# Patient Record
Sex: Female | Born: 1935
Health system: Southern US, Community
[De-identification: ages and names within clinical notes are randomized; demographics above are authoritative.]

## PROBLEM LIST (undated history)

## (undated) DIAGNOSIS — H353190 Nonexudative age-related macular degeneration, unspecified eye, stage unspecified: Secondary | ICD-10-CM

## (undated) DIAGNOSIS — G8929 Other chronic pain: Secondary | ICD-10-CM

## (undated) DIAGNOSIS — K529 Noninfective gastroenteritis and colitis, unspecified: Secondary | ICD-10-CM

## (undated) DIAGNOSIS — C3411 Malignant neoplasm of upper lobe, right bronchus or lung: Secondary | ICD-10-CM

## (undated) DIAGNOSIS — Z8669 Personal history of other diseases of the nervous system and sense organs: Secondary | ICD-10-CM

## (undated) DIAGNOSIS — I4891 Unspecified atrial fibrillation: Secondary | ICD-10-CM

## (undated) DIAGNOSIS — H269 Unspecified cataract: Secondary | ICD-10-CM

## (undated) DIAGNOSIS — M1990 Unspecified osteoarthritis, unspecified site: Secondary | ICD-10-CM

## (undated) DIAGNOSIS — M549 Dorsalgia, unspecified: Secondary | ICD-10-CM

## (undated) DIAGNOSIS — N200 Calculus of kidney: Secondary | ICD-10-CM

## (undated) DIAGNOSIS — Z860100 Personal history of colon polyps, unspecified: Secondary | ICD-10-CM

## (undated) DIAGNOSIS — E785 Hyperlipidemia, unspecified: Secondary | ICD-10-CM

## (undated) DIAGNOSIS — R195 Other fecal abnormalities: Secondary | ICD-10-CM

## (undated) DIAGNOSIS — E039 Hypothyroidism, unspecified: Secondary | ICD-10-CM

## (undated) HISTORY — PX: ESOPHAGOGASTRODUODENOSCOPY: SHX1529

## (undated) HISTORY — PX: COLONOSCOPY: SHX174

## (undated) HISTORY — PX: TONSILLECTOMY: SUR1361

## (undated) HISTORY — DX: Unspecified atrial fibrillation: I48.91

## (undated) HISTORY — PX: CATARACT EXTRACTION: SUR2

## (undated) HISTORY — DX: Calculus of kidney: N20.0

## (undated) HISTORY — DX: Other chronic pain: M54.9

## (undated) HISTORY — DX: Noninfective gastroenteritis and colitis, unspecified: K52.9

## (undated) HISTORY — DX: Other chronic pain: G89.29

## (undated) HISTORY — DX: Other fecal abnormalities: R19.5

## (undated) HISTORY — DX: Malignant neoplasm of upper lobe, right bronchus or lung: C34.11

## (undated) HISTORY — DX: Hypothyroidism, unspecified: E03.9

## (undated) HISTORY — DX: Unspecified cataract: H26.9

---

## 1959-05-16 HISTORY — PX: THYROID SURGERY: SHX805

## 1994-05-15 HISTORY — PX: OTHER SURGICAL HISTORY: SHX169

## 1997-10-15 ENCOUNTER — Other Ambulatory Visit: Admission: RE | Admit: 1997-10-15 | Discharge: 1997-10-15 | Payer: Self-pay | Admitting: Obstetrics and Gynecology

## 1998-11-11 ENCOUNTER — Other Ambulatory Visit: Admission: RE | Admit: 1998-11-11 | Discharge: 1998-11-11 | Payer: Self-pay | Admitting: Obstetrics and Gynecology

## 1999-09-26 ENCOUNTER — Encounter: Payer: Self-pay | Admitting: Obstetrics and Gynecology

## 1999-09-26 ENCOUNTER — Encounter: Admission: RE | Admit: 1999-09-26 | Discharge: 1999-09-26 | Payer: Self-pay | Admitting: Obstetrics and Gynecology

## 1999-10-03 ENCOUNTER — Other Ambulatory Visit: Admission: RE | Admit: 1999-10-03 | Discharge: 1999-10-03 | Payer: Self-pay | Admitting: Obstetrics and Gynecology

## 2000-10-02 ENCOUNTER — Other Ambulatory Visit: Admission: RE | Admit: 2000-10-02 | Discharge: 2000-10-02 | Payer: Self-pay | Admitting: Obstetrics and Gynecology

## 2000-10-04 ENCOUNTER — Encounter: Payer: Self-pay | Admitting: Obstetrics and Gynecology

## 2000-10-04 ENCOUNTER — Encounter: Admission: RE | Admit: 2000-10-04 | Discharge: 2000-10-04 | Payer: Self-pay | Admitting: Obstetrics and Gynecology

## 2004-06-15 ENCOUNTER — Ambulatory Visit: Payer: Self-pay | Admitting: Cardiology

## 2009-05-17 ENCOUNTER — Encounter: Payer: Self-pay | Admitting: Cardiology

## 2009-06-01 ENCOUNTER — Encounter: Payer: Self-pay | Admitting: Cardiology

## 2009-06-01 ENCOUNTER — Ambulatory Visit: Payer: Self-pay | Admitting: Cardiology

## 2009-06-15 ENCOUNTER — Encounter: Payer: Self-pay | Admitting: Cardiology

## 2009-07-27 ENCOUNTER — Encounter: Payer: Self-pay | Admitting: Cardiology

## 2009-07-29 ENCOUNTER — Ambulatory Visit: Payer: Self-pay | Admitting: Cardiology

## 2009-07-29 ENCOUNTER — Encounter (INDEPENDENT_AMBULATORY_CARE_PROVIDER_SITE_OTHER): Payer: Self-pay | Admitting: *Deleted

## 2009-07-29 DIAGNOSIS — I4891 Unspecified atrial fibrillation: Secondary | ICD-10-CM

## 2009-07-29 DIAGNOSIS — E079 Disorder of thyroid, unspecified: Secondary | ICD-10-CM | POA: Insufficient documentation

## 2009-07-29 DIAGNOSIS — R0602 Shortness of breath: Secondary | ICD-10-CM

## 2009-08-03 ENCOUNTER — Ambulatory Visit: Payer: Self-pay | Admitting: Cardiology

## 2009-08-03 LAB — CONVERTED CEMR LAB: POC INR: 1.7

## 2009-08-09 ENCOUNTER — Encounter: Payer: Self-pay | Admitting: Cardiology

## 2009-08-09 ENCOUNTER — Ambulatory Visit: Payer: Self-pay | Admitting: Cardiology

## 2009-08-10 ENCOUNTER — Ambulatory Visit: Payer: Self-pay | Admitting: Cardiology

## 2009-08-10 LAB — CONVERTED CEMR LAB: POC INR: 2.1

## 2009-08-16 ENCOUNTER — Encounter (INDEPENDENT_AMBULATORY_CARE_PROVIDER_SITE_OTHER): Payer: Self-pay | Admitting: *Deleted

## 2009-08-17 ENCOUNTER — Ambulatory Visit: Payer: Self-pay | Admitting: Cardiology

## 2009-08-17 LAB — CONVERTED CEMR LAB: POC INR: 2

## 2009-08-24 ENCOUNTER — Ambulatory Visit: Payer: Self-pay | Admitting: Cardiology

## 2009-08-24 LAB — CONVERTED CEMR LAB: POC INR: 2.2

## 2009-08-31 ENCOUNTER — Ambulatory Visit: Payer: Self-pay | Admitting: Cardiology

## 2009-08-31 LAB — CONVERTED CEMR LAB: POC INR: 2.5

## 2009-09-07 ENCOUNTER — Ambulatory Visit: Payer: Self-pay | Admitting: Cardiology

## 2009-09-07 ENCOUNTER — Encounter: Payer: Self-pay | Admitting: Cardiology

## 2009-09-07 LAB — CONVERTED CEMR LAB: POC INR: 2.7

## 2009-09-08 ENCOUNTER — Ambulatory Visit: Payer: Self-pay | Admitting: Cardiology

## 2009-09-13 ENCOUNTER — Telehealth (INDEPENDENT_AMBULATORY_CARE_PROVIDER_SITE_OTHER): Payer: Self-pay | Admitting: *Deleted

## 2009-09-14 ENCOUNTER — Ambulatory Visit: Payer: Self-pay | Admitting: Cardiology

## 2009-09-14 LAB — CONVERTED CEMR LAB: POC INR: 3

## 2009-09-15 ENCOUNTER — Ambulatory Visit: Payer: Self-pay | Admitting: Cardiology

## 2009-09-24 ENCOUNTER — Ambulatory Visit: Payer: Self-pay | Admitting: Cardiology

## 2009-09-24 ENCOUNTER — Encounter: Payer: Self-pay | Admitting: Cardiology

## 2009-09-24 LAB — CONVERTED CEMR LAB: POC INR: 2

## 2009-09-25 ENCOUNTER — Encounter: Payer: Self-pay | Admitting: Cardiology

## 2009-09-26 ENCOUNTER — Encounter: Payer: Self-pay | Admitting: Cardiology

## 2009-10-01 ENCOUNTER — Encounter: Payer: Self-pay | Admitting: Cardiology

## 2009-10-01 ENCOUNTER — Ambulatory Visit: Payer: Self-pay | Admitting: Cardiology

## 2009-10-01 LAB — CONVERTED CEMR LAB: POC INR: 2

## 2009-10-07 ENCOUNTER — Ambulatory Visit: Payer: Self-pay | Admitting: Cardiology

## 2009-10-15 ENCOUNTER — Ambulatory Visit: Payer: Self-pay | Admitting: Cardiology

## 2009-10-15 ENCOUNTER — Encounter: Payer: Self-pay | Admitting: Cardiology

## 2009-10-15 LAB — CONVERTED CEMR LAB: POC INR: 2.1

## 2009-10-22 ENCOUNTER — Ambulatory Visit: Payer: Self-pay | Admitting: Cardiology

## 2009-10-29 ENCOUNTER — Ambulatory Visit: Payer: Self-pay | Admitting: Cardiology

## 2009-11-02 ENCOUNTER — Encounter: Payer: Self-pay | Admitting: Cardiology

## 2009-11-02 ENCOUNTER — Encounter (INDEPENDENT_AMBULATORY_CARE_PROVIDER_SITE_OTHER): Payer: Self-pay | Admitting: *Deleted

## 2009-11-02 HISTORY — PX: CARDIOVERSION: SHX1299

## 2009-11-04 ENCOUNTER — Encounter: Payer: Self-pay | Admitting: Cardiology

## 2009-11-05 ENCOUNTER — Ambulatory Visit: Payer: Self-pay | Admitting: Cardiology

## 2009-11-05 ENCOUNTER — Encounter: Payer: Self-pay | Admitting: Cardiology

## 2009-11-05 LAB — CONVERTED CEMR LAB: POC INR: 2.9

## 2009-11-08 ENCOUNTER — Ambulatory Visit: Payer: Self-pay | Admitting: Cardiology

## 2009-11-10 ENCOUNTER — Encounter: Payer: Self-pay | Admitting: Cardiology

## 2009-11-12 ENCOUNTER — Encounter (INDEPENDENT_AMBULATORY_CARE_PROVIDER_SITE_OTHER): Payer: Self-pay | Admitting: *Deleted

## 2009-12-03 ENCOUNTER — Ambulatory Visit: Payer: Self-pay | Admitting: Cardiology

## 2009-12-03 LAB — CONVERTED CEMR LAB: POC INR: 2.2

## 2009-12-31 ENCOUNTER — Ambulatory Visit: Payer: Self-pay | Admitting: Cardiology

## 2010-01-14 ENCOUNTER — Ambulatory Visit: Payer: Self-pay | Admitting: Cardiology

## 2010-01-14 LAB — CONVERTED CEMR LAB: POC INR: 2.3

## 2010-01-25 ENCOUNTER — Ambulatory Visit: Payer: Self-pay | Admitting: Cardiology

## 2010-02-11 ENCOUNTER — Ambulatory Visit: Payer: Self-pay | Admitting: Cardiology

## 2010-02-11 LAB — CONVERTED CEMR LAB: POC INR: 3.3

## 2010-03-11 ENCOUNTER — Ambulatory Visit: Payer: Self-pay | Admitting: Cardiology

## 2010-03-11 LAB — CONVERTED CEMR LAB: POC INR: 1.7

## 2010-04-12 ENCOUNTER — Ambulatory Visit: Payer: Self-pay | Admitting: Cardiology

## 2010-05-10 ENCOUNTER — Ambulatory Visit: Payer: Self-pay | Admitting: Cardiology

## 2010-05-10 LAB — CONVERTED CEMR LAB: POC INR: 3.2

## 2010-06-07 ENCOUNTER — Ambulatory Visit: Admission: RE | Admit: 2010-06-07 | Discharge: 2010-06-07 | Payer: Self-pay | Source: Home / Self Care

## 2010-06-07 LAB — CONVERTED CEMR LAB: POC INR: 2.7

## 2010-06-16 NOTE — Medication Information (Signed)
Summary: ccr-lr  Anticoagulant Therapy  Managed by: Edrick Oh, RN PCP: Dr. Constance Holster MD: Dannielle Burn MD, Luvenia Heller Indication 1: Atrial Fibrillation Lab Used: [facility] [place] Site: [place] INR POC 3.2  Dietary changes: no    Health status changes: yes       Details: Had sinus infection  Bleeding/hemorrhagic complications: no    Recent/future hospitalizations: no    Any changes in medication regimen? yes       Details: Been on Doxycycline bid x 7 days  Finished 05/08/10  Recent/future dental: no  Any missed doses?: no       Is patient compliant with meds? yes       Allergies: 1)  Codeine  Anticoagulation Management History:      The patient is taking warfarin and comes in today for a routine follow up visit.  Warfarin therapy is being given due to Atrial Fibrillation.  Positive risk factors for bleeding include an age of 75 years or older.  Negative risk factors for bleeding include no history of CVA/TIA, no history of GI bleeding, and absence of serious comorbidities.  The bleeding index is 'intermediate risk'.  Positive CHADS2 values include Age > 32 years old.  Negative CHADS2 values include History of CHF, History of HTN, History of Diabetes, and Prior Stroke/CVA/TIA.  The start date was 07/27/2009.  Anticoagulation responsible provider: Dannielle Burn MD, Luvenia Heller.  INR POC: 3.2.  Cuvette Lot#: [id number].  Exp: 12/2010.    Anticoagulation Management Assessment/Plan:      The patient's current anticoagulation dose is Warfarin sodium 5 mg tabs: Use as directed by Anticoagulation Clinic.  The target INR is 2.0-3.0.  The next INR is due 06/07/2010.  Anticoagulation instructions were given to patient.  Results were reviewed/authorized by Edrick Oh, RN.  She was notified by Edrick Oh RN.         Prior Anticoagulation Instructions: INR 3.5 Hold coumadin tonight then resume 7.44m once daily   Current Anticoagulation Instructions: INR 3.2 Has been on Doxycycline Take coumadin  1/2 tablet tonight then resume 1 1/2 tablets once daily

## 2010-06-16 NOTE — Progress Notes (Signed)
Summary: HOME BP VISITS  HOME BP VISITS   Imported By: Gurney Maxin, RN, BSN 09/24/2009 08:44:59  _____________________________________________________________________  External Attachment:    Type:   Image     Comment:   External Document

## 2010-06-16 NOTE — Medication Information (Signed)
Summary: rov/sp  Anticoagulant Therapy  Managed by: Natalie Carlson, Natalie Carlson PCP: Natalie Carlson: Natalie Carlson, Natalie Carlson Indication 1: Atrial Fibrillation Lab Used: [facility] [place] Site: [place] INR POC 2.2  Dietary changes: no    Health status changes: no    Bleeding/hemorrhagic complications: no    Recent/future hospitalizations: no    Any changes in medication regimen? no    Recent/future dental: no  Any missed doses?: no       Is patient compliant with meds? yes       Allergies: 1)  Codeine  Anticoagulation Management History:      The patient is taking warfarin and comes in today for a routine follow up visit.  She is being anticoagulated due to Atrial Fibrillation.  Positive risk factors for bleeding include an age of 75 years or older.  Negative risk factors for bleeding include no history of CVA/TIA, no history of GI bleeding, and absence of serious comorbidities.  The bleeding index is 'intermediate risk'.  Positive CHADS2 values include Age > 75 years old.  Negative CHADS2 values include History of CHF, History of HTN, History of Diabetes, and Prior Stroke/CVA/TIA.  The start date was 07/27/2009.  Anticoagulation responsible provider: Dannielle Burn Carlson, Natalie Carlson.  INR POC: 2.2.  Cuvette Lot#: [id number].  Exp: 12/2010.    Anticoagulation Management Assessment/Plan:      The patient's current anticoagulation dose is Warfarin sodium 5 mg tabs: Use as directed by Anticoagulation Clinic.  The target INR is 2.0-3.0.  The next INR is due 12/31/2009.  Anticoagulation instructions were given to patient.  Results were reviewed/authorized by Natalie Carlson, Natalie Carlson.  She was notified by Natalie Carlson Natalie Carlson.         Prior Anticoagulation Instructions: INR 2.9  Decrease dose to 1 1/2 tablet every day except 1 tablet on Sunday and Wednesday.   Current Anticoagulation Instructions: INR 2.2 Continue coumadin 7.70m once daily except 57mon Sundays and Wednesdays

## 2010-06-16 NOTE — Medication Information (Signed)
Summary: ccr-lr  Anticoagulant Therapy  Managed by: Edrick Oh, RN PCP: Dr. Constance Holster MD: Dannielle Burn MD, Luvenia Heller Indication 1: Atrial Fibrillation Lab Used: [facility] [place] Site: [place] INR POC 2.0  Dietary changes: no    Health status changes: no    Bleeding/hemorrhagic complications: no    Recent/future hospitalizations: yes       Details: pending DCCV  Any changes in medication regimen? no    Recent/future dental: no  Any missed doses?: no       Is patient compliant with meds? yes       Allergies: 1)  Codeine  Anticoagulation Management History:      The patient is taking warfarin and comes in today for a routine follow up visit.  The patient is taking warfarin for Atrial Fibrillation.  Positive risk factors for bleeding include an age of 5 years or older.  Negative risk factors for bleeding include no history of CVA/TIA, no history of GI bleeding, and absence of serious comorbidities.  The bleeding index is 'intermediate risk'.  Positive CHADS2 values include Age > 36 years old.  Negative CHADS2 values include History of CHF, History of HTN, History of Diabetes, and Prior Stroke/CVA/TIA.  The start date was 07/27/2009.  Anticoagulation responsible provider: Dannielle Burn MD, Luvenia Heller.  INR POC: 2.0.  Cuvette Lot#: [id number].    Anticoagulation Management Assessment/Plan:      The patient's current anticoagulation dose is Warfarin sodium 5 mg tabs: Use as directed by Anticoagulation Clinic.  The target INR is 2.0-3.0.  The next INR is due 08/24/2009.  Anticoagulation instructions were given to patient.  Results were reviewed/authorized by Edrick Oh, RN.  She was notified by Edrick Oh RN.         Prior Anticoagulation Instructions: INR 2.1 Increase coumadin 64m once daily except 7.566mon Tuesdays and Saturdays  Current Anticoagulation Instructions: INR 2.0 Increase coumadin to 17m4mnce daily except 7.17mg13m T,Th,Sat Prescriptions: WARFARIN SODIUM 5 MG TABS  (WARFARIN SODIUM) Use as directed by Anticoagulation Clinic  #60 x 3   Entered by:   LisaEdrick Oh  Authorized by:   Guy Terald Sleeper, FACCSsm Health St. Louis University Hospitaligned by:   LisaEdrick Ohon 08/17/2009   Method used:   [facility]ronically to        [facility]       [address]   [place]  [id number]   Ph: [phone]   Fax: [phone]:   [phone]

## 2010-06-16 NOTE — Assessment & Plan Note (Signed)
Summary: follow up --agh   Visit Type:  Follow-up Primary Provider:  Dr. Eula Fried   History of Present Illness: the patient is an 77 year old female with a history of paroxysmal atrial fibrillation.  She was cardioverted a few weeks ago.  She only remained in sinus rhythm for two days.  During this time also she was taking verapamil 80 g twice a day associated with hypotension.  The patient declined to come to the emergency room and she cut back her verapamil to once a day.  On once a day verapamil  her blood pressure was stable.  the patient was started on flecainide 50 mg p.o. b.i.d. in anticipation of repeat cardioversion.  The plan was to increase her flecainide to hundred milligrams p.o. b.i.d. unfortunately during this time the patient stopped taking her verapamil and on not clear as to the reasons why.  When she increase her dose of flecainide for 50 to 100 mg twice a day she developed a fibrillation with rapid ventricular conduction and was admitted to the emergency room.  She was then started on short-acting low dose Cardizem 30 minutes p.o. b.i.d.  The patient stated she very uncomfortable in rapid atrial fibrillation.  Today the office however she feels better her heart rate is controlled anywhere from 50 to 60 beats/min remaining in atrial fibrillation.  The patient has remained compliant with warfarin therapy the meanwhile.  She denies any chest pain orthopnea or PND palpitations or syncope  Preventive Screening-Counseling & Management  Alcohol-Tobacco     Smoking Status: never  Current Medications (verified): 1)  Zetia 10 Mg Tabs (Ezetimibe) .... Take One Tablet By Mouth Daily. 2)  Medroxyprogesterone Acetate 2.5 Mg Tabs (Medroxyprogesterone Acetate) .... Take 1 Tablet By Mouth Once A Day 3)  Estropipate 0.75 Mg Tabs (Estropipate) .... Take 1 Tablet By Mouth Once A Day 4)  Aspirin 81 Mg Tbec (Aspirin) .... Take One Tablet By Mouth Daily 5)  Fish Oil Maximum Strength 1200 Mg Caps  (Omega-3 Fatty Acids) .... Take 1 Capsule By Mouth Two Times A Day 6)  Oyster-Cal 500 + D 500-125 Mg-Unit Tabs (Calcium-Vitamin D) .... Take 1 Tablet By Mouth Once A Day 7)  Red Yeast Rice Extract 600 Mg Caps (Red Yeast Rice Extract) .... Take 1 Tablet By Mouth Once A Day 8)  Xanax 0.5 Mg Tabs (Alprazolam) .... Take 1 Tab By Mouth At Bedtime 9)  Warfarin Sodium 5 Mg Tabs (Warfarin Sodium) .... Use As Directed By Anticoagulation Clinic 10)  Flecainide Acetate 50 Mg Tabs (Flecainide Acetate) .... Take 1 Tablet By Mouth Two Times A Day 11)  Cardizem 30 Mg Tabs (Diltiazem Hcl) .... Take 1 Tablet By Mouth Two Times A Day 12)  Digoxin 0.25 Mg Tabs (Digoxin) .... Take 1 Tablet By Mouth Once A Day  Allergies (verified): 1)  Codeine  Comments:  Nurse/Medical Assistant: The patient's medications and allergies were reviewed with the patient and were updated in the Medication and Allergy Lists. List reviewed.  Past History:  Past Surgical History: Last updated: 10/07/2009 Cataract Extraction  Family History: Last updated: 07/29/2009 patient has significant family history of stroke in mother and father as well as in two siblings.  Another sibling is a history of heart disease.  Social History: Last updated: 07/29/2009 the patient is retired from [employer].  She is widowed.  She does not smoke.  Past Medical History: thyroid disease bladder tack cataracts both eyes  Cardiolite stress study 2011 normal LV function and no ischemia  Review of Systems       The patient complains of shortness of breath.  The patient denies fatigue, malaise, fever, weight gain/loss, vision loss, decreased hearing, hoarseness, chest pain, palpitations, prolonged cough, wheezing, sleep apnea, coughing up blood, abdominal pain, blood in stool, nausea, vomiting, diarrhea, heartburn, incontinence, blood in urine, muscle weakness, joint pain, leg swelling, rash, skin lesions, headache, fainting, dizziness, depression,  anxiety, enlarged lymph nodes, easy bruising or bleeding, and environmental allergies.    Vital Signs:  Patient profile:   77 year old female Height:      63 inches Weight:      111 pounds O2 Sat:      98 % Pulse rate:   72 / minute BP sitting:   117 / 74  (left arm) Cuff size:   regular  Vitals Entered By: Georgina Peer (Oct 07, 2009 3:33 PM)  Physical Exam  Additional Exam:  General: Well-developed, well-nourished in no distress head: Normocephalic and atraumatic eyes PERRLA/EOMI intact, conjunctiva and lids normal nose: No deformity or lesions mouth normal dentition, normal posterior pharynx neck: Supple, no JVD.  No masses, thyromegaly or abnormal cervical nodes lungs: Normal breath sounds bilaterally without wheezing.  Normal percussion heart: regular rate and rhythm with normal S1 and S2, no S3 or S4.  PMI is normal.  No pathological murmurs abdomen: Normal bowel sounds, abdomen is soft and nontender without masses, organomegaly or hernias noted.  No hepatosplenomegaly musculoskeletal: Back normal, normal gait muscle strength and tone normal pulsus: Pulse is normal in all 4 extremities Extremities: No peripheral pitting edema neurologic: Alert and oriented x 3 skin: Intact without lesions or rashes cervical nodes: No significant adenopathy psychologic: Normal affect    EKG  Procedure date:  10/07/2009  Findings:      atrial fibrillation with slow ventricular response rightward axis.heart rate 57 beats/min  Impression & Recommendations:  Problem # 1:  ATRIAL FIBRILLATION (ICD-427.31) the patient is in chronic atrial fibrillation.  She was cardioverted but was not on flecainide at that time.  I discussed with the patient the risks and benefits of opting for a rate control strategy versus a rhythm control strategy.  The patient does be slightly more symptomatic in atrial fibrillation and she is willing to proceed with a second cardioversion on flecainide.  However she  declined to have her flecainide level increased and dosing of 100 mg p.o. b.i.d. Her updated medication list for this problem includes:    Aspirin 81 Mg Tbec (Aspirin) .Marland Kitchen... Take one tablet by mouth daily    Warfarin Sodium 5 Mg Tabs (Warfarin sodium) ..... Use as directed by anticoagulation clinic    Flecainide Acetate 50 Mg Tabs (Flecainide acetate) .Marland Kitchen... Take 1 tablet by mouth two times a day    Digoxin 0.25 Mg Tabs (Digoxin) .Marland Kitchen... Take 1 tablet by mouth once a day  Orders: EKG w/ Interpretation (93000)  Problem # 2:  COUMADIN THERAPY (ICD-V58.61) Coumadin will be continued and INR followed closely.  Problem # 3:  DYSPNEA (ICD-786.05) some element of dyspnea likely related to her underlying atrial fibrillation. The following medications were removed from the medication list:    Verapamil Hcl 80 Mg Tabs (Verapamil hcl) .Marland Kitchen... Take 1 tablet by mouth once a day Her updated medication list for this problem includes:    Aspirin 81 Mg Tbec (Aspirin) .Marland Kitchen... Take one tablet by mouth daily    Cardizem 30 Mg Tabs (Diltiazem hcl) .Marland Kitchen... Take 1 tablet by mouth two times a day  Digoxin 0.25 Mg Tabs (Digoxin) .Marland Kitchen... Take 1 tablet by mouth once a day  Patient Instructions: 1)  Cardioversion in 2 weeks 2)  Check PT x the next 2 weeks 3)  Follow up in  3 months.

## 2010-06-16 NOTE — Medication Information (Signed)
Summary: ccr-lr  Anticoagulant Therapy  Managed by: Edrick Oh, RN PCP: Dr. Constance Holster MD: Domenic Polite MD, Mikeal Hawthorne Indication 1: Atrial Fibrillation Lab Used: [facility] [place] Site: [place] INR POC 2.7  Dietary changes: no    Health status changes: no    Bleeding/hemorrhagic complications: no    Recent/future hospitalizations: no    Any changes in medication regimen? no    Recent/future dental: no  Any missed doses?: no       Is patient compliant with meds? yes       Allergies: 1)  Codeine  Anticoagulation Management History:      The patient is taking warfarin and comes in today for a routine follow up visit.  Warfarin therapy is being given due to Atrial Fibrillation.  Positive risk factors for bleeding include an age of 75 years or older.  Negative risk factors for bleeding include no history of CVA/TIA, no history of GI bleeding, and absence of serious comorbidities.  The bleeding index is 'intermediate risk'.  Positive CHADS2 values include Age > 31 years old.  Negative CHADS2 values include History of CHF, History of HTN, History of Diabetes, and Prior Stroke/CVA/TIA.  The start date was 07/27/2009.  Anticoagulation responsible provider: Domenic Polite MD, Mikeal Hawthorne.  INR POC: 2.7.  Cuvette Lot#: [id number].  Exp: 12/2010.    Anticoagulation Management Assessment/Plan:      The patient's current anticoagulation dose is Warfarin sodium 5 mg tabs: Use as directed by Anticoagulation Clinic.  The target INR is 2.0-3.0.  The next INR is due 07/05/2010.  Anticoagulation instructions were given to patient.  Results were reviewed/authorized by Edrick Oh, RN.  She was notified by Edrick Oh RN.         Prior Anticoagulation Instructions: INR 3.2 Has been on Doxycycline Take coumadin 1/2 tablet tonight then resume 1 1/2 tablets once daily   Current Anticoagulation Instructions: INR 2.7 Continue coumadin 7.63m once daily

## 2010-06-16 NOTE — Medication Information (Signed)
Summary: ccr-lr  Anticoagulant Therapy  Managed by: Edrick Oh, RN PCP: Dr. Constance Holster MD: Dannielle Burn MD, Luvenia Heller Indication 1: Atrial Fibrillation Lab Used: [facility] [place] Site: [place] INR POC 2.7  Dietary changes: no    Health status changes: no    Bleeding/hemorrhagic complications: no    Recent/future hospitalizations: yes       Details: pending DCCV 09/08/09  Any changes in medication regimen? no    Recent/future dental: no  Any missed doses?: no       Is patient compliant with meds? yes       Allergies: 1)  Codeine  Anticoagulation Management History:      The patient is taking warfarin and comes in today for a routine follow up visit.  The patient is taking warfarin for Atrial Fibrillation.  Positive risk factors for bleeding include an age of 75 years or older.  Negative risk factors for bleeding include no history of CVA/TIA, no history of GI bleeding, and absence of serious comorbidities.  The bleeding index is 'intermediate risk'.  Positive CHADS2 values include Age > 56 years old.  Negative CHADS2 values include History of CHF, History of HTN, History of Diabetes, and Prior Stroke/CVA/TIA.  The start date was 07/27/2009.  Anticoagulation responsible provider: Dannielle Burn MD, Luvenia Heller.  INR POC: 2.7.  Cuvette Lot#: [id number].    Anticoagulation Management Assessment/Plan:      The patient's current anticoagulation dose is Warfarin sodium 5 mg tabs: Use as directed by Anticoagulation Clinic.  The target INR is 2.0-3.0.  The next INR is due 09/14/2009.  Anticoagulation instructions were given to patient.  Results were reviewed/authorized by Edrick Oh, RN.  She was notified by Edrick Oh RN.         Prior Anticoagulation Instructions: INR 2.5 Continue coumadin 7.4m once daily except 574mon M,W,F  Current Anticoagulation Instructions: INR 2.7 Continue coumadin 7.98m59mnce daily except 98mg93m M,W,F DCCV scheduled for 09/08/09

## 2010-06-16 NOTE — Medication Information (Signed)
Summary: ccr-lr  Anticoagulant Therapy  Managed by: Edrick Oh, RN PCP: Dr. Constance Holster MD: Dannielle Burn MD, Luvenia Heller Indication 1: Atrial Fibrillation Lab Used: [facility] [place] Site: [place] INR POC 1.8  Dietary changes: no    Health status changes: no    Bleeding/hemorrhagic complications: no    Recent/future hospitalizations: no    Any changes in medication regimen? no    Recent/future dental: no  Any missed doses?: no       Is patient compliant with meds? yes       Allergies: 1)  Codeine  Anticoagulation Management History:      The patient is taking warfarin and comes in today for a routine follow up visit.  She is being anticoagulated due to Atrial Fibrillation.  Positive risk factors for bleeding include an age of 38 years or older.  Negative risk factors for bleeding include no history of CVA/TIA, no history of GI bleeding, and absence of serious comorbidities.  The bleeding index is 'intermediate risk'.  Positive CHADS2 values include Age > 71 years old.  Negative CHADS2 values include History of CHF, History of HTN, History of Diabetes, and Prior Stroke/CVA/TIA.  The start date was 07/27/2009.  Anticoagulation responsible provider: Dannielle Burn MD, Luvenia Heller.  INR POC: 1.8.  Cuvette Lot#: [id number].  Exp: 12/2010.    Anticoagulation Management Assessment/Plan:      The patient's current anticoagulation dose is Warfarin sodium 5 mg tabs: Use as directed by Anticoagulation Clinic.  The target INR is 2.0-3.0.  The next INR is due 01/14/2010.  Anticoagulation instructions were given to patient.  Results were reviewed/authorized by Edrick Oh, RN.  She was notified by Edrick Oh RN.         Prior Anticoagulation Instructions: INR 2.2 Continue coumadin 7.52m once daily except 582mon Sundays and Wednesdays  Current Anticoagulation Instructions: INR 1.8 Take coumadin 2 tablets tonight then increase dose to 1 1/2 tablets once daily

## 2010-06-16 NOTE — Medication Information (Signed)
Summary: [facility] POST CARDIOVERSION  Anticoagulant Therapy  Managed by: Porfirio Oar, PharmD PCP: Dr. Constance Holster MD: Dannielle Burn MD, Luvenia Heller Indication 1: Atrial Fibrillation Lab Used: [facility] [place] Site: [place] INR POC 2.9  Dietary changes: no    Health status changes: no    Bleeding/hemorrhagic complications: no    Recent/future hospitalizations: no    Any changes in medication regimen? yes       Details: off digoxin  Recent/future dental: no  Any missed doses?: no       Is patient compliant with meds? yes       Allergies: 1)  Codeine  Anticoagulation Management History:      The patient is taking warfarin and comes in today for a routine follow up visit.  The patient is on warfarin for Atrial Fibrillation.  Positive risk factors for bleeding include an age of 75 years or older.  Negative risk factors for bleeding include no history of CVA/TIA, no history of GI bleeding, and absence of serious comorbidities.  The bleeding index is 'intermediate risk'.  Positive CHADS2 values include Age > 39 years old.  Negative CHADS2 values include History of CHF, History of HTN, History of Diabetes, and Prior Stroke/CVA/TIA.  The start date was 07/27/2009.  Anticoagulation responsible provider: Dannielle Burn MD, Luvenia Heller.  INR POC: 2.9.  Cuvette Lot#: [id number].  Exp: 12/2010.    Anticoagulation Management Assessment/Plan:      The patient's current anticoagulation dose is Warfarin sodium 5 mg tabs: Use as directed by Anticoagulation Clinic.  The target INR is 2.0-3.0.  The next INR is due 12/03/2009.  Anticoagulation instructions were given to patient.  Results were reviewed/authorized by Porfirio Oar, PharmD.  She was notified by Porfirio Oar PharmD.         Prior Anticoagulation Instructions: INR 3.2 Pending DCCV .  Message to [facility] Dr Dorthula Perfect nurse that pt is ready to schedule Take coumadin 46m tonight then resume 7.532monce daily except 32m62mn Sundays  Current Anticoagulation  Instructions: INR 2.9  Decrease dose to 1 1/2 tablet every day except 1 tablet on Sunday and Wednesday.

## 2010-06-16 NOTE — Medication Information (Signed)
Summary: CCR  Anticoagulant Therapy  Managed by: Edrick Oh, RN PCP: Dr. Constance Holster MD: Dannielle Burn MD, Luvenia Heller Indication 1: Atrial Fibrillation Lab Used: [facility] [place] Site: [place] INR POC 2.1  Dietary changes: no    Health status changes: yes       Details: Feels groggy at times  Bleeding/hemorrhagic complications: no    Recent/future hospitalizations: no    Any changes in medication regimen? no    Recent/future dental: no  Any missed doses?: no       Is patient compliant with meds? yes       Allergies: 1)  Codeine  Anticoagulation Management History:      The patient is taking warfarin and comes in today for a routine follow up visit.  The patient is on warfarin for Atrial Fibrillation.  Positive risk factors for bleeding include an age of 75 years or older.  Negative risk factors for bleeding include no history of CVA/TIA, no history of GI bleeding, and absence of serious comorbidities.  The bleeding index is 'intermediate risk'.  Positive CHADS2 values include Age > 38 years old.  Negative CHADS2 values include History of CHF, History of HTN, History of Diabetes, and Prior Stroke/CVA/TIA.  The start date was 07/27/2009.  Anticoagulation responsible provider: Dannielle Burn MD, Luvenia Heller.  INR POC: 2.1.  Cuvette Lot#: [id number].    Anticoagulation Management Assessment/Plan:      The patient's current anticoagulation dose is Warfarin sodium 5 mg tabs: Use as directed by Anticoagulation Clinic.  The target INR is 2.0-3.0.  The next INR is due 10/22/2009.  Anticoagulation instructions were given to patient.  Results were reviewed/authorized by Edrick Oh, RN.  She was notified by Edrick Oh RN.         Prior Anticoagulation Instructions: INR 2.0 take coumadin 26m tonight then resume 7.539monce daily except 4m28mn Wednesdays  Current Anticoagulation Instructions: INR 2.1 Pt did not take couamdin as ordereed last check.  Thought it would make her blood too thin.  She  took 4mg16mce daily except 7/4mg 27mM,W,F.   Increase dose to 7.4mg o86m daily except 4mg on67mndays and Wednesdays  Appended Document: CCR Correction:  Pt took 7.4mg onc63maily except 4mg on M73mF last week.

## 2010-06-16 NOTE — Medication Information (Signed)
Summary: ccr-lr  Anticoagulant Therapy  Managed by: Edrick Oh, RN PCP: Dr. Constance Holster MD: Domenic Polite MD, Mikeal Hawthorne Indication 1: Atrial Fibrillation Lab Used: [facility] [place] Site: [place] INR POC 3.3  Dietary changes: no    Health status changes: no    Bleeding/hemorrhagic complications: no    Recent/future hospitalizations: no    Any changes in medication regimen? no    Recent/future dental: no  Any missed doses?: no       Is patient compliant with meds? yes       Allergies: 1)  Codeine  Anticoagulation Management History:      The patient is taking warfarin and comes in today for a routine follow up visit.  She is being anticoagulated because of Atrial Fibrillation.  Positive risk factors for bleeding include an age of 33 years or older.  Negative risk factors for bleeding include no history of CVA/TIA, no history of GI bleeding, and absence of serious comorbidities.  The bleeding index is 'intermediate risk'.  Positive CHADS2 values include Age > 63 years old.  Negative CHADS2 values include History of CHF, History of HTN, History of Diabetes, and Prior Stroke/CVA/TIA.  The start date was 07/27/2009.  Anticoagulation responsible provider: Domenic Polite MD, Mikeal Hawthorne.  INR POC: 3.3.  Cuvette Lot#: [id number].  Exp: 12/2010.    Anticoagulation Management Assessment/Plan:      The patient's current anticoagulation dose is Warfarin sodium 5 mg tabs: Use as directed by Anticoagulation Clinic.  The target INR is 2.0-3.0.  The next INR is due 03/11/2010.  Anticoagulation instructions were given to patient.  Results were reviewed/authorized by Edrick Oh, RN.  She was notified by Edrick Oh RN.         Prior Anticoagulation Instructions: INR 2.3 Continue coumadin 7.76m once daily   Current Anticoagulation Instructions: INR 3.3 Take coumadin 550mtonight then resume 7.76m64mnce daily  Increase salads/greens

## 2010-06-16 NOTE — Medication Information (Signed)
Summary: [facility]  Anticoagulant Therapy  Managed by: Lovina Reach, LPN PCP: Dr. Constance Holster MD: Dannielle Burn MD, Luvenia Heller Indication 1: Atrial Fibrillation Lab Used: [facility] [place] Site: [place] INR POC 3.2  Dietary changes: no    Health status changes: no    Bleeding/hemorrhagic complications: no    Recent/future hospitalizations: no    Any changes in medication regimen? no    Recent/future dental: no  Any missed doses?: no       Is patient compliant with meds? yes       Allergies: 1)  Codeine  Anticoagulation Management History:      The patient is taking warfarin and comes in today for a routine follow up visit.  The patient is on warfarin for Atrial Fibrillation.  Positive risk factors for bleeding include an age of 45 years or older.  Negative risk factors for bleeding include no history of CVA/TIA, no history of GI bleeding, and absence of serious comorbidities.  The bleeding index is 'intermediate risk'.  Positive CHADS2 values include Age > 76 years old.  Negative CHADS2 values include History of CHF, History of HTN, History of Diabetes, and Prior Stroke/CVA/TIA.  The start date was 07/27/2009.  Anticoagulation responsible provider: Dannielle Burn MD, Luvenia Heller.  INR POC: 2.1.  Cuvette Lot#: [id number].    Anticoagulation Management Assessment/Plan:      The patient's current anticoagulation dose is Warfarin sodium 5 mg tabs: Use as directed by Anticoagulation Clinic.  The target INR is 2.0-3.0.  The next INR is due 10/22/2009.  Anticoagulation instructions were given to patient.  Results were reviewed/authorized by Edrick Oh, RN.  She was notified by Lovina Reach, LPN.         Prior Anticoagulation Instructions: INR 2.1 Pt did not take couamdin as ordereed last check.  Thought it would make her blood too thin.  She took 66m once daily except 7/52mon M,W,F.   Increase dose to 7.37m59mnce daily except 37mg80m Sundays and Wednesdays  Current Anticoagulation  Instructions: INR 3.2 Continue coumadin 7.37mg 59me daily except 37mg o637mednesdays

## 2010-06-16 NOTE — Letter (Signed)
Summary: Cardioversion/TEE Cardioversion Letter  All     ,     Phone:   Fax:     11/02/2009 MRN: [id number]  TARIAH TRANSUE [address] [place], [place]  [address]    You are scheduled for a Cardioversion / TEE Cardioversion on Thursday, June 23 at 9:00 with Dr. Dannielle Burn.   Please arrive at [facility] at 7:00 a.m. / p.m. on the day of your procedure.  1)   DIET:  A)   Nothing to eat or drink after midnight except your medications with a sip of water.  B)   May have clear liquid breakfast, then nothing to eat or drink after _________ a.m. / p.m.      Clear liquids include:  water, broth, Sprite, Ginger Ale, black coffee, tea (no sugar),      cranberry / grape / apple juice, jello (not red), popsicle from clear juices (not red).  2)   Go to [facility] Hospital on _______________________ at ____________ a.m./p.m. for your pre-procedure visit.  3)   MAKE SURE YOU TAKE YOUR COUMADIN.  4)   A)   DO NOT TAKE these medications before your procedure:      ___________________________________________________________________     ___________________________________________________________________     ___________________________________________________________________  B)   YOU MAY TAKE ALL of your remaining medications with a small amount of water.    C)   START NEW medications:       ___________________________________________________________________     ___________________________________________________________________  5)   Must have a responsible person to drive you home.  6)   Bring a current list of your medications and current insurance cards.   * Special Note:  Every effort is made to have your procedure done on time. Occasionally there are emergencies that present themselves at the hospital that may cause delays. Please be patient if a delay does occur.  * If you have any questions after you get home, please call the office at [phone].

## 2010-06-16 NOTE — Medication Information (Signed)
Summary: ccr-lr  Anticoagulant Therapy  Managed by: Edrick Oh, RN PCP: Dr. Constance Holster MD: Domenic Polite MD, Mikeal Hawthorne Indication 1: Atrial Fibrillation Lab Used: [facility] [place] Site: [place] INR POC 3.0  Dietary changes: no    Health status changes: no    Bleeding/hemorrhagic complications: no    Recent/future hospitalizations: yes       Details: S/P DCCV 09/08/09  Any changes in medication regimen? no    Recent/future dental: no  Any missed doses?: no       Is patient compliant with meds? yes       Allergies: 1)  Codeine  Anticoagulation Management History:      The patient is taking warfarin and comes in today for a routine follow up visit.  The patient is taking warfarin for Atrial Fibrillation.  Positive risk factors for bleeding include an age of 75 years or older.  Negative risk factors for bleeding include no history of CVA/TIA, no history of GI bleeding, and absence of serious comorbidities.  The bleeding index is 'intermediate risk'.  Positive CHADS2 values include Age > 27 years old.  Negative CHADS2 values include History of CHF, History of HTN, History of Diabetes, and Prior Stroke/CVA/TIA.  The start date was 07/27/2009.  Anticoagulation responsible provider: Domenic Polite MD, Mikeal Hawthorne.  INR POC: 3.0.  Cuvette Lot#: [id number].    Anticoagulation Management Assessment/Plan:      The patient's current anticoagulation dose is Warfarin sodium 5 mg tabs: Use as directed by Anticoagulation Clinic.  The target INR is 2.0-3.0.  The next INR is due 09/24/2009.  Anticoagulation instructions were given to patient.  Results were reviewed/authorized by Edrick Oh, RN.  She was notified by Edrick Oh RN.        Coagulation management information includes:  08/03/09  S/P DCCV 09/08/09.  Prior Anticoagulation Instructions: INR 2.7 Continue coumadin 7.23m once daily except 516mon M,W,F DCCV scheduled for 09/08/09  Current Anticoagulation Instructions: INR 3.0 Take  coumadin 81m44monight then resume 7.81mg27mce daily except 81mg 72mM,W,F

## 2010-06-16 NOTE — Assessment & Plan Note (Signed)
Summary: ekg - agh  Nurse Visit  Comments Nurse visit in one week for EKG - if stll in atrial fib will need to increase Flecainide to 134m two times a day. Pt. instructed to make change as above.   This change is per last OV dictation.  Will have pt. come back in one week for another EKG. Pt. stated that she stopped her Verapamil herself on 5/6.  States she has felt alot bettter since. GLovina Reach LPN  May 13, [id number]    Allergies: 1)  Codeine  Orders Added: 1)  EKG w/ Interpretation [93000] Prescriptions: FLECAINIDE ACETATE 100 MG TABS (FLECAINIDE ACETATE) Take 1 tablet by mouth two times a day  #60 x 6   Entered by:   GLovina Reach LPN   Authorized by:   GTerald Sleeper MD, [facility]  Signed by:   GLovina Reach LPN on 016/55/3748  Method used:   [facility]ronically to        [facility]       [address]      [place]  [address]      Ph: [phone]      Fax: [phone]  RxID::   [id number]   Patient Instructions: 1)  Increase Flecainide to 1029mtwo times a day 2)  EKG next week - Thursday, 5/19 at 8:45 3)  Continue to monitor blood pressure.

## 2010-06-16 NOTE — Consult Note (Signed)
Summary: [place]  [place]   Imported By: Cher Nakai 10/07/2009 10:38:49  _____________________________________________________________________  External Attachment:    Type:   Image     Comment:   External Document

## 2010-06-16 NOTE — Medication Information (Signed)
Summary: ccr-lr  Anticoagulant Therapy  Managed by: Edrick Oh, RN PCP: Dr. Constance Holster MD: Dannielle Burn MD, Luvenia Heller Indication 1: Atrial Fibrillation Lab Used: [facility] [place] Site: [place] INR POC 2.1  Dietary changes: no    Health status changes: no    Bleeding/hemorrhagic complications: no    Recent/future hospitalizations: yes       Details: pending DCCV  Any changes in medication regimen? no    Recent/future dental: no  Any missed doses?: no       Is patient compliant with meds? yes       Allergies: 1)  Codeine  Anticoagulation Management History:      The patient is taking warfarin and comes in today for a routine follow up visit.  The patient is taking warfarin for Atrial Fibrillation.  Positive risk factors for bleeding include an age of 83 years or older.  Negative risk factors for bleeding include no history of CVA/TIA, no history of GI bleeding, and absence of serious comorbidities.  The bleeding index is 'intermediate risk'.  Positive CHADS2 values include Age > 1 years old.  Negative CHADS2 values include History of CHF, History of HTN, History of Diabetes, and Prior Stroke/CVA/TIA.  The start date was 07/27/2009.  Anticoagulation responsible provider: Dannielle Burn MD, Luvenia Heller.  INR POC: 2.1.    Anticoagulation Management Assessment/Plan:      The patient's current anticoagulation dose is Warfarin sodium 5 mg tabs: Use as directed by Anticoagulation Clinic.  The target INR is 2.0-3.0.  The next INR is due 08/17/2009.  Anticoagulation instructions were given to patient.  Results were reviewed/authorized by Edrick Oh, RN.  She was notified by Edrick Oh RN.         Prior Anticoagulation Instructions: INR 1.7 Increase coumadin to 27m once daily except 7.523mon Tuesdays  Current Anticoagulation Instructions: INR 2.1 Increase coumadin 33m101mnce daily except 7.33mg60m Tuesdays and Saturdays

## 2010-06-16 NOTE — Medication Information (Signed)
Summary: ccr-lr  Anticoagulant Therapy  Managed by: Edrick Oh, RN PCP: Dr. Constance Holster MD: Aundra Dubin MD, Kirk Ruths Indication 1: Atrial Fibrillation Lab Used: [facility] [place] Site: [place] INR POC 3.5  Dietary changes: no    Health status changes: no    Bleeding/hemorrhagic complications: no    Recent/future hospitalizations: no    Any changes in medication regimen? no    Recent/future dental: no  Any missed doses?: no       Is patient compliant with meds? yes       Allergies: 1)  Codeine  Anticoagulation Management History:      The patient is taking warfarin and comes in today for a routine follow up visit.  She is being anticoagulated because of Atrial Fibrillation.  Positive risk factors for bleeding include an age of 75 years or older.  Negative risk factors for bleeding include no history of CVA/TIA, no history of GI bleeding, and absence of serious comorbidities.  The bleeding index is 'intermediate risk'.  Positive CHADS2 values include Age > 75 years old.  Negative CHADS2 values include History of CHF, History of HTN, History of Diabetes, and Prior Stroke/CVA/TIA.  The start date was 07/27/2009.  Anticoagulation responsible Loura Pitt: Aundra Dubin MD, [place].  INR POC: 3.5.  Cuvette Lot#: [id number].  Exp: 12/2010.    Anticoagulation Management Assessment/Plan:      The patient's current anticoagulation dose is Warfarin sodium 5 mg tabs: Use as directed by Anticoagulation Clinic.  The target INR is 2.0-3.0.  The next INR is due 05/10/2010.  Anticoagulation instructions were given to patient.  Results were reviewed/authorized by Edrick Oh, RN.  She was notified by Edrick Oh RN.         Prior Anticoagulation Instructions: INR 1.7 Take coumadin 58m x 2 then resume 7.529monce daily   Current Anticoagulation Instructions: INR 3.5 Hold coumadin tonight then resume 7.28m63mnce daily

## 2010-06-16 NOTE — Letter (Signed)
Summary: Risk analyst at [facility]. [address]   [place], [place] [address]   Phone: [phone]  Fax: [phone]        November 12, 2009 MRN: [id number]   Natalie Carlson [address] [place], [place]  [address]   Dear Ms. [place] Bells,  Your test ordered by Rande Lawman has been reviewed by your physician (or physician assistant) and was found to be normal or stable. Your physician (or physician assistant) felt no changes were needed at this time.  ____ Echocardiogram  ____ Cardiac Stress Test  ____ Lab Work  ____ Peripheral vascular study of arms, legs or neck  ____ CT scan or X-ray  ____ Lung or Breathing test  __X__ Other:  holter monitor stable, normal sinus rhythm   Thank you.   Lovina Reach, LPN    Bryon Lions, M.D., F.A.C.C. Maceo Pro, M.D., F.A.C.C. Cammy Copa, M.D., F.A.C.C. Vonda Antigua, M.D., F.A.C.C. Vita Barley, M.D., F.A.C.C. Mare Ferrari, M.D., F.A.C.C. Emi Belfast, PA-C

## 2010-06-16 NOTE — Miscellaneous (Signed)
Summary: Orders Update - DCCV  Clinical Lists Changes  Orders: Added new Referral order of Cardioversion (Cardioversion) - Signed

## 2010-06-16 NOTE — Procedures (Signed)
Summary: Holter Monitor  Holter and Event/ CARDION[facility]   Imported By: Bartholomew Boards 11/11/2009 10:55:34  _____________________________________________________________________  External Attachment:    Type:   Image     Comment:   External Document  Appended Document: Holter and Event/ CARDIONET Notify patient Holter- stable NSR.   Appended Document: Holter and Event/ CARDIONET Patient notified by letter.

## 2010-06-16 NOTE — Medication Information (Signed)
Summary: ccr-lr  Anticoagulant Therapy  Managed by: Edrick Oh, RN PCP: Dr. Constance Holster MD: Domenic Polite MD, Mikeal Hawthorne Indication 1: Atrial Fibrillation Lab Used: [facility] [place] Site: [place] INR POC 2.3  Dietary changes: no    Health status changes: no    Bleeding/hemorrhagic complications: no    Recent/future hospitalizations: no    Any changes in medication regimen? no    Recent/future dental: no  Any missed doses?: no       Is patient compliant with meds? yes       Allergies: 1)  Codeine  Anticoagulation Management History:      The patient is taking warfarin and comes in today for a routine follow up visit.  The patient is on warfarin for Atrial Fibrillation.  Positive risk factors for bleeding include an age of 75 years or older.  Negative risk factors for bleeding include no history of CVA/TIA, no history of GI bleeding, and absence of serious comorbidities.  The bleeding index is 'intermediate risk'.  Positive CHADS2 values include Age > 71 years old.  Negative CHADS2 values include History of CHF, History of HTN, History of Diabetes, and Prior Stroke/CVA/TIA.  The start date was 07/27/2009.  Anticoagulation responsible provider: Domenic Polite MD, Mikeal Hawthorne.  INR POC: 2.3.    Anticoagulation Management Assessment/Plan:      The patient's current anticoagulation dose is Warfarin sodium 5 mg tabs: Use as directed by Anticoagulation Clinic.  The target INR is 2.0-3.0.  The next INR is due 10/29/2009.  Anticoagulation instructions were given to patient.  Results were reviewed/authorized by Edrick Oh, RN.  She was notified by Edrick Oh RN.         Prior Anticoagulation Instructions: INR 2.1 Pt did not take couamdin as ordereed last check.  Thought it would make her blood too thin.  She took 68m once daily except 7/516mon M,W,F.   Increase dose to 7.29m18mnce daily except 29mg57m Sundays and Wednesdays  Current Anticoagulation Instructions: INR 2.3 Increase  coumadin to 7.29mg 80me daily except 29mg o86mundays

## 2010-06-16 NOTE — Progress Notes (Signed)
Summary: PHONE: LOW BLOOD PRESSURE   Phone Note Call from Patient Call back at [facility] Phone [phone]   Caller: Patient Details for Reason: LOW BLOOD PRESSURE Summary of Call: Mrs. Leanos states that on Saturday she felt her BP dropped 88/50 and she became very fatigue and felt terrible. Refused to go to ER On 08-04-2022 she decided to decease her Verapamil 80 mg and she only took one pill on 2022/08/04. Today she feels better but concerned about her Bp and taking one Verapamil instead of 2 pills  Initial call taken by: Delfino Lovett,  Sep 13, 2009 9:45 AM  Follow-up for Phone Call        States she does feel a little better today, but pulse still feels floppy.  Breathing feels okay.  Will bring in for EKG on Wednesday, 5/4 at 8:45.   Has OV with GD for 5/11.   Lovina Reach, LPN  Sep 14, 8255 4:93 PM  discussed in the office.  Follow-up by: Terald Sleeper, MD, [facility],  Sep 19, 2009 4:20 PM  Additional Follow-up for Phone Call Additional follow up Details #1::        Dr. Dannielle Burn spoke with pt. at nurse visit.  See changes made on nurse/office visit. Lovina Reach, LPN  Sep 21, 5519 74:71 PM

## 2010-06-16 NOTE — Letter (Signed)
Summary: [place] D/C DR. Eula Fried  [place] D/C DR. Eula Fried   Imported By: Delfino Lovett 10/07/2009 10:43:07  _____________________________________________________________________  External Attachment:    Type:   Image     Comment:   External Document

## 2010-06-16 NOTE — Letter (Signed)
Summary: External Correspondence/ OFFICE VISIT DR. Marsa Aris  External Correspondence/ OFFICE VISIT DR. Marsa Aris   Imported By: Bartholomew Boards 07/28/2009 14:36:54  _____________________________________________________________________  External Attachment:    Type:   Image     Comment:   External Document

## 2010-06-16 NOTE — Assessment & Plan Note (Signed)
Summary: 48 hour holter  Nurse Visit   Visit Type:  nurse visit  CC:  48 hour holter.  CC: 48 hour holter Comments 48 hour holter monitor placed without difficulty. instructions given and patient verbalized understanding of plan.   Allergies: 1)  Codeine  Orders Added: 1)  Holter Monitor [Holter Monitor] 2)  Est. Patient Level I [27142]

## 2010-06-16 NOTE — Medication Information (Signed)
Summary: ccn - pend dccv for AF  --agh  Anticoagulant Therapy  Managed by: Edrick Oh, RN PCP: Dr. Eula Fried Supervising MD: Dannielle Burn MD, Luvenia Heller Indication 1: Atrial Fibrillation Lab Used: [facility] [place] Site: [place] INR POC 1.7  Dietary changes: no    Health status changes: yes       Details: new onset atrial fib at cataract surgery 12/11  Bleeding/hemorrhagic complications: no    Recent/future hospitalizations: no    Any changes in medication regimen? yes       Details: started on coumadin 34m qd on 07/27/09  Has 597mtablet  Recent/future dental: no  Any missed doses?: no       Is patient compliant with meds? yes      Comments: Coumadin teaching performed with pt and she verbalized understanding.  Allergies: 1)  Codeine  Anticoagulation Management History:      The patient comes in today for her initial visit for anticoagulation therapy.  The patient is taking warfarin for Atrial Fibrillation.  Positive risk factors for bleeding include an age of 6567ears or older.  Negative risk factors for bleeding include no history of CVA/TIA, no history of GI bleeding, and absence of serious comorbidities.  The bleeding index is 'intermediate risk'.  Positive CHADS2 values include Age > 7555ears old.  Negative CHADS2 values include History of CHF, History of HTN, History of Diabetes, and Prior Stroke/CVA/TIA.  The start date was 07/27/2009.  Anticoagulation responsible provider: DeDannielle BurnD, GuLuvenia Heller INR POC: 1.7.  Cuvette Lot#: [id number]   Anticoagulation Management Assessment/Plan:      The patient's current anticoagulation dose is Warfarin sodium 5 mg tabs: Use as directed by Anticoagulation Clinic.  The target INR is 2.0-3.0.  The next INR is due 08/10/2009.  Anticoagulation instructions were given to patient.  Results were reviewed/authorized by LiEdrick OhRN.  She was notified by LiEdrick OhN.        Coagulation management information includes:  08/03/09  Pending DCCV after  theraputic 4 weeks.  Current Anticoagulation Instructions: INR 1.7 Increase coumadin to 61m63mnce daily except 7.61mg39m Tuesdays

## 2010-06-16 NOTE — Miscellaneous (Signed)
Summary: Orders Update - BMET,PT/INR  Clinical Lists Changes  Orders: Added new Test order of T-Basic Metabolic Panel ([id number]) - Signed Added new Test order of T-Protime, Auto ([id number]) - Signed

## 2010-06-16 NOTE — Assessment & Plan Note (Signed)
Summary: office visit - atrial fib   Nurse Visit   Vital Signs:  Patient profile:   77 year old female Height:      63 inches Weight:      115 pounds Pulse rate:   84 / minute BP sitting:   123 / 88  (left arm) Cuff size:   regular  Vitals Entered By: Georgina Peer (Sep 15, 2009 8:57 AM)  Patient Instructions: 1)  Decrease Verapamil to 48m daily 2)  Flecainide 530mtwo times a day  3)  Nurse visit in one week for EKG - if stll in atrial fib will need to increase Flecainide to 10058mwo times a day  4)  Follow up in  Thursday, May 26 at 3:15   Visit Type:  ekg Primary Provider:  Dr. QurEula FriedC:  ekg.  History of Present Illness: the patient is a 79 1ar old female with a history of atrial fibrillation. She status post cardioversion performed last Wednesday. She remained in normal sinus rhythm for 2 days. On Friday she recalls while watching through the window at the hummingbirds, that her heart started suddenly racing and beating irregular. Since that day the patient has been feeling weak and drained. She reports decreased exercise tolerance. She denies however any chest pain. She also called in on Saturday atenolol blood pressure of 88/50 she felt very fatigued and felt terrible. At that time she was taking verapamil 80 mg twice a day. The patient declined to go to the emergency room and she cut back her verapamil to once a day. Since that time her blood pressure has remained stable. The patient does not want to cut out her verapamil altogether because it controls migraine headaches.  On EKG today the patient is in atrial fibrillation albeit with a relatively slow heart rate response of 61 beats per minute.   Impression & Recommendations:  Problem # 1:  ATRIAL FIBRILLATION (ICD-427.31) the patient has recurrent atrial fibrillation. I had a long discussion with the patient regarding the use of antiarrhythmic drug therapy. Dronaderone would be a good choice but in order to use  this drug we need to discontinue verapamil and the patient does not want to do this. Therefore the other option is to use flecainide, particularly because the patient has no history of coronary artery disease and had recently a negative Cardiolite stress study. She also has normal LV function. We will start her on 50 mg p.o. b.i.d. to be increased in one week if she remains in a fibrillation the 100 mg p.o. b.i.d. I will then see the patient back in followup and reschedule her for a elective cardioversion Her updated medication list for this problem includes:    Aspirin 81 Mg Tbec (Aspirin) ....Marland Kitchen Take one tablet by mouth daily    Warfarin Sodium 5 Mg Tabs (Warfarin sodium) ..... Use as directed by anticoagulation clinic    Flecainide Acetate 50 Mg Tabs (Flecainide acetate) ....Marland Kitchen Take 1 tablet by mouth two times a day  Orders: EKG w/ Interpretation (93000)  Problem # 2:  COUMADIN THERAPY (ICD-V58.61) Coumadin will need to be continued in the interim the patient was therapeutic yesterday with an INR of 3.0.  CC: ekg   Preventive Screening-Counseling & Management  Alcohol-Tobacco     Smoking Status: never  Current Medications (verified): 1)  Zetia 10 Mg Tabs (Ezetimibe) .... Take One Tablet By Mouth Daily. 2)  Verapamil Hcl 80 Mg Tabs (Verapamil Hcl) .... Take 1 Tablet By Mouth Once  A Day 3)  Medroxyprogesterone Acetate 2.5 Mg Tabs (Medroxyprogesterone Acetate) .... Take 1 Tablet By Mouth Once A Day 4)  Estropipate 0.75 Mg Tabs (Estropipate) .... Take 1 Tablet By Mouth Once A Day 5)  Aspirin 81 Mg Tbec (Aspirin) .... Take One Tablet By Mouth Daily 6)  Fish Oil Maximum Strength 1200 Mg Caps (Omega-3 Fatty Acids) .... Take 1 Capsule By Mouth Two Times A Day 7)  Oyster-Cal 500 + D 500-125 Mg-Unit Tabs (Calcium-Vitamin D) .... Take 1 Tablet By Mouth Once A Day 8)  Red Yeast Rice Extract 600 Mg Caps (Red Yeast Rice Extract) .... Take 1 Tablet By Mouth Once A Day 9)  Xanax 0.5 Mg Tabs  (Alprazolam) .... Take 1 Tab By Mouth At Bedtime 10)  Warfarin Sodium 5 Mg Tabs (Warfarin Sodium) .... Use As Directed By Anticoagulation Clinic 11)  Flecainide Acetate 50 Mg Tabs (Flecainide Acetate) .... Take 1 Tablet By Mouth Two Times A Day  Allergies (verified): 1)  Codeine  Comments:  Nurse/Medical Assistant: The patient's medications and allergies were reviewed with the patient and were updated in the Medication and Allergy Lists.  Orders Added: 1)  EKG w/ Interpretation [93000] Prescriptions: FLECAINIDE ACETATE 50 MG TABS (FLECAINIDE ACETATE) Take 1 tablet by mouth two times a day  #60 x 2   Entered by:   Lovina Reach, LPN   Authorized by:   Terald Sleeper, MD, [facility]   Signed by:   Lovina Reach, LPN on 28/78/6767   Method used:   Electronically to        [facility] (retail)       [address]       [place], [place]  [address]       Ph: [phone]       Fax: [phone]   RxID:   [id number]   Handout requested.    EKG  Procedure date:  09/15/2009  Findings:      atrial fibrillationwith heart rate 61 beats/min

## 2010-06-16 NOTE — Medication Information (Signed)
Summary: ccr-lr  Anticoagulant Therapy  Managed by: Edrick Oh, RN PCP: Dr. Constance Holster MD: Domenic Polite MD, Mikeal Hawthorne Indication 1: Atrial Fibrillation Lab Used: [facility] [place] Site: [place] INR POC 2.2  Dietary changes: no    Health status changes: no    Bleeding/hemorrhagic complications: no    Recent/future hospitalizations: yes       Details: pending DCCV  Any changes in medication regimen? no    Recent/future dental: no  Any missed doses?: no       Is patient compliant with meds? yes       Allergies: 1)  Codeine  Anticoagulation Management History:      The patient is taking warfarin and comes in today for a routine follow up visit.  The patient is taking warfarin for Atrial Fibrillation.  Positive risk factors for bleeding include an age of 17 years or older.  Negative risk factors for bleeding include no history of CVA/TIA, no history of GI bleeding, and absence of serious comorbidities.  The bleeding index is 'intermediate risk'.  Positive CHADS2 values include Age > 66 years old.  Negative CHADS2 values include History of CHF, History of HTN, History of Diabetes, and Prior Stroke/CVA/TIA.  The start date was 07/27/2009.  Anticoagulation responsible provider: Domenic Polite MD, Mikeal Hawthorne.  INR POC: 2.2.  Cuvette Lot#: [id number].    Anticoagulation Management Assessment/Plan:      The patient's current anticoagulation dose is Warfarin sodium 5 mg tabs: Use as directed by Anticoagulation Clinic.  The target INR is 2.0-3.0.  The next INR is due 08/31/2009.  Anticoagulation instructions were given to patient.  Results were reviewed/authorized by Edrick Oh, RN.  She was notified by Edrick Oh RN.         Prior Anticoagulation Instructions: INR 2.0 Increase coumadin to 51m once daily except 7.586mon T,Th,Sat  Current Anticoagulation Instructions: INR 2.2 Increase coumadin to 7.42m98mnce daily except 42mg22m M,W,F

## 2010-06-16 NOTE — Assessment & Plan Note (Signed)
Summary: EKG  Nurse Visit   Vital Signs:  Patient profile:   77 year old female Height:      63 inches Weight:      111 pounds O2 Sat:      98 % on Room air Pulse rate:   66 / minute BP sitting:   120 / 77  (left arm)  Vitals Entered By: Georgina Peer (November 08, 2009 8:49 AM)  O2 Flow:  Room air CC: walked into office to drop off monitor and states"I think Im back in a-fib" Comments c/o feeling a fullness in neck and chest and said felt like her heart would skip some beats    Visit Type:  nurse visit/ekg  CC:  walked into office to drop off monitor and states"I think Im back in a-fib".   Preventive Screening-Counseling & Management  Alcohol-Tobacco     Smoking Status: never  Current Medications (verified): 1)  Zetia 10 Mg Tabs (Ezetimibe) .... Take One Tablet By Mouth Daily. 2)  Medroxyprogesterone Acetate 2.5 Mg Tabs (Medroxyprogesterone Acetate) .... Take 1 Tablet By Mouth Once A Day 3)  Estropipate 0.75 Mg Tabs (Estropipate) .... Take 1 Tablet By Mouth Once A Day 4)  Aspirin 81 Mg Tbec (Aspirin) .... Take One Tablet By Mouth Daily 5)  Fish Oil Maximum Strength 1200 Mg Caps (Omega-3 Fatty Acids) .... Take 1 Capsule By Mouth Two Times A Day 6)  Oyster-Cal 500 + D 500-125 Mg-Unit Tabs (Calcium-Vitamin D) .... Take 1 Tablet By Mouth Once A Day 7)  Red Yeast Rice Extract 600 Mg Caps (Red Yeast Rice Extract) .... Take 1 Tablet By Mouth Once A Day 8)  Xanax 0.5 Mg Tabs (Alprazolam) .... Take 1 Tab By Mouth At Bedtime 9)  Warfarin Sodium 5 Mg Tabs (Warfarin Sodium) .... Use As Directed By Anticoagulation Clinic 10)  Flecainide Acetate 50 Mg Tabs (Flecainide Acetate) .... Take 1 Tablet By Mouth Two Times A Day 11)  Cardizem 30 Mg Tabs (Diltiazem Hcl) .... Take 1 Tablet By Mouth Two Times A Day  Allergies (verified): 1)  Codeine  Comments:  Nurse/Medical Assistant: The patient is currently on medications but does not know the name or dosage at this time. Instructed to  contact our office with details. Will update medication list at that time.  Orders Added: 1)  Est. Patient Level I [97741] 2)  EKG w/ Interpretation [42395]

## 2010-06-16 NOTE — Miscellaneous (Signed)
Summary: Orders Update - BMET,PT  Clinical Lists Changes  Problems: Added new problem of PRE-OPERATIVE CARDIOVASCULAR EXAMINATION (ICD-V72.81) Orders: Added new Test order of T-Basic Metabolic Panel ([id number]) - Signed Added new Test order of T-Protime, Auto ([id number]) - Signed

## 2010-06-16 NOTE — Medication Information (Signed)
Summary: ccr-lr  Anticoagulant Therapy  Managed by: Edrick Oh, RN PCP: Dr. Constance Holster MD: Domenic Polite MD, Mikeal Hawthorne Indication 1: Atrial Fibrillation Lab Used: [facility] [place] Site: [place] INR POC 2.3  Dietary changes: no    Health status changes: no    Bleeding/hemorrhagic complications: no    Recent/future hospitalizations: no    Any changes in medication regimen? no    Recent/future dental: no  Any missed doses?: no       Is patient compliant with meds? yes       Allergies: 1)  Codeine  Anticoagulation Management History:      The patient is taking warfarin and comes in today for a routine follow up visit.  She is being anticoagulated due to Atrial Fibrillation.  Positive risk factors for bleeding include an age of 75 years or older.  Negative risk factors for bleeding include no history of CVA/TIA, no history of GI bleeding, and absence of serious comorbidities.  The bleeding index is 'intermediate risk'.  Positive CHADS2 values include Age > 46 years old.  Negative CHADS2 values include History of CHF, History of HTN, History of Diabetes, and Prior Stroke/CVA/TIA.  The start date was 07/27/2009.  Anticoagulation responsible provider: Domenic Polite MD, Mikeal Hawthorne.  INR POC: 2.3.  Cuvette Lot#: [id number].  Exp: 12/2010.    Anticoagulation Management Assessment/Plan:      The patient's current anticoagulation dose is Warfarin sodium 5 mg tabs: Use as directed by Anticoagulation Clinic.  The target INR is 2.0-3.0.  The next INR is due 02/11/2010.  Anticoagulation instructions were given to patient.  Results were reviewed/authorized by Edrick Oh, RN.  She was notified by Edrick Oh RN.         Prior Anticoagulation Instructions: INR 1.8 Take coumadin 2 tablets tonight then increase dose to 1 1/2 tablets once daily   Current Anticoagulation Instructions: INR 2.3 Continue coumadin 7.9m once daily

## 2010-06-16 NOTE — Medication Information (Signed)
Summary: ccr-lr  Anticoagulant Therapy  Managed by: Edrick Oh, RN PCP: Dr. Constance Holster MD: Dannielle Burn MD, Luvenia Heller Indication 1: Atrial Fibrillation Lab Used: [facility] [place] Site: [place] INR POC 2.0  Dietary changes: no    Health status changes: no    Bleeding/hemorrhagic complications: no    Recent/future hospitalizations: yes       Details: Was at [facility] 5/21 - 5/22 for a fib  Any changes in medication regimen? no    Recent/future dental: no  Any missed doses?: yes     Details: Only received 1 tablet Sat night in the hospital rather than 1 1/2 tab  Is patient compliant with meds? yes       Allergies: 1)  Codeine  Anticoagulation Management History:      The patient is taking warfarin and comes in today for a routine follow up visit.  The patient is on warfarin for Atrial Fibrillation.  Positive risk factors for bleeding include an age of 75 years or older.  Negative risk factors for bleeding include no history of CVA/TIA, no history of GI bleeding, and absence of serious comorbidities.  The bleeding index is 'intermediate risk'.  Positive CHADS2 values include Age > 75 years old.  Negative CHADS2 values include History of CHF, History of HTN, History of Diabetes, and Prior Stroke/CVA/TIA.  The start date was 07/27/2009.  Anticoagulation responsible provider: Dannielle Burn MD, Luvenia Heller.  INR POC: 2.0.  Cuvette Lot#: [id number].    Anticoagulation Management Assessment/Plan:      The patient's current anticoagulation dose is Warfarin sodium 5 mg tabs: Use as directed by Anticoagulation Clinic.  The target INR is 2.0-3.0.  The next INR is due 10/08/2009.  Anticoagulation instructions were given to patient.  Results were reviewed/authorized by Edrick Oh, RN.  She was notified by Edrick Oh RN.         Prior Anticoagulation Instructions: INR 2.0 Increase coumadin to 7.66m once daily except 570mon Wednesdays  Current Anticoagulation Instructions: INR 2.0 take coumadin 1026mtonight then resume 7.5mg61mce daily except 5mg 108mWednesdays

## 2010-06-16 NOTE — Medication Information (Signed)
Summary: ccr-lr  Anticoagulant Therapy  Managed by: Edrick Oh, RN PCP: Dr. Constance Holster MD: Ron Parker MD, Dellis Filbert Indication 1: Atrial Fibrillation Lab Used: [facility] [place] Site: [place] INR POC 1.7  Dietary changes: no    Health status changes: no    Bleeding/hemorrhagic complications: no    Recent/future hospitalizations: no    Any changes in medication regimen? no    Recent/future dental: no  Any missed doses?: no       Is patient compliant with meds? yes       Allergies: 1)  Codeine  Anticoagulation Management History:      The patient is taking warfarin and comes in today for a routine follow up visit.  She is being anticoagulated because of Atrial Fibrillation.  Positive risk factors for bleeding include an age of 67 years or older.  Negative risk factors for bleeding include no history of CVA/TIA, no history of GI bleeding, and absence of serious comorbidities.  The bleeding index is 'intermediate risk'.  Positive CHADS2 values include Age > 59 years old.  Negative CHADS2 values include History of CHF, History of HTN, History of Diabetes, and Prior Stroke/CVA/TIA.  The start date was 07/27/2009.  Anticoagulation responsible provider: Ron Parker MD, Dellis Filbert.  INR POC: 1.7.  Cuvette Lot#: [id number].  Exp: 12/2010.    Anticoagulation Management Assessment/Plan:      The patient's current anticoagulation dose is Warfarin sodium 5 mg tabs: Use as directed by Anticoagulation Clinic.  The target INR is 2.0-3.0.  The next INR is due 04/05/2010.  Anticoagulation instructions were given to patient.  Results were reviewed/authorized by Edrick Oh, RN.  She was notified by Edrick Oh RN.         Prior Anticoagulation Instructions: INR 3.3 Take coumadin 61m tonight then resume 7.537monce daily  Increase salads/greens  Current Anticoagulation Instructions: INR 1.7 Take coumadin 1053m 2 then resume 7.5mg72mce daily

## 2010-06-16 NOTE — Medication Information (Signed)
Summary: ccr-lr  Anticoagulant Therapy  Managed by: Edrick Oh, RN PCP: Dr. Constance Holster MD: Ron Parker MD, Dellis Filbert Indication 1: Atrial Fibrillation Lab Used: [facility] [place] Site: [place] INR POC 2.0  Dietary changes: no    Health status changes: no    Bleeding/hemorrhagic complications: no    Recent/future hospitalizations: yes       Details: pending DCCV  Any changes in medication regimen? yes       Details: started on flecanide 47m bid  Recent/future dental: no  Any missed doses?: no       Is patient compliant with meds? yes       Allergies: 1)  Codeine  Anticoagulation Management History:      The patient is taking warfarin and comes in today for a routine follow up visit.  The patient is on warfarin for Atrial Fibrillation.  Positive risk factors for bleeding include an age of 642years or older.  Negative risk factors for bleeding include no history of CVA/TIA, no history of GI bleeding, and absence of serious comorbidities.  The bleeding index is 'intermediate risk'.  Positive CHADS2 values include Age > 751years old.  Negative CHADS2 values include History of CHF, History of HTN, History of Diabetes, and Prior Stroke/CVA/TIA.  The start date was 07/27/2009.  Anticoagulation responsible provider: KRon ParkerMD, JDellis Filbert  INR POC: 2.0.  Cuvette Lot#: [id number]    Anticoagulation Management Assessment/Plan:      The patient's current anticoagulation dose is Warfarin sodium 5 mg tabs: Use as directed by Anticoagulation Clinic.  The target INR is 2.0-3.0.  The next INR is due 10/01/2009.  Anticoagulation instructions were given to patient.  Results were reviewed/authorized by LEdrick Oh RN.  She was notified by LEdrick OhRN.        Coagulation management information includes:  08/03/09  S/P DCCV 09/08/09   09/24/09 Started on flecanide.  Pending repeat DCCV.  Prior Anticoagulation Instructions: INR 3.0 Take coumadin 561mtonight then resume 7.40m70mnce daily except  40mg41m M,W,F   Current Anticoagulation Instructions: INR 2.0 Increase coumadin to 7.40mg 68me daily except 40mg o37mednesdays

## 2010-06-16 NOTE — Assessment & Plan Note (Signed)
Summary: 6 wk fu -srs   Visit Type:  Follow-up Primary Provider:  Dr. Eula Fried   History of Present Illness: the patient is a 77 year old female with a history of presumed recent onset atrial fibrillation. She has now been 5 weeks on Coumadin. She has remained therapeutic. She remains in atrial fibrillation her heart rate is well controlled. The patient could not tolerate increased dose ofverapamil due to dizziness and hypotension. She does tend to run a low blood pressure. She also had a stress test done which showed no ischemia. She denies any other cardiovascular symptoms. Denies any orthopnea PND. Her INR level was 2.7 today. Her EKG was reviewed and the patient remains in coarse atrial fibrillation   Preventive Screening-Counseling & Management  Alcohol-Tobacco     Smoking Status: never  Current Problems (verified): 1)  Coumadin Therapy  (ICD-V58.61) 2)  Dyspnea  (ICD-786.05) 3)  Thyroid Disorder  (ICD-246.9) 4)  Atrial Fibrillation  (ICD-427.31)  Current Medications (verified): 1)  Zetia 10 Mg Tabs (Ezetimibe) .... Take One Tablet By Mouth Daily. 2)  Verapamil Hcl 80 Mg Tabs (Verapamil Hcl) .... Take 1 Tablet By Mouth Two Times A Day 3)  Medroxyprogesterone Acetate 2.5 Mg Tabs (Medroxyprogesterone Acetate) .... Take 1 Tablet By Mouth Once A Day 4)  Estropipate 0.75 Mg Tabs (Estropipate) .... Take 1 Tablet By Mouth Once A Day 5)  Aspirin 81 Mg Tbec (Aspirin) .... Take One Tablet By Mouth Daily 6)  Fish Oil Maximum Strength 1200 Mg Caps (Omega-3 Fatty Acids) .... Take 1 Capsule By Mouth Two Times A Day 7)  Oyster-Cal 500 + D 500-125 Mg-Unit Tabs (Calcium-Vitamin D) .... Take 1 Tablet By Mouth Once A Day 8)  Red Yeast Rice Extract 600 Mg Caps (Red Yeast Rice Extract) .... Take 1 Tablet By Mouth Once A Day 9)  Xanax 0.5 Mg Tabs (Alprazolam) .... Take 1 Tab By Mouth At Bedtime 10)  Warfarin Sodium 5 Mg Tabs (Warfarin Sodium) .... Use As Directed By Anticoagulation  Clinic  Allergies: 1)  Codeine  Comments:  Nurse/Medical Assistant: The patient's medications were reviewed with the patient and were updated in the Medication List. Pt states she decreased her Verapamil to two times a day from three times a day on her own d/t feeling like she was going to pass out and low BP. Gurney Maxin, RN, BSN (September 07, 2009 12:52 PM)  Past History:  Past Medical History: Last updated: 07/29/2009 thyroid disease bladder tack cataracts both eyes  Family History: Last updated: 07/29/2009 patient has significant family history of stroke in mother and father as well as in two siblings.  Another sibling is a history of heart disease.  Social History: Last updated: 07/29/2009 the patient is retired from [employer].  She is widowed.  She does not smoke.  Risk Factors: Smoking Status: never (09/07/2009)  Review of Systems  The patient denies fatigue, malaise, fever, weight gain/loss, vision loss, decreased hearing, hoarseness, chest pain, palpitations, shortness of breath, prolonged cough, wheezing, sleep apnea, coughing up blood, abdominal pain, blood in stool, nausea, vomiting, diarrhea, heartburn, incontinence, blood in urine, muscle weakness, joint pain, leg swelling, rash, skin lesions, headache, fainting, dizziness, depression, anxiety, enlarged lymph nodes, easy bruising or bleeding, and environmental allergies.    Vital Signs:  Patient profile:   77 year old female Height:      63 inches Weight:      115 pounds Pulse rate:   89 / minute BP sitting:   94 / 59  (  left arm) Cuff size:   regular  Vitals Entered By: Gurney Maxin, RN, BSN (September 07, 2009 12:50 PM) Comments Follow up office visit. Pt states she has been feeling pretty good.   Physical Exam  Additional Exam:  General: Well-developed, well-nourished in no distress head: Normocephalic and atraumatic eyes PERRLA/EOMI intact, conjunctiva and lids normal nose: No deformity or  lesions mouth normal dentition, normal posterior pharynx neck: Supple, no JVD.  No masses, thyromegaly or abnormal cervical nodes lungs: Normal breath sounds bilaterally without wheezing.  Normal percussion heart: regular rate and rhythm with normal S1 and S2, no S3 or S4.  PMI is normal.  No pathological murmurs abdomen: Normal bowel sounds, abdomen is soft and nontender without masses, organomegaly or hernias noted.  No hepatosplenomegaly musculoskeletal: Back normal, normal gait muscle strength and tone normal pulsus: Pulse is normal in all 4 extremities Extremities: No peripheral pitting edema neurologic: Alert and oriented x 3 skin: Intact without lesions or rashes cervical nodes: No significant adenopathy psychologic: Normal affect    EKG  Procedure date:  09/07/2009  Findings:      course atrial fibrillation with right axis. Heart rate 86 beats per minute  Nuclear ETT  Procedure date:  08/09/2009  Findings:      normal perfusion study. No ischemia or infarction. Normal LV function ejection fraction 79%  Impression & Recommendations:  Problem # 1:  ATRIAL FIBRILLATION (ICD-427.31) patient remained in atrial fibrillation. She has been on Coumadin in the therapeutic range for now 5 weeks. The patient wants to schedule her cardioversion tomorrow. I discussed the risk and benefits of the procedure with the patient. I asked her to take her aspirin as well as one dose of her verapamil early in the morning with a sip of water but otherwise remain n.p.o. Her updated medication list for this problem includes:    Aspirin 81 Mg Tbec (Aspirin) .Marland Kitchen... Take one tablet by mouth daily    Warfarin Sodium 5 Mg Tabs (Warfarin sodium) ..... Use as directed by anticoagulation clinic  Orders: EKG w/ Interpretation (93000)  Problem # 2:  DYSPNEA (ICD-786.05) improved. No evidence of ischemic heart disease. Cardiolite study was within normal limits. Her updated medication list for this  problem includes:    Verapamil Hcl 80 Mg Tabs (Verapamil hcl) .Marland Kitchen... Take 1 tablet by mouth two times a day    Aspirin 81 Mg Tbec (Aspirin) .Marland Kitchen... Take one tablet by mouth daily  Problem # 3:  COUMADIN THERAPY (ICD-V58.61) INR 2.7 today

## 2010-06-16 NOTE — Assessment & Plan Note (Signed)
Summary: eph-post cardioversion   Visit Type:  Follow-up Primary Provider:  Dr. Eula Fried   History of Present Illness: the patient is a 77 year old female with a history of paroxysmal atrial fibrillation. She has remained in normal sinus rhythm on flecainide therapy. She states she is doing very well. She denies any chest pain shortness of breath orthopnea PND. She has no palpitations or syncope. She remains very active and works in her 2 acre yard. She denies any complications related to her flecainide therapy. She's also compliant on Coumadin therapy and her INR has been therapeutic.  Preventive Screening-Counseling & Management  Alcohol-Tobacco     Smoking Status: never  Current Medications (verified): 1)  Medroxyprogesterone Acetate 2.5 Mg Tabs (Medroxyprogesterone Acetate) .... Take 1 Tablet By Mouth Once A Day 2)  Estropipate 0.75 Mg Tabs (Estropipate) .... Take 1 Tablet By Mouth Once A Day 3)  Aspirin 81 Mg Tbec (Aspirin) .... Take One Tablet By Mouth Daily 4)  Fish Oil Maximum Strength 1200 Mg Caps (Omega-3 Fatty Acids) .... Take 1 Capsule By Mouth Two Times A Day 5)  Oyster-Cal 500 + D 500-125 Mg-Unit Tabs (Calcium-Vitamin D) .... Take 1 Tablet By Mouth Once A Day 6)  Red Yeast Rice Extract 600 Mg Caps (Red Yeast Rice Extract) .... Take 1 Tablet By Mouth Once A Day 7)  Xanax 0.5 Mg Tabs (Alprazolam) .... Take 1 Tab By Mouth At Bedtime 8)  Warfarin Sodium 5 Mg Tabs (Warfarin Sodium) .... Use As Directed By Anticoagulation Clinic 9)  Flecainide Acetate 50 Mg Tabs (Flecainide Acetate) .... Take 1 Tablet By Mouth Two Times A Day 10)  Cardizem 30 Mg Tabs (Diltiazem Hcl) .... Take 1 Tablet By Mouth Two Times A Day 11)  Restora  Caps (Probiotic Product) .... Take 1 Once Daily 12)  Chlordiazepoxide Hcl 10 Mg Caps (Chlordiazepoxide Hcl) .... Take 1 Tablet By Mouth Two Times A Day  Allergies (verified): 1)  Codeine  Comments:  Nurse/Medical Assistant: The patient's medication list  and allergies were reviewed with the patient and were updated in the Medication and Allergy Lists.  Past History:  Past Surgical History: Last updated: 10/07/2009 Cataract Extraction  Family History: Last updated: 07/29/2009 patient has significant family history of stroke in mother and father as well as in two siblings.  Another sibling is a history of heart disease.  Social History: Last updated: 07/29/2009 the patient is retired from [employer].  She is widowed.  She does not smoke.  Risk Factors: Smoking Status: never (01/25/2010)  Past Medical History: thyroid disease bladder tack cataracts both eyes  Cardiolite stress study 2011 normal LV function and no ischemia Holter monitor June 2011--normal sinus rhythm on flecainide therapy  Review of Systems  The patient denies fatigue, malaise, fever, weight gain/loss, vision loss, decreased hearing, hoarseness, chest pain, palpitations, shortness of breath, prolonged cough, wheezing, sleep apnea, coughing up blood, abdominal pain, blood in stool, nausea, vomiting, diarrhea, heartburn, incontinence, blood in urine, muscle weakness, joint pain, leg swelling, rash, skin lesions, headache, fainting, dizziness, depression, anxiety, enlarged lymph nodes, easy bruising or bleeding, and environmental allergies.    Vital Signs:  Patient profile:   77 year old female Height:      63 inches Weight:      116 pounds Pulse rate:   68 / minute BP sitting:   122 / 80  (left arm) Cuff size:   regular  Vitals Entered By: Georgina Peer (January 25, 2010 9:19 AM)  Physical Exam  Additional Exam:  General: Well-developed, well-nourished in no distress head: Normocephalic and atraumatic eyes PERRLA/EOMI intact, conjunctiva and lids normal nose: No deformity or lesions mouth normal dentition, normal posterior pharynx neck: Supple, no JVD.  No masses, thyromegaly or abnormal cervical nodes lungs: Normal breath sounds bilaterally without  wheezing.  Normal percussion heart: regular rate and rhythm with normal S1 and S2, no S3 or S4.  PMI is normal.  No pathological murmurs abdomen: Normal bowel sounds, abdomen is soft and nontender without masses, organomegaly or hernias noted.  No hepatosplenomegaly musculoskeletal: Back normal, normal gait muscle strength and tone normal pulsus: Pulse is normal in all 4 extremities Extremities: No peripheral pitting edema neurologic: Alert and oriented x 3 skin: Intact without lesions or rashes cervical nodes: No significant adenopathy psychologic: Normal affect    Impression & Recommendations:  Problem # 1:  COUMADIN THERAPY (ICD-V58.61) followed in the Coumadin clinic. No complications  Problem # 2:  ATRIAL FIBRILLATION (ICD-427.31) the patient remained in normal sinus rhythm. She had a Holter monitor done in June of 2011 and was found to be normal sinus rhythm. Her updated medication list for this problem includes:    Aspirin 81 Mg Tbec (Aspirin) .Marland Kitchen... Take one tablet by mouth daily    Warfarin Sodium 5 Mg Tabs (Warfarin sodium) ..... Use as directed by anticoagulation clinic    Flecainide Acetate 50 Mg Tabs (Flecainide acetate) .Marland Kitchen... Take 1 tablet by mouth two times a day  Patient Instructions: 1)  Your physician recommends that you continue on your current medications as directed. Please refer to the Current Medication list given to you today. 2)  Follow up in  6 months

## 2010-06-16 NOTE — Progress Notes (Signed)
Summary: Office Visit/ BLOOD PRESSURE READINGS  Office Visit/ BLOOD PRESSURE READINGS   Imported By: Bartholomew Boards 10/01/2009 16:09:50  _____________________________________________________________________  External Attachment:    Type:   Image     Comment:   External Document

## 2010-06-16 NOTE — Medication Information (Signed)
Summary: ccr-lr  Anticoagulant Therapy  Managed by: Edrick Oh, RN PCP: Dr. Constance Holster MD: Percival Spanish MD, Jeneen Rinks Indication 1: Atrial Fibrillation Lab Used: [facility] [place] Site: [place] INR POC 2.5  Dietary changes: no    Health status changes: no    Bleeding/hemorrhagic complications: no    Recent/future hospitalizations: yes       Details: pending DCCV  Any changes in medication regimen? no    Recent/future dental: no  Any missed doses?: no       Is patient compliant with meds? yes       Allergies: 1)  Codeine  Anticoagulation Management History:      The patient is taking warfarin and comes in today for a routine follow up visit.  The patient is taking warfarin for Atrial Fibrillation.  Positive risk factors for bleeding include an age of 74 years or older.  Negative risk factors for bleeding include no history of CVA/TIA, no history of GI bleeding, and absence of serious comorbidities.  The bleeding index is 'intermediate risk'.  Positive CHADS2 values include Age > 65 years old.  Negative CHADS2 values include History of CHF, History of HTN, History of Diabetes, and Prior Stroke/CVA/TIA.  The start date was 07/27/2009.  Anticoagulation responsible provider: Percival Spanish MD, Jeneen Rinks.  INR POC: 2.5.  Cuvette Lot#: [id number].    Anticoagulation Management Assessment/Plan:      The patient's current anticoagulation dose is Warfarin sodium 5 mg tabs: Use as directed by Anticoagulation Clinic.  The target INR is 2.0-3.0.  The next INR is due 09/07/2009.  Anticoagulation instructions were given to patient.  Results were reviewed/authorized by Edrick Oh, RN.  She was notified by Edrick Oh RN.         Prior Anticoagulation Instructions: INR 2.2 Increase coumadin to 7.79m once daily except 524mon M,W,F  Current Anticoagulation Instructions: INR 2.5 Continue coumadin 7.26m41mnce daily except 26mg34m M,W,F

## 2010-06-16 NOTE — Letter (Signed)
Summary: Adult nurse at [facility]. [address]   [place], [place] [address]   Phone: [phone]  Fax: [phone]      [facility] Cardiovascular Services  Cardiolite Stress Test     [facility]  Your doctor has ordered a Cardiolite Stress Test to help determine the condition of your heart during stress. If you take blood pressure medicine, ask your doctor if you should take it the day of your test. You should not have anything to eat or drink at least 4 hours before your test is scheduled, and no caffeine (coffee, tea, decaf. or chocolate) for 24 hours before your test.   You will need to register at the Outpatient/Main Entrance at the hospital 30 minutes before your appointment time. It is a good idea to bring a copy of your order with you. They will direct you to the Diagnostic Imaging (Radiology) Department.  You will be asked to undress from the waist up and be given a hospital gown to wear, so dress comfortably from the waist down, for example:    Sweat pants, shorts or skirt   Rubber-soled lace up shoes (i.e. tennis shoes)  Plan on about three hours from registration to release from the hospital.    Date of Test:              Time of Test

## 2010-06-16 NOTE — Medication Information (Signed)
Summary: ccr-lr  Anticoagulant Therapy  Managed by: Edrick Oh, RN PCP: Dr. Constance Holster MD: Domenic Polite MD, Mikeal Hawthorne Indication 1: Atrial Fibrillation Lab Used: [facility] [place] Site: [place] INR POC 3.2  Dietary changes: no    Health status changes: no    Bleeding/hemorrhagic complications: no    Recent/future hospitalizations: no    Any changes in medication regimen? no    Recent/future dental: no  Any missed doses?: no       Is patient compliant with meds? yes       Allergies: 1)  Codeine  Anticoagulation Management History:      The patient is taking warfarin and comes in today for a routine follow up visit.  The patient is on warfarin for Atrial Fibrillation.  Positive risk factors for bleeding include an age of 55 years or older.  Negative risk factors for bleeding include no history of CVA/TIA, no history of GI bleeding, and absence of serious comorbidities.  The bleeding index is 'intermediate risk'.  Positive CHADS2 values include Age > 75 years old.  Negative CHADS2 values include History of CHF, History of HTN, History of Diabetes, and Prior Stroke/CVA/TIA.  The start date was 07/27/2009.  Anticoagulation responsible provider: Domenic Polite MD, Mikeal Hawthorne.  INR POC: 3.2.  Cuvette Lot#: [id number].    Anticoagulation Management Assessment/Plan:      The patient's current anticoagulation dose is Warfarin sodium 5 mg tabs: Use as directed by Anticoagulation Clinic.  The target INR is 2.0-3.0.  The next INR is due 11/05/2009.  Anticoagulation instructions were given to patient.  Results were reviewed/authorized by Edrick Oh, RN.  She was notified by Edrick Oh RN.         Prior Anticoagulation Instructions: INR 2.3 Increase coumadin to 7.69m once daily except 580mon Sundays  Current Anticoagulation Instructions: INR 3.2 Pending DCCV .  Message to [facility] that pt is ready to schedule Take coumadin 17m64monight then resume 7.17mg54mce daily except  17mg 217mSundays

## 2010-06-16 NOTE — Letter (Signed)
Summary: Risk analyst at [facility]. [address]   [place], [place] [address]   Phone: [phone]  Fax: [phone]        August 16, 2009 MRN: [id number]   AYAAN RINGLE [address] [place], [place]  [address]   Dear Ms. Lake Bells,  Your test ordered by Rande Lawman has been reviewed by your physician (or physician assistant) and was found to be normal or stable. Your physician (or physician assistant) felt no changes were needed at this time.  ____ Echocardiogram  __X__ Cardiac Stress Test  ____ Lab Work  ____ Peripheral vascular study of arms, legs or neck  ____ CT scan or X-ray  ____ Lung or Breathing test  ____ Other:   Thank you.   Lovina Reach, LPN    Bryon Lions, M.D., F.A.C.C. Maceo Pro, M.D., F.A.C.C. Cammy Copa, M.D., F.A.C.C. Vonda Antigua, M.D., F.A.C.C. Vita Barley, M.D., F.A.C.C. Mare Ferrari, M.D., F.A.C.C. Emi Belfast, PA-C

## 2010-06-16 NOTE — Assessment & Plan Note (Signed)
Summary: NP-PAIN IN JAW;NUMBNESS IN RIGHT   Visit Type:  Initial Consult Primary Provider:  Dr. Eula Fried  CC:  A.fib.  History of Present Illness: the patient is 77 year old female with presumed recent onset history of atrial fibrillation.  The patient has been started on Coumadin by her primary care physician.  The patient reports an episode of severe central chest pain as well as right upper quadrant pain one night several weeks ago.  She denies any central chest pain a short of breath on exertion.  Of note was that the patient intrafibrillation was rate controlled but she was on verapamil because of a history of headaches.  She denies any orthopnea PND.  She does report occasional palpitations and a fullness in her neck area.  She reports that her fasts heartrates occur sometimes 3 hours at a time.  She denies any syncope or presyncope.   Preventive Screening-Counseling & Management  Alcohol-Tobacco     Smoking Status: never  Current Medications (verified): 1)  Zetia 10 Mg Tabs (Ezetimibe) .... Take One Tablet By Mouth Daily. 2)  Verapamil Hcl 80 Mg Tabs (Verapamil Hcl) .... Take 1 Tablet By Mouth Three Times A Day  (Place On File) 3)  Medroxyprogesterone Acetate 2.5 Mg Tabs (Medroxyprogesterone Acetate) .... Take 1 Tablet By Mouth Once A Day 4)  Estropipate 0.75 Mg Tabs (Estropipate) .... Take 1 Tablet By Mouth Once A Day 5)  Aspirin 81 Mg Tbec (Aspirin) .... Take One Tablet By Mouth Daily 6)  Fish Oil Maximum Strength 1200 Mg Caps (Omega-3 Fatty Acids) .... Take 1 Capsule By Mouth Two Times A Day 7)  Oyster-Cal 500 + D 500-125 Mg-Unit Tabs (Calcium-Vitamin D) .... Take 1 Tablet By Mouth Once A Day 8)  Red Yeast Rice Extract 600 Mg Caps (Red Yeast Rice Extract) .... Take 1 Tablet By Mouth Once A Day 9)  Xanax 0.5 Mg Tabs (Alprazolam) .... Take 1 Tab By Mouth At Bedtime 10)  Warfarin Sodium 5 Mg Tabs (Warfarin Sodium) .... Use As Directed By Anticoagulation Clinic  Allergies  (verified): 1)  Codeine  Comments:  Nurse/Medical Assistant: The patient's medications were reviewed with the patient and were updated in the Medication List. Pt brought a list of medications to office visit.  Gurney Maxin, RN, BSN (July 29, 2009 2:32 PM)  Past History:  Family History: Last updated: 07/29/2009 patient has significant family history of stroke in mother and father as well as in two siblings.  Another sibling is a history of heart disease.  Social History: Last updated: 07/29/2009 the patient is retired from [employer].  She is widowed.  She does not smoke.  Risk Factors: Smoking Status: never (07/29/2009)  Past Medical History: thyroid disease bladder tack cataracts both eyes  Family History: patient has significant family history of stroke in mother and father as well as in two siblings.  Another sibling is a history of heart disease.  Social History: the patient is retired from [employer].  She is widowed.  She does not smoke.Smoking Status:  never  Review of Systems       The patient complains of shortness of breath.  The patient denies fatigue, malaise, fever, weight gain/loss, vision loss, decreased hearing, hoarseness, chest pain, palpitations, prolonged cough, wheezing, sleep apnea, coughing up blood, abdominal pain, blood in stool, nausea, vomiting, diarrhea, heartburn, incontinence, blood in urine, muscle weakness, joint pain, leg swelling, rash, skin lesions, headache, fainting, dizziness, depression, anxiety, enlarged lymph nodes, easy bruising or bleeding, and environmental allergies.  neck pain  Vital Signs:  Patient profile:   77 year old female Height:      63 inches Weight:      116 pounds BMI:     20.62 Pulse rate:   80 / minute BP sitting:   128 / 77  (left arm) Cuff size:   regular  Vitals Entered By: Gurney Maxin, RN, BSN (July 29, 2009 2:26 PM) CC: A.fib   Physical Exam  Additional Exam:  General: Well-developed,  well-nourished in no distress head: Normocephalic and atraumatic eyes PERRLA/EOMI intact, conjunctiva and lids normal nose: No deformity or lesions mouth normal dentition, normal posterior pharynx neck: Supple, no JVD.  No masses, thyromegaly or abnormal cervical nodes lungs: Normal breath sounds bilaterally without wheezing.  Normal percussion heart: regular rate and rhythm with normal S1 and S2, no S3 or S4.  PMI is normal.  No pathological murmurs abdomen: Normal bowel sounds, abdomen is soft and nontender without masses, organomegaly or hernias noted.  No hepatosplenomegaly musculoskeletal: Back normal, normal gait muscle strength and tone normal pulsus: Pulse is normal in all 4 extremities Extremities: No peripheral pitting edema neurologic: Alert and oriented x 3 skin: Intact without lesions or rashes cervical nodes: No significant adenopathy psychologic: Normal affect    EKG  Procedure date:  07/29/2009  Findings:      atrial fibrillation.  rightward axis.  Nonspecific ST-T wave changes.  Heart rate 81 beats/min  Impression & Recommendations:  Problem # 1:  ATRIAL FIBRILLATION (ICD-427.31) the patient is apparently new onsetatrial fibrillation.  Her heart rate is well controlled but we will increase her verapamil to 50mlligrams p.o. t.i.d.because she still reports palpitations which are very prolonged at times.  The patient also will be scheduled for cardioversion after she been therapeutic for 4 weeks on her Coumadin. the patient has been evaluated for structural heart disease and had an echocardiogram performed in January of this year.  She has no evidence of structural heart disease.  Her ejection fraction is normal. Her updated medication list for this problem includes:    Aspirin 81 Mg Tbec (Aspirin) ..Marland Kitchen.. Take one tablet by mouth daily    Warfarin Sodium 5 Mg Tabs (Warfarin sodium) ..... Use as directed by anticoagulation clinic  Orders: Nuclear Med (Nuc  Med)  Problem # 2:  COUMADIN THERAPY (ICD-V58.61) the patient will be enrolled in the Coumadin clinic  Problem # 3:  THYROID DISORDER (ICD-246.9) the patient's history of thyroid surgery in 1961.  She reportedly has a nontoxic nodular goiter.  She reported euthyroid.  Problem # 4:  DYSPNEA (ICD-786.05) the patient does have risk factors for coronary disease as well as a strong family history.  Shortness of breath could be related to her atrial fibrillation, she however reports also neck pain.  She will be ruled out for coronary artery disease and a Myoview study has been ordered. Her updated medication list for this problem includes:    Verapamil Hcl 80 Mg Tabs (Verapamil hcl) ..Marland Kitchen.. Take 1 tablet by mouth three times a day  (place on file)    Aspirin 81 Mg Tbec (Aspirin) ..Marland Kitchen.. Take one tablet by mouth daily  Other Orders: EKG w/ Interpretation (93000)  Patient Instructions: 1)  Increase Verapamil to 860mthree times a day  2)  Enroll in our coumadin clinic 3)  Cardioversion in 4 weeks 4)  Exercise Stress Test next week - hold Verapamil a.m. of meds & all other meds okay. 5)  Follow up in  4-6  weeks Prescriptions: VERAPAMIL HCL 80 MG TABS (VERAPAMIL HCL) Take 1 tablet by mouth three times a day  (PLACE ON FILE)  #90 x 6   Entered by:   Lovina Reach, LPN   Authorized by:   Terald Sleeper, MD, [facility]   Signed by:   Lovina Reach, LPN on 26/94/8546   Method used:   Electronically to        [facility] (retail)       [address]       [place], [place]  [address]       Ph: [phone]       Fax: [phone]   RxID:   [id number]

## 2010-06-17 NOTE — Letter (Signed)
Summary: Internal Other/ PATIENT HISTORY FORM  Internal Other/ PATIENT HISTORY FORM   Imported By: Bartholomew Boards 08/03/2009 11:42:10  _____________________________________________________________________  External Attachment:    Type:   Image     Comment:   External Document

## 2010-07-05 ENCOUNTER — Encounter: Payer: Self-pay | Admitting: Cardiology

## 2010-07-05 ENCOUNTER — Encounter (INDEPENDENT_AMBULATORY_CARE_PROVIDER_SITE_OTHER): Payer: Medicare Other

## 2010-07-05 DIAGNOSIS — Z7901 Long term (current) use of anticoagulants: Secondary | ICD-10-CM

## 2010-07-05 DIAGNOSIS — I4891 Unspecified atrial fibrillation: Secondary | ICD-10-CM

## 2010-07-12 NOTE — Medication Information (Signed)
Summary: Coumadin Clinic  Anticoagulant Therapy  Managed by: Edrick Oh, RN PCP: Dr. Constance Holster MD: Domenic Polite MD, Mikeal Hawthorne Indication 1: Atrial Fibrillation Lab Used: [facility] [place] Site: [place] INR POC 3.1  Dietary changes: no    Health status changes: no    Bleeding/hemorrhagic complications: no    Recent/future hospitalizations: no    Any changes in medication regimen? no    Recent/future dental: no  Any missed doses?: no       Is patient compliant with meds? yes       Allergies: 1)  Codeine  Anticoagulation Management History:      The patient is taking warfarin and comes in today for a routine follow up visit.  Anticoagulation is being administered due to Atrial Fibrillation.  Positive risk factors for bleeding include an age of 75 years or older.  Negative risk factors for bleeding include no history of CVA/TIA, no history of GI bleeding, and absence of serious comorbidities.  The bleeding index is 'intermediate risk'.  Positive CHADS2 values include Age > 75 years old.  Negative CHADS2 values include History of CHF, History of HTN, History of Diabetes, and Prior Stroke/CVA/TIA.  The start date was 07/27/2009.  Anticoagulation responsible provider: Domenic Polite MD, Mikeal Hawthorne.  INR POC: 3.1.  Cuvette Lot#: [id number].  Exp: 12/2010.    Anticoagulation Management Assessment/Plan:      The patient's current anticoagulation dose is Warfarin sodium 5 mg tabs: Use as directed by Anticoagulation Clinic.  The target INR is 2.0-3.0.  The next INR is due 08/02/2010.  Anticoagulation instructions were given to patient.  Results were reviewed/authorized by Edrick Oh, RN.  She was notified by Edrick Oh RN.         Prior Anticoagulation Instructions: INR 2.7 Continue coumadin 7.40m once daily   Current Anticoagulation Instructions: INR 3.1 Take coumadin 1 tablet tonight then resume 1 1/2 tablets once daily  Increase greens

## 2010-07-15 ENCOUNTER — Encounter (INDEPENDENT_AMBULATORY_CARE_PROVIDER_SITE_OTHER): Payer: Medicare Other

## 2010-07-15 ENCOUNTER — Encounter: Payer: Self-pay | Admitting: Cardiology

## 2010-07-15 DIAGNOSIS — I4891 Unspecified atrial fibrillation: Secondary | ICD-10-CM

## 2010-07-15 DIAGNOSIS — Z7901 Long term (current) use of anticoagulants: Secondary | ICD-10-CM

## 2010-07-29 ENCOUNTER — Encounter: Payer: Self-pay | Admitting: Cardiology

## 2010-07-29 DIAGNOSIS — I4891 Unspecified atrial fibrillation: Secondary | ICD-10-CM

## 2010-07-29 DIAGNOSIS — Z7901 Long term (current) use of anticoagulants: Secondary | ICD-10-CM

## 2010-08-02 ENCOUNTER — Ambulatory Visit (INDEPENDENT_AMBULATORY_CARE_PROVIDER_SITE_OTHER): Payer: Medicare Other | Admitting: *Deleted

## 2010-08-02 DIAGNOSIS — I4891 Unspecified atrial fibrillation: Secondary | ICD-10-CM

## 2010-08-02 DIAGNOSIS — Z7901 Long term (current) use of anticoagulants: Secondary | ICD-10-CM

## 2010-08-02 MED ORDER — WARFARIN SODIUM 5 MG PO TABS: 5.0000 mg | ORAL_TABLET | ORAL | Status: DC

## 2010-08-02 NOTE — Patient Instructions (Signed)
INR 3.3 Take coumadin 2.12m tonight then resume 7.531mqd.  Increase greens/salads.

## 2010-08-11 NOTE — Medication Information (Signed)
Summary: ccr-lr  Anticoagulant Therapy  Managed by: Edrick Oh, RN PCP: Dr. Constance Holster MD: Dannielle Burn MD, Luvenia Heller Indication 1: Atrial Fibrillation Lab Used: [facility] [place] Site: [place] INR POC 1.8  Dietary changes: no    Health status changes: no    Bleeding/hemorrhagic complications: yes       Details: Had nose bleed on 07/13/10  Recent/future hospitalizations: no    Any changes in medication regimen? no    Recent/future dental: no  Any missed doses?: yes     Details: Held couamdin on 2/29 due to nose bleed  Is patient compliant with meds? yes       Allergies: 1)  Codeine  Anticoagulation Management History:      The patient is taking warfarin and comes in today for a routine follow up visit.  Anticoagulation is being administered due to Atrial Fibrillation.  Positive risk factors for bleeding include an age of 64 years or older.  Negative risk factors for bleeding include no history of CVA/TIA, no history of GI bleeding, and absence of serious comorbidities.  The bleeding index is 'intermediate risk'.  Positive CHADS2 values include Age > 40 years old.  Negative CHADS2 values include History of CHF, History of HTN, History of Diabetes, and Prior Stroke/CVA/TIA.  The start date was 07/27/2009.  Anticoagulation responsible provider: Dannielle Burn MD, Luvenia Heller.  INR POC: 1.8.  Cuvette Lot#: [id number].  Exp: 12/2010.    Anticoagulation Management Assessment/Plan:      The patient's current anticoagulation dose is Warfarin sodium 5 mg tabs: Use as directed by Anticoagulation Clinic.  The target INR is 2.0-3.0.  The next INR is due 08/02/2010.  Anticoagulation instructions were given to patient.  Results were reviewed/authorized by Edrick Oh, RN.  She was notified by Edrick Oh RN.         Prior Anticoagulation Instructions: INR 3.1 Take coumadin 1 tablet tonight then resume 1 1/2 tablets once daily  Increase greens

## 2010-08-30 ENCOUNTER — Ambulatory Visit (INDEPENDENT_AMBULATORY_CARE_PROVIDER_SITE_OTHER): Payer: Medicare Other | Admitting: *Deleted

## 2010-08-30 ENCOUNTER — Encounter: Payer: Medicare Other | Admitting: *Deleted

## 2010-08-30 DIAGNOSIS — I4891 Unspecified atrial fibrillation: Secondary | ICD-10-CM

## 2010-08-30 DIAGNOSIS — Z7901 Long term (current) use of anticoagulants: Secondary | ICD-10-CM

## 2010-08-30 LAB — POCT INR: INR: 2.2

## 2010-09-01 ENCOUNTER — Ambulatory Visit: Payer: Medicare Other | Admitting: Cardiology

## 2010-09-27 ENCOUNTER — Ambulatory Visit (INDEPENDENT_AMBULATORY_CARE_PROVIDER_SITE_OTHER): Payer: Medicare Other | Admitting: *Deleted

## 2010-09-27 DIAGNOSIS — Z7901 Long term (current) use of anticoagulants: Secondary | ICD-10-CM

## 2010-09-27 DIAGNOSIS — I4891 Unspecified atrial fibrillation: Secondary | ICD-10-CM

## 2010-09-27 LAB — POCT INR: INR: 2.6

## 2010-10-03 ENCOUNTER — Other Ambulatory Visit: Payer: Self-pay | Admitting: *Deleted

## 2010-10-03 MED ORDER — FLECAINIDE ACETATE 50 MG PO TABS: 50.0000 mg | ORAL_TABLET | Freq: Two times a day (BID) | ORAL | Status: DC

## 2010-10-12 ENCOUNTER — Encounter: Payer: Self-pay | Admitting: *Deleted

## 2010-10-13 ENCOUNTER — Ambulatory Visit (INDEPENDENT_AMBULATORY_CARE_PROVIDER_SITE_OTHER): Payer: Medicare Other | Admitting: Cardiology

## 2010-10-13 ENCOUNTER — Encounter: Payer: Self-pay | Admitting: Cardiology

## 2010-10-13 VITALS — BP 114/67 | HR 82 | Ht 63.0 in | Wt 118.0 lb

## 2010-10-13 DIAGNOSIS — Z7901 Long term (current) use of anticoagulants: Secondary | ICD-10-CM

## 2010-10-13 DIAGNOSIS — I4891 Unspecified atrial fibrillation: Secondary | ICD-10-CM

## 2010-10-13 MED ORDER — DILTIAZEM HCL ER COATED BEADS 120 MG PO CP24: 120.0000 mg | ORAL_CAPSULE | Freq: Every day | ORAL | Status: DC

## 2010-10-13 NOTE — Patient Instructions (Addendum)
   Stop Flecainide  Stop short acting Diltiazem  Begin Cardizem CD 161m daily Your physician wants you to follow up in: 6 months.  You will receive a reminder letter in the mail one-two months in advance.  If you don't receive a letter, please call our office to schedule the follow up appointment

## 2010-10-13 NOTE — Progress Notes (Signed)
HPI the patient is a 77 year old female with a history of paroxysmal atrial fibrillation. She states she is doing very well. She denies any chest pain shortness of breath orthopnea PND. She has no palpitations or syncope. She remains very active and works in her 2 acre yard. She denies any complications related to her flecainide therapy. She's also compliant on Coumadin therapy and her INR has been therapeutic. Unfortunately appears that the patient cannot maintain normal sinus rhythm. She has had 2 unsuccessful cardioversions. Although Holter monitor June 2011 showed normal sinus rhythm on flecainide. She feels that she is out of rhythm now. EKG in your office confirmed the presence of atypical atrial flutter versus coarse atrial fibrillation. Otherwise the patient is asymptomatic. She remains physically active. She denies any chest pain orthopnea or PND. She was seen in the emergency room and kept overnight 2 weeks ago because of atypical chest pain and a severe headache. Cardiac enzymes however were within normal limits. The patient had a prior Cardiolite study 2011 which showed no ischemia.  Allergies  Allergen Reactions  . Codeine     REACTION: nausea    Current Outpatient Prescriptions on File Prior to Visit  Medication Sig Dispense Refill  . ALPRAZolam (XANAX) 0.5 MG tablet Take 0.5 mg by mouth at bedtime as needed.        Marland Kitchen aspirin 81 MG tablet Take 81 mg by mouth daily.        . Omega-3 Fatty Acids (FISH OIL) 1200 MG CAPS Take 2 capsules by mouth daily.       . Probiotic Product (RESTORA) CAPS Take 1 capsule by mouth daily.        . Red Yeast Rice Extract 600 MG CAPS Take 1 capsule by mouth daily.        Marland Kitchen warfarin (COUMADIN) 5 MG tablet Take 1 tablet (5 mg total) by mouth as directed.  45 tablet  3  . DISCONTD: diltiazem (CARDIZEM) 30 MG tablet Take 30 tablets by mouth Twice daily.      Marland Kitchen DISCONTD: flecainide (TAMBOCOR) 50 MG tablet Take 1 tablet (50 mg total) by mouth 2 (two) times  daily.  60 tablet  3  . DISCONTD: chlordiazePOXIDE (LIBRIUM) 10 MG capsule Take 10 mg by mouth 2 (two) times daily.        Marland Kitchen DISCONTD: cholestyramine (QUESTRAN) 4 G packet Take 4 g by mouth Twice daily.      Marland Kitchen DISCONTD: estropipate (OGEN) 0.75 MG tablet Take 0.75 mg by mouth daily.        Marland Kitchen DISCONTD: medroxyPROGESTERone (PROVERA) 2.5 MG tablet Take 2.5 mg by mouth daily.        Marland Kitchen DISCONTD: meloxicam (MOBIC) 7.5 MG tablet Take 7.5 tablets by mouth Daily.        Past Medical History  Diagnosis Date  . Thyroid disease   . Cataract of both eyes   . Arrhythmia     Cardiolite stress study 2011 normal LV function and no ischemia. Holter monitor June 2011--normal sinus rhythm on flecainide therapy    Past Surgical History  Procedure Date  . Cataract extraction   . Bladder tack     Family History  Problem Relation Age of Onset  . Stroke Mother   . Stroke Father   . Stroke Other     two siblings  . Heart disease Other     sibling    History   Social History  . Marital Status: Widowed    Spouse Name:  N/A    Number of Children: N/A  . Years of Education: N/A   Occupational History  . Not on file.   Social History Main Topics  . Smoking status: Never Smoker   . Smokeless tobacco: Never Used  . Alcohol Use: Not on file  . Drug Use: Not on file  . Sexually Active: Not on file   Other Topics Concern  . Not on file   Social History Narrative   widowRetired from textiles   VXY:IAXKPVVZS positives as outlined above. The remainder of the 18  point review of systems is negative  PHYSICAL EXAM BP 114/67  Pulse 82  Ht 5' 3"  (1.6 m)  Wt 118 lb (53.524 kg)  BMI 20.90 kg/m2  General: Well-developed, well-nourished in no distress Head: Normocephalic and atraumatic Eyes:PERRLA/EOMI intact, conjunctiva and lids normal Ears: No deformity or lesions Mouth:normal dentition, normal posterior pharynx Neck: Supple, no JVD.  No masses, thyromegaly or abnormal cervical nodes Lungs:  Normal breath sounds bilaterally without wheezing.  Normal percussion Cardiac: Irregular  rate and rhythm with normal S1 and S2, no S3 or S4.  PMI is normal.  No pathological murmurs Abdomen: Normal bowel sounds, abdomen is soft and nontender without masses, organomegaly or hernias noted.  No hepatosplenomegaly MSK: Back normal, normal gait muscle strength and tone normal Vascular: Pulse is normal in all 4 extremities Extremities: No peripheral pitting edema Neurologic: Alert and oriented x 3 Skin: Intact without lesions or rashes Lymphatics: No significant adenopathy Psychologic: Normal affect  ECG: Coarse atrial fibrillation/atrial flutter with a heart rate of 85 beats per minute  ASSESSMENT AND PLAN

## 2010-10-13 NOTE — Assessment & Plan Note (Signed)
Continue Coumadin therapy. No complications.

## 2010-10-13 NOTE — Assessment & Plan Note (Signed)
Patient is in permanent atrial fibrillation and we will discontinue flecainide. I did start the patient on Cardizem CD 120 mg daily and stop her short acting diltiazem that she was taking twice a day at 30 mg. The patient will remain on Coumadin therapy.

## 2010-10-25 ENCOUNTER — Ambulatory Visit (INDEPENDENT_AMBULATORY_CARE_PROVIDER_SITE_OTHER): Payer: Medicare Other | Admitting: *Deleted

## 2010-10-25 DIAGNOSIS — Z7901 Long term (current) use of anticoagulants: Secondary | ICD-10-CM

## 2010-10-25 DIAGNOSIS — I4891 Unspecified atrial fibrillation: Secondary | ICD-10-CM

## 2010-11-24 ENCOUNTER — Ambulatory Visit (INDEPENDENT_AMBULATORY_CARE_PROVIDER_SITE_OTHER): Payer: Medicare Other | Admitting: *Deleted

## 2010-11-24 ENCOUNTER — Encounter: Payer: Self-pay | Admitting: *Deleted

## 2010-11-24 DIAGNOSIS — Z7901 Long term (current) use of anticoagulants: Secondary | ICD-10-CM

## 2010-11-24 DIAGNOSIS — I4891 Unspecified atrial fibrillation: Secondary | ICD-10-CM

## 2010-12-05 ENCOUNTER — Telehealth: Payer: Self-pay | Admitting: Cardiology

## 2010-12-05 DIAGNOSIS — I4891 Unspecified atrial fibrillation: Secondary | ICD-10-CM

## 2010-12-05 DIAGNOSIS — Z7901 Long term (current) use of anticoagulants: Secondary | ICD-10-CM

## 2010-12-05 MED ORDER — WARFARIN SODIUM 5 MG PO TABS: 5.0000 mg | ORAL_TABLET | ORAL | Status: DC

## 2010-12-08 NOTE — Progress Notes (Signed)
This encounter was created in error - please disregard.

## 2010-12-27 ENCOUNTER — Ambulatory Visit (INDEPENDENT_AMBULATORY_CARE_PROVIDER_SITE_OTHER): Payer: Medicare Other | Admitting: *Deleted

## 2010-12-27 DIAGNOSIS — Z7901 Long term (current) use of anticoagulants: Secondary | ICD-10-CM

## 2010-12-27 DIAGNOSIS — I4891 Unspecified atrial fibrillation: Secondary | ICD-10-CM

## 2010-12-27 LAB — POCT INR: INR: 4.3

## 2011-01-03 ENCOUNTER — Ambulatory Visit (INDEPENDENT_AMBULATORY_CARE_PROVIDER_SITE_OTHER): Payer: Medicare Other | Admitting: *Deleted

## 2011-01-03 DIAGNOSIS — Z7901 Long term (current) use of anticoagulants: Secondary | ICD-10-CM

## 2011-01-03 DIAGNOSIS — I4891 Unspecified atrial fibrillation: Secondary | ICD-10-CM

## 2011-01-17 ENCOUNTER — Ambulatory Visit (INDEPENDENT_AMBULATORY_CARE_PROVIDER_SITE_OTHER): Payer: Medicare Other | Admitting: *Deleted

## 2011-01-17 DIAGNOSIS — Z7901 Long term (current) use of anticoagulants: Secondary | ICD-10-CM

## 2011-01-17 DIAGNOSIS — I4891 Unspecified atrial fibrillation: Secondary | ICD-10-CM

## 2011-01-17 LAB — POCT INR: INR: 3.2

## 2011-01-25 ENCOUNTER — Encounter (INDEPENDENT_AMBULATORY_CARE_PROVIDER_SITE_OTHER): Payer: Self-pay

## 2011-01-25 ENCOUNTER — Encounter (INDEPENDENT_AMBULATORY_CARE_PROVIDER_SITE_OTHER): Payer: Self-pay | Admitting: *Deleted

## 2011-02-14 ENCOUNTER — Telehealth (INDEPENDENT_AMBULATORY_CARE_PROVIDER_SITE_OTHER): Payer: Self-pay | Admitting: *Deleted

## 2011-02-14 ENCOUNTER — Other Ambulatory Visit (INDEPENDENT_AMBULATORY_CARE_PROVIDER_SITE_OTHER): Payer: Self-pay | Admitting: *Deleted

## 2011-02-14 ENCOUNTER — Ambulatory Visit (INDEPENDENT_AMBULATORY_CARE_PROVIDER_SITE_OTHER): Payer: Medicare Other | Admitting: Internal Medicine

## 2011-02-14 ENCOUNTER — Ambulatory Visit (INDEPENDENT_AMBULATORY_CARE_PROVIDER_SITE_OTHER): Payer: Medicare Other | Admitting: *Deleted

## 2011-02-14 ENCOUNTER — Encounter (INDEPENDENT_AMBULATORY_CARE_PROVIDER_SITE_OTHER): Payer: Self-pay | Admitting: *Deleted

## 2011-02-14 ENCOUNTER — Encounter (INDEPENDENT_AMBULATORY_CARE_PROVIDER_SITE_OTHER): Payer: Self-pay | Admitting: Internal Medicine

## 2011-02-14 VITALS — BP 130/80 | HR 72 | Temp 97.5°F | Ht 63.0 in | Wt 120.1 lb

## 2011-02-14 DIAGNOSIS — R195 Other fecal abnormalities: Secondary | ICD-10-CM

## 2011-02-14 DIAGNOSIS — Z7901 Long term (current) use of anticoagulants: Secondary | ICD-10-CM

## 2011-02-14 DIAGNOSIS — I4891 Unspecified atrial fibrillation: Secondary | ICD-10-CM

## 2011-02-14 DIAGNOSIS — R198 Other specified symptoms and signs involving the digestive system and abdomen: Secondary | ICD-10-CM

## 2011-02-14 LAB — POCT INR: INR: 3.5

## 2011-02-14 MED ORDER — PEG-KCL-NACL-NASULF-NA ASC-C 100 G PO SOLR: 1.0000 | Freq: Once | ORAL | Status: DC

## 2011-02-14 NOTE — Progress Notes (Signed)
Subjective:     Patient ID: Natalie Carlson, female   DOB: 02/09/1930, 77 y.o.   MRN: [id number]  HPI  Natalie Carlson is a 77 yr old female referred to our office by Dr. Eula Fried for pain in her rt lower quadrant. She says she has a rectal discharge and she tell me it smells terrible.  She had the rectal discharge for at least a year.The discharge can be clear and sometimes it looks like it has blood in it.  She has a BM most every am. Her stools are brown.  No blood on stools. Stool studies are pending.  She feels like everybody can smell her.  She was on Cipro over a month ago.  Stools studies in September were normal.  Sometimes her stools are small like a pencil for a few months. 8.13/2012 CT abdomen and pelvis with CM for low abdominal pain with rectal discharge.  No acute findings in the abdomen or pelvis. Specifically, no evidence to explain the patient's history of abdominal pain and rectal discharge.  No family history of colon cancer.  She had a colonoscopy 3 yrs ago by Dr. Anthony Sar and it was normal. Hemorrhoids. Review of Systems  See hpi Current Outpatient Prescriptions  Medication Sig Dispense Refill  . acetaminophen (TYLENOL) 650 MG CR tablet Take 650 mg by mouth every 8 (eight) hours as needed.        . ALPRAZolam (XANAX) 0.5 MG tablet Take 0.5 mg by mouth at bedtime as needed.        Marland Kitchen aspirin 81 MG tablet Take 81 mg by mouth daily.        Marland Kitchen diltiazem (CARDIZEM CD) 120 MG 24 hr capsule Take 1 capsule (120 mg total) by mouth daily.  30 capsule  6  . Multiple Vitamins-Minerals (HAIR/SKIN/NAILS PO) Take by mouth.        . Omega-3 Fatty Acids (FISH OIL) 1200 MG CAPS Take 2 capsules by mouth daily.       . Probiotic Product (FLORAJEN3 PO) Take by mouth.        . Probiotic Product (RESTORA) CAPS Take 1 capsule by mouth daily.        . psyllium (METAMUCIL) 0.52 G capsule Take 0.52 g by mouth daily.        Marland Kitchen warfarin (COUMADIN) 5 MG tablet Take 1 tablet (5 mg total) by mouth as directed.  45 tablet  3     Family Status  Relation Status Death Age  . Mother Deceased     CVA  . Father Deceased     CVA  . Sister      One alive with chronic pain, Other 3 died from CVAs  . Child      Two in good health. One deceased from non-Hodgkin's lymphoma   History   Social History Narrative   widowRetired from [employer]   History   Social History  . Marital Status: Widowed    Spouse Name: N/A    Number of Children: N/A  . Years of Education: N/A   Occupational History  . Not on file.   Social History Main Topics  . Smoking status: Never Smoker   . Smokeless tobacco: Never Used  . Alcohol Use: No  . Drug Use: No  . Sexually Active: Not on file   Other Topics Concern  . Not on file   Social History Narrative   widowRetired from textiles   Allergies  Allergen Reactions  . Codeine  REACTION: nausea       Objective:   Physical Exam  Filed Vitals:   02/14/11 1542  Height: 5' 3"  (1.6 m)  Weight: 120 lb 1.6 oz (54.477 kg)    Alert and oriented. Skin warm and dry. Oral mucosa is moist. Sclera anicteric, conjunctivae is pink. Thyroid not enlarged. No cervical lymphadenopathy. Lungs clear. Heart regular rate and rhythm.  Abdomen is soft. Bowel sounds are positive.Guaiac: no stool and guaiac negative. No hepatomegaly. No abdominal masses felt. No tenderness.  No edema to lower extremities. Patient is alert and oriented.     Assessment:     Rectal discharge.  Rt lower abdominal pain.  Colonic neoplasm needs to be ruled.    Plan:     Colonoscopy.  The risks and benefits such as perforation, bleeding, and infection were reviewed with the patient and is agreeable. All questions were answered.  She is satisfied with the care she received today.

## 2011-02-20 NOTE — Telephone Encounter (Signed)
Patient need movi prep

## 2011-03-02 ENCOUNTER — Encounter (INDEPENDENT_AMBULATORY_CARE_PROVIDER_SITE_OTHER): Payer: Self-pay | Admitting: *Deleted

## 2011-03-03 MED ORDER — SODIUM CHLORIDE 0.45 % IV SOLN
Freq: Once | INTRAVENOUS | Status: AC
  Administered 2011-03-06: 07:00:00 via INTRAVENOUS

## 2011-03-06 ENCOUNTER — Encounter (HOSPITAL_COMMUNITY): Payer: Self-pay

## 2011-03-06 ENCOUNTER — Ambulatory Visit (HOSPITAL_COMMUNITY)
Admission: RE | Admit: 2011-03-06 | Discharge: 2011-03-06 | Disposition: A | Discharge status: Home / Self Care | Payer: Medicare Other | Source: Ambulatory Visit | Attending: Internal Medicine | Admitting: Internal Medicine

## 2011-03-06 ENCOUNTER — Encounter (HOSPITAL_COMMUNITY)
Admission: RE | Disposition: A | Discharge status: Home / Self Care | Payer: Self-pay | Source: Ambulatory Visit | Attending: Internal Medicine

## 2011-03-06 ENCOUNTER — Other Ambulatory Visit (INDEPENDENT_AMBULATORY_CARE_PROVIDER_SITE_OTHER): Payer: Self-pay | Admitting: Internal Medicine

## 2011-03-06 DIAGNOSIS — D129 Benign neoplasm of anus and anal canal: Secondary | ICD-10-CM | POA: Insufficient documentation

## 2011-03-06 DIAGNOSIS — R198 Other specified symptoms and signs involving the digestive system and abdomen: Secondary | ICD-10-CM | POA: Insufficient documentation

## 2011-03-06 DIAGNOSIS — K644 Residual hemorrhoidal skin tags: Secondary | ICD-10-CM | POA: Insufficient documentation

## 2011-03-06 DIAGNOSIS — K6289 Other specified diseases of anus and rectum: Secondary | ICD-10-CM

## 2011-03-06 DIAGNOSIS — K626 Ulcer of anus and rectum: Secondary | ICD-10-CM

## 2011-03-06 DIAGNOSIS — K633 Ulcer of intestine: Secondary | ICD-10-CM

## 2011-03-06 DIAGNOSIS — R1031 Right lower quadrant pain: Secondary | ICD-10-CM | POA: Insufficient documentation

## 2011-03-06 DIAGNOSIS — R195 Other fecal abnormalities: Secondary | ICD-10-CM

## 2011-03-06 DIAGNOSIS — D128 Benign neoplasm of rectum: Secondary | ICD-10-CM | POA: Insufficient documentation

## 2011-03-06 DIAGNOSIS — K5289 Other specified noninfective gastroenteritis and colitis: Secondary | ICD-10-CM | POA: Insufficient documentation

## 2011-03-06 DIAGNOSIS — Z7901 Long term (current) use of anticoagulants: Secondary | ICD-10-CM | POA: Insufficient documentation

## 2011-03-06 HISTORY — PX: COLONOSCOPY: SHX5424

## 2011-03-06 SURGERY — COLONOSCOPY
Anesthesia: Moderate Sedation

## 2011-03-06 MED ORDER — MEPERIDINE HCL 50 MG/ML IJ SOLN
INTRAMUSCULAR | Status: AC
  Filled 2011-03-06: qty 1

## 2011-03-06 MED ORDER — MEPERIDINE HCL 50 MG/ML IJ SOLN
INTRAMUSCULAR | Status: DC | PRN
  Administered 2011-03-06: 15 mg via INTRAVENOUS
  Administered 2011-03-06: 10 mg via INTRAVENOUS

## 2011-03-06 MED ORDER — MIDAZOLAM HCL 5 MG/5ML IJ SOLN
INTRAMUSCULAR | Status: AC
  Filled 2011-03-06: qty 10

## 2011-03-06 MED ORDER — MESALAMINE 1000 MG RE SUPP: 1000.0000 mg | Freq: Every day | RECTAL | Status: DC

## 2011-03-06 MED ORDER — MIDAZOLAM HCL 5 MG/5ML IJ SOLN
INTRAMUSCULAR | Status: DC | PRN
  Administered 2011-03-06: 2 mg via INTRAVENOUS
  Administered 2011-03-06 (×2): 1 mg via INTRAVENOUS

## 2011-03-06 NOTE — H&P (Signed)
This is an update to history and physical from 02/14/2011. Patient remains with a daily right lower quadrant pain describes it  to be intense at times. She remains with rectal discharge which is times bloody. She does not have diarrhea and/or constipation. She remains on her usual medications. She is undergoing diagnostic colonoscopy.

## 2011-03-06 NOTE — Op Note (Signed)
COLONOSCOPY PROCEDURE REPORT  PATIENT:  Natalie Carlson  MR#:  [id number] Birthdate:  10-Mar-1930, 77 y.o., female Endoscopist:  Dr. Rogene Houston, MD Referred By:  Dr.  [name], MD. Procedure Date: 03/06/2011  Procedure:   Colonoscopy  Indications:  Patient is 77 year old Caucasian female who presents with chronic rectal discharge which is at times blood-tinged and she also complains of right lower quadrant pain at times described to be quite intense. Patient is undergoing diagnostic colonoscopy. Her Coumadin is on hold.  Informed Consent: Procedure and risks were reviewed with the patient and informed consent was obtained. Medications:  Demerol 25 mg IV Versed  4 mg IV  Description of procedure:  After a digital rectal exam was performed, that colonoscope was advanced from the anus through the rectum and colon to the area of the cecum, ileocecal valve and appendiceal orifice. The cecum was deeply intubated. These structures were well-seen and photographed for the record. From the level of the cecum and ileocecal valve, the scope was slowly and cautiously withdrawn. The mucosal surfaces were carefully surveyed utilizing scope tip to flexion to facilitate fold flattening as needed. The scope was pulled down into the rectum where a thorough exam including retroflexion was performed. Terminal ileum was also examined.  Findings:   Normal terminal ileum. Mucosal erythema and  friability with erosions involving both lips of ileocecal valve. 2 erosions at transverse and one at sigmoid colon. Focal ulceration just proximal to dentate line. Small hemorrhoids below the dentate line.  Therapeutic/Diagnostic Maneuvers Performed:  Biopsy taken from ileocecal valve and rectal ulcer.  Complications:  None  Cecal Withdrawal Time:  15 minutes  Impression:  Normal terminal ileum. Ulceration involving ileocecal valve and rectum with 2 erosions at transverse and 1 at sigmoid colon. Op she taken  from ileocecal valve and rectal ulcer. Ileocecal ulcer may explain her right-sided pain and rectal ulcer explain her rectal discharge and bleeding.  Recommendations:  Patient will resume her Coumadin later today at usual dose and get her INR checked in 10-14 days. Canasa suppositories 1 g per rectum at bedtime daily for 4 weeks. Other recommendations to follow based on biopsy results.  Shaden Lacher U  03/06/2011 8:22 AM  CC: Dr. [name], MD & Dr. Rayne Du ref. provider found

## 2011-03-10 ENCOUNTER — Encounter (INDEPENDENT_AMBULATORY_CARE_PROVIDER_SITE_OTHER): Payer: Self-pay | Admitting: *Deleted

## 2011-03-10 ENCOUNTER — Encounter (HOSPITAL_COMMUNITY): Payer: Self-pay | Admitting: Internal Medicine

## 2011-03-14 ENCOUNTER — Ambulatory Visit (INDEPENDENT_AMBULATORY_CARE_PROVIDER_SITE_OTHER): Payer: Medicare Other | Admitting: *Deleted

## 2011-03-14 DIAGNOSIS — Z7901 Long term (current) use of anticoagulants: Secondary | ICD-10-CM

## 2011-03-14 DIAGNOSIS — I4891 Unspecified atrial fibrillation: Secondary | ICD-10-CM

## 2011-03-27 ENCOUNTER — Ambulatory Visit (INDEPENDENT_AMBULATORY_CARE_PROVIDER_SITE_OTHER): Payer: Medicare Other | Admitting: Internal Medicine

## 2011-03-28 ENCOUNTER — Ambulatory Visit (INDEPENDENT_AMBULATORY_CARE_PROVIDER_SITE_OTHER): Payer: Medicare Other | Admitting: Internal Medicine

## 2011-03-28 ENCOUNTER — Encounter (INDEPENDENT_AMBULATORY_CARE_PROVIDER_SITE_OTHER): Payer: Self-pay | Admitting: Internal Medicine

## 2011-03-28 VITALS — BP 118/70 | HR 72 | Temp 97.6°F | Ht 63.0 in | Wt 119.0 lb

## 2011-03-28 DIAGNOSIS — K529 Noninfective gastroenteritis and colitis, unspecified: Secondary | ICD-10-CM

## 2011-03-28 DIAGNOSIS — K5289 Other specified noninfective gastroenteritis and colitis: Secondary | ICD-10-CM

## 2011-03-28 DIAGNOSIS — K5010 Crohn's disease of large intestine without complications: Secondary | ICD-10-CM | POA: Insufficient documentation

## 2011-03-28 NOTE — Progress Notes (Signed)
Subjective:     Patient ID: Natalie Carlson, female   DOB: 06-Jun-1929, 77 y.o.   MRN: [id number]  HPI Natalie Carlson is a 77 yr old female here today for f/u. She underwent a colonoscopy in October of this year She had been seen in our office from passing mucous from her rectum. Foul smell mucous and rt lower quadrant pain. :Findings:  Normal terminal ileum.  Mucosal erythema and friability with erosions involving both lips of ileocecal valve.  2 erosions at transverse and one at sigmoid colon.  Focal ulceration just proximal to dentate line.  Small hemorrhoids below the dentate line.  Biopsy of colon, ileocecal valve ulceration  revealed chronic active colitis with surface erosion. No evidence of dysplasia or malignancy. Rectum: Hyperplastic polyp with surface erosion. No dysplasia, adenomatous changes or malignancy.  She was given an Careers information officer for Canasa supp x March 07, 2011.    She tells me today she still has mucous from her rectum. It is still foul smelling.  She still has rt lower quadrant pain. Appetite is good. No weight loss. BM x 1 a day.  Stools are formed.he does tell me she get constipated. No melena or bright red rectal bleeding.  Review of Systems see hpi Current Outpatient Prescriptions  Medication Sig Dispense Refill  . acetaminophen (TYLENOL) 650 MG CR tablet Take 650 mg by mouth every 8 (eight) hours as needed. For pain      . ALPRAZolam (XANAX) 0.5 MG tablet Take 0.5 mg by mouth at bedtime as needed. For sleep      . aspirin EC 81 MG tablet Take 81 mg by mouth daily.        . diclofenac sodium (VOLTAREN) 1 % GEL Apply 1 application topically 2 (two) times daily as needed. For tight muscles in shoulder       . diltiazem (CARDIZEM CD) 120 MG 24 hr capsule Take 1 capsule (120 mg total) by mouth daily.  30 capsule  6  . mesalamine (CANASA) 1000 MG suppository Place 1 suppository (1,000 mg total) rectally at bedtime.  30 suppository  2  . Multiple Vitamins-Minerals (HAIR/SKIN/NAILS PO) Take 1  tablet by mouth daily.       . peg 3350 powder (MOVIPREP) 100 G SOLR Take 1 kit by mouth once.        . psyllium (METAMUCIL) 0.52 G capsule Take 0.52 g by mouth daily.        Marland Kitchen warfarin (COUMADIN) 5 MG tablet Take 7.5 mg by mouth daily.       . Probiotic Product (FLORAJEN3 PO) Take by mouth.         Past Surgical History  Procedure Date  . Cataract extraction   . Bladder tack   . Colonoscopy 3 YRS AGO  . Thyroid surgery   . Tonsillectomy   . Colonoscopy 03/06/2011    Procedure: COLONOSCOPY;  Surgeon: Rogene Houston, MD;  Location: [facility];  Service: Endoscopy;  Laterality: N/A;  7:30 am   Past Medical History  Diagnosis Date  . Thyroid disease   . Cataract of both eyes   . Arrhythmia     Cardiolite stress study 2011 normal LV function and no ischemia. Holter monitor June 2011--normal sinus rhythm on flecainide therapy  . RLQ abdominal pain   . Blood in stool   . Mucus in stool   . Atrial fibrillation   . Chronic diarrhea    History   Social History Narrative   widowRetired from  textiles   History   Social History  . Marital Status: Widowed    Spouse Name: N/A    Number of Children: N/A  . Years of Education: N/A   Occupational History  . Not on file.   Social History Main Topics  . Smoking status: Never Smoker   . Smokeless tobacco: Never Used  . Alcohol Use: No  . Drug Use: No  . Sexually Active: Not on file   Other Topics Concern  . Not on file   Social History Narrative   widowRetired from textiles   Family Status  Relation Status Death Age  . Mother Deceased     CVA  . Father Deceased     CVA  . Sister      One alive with chronic pain, Other 3 died from CVAs  . Child      Two in good health. One deceased from non-Hodgkin's lymphoma   Allergies  Allergen Reactions  . Chocolate Other (See Comments)    Causes migraines  . Codeine     REACTION: nausea  . Penicillins Other (See Comments)    Childhood allergy       Objective:    Physical Exam  Filed Vitals:   03/28/11 1507  BP: 118/70  Pulse: 72  Temp: 97.6 F (36.4 C)  Height: 5' 3"  (1.6 m)  Weight: 119 lb (53.978 kg)    Alert and oriented. Skin warm and dry. Oral mucosa is moist. Natural teeth in good condition. Sclera anicteric, conjunctivae is pink. Thyroid not enlarged. No cervical lymphadenopathy. Lungs clear. Heart regular rate and rhythm.  Abdomen is soft. Bowel sounds are positive. No hepatomegaly. No abdominal masses felt. No tenderness.  No edema to lower extremities. Patient is alert and oriented.      Assessment:    Ileocecal valve ulceration. Active colitis with surface erosion. Possible inflammatory bowel disease. I discussed this case with Dr. Laural Golden.     Plan:     Will start on Entocort 17m x 2 weeks, Entocort 654mx 2 weeks, Entocort 54m354m 2 weeks. OV in 6-8 weeks with Dr. RehLaural Golden

## 2011-03-28 NOTE — Patient Instructions (Signed)
Entocort 39m x 2 week, 672mx 2 week, 3 mg x 3week. OV in 6 weeks. [facility]

## 2011-04-03 ENCOUNTER — Telehealth (INDEPENDENT_AMBULATORY_CARE_PROVIDER_SITE_OTHER): Payer: Self-pay | Admitting: Internal Medicine

## 2011-04-03 NOTE — Telephone Encounter (Signed)
LM stating she would like for Terri or Dr. Laural Golden to give her a return call. The medicine, Budesonide given to her didn't work. She had a bad reaction to the medication. Her pharmacist told her to stop the medicine and take benadryl. Please return the call to [phone] or [phone].

## 2011-04-04 NOTE — Telephone Encounter (Signed)
She is going to try the Canasa supp again. No ASA. She will call tomorrow and let me know how she is doing.

## 2011-04-07 ENCOUNTER — Other Ambulatory Visit: Payer: Self-pay | Admitting: *Deleted

## 2011-04-07 MED ORDER — WARFARIN SODIUM 5 MG PO TABS: 7.5000 mg | ORAL_TABLET | Freq: Every day | ORAL | Status: DC

## 2011-04-11 ENCOUNTER — Ambulatory Visit (INDEPENDENT_AMBULATORY_CARE_PROVIDER_SITE_OTHER): Payer: Medicare Other | Admitting: *Deleted

## 2011-04-11 ENCOUNTER — Other Ambulatory Visit: Payer: Self-pay | Admitting: Cardiology

## 2011-04-11 DIAGNOSIS — I4891 Unspecified atrial fibrillation: Secondary | ICD-10-CM

## 2011-04-11 DIAGNOSIS — Z7901 Long term (current) use of anticoagulants: Secondary | ICD-10-CM

## 2011-04-11 LAB — POCT INR: INR: 2.9

## 2011-04-24 ENCOUNTER — Other Ambulatory Visit (HOSPITAL_COMMUNITY): Payer: Self-pay | Admitting: Optometry

## 2011-04-24 DIAGNOSIS — R918 Other nonspecific abnormal finding of lung field: Secondary | ICD-10-CM

## 2011-04-27 LAB — PULMONARY FUNCTION TEST

## 2011-05-02 ENCOUNTER — Ambulatory Visit (INDEPENDENT_AMBULATORY_CARE_PROVIDER_SITE_OTHER): Payer: Medicare Other | Admitting: Internal Medicine

## 2011-05-03 ENCOUNTER — Other Ambulatory Visit (HOSPITAL_COMMUNITY): Payer: Medicare Other

## 2011-05-04 ENCOUNTER — Encounter (HOSPITAL_COMMUNITY)
Admission: RE | Admit: 2011-05-04 | Discharge: 2011-05-04 | Disposition: A | Discharge status: Home / Self Care | Payer: Medicare Other | Source: Ambulatory Visit | Attending: Optometry | Admitting: Optometry

## 2011-05-04 DIAGNOSIS — R918 Other nonspecific abnormal finding of lung field: Secondary | ICD-10-CM

## 2011-05-04 DIAGNOSIS — D381 Neoplasm of uncertain behavior of trachea, bronchus and lung: Secondary | ICD-10-CM | POA: Insufficient documentation

## 2011-05-04 DIAGNOSIS — R222 Localized swelling, mass and lump, trunk: Secondary | ICD-10-CM | POA: Insufficient documentation

## 2011-05-04 DIAGNOSIS — J984 Other disorders of lung: Secondary | ICD-10-CM | POA: Insufficient documentation

## 2011-05-04 LAB — GLUCOSE, CAPILLARY: Glucose-Capillary: 109 mg/dL — ABNORMAL HIGH (ref 70–99)

## 2011-05-04 MED ORDER — FLUDEOXYGLUCOSE F - 18 (FDG) INJECTION
18.3000 | Freq: Once | INTRAVENOUS | Status: AC | PRN
  Administered 2011-05-04: 18.3 via INTRAVENOUS

## 2011-05-05 ENCOUNTER — Other Ambulatory Visit: Payer: Self-pay | Admitting: *Deleted

## 2011-05-05 DIAGNOSIS — I4891 Unspecified atrial fibrillation: Secondary | ICD-10-CM

## 2011-05-05 MED ORDER — DILTIAZEM HCL ER COATED BEADS 120 MG PO CP24: 120.0000 mg | ORAL_CAPSULE | Freq: Every day | ORAL | Status: DC

## 2011-05-11 ENCOUNTER — Other Ambulatory Visit: Payer: Medicare Other | Admitting: *Deleted

## 2011-05-12 ENCOUNTER — Ambulatory Visit (INDEPENDENT_AMBULATORY_CARE_PROVIDER_SITE_OTHER): Payer: Medicare Other | Admitting: *Deleted

## 2011-05-12 DIAGNOSIS — I4891 Unspecified atrial fibrillation: Secondary | ICD-10-CM

## 2011-05-12 DIAGNOSIS — Z7901 Long term (current) use of anticoagulants: Secondary | ICD-10-CM

## 2011-05-12 LAB — POCT INR: INR: 2.5

## 2011-05-18 ENCOUNTER — Ambulatory Visit: Payer: Medicare Other | Admitting: Radiation Oncology

## 2011-05-18 ENCOUNTER — Encounter: Payer: Self-pay | Admitting: Cardiothoracic Surgery

## 2011-05-18 ENCOUNTER — Ambulatory Visit: Payer: Medicare Other | Admitting: Internal Medicine

## 2011-05-18 ENCOUNTER — Institutional Professional Consult (permissible substitution) (INDEPENDENT_AMBULATORY_CARE_PROVIDER_SITE_OTHER): Payer: Medicare Other | Admitting: Cardiothoracic Surgery

## 2011-05-18 DIAGNOSIS — R222 Localized swelling, mass and lump, trunk: Secondary | ICD-10-CM

## 2011-05-18 DIAGNOSIS — R918 Other nonspecific abnormal finding of lung field: Secondary | ICD-10-CM

## 2011-05-18 DIAGNOSIS — I4891 Unspecified atrial fibrillation: Secondary | ICD-10-CM

## 2011-05-18 NOTE — Patient Instructions (Addendum)
Stop coumadin Jan 11 Get MRI of head Thyroid ultrasound  Pulmonary Nodule A pulmonary (lung) nodule is small, round growth in the lung. The size of a pulmonary nodule can be as small as a pencil eraser (1/5 inch or 4 millmeters) to a little bigger than your biggest toenail (1 inch or 25 millimeters). A pulmonary nodule is usually an unplanned finding. It may be found on a chest X-ray or a computed tomography (CT) scan when you have imaging tests of your lungs done. When a pulmonary nodule is found, tests will be done to determine if the nodule is benign (not cancerous) or malignant (cancerous). Follow-up treatment or testing is based on the size of the pulmonary nodule and your risk of getting lung cancer.   CAUSES Causes of pulmonary nodules can vary.   Benign pulmonary nodules  can be caused from different things. Some of these things include:  Infection. This can be a common cause of a benign pulmonary nodule. The infection may be active (a current infection) or an old infection that is no longer active. Three types of infections can cause a pulmonary nodule. These are:     Bacterial Infection.     Fungal infection.     Viral Infections.     Hematoma. This is a bruise in the lung. A hematoma can happen from an injury to your chest.     Some common diseases can lead to benign pulmonary nodules. For example, rheumatoid arthritis can be a cause of a pulmonary nodule.     Other unusual things can cause a benign pulmonary nodule. These can include:     Having had tuberculosis.     Rare diseases, such as a lung cyst.  Malignant pulmonary nodules.  These are cancerous growths. The cancer may have:  Started in the lung. Some lung cancers first detected as a pulmonary nodule.     Spread to the lung from cancer somewhere else in the body. This is called metastatic cancer.     Certain risk factors make a cancerous pulmonary nodule more likely. They include:     Age. As people get older, a  pulmonary nodule is more likely to be cancerous.     Cancer history. If one of your immediate family members has had cancer, you have a higher risk of developing cancer.     Smoking. This includes people who currently smoke and those who have quit.  DIAGNOSIS To diagnose whether a pulmonary nodule is benign or malignant, a variety of tests will be done. This includes things such as:  Health history. Questions regarding your current health, past health, and family health will be asked.     Blood tests. Results of blood work can show:     Tumor markers for cancer.     Any type of infection.     A skin test called a tuberculin (TB) test may be done. This test can tell if you have been exposed to the germ that causes tuberculosis.     Imaging tests. These take pictures of your lungs. Types of imaging tests include:     Chest X-ray. This can help in several ways. An X-ray gives a close-up look at the pulmonary nodule. A new X-ray can be compared with any X-rays you have had in the past .       Computed tomography  (CT) scan. This test shows smaller pulmonary nodules more clearly than an X-ray.     Positron emission tomography  (  PET) scan. This is a test that uses a radioactive substance to identify a pulmonary nodule. A safe amount of radioactive substance is injected into the blood stream. Then, the scan takes a picture of the pulmonary nodule. A malignant pulmonary nodule will absorb the substance faster than a benign pulmonary nodule. The radioactive substance is eliminated from your body in your urine.     Biopsy.  This removes a tiny piece of the pulmonary nodule so it can be checked under a microscope. Medicine  will be given to help keep you relaxed and pain free when a biopsy is done. Types of biopsies include:     Bronchoscopy . This is a surgical procedure. It can be used for pulmonary nodules that are close to the airways in the lung. It uses a scope (a thin tube) with a tiny camera  and light on the end. The scope is put in the windpipe. Your caregiver can then see inside the lung. A tiny tool put through the scope is used to take a small sample of the pulmonary nodule tissue.     Transthoracic needle aspiration . This method is used if the pulmonary nodule is far away from the air passages in the lung. A long, thin needle is put through the chest into the lung nodule. A CT scan is done at the same time which can make it easier to locate the pulmonary nodule.     Surgical lung biopsy . This is a surgical procedure in which the pulmonary nodule is removed. This is usually recommended when the pulmonary nodule is most likely malignant or a biopsy cannot be obtained by either bronchoscopy or transthoracic needle aspiration.  PULMONARY NODULE FOLLOW-UP RECOMMENDATIONS The frequency of pulmonary nodule follow-up is based on your risk factors and size of the pulmonary nodule.  If your caregiver suspects the pulmonary nodule is cancerous or the pulmonary nodule changes during any of the follow-up CT scans, additional testing or biopsies will be done.    If you have no or low risk of getting lung cancer (non-smoker, no personal cancer history), recommended follow-up is based on the following pulmonary nodule size:     A pulmonary nodule that is < 4 mm does not require any follow-up.     A pulmonary nodule that is 4 to 6 mm should be re-imaged by CT scan in 12 months.     A pulmonary nodule that is 6 to 8 mm should be re-imaged by CT scan at 6 to 12 months and then again at 18 to 24 months if no change in size.     A pulmonary nodule > 8 mm in size should be followed closely and re-imaged by CT scan at 3, 9, and 24 months.       If you are at risk of getting lung cancer (current or former smoker, family history of cancer), recommended follow-up is based on the following pulmonary nodule size:     A pulmonary nodule that is < 4 mm in size should be re-imaged by CT scan in 12 months.      A pulmonary nodule that is 4 to 6 mm in size should be re-imaged by CT scan at 6 to 12 months and again at 18 to 24 months.     A pulmonary nodule that is 6 to 8 mm in size should be re-imaged by CT scan at 3, 9, and 24 months.     A pulmonary nodule > 8 mm  in size should be followed closely and re-imaged by CT scan at 3, 9, and 24 months.  SEEK MEDICAL CARE IF: While waiting for test results to determine what type of pulmonary nodule you have, be sure to contact your caregiver if you:  Have trouble breathing when you are active.     Feel sick or unusually tired.     Do not feel like eating.     Lose weight without trying to.     Develop chills or night sweats.     Mild or moderate fevers generally have no long-term effects and often do not require treatment. There are a few exceptions (see below).  SEEK IMMEDIATE MEDICAL CARE IF:  You cannot catch your breath or you begin wheezing.     You cannot stop coughing.     You cough up blood.     You feel like you are going to pass out or become dizzy.     You have sudden chest pain.     You have a fever or persistent symptoms for more than 72 hours.     You have a fever and your symptoms suddenly get worse.  MAKE SURE YOU    Understand these instructions.     Will watch your condition.     Will get help right away if you are not doing well or get worse.  Document Released: 02/26/2009 Document Revised: 01/11/2011 Document Reviewed: 02/26/2009 [facility] Patient Information 2012 [place].  Thoracoscopy Thoracoscopy is a procedure in which a thin, lighted tube (thoracoscope) is put through a small cut (incision) in the chest wall. This procedure makes it possible for your caregiver to look at the lungs or other structures in the chest cavity and to do some minor operations. This is a more minor procedure than thoracotomy, which opens the chest cavity with a large incision. Thoracoscopy can sometimes be used instead of  thoracotomy. Thoracoscopy usually involves less pain, a shorter hospital stay, and a shorter recovery time. Common reasons for this procedure are:  To study diseases or problems in the chest.     To take a tissue sample (biopsy) to study under a microscope.     To put medicines directly into the lungs.     To remove collections of fluid, pus (empyema), or blood in the chest.  LET YOUR CAREGIVER KNOW ABOUT:    Allergies to food or medicine.     Medicines taken, including vitamins, herbs, eyedrops, over-the-counter medicines, and creams.     Use of steroids (by mouth or creams).     Previous problems with anesthetics or numbing medicines.     History of bleeding problems or blood clots.     Previous surgery.     Any history of heart problems.     Other health problems, including diabetes and kidney problems.     Possibility of pregnancy, if this applies.  RISKS AND COMPLICATIONS    If too much bleeding occurs, or if the surgery turns out to be major, it may be necessary to open the chest (thoracotomy) to control the bleeding.     There is a risk of injury to nerves or other structures in the chest as a result of the placement of the instruments.     When the chest tube is removed, the lung may collapse (pneumothorax). If this happens, the tube may need to be reinserted and left in place until a time when the lung will remain expanded as the tube  is removed.  BEFORE THE PROCEDURE    You may have routine tests done, such as blood tests, urine tests, and chest X-rays.     Electrocardiography (EKG) to record the electrical activity of the heart may be done to make sure the heart is okay.     Do not eat or drink after midnight the night before the procedure. Medicine given before the procedure that makes you sleep (general anesthetic) may cause vomiting. A patient who vomits is in danger of inhaling food into the lungs. This can cause serious complications and can be  life-threatening.  PROCEDURE   Video-assisted thoracic surgery (VATS) is surgery using a thoracoscope with a small video camera on the end. The picture from inside the chest is displayed on a television screen for the surgeon to see. The lung being worked on is collapsed and several small incisions are made to insert instruments. The surgeon can manipulate the instruments while watching on the television screen. The thoracoscope may be removed and put into different areas as needed. When the procedure is finished, the surgeon expands the lung and puts one or more chest tubes in the chest. The chest tubes allow the lung to expand and allow fluid (drainage) to come out. The remaining incisions are closed with stitches (sutures) or staples. AFTER THE PROCEDURE    The chest tube is left in place for 1 to several days to drain fluid or air from the chest cavity.     Hospital stays range from 1 to 5 days depending on the procedure and treatment.  Document Released: 01/28/2003 Document Revised: 01/11/2011 Document Reviewed: 10/19/2010 [facility] Patient Information 2012 [place]   .Lung Resection (Pulmonary Surgery) A lung resection is a surgical procedure in which a lung is removed. When an entire lung is removed, the procedure is called a pneumonectomy. If only part of a lung is removed, it is called a lobectomy. This is typically done to get rid of a tumor or cancer; however, not all lung tumors are cancerous. It is used to relieve some or all of the symptoms that a person is feeling. The surgery can help keep the problem from getting worse. It may provide the best chance for curing your disease. Most people need to stay in the hospital for several days after this procedure. Although your symptoms may improve after the operation, it is important to remember that it is not necessarily a cure for lung cancer (if that is the problem). BEFORE THE PROCEDURE   In order to best prepare for surgery, your  caregiver may ask for several tests to be done. These may include:  Blood tests.     Urine testing.     X-rays.    Imaging, such as CT scan, MRI, and Positron emission testing to help to know the exact size and location of the tumor that will be removed.     Pulmonary function testing- breathing tests to know the exact function of your lungs before the operation and to know how to best help your breathing after the operation.     Heart testing- to make sure your heart is strong enough for the operation.     Bronchoscopy- a technique that allows your caregiver to look at the inside of the airways. This is done using a soft, flexible tube instrument called a bronchoscope. When used along with imaging, this can help your caregiver know the exact location and size of the area that will be removed at surgery.  LET YOUR CAREGIVERS KNOW ABOUT:  Allergies.     Medications taken including herbs, eye drops, prescription medications, over the counter medications, and creams.     Use of steroids (by mouth or creams).     Use of aspirin or blood-thinning medicines.     History of bleeding or blood problems.     Previous problems with anesthetics or novocaine.     Possibility of pregnancy, if this applies.     History of blood clots (thrombophlebitis).     Previous surgery.     Other health problems.  RISKS AND COMPLICATIONS   Lobectomies and pneumonectomies have been done for many years with good results and few complications. However, all surgery is associated with possible risks. Some of these risks are:  Excessive bleeding.     Infection.    Inability to breath without a ventilator.     Persistent shortness of breath.     Heart problems.     Blood clots.     Injury to a blood vessel.     Injury to a nerve.     Large, thick scars (called keloids) during healing.     Failure to heal properly.     Stroke.    Bronchopleural fistula (a small hole between one of the main  breathing tubes and the space between the lung and inside wall of the chest).  PROCEDURE  On the day of surgery, an IV (intravenous line connected to your vein for giving fluids) will be started and you will be given a medications and gas to make you sleep (general anesthetic).     After the anesthesia has taken effect, a breathing tube is placed into your windpipe. You may also get spinal anesthesia. This is used to decrease your pain after the surgery. For this type of pain relief, a thin, flexible tube called a catheter is put through your skin and next to your spinal cord, where it releases anesthetic.     You will then be turned onto your side. This makes it easier for your surgeon to reach the area of your ribcage where the cut (incision) will be made. This area is washed with a special disinfectant solution, and might also be shaved. A catheter, which is a plastic tube, will be put into your bladder. Another tube will be carefully passed through your throat and into your stomach.     The surgeon will make an incision on your side, which will start between two of your ribs and go around to your back. Your ribs will be spread and held open, and part of one rib may be removed to make it easier for the surgeon to reach your lung.     He or she will then carefully cut the veins, arteries, and bronchus leading to the lung (or the lobe of the lung) that is being taken out. After being cut, each of these will carefully be sewn closed and the lung (lobe) will be removed. After this, your surgeon will check inside your chest to make sure that there is no bleeding in or around the lungs. Lymph nodes near the lung may also be removed for later tests. This is to help know if there has been any spread of the problem.     Depending on your situation, your surgeon may put tubes into your chest to drain extra fluid and air from the chest cavity after the surgery. After the tubes are in, your ribcage will be  closed with heavy-duty stitches. The stitches help your ribcage heal and keep it from moving. After this, the layers of tissue under the skin are closed with more stitches, which will dissolve inside your body over time. Finally, your skin is stitched closed and covered with a bandage.  AFTER THE PROCEDURE    After surgery, you will be taken to the recovery area where a nurse will monitor your progress. You may still have the breathing tube, spinal and bladder catheters, stomach tube, and possibly the chest tubes inside your body. These will be removed during your recovery. You may be put on a respirator following surgery if some assistance is needed to help your breathing. You may discuss this with your surgeon. When you are awake, are stable, and without complications, you will usually continue recovery in the Intensive Care Unit (ICU).     Your doctor will talk about your surgery with your family or friends. They will be able to visit you in the ICU at scheduled times. They should expect to see you surrounded by monitors and other special equipment.     As you wake up, you might feel some aches and pains in your chest and throat. Sometimes during recovery, patients may shiver or feel sick to their stomach. Both of these symptoms are temporary and may be caused by the anesthesia. Your healthcare providers can give you medication to help these problems go away.     You may still have a breathing tube in place after the surgery. Some people find it a little strange to breathe with the tube in at first, but it usually feels more normal after doing it for a little while. The breathing tube will be taken out as soon as your healthcare team feels that you can breathe on your own. For most people, this happens on the same day as the surgery.     If your surgery and time in the ICU go well, most of the tubes and special equipment will be taken out within the first one to two days. This is about how long most  people stay in the ICU. You may need to stay longer, depending on how you are doing.     You should also start respiratory therapy in the ICU. This therapy uses breathing exercises to help your other lung stay healthy and get stronger.     As you improve, you will be moved to a regular hospital room for continued respiratory therapy, help with your bladder and bowels, and to continue medications. Most people stay in the hospital for 5 to 7 days after a pnuemonectomy. However, it's possible that your stay may be longer, depending on how your surgery went and how well you are doing. Your healthcare providers will regularly update you and your family about your recovery.     After your lung (or the lobe) is taken out, there will be a space inside your chest where your lung used to be. This space will fill up with fluid over time. The amount of time this takes is different for each person. Because your chest needs to fill with fluid, your surgeon may or may not put a drainage tube in your chest. If there is a chest tube, it will most likely be removed within 24 hours after the operation.     You will receive post-operative care until you are doing well and your caregiver feels it is safe for you to be transferred home or to an  extended care facility.  HOME CARE INSTRUCTIONS    DO NOT SMOKE or use tobacco-containing products. If you had been taking medicine to help you stop smoking before surgery, talk to your doctor about when you can re-start that medicine.     You may resume normal diet and activities as directed or allowed.     Keep ice packs (a bag of ice wrapped in a towel) on the surgical area for 15 to 20 minutes, 3 to 4 times per day, for the first two days following surgery unless directed otherwise.     Change dressings if necessary or as directed.     Only take over-the-counter or prescription medicines for pain, discomfort, or fever as directed by your caregiver.     Keep appointments as  directed.     Try to deep breathe and cough as directed. Splinting your chest with a pillow by holding a pillow firmly over your ribs or wound to limit motion when you deep breathe and cough may help with discomfort.  SEEK MEDICAL CARE IF:    There is increasing pain in the operative site that is not controlled by medications.     You develop an oral temperature of 100.5 F (38.1 C).     You feel you are having side effects from medications.     You are having difficulty with management of any tubes that have been temporarily left in place after the operation.  SEEK IMMEDIATE MEDICAL CARE IF:    You notice redness, swelling, or increasing pain in the wound.     There is pus coming from wound.     An unexplained oral temperature above 102 F (38.9 C) develops.     A bad smell is coming from the wound or dressing.     There is a breaking open of the wound (edges not staying together) after sutures or staples have been removed.     You have shortness of breath, lightheadedness or if you have a fainting episode.     You develop chest pain and/or irregular or rapid heart beat.     You cough up of blood or pus from your chest or if you develop a cough that is more productive of sputum that is foul-smelling or pus-like.     You have pain or swelling in the legs.  Document Released: 07/22/2002 Document Revised: 11/24/2010 Document Reviewed: 03/27/2007 [facility] Patient Information 2012 [place].  Lung Cancer Lung cancer is a tumor which starts as a growth in your lungs. Cancer is a group of many related diseases that begin in cells, the building blocks of the body. Normally, cells grow and divide to produce more cells only when the body needs them. Sometimes cells keep dividing when new cells are not needed. These extra cells may form a mass of tissue called a growth or tumor. Tumors can be either benign (not cancerous) or malignant (cancerous). Cancer can begin in any organ or  tissue of the body. The original tumor (where the tumor started out) is called the primary cancer and is usually named for where it begins.   Lung cancer is the most common cause of cancer death in men and women. There are several different types of lung cancers. Usually, lung cancer is described as either small-cell lung cancer or non-small-cell lung cancer. Other types of cancer occur in the lungs, including carcinoid and cancers spread from other organs. The types of cancer have different behavior and treatment. CAUSES  This cancer usually starts when the lungs are exposed to harmful chemicals. When you quit smoking, your risk of lung cancer falls each year (but is never the same as a person who has never smoked).   Other risks include:   Radon gas exposure.     Asbestos and other industrial substance exposure.     Second hand tobacco smoke.     Air pollution.     Family or personal history of lung cancer.     Age over 16.  SYMPTOMS   Lung cancer can cause many symptoms. They depend on the type of cancer, its location and other factors. Symptoms of lung cancer can include:  Cough (either new, different or more severe).     Shortness of breath.     Coughing up blood (hemoptysis).     Chest pain.     Hoarseness.    Swelling of the face.     Drooping eyelid.     Changes in blood tests: low sodium (hyponatremia), high calcium (hypercalcemia) or low blood count (anemia).     Weight loss.  In its early stages, lung cancer may not have symptoms and can be discovered by accident. Many of the symptoms above can be caused by diseases other than lung cancer. DIAGNOSIS   In early lung cancer, the patient often does not notice problems. It usually has spread by the time problems are first noticed. Your caregiver may suspect lung cancer based on your symptoms, your exam or based on tests (such as x-rays) obtained for other reasons. Common tests that help your caregiver diagnose your  condition include:  Chest x-ray.     CT scan of the lungs and chest.     Blood tests.  If a tumor is found, a biopsy will be necessary to confirm that cancer is present and to determine the type of cancer. TREATMENT    Surgery offers a hope for a cure if the cancer has not spread and the cancer is not a small cell (oat cell) cancer of the lung. Surgery cannot cure the small cell type of cancer.     Radiation Therapy is a form of high energy X-ray that helps slow or kill the cancer. It is often used along with medications (chemotherapy) to help treat the cancer and control pain.     Chemotherapy is used in combination with surgery in advanced cancer. It is also used in all small cell cancers.     Many new treatments look promising.     Your caregiver can give you more information and discuss treatment options that are best for your type of cancer.  HOME CARE INSTRUCTIONS    If you smoke, stop!     Take all medications as told.     Keep all appointments with your caregiver and other specialists.     Ask your caregiver if you should see a cancer specialist, if that has not been arranged.     If you require oxygen or breathing equipment, be sure you know how to use it and who to call with questions.     Follow any special diet directions. If you have problems with appetite, ask your caregiver for help.  SEEK MEDICAL CARE IF:    You have had a surgical procedure are you are having trouble recovering.     You have ongoing weight loss.     You have decreased strength or energy past the point when your caregiver said you would feel  better.     You develop nausea or lightheadedness.     You have pain that is not improving.  SEEK IMMEDIATE MEDICAL CARE IF:    You cough up clotted blood or bright red blood.     Your pain is uncontrolled.     You develop new difficulty breathing or chest pain.     You develop swelling in one or both ankles or legs, or swelling in your face or  neck.     You develop new headache or confusion.  Document Released: 08/07/2000 Document Revised: 08/05/2010 Document Reviewed: 08/07/2007 [facility] Patient Information 2012 [place].

## 2011-05-19 ENCOUNTER — Other Ambulatory Visit: Payer: Self-pay | Admitting: Cardiothoracic Surgery

## 2011-05-19 ENCOUNTER — Other Ambulatory Visit: Payer: Self-pay

## 2011-05-19 ENCOUNTER — Encounter (HOSPITAL_COMMUNITY): Payer: Self-pay | Admitting: Pharmacy Technician

## 2011-05-19 ENCOUNTER — Encounter: Payer: Self-pay | Admitting: *Deleted

## 2011-05-19 DIAGNOSIS — D497 Neoplasm of unspecified behavior of endocrine glands and other parts of nervous system: Secondary | ICD-10-CM

## 2011-05-19 DIAGNOSIS — R51 Headache: Secondary | ICD-10-CM

## 2011-05-19 DIAGNOSIS — D381 Neoplasm of uncertain behavior of trachea, bronchus and lung: Secondary | ICD-10-CM

## 2011-05-19 NOTE — Progress Notes (Signed)
Late note from 05/18/11 - Rn received referral from Kenton Kingfisher, Geophysicist/field seismologist.  Dr. Tammi Klippel wanted a research nurse to review the pt's chart for eligibility for the RTOG 1021 study.  The pt's chart was reviewed by 2 research nurses.  Upon the initial review, the pt appeared eligible.  However, the research nurse did not have the pt's PFT's to review.  Research nurse took Norton Blizzard, Chief Technology Officer, the initial eligibility review and the consent form with the research nurse's business card.  The pt was coming to thoracic clinic on 05/18/11.  On 05/19/11, Hinton Dyer said that Dr. Tammi Klippel and Dr. Julien Nordmann reviewed the eligibility and stated that her PFT's were too good to qualify for the study.  The pt was also not interested in the study.

## 2011-05-21 ENCOUNTER — Encounter: Payer: Self-pay | Admitting: Cardiothoracic Surgery

## 2011-05-21 NOTE — Progress Notes (Signed)
[address] City[address]            [place] [address]          [place] Record #[id number] Date of Birth: 1930/05/11  Referring: Dr Linde Gillis, Pulmonary Assoc Primary Care: [name], MD  Chief Complaint:    Chief Complaint  Patient presents with  . Lung Mass    referred by Dr. Linde Gillis...PET    History of Present Illness:    Patient is 78yo female who had rt posterior chest pain in early December. Pain was pleuritic in nature. When became worse she went to ER in [place]. A Ct of the chest was done that showed a 2.1x 2.2 x 3 cm lesion of the right upper Lobe of the lung. Since she notes the pain has improved. Further workup for poss Lung cancer has been done an patient is referred for evaluation  by thoracic surgery.   The patient is very active and functional, lives alone and cares for herself including yard work and cutting her grass with push mower. Denies weight loss,fatigue, cough, hemoptysis, fever or chills. 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    Current Activity/ Functional Status: Patient is independent with mobility/ambulation, transfers, ADL's, IADL's. Grade 0 MMRC Dyspnea Scale   Past Medical History  Diagnosis Date  . Thyroid disease   . Cataract of both eyes   . Arrhythmia     Cardiolite stress study 2011 normal LV function and no ischemia. Holter monitor June 2011--normal sinus rhythm on flecainide therapy  . RLQ abdominal pain   . Blood in stool   . Mucus in stool   . Campath-induced atrial fibrillation   . Chronic diarrhea     Past Surgical History  Procedure Date  . Cataract extraction   . Bladder tack   . Colonoscopy 3 YRS AGO  . Thyroid surgery   . Tonsillectomy   . Colonoscopy 03/06/2011    Procedure: COLONOSCOPY;  Surgeon: Rogene Houston, MD;  [facility];  Service: Endoscopy;   Laterality: N/A;  7:30 am    Family History  Problem Relation Age of Onset  . Stroke Mother   . Stroke Father   . Stroke Other     two siblings  . Heart disease Other     sibling  . Liver disease Paternal Grandmother     History   Social History  . Marital Status: Widowed    Spouse Name: N/A    Number of Children: N/A  . Years of Education: N/A   Occupational History  . 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 Smoking status: Never Smoker   . Smokeless tobacco: Never Used  . Alcohol Use: No  . Drug Use: No  . Sexually Active: Not on file    Social History Narrative   widowRetired from textiles    History  Smoking status  . Never Smoker   Smokeless tobacco  . Never Used    History  Alcohol Use No     Allergies  Allergen Reactions  . Doxycycline Swelling    SEVERE FACIAL SWELLING AND BURNING  . Entocort Ec (Budesonide)     Patient saw her PCP, Dr. Eula Fried and he made  Dr. Laural Golden aware that he had d/c patient taking Entocort due to the side effects she was experiencing. SEVERE FACIAL BURNING  . Chocolate Other (See Comments)    Causes migraines  . Codeine     REACTION: nausea  . Morphine And Related Nausea Only  . Penicillins Other (See Comments)    Childhood allergy    Current Outpatient Prescriptions  Medication Sig Dispense Refill  . acetaminophen (TYLENOL) 650 MG CR tablet Take 650 mg by mouth every 8 (eight) hours as needed. For pain      . ALPRAZolam (XANAX) 0.5 MG tablet Take 0.5 mg by mouth at bedtime as needed. For sleep      . diclofenac sodium (VOLTAREN) 1 % GEL Apply 1 application topically 2 (two) times daily as needed. For tight muscles in shoulder      . fish oil-omega-3 fatty acids 1000 MG capsule Take 1,200 g by mouth daily.       . Multiple Vitamins-Minerals (HAIR/SKIN/NAILS PO) Take 1 tablet by mouth daily.       . Probiotic Product (FLORAJEN3 PO) Take 1 tablet by mouth daily.        . psyllium (METAMUCIL) 0.52 G capsule Take 0.52 g by mouth daily.       Marland Kitchen diltiazem (CARDIZEM CD) 120 MG 24 hr capsule Take 120 mg by mouth daily.        Marland Kitchen warfarin (COUMADIN) 5 MG tablet Take 5 mg by mouth as directed. 59m Tues, Thurs, Sat.,  7.549mMon, Wed, Fri, Sun            Review of Systems:     Cardiac Review of Systems: Y or N  Chest Pain [  y  ]  Resting SOB [  n ] Exertional SOB  [ mild ]  Orthopnea [n  ]   Pedal Edema [ n  ]    Palpitations [ y ] Syncope  [n  ]   Presyncope [n   ]  General Review of Systems: [Y] = yes [  ]=no Constitional: recent weight change [  ]; anorexia [  ]; fatigue [  ]; nausea [  ]; night sweats [  ]; fever [  ]; or chills [  ];                                                                                                                                          Dental: poor dentition[n  ];   Eye : blurred vision [  ]; diplopia [   ]; vision changes [  ];  Amaurosis fugax[  ]; Resp: cough [n  ];  wheezing[n  ];  hemoptysis[  n]; shortness of breath[mild  ]; paroxysmal nocturnal dyspnea[ n ]; dyspnea on exertion[n  ]; or orthopnea[n  ];  GI:  gallstones[  ], vomiting[  ];  dysphagia[  ]; melena[  ];  hematochezia [  ]; heartburn[n  ];   Hx of  Colonoscopy[ y ]; GU: kidney stones [ n ]; hematuria[ n ];   dysuria [n  ];  nocturia[  ];  history of     obstruction [  ];             Skin: rash, swelling[n  ];, hair loss[  ];  peripheral edema[n  ];  or itching[  ]; Musculosketetal: myalgias[  ];  joint swelling[  ];  joint erythema[  ];  joint pain[  ];  back pain[  ];  Heme/Lymph: bruising[  ];  bleeding[  ];  anemia[  ];  Neuro: TIA[  ];  headaches[  ];  stroke[  ];  vertigo[  ];  seizures[  ];   paresthesias[  ];  difficulty walking[  ];  Psych:depression[  ]; anxiety[  ];  Endocrine: diabetes[  ];  thyroid dysfunction[ y ];  Immunizations: Flu Blue.Reese  ]; Pneumococcal[ y ];  Other:  Physical Exam: BP 132/81  Pulse 77  Resp 16  Ht 5' 3"  (1.6 m)  Wt  119 lb (53.978 kg)  BMI 21.08 kg/m2  SpO2 98%  General appearance: alert, cooperative, appears stated age and no distress Neurologic: intact Heart: irregularly irregular rhythm Lungs: clear to auscultation bilaterally Abdomen: soft, non-tender; bowel sounds normal; no masses,  no organomegaly Extremities: extremities normal, atraumatic, no cyanosis or edema, Homans sign is negative, no sign of DVT, no edema, redness or tenderness in the calves or thighs and no ulcers, gangrene or trophic changes No caoritid Bruits, no cervical adenotathy   Diagnostic Studies & Laboratory data:     Recent Radiology Findings:    *RADIOLOGY REPORT*   Clinical Data:  Initial treatment strategy for lung mass.   NUCLEAR MEDICINE PET CT INITIAL (PI) SKULL BASE TO THIGH   Technique:  18.3 mCi F-18 FDG was injected intravenously via the right antecubital fossa.  Full-ring PET imaging was performed from the skull base through the mid-thighs 02/16  minutes after injection.  CT data was obtained and used for attenuation correction and anatomic localization only.  (This was not acquired as a diagnostic CT examination.)   Fasting Blood Glucose:  109   Patient Weight:  119 pounds.   Comparison:  None.   Findings: There is uptake in the thyroid bilaterally, which appears heterogeneous, enlarged and nodular on CT images.  Posterior segment right upper lobe nodule measures 1.9 x 2.2 cm, with an S U V max of 2.9.  No additional areas of abnormal hypermetabolism in the neck, chest, abdomen or pelvis.   CT images were performed for attenuation correction. No acute findings in the neck.  No pericardial or pleural effusion.  There may be a punctate stone in the right kidney.  Atherosclerotic calcification of the arterial vasculature without aneurysm. Calcifications associated with the uterus are likely within fibroids.   IMPRESSION:   1.  Right upper lobe nodular airspace consolidation has  borderline malignant hypermetabolism. Findings are worrisome for low grade adenocarcinoma.  An infectious/inflammatory process cannot be definitively excluded.  Comparison with prior exams would be helpful, if available.  If prior exams are not available, follow-up CT chest without contrast in 4-6 weeks could be performed.  If persistent, thoracic surgery consultation is recommended. 2.  Enlarged, nodular and heterogeneous thyroid, with associated hypermetabolism.  Thyroid ultrasound is recommended, if not already performed elsewhere, as clinically indicated.   Original Report Authenticated By: Luretha Rued, M.D.  PFTS- Done at [facility] scanned in to Record FEv1 2.16 DLCO normal  Recent Lab Findings: Lab Results  Component Value Date   INR 2.5 05/12/2011      Assessment / Plan:     Right Upper Lobe lung mass suggestive of low grade malignancy, without chest wall involvement. No Tissue DX Chronic Afib on Coumadin Excellent Functional status and PFTs Hypertension Thyroid Disease-history of   I have discussed with Patient and her son the findings of CT and PET that suggest but are not conclusive for Pulmonary malignancy. The option of needle bx then VATS vs proceeding with VATS and resection was discussed. Since the patient is a reasonable operative candidate and if the needle bx was positive for malignancy or inconclusive the recommend for VATS/resection would be the same the patient wishes to proceed with resection.  Preop we will obtain MRI of the head to ro METS Thyroid Ultrasound as suggested by the Radiology on the PET findings  The goals risks and alternatives of the planned surgical procedure Bronscopy  Right VATS & Lung resection  have been discussed with the patient in detail. The risks of the procedure including death, infection, prolonged air leak stroke, myocardial infarction, bleeding, blood transfusion have all been discussed specifically.  I have  quoted Shaaron Adler a 4% of perioperative mortality and a complication rate as high as 15%. The patient's questions have been answered.Benjamin AVYANA PUFFENBARGER is willing  to proceed with the planned procedure.  She will stop coumadin January 11 for surgery January 16     Grace Isaac MD  Beeper [phone] Office [phone] 05/21/2011 12:31 PM

## 2011-05-24 ENCOUNTER — Other Ambulatory Visit (HOSPITAL_COMMUNITY): Payer: Medicare Other

## 2011-05-26 ENCOUNTER — Other Ambulatory Visit: Payer: Self-pay | Admitting: Cardiothoracic Surgery

## 2011-05-26 DIAGNOSIS — R51 Headache: Secondary | ICD-10-CM

## 2011-05-26 DIAGNOSIS — D381 Neoplasm of uncertain behavior of trachea, bronchus and lung: Secondary | ICD-10-CM

## 2011-05-29 ENCOUNTER — Ambulatory Visit (HOSPITAL_COMMUNITY)
Admission: RE | Admit: 2011-05-29 | Discharge: 2011-05-29 | Disposition: A | Discharge status: Home / Self Care | Payer: Medicare Other | Source: Ambulatory Visit | Attending: Cardiothoracic Surgery | Admitting: Cardiothoracic Surgery

## 2011-05-29 ENCOUNTER — Other Ambulatory Visit: Payer: Self-pay

## 2011-05-29 ENCOUNTER — Encounter (HOSPITAL_COMMUNITY): Payer: Self-pay

## 2011-05-29 ENCOUNTER — Encounter (HOSPITAL_COMMUNITY)
Admission: RE | Admit: 2011-05-29 | Discharge: 2011-05-29 | Disposition: A | Discharge status: Home / Self Care | Payer: Medicare Other | Source: Ambulatory Visit | Attending: Cardiothoracic Surgery | Admitting: Cardiothoracic Surgery

## 2011-05-29 DIAGNOSIS — E042 Nontoxic multinodular goiter: Secondary | ICD-10-CM | POA: Insufficient documentation

## 2011-05-29 DIAGNOSIS — D381 Neoplasm of uncertain behavior of trachea, bronchus and lung: Secondary | ICD-10-CM

## 2011-05-29 DIAGNOSIS — D497 Neoplasm of unspecified behavior of endocrine glands and other parts of nervous system: Secondary | ICD-10-CM

## 2011-05-29 DIAGNOSIS — R222 Localized swelling, mass and lump, trunk: Secondary | ICD-10-CM | POA: Insufficient documentation

## 2011-05-29 DIAGNOSIS — R51 Headache: Secondary | ICD-10-CM

## 2011-05-29 DIAGNOSIS — J3489 Other specified disorders of nose and nasal sinuses: Secondary | ICD-10-CM | POA: Insufficient documentation

## 2011-05-29 HISTORY — DX: Hyperlipidemia, unspecified: E78.5

## 2011-05-29 HISTORY — DX: Personal history of other diseases of the nervous system and sense organs: Z86.69

## 2011-05-29 HISTORY — DX: Nonexudative age-related macular degeneration, unspecified eye, stage unspecified: H35.3190

## 2011-05-29 HISTORY — DX: Noninfective gastroenteritis and colitis, unspecified: K52.9

## 2011-05-29 HISTORY — DX: Personal history of colonic polyps: Z86.010

## 2011-05-29 HISTORY — DX: Personal history of colon polyps, unspecified: Z86.0100

## 2011-05-29 HISTORY — DX: Unspecified osteoarthritis, unspecified site: M19.90

## 2011-05-29 LAB — COMPREHENSIVE METABOLIC PANEL
ALT: 14 U/L (ref 0–35)
ALT: 14 U/L (ref 0–35)
AST: 19 U/L (ref 0–37)
AST: 22 U/L (ref 0–37)
Albumin: 3.6 g/dL (ref 3.5–5.2)
Albumin: 3.8 g/dL (ref 3.5–5.2)
Alkaline Phosphatase: 84 U/L (ref 39–117)
Alkaline Phosphatase: 91 U/L (ref 39–117)
BUN: 11 mg/dL (ref 6–23)
BUN: 12 mg/dL (ref 6–23)
CO2: 31 mEq/L (ref 19–32)
CO2: 23 mEq/L (ref 19–32)
Calcium: 9.2 mg/dL (ref 8.4–10.5)
Calcium: 9.4 mg/dL (ref 8.4–10.5)
Chloride: 104 mEq/L (ref 96–112)
Chloride: 102 mEq/L (ref 96–112)
Creatinine, Ser: 0.76 mg/dL (ref 0.50–1.10)
Creatinine, Ser: 0.6 mg/dL (ref 0.50–1.10)
GFR calc Af Amer: >90 mL/min (ref >90)
GFR calc Af Amer: >90 mL/min (ref >90)
GFR calc non Af Amer: 78 mL/min — ABNORMAL LOW (ref >90)
GFR calc non Af Amer: 84 mL/min — ABNORMAL LOW (ref >90)
Glucose, Bld: 104 mg/dL — ABNORMAL HIGH (ref 70–99)
Glucose, Bld: 100 mg/dL — ABNORMAL HIGH (ref 70–99)
Potassium: 3.9 mEq/L (ref 3.5–5.1)
Potassium: 4.3 mEq/L (ref 3.5–5.1)
Sodium: 143 mEq/L (ref 135–145)
Sodium: 136 mEq/L (ref 135–145)
Total Bilirubin: 0.7 mg/dL (ref 0.3–1.2)
Total Bilirubin: 0.6 mg/dL (ref 0.3–1.2)
Total Protein: 7.2 g/dL (ref 6.0–8.3)
Total Protein: 7.2 g/dL (ref 6.0–8.3)

## 2011-05-29 LAB — CBC
HCT: 42.9 % (ref 36.0–46.0)
Hemoglobin: 14.7 g/dL (ref 12.0–15.0)
MCH: 30.2 pg (ref 26.0–34.0)
MCHC: 34.3 g/dL (ref 30.0–36.0)
MCV: 88.1 fL (ref 78.0–100.0)
Platelets: 228 10*3/uL (ref 150–400)
RBC: 4.87 MIL/uL (ref 3.87–5.11)
RDW: 13.6 % (ref 11.5–15.5)
WBC: 5.1 10*3/uL (ref 4.0–10.5)

## 2011-05-29 LAB — URINALYSIS, ROUTINE W REFLEX MICROSCOPIC
Bilirubin Urine: NEGATIVE
Glucose, UA: NEGATIVE mg/dL
Ketones, ur: NEGATIVE mg/dL
Leukocytes, UA: NEGATIVE
Nitrite: NEGATIVE
Protein, ur: NEGATIVE mg/dL
Specific Gravity, Urine: 1.017 (ref 1.005–1.030)
Urobilinogen, UA: 0.2 mg/dL (ref 0.0–1.0)
pH: 7 (ref 5.0–8.0)

## 2011-05-29 LAB — TYPE AND SCREEN
ABO/RH(D): A POS
Antibody Screen: NEGATIVE
Unit division: 0
Unit division: 0

## 2011-05-29 LAB — SURGICAL PCR SCREEN
MRSA, PCR: NEGATIVE
Staphylococcus aureus: NEGATIVE

## 2011-05-29 LAB — BLOOD GAS, ARTERIAL
Acid-base deficit: 0.1 mmol/L (ref 0.0–2.0)
Bicarbonate: 23.6 mEq/L (ref 20.0–24.0)
Drawn by: 206361
FIO2: 0.21 %
O2 Saturation: 97.5 %
Patient temperature: 98.6
TCO2: 24.7 mmol/L (ref 0–100)
pCO2 arterial: 35.9 mmHg (ref 35.0–45.0)
pH, Arterial: 7.433 — ABNORMAL HIGH (ref 7.350–7.400)
pO2, Arterial: 91.5 mmHg (ref 80.0–100.0)

## 2011-05-29 LAB — APTT: aPTT: 29 seconds (ref 24–37)

## 2011-05-29 LAB — URINE MICROSCOPIC-ADD ON

## 2011-05-29 LAB — PROTIME-INR
INR: 1.17 (ref 0.00–1.49)
Prothrombin Time: 15.1 seconds (ref 11.6–15.2)

## 2011-05-29 LAB — ABO/RH: ABO/RH(D): A POS

## 2011-05-29 MED ORDER — GADOBENATE DIMEGLUMINE 529 MG/ML IV SOLN
11.0000 mL | Freq: Once | INTRAVENOUS | Status: AC
  Administered 2011-05-29: 11 mL via INTRAVENOUS

## 2011-05-29 NOTE — Pre-Procedure Instructions (Signed)
[name]  05/29/2011   Your procedure is scheduled on:  Wed,Jan 16 @ 0830  Report to [place] at [place].  Call this number if you have problems the morning of surgery: [phone]   Remember:   Do not eat food:After Midnight.  May have clear liquids: up to 4 Hours before arrival.(until 2:30am)  Clear liquids include soda, tea, black coffee, apple or grape juice, broth.  Take these medicines the morning of surgery with A SIP OF WATER: Diltiazem   Do not wear jewelry, make-up or nail polish.  Do not wear lotions, powders, or perfumes. You may wear deodorant.  Do not shave 48 hours prior to surgery.  Do not bring valuables to the hospital.  Contacts, dentures or bridgework may not be worn into surgery.  Leave suitcase in the car. After surgery it may be brought to your room.  For patients admitted to the hospital, checkout time is 11:00 AM the day of discharge.   Patients discharged the day of surgery will not be allowed to drive home.  Name and phone number of your driver:   Special Instructions: CHG Shower Use Special Wash: 1/2 bottle night before surgery and 1/2 bottle morning of surgery.   Please read over the following fact sheets that you were given: Pain Booklet, Coughing and Deep Breathing, Blood Transfusion Information, MRSA Information and Surgical Site Infection Prevention

## 2011-05-29 NOTE — Progress Notes (Signed)
Dr.Degent is cardiologist;last visit was 76month ago;had an appointment for tomorrow but rescheduled until after surgery  [facility]

## 2011-05-29 NOTE — Progress Notes (Signed)
Stopped coumadin on the 10th as instructed to do

## 2011-05-30 ENCOUNTER — Ambulatory Visit: Payer: Medicare Other | Admitting: Cardiology

## 2011-05-30 MED ORDER — VANCOMYCIN HCL IN DEXTROSE 1-5 GM/200ML-% IV SOLN
1000.0000 mg | INTRAVENOUS | Status: AC
  Administered 2011-05-31: 1000 mg via INTRAVENOUS
  Filled 2011-05-30 (×2): qty 200

## 2011-05-31 ENCOUNTER — Ambulatory Visit (HOSPITAL_COMMUNITY): Payer: Medicare Other

## 2011-05-31 ENCOUNTER — Other Ambulatory Visit: Payer: Self-pay | Admitting: Cardiothoracic Surgery

## 2011-05-31 ENCOUNTER — Encounter (HOSPITAL_COMMUNITY): Payer: Self-pay | Admitting: *Deleted

## 2011-05-31 ENCOUNTER — Encounter (HOSPITAL_COMMUNITY): Payer: Self-pay | Admitting: Anesthesiology

## 2011-05-31 ENCOUNTER — Inpatient Hospital Stay (HOSPITAL_COMMUNITY)
Admission: RE | Admit: 2011-05-31 | Discharge: 2011-06-08 | DRG: 164 | Disposition: A | Discharge status: Home / Self Care | Payer: Medicare Other | Source: Ambulatory Visit | Attending: Cardiothoracic Surgery | Admitting: Cardiothoracic Surgery

## 2011-05-31 ENCOUNTER — Encounter (HOSPITAL_COMMUNITY)
Admission: RE | Disposition: A | Discharge status: Home / Self Care | Payer: Self-pay | Source: Ambulatory Visit | Attending: Cardiothoracic Surgery

## 2011-05-31 ENCOUNTER — Ambulatory Visit (HOSPITAL_COMMUNITY): Payer: Medicare Other | Admitting: Anesthesiology

## 2011-05-31 DIAGNOSIS — Z7901 Long term (current) use of anticoagulants: Secondary | ICD-10-CM

## 2011-05-31 DIAGNOSIS — D381 Neoplasm of uncertain behavior of trachea, bronchus and lung: Secondary | ICD-10-CM

## 2011-05-31 DIAGNOSIS — I4891 Unspecified atrial fibrillation: Secondary | ICD-10-CM | POA: Diagnosis present

## 2011-05-31 DIAGNOSIS — Y836 Removal of other organ (partial) (total) as the cause of abnormal reaction of the patient, or of later complication, without mention of misadventure at the time of the procedure: Secondary | ICD-10-CM | POA: Diagnosis not present

## 2011-05-31 DIAGNOSIS — N390 Urinary tract infection, site not specified: Secondary | ICD-10-CM | POA: Diagnosis not present

## 2011-05-31 DIAGNOSIS — Z823 Family history of stroke: Secondary | ICD-10-CM

## 2011-05-31 DIAGNOSIS — Z01812 Encounter for preprocedural laboratory examination: Secondary | ICD-10-CM

## 2011-05-31 DIAGNOSIS — D62 Acute posthemorrhagic anemia: Secondary | ICD-10-CM | POA: Diagnosis not present

## 2011-05-31 DIAGNOSIS — H3530 Unspecified macular degeneration: Secondary | ICD-10-CM | POA: Diagnosis present

## 2011-05-31 DIAGNOSIS — J95811 Postprocedural pneumothorax: Secondary | ICD-10-CM | POA: Diagnosis not present

## 2011-05-31 DIAGNOSIS — Z860100 Personal history of colon polyps, unspecified: Secondary | ICD-10-CM

## 2011-05-31 DIAGNOSIS — Z888 Allergy status to other drugs, medicaments and biological substances status: Secondary | ICD-10-CM

## 2011-05-31 DIAGNOSIS — Z79899 Other long term (current) drug therapy: Secondary | ICD-10-CM

## 2011-05-31 DIAGNOSIS — E785 Hyperlipidemia, unspecified: Secondary | ICD-10-CM | POA: Diagnosis present

## 2011-05-31 DIAGNOSIS — Y9210 Unspecified residential institution as the place of occurrence of the external cause: Secondary | ICD-10-CM | POA: Diagnosis not present

## 2011-05-31 DIAGNOSIS — Z880 Allergy status to penicillin: Secondary | ICD-10-CM

## 2011-05-31 DIAGNOSIS — C3410 Malignant neoplasm of upper lobe, unspecified bronchus or lung: Principal | ICD-10-CM | POA: Diagnosis present

## 2011-05-31 DIAGNOSIS — Z881 Allergy status to other antibiotic agents status: Secondary | ICD-10-CM

## 2011-05-31 HISTORY — PX: VIDEO BRONCHOSCOPY: SHX5072

## 2011-05-31 SURGERY — BRONCHOSCOPY, VIDEO-ASSISTED
Anesthesia: General | Site: Chest | Laterality: Right

## 2011-05-31 MED ORDER — 0.9 % SODIUM CHLORIDE (POUR BTL) OPTIME
TOPICAL | Status: DC | PRN
  Administered 2011-05-31: 1000 mL

## 2011-05-31 MED ORDER — NALOXONE HCL 0.4 MG/ML IJ SOLN
0.4000 mg | INTRAMUSCULAR | Status: DC | PRN
  Filled 2011-05-31: qty 1

## 2011-05-31 MED ORDER — HYDROMORPHONE HCL PF 1 MG/ML IJ SOLN
0.2500 mg | INTRAMUSCULAR | Status: DC | PRN
  Administered 2011-05-31 (×2): 0.5 mg via INTRAVENOUS

## 2011-05-31 MED ORDER — DIPHENHYDRAMINE HCL 50 MG/ML IJ SOLN
12.5000 mg | Freq: Four times a day (QID) | INTRAMUSCULAR | Status: DC | PRN
  Filled 2011-05-31: qty 0.25

## 2011-05-31 MED ORDER — BUPIVACAINE 0.5 % ON-Q PUMP SINGLE CATH 300 ML
300.0000 mL | INJECTION | Status: AC
  Filled 2011-05-31 (×2): qty 300

## 2011-05-31 MED ORDER — ONDANSETRON HCL 4 MG/2ML IJ SOLN: 4.0000 mg | Freq: Four times a day (QID) | INTRAMUSCULAR | Status: DC | PRN

## 2011-05-31 MED ORDER — LACTATED RINGERS IV SOLN
INTRAVENOUS | Status: DC | PRN
  Administered 2011-05-31: 08:00:00 via INTRAVENOUS

## 2011-05-31 MED ORDER — MIDAZOLAM HCL 5 MG/5ML IJ SOLN
INTRAMUSCULAR | Status: DC | PRN
  Administered 2011-05-31: 1.5 mg via INTRAVENOUS
  Administered 2011-05-31: .5 mg via INTRAVENOUS

## 2011-05-31 MED ORDER — PROPOFOL 10 MG/ML IV EMUL
INTRAVENOUS | Status: DC | PRN
  Administered 2011-05-31: 160 mg via INTRAVENOUS

## 2011-05-31 MED ORDER — VANCOMYCIN HCL IN DEXTROSE 1-5 GM/200ML-% IV SOLN
1000.0000 mg | Freq: Two times a day (BID) | INTRAVENOUS | Status: AC
  Administered 2011-05-31: 1000 mg via INTRAVENOUS
  Filled 2011-05-31: qty 200

## 2011-05-31 MED ORDER — SENNOSIDES-DOCUSATE SODIUM 8.6-50 MG PO TABS
1.0000 | ORAL_TABLET | Freq: Every evening | ORAL | Status: DC | PRN
  Filled 2011-05-31: qty 1

## 2011-05-31 MED ORDER — DIPHENHYDRAMINE HCL 12.5 MG/5ML PO ELIX
12.5000 mg | ORAL_SOLUTION | Freq: Four times a day (QID) | ORAL | Status: DC | PRN
  Filled 2011-05-31: qty 5

## 2011-05-31 MED ORDER — DILTIAZEM HCL ER COATED BEADS 120 MG PO CP24
120.0000 mg | ORAL_CAPSULE | Freq: Every day | ORAL | Status: DC
  Administered 2011-05-31 – 2011-06-07 (×8): 120 mg via ORAL
  Filled 2011-05-31 (×9): qty 1

## 2011-05-31 MED ORDER — ROCURONIUM BROMIDE 100 MG/10ML IV SOLN
INTRAVENOUS | Status: DC | PRN
  Administered 2011-05-31: 50 mg via INTRAVENOUS

## 2011-05-31 MED ORDER — HEMOSTATIC AGENTS (NO CHARGE) OPTIME
TOPICAL | Status: DC | PRN
  Administered 2011-05-31: 1 via TOPICAL

## 2011-05-31 MED ORDER — FENTANYL 10 MCG/ML IV SOLN
INTRAVENOUS | Status: DC
  Administered 2011-05-31: 250 ug via INTRAVENOUS
  Administered 2011-06-01: 70 ug via INTRAVENOUS
  Administered 2011-06-01: 20 ug via INTRAVENOUS
  Administered 2011-06-01: 30 ug via INTRAVENOUS
  Administered 2011-06-01: 20 ug via INTRAVENOUS
  Administered 2011-06-01: 60 ug via INTRAVENOUS
  Administered 2011-06-02: 30 ug via INTRAVENOUS
  Administered 2011-06-02: 80 ug via INTRAVENOUS
  Administered 2011-06-02: 50 ug via INTRAVENOUS
  Administered 2011-06-02: 39 ug via INTRAVENOUS
  Administered 2011-06-03: 20 ug via INTRAVENOUS
  Administered 2011-06-03: 10 ug via INTRAVENOUS
  Administered 2011-06-03: 20 ug via INTRAVENOUS
  Filled 2011-05-31 (×2): qty 50

## 2011-05-31 MED ORDER — FENTANYL CITRATE 0.05 MG/ML IJ SOLN
INTRAMUSCULAR | Status: DC | PRN
  Administered 2011-05-31 (×3): 50 ug via INTRAVENOUS
  Administered 2011-05-31: 100 ug via INTRAVENOUS

## 2011-05-31 MED ORDER — ACETAMINOPHEN 10 MG/ML IV SOLN
1000.0000 mg | Freq: Four times a day (QID) | INTRAVENOUS | Status: AC
  Administered 2011-05-31 – 2011-06-01 (×4): 1000 mg via INTRAVENOUS
  Filled 2011-05-31 (×4): qty 100

## 2011-05-31 MED ORDER — SODIUM CHLORIDE 0.9 % IJ SOLN
9.0000 mL | INTRAMUSCULAR | Status: DC | PRN
  Administered 2011-06-01: 10 mL via INTRAVENOUS
  Administered 2011-06-01: 3 mL via INTRAVENOUS
  Administered 2011-06-04: 10 mL via INTRAVENOUS

## 2011-05-31 MED ORDER — PSYLLIUM 0.52 G PO CAPS: 0.5200 g | ORAL_CAPSULE | Freq: Every day | ORAL | Status: DC

## 2011-05-31 MED ORDER — SODIUM CHLORIDE 0.9 % IV SOLN
INTRAVENOUS | Status: DC
  Administered 2011-05-31 – 2011-06-03 (×3): via INTRAVENOUS

## 2011-05-31 MED ORDER — ONDANSETRON HCL 4 MG/2ML IJ SOLN
INTRAMUSCULAR | Status: DC | PRN
  Administered 2011-05-31: 4 mg via INTRAVENOUS

## 2011-05-31 MED ORDER — VECURONIUM BROMIDE 10 MG IV SOLR
INTRAVENOUS | Status: DC | PRN
  Administered 2011-05-31 (×6): 1 mg via INTRAVENOUS

## 2011-05-31 MED ORDER — PSYLLIUM 95 % PO PACK
1.0000 | PACK | Freq: Every day | ORAL | Status: DC
  Administered 2011-06-01 – 2011-06-07 (×6): 1 via ORAL
  Filled 2011-05-31 (×9): qty 1

## 2011-05-31 MED ORDER — POTASSIUM CHLORIDE 10 MEQ/50ML IV SOLN
10.0000 meq | Freq: Every day | INTRAVENOUS | Status: DC | PRN
  Administered 2011-06-03 (×3): 10 meq via INTRAVENOUS
  Filled 2011-05-31 (×4): qty 50

## 2011-05-31 MED ORDER — LACTATED RINGERS IV SOLN
INTRAVENOUS | Status: DC | PRN
  Administered 2011-05-31 (×3): via INTRAVENOUS

## 2011-05-31 MED ORDER — NEOSTIGMINE METHYLSULFATE 1 MG/ML IJ SOLN
INTRAMUSCULAR | Status: DC | PRN
  Administered 2011-05-31: 4 mg via INTRAVENOUS

## 2011-05-31 MED ORDER — GLYCOPYRROLATE 0.2 MG/ML IJ SOLN
INTRAMUSCULAR | Status: DC | PRN
  Administered 2011-05-31: .8 mg via INTRAVENOUS

## 2011-05-31 MED ORDER — ONDANSETRON HCL 4 MG/2ML IJ SOLN
4.0000 mg | Freq: Four times a day (QID) | INTRAMUSCULAR | Status: DC | PRN
  Administered 2011-06-01 – 2011-06-03 (×3): 4 mg via INTRAVENOUS
  Filled 2011-05-31 (×4): qty 2

## 2011-05-31 MED ORDER — PHENYLEPHRINE HCL 10 MG/ML IJ SOLN
10.0000 mg | INTRAVENOUS | Status: DC | PRN
  Administered 2011-05-31: 5 ug/min via INTRAVENOUS

## 2011-05-31 MED ORDER — BUPIVACAINE ON-Q PAIN PUMP (FOR ORDER SET NO CHG)
INJECTION | Status: DC
  Filled 2011-05-31: qty 1

## 2011-05-31 MED ORDER — HETASTARCH-ELECTROLYTES 6 % IV SOLN
INTRAVENOUS | Status: DC | PRN
  Administered 2011-05-31: 10:00:00 via INTRAVENOUS

## 2011-05-31 MED ORDER — PHENYLEPHRINE HCL 10 MG/ML IJ SOLN
INTRAMUSCULAR | Status: DC | PRN
  Administered 2011-05-31 (×2): 40 ug via INTRAVENOUS

## 2011-05-31 MED ORDER — TRAMADOL HCL 50 MG PO TABS
50.0000 mg | ORAL_TABLET | Freq: Four times a day (QID) | ORAL | Status: DC | PRN
  Administered 2011-06-02: 50 mg via ORAL
  Administered 2011-06-02: 25 mg via ORAL
  Administered 2011-06-04: 100 mg via ORAL
  Administered 2011-06-04 (×2): 50 mg via ORAL
  Administered 2011-06-05: 100 mg via ORAL
  Administered 2011-06-05: 50 mg via ORAL
  Administered 2011-06-05 – 2011-06-07 (×8): 100 mg via ORAL
  Filled 2011-05-31 (×5): qty 2
  Filled 2011-05-31 (×2): qty 1
  Filled 2011-05-31 (×4): qty 2
  Filled 2011-05-31: qty 1
  Filled 2011-05-31 (×2): qty 2
  Filled 2011-05-31: qty 1

## 2011-05-31 MED ORDER — FENTANYL 10 MCG/ML IV SOLN
INTRAVENOUS | Status: AC
  Administered 2011-05-31: 50 ug via INTRAVENOUS
  Filled 2011-05-31 (×2): qty 50

## 2011-05-31 SURGICAL SUPPLY — 88 items
APPLICATOR TIP COSEAL (VASCULAR PRODUCTS) IMPLANT
APPLICATOR TIP EXT COSEAL (VASCULAR PRODUCTS) IMPLANT
BLADE SURG 10 STRL SS (BLADE) ×3 IMPLANT
BRUSH CYTOL CELLEBRITY 1.5X140 (MISCELLANEOUS) IMPLANT
CANISTER SUCTION 2500CC (MISCELLANEOUS) ×3 IMPLANT
CATH KIT ON Q 10IN SLV (PAIN MANAGEMENT) ×3 IMPLANT
CATH KIT ON Q 5IN SLV (PAIN MANAGEMENT) IMPLANT
CATH THORACIC 28FR (CATHETERS) ×6 IMPLANT
CATH THORACIC 36FR (CATHETERS) IMPLANT
CATH THORACIC 36FR RT ANG (CATHETERS) IMPLANT
CLIP TI MEDIUM 6 (CLIP) IMPLANT
CLOTH BEACON ORANGE TIMEOUT ST (SAFETY) ×3 IMPLANT
CONN ST 1/4X3/8  BEN (MISCELLANEOUS) ×2
CONN ST 1/4X3/8 BEN (MISCELLANEOUS) ×4 IMPLANT
CONN Y 3/8X3/8X3/8  BEN (MISCELLANEOUS) ×1
CONN Y 3/8X3/8X3/8 BEN (MISCELLANEOUS) ×2 IMPLANT
CONT SPEC 4OZ CLIKSEAL STRL BL (MISCELLANEOUS) ×9 IMPLANT
COVER SURGICAL LIGHT HANDLE (MISCELLANEOUS) ×6 IMPLANT
COVER TABLE BACK 60X90 (DRAPES) ×3 IMPLANT
DRAPE CHEST BREAST 15X10 FENES (DRAPES) ×3 IMPLANT
DRAPE LAPAROSCOPIC ABDOMINAL (DRAPES) ×3 IMPLANT
DRAPE SLUSH MACHINE 52X66 (DRAPES) IMPLANT
DRAPE SLUSH/WARMER DISC (DRAPES) IMPLANT
DRILL BIT 7/64X5 (BIT) ×3 IMPLANT
ELECT CAUTERY BLADE 6.4 (BLADE) ×3 IMPLANT
ELECT REM PT RETURN 9FT ADLT (ELECTROSURGICAL) ×3
ELECTRODE REM PT RTRN 9FT ADLT (ELECTROSURGICAL) ×2 IMPLANT
FORCEPS BIOP RJ4 1.8 (CUTTING FORCEPS) IMPLANT
GAUZE SPONGE 4X4 16PLY XRAY LF (GAUZE/BANDAGES/DRESSINGS) ×3 IMPLANT
GLOVE BIO SURGEON STRL SZ 6.5 (GLOVE) ×6 IMPLANT
GOWN PREVENTION PLUS XLARGE (GOWN DISPOSABLE) ×3 IMPLANT
GOWN STRL NON-REIN LRG LVL3 (GOWN DISPOSABLE) ×12 IMPLANT
HANDLE STAPLE ENDO GIA SHORT (STAPLE) ×1
HEMOSTAT SURGICEL 2X14 (HEMOSTASIS) IMPLANT
KIT BASIN OR (CUSTOM PROCEDURE TRAY) ×3 IMPLANT
KIT ROOM TURNOVER OR (KITS) ×3 IMPLANT
KIT SUCTION CATH 14FR (SUCTIONS) ×6 IMPLANT
MARKER SKIN DUAL TIP RULER LAB (MISCELLANEOUS) ×3 IMPLANT
NEEDLE BIOPSY TRANSBRONCH 21G (NEEDLE) IMPLANT
NS IRRIG 1000ML POUR BTL (IV SOLUTION) ×6 IMPLANT
OIL SILICONE PENTAX (PARTS (SERVICE/REPAIRS)) ×3 IMPLANT
PACK CHEST (CUSTOM PROCEDURE TRAY) ×3 IMPLANT
PACK SURGICAL SETUP 50X90 (CUSTOM PROCEDURE TRAY) ×3 IMPLANT
PAD ARMBOARD 7.5X6 YLW CONV (MISCELLANEOUS) ×6 IMPLANT
PENCIL BUTTON HOLSTER BLD 10FT (ELECTRODE) ×3 IMPLANT
POUCH SPECIMEN RETRIEVAL 10MM (ENDOMECHANICALS) ×3 IMPLANT
RELOAD EGIA 45 MED/THCK PURPLE (STAPLE) ×54 IMPLANT
RELOAD EGIA TRIS TAN 45 CVD (STAPLE) ×6 IMPLANT
RELOAD STAPLE TA30 4.8 GRN (STAPLE) ×3 IMPLANT
SEALANT PROGEL (MISCELLANEOUS) ×3 IMPLANT
SEALANT SURG COSEAL 4ML (VASCULAR PRODUCTS) IMPLANT
SEALANT SURG COSEAL 8ML (VASCULAR PRODUCTS) IMPLANT
SOLUTION ANTI FOG 6CC (MISCELLANEOUS) IMPLANT
SPONGE GAUZE 4X4 12PLY (GAUZE/BANDAGES/DRESSINGS) ×3 IMPLANT
SPONGE GAUZE 4X4 STERILE 39 (GAUZE/BANDAGES/DRESSINGS) ×3 IMPLANT
SPONGE INTESTINAL PEANUT (DISPOSABLE) ×6 IMPLANT
SPONGE TONSIL 1 RF SGL (DISPOSABLE) ×3 IMPLANT
STAPLER ENDO GIA 12MM SHORT (STAPLE) ×2 IMPLANT
STAPLER TA30 4.8 NON-ABS (STAPLE) ×3 IMPLANT
SUT PROLENE 3 0 SH DA (SUTURE) IMPLANT
SUT PROLENE 4 0 RB 1 (SUTURE) ×2
SUT PROLENE 4-0 RB1 .5 CRCL 36 (SUTURE) ×4 IMPLANT
SUT PROLENE 5 0 C 1 36 (SUTURE) ×3 IMPLANT
SUT SILK  1 MH (SUTURE) ×2
SUT SILK 1 MH (SUTURE) ×4 IMPLANT
SUT SILK 2 0SH CR/8 30 (SUTURE) IMPLANT
SUT SILK 3 0SH CR/8 30 (SUTURE) IMPLANT
SUT VIC AB 1 CTX 18 (SUTURE) IMPLANT
SUT VIC AB 1 CTX 36 (SUTURE)
SUT VIC AB 1 CTX36XBRD ANBCTR (SUTURE) IMPLANT
SUT VIC AB 2-0 CTX 36 (SUTURE) IMPLANT
SUT VIC AB 3-0 SH 18 (SUTURE) ×3 IMPLANT
SUT VIC AB 3-0 X1 27 (SUTURE) ×3 IMPLANT
SUT VICRYL 2 TP 1 (SUTURE) IMPLANT
SWAB COLLECTION DEVICE MRSA (MISCELLANEOUS) IMPLANT
SYR 20ML ECCENTRIC (SYRINGE) ×3 IMPLANT
SYRINGE 10CC LL (SYRINGE) ×3 IMPLANT
SYSTEM SAHARA CHEST DRAIN ATS (WOUND CARE) ×3 IMPLANT
TAPE UMBILICAL 1/8 X36 TWILL (MISCELLANEOUS) ×3 IMPLANT
TIP APPLICATOR SPRAY EXTEND 16 (VASCULAR PRODUCTS) ×3 IMPLANT
TOWEL OR 17X24 6PK STRL BLUE (TOWEL DISPOSABLE) ×3 IMPLANT
TOWEL OR 17X26 10 PK STRL BLUE (TOWEL DISPOSABLE) ×6 IMPLANT
TRAP SPECIMEN MUCOUS 40CC (MISCELLANEOUS) ×3 IMPLANT
TRAY FOLEY CATH 14FRSI W/METER (CATHETERS) ×6 IMPLANT
TUBE ANAEROBIC SPECIMEN COL (MISCELLANEOUS) IMPLANT
TUBE CONNECTING 12X1/4 (SUCTIONS) ×6 IMPLANT
TUNNELER SHEATH ON-Q 11GX8 (MISCELLANEOUS) ×3 IMPLANT
WATER STERILE IRR 1000ML POUR (IV SOLUTION) ×6 IMPLANT

## 2011-05-31 NOTE — H&P (Signed)
Natalie Carlson            [place],Natalie Carlson          [place] Record #[id number] Date of Birth: 17-Jul-1929  Referring: Dr Linde Gillis, Pulmonary Assoc Primary Care: [name], MD  Chief Complaint:    No chief complaint on file.   History of Present Illness:    Patient is 78yo female who had rt posterior chest pain in early December. Pain was pleuritic in nature. When became worse she went to ER in [place]. A Ct of the chest was done that showed a 2.1x 2.2 x 3 cm lesion of the right upper Lobe of the lung. Since she notes the pain has improved. Further workup for poss Lung cancer has been done an patient is referred for evaluation  by thoracic surgery.   The patient is very active and functional, lives alone and cares for herself including yard work and cutting her grass with push mower. Denies weight loss,fatigue, cough, hemoptysis, fever or chills. 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    Current Activity/ Functional Status: Patient is independent with mobility/ambulation, transfers, ADL's, IADL's. Grade 0 MMRC Dyspnea Scale   Past Medical History  Diagnosis Date  . Thyroid disease   . Cataract of both eyes     surgically removed  . RLQ abdominal pain   . Blood in stool   . Mucus in stool   . Chronic diarrhea   . Hyperlipidemia     takes Fish Oil daily;was on Zetia but taken off by medical MD over a yr ago  . Colitis   . History of colon polyps   . Bronchitis     in the early 90's  . History of migraines   . Arthritis     hands  . Joint pain   . Back pain     arthritis  . Bruises easily     pt is on Coumadin  . Hemorrhoids   . Kidney stone     hx of around 1994-passed on its on  . Nocturia   . Anemia     in her younger years;was on iron  . Blood transfusion 1959  . Macular degeneration, dry   . Arrhythmia    Cardiolite stress study 2011 normal LV function and no ischemia. Holter monitor June 2011--normal sinus rhythm on flecainide therapy  . Atrial fibrillation     takes Diltiazem and Coumadin  . Lung mass     Past Surgical History  Procedure Date  . Cataract extraction 2010/2011  . Bladder tack 1996  . Colonoscopy 3 YRS AGO  . Thyroid surgery 1961  . Colonoscopy 03/06/2011    Procedure: COLONOSCOPY;  Surgeon: Rogene Houston, MD;  Location[facility];  Service: Endoscopy;  Laterality: N/A;  7:30 am  . Tonsillectomy     as a child  . Esophagogastroduodenoscopy   . Bone scan 04/24/11  . Pft 12/12  . Cardioversion 11/02/09    x 2    Family History  Problem Relation Age of Onset  . Stroke Mother   . Stroke Father   . Stroke Other     two siblings  . Heart disease Other     sibling  . Liver disease Paternal Grandmother   .  Anesthesia problems Sister   . Hypotension Neg Hx   . Malignant hyperthermia Neg Hx   . Pseudochol deficiency Neg Hx     History   Social History  . Marital Status: Widowed    Spouse Name: N/A    Number of Children: N/A  . Years of Education: N/A   Occupational History  . 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 Smoking status: Never Smoker   . Smokeless tobacco: Never Used  . Alcohol Use: No  . Drug Use: No  . Sexually Active: Not on file    Social History Narrative   widowRetired from textiles    History  Smoking status  . Never Smoker   Smokeless tobacco  . Never Used    History  Alcohol Use No     Allergies  Allergen Reactions  . Doxycycline Swelling    SEVERE FACIAL SWELLING AND BURNING  . Entocort Ec (Budesonide)     Patient saw her PCP, Dr. Eula Fried and he made Dr. Laural Golden aware that he had d/c patient taking Entocort due to the side effects she was experiencing. SEVERE FACIAL BURNING  . Adhesive (Tape) Other (See Comments)    Took skin off  . Chocolate Other (See  Comments)    Causes migraines  . Codeine     REACTION: nausea  . Morphine And Related Nausea Only  . Penicillins Other (See Comments)    Childhood allergy  . Uncoded Nonscreenable Allergen Other (See Comments)    rasberry   headaches    Current Facility-Administered Medications  Medication Dose Route Frequency Provider Last Rate Last Dose  . vancomycin (VANCOCIN) IVPB 1000 mg/200 mL premix  1,000 mg Intravenous 60 min Pre-Op Natalie Isaac, MD           Review of Systems:     Cardiac Review of Systems: Y or N  Chest Pain [  y  ]  Resting SOB [  n ] Exertional SOB  [ mild ]  Natalie Carlson  ]   Pedal Edema [ n  ]    Palpitations [ y ] Syncope  [n  ]   Presyncope [n   ]  General Review of Systems: [Y] = yes [  ]=no Constitional: recent weight change [  ]; anorexia [  ]; fatigue [  ]; nausea [  ]; night sweats [  ]; fever [  ]; or chills [  ];                                                                                                                                          Dental: poor dentition[n  ];   Eye : blurred vision [  ]; diplopia [   ]; vision changes [  ];  Amaurosis fugax[  ]; Resp: cough [n  ];  wheezing[n  ];  hemoptysis[  n]; shortness of breath[mild  ]; paroxysmal nocturnal dyspnea[ n ]; dyspnea on exertion[n  ]; or orthopnea[n  ];  GI:  gallstones[  ], vomiting[  ];  dysphagia[  ]; melena[  ];  hematochezia [  ]; heartburn[n  ];   Hx of  Colonoscopy[ y ]; GU: kidney stones [ n ]; hematuria[ n ];   dysuria [n  ];  nocturia[  ];  history of     obstruction [  ];             Skin: rash, swelling[n  ];, hair loss[  ];  peripheral edema[n  ];  or itching[  ]; Musculosketetal: myalgias[  ];  joint swelling[  ];  joint erythema[  ];  joint pain[  ];  back pain[  ];  Heme/Lymph: bruising[  ];  bleeding[  ];  anemia[  ];  Neuro: TIA[  ];  headaches[  ];  stroke[  ];  vertigo[  ];  seizures[  ];   paresthesias[  ];  difficulty walking[  ];  Psych:depression[  ]; anxiety[   ];  Endocrine: diabetes[  ];  thyroid dysfunction[ y ];  Immunizations: Flu Blue.Natalie Carlson  ]; Pneumococcal[ y ];  Other:  Physical Exam: BP 137/74  Pulse 84  Temp(Src) 97.5 F (36.4 C) (Oral)  Resp 18  SpO2 98%  General appearance: alert, cooperative, appears stated age and no distress Neurologic: intact Heart: irregularly irregular rhythm Lungs: clear to auscultation bilaterally Abdomen: soft, non-tender; bowel sounds normal; no masses,  no organomegaly Extremities: extremities normal, atraumatic, no cyanosis or edema, Homans sign is negative, no sign of DVT, no edema, redness or tenderness in the calves or thighs and no ulcers, gangrene or trophic changes No caoritid Bruits, no cervical adenotathy   Diagnostic Studies & Laboratory data:     Recent Radiology Findings:    *RADIOLOGY REPORT*   Clinical Data:  Initial treatment strategy for lung mass.   NUCLEAR MEDICINE PET CT INITIAL (PI) SKULL BASE TO THIGH   Technique:  18.3 mCi F-18 FDG was injected intravenously via the right antecubital fossa.  Full-ring PET imaging was performed from the skull base through the mid-thighs 02/16  minutes after injection.  CT data was obtained and used for attenuation correction and anatomic localization only.  (This was not acquired as a diagnostic CT examination.)   Fasting Blood Glucose:  109   Patient Weight:  119 pounds.   Comparison:  None.   Findings: There is uptake in the thyroid bilaterally, which appears heterogeneous, enlarged and nodular on CT images.  Posterior segment right upper lobe nodule measures 1.9 x 2.2 cm, with an S U V max of 2.9.  No additional areas of abnormal hypermetabolism in the neck, chest, abdomen or pelvis.   CT images were performed for attenuation correction. No acute findings in the neck.  No pericardial or pleural effusion.  There may be a punctate stone in the right kidney.  Atherosclerotic calcification of the arterial vasculature without  aneurysm. Calcifications associated with the uterus are likely within fibroids.   IMPRESSION:   1.  Right upper lobe nodular airspace consolidation has borderline malignant hypermetabolism. Findings are worrisome for low grade adenocarcinoma.  An infectious/inflammatory process cannot be definitively excluded.  Comparison with prior exams would be helpful, if available.  If prior exams are not available, follow-up CT chest without contrast in 4-6 weeks could be performed.  If persistent, thoracic surgery consultation is recommended.  2.  Enlarged, nodular and heterogeneous thyroid, with associated hypermetabolism.  Thyroid ultrasound is recommended, if not already performed elsewhere, as clinically indicated.   Original Report Authenticated By: Luretha Rued, M.D.      PFTS- Done at [facility] scanned in to Record FEv1 2.16 DLCO normal  Recent Lab Findings: Lab Results  Component Value Date   WBC 5.1 05/29/2011   HGB 14.7 05/29/2011   HCT 42.9 05/29/2011   PLT 228 05/29/2011   GLUCOSE 100* 05/29/2011   ALT 14 05/29/2011   AST 22 05/29/2011   NA 136 05/29/2011   K 4.3 05/29/2011   CL 102 05/29/2011   CREATININE 0.60 05/29/2011   BUN 12 05/29/2011   CO2 23 05/29/2011   INR 1.17 05/29/2011   MRI of head- no mets   Assessment / Plan:   Right Upper Lobe lung mass suggestive of low grade malignancy, without chest wall involvement. No Tissue DX Chronic Afib on Coumadin Excellent Functional status and PFTs Hypertension Thyroid Disease-history of   I have discussed with Patient and her son the findings of CT and PET that suggest but are not conclusive for Pulmonary malignancy. The option of needle bx then VATS vs proceeding with VATS and resection was discussed. Since the patient is a reasonable operative candidate and if the needle bx was positive for malignancy or inconclusive the recommend for VATS/resection would be the same the patient wishes to proceed with  resection.  The goals risks and alternatives of the planned surgical procedure Bronscopy  Right VATS & Lung resection  have been discussed with the patient in detail. The risks of the procedure including death, infection, prolonged air leak stroke, myocardial infarction, bleeding, blood transfusion have all been discussed specifically.  I have quoted Natalie Carlson a 4% of perioperative mortality and a complication rate as high as 15%. The patient's questions have been answered.Natalie Carlson is willing  to proceed with the planned procedure.    Natalie Isaac MD  Beeper [phone] Office [phone] 05/31/2011 7:13 AM

## 2011-05-31 NOTE — Progress Notes (Signed)
Patient examined and record reviewed.Hemodynamics stable,labs satisfactory.Patient is stable. Moderate air leak with cough.Continue current care. VAN TRIGT III,Persais Ethridge 05/31/2011

## 2011-05-31 NOTE — Progress Notes (Signed)
Dr. Servando Snare at bedside and state he did not want chest tube to suction at this time.

## 2011-05-31 NOTE — Anesthesia Procedure Notes (Signed)
Procedure Name: Intubation Date/Time: 05/31/2011 9:07 AM Performed by: Virgil Benedict Pre-anesthesia Checklist: Patient identified, Emergency Drugs available, Suction available, Patient being monitored and Timeout performed Patient Re-evaluated:Patient Re-evaluated prior to inductionOxygen Delivery Method: Circle System Utilized Preoxygenation: Pre-oxygenation with 100% oxygen Intubation Type: IV induction Ventilation: Mask ventilation without difficulty Laryngoscope Size: Mac and 3 Grade View: Grade I Tube type: Oral Endobronchial tube: Left and 37 Fr Number of attempts: 1 Airway Equipment and Method: stylet and fiberoptic brochoscope Placement Confirmation: ETT inserted through vocal cords under direct vision,  breath sounds checked- equal and bilateral and positive ETCO2 Secured at: 27 cm Tube secured with: Tape Dental Injury: Teeth and Oropharynx as per pre-operative assessment

## 2011-05-31 NOTE — Preoperative (Signed)
Beta Blockers   Reason not to administer Beta Blockers:Not Applicable. No home beta blockers

## 2011-05-31 NOTE — Transfer of Care (Signed)
Immediate Anesthesia Transfer of Care Note  Patient: Natalie Carlson  Procedure(s) Performed:  VIDEO BRONCHOSCOPY; VIDEO ASSISTED THORACOSCOPY (VATS)/WEDGE RESECTION  Patient Location: PACU  Anesthesia Type: General  Level of Consciousness: sedated  Airway & Oxygen Therapy: Patient Spontanous Breathing and Patient connected to nasal cannula oxygen  Post-op Assessment: Report given to PACU RN and Post -op Vital signs reviewed and stable  Post vital signs: Reviewed Filed Vitals:   05/31/11 0641  BP: 137/74  Pulse: 84  Temp: 36.4 C  Resp: 18    Complications: No apparent anesthesia complications

## 2011-05-31 NOTE — Anesthesia Preprocedure Evaluation (Addendum)
Anesthesia Evaluation  Patient identified by MRN, date of birth, ID band Patient awake    Reviewed: Allergy & Precautions, H&P , NPO status , Patient's Chart, lab work & pertinent test results, reviewed documented beta blocker date and time   Airway Mallampati: II  Neck ROM: full    Dental   Pulmonary shortness of breath,          Cardiovascular + dysrhythmias Atrial Fibrillation     Neuro/Psych  Headaches,    GI/Hepatic   Endo/Other    Renal/GU      Musculoskeletal  (+) Arthritis -,   Abdominal   Peds  Hematology   Anesthesia Other Findings   Reproductive/Obstetrics                          Anesthesia Physical Anesthesia Plan  ASA: III  Anesthesia Plan: General   Post-op Pain Management:    Induction: Intravenous  Airway Management Planned: Oral ETT and Double Lumen EBT  Additional Equipment:   Intra-op Plan:   Post-operative Plan: Extubation in OR  Informed Consent: I have reviewed the patients History and Physical, chart, labs and discussed the procedure including the risks, benefits and alternatives for the proposed anesthesia with the patient or authorized representative who has indicated his/her understanding and acceptance.     Plan Discussed with: CRNA and Surgeon  Anesthesia Plan Comments:         Anesthesia Quick Evaluation

## 2011-05-31 NOTE — Brief Op Note (Signed)
05/31/2011  1:54 PM  PATIENT:  Natalie Carlson  78 y.o. female  PRE-OPERATIVE DIAGNOSIS:  (R) LUNG MASS  POST-OPERATIVE DIAGNOSIS:  Adeno Carcinoma of the Right Upper Lobe Lung  PROCEDURE:   VIDEO BRONCHOSCOPY,VIDEO ASSISTED THORACOSCOPY (VATS)/WEDGE RESECTION, right mini thoracotomy, RUL with lymph node dissection  SURGEON:Jacalyn Biggs Maryruth Bun, MD  PHYSICIAN ASSISTANT: Lars Pinks PA-C   ANESTHESIA:   general  EBL:  Total I/O In: 3250 [I.V.:2750; IV Piggyback:500] Out: 1175 [Urine:875; Blood:300]  BLOOD ADMINISTERED:none  DRAINS: 2  Chest Tube(s) in the right chest   LOCAL MEDICATIONS USED:  XYLOCAINE 3 CC  SPECIMEN:  Source of Specimen:  Right Upper Lobe  DISPOSITION OF SPECIMEN:  PATHOLOGY. Frozen Section consistent with Adenocarcinoma.  COUNTS:  YES  DICTATION: .Dragon Dictation  PLAN OF CARE: Admit to inpatient   PATIENT DISPOSITION:  PACU - hemodynamically stable.   Delay start of Pharmacological VTE agent (>24hrs) due to surgical blood loss or risk of bleeding: YES

## 2011-05-31 NOTE — Progress Notes (Signed)
Patient state she has slight numbness in left hand.  Hand elevated and pt instructed to report any further numbness.

## 2011-06-01 ENCOUNTER — Encounter (HOSPITAL_COMMUNITY): Payer: Self-pay | Admitting: Cardiothoracic Surgery

## 2011-06-01 ENCOUNTER — Inpatient Hospital Stay (HOSPITAL_COMMUNITY): Payer: Medicare Other

## 2011-06-01 LAB — HEPATIC FUNCTION PANEL
ALT: 14 U/L (ref 0–35)
AST: 29 U/L (ref 0–37)
Albumin: 2.4 g/dL — ABNORMAL LOW (ref 3.5–5.2)
Total Bilirubin: 0.8 mg/dL (ref 0.3–1.2)

## 2011-06-01 LAB — POCT I-STAT 3, ART BLOOD GAS (G3+)
Acid-base deficit: 2 mmol/L (ref 0.0–2.0)
Bicarbonate: 23.2 mEq/L (ref 20.0–24.0)
O2 Saturation: 98 %
Patient temperature: 98.2
TCO2: 24 mmol/L (ref 0–100)
pCO2 arterial: 38.4 mmHg (ref 35.0–45.0)
pH, Arterial: 7.388 (ref 7.350–7.400)
pO2, Arterial: 99 mmHg (ref 80.0–100.0)

## 2011-06-01 LAB — BASIC METABOLIC PANEL
BUN: 9 mg/dL (ref 6–23)
CO2: 23 mEq/L (ref 19–32)
Chloride: 101 mEq/L (ref 96–112)
GFR calc non Af Amer: 89 mL/min — ABNORMAL LOW (ref >90)
Glucose, Bld: 129 mg/dL — ABNORMAL HIGH (ref 70–99)
Potassium: 4 mEq/L (ref 3.5–5.1)
Sodium: 132 mEq/L — ABNORMAL LOW (ref 135–145)

## 2011-06-01 LAB — CBC
HCT: 31.2 % — ABNORMAL LOW (ref 36.0–46.0)
Hemoglobin: 10.6 g/dL — ABNORMAL LOW (ref 12.0–15.0)
RBC: 3.53 MIL/uL — ABNORMAL LOW (ref 3.87–5.11)
WBC: 11.6 10*3/uL — ABNORMAL HIGH (ref 4.0–10.5)

## 2011-06-01 MED ORDER — INSULIN ASPART 100 UNIT/ML ~~LOC~~ SOLN
0.0000 [IU] | SUBCUTANEOUS | Status: DC
Start: 2011-06-01 — End: 2011-06-01

## 2011-06-01 NOTE — Op Note (Signed)
NAMEMarland Kitchen  Natalie Carlson, Natalie NO.:  Carlson  MEDICAL RECORD NO.:  [id number]  LOCATION:  2316                         FACILITY:  [facility]  PHYSICIAN:  Lanelle Bal, MD    DATE OF BIRTH:  1930/03/11  DATE OF PROCEDURE:  05/31/2011 DATE OF DISCHARGE:                              OPERATIVE REPORT   PREOPERATIVE DIAGNOSIS:  Right upper lobe lung mass.  POSTOPERATIVE DIAGNOSIS:  Adenocarcinoma of the right upper lobe.  PROCEDURE PERFORMED:  Bronchoscopy, right video-assisted thoracoscopy, right upper lobe lung biopsy with right upper lobectomy and lymph node dissection.  SURGEON:  Lanelle Bal, MD  FIRST ASSISTANT:  Lars Pinks, PA  BRIEF HISTORY:  The patient is a 78 year old female who had incidental finding of a right lung mass when she presented to the emergency room with right upper quadrant and right chest and right back pain. Incidental radiograph revealed a right upper lobe lung mass approximately 2 cm in size.  PET scan showed low activity with SUV of approximately 3.  There was no evidence of mediastinal activity or enlarged mediastinal nodes.  MRI of the head showed no evidence of metastatic disease.  PFTs were adequate for lobectomy.  Risks and options of diagnosis and treatment were discussed with the patient and she agreed to proceed with right video-assisted thoracoscopy, biopsy and if malignant, proceed with right upper lobectomy.  The patient signed informed consent.  DESCRIPTION OF PROCEDURE:  The patient underwent general endotracheal anesthesia without incident through the endotracheal tube.  Fiberoptic bronchoscope was passed both into the right and left tracheobronchial tree without evidence of endobronchial lesions.  The scope was removed. The patient was turned into lateral decubitus position with the right side up.  Three port incisions were made, one anterior, one posterior, and one in the midaxillary line.  The right lung was  collapsed and the 30-degree scope was introduced into the chest.  A survey of the chest was taken.  There were no pleural implants or pleural fluid evident.  In the right upper lobe posteriorly, but not involving the chest wall or the middle lobe or lower lobe was area of puckering that was consistent with the site of finding on x-ray.  A wedge resection of this area was performed using staplers.  The specimen was placed in a bag and withdrawn from the chest.  After opening this the specimen, it was obvious that we did not get the entire lesion, but the tissue was submitted for frozen section and after consultation with the pathologist it was consistent with adenocarcinoma of the lung.  At this point, we proceeded with right upper lobectomy.  The middle port site incision was expanded; and through the remaining 2 ports and the small incision, the right upper lobectomy was performed.  First encircling and dissecting free the pulmonary veins of the upper lobe and using a tipped vascular stapler, the veins were divided.  Two pulmonary artery branches to the right upper lobe were identified.  Branches to the superior segment of the right lower lobe and branches to the middle lobe were identified and preserved.  With the stapler, the two branches to the upper lobe were  divided.  Working posteriorly, the fissure was completed and exposure to the right upper lobe bronchus was obtained.  The right upper lobe bronchus was stapled and divided.  The fissure between the right upper lobe and middle lobe was not very complete, but with a series of staples the lobe was divided.  Prior to stapling the bronchus, the right lower and right middle lobe were easily inflated, but was again deflated. ProGEL was placed on the cut edge of the lung and also on the bronchial stump.  Lymph nodes in the 10R and 12R areas were also excised and submitted separately to Pathology.  An On-Q catheter was  placed subpleurally posteriorly and secured.  A single pericostal stitch was placed through a small drill hole in the lower rib.  The utility incision was then closed with interrupted 0 Vicryl on the deep tissue, running 2-0 Vicryl on the subcutaneous tissue, and 4-0 subcuticular stitch on skin edges.  The posterior port was closed in a similar fashion on the anterior port.  A #28 chest tube was placed anteriorly and through a separate stab wound in the chest a posterior tube was placed.  The lung was reinflated without difficulty.  Sponge and needle count was reported as correct at completion of procedure.  Estimated blood loss approximately 250 mL.  The patient did not require any blood bank blood products during the operative procedure.  She was extubated in the operating room and transferred to the recovery room for further postoperative care.     Lanelle Bal, MD     EG/MEDQ  D:  06/01/2011  T:  06/01/2011  Job:  [id number]  cc:   Ramond Dial, M.D. Dr. Eula Fried

## 2011-06-01 NOTE — Clinical Documentation Improvement (Signed)
Anemia Blood Loss Clarification  THIS DOCUMENT IS NOT A PERMANENT PART OF THE MEDICAL RECORD  RESPOND TO THE THIS QUERY, FOLLOW THE INSTRUCTIONS BELOW:  1. If needed, update documentation for the patient's encounter via the notes activity.  2. Access this query again and click edit on the In [employer].  3. After updating, or not, click F2 to complete all highlighted (required) fields concerning your review. Select "additional documentation in the medical record" OR "no additional documentation provided".  4. Click Sign note button.  5. The deficiency will fall out of your In Basket *Please let us know if you are not able to complete this workflow by phone or e-mail (listed below).        06/01/11  Dear Dr.Nacole Fluhr Rolley Sims  In an effort to better capture your patient's severity of illness, reflect appropriate length of stay and utilization of resources, a review of the patient medical record has revealed the following indicators.    Based on your clinical judgment, please clarify and document in a progress note and/or discharge summary the clinical condition associated with the following supporting information:   Possible Clinical Conditions?   " Expected Acute Blood Loss Anemia  " Acute Blood Loss Anemia  " Precipitous drop in Hematocrit  " Other Condition________________  " Cannot Clinically Determine    Supporting Information:  Diagnostics: H&H on 1/14:   14.7/42.9 H&H on 1/17:   10.6/31.2  IV fluids / plasma expanders:  1/16: Hetastarch in lactated electrolyte(Hextend) 6% infusion.   Reviewed: additional documentation in the medical record  Thank You,  Theron Arista,  Clinical Documentation Specialist:  Pager: [place]    done

## 2011-06-01 NOTE — Progress Notes (Signed)
UR Completed.  Vergie Living [phone] 06/01/2011

## 2011-06-01 NOTE — Progress Notes (Signed)
Pt complaining of left arm pain  Describes pain as "aching".  L radial pulse dopplerable, capillary refill in left hand < 3 seconds.  Pt reports good sensation in L extremity.  Pt reports pain improved with elevation of limb.  Will continue to monitor.

## 2011-06-01 NOTE — Anesthesia Postprocedure Evaluation (Signed)
Anesthesia Post Note  Patient: Natalie Carlson  Procedure(s) Performed:  VIDEO BRONCHOSCOPY; VIDEO ASSISTED THORACOSCOPY (VATS)/WEDGE RESECTION  Anesthesia type: General  Patient location: PACU  Post pain: Pain level controlled and Adequate analgesia  Post assessment: Post-op Vital signs reviewed, Patient's Cardiovascular Status Stable, Respiratory Function Stable, Patent Airway and Pain level controlled  Last Vitals:  Filed Vitals:   06/01/11 0600  BP: 100/52  Pulse: 79  Temp:   Resp: 18    Post vital signs: Reviewed and stable  Level of consciousness: awake, alert  and oriented  Complications: No apparent anesthesia complications

## 2011-06-01 NOTE — Progress Notes (Addendum)
Patient ID: Natalie Carlson, female   DOB: Feb 01, 1930, 78 y.o.   MRN: [id number] TCTS DAILY PROGRESS NOTE                   [address]            [place] [address]          [phone]      1 Day Post-Op Procedure(s) (LRB): VIDEO BRONCHOSCOPY (N/A) VIDEO ASSISTED THORACOSCOPY (VATS)/Rt Upper lobectomy Total Length of Stay:  LOS: 1 day   Subjective: Feels well alert ambulated in hall  Objective: Vital signs in last 24 hours: Patient Vitals for the past 24 hrs:  BP Temp Temp src Pulse Resp SpO2 Height Weight  06/01/11 0900 106/58 mmHg - - 86  15  100 % - -  06/01/11 0800 95/52 mmHg - - 78  14  98 % - -  06/01/11 0730 - 98.3 F (36.8 C) Axillary - - - - -  06/01/11 0700 101/52 mmHg - - 76  14  98 % - -  06/01/11 0600 100/52 mmHg - - 79  18  98 % - -  06/01/11 0500 93/54 mmHg - - 68  10  98 % - 123 lb 3.8 oz (55.9 kg)  06/01/11 0424 - 98.1 F (36.7 C) Oral - - - - -  06/01/11 0400 95/51 mmHg - - 72  17  100 % - -  06/01/11 0300 94/52 mmHg - - 83  24  98 % - -  06/01/11 0200 86/54 mmHg - - 81  20  98 % - -  06/01/11 0100 78/45 mmHg - - 83  18  97 % - -  06/01/11 0000 71/50 mmHg - - 89  15  97 % - -  05/31/11 2318 - 98.2 F (36.8 C) Axillary - - - - -  05/31/11 2300 76/46 mmHg - - 85  17  97 % - -  05/31/11 2200 83/34 mmHg - - 93  11  96 % - -  05/31/11 2100 114/60 mmHg - - 93  11  96 % - -  05/31/11 2000 93/48 mmHg - - 84  18  97 % - -  05/31/11 1938 - 98.3 F (36.8 C) Axillary - - - - -  05/31/11 1900 103/45 mmHg - - 86  15  97 % - -  05/31/11 1845 - - - 96  18  98 % - -  05/31/11 1830 - - - 97  16  98 % - -  05/31/11 1815 - - - 97  18  99 % - -  05/31/11 1800 118/66 mmHg - - 93  12  98 % - -  05/31/11 1745 - - - 90  14  100 % - -  05/31/11 1730 - - - 98  15  100 % 5' 3"  (1.6 m) 118 lb (53.524 kg)  05/31/11 1715 117/57 mmHg 97.5 F (36.4 C) Oral 84  10  96 % - -  05/31/11 1645 121/71 mmHg 97.5 F (36.4 C) - 96  22  100 % - -  05/31/11 1439 - - - 84   18  94 % - -  05/31/11 1438 - - - 83  17  95 % - -  05/31/11 1437 - - - 85  19  94 % - -  05/31/11 1436 - - - 82  21  93 % - -  05/31/11 1435 - - -  82  18  95 % - -  05/31/11 1434 - - - 85  17  95 % - -  05/31/11 1433 - - - 86  17  94 % - -  05/31/11 1432 - - - 85  19  94 % - -  05/31/11 1431 - - - 85  20  96 % - -  05/31/11 1430 - - - 83  21  94 % - -  05/31/11 1429 - - - 82  18  94 % - -  05/31/11 1415 114/62 mmHg 97.5 F (36.4 C) - 85  16  96 % - -   Wt Readings from Last 3 Encounters:  06/01/11 123 lb 3.8 oz (55.9 kg)  06/01/11 123 lb 3.8 oz (55.9 kg)  05/29/11 118 lb (53.524 kg)   Weight change:    Intake/Output from previous day: 01/16 0701 - 01/17 0700 In: 5918 [I.V.:4918; IV Piggyback:1000] Out: 2285 [Urine:1720; Blood:300; Chest Tube:265]  Intake/Output this shift: Total I/O In: 125 [I.V.:125] Out: 15 [Urine:15]  Current Meds: Scheduled Meds:   . acetaminophen  1,000 mg Intravenous Q6H  . bupivacaine 0.5 % ON-Q pump SINGLE CATH 300 mL  300 mL Other To OR  . diltiazem  120 mg Oral Daily  . fentaNYL   Intravenous Q4H  . fentaNYL   Intravenous To PACU  . psyllium  1 packet Oral Daily  . vancomycin  1,000 mg Intravenous Q12H  . DISCONTD: psyllium  0.52 g Oral Daily   Continuous Infusions:   . sodium chloride 125 mL/hr at 06/01/11 0800  . bupivacaine ON-Q pain pump     PRN Meds:.diphenhydrAMINE, diphenhydrAMINE, naloxone, ondansetron (ZOFRAN) IV, potassium chloride, senna-docusate, sodium chloride, traMADol, DISCONTD: 0.9 % irrigation (POUR BTL), DISCONTD: hemostatic agents, DISCONTD: HYDROmorphone, DISCONTD: ondansetron (ZOFRAN) IV, DISCONTD: ondansetron (ZOFRAN) IV  General appearance: alert, cooperative and no distress Heart: irregularly irregular rhythm Lungs: clear to auscultation bilaterally small airleak  Lab Results: CBC: Basename 06/01/11 0430 05/29/11 1423  WBC 11.6* 5.1  HGB 10.6* 14.7  HCT 31.2* 42.9  PLT 197 228   BMET:  Basename  06/01/11 0430 05/29/11 1423  NA 132* 136  K 4.0 4.3  CL 101 102  CO2 23 23  GLUCOSE 129* 100*  BUN 9 12  CREATININE 0.51 0.60  CALCIUM 7.7* 9.4    PT/INR:  Basename 05/29/11 1423  LABPROT 15.1  INR 1.17   Radiology: Dg Chest Port 1 View  06/01/2011  *RADIOLOGY REPORT*  Clinical Data: Postop day #1 (VATS)  PORTABLE CHEST - 1 VIEW  Comparison: 05/31/2011; 05/29/2011; PET CT - 05/04/2011  Findings: Grossly unchanged cardiac silhouette and mediastinal contours with mild tortuosity of the thoracic aorta.  Postsurgical changes of the right hilum. Possible retraction of one of the right- sided chest tube with side port projected adjacent to the right lateral chest wall. Minimal increase in small right apical pneumothorax and amount of right lateral chest wall subcutaneous emphysema.  Grossly unchanged bibasilar heterogeneous opacities favored to represent atelectasis.  No definite pleural effusion. Grossly unchanged bones.  IMPRESSION: 1.  Possible retraction of one of the right-sided chest tubes with side port now adjacent to o the right lateral chest wall, though differences may be secondary to obliquity.  2.  Minimal increase in size of small right-sided pneumothorax and amount of right lateral chest wall subcutaneous emphysema.  These results will be called to the ordering clinician or representative by the Radiologist Assistant, and communication documented in the PACS  Dashboard.  Original Report Authenticated By: Rachel Moulds, M.D.   Dg Chest Portable 1 View  05/31/2011  *RADIOLOGY REPORT*  Clinical Data: Post VATS  PORTABLE CHEST - 1 VIEW  Comparison: 05/29/2011  Findings: Right upper lobectomy.  Two chest tubes on the right. 15% right apical pneumothorax.  No pleural effusion.  Mild left lower lobe atelectasis.  Right jugular catheter tip in the superior vena cava.  IMPRESSION: Postop right upper lobectomy.  15% right apical pneumothorax.  Original Report Authenticated By: Truett Perna, M.D.      Assessment/Plan: S/P Procedure(s) (LRB): VIDEO BRONCHOSCOPY (N/A) VIDEO ASSISTED THORACOSCOPY (VATS)/Rt upper lobectomy Mobilize Continue foley due to strict I&O See progression orders Small air leak Expected Blood lose anemia-acute no blood given will try to avoid transfusion   Grace Isaac MD  Beeper [phone] Office [phone] 06/01/2011 9:23 AM

## 2011-06-01 NOTE — Progress Notes (Signed)
Patient ID: Natalie Carlson, female   DOB: 1929-08-08, 78 y.o.   MRN: [id number] BP 119/63  Pulse 96  Temp(Src) 98 F (36.7 C) (Oral)  Resp 20  Ht 5' 3"  (1.6 m)  Wt 123 lb 3.8 oz (55.9 kg)  BMI 21.83 kg/m2  SpO2 97% Stable day  Walked in unit No air leak now Lab Results  Component Value Date   WBC 11.6* 06/01/2011   HGB 10.6* 06/01/2011   HCT 31.2* 06/01/2011   PLT 197 06/01/2011   GLUCOSE 129* 06/01/2011   ALT 14 06/01/2011   AST 29 06/01/2011   NA 132* 06/01/2011   K 4.0 06/01/2011   CL 101 06/01/2011   CREATININE 0.51 06/01/2011   BUN 9 06/01/2011   CO2 23 06/01/2011   INR 1.17 05/29/2011   Grace Isaac MD  Beeper [phone] Office [phone]

## 2011-06-02 ENCOUNTER — Inpatient Hospital Stay (HOSPITAL_COMMUNITY): Payer: Medicare Other

## 2011-06-02 LAB — CBC
MCH: 30.5 pg (ref 26.0–34.0)
MCV: 88.9 fL (ref 78.0–100.0)
Platelets: 206 10*3/uL (ref 150–400)
RBC: 3.77 MIL/uL — ABNORMAL LOW (ref 3.87–5.11)
RDW: 13.7 % (ref 11.5–15.5)
WBC: 12 10*3/uL — ABNORMAL HIGH (ref 4.0–10.5)

## 2011-06-02 LAB — COMPREHENSIVE METABOLIC PANEL
ALT: 17 U/L (ref 0–35)
AST: 28 U/L (ref 0–37)
Albumin: 2.5 g/dL — ABNORMAL LOW (ref 3.5–5.2)
Calcium: 8.1 mg/dL — ABNORMAL LOW (ref 8.4–10.5)
Chloride: 105 mEq/L (ref 96–112)
Creatinine, Ser: 0.54 mg/dL (ref 0.50–1.10)
Sodium: 137 mEq/L (ref 135–145)

## 2011-06-02 NOTE — Progress Notes (Signed)
Patient ID: Natalie Carlson, female   DOB: 1929-09-19, 78 y.o.   MRN: [id number] TCTS DAILY PROGRESS NOTE                   [address]            [place] [address]          [phone]      2 Days Post-Op Procedure(s) (LRB): VIDEO BRONCHOSCOPY (N/A) VIDEO ASSISTED THORACOSCOPY (VATS)/WEDGE RESECTION (Right)  Total Length of Stay:  LOS: 2 days   Subjective: Rt chest more sore then yesterday  Objective: Vital signs in last 24 hours: Patient Vitals for the past 24 hrs:  BP Temp Temp src Pulse Resp SpO2 Weight  06/02/11 0700 115/61 mmHg - - 84  13  96 % -  06/02/11 0600 131/67 mmHg - - 100  15  95 % 121 lb 11.1 oz (55.2 kg)  06/02/11 0500 131/74 mmHg - - 101  19  97 % -  06/02/11 0400 123/107 mmHg - - 91  14  100 % -  06/02/11 0352 - 97.8 F (36.6 C) Oral - - - -  06/02/11 0300 124/64 mmHg - - 80  16  95 % -  06/02/11 0200 118/69 mmHg - - 87  15  99 % -  06/02/11 0100 104/62 mmHg - - 85  11  99 % -  06/02/11 0005 - 98.2 F (36.8 C) Oral - - - -  06/02/11 0000 88/45 mmHg - - 90  14  93 % -  06/01/11 2300 91/49 mmHg - - 89  13  92 % -  06/01/11 2200 - - - 94  18  99 % -  06/01/11 2100 141/71 mmHg - - 91  17  98 % -  06/01/11 2000 113/62 mmHg - - 80  17  96 % -  06/01/11 1902 - 98.7 F (37.1 C) Oral - - - -  06/01/11 1900 112/56 mmHg - - 87  15  98 % -  06/01/11 1800 120/61 mmHg - - 82  29  96 % -  06/01/11 1700 119/63 mmHg - - 96  20  97 % -  06/01/11 1600 96/44 mmHg - - 76  20  96 % -  06/01/11 1528 - 98 F (36.7 C) Oral - - - -  06/01/11 1500 103/46 mmHg - - 69  9  97 % -  06/01/11 1400 83/39 mmHg - - 73  16  99 % -  06/01/11 1300 91/52 mmHg - - 76  15  100 % -  06/01/11 1233 - - - - 15  99 % -  06/01/11 1206 - 99.1 F (37.3 C) Axillary - - - -  06/01/11 1200 105/57 mmHg - - 81  17  99 % -  06/01/11 1110 97/56 mmHg - - 76  19  100 % -  06/01/11 1100 77/48 mmHg - - 82  19  100 % -  06/01/11 1000 95/41 mmHg - - 80  19  100 % -  06/01/11 0900 106/58  mmHg - - 86  15  100 % -  06/01/11 0800 95/52 mmHg - - 78  14  98 % -   Wt Readings from Last 3 Encounters:  06/02/11 121 lb 11.1 oz (55.2 kg)  06/02/11 121 lb 11.1 oz (55.2 kg)  05/29/11 118 lb (53.524 kg)   Weight change: 3 lb 11.1 oz (1.675 kg)  Hemodynamic parameters for last 24 hours:    Intake/Output from previous day: 01/17 0701 - 01/18 0700 In: 2167 [P.O.:750; I.V.:1417] Out: 2750 [Urine:2550; Chest Tube:200]  Intake/Output this shift:    Current Meds: Scheduled Meds:   . acetaminophen  1,000 mg Intravenous Q6H  . bupivacaine 0.5 % ON-Q pump SINGLE CATH 300 mL  300 mL Other To OR  . diltiazem  120 mg Oral Daily  . fentaNYL   Intravenous Q4H  . psyllium  1 packet Oral Daily  . DISCONTD: insulin aspart  0-24 Units Subcutaneous Q4H   Continuous Infusions:   . sodium chloride 50 mL/hr at 06/02/11 0700  . bupivacaine ON-Q pain pump     PRN Meds:.diphenhydrAMINE, diphenhydrAMINE, naloxone, ondansetron (ZOFRAN) IV, potassium chloride, senna-docusate, sodium chloride, traMADol  General appearance: alert and no distress Heart: regularly irregular rhythm Lungs: clear to auscultation bilaterally very small air leak with cough  Lab Results: CBC: Basename 06/02/11 0400 06/01/11 0430  WBC 12.0* 11.6*  HGB 11.5* 10.6*  HCT 33.5* 31.2*  PLT 206 197   BMET:  Basename 06/02/11 0400 06/01/11 0430  NA 137 132*  K 3.8 4.0  CL 105 101  CO2 26 23  GLUCOSE 107* 129*  BUN 8 9  CREATININE 0.54 0.51  CALCIUM 8.1* 7.7*    PT/INR: No results found for this basename: LABPROT,INR in the last 72 hours Radiology: Dg Chest Port 1 View  06/01/2011  *RADIOLOGY REPORT*  Clinical Data: Postop day #1 (VATS)  PORTABLE CHEST - 1 VIEW  Comparison: 05/31/2011; 05/29/2011; PET CT - 05/04/2011  Findings: Grossly unchanged cardiac silhouette and mediastinal contours with mild tortuosity of the thoracic aorta.  Postsurgical changes of the right hilum. Possible retraction of one of the  right- sided chest tube with side port projected adjacent to the right lateral chest wall. Minimal increase in small right apical pneumothorax and amount of right lateral chest wall subcutaneous emphysema.  Grossly unchanged bibasilar heterogeneous opacities favored to represent atelectasis.  No definite pleural effusion. Grossly unchanged bones.  IMPRESSION: 1.  Possible retraction of one of the right-sided chest tubes with side port now adjacent to o the right lateral chest wall, though differences may be secondary to obliquity.  2.  Minimal increase in size of small right-sided pneumothorax and amount of right lateral chest wall subcutaneous emphysema.  These results will be called to the ordering clinician or representative by the Radiologist Assistant, and communication documented in the PACS Dashboard.  Original Report Authenticated By: Rachel Moulds, M.D.   Dg Chest Portable 1 View  05/31/2011  *RADIOLOGY REPORT*  Clinical Data: Post VATS  PORTABLE CHEST - 1 VIEW  Comparison: 05/29/2011  Findings: Right upper lobectomy.  Two chest tubes on the right. 15% right apical pneumothorax.  No pleural effusion.  Mild left lower lobe atelectasis.  Right jugular catheter tip in the superior vena cava.  IMPRESSION: Postop right upper lobectomy.  15% right apical pneumothorax.  Original Report Authenticated By: Truett Perna, M.D.   Film 1-18 decrease in rt ptx, chest tube position ok  Assessment/Plan: S/P Procedure(s) (LRB): VIDEO BRONCHOSCOPY (N/A) VIDEO ASSISTED THORACOSCOPY (VATS)/WEDGE RESECTION (Right) Mobilize See progression orders Chest to water seal HGB stable Still in afib chronic Resume coumadin tomorrow Path reviewed and discussed with patient  [id number]   Grace Isaac MD  Beeper [phone] Office [phone] 06/02/2011 7:47 AM

## 2011-06-02 NOTE — Progress Notes (Signed)
Patient examined and record reviewed.Hemodynamics stable,labs satisfactory.Patient had stable day.Continue current care. VAN TRIGT III,PETER 06/02/2011

## 2011-06-02 NOTE — Progress Notes (Signed)
CSW spoke with RN and with pt. Pt progressing quickly and in wonderful spirits, states she has much support from family, friends, and church community. Pt and RN w/o concerns about plan for d/c home, possibly with home health RN, if needed. CSW not signing on at this time and will refer to [facility] to follow up with home health needs.   Jerral Ralph, MSW [phone]

## 2011-06-03 ENCOUNTER — Inpatient Hospital Stay (HOSPITAL_COMMUNITY): Payer: Medicare Other

## 2011-06-03 LAB — BASIC METABOLIC PANEL
BUN: 4 mg/dL — ABNORMAL LOW (ref 6–23)
CO2: 28 mEq/L (ref 19–32)
Calcium: 8.2 mg/dL — ABNORMAL LOW (ref 8.4–10.5)
Chloride: 99 mEq/L (ref 96–112)
Creatinine, Ser: 0.45 mg/dL — ABNORMAL LOW (ref 0.50–1.10)
GFR calc Af Amer: >90 mL/min (ref >90)
GFR calc non Af Amer: >90 mL/min (ref >90)
Glucose, Bld: 123 mg/dL — ABNORMAL HIGH (ref 70–99)
Potassium: 3 mEq/L — ABNORMAL LOW (ref 3.5–5.1)
Sodium: 135 mEq/L (ref 135–145)

## 2011-06-03 LAB — CBC
HCT: 34.5 % — ABNORMAL LOW (ref 36.0–46.0)
Hemoglobin: 11.8 g/dL — ABNORMAL LOW (ref 12.0–15.0)
MCH: 30.2 pg (ref 26.0–34.0)
MCHC: 34.2 g/dL (ref 30.0–36.0)
MCV: 88.2 fL (ref 78.0–100.0)
Platelets: 212 10*3/uL (ref 150–400)
RBC: 3.91 MIL/uL (ref 3.87–5.11)
RDW: 13.4 % (ref 11.5–15.5)
WBC: 12.2 10*3/uL — ABNORMAL HIGH (ref 4.0–10.5)

## 2011-06-03 LAB — PROTIME-INR
INR: 1.18 (ref 0.00–1.49)
Prothrombin Time: 15.2 seconds (ref 11.6–15.2)

## 2011-06-03 MED ORDER — WARFARIN SODIUM 2.5 MG PO TABS
2.5000 mg | ORAL_TABLET | Freq: Once | ORAL | Status: AC
  Administered 2011-06-03: 2.5 mg via ORAL
  Filled 2011-06-03: qty 1

## 2011-06-03 NOTE — Progress Notes (Signed)
Patient ID: Natalie Carlson, female   DOB: 02-29-1930, 78 y.o.   MRN: [id number] TCTS DAILY PROGRESS NOTE                   [address]            [place], [address]          [phone]      3 Days Post-Op Procedure(s) (LRB): VIDEO BRONCHOSCOPY (N/A) VIDEO ASSISTED THORACOSCOPY (VATS)/WEDGE RESECTION (Right)  Total Length of Stay:  LOS: 3 days   Subjective: Better pain control; then yesterday  Objective: Vital signs in last 24 hours: Temp:  [97.3 F (36.3 C)-98.4 F (36.9 C)] 98.1 F (36.7 C) (01/19 1114) Pulse Rate:  [75-117] 84  (01/19 1100) Cardiac Rhythm:  [-] Atrial fibrillation (01/19 0800) Resp:  [8-26] 13  (01/19 1100) BP: (79-148)/(26-88) 114/69 mmHg (01/19 1100) SpO2:  [89 %-99 %] 96 % (01/19 1100) Weight:  [116 lb 9.6 oz (52.889 kg)] 116 lb 9.6 oz (52.889 kg) (01/19 0600)  Wt Readings from Last 3 Encounters:  06/03/11 116 lb 9.6 oz (52.889 kg)  06/03/11 116 lb 9.6 oz (52.889 kg)  05/29/11 118 lb (53.524 kg)    Weight change: -5 lb 1.5 oz (-2.311 kg)   Hemodynamic parameters for last 24 hours:    Intake/Output from previous day: 01/18 0701 - 01/19 0700 In: 8786 [P.O.:1040; I.V.:503; IV Piggyback:106] Out: 3495 [Urine:3265; Chest Tube:230]  Intake/Output this shift: Total I/O In: 360 [P.O.:250; I.V.:60; IV Piggyback:50] Out: 300 [Urine:250; Chest Tube:50]  Current Meds: Scheduled Meds:   . diltiazem  120 mg Oral Daily  . fentaNYL   Intravenous Q4H  . psyllium  1 packet Oral Daily   Continuous Infusions:   . sodium chloride 20 mL/hr at 06/03/11 0600  . bupivacaine ON-Q pain pump     PRN Meds:.diphenhydrAMINE, diphenhydrAMINE, naloxone, ondansetron (ZOFRAN) IV, potassium chloride, senna-docusate, sodium chloride, traMADol  General appearance: alert, cooperative and no distress Neurologic: intact Heart: irregularly irregular rhythm Lungs: diminished breath sounds RUL  Lab Results: CBC: Basename 06/03/11 0400 06/02/11 0400    WBC 12.2* 12.0*  HGB 11.8* 11.5*  HCT 34.5* 33.5*  PLT 212 206   BMET:  Basename 06/03/11 0400 06/02/11 0400  NA 135 137  K 3.0* 3.8  CL 99 105  CO2 28 26  GLUCOSE 123* 107*  BUN 4* 8  CREATININE 0.45* 0.54  CALCIUM 8.2* 8.1*    PT/INR:  Basename 06/03/11 0400  LABPROT 15.2  INR 1.18   Radiology: Dg Chest Portable 1 View In Am  06/02/2011  *RADIOLOGY REPORT*  Clinical Data: Postop open heart surgery, follow-up  PORTABLE CHEST - 1 VIEW  Comparison: Portable chest x-ray of 06/01/2011  Findings: Two right chest tubes are present and no residual right apical pneumothorax is seen.  A small amount of right chest wall subcutaneous air remains.  A right IJ central venous line is present.  Mild atelectasis is noted at the left lung base. Cardiomegaly is stable.  IMPRESSION: Two right chest tubes remain.  No definite right pneumothorax.  Original Report Authenticated By: Joretta Bachelor, M.D.   Dg Chest Port 1v Same Day  06/03/2011  *RADIOLOGY REPORT*  Clinical Data: Postop.  PORTABLE CHEST - 1 VIEW SAME DAY  Comparison: 06/02/2011  Findings: Right chest tubes and right central line remain in place, unchanged.  Enlarging right pneumothorax, now approximately 15%. Subcutaneous air again noted.  Underlying COPD.  Heart is normal size.  Left basilar atelectasis, stable.  IMPRESSION: Enlarging right pneumothorax with right chest tubes in place. Right pneumothorax now approximately 15%.  Original Report Authenticated By: Raelyn Number, M.D.     Assessment/Plan: S/P Procedure(s) (LRB): VIDEO BRONCHOSCOPY (N/A) VIDEO ASSISTED THORACOSCOPY (VATS)/WEDGE RESECTION (Right) Mobilize d/c tubes/lines K replaced Increased apical ptx  No air leak from anterior tube will remove and leave one ct    Grace Isaac MD  Beeper [phone] Office [phone] 06/03/2011 11:25 AM

## 2011-06-04 ENCOUNTER — Inpatient Hospital Stay (HOSPITAL_COMMUNITY): Payer: Medicare Other

## 2011-06-04 LAB — CBC
HCT: 33.2 % — ABNORMAL LOW (ref 36.0–46.0)
Hemoglobin: 11.2 g/dL — ABNORMAL LOW (ref 12.0–15.0)
MCH: 30.2 pg (ref 26.0–34.0)
MCHC: 33.7 g/dL (ref 30.0–36.0)
MCV: 89.5 fL (ref 78.0–100.0)
Platelets: 221 10*3/uL (ref 150–400)
RBC: 3.71 MIL/uL — ABNORMAL LOW (ref 3.87–5.11)
RDW: 13.7 % (ref 11.5–15.5)
WBC: 9.4 10*3/uL (ref 4.0–10.5)

## 2011-06-04 LAB — BASIC METABOLIC PANEL
BUN: 9 mg/dL (ref 6–23)
CO2: 29 mEq/L (ref 19–32)
Calcium: 8.2 mg/dL — ABNORMAL LOW (ref 8.4–10.5)
Chloride: 102 mEq/L (ref 96–112)
Creatinine, Ser: 0.57 mg/dL (ref 0.50–1.10)
GFR calc Af Amer: >90 mL/min (ref >90)
GFR calc non Af Amer: 86 mL/min — ABNORMAL LOW (ref >90)
Glucose, Bld: 107 mg/dL — ABNORMAL HIGH (ref 70–99)
Potassium: 3.4 mEq/L — ABNORMAL LOW (ref 3.5–5.1)
Sodium: 137 mEq/L (ref 135–145)

## 2011-06-04 LAB — PROTIME-INR
INR: 1.16 (ref 0.00–1.49)
Prothrombin Time: 15 seconds (ref 11.6–15.2)

## 2011-06-04 MED ORDER — POTASSIUM CHLORIDE 10 MEQ/50ML IV SOLN
10.0000 meq | INTRAVENOUS | Status: AC
  Administered 2011-06-04 (×3): 10 meq via INTRAVENOUS

## 2011-06-04 MED ORDER — WARFARIN SODIUM 7.5 MG PO TABS
7.5000 mg | ORAL_TABLET | Freq: Once | ORAL | Status: AC
  Administered 2011-06-04: 7.5 mg via ORAL
  Filled 2011-06-04: qty 1

## 2011-06-04 MED ORDER — ACETAMINOPHEN 325 MG PO TABS
650.0000 mg | ORAL_TABLET | ORAL | Status: DC | PRN
  Administered 2011-06-04 – 2011-06-08 (×6): 650 mg via ORAL
  Filled 2011-06-04 (×6): qty 2

## 2011-06-04 NOTE — Progress Notes (Signed)
27m fentanyl wasted in sink- witnessed by KJuanito DoomRN

## 2011-06-04 NOTE — Progress Notes (Signed)
Patient ID: Natalie Carlson, female   DOB: 1930-02-08, 78 y.o.   MRN: [id number] TCTS DAILY PROGRESS NOTE                   [address]            [place] [address]          [phone]      4 Days Post-Op Procedure(s) (LRB): VIDEO BRONCHOSCOPY (N/A) VIDEO ASSISTED THORACOSCOPY (VATS)/WEDGE RESECTION (Right)  Total Length of Stay:  LOS: 4 days   Subjective: Feels better, pain better controlled ambulating  Objective: Vital signs in last 24 hours: Temp:  [97.9 F (36.6 C)-98.4 F (36.9 C)] 97.9 F (36.6 C) (01/20 0713) Pulse Rate:  [79-116] 97  (01/20 1000) Cardiac Rhythm:  [-] Atrial fibrillation (01/20 0800) Resp:  [13-24] 14  (01/20 1000) BP: (87-135)/(37-82) 98/69 mmHg (01/20 1000) SpO2:  [90 %-99 %] 97 % (01/20 1000) Weight:  [116 lb 6.5 oz (52.8 kg)] 116 lb 6.5 oz (52.8 kg) (01/20 0600)  Remains in a fib  Wt Readings from Last 3 Encounters:  06/04/11 116 lb 6.5 oz (52.8 kg)  06/04/11 116 lb 6.5 oz (52.8 kg)  05/29/11 118 lb (53.524 kg)    Weight change: -3.2 oz (-0.089 kg)   Hemodynamic parameters for last 24 hours:    Intake/Output from previous day: 01/19 0701 - 01/20 0700 In: 1028 [P.O.:490; I.V.:488; IV Piggyback:50] Out: 1600 [Urine:1480; Chest Tube:120]  Intake/Output this shift: Total I/O In: 170 [I.V.:20; IV Piggyback:150] Out: 50 [Chest Tube:50]  Current Meds: Scheduled Meds:   . diltiazem  120 mg Oral Daily  . fentaNYL   Intravenous Q4H  . potassium chloride  10 mEq Intravenous Q1 Hr x 3  . psyllium  1 packet Oral Daily  . warfarin  2.5 mg Oral ONCE-1800   Continuous Infusions:   . sodium chloride Stopped (06/04/11 0707)  . DISCONTD: bupivacaine ON-Q pain pump     PRN Meds:.diphenhydrAMINE, diphenhydrAMINE, naloxone, ondansetron (ZOFRAN) IV, potassium chloride, senna-docusate, sodium chloride, traMADol  General appearance: alert, cooperative and no distress Neurologic: intact Heart: irregularly irregular  rhythm Lungs: clear to auscultation bilaterally Abdomen: soft, non-tender; bowel sounds normal; no masses,  no organomegaly Extremities: extremities normal, atraumatic, no cyanosis or edema Very small air leak with cough, leave tube one more day  Lab Results: CBC: Basename 06/04/11 0419 06/03/11 0400  WBC 9.4 12.2*  HGB 11.2* 11.8*  HCT 33.2* 34.5*  PLT 221 212   BMET:  Basename 06/04/11 0419 06/03/11 0400  NA 137 135  K 3.4* 3.0*  CL 102 99  CO2 29 28  GLUCOSE 107* 123*  BUN 9 4*  CREATININE 0.57 0.45*  CALCIUM 8.2* 8.2*    PT/INR:  Basename 06/04/11 0419  LABPROT 15.0  INR 1.16   Radiology: Dg Chest Port 1 View  06/04/2011  *RADIOLOGY REPORT*  Clinical Data: Postop.  PORTABLE CHEST - 1 VIEW  Comparison: 06/03/2011  Findings: Interval removal of one of the two right chest tubes. Right pneumothorax is decreased in size since prior study, now approximately 5-10%.  Subcutaneous air is stable.  Hyperinflation of the lungs.  Left base atelectasis, slightly improved.  Heart is upper limits normal in size.  IMPRESSION: Interval removal of one of the two right chest tubes.  Decreasing size of the right pneumothorax.  Decreasing left base atelectasis.  COPD.  Original Report Authenticated By: Raelyn Number, M.D.   Dg Chest Port 1v Same  Day  06/03/2011  *RADIOLOGY REPORT*  Clinical Data: Postop.  PORTABLE CHEST - 1 VIEW SAME DAY  Comparison: 06/02/2011  Findings: Right chest tubes and right central line remain in place, unchanged.  Enlarging right pneumothorax, now approximately 15%. Subcutaneous air again noted.  Underlying COPD.  Heart is normal size.  Left basilar atelectasis, stable.  IMPRESSION: Enlarging right pneumothorax with right chest tubes in place. Right pneumothorax now approximately 15%.  Original Report Authenticated By: Raelyn Number, M.D.     Assessment/Plan: S/P Procedure(s) (LRB): VIDEO BRONCHOSCOPY (N/A) VIDEO ASSISTED THORACOSCOPY (VATS)/WEDGE RESECTION  (Right) Mobilize Plan for transfer to step-down: see transfer orders Continue coumadin    Grace Isaac MD  Beeper [phone] Office [phone] 06/04/2011 11:02 AM

## 2011-06-05 ENCOUNTER — Inpatient Hospital Stay (HOSPITAL_COMMUNITY): Payer: Medicare Other

## 2011-06-05 LAB — BASIC METABOLIC PANEL
BUN: 12 mg/dL (ref 6–23)
CO2: 27 mEq/L (ref 19–32)
Calcium: 8.7 mg/dL (ref 8.4–10.5)
Chloride: 102 mEq/L (ref 96–112)
Creatinine, Ser: 0.54 mg/dL (ref 0.50–1.10)
GFR calc Af Amer: >90 mL/min (ref >90)
GFR calc non Af Amer: 87 mL/min — ABNORMAL LOW (ref >90)
Glucose, Bld: 127 mg/dL — ABNORMAL HIGH (ref 70–99)
Potassium: 4.2 mEq/L (ref 3.5–5.1)
Sodium: 138 mEq/L (ref 135–145)

## 2011-06-05 LAB — CBC
HCT: 38.5 % (ref 36.0–46.0)
Hemoglobin: 12.8 g/dL (ref 12.0–15.0)
MCH: 29.9 pg (ref 26.0–34.0)
MCHC: 33.2 g/dL (ref 30.0–36.0)
MCV: 90 fL (ref 78.0–100.0)
Platelets: 278 10*3/uL (ref 150–400)
RBC: 4.28 MIL/uL (ref 3.87–5.11)
RDW: 13.8 % (ref 11.5–15.5)
WBC: 10.9 10*3/uL — ABNORMAL HIGH (ref 4.0–10.5)

## 2011-06-05 LAB — PROTIME-INR
INR: 1.18 (ref 0.00–1.49)
Prothrombin Time: 15.2 seconds (ref 11.6–15.2)

## 2011-06-05 MED ORDER — WARFARIN SODIUM 7.5 MG PO TABS
7.5000 mg | ORAL_TABLET | Freq: Once | ORAL | Status: AC
  Administered 2011-06-05: 7.5 mg via ORAL
  Filled 2011-06-05: qty 1

## 2011-06-05 MED ORDER — ONDANSETRON HCL 4 MG/2ML IJ SOLN
INTRAMUSCULAR | Status: AC
  Administered 2011-06-05: 4 mg
  Filled 2011-06-05: qty 2

## 2011-06-05 NOTE — Progress Notes (Signed)
Patient ID: Natalie Carlson, female   DOB: January 12, 1930, 78 y.o.   MRN: [id number] TCTS DAILY PROGRESS NOTE                   [address]            [place] [address]          [phone]      5 Days Post-Op Procedure(s) (LRB): VIDEO BRONCHOSCOPY (N/A) VIDEO ASSISTED THORACOSCOPY (VATS)/WEDGE RESECTION (Right)  Total Length of Stay:  LOS: 5 days   Subjective: Feels well, ambulating well  Objective: Vital signs in last 24 hours: Temp:  [97.6 F (36.4 C)-98.2 F (36.8 C)] 97.6 F (36.4 C) (01/21 0815) Pulse Rate:  [84-112] 95  (01/21 0800) Cardiac Rhythm:  [-] Atrial fibrillation (01/21 1000) Resp:  [10-19] 16  (01/21 1000) BP: (95-127)/(42-76) 104/70 mmHg (01/21 1000) SpO2:  [94 %-98 %] 96 % (01/21 0800) Weight:  [117 lb 4.6 oz (53.2 kg)] 117 lb 4.6 oz (53.2 kg) (01/21 0400)  Wt Readings from Last 3 Encounters:  06/05/11 117 lb 4.6 oz (53.2 kg)  06/05/11 117 lb 4.6 oz (53.2 kg)  05/29/11 118 lb (53.524 kg)    Weight change: 14.1 oz (0.4 kg)   Hemodynamic parameters for last 24 hours:    Intake/Output from previous day: 01/20 0701 - 01/21 0700 In: 390 [P.O.:200; I.V.:40; IV Piggyback:150] Out: 1060 [Urine:880; Chest Tube:180]  Intake/Output this shift: Total I/O In: 100 [P.O.:100] Out: 100 [Urine:100]  Current Meds: Scheduled Meds:   . diltiazem  120 mg Oral Daily  . psyllium  1 packet Oral Daily  . warfarin  7.5 mg Oral ONCE-1800  . DISCONTD: fentaNYL   Intravenous Q4H   Continuous Infusions:   . sodium chloride Stopped (06/04/11 0707)   PRN Meds:.acetaminophen, potassium chloride, senna-docusate, traMADol, DISCONTD: diphenhydrAMINE, DISCONTD: diphenhydrAMINE, DISCONTD: naloxone, DISCONTD: ondansetron (ZOFRAN) IV, DISCONTD: sodium chloride  General appearance: alert, cooperative and no distress Neurologic: intact Heart: irregularly irregular rhythm Lungs: clear to auscultation bilaterally small air leak with cough  Lab  Results: CBC: Basename 06/05/11 0445 06/04/11 0419  WBC 10.9* 9.4  HGB 12.8 11.2*  HCT 38.5 33.2*  PLT 278 221   BMET:  Basename 06/05/11 0445 06/04/11 0419  NA 138 137  K 4.2 3.4*  CL 102 102  CO2 27 29  GLUCOSE 127* 107*  BUN 12 9  CREATININE 0.54 0.57  CALCIUM 8.7 8.2*    PT/INR:  Basename 06/05/11 0445  LABPROT 15.2  INR 1.18   Radiology: Dg Chest Port 1 View  06/04/2011  *RADIOLOGY REPORT*  Clinical Data: Postop.  PORTABLE CHEST - 1 VIEW  Comparison: 06/03/2011  Findings: Interval removal of one of the two right chest tubes. Right pneumothorax is decreased in size since prior study, now approximately 5-10%.  Subcutaneous air is stable.  Hyperinflation of the lungs.  Left base atelectasis, slightly improved.  Heart is upper limits normal in size.  IMPRESSION: Interval removal of one of the two right chest tubes.  Decreasing size of the right pneumothorax.  Decreasing left base atelectasis.  COPD.  Original Report Authenticated By: Raelyn Number, M.D.   Dg Chest Port 1v Same Day  06/05/2011  *RADIOLOGY REPORT*  Clinical Data: Postoperative radiograph.  VATS.  Air leak.  PORTABLE CHEST - 1 VIEW SAME DAY  Comparison: 06/04/2011.  Findings: Right thoracostomy tube is present with the tip near the apex.  The pleural line is difficult to appreciate but  probably lies just inferior to the posterior right third rib.  Emphysema is present.  Soft tissue emphysema tracks along the right chest wall. Cardiopericardial silhouette and mediastinal contours are unchanged.  Mediastinal clips present on the right. Monitoring leads are projected over the chest.  IMPRESSION:  1.  Unchanged right thoracostomy tube. 2.  Probable small right apical pneumothorax with pleural line overlying the right posterior third rib.  Original Report Authenticated By: Dereck Ligas, M.D.     Assessment/Plan: S/P Procedure(s) (LRB): VIDEO BRONCHOSCOPY (N/A) VIDEO ASSISTED THORACOSCOPY (VATS)/WEDGE RESECTION  (Right) Plan for transfer to step-down: see transfer orders Place ct back to suction    Grace Isaac MD  Beeper [phone] Office [phone] 06/05/2011 10:41 AM

## 2011-06-06 ENCOUNTER — Inpatient Hospital Stay (HOSPITAL_COMMUNITY): Payer: Medicare Other

## 2011-06-06 LAB — PROTIME-INR
INR: 1.72 — ABNORMAL HIGH (ref 0.00–1.49)
Prothrombin Time: 20.5 seconds — ABNORMAL HIGH (ref 11.6–15.2)

## 2011-06-06 MED ORDER — WARFARIN SODIUM 2.5 MG PO TABS
2.5000 mg | ORAL_TABLET | Freq: Once | ORAL | Status: AC
  Administered 2011-06-06: 2.5 mg via ORAL
  Filled 2011-06-06: qty 1

## 2011-06-06 MED ORDER — LACTULOSE 10 GM/15ML PO SOLN
10.0000 g | Freq: Once | ORAL | Status: AC
  Administered 2011-06-06: 10 g via ORAL
  Filled 2011-06-06: qty 15

## 2011-06-06 NOTE — Progress Notes (Addendum)
6 Days Post-Op Procedure(s) (LRB): VIDEO BRONCHOSCOPY (N/A) VIDEO ASSISTED THORACOSCOPY (VATS)/Rt upper lobectomy  Subjective: Patient states hard to sleep b/c chest tube is uncomfortable.  Objective: Vital signs in last 24 hours: Patient Vitals for the past 24 hrs:  BP Temp Temp src Pulse Resp SpO2  06/06/11 0530 127/71 mmHg 97.3 F (36.3 C) Oral 103  19  97 %  06/05/11 2136 110/76 mmHg 98 F (36.7 C) Oral 100  18  96 %  06/05/11 1700 144/62 mmHg 98 F (36.7 C) Oral 101  18  100 %  06/05/11 1600 - - - - 13  97 %  06/05/11 1543 - 97.7 F (36.5 C) Oral - - -  06/05/11 1500 119/45 mmHg - - - 14  -  06/05/11 1400 - - - - 13  -  06/05/11 1300 - - - - 18  -  06/05/11 1245 - 97.8 F (36.6 C) Oral - - -  06/05/11 1200 101/61 mmHg - - 84  12  99 %  06/05/11 1100 99/71 mmHg - - - 17  -  06/05/11 1000 104/70 mmHg - - - 16  -  06/05/11 0900 - - - - 19  -    Current Weight  06/05/11 117 lb 4.6 oz (53.2 kg)      Intake/Output from previous day: 01/21 0701 - 01/22 0700 In: 440 [P.O.:440] Out: 640 [Urine:600; Chest Tube:40]   Physical Exam:  Cardiovascular: Natalie Carlson; Pulmonary: Clear to auscultation bilaterally; no rales, wheezes, or rhonchi. Abdomen: Soft, non tender, bowel sounds present. Extremities: Mild bilateral lower extremity edema. Wounds: Clean and dry.  No erythema or signs of infection.  Lab Results: CBC: Basename 06/05/11 0445 06/04/11 0419  WBC 10.9* 9.4  HGB 12.8 11.2*  HCT 38.5 33.2*  PLT 278 221   BMET:  Basename 06/05/11 0445 06/04/11 0419  NA 138 137  K 4.2 3.4*  CL 102 102  CO2 27 29  GLUCOSE 127* 107*  BUN 12 9  CREATININE 0.54 0.57  CALCIUM 8.7 8.2*    PT/INR:  Basename 06/06/11 0640  LABPROT 20.5*  INR 1.72*   ABG:  INR: Will add last result for INR, ABG once components are confirmed Will add last 4 CBG results once components are confirmed  Assessment/Plan:  1.  Pulmonary - Encourage incentive spirometer. CXR this am shows  possible trace right apical pneumothorax, lateral right chest wall emphysema.Chest tube with approximately 40cc of output. No air leak. Probable place to water seal. Check CXR in am. 2.CV-Chronic afib with CVR. Continue Cardizem and Coumadin.  ZIMMERMAN,DONIELLE MPA-C 06/06/2011   no airleak today Ct back to water seal and poss remove tomorrow INR 1.7   I have seen and examined Natalie Carlson and agree with the above assessment  and plan.  Grace Isaac MD Beeper [phone] Office [phone] 06/06/2011 3:58 PM

## 2011-06-06 NOTE — Progress Notes (Signed)
Patient ambulated 545f without difficulty. HR increased to 130-140s but quickly returned to baseline once back in room. CVergie Living

## 2011-06-06 NOTE — Progress Notes (Addendum)
Pt ambulated approximately 300 ft with RN on room air.  Tolerated well, steady on her feet.  Patient exhibited no shortness of breath and took no breaks to rest.  Left resting comfortably in room with call bell within reach.  Will continue to monitor.  Natalie Carlson Patient

## 2011-06-06 NOTE — Progress Notes (Signed)
UR Completed.  Natalie Carlson 06/06/2011 [phone]

## 2011-06-07 ENCOUNTER — Inpatient Hospital Stay (HOSPITAL_COMMUNITY): Payer: Medicare Other

## 2011-06-07 LAB — URINALYSIS, ROUTINE W REFLEX MICROSCOPIC
Nitrite: POSITIVE — AB
Specific Gravity, Urine: 1.016 (ref 1.005–1.030)
Urobilinogen, UA: 1 mg/dL (ref 0.0–1.0)
pH: 6.5 (ref 5.0–8.0)

## 2011-06-07 LAB — URINE MICROSCOPIC-ADD ON

## 2011-06-07 LAB — PROTIME-INR
INR: 1.62 — ABNORMAL HIGH (ref 0.00–1.49)
Prothrombin Time: 19.5 seconds — ABNORMAL HIGH (ref 11.6–15.2)

## 2011-06-07 MED ORDER — WARFARIN SODIUM 5 MG PO TABS
5.0000 mg | ORAL_TABLET | Freq: Once | ORAL | Status: AC
  Administered 2011-06-07: 5 mg via ORAL
  Filled 2011-06-07: qty 1

## 2011-06-07 MED ORDER — CIPROFLOXACIN HCL 500 MG PO TABS
500.0000 mg | ORAL_TABLET | Freq: Two times a day (BID) | ORAL | Status: DC
  Administered 2011-06-07 – 2011-06-08 (×3): 500 mg via ORAL
  Filled 2011-06-07 (×5): qty 1

## 2011-06-07 MED ORDER — TRAMADOL HCL 50 MG PO TABS: 50.0000 mg | ORAL_TABLET | Freq: Four times a day (QID) | ORAL | Status: AC | PRN

## 2011-06-07 MED ORDER — CIPROFLOXACIN HCL 500 MG PO TABS: 500.0000 mg | ORAL_TABLET | Freq: Two times a day (BID) | ORAL | Status: AC

## 2011-06-07 NOTE — Discharge Summary (Signed)
[address]            [place] [address]          [phone]         Discharge Summary  Name: Natalie Carlson DOB: [birth year] 78 y.o. MRN: [id number]  Admission Date: 05/31/2011 Discharge Date:    Admitting Diagnosis: Right upper lobe lung lesion  Discharge Diagnosis:  Well differentiated adenocarcinoma right upper lobe (T1b, N0) Urinary tract infection Hyperlipidemia History of colon polyps Arthritis Macular degeneration Chronic atrial fibrillation, on Coumadin  Procedures: Procedure(s): Bronchoscopy, right video-assisted thoracoscopy, right upper lobectomy and lymph node dissection on 05/31/2011   HPI:  The patient is a 78 y.o. female who in December of 2012 developed right upper quadrant and posterior chest pain. She was seen in the emergency department in each and and had a chest x-ray and a CT of the chest which showed an incidental right upper lobe lung lesion measuring 2.1 x 2.2 x 3 cm. She was referred as an outpatient to Dr. Servando Snare. A PET scan showed low activity with an SUV of around 3 and no evidence of mediastinal node activity. Dr. Servando Snare recommended proceeding with a VATS for biopsy and possible lobectomy at this time. All risks, benefits and alternatives of surgery were explained in detail, and the patient agreed to proceed.    Hospital Course:  The patient was admitted to [facility] on 05/31/2011. The patient was taken to the operating room and underwent the above procedure.    The postoperative course has generally been uneventful. She did develop dysuria and urinary frequency and a preliminary urinalysis revealed many bacteria and leukocytosis. She was started empirically on Cipro and a urine culture remains pending at the time of this dictation. Her pulmonary status has remained stable. Her chest tubes have been removed in the standard fashion and a followup chest x-ray is stable. Her final pathology was positive for  well-differentiated adenocarcinoma (T1b, N0).  She has remained in rate-controlled atrial fibrillation and was restarted on her home medications. Her INR is trending upward. She will undergo a PA and lateral chest x-ray on 06/08/2011. If she has otherwise remained stable, we anticipate discharge home.   Recent vital signs:  Filed Vitals:   06/07/11 1345  BP: 108/63  Pulse: 88  Temp: 98.2 F (36.8 C)  Resp: 18    Recent laboratory studies:  CBC: Basename 06/05/11 0445  WBC 10.9*  HGB 12.8  HCT 38.5  PLT 278   BMET:  Basename 06/05/11 0445  NA 138  K 4.2  CL 102  CO2 27  GLUCOSE 127*  BUN 12  CREATININE 0.54  CALCIUM 8.7    PT/INR:  Basename 06/07/11 0550  LABPROT 19.5*  INR 1.62*    Discharge Medications:   Medication List  As of 06/07/2011  2:45 PM   TAKE these medications         acetaminophen 650 MG CR tablet   Commonly known as: TYLENOL   Take 650 mg by mouth every 8 (eight) hours as needed. For pain      ALPRAZolam 0.5 MG tablet   Commonly known as: XANAX   Take 0.5 mg by mouth at bedtime as needed. For sleep      ciprofloxacin 500 MG tablet   Commonly known as: CIPRO   Take 1 tablet (500 mg total) by mouth 2 (two) times  daily. X 7 days      diclofenac sodium 1 % Gel   Commonly known as: VOLTAREN   Apply 1 application topically 2 (two) times daily as needed. For neck pain      diltiazem 120 MG 24 hr capsule   Commonly known as: CARDIZEM CD   Take 120 mg by mouth daily.      fish oil-omega-3 fatty acids 1000 MG capsule   Take 1,200 g by mouth daily.      FLORAJEN3 PO   Take 1 tablet by mouth daily.      HAIR/SKIN/NAILS PO   Take 1 tablet by mouth daily.      METAMUCIL 0.52 G capsule   Generic drug: psyllium   Take 0.52 g by mouth daily.      traMADol 50 MG tablet   Commonly known as: ULTRAM   Take 1-2 tablets (50-100 mg total) by mouth every 6 (six) hours as needed for pain.      warfarin 5 MG tablet   Commonly known as: COUMADIN    Take 5 mg by mouth as directed. 28m Tues, Thurs, Sat.,  7.563mMon, Wed, Fri, Sun            Discharge Instructions:  The patient is to refrain from driving, heavy lifting or strenuous activity.  May shower daily and clean incisions with soap and water.  May resume regular diet.  Discharge Orders    Future Appointments: Provider: Department: Dept Phone: Center:   06/23/2011 8:30 AM LiMalen GauzeRN [facility] [phone]      Follow-up Information    Follow up with GERHARDT,EDWARD B, MD on 06/22/2011. (Have a chest x-ray at 2:30, see MD at 3:30)    Contact information:   [phone]      Have a PT and INR drawn to followup Coumadin within 48 hours of discharge.   Zanetta Dehaan H 06/07/2011, 2:45 PM

## 2011-06-07 NOTE — Progress Notes (Addendum)
                    [address]            [place] [address]          [phone]     7 Days Post-Op Procedure(s) (LRB): VIDEO BRONCHOSCOPY (N/A) VIDEO ASSISTED THORACOSCOPY (VATS)/rt upper Lobectomy  Subjective: C/O dysuria, urinary frequency.  Actually feels "a whole lot better" overall.  Breathing stable.  Objective: Vital signs in last 24 hours: Patient Vitals for the past 24 hrs:  BP Temp Temp src Pulse Resp SpO2 Weight  06/07/11 0443 136/61 mmHg 98.3 F (36.8 C) Oral 104  19  93 % 52.39 kg (115 lb 8 oz)  06/06/11 2019 132/82 mmHg 97.9 F (36.6 C) Oral 100  18  95 % -  06/06/11 1355 109/53 mmHg 97.6 F (36.4 C) Oral 102  18  96 % -   Current Weight  06/07/11 52.39 kg (115 lb 8 oz)     Intake/Output from previous day: 01/22 0701 - 01/23 0700 In: 720 [P.O.:720] Out: 1710 [Urine:1600; Chest Tube:110]    PHYSICAL EXAM:  Heart: irr irr Lungs: clear Wound: clean and dry Chest tube: no obvious air leak with cough  Lab Results: CBC: Basename 06/05/11 0445  WBC 10.9*  HGB 12.8  HCT 38.5  PLT 278   BMET:  Basename 06/05/11 0445  NA 138  K 4.2  CL 102  CO2 27  GLUCOSE 127*  BUN 12  CREATININE 0.54  CALCIUM 8.7    PT/INR:  Basename 06/07/11 0550  LABPROT 19.5*  INR 1.62*   CXR- slightly increased small right apical ptx vs apical space  Assessment/Plan: S/P Procedure(s) (LRB): VIDEO BRONCHOSCOPY (N/A) VIDEO ASSISTED THORACOSCOPY (VATS)/WEDGE RESECTION (Right) Chest tube- no air leak, slightly increased ptx vs space on XR.  Will d/w MD - ? D/c chest tube soon AF- continue Cardizem, Coumadin.  HR goes up to 130s with activity and at rest, pt aymptomatic.  Monitor.  Consider adding low dose beta blocker. Dysuria- urine appears thick and yellow.  Will send u/a, urine cx Continue ambulation, pulm toilet.   LOS: 7 days    COLLINS,GINA H 06/07/2011   Addendum:  D/W MD - plan d/c chest tube  UTI being treated culture pending I  have seen and examined Shaaron Adler and agree with the above assessment  and plan.  Grace Isaac MD Beeper [phone] Office [phone] 06/07/2011 1:12 PM

## 2011-06-07 NOTE — Progress Notes (Signed)
Pt. Ambulated 350 ft, with no complaints of SOB or pain. Pt. sats 95% on room air after ambulation. Will continue to monitor.

## 2011-06-07 NOTE — Progress Notes (Signed)
Patient ambulated approximately 650 ft with RN and tolerated well.  No stops for rest, no shortness of breath, patient energetic.  HR in 130's during the walk but returned to 110's when back to the room, asymptomatic, patient feeling well.  Left resting in bed with call bell within reach.  Will continue to monitor.  Randell Patient

## 2011-06-07 NOTE — Progress Notes (Signed)
Chest tube removed per protocol without difficulty. Patient tolerated procedure well.  CXR called to be done. Will continue to monitor. Natalie Carlson

## 2011-06-07 NOTE — Progress Notes (Signed)
Pt ambulated 568f, with no complaints of SOB or pain. Pt HR in the 120's during ambulation, then went back down to 90- low 100's once back in room. Pt sat 94% on RA after ambulation. Will continue to monitor.

## 2011-06-08 ENCOUNTER — Inpatient Hospital Stay (HOSPITAL_COMMUNITY): Payer: Medicare Other

## 2011-06-08 LAB — PROTIME-INR: Prothrombin Time: 20.3 seconds — ABNORMAL HIGH (ref 11.6–15.2)

## 2011-06-08 MED ORDER — WARFARIN SODIUM 5 MG PO TABS
5.0000 mg | ORAL_TABLET | Freq: Once | ORAL | Status: DC
  Filled 2011-06-08: qty 1

## 2011-06-08 NOTE — Progress Notes (Signed)
   CARE MANAGEMENT NOTE 06/08/2011  Patient:  Natalie Carlson, Natalie Carlson   Account Number:  [id number]  Date Initiated:  06/01/2011  Documentation initiated by:  [facility]  Subjective/Objective Assessment:   post op VATS - Lives at home alone - has children.     Action/Plan:   PTA, PT INDEPENDENT, LIVES ALONE.   Anticipated DC Date:  06/08/2011   Anticipated DC Plan:  [place] referral  Clinical Social Worker      DC Planning Services  CM consult      Choice offered to / List presented to:             Status of service:  Completed, signed off Medicare Important Message given?   (If response is "NO", the following Medicare IM given date fields will be blank) Date Medicare IM given:   Date Additional Medicare IM given:    Discharge Disposition:  HOME/SELF CARE  Per UR Regulation:  Reviewed for med. necessity/level of care/duration of stay  Comments:  06/07/11 Kemal Amores,RN,BSN 1100 MET WITH PT TO DISCUSS DISCHARGE PLANS.  PT WILL DC HOME WITH SON AND DAUGHTER IN LAW, WHO CAN PROVIDE CARE.  SHE DENIES ANY NEEDS FOR HOME.  06-06-11 7:55am Luz Lex, RNBSN [phone] UR Completed.  06-01-11 11:44am Luz Lex, [place] UR completed.

## 2011-06-08 NOTE — Progress Notes (Addendum)
                    [address]            [place] [address]          [phone]     8 Days Post-Op Procedure(s) (LRB): VIDEO BRONCHOSCOPY (N/A) VIDEO ASSISTED THORACOSCOPY (VATS)/WEDGE RESECTION (Right)  Subjective: Feels well, no complaints.  Dysuria, frequency improving.  Objective: Vital signs in last 24 hours: Patient Vitals for the past 24 hrs:  BP Temp Temp src Pulse Resp SpO2  06/08/11 0602 128/50 mmHg 97.9 F (36.6 C) Oral 97  18  100 %  06/07/11 2036 109/74 mmHg 98.1 F (36.7 C) Oral 108  18  96 %  06/07/11 1345 108/63 mmHg 98.2 F (36.8 C) Oral 88  18  92 %   Current Weight  06/07/11 52.39 kg (115 lb 8 oz)     Intake/Output from previous day: 01/23 0701 - 01/24 0700 In: 480 [P.O.:480] Out: 400 [Urine:400]    PHYSICAL EXAM:  Heart: irr irr Lungs: clear Wound: clean and dry   Lab Results: CBC:No results found for this basename: WBC:2,HGB:2,HCT:2,PLT:2 in the last 72 hours BMET: No results found for this basename: NA:2,K:2,CL:2,CO2:2,GLUCOSE:2,BUN:2,CREATININE:2,CALCIUM:2 in the last 72 hours  PT/INR:  Basename 06/08/11 0532  LABPROT 20.3*  INR 1.70*   CXR: stable to slightly increased R apical ptx/space  Assessment/Plan: S/P Procedure(s) (LRB): VIDEO BRONCHOSCOPY (N/A) VIDEO ASSISTED THORACOSCOPY (VATS)/WEDGE RESECTION (Right) Stable, doing well.   UTI- cx pending.  Started on Cipro. Possibly home later today if remains stable.   LOS: 8 days    COLLINS,GINA H 06/08/2011   CXR reviewed , small apical space oK to go home I have seen and examined Natalie Carlson and agree with the above assessment  and plan.  Grace Isaac MD Beeper [phone] Office [phone] 06/08/2011 10:41 AM

## 2011-06-08 NOTE — Progress Notes (Signed)
Pt. Discharged 06/08/2011  11:31 AM Discharge instructions reviewed with patient/family. Patient/family verbalized understanding. All Rx's given. Questions answered as needed. Pt. Discharged to home with family/self. Taken off unit via W/C. Lelon Ikard, Bettina Gavia RN

## 2011-06-09 ENCOUNTER — Encounter: Payer: Medicare Other | Admitting: *Deleted

## 2011-06-09 ENCOUNTER — Ambulatory Visit (INDEPENDENT_AMBULATORY_CARE_PROVIDER_SITE_OTHER): Payer: Medicare Other | Admitting: *Deleted

## 2011-06-09 DIAGNOSIS — Z7901 Long term (current) use of anticoagulants: Secondary | ICD-10-CM

## 2011-06-09 DIAGNOSIS — I4891 Unspecified atrial fibrillation: Secondary | ICD-10-CM

## 2011-06-09 LAB — URINE CULTURE: Culture  Setup Time: 201301230955

## 2011-06-13 ENCOUNTER — Ambulatory Visit (INDEPENDENT_AMBULATORY_CARE_PROVIDER_SITE_OTHER): Payer: Medicare Other | Admitting: *Deleted

## 2011-06-13 DIAGNOSIS — I4891 Unspecified atrial fibrillation: Secondary | ICD-10-CM

## 2011-06-13 DIAGNOSIS — Z7901 Long term (current) use of anticoagulants: Secondary | ICD-10-CM

## 2011-06-20 ENCOUNTER — Other Ambulatory Visit: Payer: Self-pay | Admitting: Cardiothoracic Surgery

## 2011-06-20 DIAGNOSIS — D381 Neoplasm of uncertain behavior of trachea, bronchus and lung: Secondary | ICD-10-CM

## 2011-06-22 ENCOUNTER — Ambulatory Visit
Admission: RE | Admit: 2011-06-22 | Discharge: 2011-06-22 | Disposition: A | Discharge status: Home / Self Care | Payer: Medicare Other | Source: Ambulatory Visit | Attending: Cardiothoracic Surgery | Admitting: Cardiothoracic Surgery

## 2011-06-22 ENCOUNTER — Encounter: Payer: Self-pay | Admitting: Cardiothoracic Surgery

## 2011-06-22 ENCOUNTER — Ambulatory Visit (INDEPENDENT_AMBULATORY_CARE_PROVIDER_SITE_OTHER): Payer: Self-pay | Admitting: Cardiothoracic Surgery

## 2011-06-22 VITALS — BP 140/89 | HR 94 | Resp 18 | Ht 63.0 in | Wt 114.0 lb

## 2011-06-22 DIAGNOSIS — Z902 Acquired absence of lung [part of]: Secondary | ICD-10-CM

## 2011-06-22 DIAGNOSIS — D381 Neoplasm of uncertain behavior of trachea, bronchus and lung: Secondary | ICD-10-CM

## 2011-06-22 DIAGNOSIS — Z9889 Other specified postprocedural states: Secondary | ICD-10-CM

## 2011-06-22 DIAGNOSIS — C3490 Malignant neoplasm of unspecified part of unspecified bronchus or lung: Secondary | ICD-10-CM

## 2011-06-22 MED ORDER — TRAMADOL HCL 50 MG PO TABS: 50.0000 mg | ORAL_TABLET | Freq: Four times a day (QID) | ORAL | Status: DC | PRN

## 2011-06-22 NOTE — Progress Notes (Signed)
[address]            [place] [address]          [place] Record #[id number] Date of Birth: January 08, 1930  No ref. provider found [name], MD, MD  Chief Complaint:   PostOp Follow Up Visit PROCEDURE PERFORMED: Bronchoscopy, right video-assisted thoracoscopy,  right upper lobe lung biopsy with right upper lobectomy and lymph node  Dissection. 05/31/2011  History of Present Illness:      Patient returns to the office stay in followup after her recent right upper lobectomy for EGFR +2.2 cm well-differentiated adenocarcinoma with negative nodes, pT1b,pN0. She is doing well postoperatively returned to near-normal activities at home she denies fever chills. She's had no night sweats she denies any shortness of breath. She had a culture positive urinary tract infection while in the hospital and has completed her course of Cipro for that.   History  Smoking status  . Never Smoker   Smokeless tobacco  . Never Used     Allergies  Allergen Reactions  . Doxycycline Swelling    SEVERE FACIAL SWELLING AND BURNING  . Entocort Ec (Budesonide)     Patient saw her PCP, Dr. Eula Fried and he made Dr. Laural Golden aware that he had d/c patient taking Entocort due to the side effects she was experiencing. SEVERE FACIAL BURNING  . Adhesive (Tape) Other (See Comments)    Took skin off  . Chocolate Other (See Comments)    Causes migraines  . Codeine     REACTION: nausea  . Morphine And Related Nausea Only  . Penicillins Other (See Comments)    Childhood allergy  . Uncoded Nonscreenable Allergen Other (See Comments)    rasberry   headaches    Current Outpatient Prescriptions  Medication Sig Dispense Refill  . acetaminophen (TYLENOL) 650 MG CR tablet Take 650 mg by mouth every 8 (eight) hours as needed. For pain      . ALPRAZolam (XANAX) 0.5 MG tablet Take 0.5 mg by mouth at bedtime as needed. For sleep      .  diclofenac sodium (VOLTAREN) 1 % GEL Apply 1 application topically 2 (two) times daily as needed. For neck pain      . diltiazem (CARDIZEM CD) 120 MG 24 hr capsule Take 120 mg by mouth daily.        . fish oil-omega-3 fatty acids 1000 MG capsule Take 1,200 g by mouth daily.       . Multiple Vitamins-Minerals (HAIR/SKIN/NAILS PO) Take 1 tablet by mouth daily.       . Probiotic Product (FLORAJEN3 PO) Take 1 tablet by mouth daily.       . psyllium (METAMUCIL) 0.52 G capsule Take 0.52 g by mouth daily.       . traMADol (ULTRAM) 50 MG tablet Take 1 tablet (50 mg total) by mouth every 6 (six) hours as needed.  30 tablet  1  . warfarin (COUMADIN) 5 MG tablet Take 5 mg by mouth as directed. 80m Tues, Thurs, Sat.,  7.573mMon, Wed, Fri, Sun         Physical Exam: BP 140/89  Pulse 94  Resp 18  Ht 5' 3"  (1.6 m)  Wt 114 lb (51.71 kg)  BMI 20.19 kg/m2  SpO2 98%  General appearance: alert, cooperative, appears stated age  and no distress Neurologic: intact Heart: irregularly irregular rhythm Lungs: clear to auscultation bilaterally Abdomen: soft, non-tender; bowel sounds normal; no masses,  no organomegaly Extremities: extremities normal, atraumatic, no cyanosis or edema and Homans sign is negative, no sign of DVT Wound: Right chest incisions are well-healed   Diagnostic Studies & Laboratory data:         Recent Radiology Findings: Dg Chest 2 View  06/22/2011  *RADIOLOGY REPORT*  Clinical Data: Status post lung surgery on the right.  CHEST - 2 VIEW  Comparison: Chest x-ray 06/08/2011.  Findings: Slight interval increase in size of what is now a moderate right-sided pleural hydropneumothorax (moderate pneumothorax, small volume of fluid).  Left lung is well-aerated. Mild diffuse interstitial prominence (chronic, unchanged compared to priors). No consolidative airspace disease.  Heart size is normal.  Surgical clips in the right hilar region related to recent mediastinal nodal dissection and wedge  resection of the right upper lobe.  IMPRESSION: 1.  Enlarging right-sided hydropneumothorax. 2.  Otherwise, the radiographic appearance of chest is similar to prior, as above.  Original Report Authenticated By: Etheleen Mayhew, M.D.      Recent Labs: Lab Results  Component Value Date   WBC 10.9* 06/05/2011   HGB 12.8 06/05/2011   HCT 38.5 06/05/2011   PLT 278 06/05/2011   GLUCOSE 127* 06/05/2011   ALT 17 06/02/2011   AST 28 06/02/2011   NA 138 06/05/2011   K 4.2 06/05/2011   CL 102 06/05/2011   CREATININE 0.54 06/05/2011   BUN 12 06/05/2011   CO2 27 06/05/2011   INR 2.3 06/13/2011      Assessment / Plan:     Patient is doing well following her recent right upper lobectomy for DGR F. positive well-differentiated adenocarcinoma 2.2 cm in size. She has a stable right upper lobe airspace without evidence of fever chills or other signs of infection. The film is little change from her early postoperative films. We'll continue to follow this with serial chest x-rays. I plan to see her back in 2 weeks with a followup PA and lateral chest She lives in [place] and multiple family members have seen Dr. Sonny Dandy and she would like to see him in followup.  Grace Isaac MD 06/22/2011 3:23 PM

## 2011-06-23 ENCOUNTER — Encounter: Payer: Medicare Other | Admitting: *Deleted

## 2011-07-04 ENCOUNTER — Ambulatory Visit (INDEPENDENT_AMBULATORY_CARE_PROVIDER_SITE_OTHER): Payer: Medicare Other | Admitting: *Deleted

## 2011-07-04 DIAGNOSIS — Z7901 Long term (current) use of anticoagulants: Secondary | ICD-10-CM

## 2011-07-04 DIAGNOSIS — I4891 Unspecified atrial fibrillation: Secondary | ICD-10-CM

## 2011-07-04 LAB — POCT INR: INR: 2.8

## 2011-07-06 ENCOUNTER — Ambulatory Visit
Admission: RE | Admit: 2011-07-06 | Discharge: 2011-07-06 | Disposition: A | Discharge status: Home / Self Care | Payer: Medicare Other | Source: Ambulatory Visit | Attending: Cardiothoracic Surgery | Admitting: Cardiothoracic Surgery

## 2011-07-06 ENCOUNTER — Ambulatory Visit (INDEPENDENT_AMBULATORY_CARE_PROVIDER_SITE_OTHER): Payer: Self-pay | Admitting: Cardiothoracic Surgery

## 2011-07-06 ENCOUNTER — Encounter: Payer: Self-pay | Admitting: Cardiothoracic Surgery

## 2011-07-06 ENCOUNTER — Other Ambulatory Visit: Payer: Self-pay | Admitting: Cardiothoracic Surgery

## 2011-07-06 VITALS — BP 131/88 | HR 100 | Resp 16 | Ht 63.0 in | Wt 114.0 lb

## 2011-07-06 DIAGNOSIS — C3490 Malignant neoplasm of unspecified part of unspecified bronchus or lung: Secondary | ICD-10-CM

## 2011-07-06 DIAGNOSIS — Z902 Acquired absence of lung [part of]: Secondary | ICD-10-CM

## 2011-07-06 DIAGNOSIS — Z9889 Other specified postprocedural states: Secondary | ICD-10-CM

## 2011-07-06 NOTE — Progress Notes (Signed)
[address]            [place] [address]          [place] Record #[id number] Date of Birth: 1930-04-05  No ref. provider found [name], MD, MD  Chief Complaint:   PostOp Follow Up Visit PROCEDURE PERFORMED: Bronchoscopy, right video-assisted thoracoscopy,  right upper lobe lung biopsy with right upper lobectomy and lymph node  Dissection. 05/31/2011  History of Present Illness:      Patient returns today for followup chest x-ray and visit after right upper lobectomy done 05/31/2011 for adenocarcinoma the right upper lobe pT1b, pN0   EGFR positive and ALK negative  History  Smoking status  . Never Smoker   Smokeless tobacco  . Never Used     Allergies  Allergen Reactions  . Doxycycline Swelling    SEVERE FACIAL SWELLING AND BURNING  . Entocort Ec (Budesonide)     Patient saw her PCP, Dr. Eula Fried and he made Dr. Laural Golden aware that he had d/c patient taking Entocort due to the side effects she was experiencing. SEVERE FACIAL BURNING  . Adhesive (Tape) Other (See Comments)    Took skin off  . Chocolate Other (See Comments)    Causes migraines  . Codeine     REACTION: nausea  . Morphine And Related Nausea Only  . Penicillins Other (See Comments)    Childhood allergy  . Uncoded Nonscreenable Allergen Other (See Comments)    rasberry   headaches    Current Outpatient Prescriptions  Medication Sig Dispense Refill  . acetaminophen (TYLENOL) 650 MG CR tablet Take 650 mg by mouth every 8 (eight) hours as needed. For pain      . ALPRAZolam (XANAX) 0.5 MG tablet Take 0.5 mg by mouth at bedtime as needed. For sleep      . diclofenac sodium (VOLTAREN) 1 % GEL Apply 1 application topically 2 (two) times daily as needed. For neck pain      . diltiazem (CARDIZEM CD) 120 MG 24 hr capsule Take 120 mg by mouth daily.        . fish oil-omega-3 fatty acids 1000 MG capsule Take 1,200 g by mouth daily.        . Multiple Vitamins-Minerals (HAIR/SKIN/NAILS PO) Take 1 tablet by mouth daily.       . Probiotic Product (FLORAJEN3 PO) Take 1 tablet by mouth daily.       . psyllium (METAMUCIL) 0.52 G capsule Take 0.52 g by mouth daily.       . traMADol (ULTRAM) 50 MG tablet Take 1 tablet (50 mg total) by mouth every 6 (six) hours as needed.  30 tablet  1  . warfarin (COUMADIN) 5 MG tablet Take 5 mg by mouth as directed. 56m Tues, Thurs, Sat.,  7.538mMon, Wed, Fri, Sun         Physical Exam: BP 131/88  Pulse 100  Resp 16  Ht 5' 3"  (1.6 m)  Wt 114 lb (51.71 kg)  BMI 20.19 kg/m2  SpO2 98%  General appearance: alert, cooperative, appears stated age and no distress Neurologic: intact Heart: irregularly irregular rhythm Lungs: clear to auscultation bilaterally, there is no wheezing on physical exam Abdomen: soft, non-tender; bowel sounds normal; no masses,  no organomegaly Extremities: extremities normal, atraumatic, no cyanosis or edema and Homans  sign is negative, no sign of DVT Wound: Right chest incisions are well-healed chest tube sutures were removed on the last visit the wound is now well-healed   Diagnostic Studies & Laboratory data:         Recent Radiology Findings: Dg Chest 2 View  07/06/2011  *RADIOLOGY REPORT*  Clinical Data: Cough, shortness of breath, right lung surgery in January 2013, follow-up  CHEST - 2 VIEW  Comparison: Chest x-ray of 06/22/2011  Findings: There has been a decrease in size of the right apical pneumothorax now approximately 5-10%.  The right pleural effusion has resolved.  The lungs remain hyperaerated consistent with COPD. Mediastinal contours are stable and there is peribronchial thickening consistent with chronic bronchitis.  The heart is within normal limits in size.  No acute bony abnormality is seen.  IMPRESSION:  1.  Decrease in right apical pneumothorax to 5-10%.  No right pleural effusion is seen. 2.  COPD and probable chronic bronchitis.  Original Report  Authenticated By: Joretta Bachelor, M.D.      Recent Labs: Lab Results  Component Value Date   WBC 10.9* 06/05/2011   HGB 12.8 06/05/2011   HCT 38.5 06/05/2011   PLT 278 06/05/2011   GLUCOSE 127* 06/05/2011   ALT 17 06/02/2011   AST 28 06/02/2011   NA 138 06/05/2011   K 4.2 06/05/2011   CL 102 06/05/2011   CREATININE 0.54 06/05/2011   BUN 12 06/05/2011   CO2 27 06/05/2011   INR 2.8 07/04/2011      Assessment / Plan:     Patient is doing well following her recent right upper lobectomy for DGR F. positive well-differentiated adenocarcinoma 2.2 cm in size. She has a  right upper lobe airspace that is resolving on chest x-ray now less than 5%,  without evidence of fever chills or other signs of infection. The film today is improved from her last film. We'll continue to follow this with serial chest x-rays. I plan to see her back in 2 months with a followup PA and lateral chest She lives in [place] and multiple family members have seen Dr. Sonny Dandy and she would like to see him in followup.  Grace Isaac MD 07/06/2011 1:52 PM

## 2011-08-01 ENCOUNTER — Ambulatory Visit (INDEPENDENT_AMBULATORY_CARE_PROVIDER_SITE_OTHER): Payer: Medicare Other | Admitting: *Deleted

## 2011-08-01 DIAGNOSIS — Z7901 Long term (current) use of anticoagulants: Secondary | ICD-10-CM

## 2011-08-01 DIAGNOSIS — I4891 Unspecified atrial fibrillation: Secondary | ICD-10-CM

## 2011-08-01 LAB — POCT INR: INR: 2.6

## 2011-08-18 ENCOUNTER — Other Ambulatory Visit: Payer: Self-pay | Admitting: *Deleted

## 2011-08-18 MED ORDER — WARFARIN SODIUM 5 MG PO TABS: 5.0000 mg | ORAL_TABLET | ORAL | Status: DC

## 2011-09-01 ENCOUNTER — Other Ambulatory Visit: Payer: Self-pay | Admitting: Cardiothoracic Surgery

## 2011-09-01 DIAGNOSIS — C3490 Malignant neoplasm of unspecified part of unspecified bronchus or lung: Secondary | ICD-10-CM

## 2011-09-07 ENCOUNTER — Encounter: Payer: Self-pay | Admitting: Cardiothoracic Surgery

## 2011-09-07 ENCOUNTER — Ambulatory Visit (INDEPENDENT_AMBULATORY_CARE_PROVIDER_SITE_OTHER): Payer: Medicare Other | Admitting: Cardiothoracic Surgery

## 2011-09-07 ENCOUNTER — Ambulatory Visit
Admission: RE | Admit: 2011-09-07 | Discharge: 2011-09-07 | Disposition: A | Discharge status: Home / Self Care | Payer: Medicare Other | Source: Ambulatory Visit | Attending: Cardiothoracic Surgery | Admitting: Cardiothoracic Surgery

## 2011-09-07 VITALS — BP 147/86 | HR 76 | Resp 16 | Ht 63.0 in | Wt 116.0 lb

## 2011-09-07 DIAGNOSIS — C3490 Malignant neoplasm of unspecified part of unspecified bronchus or lung: Secondary | ICD-10-CM

## 2011-09-07 DIAGNOSIS — Z9889 Other specified postprocedural states: Secondary | ICD-10-CM

## 2011-09-07 NOTE — Progress Notes (Signed)
[address]            [place] [address]          [facility] Record #[id number] Date of Birth: 1930/04/16  [name], MD [name], MD, MD  Chief Complaint:   PostOp Follow Up Visit PROCEDURE PERFORMED: Bronchoscopy, right video-assisted thoracoscopy,  right upper lobe lung biopsy with right upper lobectomy and lymph node  Dissection. 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 She has some discomfort in the right chest but this does not seem to be limiting her functional ability at all. She has seen Dr. Verdie Mosher in followup.  History  Smoking status  . Never Smoker   Smokeless tobacco  . Never Used     Allergies  Allergen Reactions  . Doxycycline Swelling    SEVERE FACIAL SWELLING AND BURNING  . Entocort Ec (Budesonide)     Patient saw her PCP, Dr. Eula Fried and he made Dr. Laural Golden aware that he had d/c patient taking Entocort due to the side effects she was experiencing. SEVERE FACIAL BURNING  . Adhesive (Tape) Other (See Comments)    Took skin off  . Chocolate Other (See Comments)    Causes migraines  . Codeine     REACTION: nausea  . Morphine And Related Nausea Only  . Penicillins Other (See Comments)    Childhood allergy  . Uncoded Nonscreenable Allergen Other (See Comments)    rasberry   headaches    Current Outpatient Prescriptions  Medication Sig Dispense Refill  . acetaminophen (TYLENOL) 650 MG CR tablet Take 650 mg by mouth every 8 (eight) hours as needed. For pain      . ALPRAZolam (XANAX) 0.5 MG tablet Take 0.5 mg by mouth at bedtime as needed. For sleep      . diclofenac sodium  (VOLTAREN) 1 % GEL Apply 1 application topically 2 (two) times daily as needed. For neck pain      . diltiazem (CARDIZEM CD) 120 MG 24 hr capsule Take 120 mg by mouth daily.        . fish oil-omega-3 fatty acids 1000 MG capsule Take 1,200 g by mouth daily.       . Multiple Vitamins-Minerals (HAIR/SKIN/NAILS PO) Take 1 tablet by mouth daily.       . Probiotic Product (FLORAJEN3 PO) Take 1 tablet by mouth daily.       . psyllium (METAMUCIL) 0.52 G capsule Take 0.52 g by mouth daily.       Marland Kitchen warfarin (COUMADIN) 5 MG tablet Take 1 tablet (5 mg total) by mouth as directed. 50m Tues, Thurs, Sat.,  7.547mMon, Wed, Fri, Sun  45 tablet  2  . traMADol (ULTRAM) 50 MG tablet Take 1 tablet (50 mg total) by mouth every 6 (six) hours as needed.  30 tablet  1  . DISCONTD: diltiazem (CARDIZEM CD) 120 MG 24 hr capsule Take 1 capsule (120 mg  total) by mouth daily.  30 capsule  6    Physical Exam: BP 147/86  Pulse 76  Resp 16  Ht 5' 3"  (1.6 m)  Wt 116 lb (52.617 kg)  BMI 20.55 kg/m2  SpO2 96%  General appearance: alert, cooperative, appears stated age and no distress Neurologic: intact Heart: irregularly irregular rhythm Lungs: clear to auscultation bilaterally, there is no wheezing on physical exam Abdomen: soft, non-tender; bowel sounds normal; no masses,  no organomegaly Extremities: extremities normal, atraumatic, no cyanosis or edema and Homans sign is negative, no sign of DVT Wound: Right chest incisions are well-healed chest tube sutures were removed on the last visit the wound is now well-healed   Diagnostic Studies & Laboratory data:         Recent Radiology Findings: Dg Chest 2 View  09/07/2011  *RADIOLOGY REPORT*  Clinical Data: 59-monthfollow-up appointment  CHEST - 2 VIEW  Comparison: 07/06/2011  Findings: Heart size is normal.  No pleural effusion or edema noted.  There is no airspace consolidation identified.  No pneumothorax identified.  Lungs are hyperinflated but clear.  There are  coarsened interstitial markings suggesting COPD.  IMPRESSION:  1.  COPD/emphysema. 2.  No acute findings.  Original Report Authenticated By: TAngelita Ingles M.D.      Recent Labs: Lab Results  Component Value Date   WBC 10.9* 06/05/2011   HGB 12.8 06/05/2011   HCT 38.5 06/05/2011   PLT 278 06/05/2011   GLUCOSE 127* 06/05/2011   ALT 17 06/02/2011   AST 28 06/02/2011   NA 138 06/05/2011   K 4.2 06/05/2011   CL 102 06/05/2011   CREATININE 0.54 06/05/2011   BUN 12 06/05/2011   CO2 27 06/05/2011   INR 2.6 08/01/2011      Assessment / Plan:     Patient is doing well following her recent right upper lobectomy for DGR F. positive well-differentiated adenocarcinoma 2.2 cm in size. She has a  right upper lobe airspace that has resolved he. The film today is improved from her last film. We'll continue to follow this with serial chest x-rays. I plan to see her back in 3 months with a followup  CT  chest EGrace IsaacMD 09/07/2011 12:42 PM

## 2011-09-07 NOTE — Patient Instructions (Signed)
Chest xray looks good Return 3 months with ct of chest follow up lung CA

## 2011-09-12 ENCOUNTER — Ambulatory Visit (INDEPENDENT_AMBULATORY_CARE_PROVIDER_SITE_OTHER): Payer: Medicare Other | Admitting: *Deleted

## 2011-09-12 DIAGNOSIS — Z7901 Long term (current) use of anticoagulants: Secondary | ICD-10-CM

## 2011-09-12 DIAGNOSIS — I4891 Unspecified atrial fibrillation: Secondary | ICD-10-CM

## 2011-10-03 ENCOUNTER — Ambulatory Visit (INDEPENDENT_AMBULATORY_CARE_PROVIDER_SITE_OTHER): Payer: Medicare Other | Admitting: Cardiology

## 2011-10-03 ENCOUNTER — Encounter: Payer: Self-pay | Admitting: Cardiology

## 2011-10-03 VITALS — BP 110/71 | HR 72 | Ht 63.0 in | Wt 116.0 lb

## 2011-10-03 DIAGNOSIS — C3411 Malignant neoplasm of upper lobe, right bronchus or lung: Secondary | ICD-10-CM | POA: Insufficient documentation

## 2011-10-03 DIAGNOSIS — E079 Disorder of thyroid, unspecified: Secondary | ICD-10-CM

## 2011-10-03 DIAGNOSIS — R918 Other nonspecific abnormal finding of lung field: Secondary | ICD-10-CM

## 2011-10-03 DIAGNOSIS — R222 Localized swelling, mass and lump, trunk: Secondary | ICD-10-CM

## 2011-10-03 DIAGNOSIS — I4891 Unspecified atrial fibrillation: Secondary | ICD-10-CM

## 2011-10-03 DIAGNOSIS — Z7901 Long term (current) use of anticoagulants: Secondary | ICD-10-CM

## 2011-10-03 DIAGNOSIS — C3490 Malignant neoplasm of unspecified part of unspecified bronchus or lung: Secondary | ICD-10-CM

## 2011-10-03 DIAGNOSIS — R0602 Shortness of breath: Secondary | ICD-10-CM

## 2011-10-03 NOTE — Progress Notes (Signed)
Natalie Real, MD, [facility] Board Certified in Adult Cardiovascular Medicine,Internal Medicine and Critical Care Medicine    CC: Routine followup of patient with permanent atrial fibrillation.  HPI:  The patient was recently diagnosed with adenocarcinoma of the lung stage IA. This was an incidental finding after she presented with right-sided flank pain and a chest x-ray was obtained. From a cardiac perspective she is stable. She denies any chest pain shortness of breath orthopnea PND. She has rare palpitations but not very short lived. The patient is in permanent atrial fibrillation after 2 failed cardioversions as well as use of antiarrhythmic drugs with flecainide. The patient after she underwent lobectomy, has regained much of her strength. 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 Of note is that the patient has no history of coronary artery disease and has been tried on several statins before in the setting of primary prevention which cause severe myalgias. There is no indication for this patient to be placed on statin therapy at this point.  PMH: reviewed and listed in Problem List in Electronic Records (and see below) Past Medical History  Diagnosis Date  . Thyroid disease   . Cataract of both eyes     surgically removed  . RLQ abdominal pain   . Blood in stool   . Mucus in stool   . Chronic diarrhea   . Hyperlipidemia     takes Fish Oil daily;was on Zetia but taken off by medical MD over a yr ago  . Colitis   . History of colon polyps   . Bronchitis     in the early 90's  . History of migraines   . Arthritis     hands  . Joint pain   . Back pain     arthritis  . Bruises easily     pt is on Coumadin  . Hemorrhoids   . Kidney stone     hx of around 1994-passed on its on  . Nocturia   . Anemia     in her younger years;was on iron  . Blood transfusion 1959  . Macular  degeneration, dry   . Arrhythmia     Cardiolite stress study 2011 normal LV function and no ischemia. Holter monitor June 2011--normal sinus rhythm on flecainide therapy  . Atrial fibrillation     takes Diltiazem and Coumadin  . Adenocarcinoma of lung      stage  1A  EGFR positive  ALK-negative    Past Surgical History  Procedure Date  . Cataract extraction 2010/2011  . Bladder tack 1996  . Colonoscopy 3 YRS AGO  . Thyroid surgery 1961  . Colonoscopy 03/06/2011    Procedure: COLONOSCOPY;  Surgeon: Rogene Houston, MD;  Location: [facility];  Service: Endoscopy;  Laterality: N/A;  7:30 am  . Tonsillectomy     as a child  . Esophagogastroduodenoscopy   . Bone scan 04/24/11  . Pft 12/12  . Cardioversion 11/02/09    x 2  . Video bronchoscopy 05/31/2011    Procedure: VIDEO BRONCHOSCOPY;  Surgeon: Grace Isaac, MD;  Location: [facility] OR;  Service: Thoracic;  Laterality: N/A;    Allergies/SH/FHX : available in Electronic Records for review  Allergies  Allergen Reactions  . Doxycycline Swelling    SEVERE FACIAL SWELLING AND BURNING  . Entocort Ec (Budesonide)     Patient saw  her PCP, Dr. Eula Fried and he made Dr. Laural Golden aware that he had d/c patient taking Entocort due to the side effects she was experiencing. SEVERE FACIAL BURNING  . Adhesive (Tape) Other (See Comments)    Took skin off  . Chocolate Other (See Comments)    Causes migraines  . Codeine     REACTION: nausea  . Morphine And Related Nausea Only  . Penicillins Other (See Comments)    Childhood allergy  . Uncoded Nonscreenable Allergen Other (See Comments)    rasberry   headaches  . Naprosyn (Naproxen) Other (See Comments)    flush  . Tramadol Itching   History   Social History  . Marital Status: Widowed    Spouse Name: N/A    Number of Children: N/A  . Years of Education: N/A   Occupational History  . Not on file.   Social History Main Topics  . Smoking status: Never Smoker   . Smokeless tobacco: Never  Used  . Alcohol Use: No  . Drug Use: No  . Sexually Active: No   Other Topics Concern  . Not on file   Social History Narrative   widowRetired from textiles   Family History  Problem Relation Age of Onset  . Stroke Mother   . Stroke Father   . Stroke Other     two siblings  . Heart disease Other     sibling  . Liver disease Paternal Grandmother   . Anesthesia problems Sister   . Hypotension Neg Hx   . Malignant hyperthermia Neg Hx   . Pseudochol deficiency Neg Hx     Medications: Current Outpatient Prescriptions  Medication Sig Dispense Refill  . acetaminophen (TYLENOL) 650 MG CR tablet Take 650 mg by mouth every 8 (eight) hours as needed. For pain      . ALPRAZolam (XANAX) 0.5 MG tablet Take 0.5 mg by mouth at bedtime as needed. For sleep      . diclofenac sodium (VOLTAREN) 1 % GEL Apply 1 application topically 2 (two) times daily as needed. For neck pain      . diltiazem (CARDIZEM CD) 120 MG 24 hr capsule Take 120 mg by mouth daily.        . fish oil-omega-3 fatty acids 1000 MG capsule Take 1,200 g by mouth daily.       . Multiple Vitamins-Minerals (HAIR/SKIN/NAILS PO) Take 1 tablet by mouth daily.       . Probiotic Product (FLORAJEN3 PO) Take 1 tablet by mouth daily.       . psyllium (METAMUCIL) 0.52 G capsule Take 0.52 g by mouth daily.       Marland Kitchen warfarin (COUMADIN) 5 MG tablet Take 1 tablet (5 mg total) by mouth as directed. 68m Tues, Thurs, Sat.,  7.577mMon, Wed, Fri, Sun  45 tablet  2  . DISCONTD: diltiazem (CARDIZEM CD) 120 MG 24 hr capsule Take 1 capsule (120 mg total) by mouth daily.  30 capsule  6    ROS: No nausea or vomiting. No fever or chills.No melena or hematochezia.No bleeding.No claudication  Physical Exam: BP 110/71  Pulse 72  Ht 5' 3"  (1.6 m)  Wt 116 lb (52.617 kg)  BMI 20.55 kg/m2 General: Well-nourished white female in no distress. Neck: Normal carotid upstroke no carotid bruits. No thyromegaly nonnodular thyroid. JVP is 6 cm Lungs: Clear breath  sounds bilaterally without wheezing Cardiac: Irregular rate and rhythm with normal S1-S2 no murmur rubs or gallops. Vascular: No edema. Normal  distal pulses Skin: Warm and dry Physcologic: Normal affect  12lead ECG: Not obtained Limited bedside ECHO:N/A No images are attached to the encounter.   Assessment and Plan  Adenocarcinoma of lung Patient now followed by oncology tumor was found incidentally. Reportedly the patient has a good prognosis and has not required any chemotherapy.  Atrial fibrillation The patient remains in permanent atrial fibrillation. Her heart rate is well controlled. She reports no palpitations. She had a negative ischemia workup in 2011 she does take Coumadin compliantly.  Long term current use of anticoagulant Stable on Coumadin. Patient is now on an every 6 week schedule.  THYROID DISORDER Followup her primary care physician  DYSPNEA Stable. No further workup required at the present time. Patient has no normal LV function by prior Cardiolite study    Patient Active Problem List  Diagnoses  . THYROID DISORDER  . Atrial fibrillation  . DYSPNEA  . Long term current use of anticoagulant  . Colitis  . Adenocarcinoma of lung

## 2011-10-03 NOTE — Assessment & Plan Note (Signed)
Stable on Coumadin. Patient is now on an every 6 week schedule.

## 2011-10-03 NOTE — Assessment & Plan Note (Signed)
The patient remains in permanent atrial fibrillation. Her heart rate is well controlled. She reports no palpitations. She had a negative ischemia workup in 2011 she does take Coumadin compliantly.

## 2011-10-03 NOTE — Assessment & Plan Note (Signed)
Stable. No further workup required at the present time. Patient has no normal LV function by prior Cardiolite study

## 2011-10-03 NOTE — Assessment & Plan Note (Signed)
Patient now followed by oncology tumor was found incidentally. Reportedly the patient has a good prognosis and has not required any chemotherapy.

## 2011-10-03 NOTE — Assessment & Plan Note (Signed)
Followup her primary care physician

## 2011-10-03 NOTE — Patient Instructions (Signed)

## 2011-10-20 ENCOUNTER — Encounter (INDEPENDENT_AMBULATORY_CARE_PROVIDER_SITE_OTHER): Payer: Self-pay

## 2011-10-24 ENCOUNTER — Ambulatory Visit (INDEPENDENT_AMBULATORY_CARE_PROVIDER_SITE_OTHER): Payer: Medicare Other | Admitting: *Deleted

## 2011-10-24 DIAGNOSIS — I4891 Unspecified atrial fibrillation: Secondary | ICD-10-CM

## 2011-10-24 DIAGNOSIS — Z7901 Long term (current) use of anticoagulants: Secondary | ICD-10-CM

## 2011-10-24 LAB — POCT INR: INR: 2.9

## 2011-11-08 ENCOUNTER — Other Ambulatory Visit: Payer: Self-pay | Admitting: Cardiothoracic Surgery

## 2011-11-08 DIAGNOSIS — C3490 Malignant neoplasm of unspecified part of unspecified bronchus or lung: Secondary | ICD-10-CM

## 2011-11-15 ENCOUNTER — Other Ambulatory Visit: Payer: Self-pay | Admitting: Cardiothoracic Surgery

## 2011-11-23 ENCOUNTER — Other Ambulatory Visit: Payer: Self-pay | Admitting: Cardiothoracic Surgery

## 2011-11-23 DIAGNOSIS — C3490 Malignant neoplasm of unspecified part of unspecified bronchus or lung: Secondary | ICD-10-CM

## 2011-12-08 ENCOUNTER — Ambulatory Visit (INDEPENDENT_AMBULATORY_CARE_PROVIDER_SITE_OTHER): Payer: Medicare Other | Admitting: *Deleted

## 2011-12-08 DIAGNOSIS — Z7901 Long term (current) use of anticoagulants: Secondary | ICD-10-CM

## 2011-12-08 DIAGNOSIS — I4891 Unspecified atrial fibrillation: Secondary | ICD-10-CM

## 2011-12-14 ENCOUNTER — Ambulatory Visit: Payer: Medicare Other | Admitting: Cardiothoracic Surgery

## 2011-12-14 ENCOUNTER — Other Ambulatory Visit: Payer: Medicare Other

## 2011-12-28 ENCOUNTER — Other Ambulatory Visit: Payer: Medicare Other

## 2011-12-28 ENCOUNTER — Ambulatory Visit: Payer: Medicare Other | Admitting: Cardiothoracic Surgery

## 2012-01-18 ENCOUNTER — Encounter: Payer: Self-pay | Admitting: Cardiothoracic Surgery

## 2012-01-18 ENCOUNTER — Ambulatory Visit (INDEPENDENT_AMBULATORY_CARE_PROVIDER_SITE_OTHER): Payer: Medicare Other | Admitting: Cardiothoracic Surgery

## 2012-01-18 ENCOUNTER — Ambulatory Visit
Admission: RE | Admit: 2012-01-18 | Discharge: 2012-01-18 | Disposition: A | Discharge status: Home / Self Care | Payer: Medicare Other | Source: Ambulatory Visit | Attending: Cardiothoracic Surgery | Admitting: Cardiothoracic Surgery

## 2012-01-18 VITALS — BP 130/77 | HR 69 | Resp 16 | Ht 63.0 in | Wt 113.0 lb

## 2012-01-18 DIAGNOSIS — C3410 Malignant neoplasm of upper lobe, unspecified bronchus or lung: Secondary | ICD-10-CM

## 2012-01-18 DIAGNOSIS — Z09 Encounter for follow-up examination after completed treatment for conditions other than malignant neoplasm: Secondary | ICD-10-CM

## 2012-01-18 DIAGNOSIS — C3490 Malignant neoplasm of unspecified part of unspecified bronchus or lung: Secondary | ICD-10-CM

## 2012-01-18 LAB — CREATININE, SERUM: Creat: 0.8 mg/dL (ref 0.50–1.10)

## 2012-01-18 LAB — BUN: BUN: 16 mg/dL (ref 6–23)

## 2012-01-18 MED ORDER — IOHEXOL 300 MG/ML  SOLN
75.0000 mL | Freq: Once | INTRAMUSCULAR | Status: AC | PRN
  Administered 2012-01-18: 75 mL via INTRAVENOUS

## 2012-01-18 NOTE — Patient Instructions (Addendum)
No changes, return in 6 months with ct scan of chest   Your lung cancer was   adenocarcinoma the right upper lobe pT1b, pN0   EGFR positive

## 2012-01-18 NOTE — Progress Notes (Signed)
[address]            [place] [address]          [place] Record #[id number] Date of Birth: 08-13-1929  [name], MD [name], MD  Chief Complaint:   PostOp Follow Up Visit PROCEDURE PERFORMED: Bronchoscopy, right video-assisted thoracoscopy,  right upper lobe lung biopsy with right upper lobectomy and lymph node  Dissection. 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 She has some discomfort in the right chest but this does not seem to be limiting her functional ability at all and is improved. Some SOB with going up steep hills otherwise no limitation  She has seen Dr. Verdie Mosher in followup.  History  Smoking status  . Never Smoker   Smokeless tobacco  . Never Used     Allergies  Allergen Reactions  . Doxycycline Swelling    SEVERE FACIAL SWELLING AND BURNING  . Entocort Ec (Budesonide)     Patient saw her PCP, Dr. Eula Fried and he made Dr. Laural Golden aware that he had d/c patient taking Entocort due to the side effects she was experiencing. SEVERE FACIAL BURNING  . Adhesive (Tape) Other (See Comments)    Took skin off  . Chocolate Other (See Comments)    Causes migraines  . Codeine     REACTION: nausea  . Morphine And Related Nausea Only  . Penicillins Other (See Comments)    Childhood allergy  . Uncoded Nonscreenable Allergen Other (See Comments)    rasberry   headaches  . Naprosyn (Naproxen) Other (See Comments)    flush  . Tramadol Itching    Current Outpatient Prescriptions  Medication Sig Dispense Refill    . acetaminophen (TYLENOL) 650 MG CR tablet Take 650 mg by mouth every 8 (eight) hours as needed. For pain      . ALPRAZolam (XANAX) 0.5 MG tablet Take 0.5 mg by mouth at bedtime as needed. For sleep      . diclofenac sodium (VOLTAREN) 1 % GEL Apply 1 application topically 2 (two) times daily as needed. For neck pain      . diltiazem (CARDIZEM CD) 120 MG 24 hr capsule Take 120 mg by mouth daily.        . fish oil-omega-3 fatty acids 1000 MG capsule Take 1,200 g by mouth daily.       . Multiple Vitamins-Minerals (HAIR/SKIN/NAILS PO) Take 1 tablet by mouth daily.       . Probiotic Product (FLORAJEN3 PO) Take 1 tablet by mouth daily.       . psyllium (METAMUCIL) 0.52 G capsule Take 0.52 g by mouth daily.       Marland Kitchen warfarin (COUMADIN) 5 MG tablet Take 1 tablet (5 mg total) by mouth as directed. 80m  Tues, Thurs, Sat.,  7.64m Mon, Wed, Fri, Sun  45 tablet  2  . DISCONTD: diltiazem (CARDIZEM CD) 120 MG 24 hr capsule Take 1 capsule (120 mg total) by mouth daily.  30 capsule  6   No current facility-administered medications for this visit.   Facility-Administered Medications Ordered in Other Visits  Medication Dose Route Frequency Provider Last Rate Last Dose  . iohexol (OMNIPAQUE) 300 MG/ML solution 75 mL  75 mL Intravenous Once PRN Medication Radiologist, MD   75 mL at 01/18/12 0956    Physical Exam: BP 130/77  Pulse 69  Resp 16  Ht 5' 3"  (1.6 m)  Wt 113 lb (51.256 kg)  BMI 20.02 kg/m2  SpO2 98%  General appearance: alert, cooperative, appears stated age and no distress Neurologic: intact Heart: irregularly irregular rhythm Lungs: clear to auscultation bilaterally, there is no wheezing on physical exam Abdomen: soft, non-tender; bowel sounds normal; no masses,  no organomegaly Extremities: extremities normal, atraumatic, no cyanosis or edema and Homans sign is negative, no sign of DVT Wound: Right chest incisions are well-healed    Diagnostic Studies & Laboratory data:         Recent  Radiology Findings: Ct Chest W Contrast  01/18/2012  *RADIOLOGY REPORT*  Clinical Data: Status post right upper lobectomy for lung cancer in 05/2011  CT CHEST WITH CONTRAST  Technique:  Multidetector CT imaging of the chest was performed following the standard protocol during bolus administration of intravenous contrast.  Contrast: 737mOMNIPAQUE IOHEXOL 300 MG/ML  SOLN  Comparison: PET CT dated 05/04/2011  Findings: Status post right upper lobectomy.  Mild pleural thickening along the minor fissure (series 100/image 15). Probable subpleural node being in the anterior right lower lobe (series 1/image 41).  6 mm patchy opacity in the left upper lobe (series 100/image 9), non-FDG-avid on PET, without a true nodular appearance when correlating with coronal images. 4 mm left upper lobe nodule (series 1/image 20), unchanged.  No pleural effusion or pneumothorax.  Visualized thyroid is mildly nodular.  The heart is normal in size.  No pericardial effusion.  No suspicious mediastinal, hilar, or axillary lymphadenopathy.  The visualized upper abdomen is unremarkable.  Mild degenerative changes of the visualized thoracolumbar spine.  IMPRESSION: Status post right upper lobectomy.  No evidence of recurrent or metastatic disease in the chest.  4 mm left upper lobe nodule, likely benign.  Attention on follow-up is suggested.   Original Report Authenticated By: SRJulian HyM.D.     Recent Labs: Lab Results  Component Value Date   WBC 10.9* 06/05/2011   HGB 12.8 06/05/2011   HCT 38.5 06/05/2011   PLT 278 06/05/2011   GLUCOSE 127* 06/05/2011   ALT 17 06/02/2011   AST 28 06/02/2011   NA 138 06/05/2011   K 4.2 06/05/2011   CL 102 06/05/2011   CREATININE 0.80 11/23/2011   BUN 16 11/23/2011   CO2 27 06/05/2011   INR 2.3 12/08/2011      Assessment / Plan:   No evidence of recurrent Lung CA on exam or CT of Chest today Patient is doing well following her recent right upper lobectomy for EGRF positive  well-differentiated adenocarcinoma 2.2 cm in size.  We'll continue to follow this with serial ct scans. I plan to see her back in6m37monthith a followup  CT  Chest   EdwGrace Isaac 01/18/2012 10:12 AM

## 2012-01-19 ENCOUNTER — Other Ambulatory Visit: Payer: Self-pay | Admitting: Cardiology

## 2012-01-19 ENCOUNTER — Ambulatory Visit (INDEPENDENT_AMBULATORY_CARE_PROVIDER_SITE_OTHER): Payer: Medicare Other | Admitting: *Deleted

## 2012-01-19 DIAGNOSIS — I4891 Unspecified atrial fibrillation: Secondary | ICD-10-CM

## 2012-01-19 DIAGNOSIS — Z7901 Long term (current) use of anticoagulants: Secondary | ICD-10-CM

## 2012-01-19 LAB — POCT INR: INR: 3.7

## 2012-01-24 ENCOUNTER — Encounter: Payer: Medicare Other | Admitting: Hematology and Oncology

## 2012-01-24 DIAGNOSIS — C3410 Malignant neoplasm of upper lobe, unspecified bronchus or lung: Secondary | ICD-10-CM

## 2012-02-06 ENCOUNTER — Ambulatory Visit (INDEPENDENT_AMBULATORY_CARE_PROVIDER_SITE_OTHER): Payer: Medicare Other | Admitting: *Deleted

## 2012-02-06 DIAGNOSIS — Z7901 Long term (current) use of anticoagulants: Secondary | ICD-10-CM

## 2012-02-06 DIAGNOSIS — I4891 Unspecified atrial fibrillation: Secondary | ICD-10-CM

## 2012-02-06 LAB — POCT INR: INR: 3.2

## 2012-02-08 ENCOUNTER — Other Ambulatory Visit: Payer: Medicare Other

## 2012-02-08 ENCOUNTER — Ambulatory Visit: Payer: Medicare Other | Admitting: Cardiothoracic Surgery

## 2012-02-27 ENCOUNTER — Ambulatory Visit (INDEPENDENT_AMBULATORY_CARE_PROVIDER_SITE_OTHER): Payer: Medicare Other | Admitting: *Deleted

## 2012-02-27 DIAGNOSIS — Z7901 Long term (current) use of anticoagulants: Secondary | ICD-10-CM

## 2012-02-27 DIAGNOSIS — I4891 Unspecified atrial fibrillation: Secondary | ICD-10-CM

## 2012-02-27 LAB — POCT INR: INR: 1.8

## 2012-03-15 ENCOUNTER — Ambulatory Visit (INDEPENDENT_AMBULATORY_CARE_PROVIDER_SITE_OTHER): Payer: Medicare Other | Admitting: *Deleted

## 2012-03-15 DIAGNOSIS — Z7901 Long term (current) use of anticoagulants: Secondary | ICD-10-CM

## 2012-03-15 DIAGNOSIS — I4891 Unspecified atrial fibrillation: Secondary | ICD-10-CM

## 2012-03-15 LAB — POCT INR: INR: 2.3

## 2012-03-20 ENCOUNTER — Encounter: Payer: Self-pay | Admitting: Cardiology

## 2012-03-20 ENCOUNTER — Other Ambulatory Visit: Payer: Self-pay | Admitting: Physician Assistant

## 2012-03-20 ENCOUNTER — Ambulatory Visit (INDEPENDENT_AMBULATORY_CARE_PROVIDER_SITE_OTHER): Payer: Medicare Other | Admitting: Cardiology

## 2012-03-20 VITALS — BP 126/79 | HR 80 | Ht 63.0 in | Wt 110.0 lb

## 2012-03-20 DIAGNOSIS — I4891 Unspecified atrial fibrillation: Secondary | ICD-10-CM

## 2012-03-20 DIAGNOSIS — C3490 Malignant neoplasm of unspecified part of unspecified bronchus or lung: Secondary | ICD-10-CM

## 2012-03-20 NOTE — Assessment & Plan Note (Signed)
Patient doing quite well, had followup with Dr. Servando Snare earlier in the year.

## 2012-03-20 NOTE — Assessment & Plan Note (Signed)
Permanent, now managed with strategy of heart rate control and anticoagulation. She has been doing well. No changes made today.

## 2012-03-20 NOTE — Progress Notes (Signed)
Clinical Summary Ms. Bachicha is a 78 y.o.female presenting for followup. She is a former patient of Dr. Dannielle Burn, prefers to stay with [facility]. She was last seen in May. She states that she has been doing very well. No significant palpitations or progressive breathlessness. Still does all of her house chores including outdoor work.  She reports compliance with her cardiac medications, no bleeding problems on Coumadin.  Allergies  Allergen Reactions  . Doxycycline Swelling    SEVERE FACIAL SWELLING AND BURNING  . Entocort Ec (Budesonide)     Patient saw her PCP, Dr. Eula Fried and he made Dr. Laural Golden aware that he had d/c patient taking Entocort due to the side effects she was experiencing. SEVERE FACIAL BURNING  . Adhesive (Tape) Other (See Comments)    Took skin off  . Chocolate Other (See Comments)    Causes migraines  . Codeine     REACTION: nausea  . Morphine And Related Nausea Only  . Penicillins Other (See Comments)    Childhood allergy  . Prednisone     Itching   . Uncoded Nonscreenable Allergen Other (See Comments)    rasberry   headaches  . Naprosyn (Naproxen) Other (See Comments)    flush  . Tramadol Itching    Current Outpatient Prescriptions  Medication Sig Dispense Refill  . acetaminophen (TYLENOL) 650 MG CR tablet Take 650 mg by mouth every 8 (eight) hours as needed. For pain      . ALPRAZolam (XANAX) 0.5 MG tablet Take 0.5 mg by mouth at bedtime as needed. For sleep      . diclofenac sodium (VOLTAREN) 1 % GEL Apply 1 application topically 2 (two) times daily as needed. For neck pain      . diltiazem (CARDIZEM CD) 120 MG 24 hr capsule TAKE 1 CAPSULE BY MOUTH DAILY.  30 capsule  1  . fish oil-omega-3 fatty acids 1000 MG capsule Take 1,200 g by mouth daily.       Marland Kitchen HYDROcodone-acetaminophen (VICODIN) 5-500 MG per tablet 1 tablet 3 (three) times daily as needed.       . Multiple Vitamins-Minerals (HAIR/SKIN/NAILS PO) Take 1 tablet by mouth daily.       Marland Kitchen warfarin  (COUMADIN) 5 MG tablet TAKE 1 TABLET BY MOUTH ON TUES, THURS, AND SAT. THEN TAKE 1 & 1/2 TABLETS ON MON, WED, FRI AND SUN.  45 tablet  1  . [DISCONTINUED] diltiazem (CARDIZEM CD) 120 MG 24 hr capsule Take 1 capsule (120 mg total) by mouth daily.  30 capsule  6    Past Medical History  Diagnosis Date  . Hypothyroidism   . Cataract of both eyes   . Blood in stool   . Mucus in stool   . Chronic diarrhea   . Hyperlipidemia     Takes Fish Oil daily;was on Zetia but taken off by medical MD over a yr ago  . Colitis   . History of colon polyps   . History of migraines   . Arthritis   . Chronic back pain   . Hemorrhoids   . Nephrolithiasis     1994 - passed on its on  . Macular degeneration, dry   . Atrial fibrillation   . Adenocarcinoma of lung     Stage  1A  EGFR positive  ALK-negative     Social History Ms. Helgeson reports that she has never smoked. She has never used smokeless tobacco. Ms. Buser reports that she does not drink alcohol.  Review  of Systems No orthopnea or PND. Stable appetite. Otherwise negative.  Physical Examination Filed Vitals:   03/20/12 1010  BP: 126/79  Pulse: 80   Filed Weights   03/20/12 1010  Weight: 110 lb (49.896 kg)   No acute distress. HEENT: Conjunctiva and lids normal, oropharynx clear. Neck: Supple, no elevated JVP or carotid bruits, no thyromegaly. Lungs: Diminished but clear to auscultation, nonlabored breathing at rest. Cardiac: Irregularly irregular, no S3 or significant systolic murmur, no pericardial rub. Abdomen: Soft, nontender, bowel sounds present. Extremities: No pitting edema, distal pulses 2+. Skin: Warm and dry. Musculoskeletal: No kyphosis. Neuropsychiatric: Alert and oriented x3, affect grossly appropriate.   Problem List and Plan   Atrial fibrillation Permanent, now managed with strategy of heart rate control and anticoagulation. She has been doing well. No changes made today.  Adenocarcinoma of lung Patient  doing quite well, had followup with Dr. Servando Snare earlier in the year.    Satira Sark, M.D., F.A.C.C.

## 2012-03-20 NOTE — Patient Instructions (Addendum)

## 2012-04-16 ENCOUNTER — Ambulatory Visit (INDEPENDENT_AMBULATORY_CARE_PROVIDER_SITE_OTHER): Payer: Medicare Other | Admitting: *Deleted

## 2012-04-16 DIAGNOSIS — I4891 Unspecified atrial fibrillation: Secondary | ICD-10-CM

## 2012-04-16 DIAGNOSIS — Z7901 Long term (current) use of anticoagulants: Secondary | ICD-10-CM

## 2012-04-16 LAB — POCT INR: INR: 2.9

## 2012-05-21 ENCOUNTER — Ambulatory Visit (INDEPENDENT_AMBULATORY_CARE_PROVIDER_SITE_OTHER): Payer: Medicare Other | Admitting: *Deleted

## 2012-05-21 DIAGNOSIS — Z7901 Long term (current) use of anticoagulants: Secondary | ICD-10-CM

## 2012-05-21 DIAGNOSIS — I4891 Unspecified atrial fibrillation: Secondary | ICD-10-CM

## 2012-06-07 ENCOUNTER — Ambulatory Visit (INDEPENDENT_AMBULATORY_CARE_PROVIDER_SITE_OTHER): Payer: Medicare Other | Admitting: *Deleted

## 2012-06-07 DIAGNOSIS — I4891 Unspecified atrial fibrillation: Secondary | ICD-10-CM

## 2012-06-07 DIAGNOSIS — Z7901 Long term (current) use of anticoagulants: Secondary | ICD-10-CM

## 2012-06-11 ENCOUNTER — Ambulatory Visit (INDEPENDENT_AMBULATORY_CARE_PROVIDER_SITE_OTHER): Payer: Medicare Other | Admitting: *Deleted

## 2012-06-11 DIAGNOSIS — I4891 Unspecified atrial fibrillation: Secondary | ICD-10-CM

## 2012-06-11 DIAGNOSIS — Z7901 Long term (current) use of anticoagulants: Secondary | ICD-10-CM

## 2012-06-24 ENCOUNTER — Other Ambulatory Visit: Payer: Self-pay | Admitting: *Deleted

## 2012-06-24 DIAGNOSIS — C3490 Malignant neoplasm of unspecified part of unspecified bronchus or lung: Secondary | ICD-10-CM

## 2012-07-02 ENCOUNTER — Ambulatory Visit (INDEPENDENT_AMBULATORY_CARE_PROVIDER_SITE_OTHER): Payer: Medicare Other | Admitting: *Deleted

## 2012-07-02 DIAGNOSIS — Z7901 Long term (current) use of anticoagulants: Secondary | ICD-10-CM

## 2012-07-02 DIAGNOSIS — I4891 Unspecified atrial fibrillation: Secondary | ICD-10-CM

## 2012-07-17 ENCOUNTER — Other Ambulatory Visit: Payer: Self-pay | Admitting: Cardiothoracic Surgery

## 2012-07-17 LAB — BUN: BUN: 12 mg/dL (ref 6–23)

## 2012-07-17 LAB — CREATININE, SERUM: Creat: 0.64 mg/dL (ref 0.50–1.10)

## 2012-07-18 ENCOUNTER — Ambulatory Visit (INDEPENDENT_AMBULATORY_CARE_PROVIDER_SITE_OTHER): Payer: Medicare Other | Admitting: Cardiothoracic Surgery

## 2012-07-18 ENCOUNTER — Encounter: Payer: Self-pay | Admitting: Cardiothoracic Surgery

## 2012-07-18 ENCOUNTER — Ambulatory Visit
Admission: RE | Admit: 2012-07-18 | Discharge: 2012-07-18 | Disposition: A | Discharge status: Home / Self Care | Payer: Medicare Other | Source: Ambulatory Visit | Attending: Cardiothoracic Surgery | Admitting: Cardiothoracic Surgery

## 2012-07-18 VITALS — BP 139/85 | HR 82 | Resp 20 | Ht 63.0 in | Wt 110.0 lb

## 2012-07-18 DIAGNOSIS — Z902 Acquired absence of lung [part of]: Secondary | ICD-10-CM

## 2012-07-18 DIAGNOSIS — Z9889 Other specified postprocedural states: Secondary | ICD-10-CM

## 2012-07-18 DIAGNOSIS — C3490 Malignant neoplasm of unspecified part of unspecified bronchus or lung: Secondary | ICD-10-CM

## 2012-07-18 DIAGNOSIS — Z85118 Personal history of other malignant neoplasm of bronchus and lung: Secondary | ICD-10-CM

## 2012-07-18 MED ORDER — IOHEXOL 300 MG/ML  SOLN
75.0000 mL | Freq: Once | INTRAMUSCULAR | Status: AC | PRN
  Administered 2012-07-18: 75 mL via INTRAVENOUS

## 2012-07-18 NOTE — Progress Notes (Signed)
[address]       [place] [address]             [place] Record #[id number] Date of Birth: 01-29-1930  Natalie Mccreedy, MD Natalie Mccreedy, MD  Chief Complaint:   PostOp Follow Up Visit PROCEDURE PERFORMED: Bronchoscopy, right video-assisted thoracoscopy,  right upper lobe lung biopsy with right upper lobectomy and lymph node  Dissection. 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 She has some discomfort in the right chest but this does not seem to be limiting her functional ability at all and is improved. going up steep hills   History  Smoking status  . Never Smoker   Smokeless tobacco  . Never Used     Allergies  Allergen Reactions  . Doxycycline Swelling    SEVERE FACIAL SWELLING AND BURNING  . Entocort Ec (Budesonide)     Patient saw her PCP, Dr. Eula Fried and he made Dr. Laural Golden aware that he had d/c patient taking Entocort due to the side effects she was experiencing. SEVERE FACIAL BURNING  . Adhesive (Tape) Other (See Comments)    Took skin off  . Chocolate Other (See Comments)    Causes migraines  . Codeine     REACTION: nausea  . Morphine And Related Nausea Only  . Penicillins Other (See Comments)    Childhood allergy  . Prednisone     Itching   . Uncoded Nonscreenable Allergen Other (See Comments)    rasberry   headaches  . Naprosyn (Naproxen) Other (See Comments)    flush  . Tramadol Itching    Current Outpatient Prescriptions  Medication Sig Dispense Refill  . acetaminophen (TYLENOL) 650 MG CR tablet  Take 650 mg by mouth every 8 (eight) hours as needed. For pain      . ALPRAZolam (XANAX) 0.5 MG tablet Take 0.5 mg by mouth at bedtime as needed. For sleep      . diclofenac sodium (VOLTAREN) 1 % GEL Apply 1 application topically 2 (two) times daily as needed. For neck pain      . diltiazem (CARDIZEM CD) 120 MG 24 hr capsule TAKE 1 CAPSULE BY MOUTH DAILY.  30 capsule  6  . fish oil-omega-3 fatty acids 1000 MG capsule Take 1,200 g by mouth daily.       . Multiple Vitamins-Minerals (HAIR/SKIN/NAILS PO) Take 1 tablet by mouth daily.       Marland Kitchen warfarin (COUMADIN) 5 MG tablet TAKE 1 TABLET BY MOUTH ON TUES, THURS, AND SAT. THEN TAKE 1 & 1/2 TABLETS ON MON, WED, FRI AND SUN.  45 tablet  6   No current facility-administered medications for this visit.    Physical Exam: BP 139/85  Pulse 82  Resp 20  Ht 5' 3"  (1.6 m)  Wt  110 lb (49.896 kg)  BMI 19.49 kg/m2  SpO2 92%  General appearance: alert, cooperative, appears stated age and no distress Neurologic: intact Heart: irregularly irregular rhythm Lungs: clear to auscultation bilaterally, there is no wheezing on physical exam Abdomen: soft, non-tender; bowel sounds normal; no masses,  no organomegaly Extremities: extremities normal, atraumatic, no cyanosis or edema and Homans sign is negative, no sign of DVT Wound: Right chest incisions are well-healed  He has no cervical or supraclavicular adenopathy no axillary adenopathy  Diagnostic Studies & Laboratory data:         Recent Radiology Findings: Ct Chest W Contrast  07/18/2012  *RADIOLOGY REPORT*  Clinical Data: History of adenocarcinoma of the right lung status post right upper lobectomy.  CT CHEST WITH CONTRAST  Technique:  Multidetector CT imaging of the chest was performed following the standard protocol during bolus administration of intravenous contrast.  Contrast: 40m OMNIPAQUE IOHEXOL 300 MG/ML  SOLN  Comparison: Chest CT 01/18/2012.  Findings:  Mediastinum: Heart size is normal. There is  no significant pericardial fluid, thickening or pericardial calcification. No pathologically enlarged mediastinal or hilar lymph nodes. Esophagus is unremarkable in appearance.   Diffusely heterogeneously enlarged thyroid gland, similar to prior examinations, most compatible with a multinodular goiter.  Lungs/Pleura: Postoperative changes of right upper lobectomy are redemonstrated.  No focal soft tissue mass is noted in the perihilar region of the right lung to suggest local recurrence of disease. Nodular thickening of the superior aspect of the major fissure is again noted and unchanged.  Scattered small calcified granulomas are again noted.  A 3 mm nodule in the anterior aspect of the left upper lobe (image 27 of series 4) is unchanged.  No other larger more suspicious appearing pulmonary nodules or masses are otherwise noted.  Upper Abdomen: Unremarkable.  Musculoskeletal: There are no aggressive appearing lytic or blastic lesions noted in the visualized portions of the skeleton.  IMPRESSION: 1.  Status post right upper lobectomy without evidence to suggest local recurrence of disease or new metastatic disease in the thorax.   Original Report Authenticated By: DVinnie Langton M.D.     Recent Labs: Lab Results  Component Value Date   WBC 10.9* 06/05/2011   HGB 12.8 06/05/2011   HCT 38.5 06/05/2011   PLT 278 06/05/2011   GLUCOSE 127* 06/05/2011   ALT 17 06/02/2011   AST 28 06/02/2011   NA 138 06/05/2011   K 4.2 06/05/2011   CL 102 06/05/2011   CREATININE 0.64 07/17/2012   BUN 12 07/17/2012   CO2 27 06/05/2011   INR 1.8 07/02/2012      Assessment / Plan:   No evidence of recurrent Lung CA on exam or CT of Chest today now about one year following surgical resection of the right upper lobe Patient is doing well following her recent right upper lobectomy for EGRF positive well-differentiated adenocarcinoma 2.2 cm in size.  We'll continue to follow this with serial ct scans. I plan to see her back in  640monthwith a followup  CT  Chest   EdGrace IsaacD 07/18/2012 3:42 PM

## 2012-07-18 NOTE — Patient Instructions (Signed)
Ct scan shows no recurrent lung Cancer Return in 6 months

## 2012-07-26 ENCOUNTER — Ambulatory Visit (INDEPENDENT_AMBULATORY_CARE_PROVIDER_SITE_OTHER): Payer: Medicare Other | Admitting: *Deleted

## 2012-07-26 DIAGNOSIS — I4891 Unspecified atrial fibrillation: Secondary | ICD-10-CM

## 2012-07-26 DIAGNOSIS — Z7901 Long term (current) use of anticoagulants: Secondary | ICD-10-CM

## 2012-07-26 LAB — POCT INR: INR: 2.6

## 2012-08-23 ENCOUNTER — Ambulatory Visit (INDEPENDENT_AMBULATORY_CARE_PROVIDER_SITE_OTHER): Payer: Medicare Other | Admitting: *Deleted

## 2012-08-23 DIAGNOSIS — Z7901 Long term (current) use of anticoagulants: Secondary | ICD-10-CM

## 2012-08-23 DIAGNOSIS — I4891 Unspecified atrial fibrillation: Secondary | ICD-10-CM

## 2012-08-23 LAB — POCT INR: INR: 5.1

## 2012-08-30 ENCOUNTER — Ambulatory Visit (INDEPENDENT_AMBULATORY_CARE_PROVIDER_SITE_OTHER): Payer: Medicare Other | Admitting: *Deleted

## 2012-08-30 DIAGNOSIS — Z7901 Long term (current) use of anticoagulants: Secondary | ICD-10-CM

## 2012-08-30 DIAGNOSIS — I4891 Unspecified atrial fibrillation: Secondary | ICD-10-CM

## 2012-08-30 LAB — POCT INR: INR: 1.9

## 2012-09-05 ENCOUNTER — Encounter (INDEPENDENT_AMBULATORY_CARE_PROVIDER_SITE_OTHER): Payer: Self-pay | Admitting: Internal Medicine

## 2012-09-05 ENCOUNTER — Ambulatory Visit (INDEPENDENT_AMBULATORY_CARE_PROVIDER_SITE_OTHER): Payer: Medicare Other | Admitting: Internal Medicine

## 2012-09-05 VITALS — BP 116/70 | HR 72 | Temp 97.7°F | Resp 18 | Ht 63.0 in | Wt 109.5 lb

## 2012-09-05 DIAGNOSIS — R198 Other specified symptoms and signs involving the digestive system and abdomen: Secondary | ICD-10-CM

## 2012-09-05 DIAGNOSIS — K649 Unspecified hemorrhoids: Secondary | ICD-10-CM

## 2012-09-05 DIAGNOSIS — R1031 Right lower quadrant pain: Secondary | ICD-10-CM

## 2012-09-05 MED ORDER — HYDROCORTISONE ACETATE 25 MG RE SUPP: 25.0000 mg | Freq: Every day | RECTAL | Status: DC

## 2012-09-05 MED ORDER — PSYLLIUM 28 % PO PACK: 1.0000 | PACK | Freq: Two times a day (BID) | ORAL | Status: DC

## 2012-09-05 NOTE — Patient Instructions (Signed)
Keep stool diary as to frequency and consistency of stools. Keep record as to the frequency of rectal discharge until office visit.

## 2012-09-05 NOTE — Progress Notes (Signed)
Presenting complaint;  Right low quadrant abdominal pain, diarrhea and rectal discharge.  History of present illness;  Patient is 79 year old Caucasian female who is here for scheduled visit requested by Dr. Eula Fried. She presents with worsening right low quadrant pain over the last 3  weeks. She's had this pain off and on for several months. Last year she was seen by Dr. Joya Salm and he recommended steroid injections. Patient states that there was delay of several weeks before she heard from their office and by that time she was feeling better and did not pursue with that mode of treatment. She did have MRI of lumbar spine and hips in October 2013 revealing scoliosis with progression of this disease and spurring on the right at L1-L2 and L2-L3; mild spinal stenosis at L3-4 with slight progression from the prior study of October 2005 she also had grade 1 slip at L4-L5. MRI of hip revealed asymmetric left femoral greater trochanteric bursitis with mild gluteus medius tendinitis. She she believes she has had back pain since 04/18/2013. Pain is located in right low quadrant and seemed to radiate posterior laterally. She has no pain when she is lying flat or sitting or standing. However if she stands or sitting position or from supine position she is appreciating pain which lasts a few minutes. She recently experienced bouts with diarrhea. She took 3 doses of Imodium and did not have a bowel movement for 4 days. She denies melena or rectal bleeding. She has occasional hematochezia and continues to complain of rectal discharge which is clear and almost daily. Her appetite is good. She has lost 5 pounds this year.   Current Medications: Current Outpatient Prescriptions  Medication Sig Dispense Refill  . acetaminophen (TYLENOL) 650 MG CR tablet Take 650 mg by mouth every 8 (eight) hours as needed. For pain      . ALPRAZolam (XANAX) 0.5 MG tablet Take 0.5 mg by mouth at bedtime as needed. For sleep      .  diclofenac sodium (VOLTAREN) 1 % GEL Apply 1 application topically 2 (two) times daily as needed. For neck pain      . diltiazem (CARDIZEM CD) 120 MG 24 hr capsule TAKE 1 CAPSULE BY MOUTH DAILY.  30 capsule  6  . fish oil-omega-3 fatty acids 1000 MG capsule Take 1,200 g by mouth daily.       . Multiple Vitamins-Minerals (HAIR/SKIN/NAILS PO) Take 1 tablet by mouth daily.       Marland Kitchen warfarin (COUMADIN) 5 MG tablet TAKE 1 TABLET BY MOUTH ON TUES, THURS, AND SAT. THEN TAKE 1 & 1/2 TABLETS ON MON, WED, FRI AND SUN.  45 tablet  6   No current facility-administered medications for this visit.   Past Medical History  Diagnosis Date  . Hypothyroidism   . Cataract of both eyes   . Blood in stool   . Mucus in stool   . Chronic diarrhea   . Hyperlipidemia     Takes Fish Oil daily;was on Zetia but taken off by medical MD over a yr ago  . Colitis   . History of colon polyps   . History of migraines   . Arthritis   . Chronic back pain   . Hemorrhoids   . Nephrolithiasis     1994 - passed on its on  . Macular degeneration, dry   . Atrial fibrillation   . Adenocarcinoma of lung     Stage  1A  EGFR positive  ALK-negative  Last colonoscopy was in October 2012 revealing normal terminal ileum mucosal erythema and friability with erosions involving ileocecal valve, 2 erosions at transverse colon one at sigmoid and focal ulceration proximal to dentate line as well as external hemorrhoids. At that time she presented with chronic rectal discharge which is at times blood-tinged and right low quadrant abdominal pain. Biopsy from ileocecal valve and rectum revealed active colitis and she was treated with budesonide but she could not tolerate it.  Objective: Blood pressure 116/70, pulse 72, temperature 97.7 F (36.5 C), temperature source Oral, resp. rate 18, height 5' 3"  (1.6 m), weight 109 lb 8 oz (49.669 kg). Patient is alert and in no acute distress Conjunctiva is pink. Sclera is nonicteric Oropharyngeal  mucosa is normal. No neck masses or thyromegaly noted. Cardiac exam with regular rhythm normal S1 and S2. No murmur or gallop noted. Lungs are clear to auscultation. Abdomen. Bowel sounds are normal. Palpation reveals soft abdomen without tenderness organomegaly or masses. Rectal examination reveals soft sentinel skin tags. No abnormality noted on digital exam. There was no stool in the vault but mucus is guaiac-negative.  No LE edema or clubbing noted.  Labs/studies Results: I reviewed reports of MRI of hip and hips done on 02/23/2011 provided by the patient. Results as above  Assessment:  #1. Right low quadrant abdominal pain appears to be referred for her back. Her history is very compelling S. pain is experienced primarily when she stands from supine or sitting position. #2. Recent onset of diarrhea lastly foodborne illness. She became constipated after 3 doses of Imodium. Stool C. difficile by Dr. Eula Fried was negative. No further workup needed. #3. Chronic rectal discharge. Rectal examination is unremarkable. She could have low-grade distal proctitis.    Plan:  Metamucil 3-4 g by mouth qd. Anusol-HC suppository 1 per rectum daily at bedtime for 2 weeks. Patient will stool diary of along with frequency of rectal discharge until office visit in two months.

## 2012-09-09 DIAGNOSIS — R198 Other specified symptoms and signs involving the digestive system and abdomen: Secondary | ICD-10-CM | POA: Insufficient documentation

## 2012-09-09 DIAGNOSIS — R1031 Right lower quadrant pain: Secondary | ICD-10-CM | POA: Insufficient documentation

## 2012-09-17 ENCOUNTER — Ambulatory Visit (INDEPENDENT_AMBULATORY_CARE_PROVIDER_SITE_OTHER): Payer: Medicare Other | Admitting: *Deleted

## 2012-09-17 DIAGNOSIS — Z7901 Long term (current) use of anticoagulants: Secondary | ICD-10-CM

## 2012-09-17 DIAGNOSIS — I4891 Unspecified atrial fibrillation: Secondary | ICD-10-CM

## 2012-09-17 LAB — POCT INR: INR: 3.5

## 2012-09-18 ENCOUNTER — Encounter (INDEPENDENT_AMBULATORY_CARE_PROVIDER_SITE_OTHER): Payer: Self-pay

## 2012-09-24 ENCOUNTER — Other Ambulatory Visit (INDEPENDENT_AMBULATORY_CARE_PROVIDER_SITE_OTHER): Payer: Self-pay | Admitting: *Deleted

## 2012-09-24 ENCOUNTER — Telehealth: Payer: Self-pay | Admitting: Cardiology

## 2012-09-24 ENCOUNTER — Encounter (INDEPENDENT_AMBULATORY_CARE_PROVIDER_SITE_OTHER): Payer: Self-pay | Admitting: *Deleted

## 2012-09-24 ENCOUNTER — Ambulatory Visit (INDEPENDENT_AMBULATORY_CARE_PROVIDER_SITE_OTHER): Payer: Medicare Other | Admitting: Internal Medicine

## 2012-09-24 ENCOUNTER — Encounter (INDEPENDENT_AMBULATORY_CARE_PROVIDER_SITE_OTHER): Payer: Self-pay | Admitting: Internal Medicine

## 2012-09-24 VITALS — BP 100/62 | HR 76 | Ht 63.0 in | Wt 107.6 lb

## 2012-09-24 DIAGNOSIS — R197 Diarrhea, unspecified: Secondary | ICD-10-CM

## 2012-09-24 NOTE — Patient Instructions (Addendum)
Sigmoidoscopy with Dr. Laural Golden

## 2012-09-24 NOTE — Progress Notes (Signed)
Subjective:     Patient ID: Natalie Carlson, female   DOB: 27-Dec-1929, 79 y.o.   MRN: [id number]  HPI Here today for f/u. She continues to have diarrhea. She tells me Sunday she had 3 loose stools. She took Imodium x 2 Sunday. She says Sunday her diarrhea was explosive.  She has been having diarrhea since the 5th of April.  She says her stools fall apart in the commode and float. She says the diarrhea occurs in the morning.  She did not have a BM yesterday.  She is having anywhere from 1-3 stools a day and all are loose. She also continues to have a rectal discharge, but she says it is better. She has had the rectal discharge x 2 yrs. Rectal discharge is clear. Her C-diff was negative by Dr. Qureshi.  She sometimes have rt lower quadrant pain.  She has lost 2 pounds since her last visit. Her appetite is good.  No rectal bleeding. 03/06/2011 Colonoscopy: rt lower quadrant pain, rectal discharge:  Findings:  Normal terminal ileum.  Mucosal erythema and friability with erosions involving both lips of ileocecal valve.  2 erosions at transverse and one at sigmoid colon.  Focal ulceration just proximal to dentate line.  Small hemorrhoids below the dentate line. Biopsy: Colon biopsy, illeocecal valve ulceration: Chronic active colitis with surface erosion. No evidence of dysplasia or malignancy.  Rectum: Hyperplastic polyp with surface erosion. No dysplasia, adenomatous changes or malignancy.        Review of Systems  see hpi Current Outpatient Prescriptions  Medication Sig Dispense Refill  . acetaminophen (TYLENOL) 650 MG CR tablet Take 650 mg by mouth every 8 (eight) hours as needed. For pain      . ALPRAZolam (XANAX) 0.5 MG tablet Take 0.5 mg by mouth at bedtime as needed. For sleep      . diclofenac sodium (VOLTAREN) 1 % GEL Apply 1 application topically 2 (two) times daily as needed. For neck pain      . diltiazem (CARDIZEM CD) 120 MG 24 hr capsule TAKE 1 CAPSULE BY MOUTH DAILY.  30  capsule  6  . fish oil-omega-3 fatty acids 1000 MG capsule Take 1,200 g by mouth daily.       . hydrocortisone (ANUSOL-HC) 25 MG suppository Place 1 suppository (25 mg total) rectally at bedtime.  14 suppository  0  . Multiple Vitamins-Minerals (HAIR/SKIN/NAILS PO) Take 1 tablet by mouth daily.       . psyllium (METAMUCIL SMOOTH TEXTURE) 28 % packet Take 1 packet by mouth 2 (two) times daily.      . warfarin (COUMADIN) 5 MG tablet TAKE 1 TABLET BY MOUTH ON TUES, THURS, AND SAT. THEN TAKE 1 & 1/2 TABLETS ON MON, WED, FRI AND SUN.  45 tablet  6   No current facility-administered medications for this visit.   Past Medical History  Diagnosis Date  . Hypothyroidism   . Cataract of both eyes   . Blood in stool   . Mucus in stool   . Chronic diarrhea   . Hyperlipidemia     Takes Fish Oil daily;was on Zetia but taken off by medical MD over a yr ago  . Colitis   . History of colon polyps   . History of migraines   . Arthritis   . Chronic back pain   . Hemorrhoids   . Nephrolithiasis     19 94 - passed on its on  . Macular degeneration, dry   . Atrial  fibrillation   . Adenocarcinoma of lung     Stage  1A  EGFR positive  ALK-negative    Past Surgical History  Procedure Laterality Date  . Cataract extraction  2010/2011  . Bladder tack  1996  . Colonoscopy  3 YRS AGO  . Thyroid surgery  1961  . Colonoscopy  03/06/2011    Procedure: COLONOSCOPY;  Surgeon: Rogene Houston, MD;  Location: [facility];  Service: Endoscopy;  Laterality: N/A;  7:30 am  . Tonsillectomy    . Esophagogastroduodenoscopy    . Cardioversion  11/02/09    x 2  . Video bronchoscopy  05/31/2011    Procedure: VIDEO BRONCHOSCOPY;  Surgeon: Grace Isaac, MD;  Location: [facility] OR;  Service: Thoracic;  Laterality: N/A;   Allergies  Allergen Reactions  . Doxycycline Swelling    SEVERE FACIAL SWELLING AND BURNING  . Entocort Ec (Budesonide)     Patient saw her PCP, Dr. Eula Fried and he made Dr. Laural Golden aware that he had  d/c patient taking Entocort due to the side effects she was experiencing. SEVERE FACIAL BURNING  . Neurontin (Gabapentin) Swelling    Per the patient she also has itching  . Vicodin (Hydrocodone-Acetaminophen) Rash    Rash,Redness, and itching  . Adhesive (Tape) Other (See Comments)    Took skin off  . Chocolate Other (See Comments)    Causes migraines  . Codeine     REACTION: nausea  . Morphine And Related Nausea Only  . Penicillins Other (See Comments)    Childhood allergy  . Prednisone     Itching   . Uncoded Nonscreenable Allergen Other (See Comments)    rasberry   headaches  . Naprosyn (Naproxen) Other (See Comments)    flush  . Tramadol Itching       Objective:   Physical Exam  Filed Vitals:   09/24/12 1412  BP: 100/62  Pulse: 76  Height: 5' 3"  (1.6 m)  Weight: 107 lb 9.6 oz (48.807 kg)   Alert and oriented. Skin warm and dry. Oral mucosa is moist.   . Sclera anicteric, conjunctivae is pink. Thyroid not enlarged. No cervical lymphadenopathy. Lungs clear. Heart regular rate and rhythm.  Abdomen is soft. Bowel sounds are positive. No hepatomegaly. No abdominal masses felt. No tenderness.  No edema to lower extremities.        Assessment:    Diarrhea. Hx of active colitis. She continues to have diarrhea.  Discussed with Dr. Laural Golden.      Plan:    Sigmoidoscopy with Dr. Laural Golden.

## 2012-09-24 NOTE — Telephone Encounter (Signed)
Please advise 

## 2012-09-24 NOTE — Telephone Encounter (Signed)
Scheduled for Sigmoidoscopy on 10-04-12 needs to know if she can come off coumdin for 5 days. Please call Lelon Frohlich [phone] Dr. Olevia Perches office

## 2012-09-25 ENCOUNTER — Encounter (HOSPITAL_COMMUNITY): Payer: Self-pay | Admitting: Pharmacy Technician

## 2012-09-25 DIAGNOSIS — R197 Diarrhea, unspecified: Secondary | ICD-10-CM | POA: Insufficient documentation

## 2012-09-25 NOTE — Telephone Encounter (Signed)
Yes, Coumadin could be held temporarily for endoscopic procedure if needed. Likely do not need to overlap with Lovenox with atrial fibrillation.

## 2012-09-26 ENCOUNTER — Telehealth: Payer: Self-pay | Admitting: *Deleted

## 2012-09-26 NOTE — Telephone Encounter (Signed)
Pt scheduled for sigmoidoscopy by Dr Laural Golden on 10/04/12.  OK to hold coumadin 5 days before procedure per Dr Domenic Polite.  Pt will restart coumadin night of procedure (if OK with Dr Laural Golden) @ 7.29m daily until INR check on 10/11/12.  Pt verbalized understanding.

## 2012-09-26 NOTE — Telephone Encounter (Signed)
Patient aware.

## 2012-09-26 NOTE — Telephone Encounter (Signed)
Fowarded to [employer] and Dr Laural Golden

## 2012-09-30 NOTE — Telephone Encounter (Signed)
Recommendations regarding stopping warfarin for few days noted.

## 2012-10-04 ENCOUNTER — Encounter (HOSPITAL_COMMUNITY)
Admission: RE | Disposition: A | Discharge status: Home / Self Care | Payer: Self-pay | Source: Ambulatory Visit | Attending: Internal Medicine

## 2012-10-04 ENCOUNTER — Ambulatory Visit (HOSPITAL_COMMUNITY)
Admission: RE | Admit: 2012-10-04 | Discharge: 2012-10-04 | Disposition: A | Discharge status: Home / Self Care | Payer: Medicare Other | Source: Ambulatory Visit | Attending: Internal Medicine | Admitting: Internal Medicine

## 2012-10-04 ENCOUNTER — Encounter (HOSPITAL_COMMUNITY): Payer: Self-pay | Admitting: *Deleted

## 2012-10-04 DIAGNOSIS — K6389 Other specified diseases of intestine: Secondary | ICD-10-CM | POA: Insufficient documentation

## 2012-10-04 DIAGNOSIS — Z880 Allergy status to penicillin: Secondary | ICD-10-CM | POA: Insufficient documentation

## 2012-10-04 DIAGNOSIS — R197 Diarrhea, unspecified: Secondary | ICD-10-CM

## 2012-10-04 DIAGNOSIS — Z91018 Allergy to other foods: Secondary | ICD-10-CM | POA: Insufficient documentation

## 2012-10-04 DIAGNOSIS — R198 Other specified symptoms and signs involving the digestive system and abdomen: Secondary | ICD-10-CM | POA: Insufficient documentation

## 2012-10-04 DIAGNOSIS — C3490 Malignant neoplasm of unspecified part of unspecified bronchus or lung: Secondary | ICD-10-CM | POA: Insufficient documentation

## 2012-10-04 DIAGNOSIS — G43909 Migraine, unspecified, not intractable, without status migrainosus: Secondary | ICD-10-CM | POA: Insufficient documentation

## 2012-10-04 DIAGNOSIS — E785 Hyperlipidemia, unspecified: Secondary | ICD-10-CM | POA: Insufficient documentation

## 2012-10-04 DIAGNOSIS — Z7901 Long term (current) use of anticoagulants: Secondary | ICD-10-CM | POA: Insufficient documentation

## 2012-10-04 DIAGNOSIS — Z888 Allergy status to other drugs, medicaments and biological substances status: Secondary | ICD-10-CM | POA: Insufficient documentation

## 2012-10-04 DIAGNOSIS — Z79899 Other long term (current) drug therapy: Secondary | ICD-10-CM | POA: Insufficient documentation

## 2012-10-04 DIAGNOSIS — K649 Unspecified hemorrhoids: Secondary | ICD-10-CM | POA: Insufficient documentation

## 2012-10-04 DIAGNOSIS — Z882 Allergy status to sulfonamides status: Secondary | ICD-10-CM | POA: Insufficient documentation

## 2012-10-04 DIAGNOSIS — I4891 Unspecified atrial fibrillation: Secondary | ICD-10-CM | POA: Insufficient documentation

## 2012-10-04 DIAGNOSIS — Z885 Allergy status to narcotic agent status: Secondary | ICD-10-CM | POA: Insufficient documentation

## 2012-10-04 DIAGNOSIS — E039 Hypothyroidism, unspecified: Secondary | ICD-10-CM | POA: Insufficient documentation

## 2012-10-04 DIAGNOSIS — K5289 Other specified noninfective gastroenteritis and colitis: Secondary | ICD-10-CM | POA: Insufficient documentation

## 2012-10-04 HISTORY — PX: FLEXIBLE SIGMOIDOSCOPY: SHX5431

## 2012-10-04 SURGERY — SIGMOIDOSCOPY, FLEXIBLE
Anesthesia: Moderate Sedation

## 2012-10-04 MED ORDER — STERILE WATER FOR IRRIGATION IR SOLN
Status: DC | PRN
  Administered 2012-10-04: 10:00:00

## 2012-10-04 MED ORDER — MIDAZOLAM HCL 5 MG/5ML IJ SOLN
INTRAMUSCULAR | Status: DC | PRN
  Administered 2012-10-04: 2 mg via INTRAVENOUS

## 2012-10-04 MED ORDER — MEPERIDINE HCL 25 MG/ML IJ SOLN
INTRAMUSCULAR | Status: DC | PRN
  Administered 2012-10-04: 25 mg via INTRAVENOUS

## 2012-10-04 MED ORDER — SODIUM CHLORIDE 0.9 % IV SOLN
INTRAVENOUS | Status: DC
  Administered 2012-10-04: 10:00:00 via INTRAVENOUS

## 2012-10-04 MED ORDER — MIDAZOLAM HCL 5 MG/5ML IJ SOLN
INTRAMUSCULAR | Status: AC
  Filled 2012-10-04: qty 10

## 2012-10-04 MED ORDER — MEPERIDINE HCL 100 MG/ML IJ SOLN
INTRAMUSCULAR | Status: AC
  Filled 2012-10-04: qty 1

## 2012-10-04 NOTE — Op Note (Addendum)
FLEXIBLE SIGMOIDOSCOPY  PROCEDURE REPORT  PATIENT:  Natalie Carlson  MR#:  [id number] Birthdate:  Feb 12, 1930, 79 y.o., female Endoscopist:  Dr. Rogene Houston, MD Referred By:  Dr. [place] Qureshi, MD Procedure Date: 10/04/2012  Procedure:   Flexible sigmoidoscopy.  Indications:  Patient is an 79 year old Caucasian female with chronic diarrhea and rectal discharge. Her last colonoscopy of October, 2012 she was noted to have ulceration in the ileocecal region as well as rectum and few scattered colonic diverticula. She was intolerant" and prednisone. She is undergoing diagnostic examination. Warfarin is on hold for this procedure.  Informed Consent:  The procedure and risks were reviewed with the patient and informed consent was obtained.  Medications:  Demerol 25 mg IV Versed 2 mg IV  Description of procedure:  After a digital rectal exam was performed, that colonoscope was advanced from the anus through the rectum into sigmoid colon. Scope was passed to 40 cm. As the scope was withdrawn the colonic mucosa was carefully examined. While in the rectum scope was retroflexed. Findings were noted and recorded.  Findings:  Multiple punctate erosions involving mucosa of sigmoid colon and rectum. Biopsies taken from sigmoid colon and rectum and submitted separately. Moderate hemorrhoids below the dentate line.   Therapeutic/Diagnostic Maneuvers Performed:  See above  Complications:  None   Impression:  Multiple erosions involving mucosa of sigmoid colon and rectum. Biopsy taken from these areas. Suspect we may be dealing with low-grade IBD.  Recommendations:  Standard instructions given. Patient will continue to hold warfarin until she is at back injection next week. I will contact patient with biopsy results and further recommendations.  Daci Stubbe U  10/04/2012 10:53 AM  CC: Dr. [name], MD & Dr. Rayne Du ref. provider found

## 2012-10-04 NOTE — H&P (Signed)
Natalie Carlson is an 79 y.o. female.   Chief Complaint: Patient is here for flexible sigmoidoscopy. HPI: Patient is a-year-old Caucasian female who presents with recurrent diarrhea rectal discharge. She had colonoscopy in October 20, and she had small ulcers and ileocecal region scattered erosion of colon and rectal ulcer. Biopsy was inconclusive for IBD. She she says she has good appetite but she keeps losing weight slowly. She also complains of right lower quadrant pain which is felt to be referred pain from the back problems.  Past Medical History  Diagnosis Date  . Hypothyroidism   . Cataract of both eyes   . Blood in stool   . Mucus in stool   . Chronic diarrhea   . Hyperlipidemia     Takes Fish Oil daily;was on Zetia but taken off by medical MD over a yr ago  . Colitis   . History of colon polyps   . History of migraines   . Arthritis   . Chronic back pain   . Hemorrhoids   . Macular degeneration, dry   . Adenocarcinoma of lung     Stage  1A  EGFR positive  ALK-negative   . Atrial fibrillation   . Nephrolithiasis     1994 - passed on its on    Past Surgical History  Procedure Laterality Date  . Cataract extraction  2010/2011  . Bladder tack  1996  . Colonoscopy  3 YRS AGO  . Thyroid surgery  1961  . Colonoscopy  03/06/2011    Procedure: COLONOSCOPY;  Surgeon: Rogene Houston, MD;  Location: [facility];  Service: Endoscopy;  Laterality: N/A;  7:30 am  . Tonsillectomy    . Esophagogastroduodenoscopy    . Cardioversion  11/02/09    x 2  . Video bronchoscopy  05/31/2011    Procedure: VIDEO BRONCHOSCOPY;  Surgeon: Grace Isaac, MD;  Location: [facility] OR;  Service: Thoracic;  Laterality: N/A;    Family History  Problem Relation Age of Onset  . Stroke Mother   . Stroke Father   . Stroke Other     two siblings  . Heart disease Other     sibling  . Liver disease Paternal Grandmother   . Anesthesia problems Sister   . Hypotension Neg Hx   . Malignant hyperthermia Neg  Hx   . Pseudochol deficiency Neg Hx    Social History:  reports that she has never smoked. She has never used smokeless tobacco. She reports that she does not drink alcohol or use illicit drugs.  Allergies:  Allergies  Allergen Reactions  . Doxycycline Swelling    SEVERE FACIAL SWELLING AND BURNING  . Entocort Ec (Budesonide)     Patient saw her PCP, Dr. Eula Fried and he made Dr. Laural Golden aware that he had d/c patient taking Entocort due to the side effects she was experiencing. SEVERE FACIAL BURNING  . Neurontin (Gabapentin) Swelling    Per the patient she also has itching  . Vicodin (Hydrocodone-Acetaminophen) Rash    Rash,Redness, and itching  . Adhesive (Tape) Other (See Comments)    Took skin off  . Chocolate Other (See Comments)    Causes migraines  . Codeine     REACTION: nausea  . Morphine And Related Nausea Only  . Penicillins Other (See Comments)    Childhood allergy  . Prednisone     Itching   . Uncoded Nonscreenable Allergen Other (See Comments)    rasberry   headaches  . Naprosyn (Naproxen)  Other (See Comments)    flush  . Tramadol Itching    Medications Prior to Admission  Medication Sig Dispense Refill  . diclofenac sodium (VOLTAREN) 1 % GEL Apply 1 application topically 2 (two) times daily as needed. For neck pain      . diltiazem (CARDIZEM CD) 120 MG 24 hr capsule TAKE 1 CAPSULE BY MOUTH DAILY.  30 capsule  6  . fish oil-omega-3 fatty acids 1000 MG capsule Take 1,200 g by mouth daily.       . Multiple Vitamins-Minerals (HAIR/SKIN/NAILS PO) Take 1 tablet by mouth daily.       . psyllium (METAMUCIL SMOOTH TEXTURE) 28 % packet Take 1 packet by mouth 2 (two) times daily.      Marland Kitchen warfarin (COUMADIN) 5 MG tablet TAKE 1 TABLET BY MOUTH ON TUES, THURS, AND SAT. THEN TAKE 1 & 1/2 TABLETS ON MON, WED, FRI AND SUN.  45 tablet  6  . acetaminophen (TYLENOL) 650 MG CR tablet Take 650 mg by mouth every 8 (eight) hours as needed. For pain      . ALPRAZolam (XANAX) 0.5 MG  tablet Take 0.5 mg by mouth at bedtime as needed. For sleep      . hydrocortisone (ANUSOL-HC) 25 MG suppository Place 1 suppository (25 mg total) rectally at bedtime.  14 suppository  0    No results found for this or any previous visit (from the past 48 hour(s)). No results found.  ROS  Blood pressure 123/79, pulse 87, temperature 97.4 F (36.3 C), temperature source Oral, resp. rate 18, SpO2 100.00%. Physical Exam  Constitutional:  A well-developed thin female in NAD  HENT:  Mouth/Throat: Oropharynx is clear and moist.  Eyes: Conjunctivae are normal. No scleral icterus.  Neck: No thyromegaly present.  Cardiovascular:  Irregular rhythm, normal S1 and S2. No murmur or gallop noted  Respiratory: Effort normal and breath sounds normal.  GI: Soft. She exhibits no distension and no mass. There is no tenderness.  Musculoskeletal: She exhibits no edema.  Lymphadenopathy:    She has no cervical adenopathy.  Neurological: She is alert.  Skin: Skin is warm and dry.     Assessment/Plan Chronic diarrhea and rectal discharge. History of proctitis and focal ileocolitis. Flexible sigmoidoscopy.  Kasie Leccese U 10/04/2012, 10:30 AM

## 2012-10-08 ENCOUNTER — Encounter (HOSPITAL_COMMUNITY): Payer: Self-pay | Admitting: Internal Medicine

## 2012-10-10 ENCOUNTER — Encounter (INDEPENDENT_AMBULATORY_CARE_PROVIDER_SITE_OTHER): Payer: Self-pay | Admitting: *Deleted

## 2012-10-10 ENCOUNTER — Telehealth (INDEPENDENT_AMBULATORY_CARE_PROVIDER_SITE_OTHER): Payer: Self-pay | Admitting: *Deleted

## 2012-10-10 NOTE — Telephone Encounter (Signed)
Dr. Laural Golden requested a 4 week f/u from DOS:10/10/12. He will be out of the office. Patient has an apt scheduled for 11/21/12, the week back in office, which is 6 weeks out.  Will this be okay?

## 2012-10-10 NOTE — Progress Notes (Signed)
Apt has been scheduled for 11/25/12 with Dr. Laural Golden.

## 2012-10-10 NOTE — Telephone Encounter (Signed)
Dr.Rehman the current appointment that the patient has is going to be fine.

## 2012-10-11 ENCOUNTER — Ambulatory Visit (INDEPENDENT_AMBULATORY_CARE_PROVIDER_SITE_OTHER): Payer: Medicare Other | Admitting: *Deleted

## 2012-10-11 DIAGNOSIS — I4891 Unspecified atrial fibrillation: Secondary | ICD-10-CM

## 2012-10-11 DIAGNOSIS — Z7901 Long term (current) use of anticoagulants: Secondary | ICD-10-CM

## 2012-10-15 ENCOUNTER — Ambulatory Visit (INDEPENDENT_AMBULATORY_CARE_PROVIDER_SITE_OTHER): Payer: Medicare Other | Admitting: *Deleted

## 2012-10-15 DIAGNOSIS — I4891 Unspecified atrial fibrillation: Secondary | ICD-10-CM

## 2012-10-15 DIAGNOSIS — Z7901 Long term (current) use of anticoagulants: Secondary | ICD-10-CM

## 2012-10-29 ENCOUNTER — Ambulatory Visit (INDEPENDENT_AMBULATORY_CARE_PROVIDER_SITE_OTHER): Payer: Medicare Other | Admitting: *Deleted

## 2012-10-29 DIAGNOSIS — I4891 Unspecified atrial fibrillation: Secondary | ICD-10-CM

## 2012-10-29 DIAGNOSIS — Z7901 Long term (current) use of anticoagulants: Secondary | ICD-10-CM

## 2012-11-04 ENCOUNTER — Ambulatory Visit (INDEPENDENT_AMBULATORY_CARE_PROVIDER_SITE_OTHER): Payer: Medicare Other | Admitting: Internal Medicine

## 2012-11-08 ENCOUNTER — Ambulatory Visit (INDEPENDENT_AMBULATORY_CARE_PROVIDER_SITE_OTHER): Payer: Medicare Other | Admitting: *Deleted

## 2012-11-08 DIAGNOSIS — I4891 Unspecified atrial fibrillation: Secondary | ICD-10-CM

## 2012-11-08 DIAGNOSIS — Z7901 Long term (current) use of anticoagulants: Secondary | ICD-10-CM

## 2012-11-08 LAB — POCT INR: INR: 2.2

## 2012-11-18 ENCOUNTER — Other Ambulatory Visit: Payer: Self-pay | Admitting: Cardiology

## 2012-11-18 MED ORDER — DILTIAZEM HCL ER COATED BEADS 120 MG PO CP24: 120.0000 mg | ORAL_CAPSULE | Freq: Every day | ORAL | Status: DC

## 2012-11-25 ENCOUNTER — Encounter (INDEPENDENT_AMBULATORY_CARE_PROVIDER_SITE_OTHER): Payer: Self-pay | Admitting: Internal Medicine

## 2012-11-25 ENCOUNTER — Ambulatory Visit (INDEPENDENT_AMBULATORY_CARE_PROVIDER_SITE_OTHER): Payer: Medicare Other | Admitting: Internal Medicine

## 2012-11-25 VITALS — BP 108/78 | HR 88 | Temp 98.4°F | Resp 20 | Ht 63.0 in | Wt 106.9 lb

## 2012-11-25 DIAGNOSIS — R198 Other specified symptoms and signs involving the digestive system and abdomen: Secondary | ICD-10-CM

## 2012-11-25 DIAGNOSIS — R634 Abnormal weight loss: Secondary | ICD-10-CM

## 2012-11-25 DIAGNOSIS — K6389 Other specified diseases of intestine: Secondary | ICD-10-CM | POA: Insufficient documentation

## 2012-11-25 MED ORDER — MESALAMINE 400 MG PO CPDR: 800.0000 mg | DELAYED_RELEASE_CAPSULE | Freq: Two times a day (BID) | ORAL | Status: DC

## 2012-11-25 NOTE — Progress Notes (Signed)
Presenting complaint;  Follow for diarrhea and rectal discharge. Patient also complains about weight loss.  Database;  Patient is 79 year old Caucasian female who is here for scheduled visit. She initially presented in October 2012 with rectal discharge right low quadrant abdominal pain and underwent colonoscopy which revealed ulceration involving ileocecal valve as well as rectum along with a few scattered erosions are transverse and sigmoid colon. She was treated with Canasa suppositories and apparently did not respond. She was seen in April this year of and treated with [facility] suppositories without improvement. She therefore underwent flexible sigmoidoscopy on 10/04/2012 and found to have multiple erosions involving sigmoid colon and rectum and hemorrhoids. Biopsy from sigmoid colon showed chronic active colitis but no crypt abscesses or granulomas were seen. Rectal biopsy showed surface erosions. Patient was begun on Apriso.  Subjective;  Patient states she finished samples of apriso about 2 weeks ago. Prescription was sent to her pharmacy she did not fill. She thought she only needed to take medication for one month. She is continuing to experience rectal discharge virtually every day. It's clear yellow and at times foul-smelling. She does not have nocturnal discharge. Diarrhea has improved. Now she can go 3-5 days and have normal bowel movements. He continues to have urge to have a bowel movement. She states she has a good appetite but does not understand why she keeps losing weight. She has lost close to 14 pounds in the last 10 months. She did experience some abdominal gurgling while on Apriso. She says TSH level II months ago was within normal limits.   Current Medications: Current Outpatient Prescriptions  Medication Sig Dispense Refill  . acetaminophen (TYLENOL) 650 MG CR tablet Take 650 mg by mouth every 8 (eight) hours as needed. For pain      . ALPRAZolam (XANAX) 0.5 MG tablet  Take 0.5 mg by mouth at bedtime as needed. For sleep      . diclofenac sodium (VOLTAREN) 1 % GEL Apply 1 application topically 2 (two) times daily as needed. For neck pain      . diltiazem (CARDIZEM CD) 120 MG 24 hr capsule Take 1 capsule (120 mg total) by mouth daily.  30 capsule  6  . fish oil-omega-3 fatty acids 1000 MG capsule Take 1,200 g by mouth daily.       . Multiple Vitamins-Minerals (HAIR/SKIN/NAILS PO) Take 1 tablet by mouth daily.       . psyllium (METAMUCIL SMOOTH TEXTURE) 28 % packet Take 1 packet by mouth 2 (two) times daily.      Marland Kitchen warfarin (COUMADIN) 5 MG tablet TAKE 1 TABLET BY MOUTH ON TUES, THURS, AND SAT. THEN TAKE 1 & 1/2 TABLETS ON MON, WED, FRI AND SUN.  45 tablet  6  . hydrocortisone (ANUSOL-HC) 25 MG suppository Place 1 suppository (25 mg total) rectally at bedtime.  14 suppository  0  . mesalamine (APRISO) 0.375 G 24 hr capsule Take 375 mg by mouth daily.       No current facility-administered medications for this visit.     Objective: Blood pressure 108/78, pulse 88, temperature 98.4 F (36.9 C), temperature source Oral, resp. rate 20, height 5' 3"  (1.6 m), weight 106 lb 14.4 oz (48.49 kg). Patient is alert and in no acute distress. Conjunctiva is pink. Sclera is nonicteric Oropharyngeal mucosa is normal. No neck masses or thyromegaly noted. Cardiac exam with regular rhythm normal S1 and S2. Grade 2/6 systolic ejection murmur best heard at LLSB. Lungs are clear to auscultation.  Abdomen. Bowel sounds are normal. Abdomen is soft and nontender without organomegaly or masses. No LE edema or clubbing noted.    Assessment:  #1. Rectosigmoiditis or distal colitis. Suspect she has low-grade Crohn's colitis but biopsy did not show granulomas. This condition would explain both the diarrhea and rectal discharge. She needs to continue mesalamine for several months if not indefinitely. #2. Weight loss. Weight loss does not appear to be due to anorexia are diminished  calorie intake. Recent TSH reportedly was normal.    Plan:  Will start patient on Delzicol 800 mg by mouth twice a day. 36 days supply given to patient(Apriso samples not available).  Prescription also sent to her pharmacy. If she cannot afford this medication she will call when she is close to running out of samples. Patient advised to eat snacks between meals. Office visit in 3 months.

## 2012-11-25 NOTE — Patient Instructions (Signed)
Eat snacks between meals. Notify if you have any side effects with Delzicol.

## 2012-12-03 ENCOUNTER — Ambulatory Visit (INDEPENDENT_AMBULATORY_CARE_PROVIDER_SITE_OTHER): Payer: Medicare Other | Admitting: *Deleted

## 2012-12-03 DIAGNOSIS — I4891 Unspecified atrial fibrillation: Secondary | ICD-10-CM

## 2012-12-03 DIAGNOSIS — Z7901 Long term (current) use of anticoagulants: Secondary | ICD-10-CM

## 2012-12-03 LAB — POCT INR: INR: 2.5

## 2012-12-17 ENCOUNTER — Other Ambulatory Visit: Payer: Self-pay | Admitting: Cardiology

## 2012-12-17 MED ORDER — WARFARIN SODIUM 5 MG PO TABS: ORAL_TABLET | ORAL | Status: DC

## 2012-12-17 NOTE — Telephone Encounter (Signed)
Refill

## 2013-01-14 ENCOUNTER — Ambulatory Visit (INDEPENDENT_AMBULATORY_CARE_PROVIDER_SITE_OTHER): Payer: Medicare Other | Admitting: *Deleted

## 2013-01-14 ENCOUNTER — Other Ambulatory Visit: Payer: Self-pay

## 2013-01-14 DIAGNOSIS — Z7901 Long term (current) use of anticoagulants: Secondary | ICD-10-CM

## 2013-01-14 DIAGNOSIS — C3490 Malignant neoplasm of unspecified part of unspecified bronchus or lung: Secondary | ICD-10-CM

## 2013-01-14 DIAGNOSIS — I4891 Unspecified atrial fibrillation: Secondary | ICD-10-CM

## 2013-02-04 ENCOUNTER — Ambulatory Visit (INDEPENDENT_AMBULATORY_CARE_PROVIDER_SITE_OTHER): Payer: Medicare Other | Admitting: *Deleted

## 2013-02-04 DIAGNOSIS — I4891 Unspecified atrial fibrillation: Secondary | ICD-10-CM

## 2013-02-04 DIAGNOSIS — Z7901 Long term (current) use of anticoagulants: Secondary | ICD-10-CM

## 2013-02-14 ENCOUNTER — Ambulatory Visit (INDEPENDENT_AMBULATORY_CARE_PROVIDER_SITE_OTHER): Payer: Medicare Other | Admitting: Internal Medicine

## 2013-02-14 ENCOUNTER — Encounter (INDEPENDENT_AMBULATORY_CARE_PROVIDER_SITE_OTHER): Payer: Self-pay | Admitting: Internal Medicine

## 2013-02-14 VITALS — BP 108/72 | HR 88 | Temp 96.9°F | Resp 20 | Ht 63.0 in | Wt 109.0 lb

## 2013-02-14 DIAGNOSIS — R198 Other specified symptoms and signs involving the digestive system and abdomen: Secondary | ICD-10-CM

## 2013-02-14 DIAGNOSIS — R634 Abnormal weight loss: Secondary | ICD-10-CM

## 2013-02-14 DIAGNOSIS — K6389 Other specified diseases of intestine: Secondary | ICD-10-CM

## 2013-02-14 NOTE — Patient Instructions (Signed)
Notify if you are unable to afford Delzicol.

## 2013-02-14 NOTE — Progress Notes (Signed)
Presenting complaint;  Followup for proctosigmoiditis.  Subjective:  Patient is 79 year old Caucasian female who has proctosigmoiditis felt to be secondary to Crohn's disease who presents for scheduled visit. She was last seen on 11/25/2012. She states she is definitely doing better. On most days she has 1-2 formed stools. Diarrhea now is infrequent. She still has rectal discharge to 4 times a week and it's clear most of the time. Only on one occasion it was blood-tinged. Each time she has rectal discharge it is small in amount unlike previous times. She states her co-pay for mesalamine is $45 a month and she cannot afford to pay for this. She is not having any side effects with this medication. She states since she had 2 shots to her back she is having much less back pain as well as right lower quadrant abdominal pain. Her appetite is good and she has gained 3 pounds since her last visit.    Current Medications: Current Outpatient Prescriptions  Medication Sig Dispense Refill  . acetaminophen (TYLENOL) 650 MG CR tablet Take 650 mg by mouth every 8 (eight) hours as needed. For pain      . ALPRAZolam (XANAX) 0.5 MG tablet Take 0.5 mg by mouth at bedtime as needed. For sleep      . diclofenac sodium (VOLTAREN) 1 % GEL Apply 1 application topically 2 (two) times daily as needed. For neck pain      . diltiazem (CARDIZEM CD) 120 MG 24 hr capsule Take 1 capsule (120 mg total) by mouth daily.  30 capsule  6  . fish oil-omega-3 fatty acids 1000 MG capsule Take 1,200 g by mouth daily.       . Mesalamine (DELZICOL) 400 MG CPDR DR capsule Take 400 mg by mouth. Patient takes 2 tablets by mouth twice a day.      . Multiple Vitamins-Minerals (HAIR/SKIN/NAILS PO) Take 1 tablet by mouth daily.       . psyllium (METAMUCIL SMOOTH TEXTURE) 28 % packet Take 1 packet by mouth 2 (two) times daily.      Marland Kitchen warfarin (COUMADIN) 5 MG tablet Take 1 tablet by mouth on Tuesday, Thursday, and Sat. Then take 1 & 1/2 tablets  on Mon, Wed, Fri, and Sun  45 tablet  3   No current facility-administered medications for this visit.     Objective: Blood pressure 108/72, pulse 88, temperature 96.9 F (36.1 C), temperature source Oral, resp. rate 20, height 5' 3"  (1.6 m), weight 109 lb (49.442 kg). Patient is alert and in no acute distress. Conjunctiva is pink. Sclera is nonicteric Oropharyngeal mucosa is normal. No neck masses or thyromegaly noted. Abdomen is symmetrical and soft with minimal tenderness at RLQ. No organomegaly or masses noted.  No LE edema or clubbing noted.  Assessment:  #1. Proctosigmoiditis felt to be secondary to Crohn's colitis. Diarrhea has improved a great deal and rectal discharge has decreased as well. I believe rectal discharge will stop once she has had full benefit of therapy with mesalamine. Affordability is an issue. She will find out If her co-pay has decreased. #2. Weight loss. After losing 14 pounds in 10 months she is finally changing course and her weight is up by 3 pounds.   Plan:  Patient was given one bottle of delta called and few days supply of Apriso. She was also given written prescription for delzicol and call us if okay has not decreased. Office visit in one month.

## 2013-02-18 ENCOUNTER — Ambulatory Visit (INDEPENDENT_AMBULATORY_CARE_PROVIDER_SITE_OTHER): Payer: Medicare Other | Admitting: *Deleted

## 2013-02-18 DIAGNOSIS — I4891 Unspecified atrial fibrillation: Secondary | ICD-10-CM

## 2013-02-18 DIAGNOSIS — Z7901 Long term (current) use of anticoagulants: Secondary | ICD-10-CM

## 2013-02-18 LAB — POCT INR: INR: 2.1

## 2013-02-20 ENCOUNTER — Other Ambulatory Visit: Payer: Medicare Other

## 2013-02-20 ENCOUNTER — Ambulatory Visit: Payer: Medicare Other | Admitting: Cardiothoracic Surgery

## 2013-02-25 ENCOUNTER — Telehealth (INDEPENDENT_AMBULATORY_CARE_PROVIDER_SITE_OTHER): Payer: Self-pay | Admitting: *Deleted

## 2013-02-25 ENCOUNTER — Ambulatory Visit (INDEPENDENT_AMBULATORY_CARE_PROVIDER_SITE_OTHER): Payer: Medicare Other | Admitting: Internal Medicine

## 2013-02-25 NOTE — Telephone Encounter (Signed)
I rec'd a PA request for Delzicol. I reviewed the patient's last office visit note and then called Mrs. Langseth. She states that the Delzicol is not on her formulary. She states that the Dorthy Cooler is and she had taken this previous and would like to go back on it. Per Deberah Castle, Np may call in Apriso 0.375 , patient will take 4 by mouth daily #120 with 6 refills. This was called to [facility]. The patient was called and made aware.

## 2013-02-27 ENCOUNTER — Ambulatory Visit
Admission: RE | Admit: 2013-02-27 | Discharge: 2013-02-27 | Disposition: A | Discharge status: Home / Self Care | Payer: Medicare Other | Source: Ambulatory Visit | Attending: Cardiothoracic Surgery | Admitting: Cardiothoracic Surgery

## 2013-02-27 ENCOUNTER — Ambulatory Visit (INDEPENDENT_AMBULATORY_CARE_PROVIDER_SITE_OTHER): Payer: Medicare Other | Admitting: Cardiothoracic Surgery

## 2013-02-27 ENCOUNTER — Encounter: Payer: Self-pay | Admitting: Cardiothoracic Surgery

## 2013-02-27 DIAGNOSIS — C3410 Malignant neoplasm of upper lobe, unspecified bronchus or lung: Secondary | ICD-10-CM

## 2013-02-27 DIAGNOSIS — C3490 Malignant neoplasm of unspecified part of unspecified bronchus or lung: Secondary | ICD-10-CM

## 2013-02-27 DIAGNOSIS — C3491 Malignant neoplasm of unspecified part of right bronchus or lung: Secondary | ICD-10-CM

## 2013-02-27 DIAGNOSIS — Z902 Acquired absence of lung [part of]: Secondary | ICD-10-CM

## 2013-02-27 DIAGNOSIS — Z9889 Other specified postprocedural states: Secondary | ICD-10-CM

## 2013-02-27 NOTE — Progress Notes (Signed)
[address]       [place] [address]             [id number]                                     Natalie Carlson Bearden Medical Record #[id number] Date of Birth: Sep 14, 1929  Natalie Dolly, MD  Chief Complaint:   PostOp Follow Up Visit PROCEDURE PERFORMED 05/31/2011: Bronchoscopy, right video-assisted thoracoscopy,  right upper lobe lung biopsy with right upper lobectomy and lymph node  Dissection.   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 She has some discomfort in the right chest but this does not seem to be limiting her functional ability at all and is improved. going up steep hills   History  Smoking status  . Never Smoker   Smokeless tobacco  . Never Used     Allergies  Allergen Reactions  . Doxycycline Swelling    SEVERE FACIAL SWELLING AND BURNING  . Entocort Ec [Budesonide]     Patient saw her PCP, Dr. Eula Fried and he made Dr. Laural Golden aware that he had d/c patient taking Entocort due to the side effects she was experiencing. SEVERE FACIAL BURNING  . Neurontin [Gabapentin] Swelling    Per the patient she also has itching  . Vicodin [Hydrocodone-Acetaminophen] Rash    Rash,Redness, and itching  . Adhesive [Tape] Other (See Comments)    Took skin off  . Chocolate Other (See Comments)    Causes migraines  . Codeine     REACTION: nausea  . Morphine And Related Nausea Only  . Penicillins Other (See Comments)    Childhood allergy  . Prednisone     Itching   . Uncoded Nonscreenable Allergen Other (See Comments)    rasberry   headaches  . Naprosyn [Naproxen] Other (See  Comments)    flush  . Tramadol Itching    Current Outpatient Prescriptions  Medication Sig Dispense Refill  . acetaminophen (TYLENOL) 650 MG CR tablet Take 650 mg by mouth every 8 (eight) hours as needed. For pain      . ALPRAZolam (XANAX) 0.5 MG tablet Take 0.5 mg by mouth at bedtime as needed. For sleep      . diclofenac sodium (VOLTAREN) 1 % GEL Apply 1 application topically 2 (two) times daily as needed. For neck pain      . diltiazem (CARDIZEM CD) 120 MG 24 hr capsule Take 1 capsule (120 mg total) by mouth daily.  30 capsule  6  . fish oil-omega-3 fatty acids 1000 MG capsule Take 1,200 g by mouth daily.       . Mesalamine (DELZICOL) 400 MG CPDR DR capsule Take 400 mg by mouth. Patient takes 2 tablets by mouth twice a day.      . Multiple Vitamins-Minerals (HAIR/SKIN/NAILS PO) Take 1 tablet by mouth daily.       . psyllium (METAMUCIL SMOOTH TEXTURE) 28 % packet Take 1 packet by mouth  2 (two) times daily.      Marland Kitchen warfarin (COUMADIN) 5 MG tablet Take 1 tablet by mouth on Tuesday, Thursday, and Sat. Then take 1 & 1/2 tablets on Mon, Wed, Fri, and Sun  45 tablet  3   No current facility-administered medications for this visit.    Physical Exam: BP 129/85  Pulse 74  Resp 16  Ht 5' 3"  (1.6 m)  Wt 109 lb (49.442 kg)  BMI 19.31 kg/m2  SpO2 95%  General appearance: alert, cooperative, appears stated age and no distress Neurologic: intact Heart: irregularly irregular rhythm Lungs: clear to auscultation bilaterally, there is no wheezing on physical exam Abdomen: soft, non-tender; bowel sounds normal; no masses,  no organomegaly Extremities: extremities normal, atraumatic, no cyanosis or edema and Homans sign is negative, no sign of DVT Wound: Right chest incisions are well-healed  He has no cervical or supraclavicular adenopathy no axillary adenopathy  Diagnostic Studies & Laboratory data:         Recent Radiology Findings: Ct Chest Wo Contrast  02/27/2013   CLINICAL DATA:  Lung  cancer post surgery January 2013. Six month followup.  EXAM: CT CHEST WITHOUT CONTRAST  TECHNIQUE: Multidetector CT imaging of the chest was performed following the standard protocol without IV contrast.  COMPARISON:  01/23/2013 and 01/18/2012.  FINDINGS: Post right upper lobectomy. Postoperative changes for the most part appear similar to prior exam with the exception of increased thickening of the inferior aspect of the right fissure (series 602, image 47). Attention to this on follow up.  Stable appearance of anterior right lung pleural-based nodularity (series 4, image 41). Stable appearance of nodularity adjacent to left heart border (series 4, image 35). Stable appearance of minimal nodularity anterior left upper lobe (series 4, image 27).  Goiter with substernal extension greater on the right once again noted.  Stable normal-sized lymph nodes mediastinal and hilar region.  Dilated ascending thoracic aorta measuring up to 3.9 cm unchanged.  Mild coronary artery calcifications. Heart is slightly enlarged. No pericardial effusion.  No bony destructive lesion. Degenerative changes throughout the lumbar junction.  No obvious abnormality visualized upper abdominal structures. Lack the contrast somewhat limits evaluation.  IMPRESSION: Post right upper lobectomy. Postoperative changes for the most part appear similar to prior exam with the exception of increased thickening of the inferior aspect of the right fissure (series 602, image 47). Attention to this on follow up.  Remainder of findings appear similar to prior exam as detailed above.  This is a call report.   Electronically Signed   By: Chauncey Cruel M.D.   On: 02/27/2013 15:59    Recent Labs: Lab Results  Component Value Date   WBC 10.9* 06/05/2011   HGB 12.8 06/05/2011   HCT 38.5 06/05/2011   PLT 278 06/05/2011   GLUCOSE 127* 06/05/2011   ALT 17 06/02/2011   AST 28 06/02/2011   NA 138 06/05/2011   K 4.2 06/05/2011   CL 102 06/05/2011   CREATININE 0.64  07/17/2012   BUN 12 07/17/2012   CO2 27 06/05/2011   INR 2.1 02/18/2013      Assessment / Plan:   No evidence of recurrent Lung CA on exam or CT of Chest today now about 18  year following surgical resection of the right upper lobe for EGRF positive well-differentiated adenocarcinoma 2.2 cm in size   I plan to see her back in 91month with a followup  CT  Chest   EGrace IsaacMD 02/27/2013 5:43  PM

## 2013-03-04 ENCOUNTER — Ambulatory Visit (INDEPENDENT_AMBULATORY_CARE_PROVIDER_SITE_OTHER): Payer: Medicare Other | Admitting: *Deleted

## 2013-03-04 DIAGNOSIS — I4891 Unspecified atrial fibrillation: Secondary | ICD-10-CM

## 2013-03-04 DIAGNOSIS — Z7901 Long term (current) use of anticoagulants: Secondary | ICD-10-CM

## 2013-03-31 ENCOUNTER — Ambulatory Visit (INDEPENDENT_AMBULATORY_CARE_PROVIDER_SITE_OTHER): Payer: Medicare Other | Admitting: *Deleted

## 2013-03-31 ENCOUNTER — Encounter: Payer: Self-pay | Admitting: Adult Health

## 2013-03-31 ENCOUNTER — Ambulatory Visit (INDEPENDENT_AMBULATORY_CARE_PROVIDER_SITE_OTHER): Payer: Medicare Other | Admitting: Adult Health

## 2013-03-31 VITALS — BP 124/82 | HR 75 | Ht 63.0 in | Wt 114.0 lb

## 2013-03-31 DIAGNOSIS — I4891 Unspecified atrial fibrillation: Secondary | ICD-10-CM

## 2013-03-31 DIAGNOSIS — Z7901 Long term (current) use of anticoagulants: Secondary | ICD-10-CM

## 2013-03-31 LAB — POCT INR: INR: 2.5

## 2013-03-31 NOTE — Patient Instructions (Signed)
Your physician recommends that you schedule a follow-up appointment in: 1 month with Dr Domenic Polite in [facility].  Your physician has requested that you have an echocardiogram. Echocardiography is a painless test that uses sound waves to create images of your heart. It provides your doctor with information about the size and shape of your heart and how well your heart's chambers and valves are working. This procedure takes approximately one hour. There are no restrictions for this procedure.

## 2013-03-31 NOTE — Assessment & Plan Note (Addendum)
Heart rate is well controlled on current medications.  No changes are made to her medication regimen. BP is well controlled. Echocardiogram will be ordered for evaluation of LV fx, LA and aorta size.

## 2013-03-31 NOTE — Progress Notes (Signed)
HPI: Natalie Carlson is a very pleasant 79 y/o patient of Dr. Domenic Polite that is normally seen in the [place] office, that we follow for ongoing assessment and management of atrial fib on chronic anticoagulation on coumadin. She comes today post [facility] ER visit where she was seen for bulging left neck vein on the left neck.  She states it was swollen up just above the clavicle. Mild pain with headache. She denied dizziness, or bleeding.    In ER of [place], she was evaluated with a CT scan of the neck. She states everything was normal. EKG showed atrial fibrillation, no ACS, CXR demonstrated cardiomegaly without pulmonary edema. Labs values Creat 0.83, LFTs WNL, Potassium 3.7. Vital signs were WNL.    She comes today without further complaints of neck swelling. She states the swelling went away on its own without treatment.  Allergies  Allergen Reactions  . Doxycycline Swelling    SEVERE FACIAL SWELLING AND BURNING  . Entocort Ec [Budesonide]     Patient saw her PCP, Dr. Eula Fried and he made Dr. Laural Golden aware that he had d/c patient taking Entocort due to the side effects she was experiencing. SEVERE FACIAL BURNING  . Neurontin [Gabapentin] Swelling    Per the patient she also has itching  . Vicodin [Hydrocodone-Acetaminophen] Rash    Rash,Redness, and itching  . Adhesive [Tape] Other (See Comments)    Took skin off  . Chocolate Other (See Comments)    Causes migraines  . Codeine     REACTION: nausea  . Morphine And Related Nausea Only  . Penicillins Other (See Comments)    Childhood allergy  . Prednisone     Itching   . Uncoded Nonscreenable Allergen Other (See Comments)    rasberry   headaches  . Naprosyn [Naproxen] Other (See Comments)    flush  . Tramadol Itching    Current Outpatient Prescriptions  Medication Sig Dispense Refill  . acetaminophen (TYLENOL) 650 MG CR tablet Take 650 mg by mouth every 8 (eight) hours as needed. For pain      . ALPRAZolam (XANAX) 0.5 MG tablet Take  0.5 mg by mouth at bedtime as needed. For sleep      . diclofenac sodium (VOLTAREN) 1 % GEL Apply 1 application topically 2 (two) times daily as needed. For neck pain      . diltiazem (CARDIZEM CD) 120 MG 24 hr capsule Take 1 capsule (120 mg total) by mouth daily.  30 capsule  6  . fish oil-omega-3 fatty acids 1000 MG capsule Take 1,200 g by mouth daily.       . mesalamine (APRISO) 0.375 G 24 hr capsule Take 375 mg by mouth daily.      . Multiple Vitamins-Minerals (HAIR/SKIN/NAILS PO) Take 1 tablet by mouth daily.       . psyllium (METAMUCIL SMOOTH TEXTURE) 28 % packet Take 1 packet by mouth 2 (two) times daily.      . ranitidine (ZANTAC) 150 MG capsule Take 150 mg by mouth 2 (two) times daily.      Marland Kitchen warfarin (COUMADIN) 5 MG tablet Take 1 tablet by mouth on Tuesday, Thursday, and Sat. Then take 1 & 1/2 tablets on Mon, Wed, Fri, and Sun  45 tablet  3   No current facility-administered medications for this visit.    Past Medical History  Diagnosis Date  . Hypothyroidism   . Cataract of both eyes   . Blood in stool   . Mucus in stool   .  Chronic diarrhea   . Hyperlipidemia     Takes Fish Oil daily;was on Zetia but taken off by medical MD over a yr ago  . Colitis   . History of colon polyps   . History of migraines   . Arthritis   . Chronic back pain   . Hemorrhoids   . Macular degeneration, dry   . Adenocarcinoma of lung     Stage  1A  EGFR positive  ALK-negative   . Atrial fibrillation   . Nephrolithiasis     1994 - passed on its on    Past Surgical History  Procedure Laterality Date  . Cataract extraction  2010/2011  . Bladder tack  1996  . Colonoscopy  3 YRS AGO  . Thyroid surgery  1961  . Colonoscopy  03/06/2011    Procedure: COLONOSCOPY;  Surgeon: Rogene Houston, MD;  [facility];  Service: Endoscopy;  Laterality: N/A;  7:30 am  . Tonsillectomy    . Esophagogastroduodenoscopy    . Cardioversion  11/02/09    x 2  . Video bronchoscopy  05/31/2011     Procedure: VIDEO BRONCHOSCOPY;  Surgeon: Grace Isaac, MD;  Location: [place];  Service: Thoracic;  Laterality: N/A;  . Flexible sigmoidoscopy N/A 10/04/2012    Procedure: FLEXIBLE SIGMOIDOSCOPY;  Surgeon: Rogene Houston, MD;  [facility];  Service: Endoscopy;  Laterality: N/A;  11:00-moved to 1015 Ann to notify pt    ROS: Review of systems complete and found to be negative unless listed above  PHYSICAL EXAM BP 124/82  Pulse 75  Ht 5' 3"  (1.6 m)  Wt 114 lb (51.71 kg)  BMI 20.20 kg/m2  General: Well developed, well nourished, in no acute distress Head: Eyes PERRLA, No xanthomas.   Normal cephalic and atramatic. Neck veins were non-distended bilaterally. No swelling.  Lungs: Clear bilaterally to auscultation and percussion. Heart: HIRR S1 S2, without MRG.  Pulses are 2+ & equal.            No carotid bruit. No JVD.  No abdominal bruits. No femoral bruits. Abdomen: Bowel sounds are positive, abdomen soft and non-tender without masses or                  Hernia's noted. Msk:  Back normal, normal gait. Normal strength and tone for age. Extremities: No clubbing, cyanosis or edema.  DP +1 Neuro: Alert and oriented X 3. Psych:  Good affect, responds appropriately  EKG: From [facility].  Atrial fib. Rate of 79 bpm.   ASSESSMENT AND PLAN

## 2013-03-31 NOTE — Assessment & Plan Note (Signed)
INR today is 2.5. Coumadin is dosed in our office today.

## 2013-03-31 NOTE — Progress Notes (Deleted)
Name: Natalie Carlson    DOB: 1929-06-27  Age: 79 y.o.  MR#: [id number]       PCP:  [name], MD      Insurance: Payor: MEDICARE / Plan: MEDICARE PART A AND B / Product Type: *No Product type* /   CC:   No chief complaint on file.   VS Filed Vitals:   03/31/13 1408  BP: 124/82  Pulse: 75  Height: 5' 3"  (1.6 m)  Weight: 114 lb (51.71 kg)    Weights Current Weight  03/31/13 114 lb (51.71 kg)  02/27/13 109 lb (49.442 kg)  02/14/13 109 lb (49.442 kg)    Blood Pressure  BP Readings from Last 3 Encounters:  03/31/13 124/82  02/27/13 129/85  02/14/13 108/72     Admit date:  (Not on file) Last encounter with RMR:  Visit date not found   Allergy Doxycycline; Entocort ec; Neurontin; Vicodin; Adhesive; Chocolate; Codeine; Morphine and related; Penicillins; Prednisone; Uncoded nonscreenable allergen; Naprosyn; and Tramadol  Current Outpatient Prescriptions  Medication Sig Dispense Refill  . acetaminophen (TYLENOL) 650 MG CR tablet Take 650 mg by mouth every 8 (eight) hours as needed. For pain      . ALPRAZolam (XANAX) 0.5 MG tablet Take 0.5 mg by mouth at bedtime as needed. For sleep      . diclofenac sodium (VOLTAREN) 1 % GEL Apply 1 application topically 2 (two) times daily as needed. For neck pain      . diltiazem (CARDIZEM CD) 120 MG 24 hr capsule Take 1 capsule (120 mg total) by mouth daily.  30 capsule  6  . fish oil-omega-3 fatty acids 1000 MG capsule Take 1,200 g by mouth daily.       . mesalamine (APRISO) 0.375 G 24 hr capsule Take 375 mg by mouth daily.      . Multiple Vitamins-Minerals (HAIR/SKIN/NAILS PO) Take 1 tablet by mouth daily.       . psyllium (METAMUCIL SMOOTH TEXTURE) 28 % packet Take 1 packet by mouth 2 (two) times daily.      . ranitidine (ZANTAC) 150 MG capsule Take 150 mg by mouth 2 (two) times daily.      Marland Kitchen warfarin (COUMADIN) 5 MG tablet Take 1 tablet by mouth on Tuesday, Thursday, and Sat. Then take 1 & 1/2 tablets on Mon, Wed, Fri, and Sun  45 tablet  3    No current facility-administered medications for this visit.    Discontinued Meds:    Medications Discontinued During This Encounter  Medication Reason  . Mesalamine (DELZICOL) 400 MG CPDR DR capsule Error    Patient Active Problem List   Diagnosis Date Noted  . Rectosigmoiditis 11/25/2012  . Diarrhea 09/25/2012  . Rectal discharge 09/09/2012  . RLQ abdominal pain 09/09/2012  . Adenocarcinoma of lung   . Long term current use of anticoagulant 07/29/2010  . Atrial fibrillation 07/29/2009    LABS    Component Value Date/Time   NA 138 06/05/2011 0445   NA 137 06/04/2011 0419   NA 135 06/03/2011 0400   K 4.2 06/05/2011 0445   K 3.4* 06/04/2011 0419   K 3.0* 06/03/2011 0400   CL 102 06/05/2011 0445   CL 102 06/04/2011 0419   CL 99 06/03/2011 0400   CO2 27 06/05/2011 0445   CO2 29 06/04/2011 0419   CO2 28 06/03/2011 0400   GLUCOSE 127* 06/05/2011 0445   GLUCOSE 107* 06/04/2011 0419   GLUCOSE 123* 06/03/2011 0400   BUN 12  07/17/2012 0950   BUN 16 11/23/2011 0523   BUN 12 06/05/2011 0445   CREATININE 0.64 07/17/2012 0950   CREATININE 0.80 11/23/2011 0523   CREATININE 0.54 06/05/2011 0445   CREATININE 0.57 06/04/2011 0419   CREATININE 0.45* 06/03/2011 0400   CALCIUM 8.7 06/05/2011 0445   CALCIUM 8.2* 06/04/2011 0419   CALCIUM 8.2* 06/03/2011 0400   GFRNONAA 87* 06/05/2011 0445   GFRNONAA 86* 06/04/2011 0419   GFRNONAA >90 06/03/2011 0400   GFRAA >90 06/05/2011 0445   GFRAA >90 06/04/2011 0419   GFRAA >90 06/03/2011 0400   CMP     Component Value Date/Time   NA 138 06/05/2011 0445   K 4.2 06/05/2011 0445   CL 102 06/05/2011 0445   CO2 27 06/05/2011 0445   GLUCOSE 127* 06/05/2011 0445   BUN 12 07/17/2012 0950   CREATININE 0.64 07/17/2012 0950   CREATININE 0.54 06/05/2011 0445   CALCIUM 8.7 06/05/2011 0445   PROT 5.4* 06/02/2011 0400   ALBUMIN 2.5* 06/02/2011 0400   AST 28 06/02/2011 0400   ALT 17 06/02/2011 0400   ALKPHOS 58 06/02/2011 0400   BILITOT 0.5 06/02/2011 0400   GFRNONAA 87* 06/05/2011 0445    GFRAA >90 06/05/2011 0445       Component Value Date/Time   WBC 10.9* 06/05/2011 0445   WBC 9.4 06/04/2011 0419   WBC 12.2* 06/03/2011 0400   HGB 12.8 06/05/2011 0445   HGB 11.2* 06/04/2011 0419   HGB 11.8* 06/03/2011 0400   HCT 38.5 06/05/2011 0445   HCT 33.2* 06/04/2011 0419   HCT 34.5* 06/03/2011 0400   MCV 90.0 06/05/2011 0445   MCV 89.5 06/04/2011 0419   MCV 88.2 06/03/2011 0400    Lipid Panel  No results found for this basename: chol, trig, hdl, cholhdl, vldl, ldlcalc    ABG    Component Value Date/Time   PHART 7.388 06/01/2011 0420   PCO2ART 38.4 06/01/2011 0420   PO2ART 99.0 06/01/2011 0420   HCO3 23.2 06/01/2011 0420   TCO2 24 06/01/2011 0420   ACIDBASEDEF 2.0 06/01/2011 0420   O2SAT 98.0 06/01/2011 0420     No results found for this basename: TSH   BNP (last 3 results) No results found for this basename: PROBNP,  in the last 8760 hours Cardiac Panel (last 3 results) No results found for this basename: CKTOTAL, CKMB, TROPONINI, RELINDX,  in the last 72 hours  Iron/TIBC/Ferritin No results found for this basename: iron, tibc, ferritin     EKG Orders placed during the hospital encounter of 05/31/11  . EKG     Prior Assessment and Plan Problem List as of 03/31/2013     Cardiovascular and Mediastinum   Atrial fibrillation   Last Assessment & Plan   03/20/2012 Office Visit Written 03/20/2012 10:40 AM by Satira Sark, MD     Permanent, now managed with strategy of heart rate control and anticoagulation. She has been doing well. No changes made today.      Respiratory   Adenocarcinoma of lung   Last Assessment & Plan   03/20/2012 Office Visit Written 03/20/2012 10:41 AM by Satira Sark, MD     Patient doing quite well, had followup with Dr. Servando Snare earlier in the year.      Digestive   Rectosigmoiditis     Other   Long term current use of anticoagulant   Last Assessment & Plan   10/03/2011 Office Visit Written 10/03/2011 12:28 PM by Ezra Sites, MD  Stable on Coumadin. Patient is now on an every 6 week schedule.    Rectal discharge   RLQ abdominal pain   Diarrhea       Imaging: No results found.

## 2013-04-01 ENCOUNTER — Encounter: Payer: Self-pay | Admitting: Adult Health

## 2013-04-03 ENCOUNTER — Ambulatory Visit: Payer: Medicare Other | Admitting: Cardiology

## 2013-04-07 ENCOUNTER — Other Ambulatory Visit: Payer: Self-pay

## 2013-04-07 ENCOUNTER — Ambulatory Visit: Payer: Medicare Other | Admitting: Cardiology

## 2013-04-07 ENCOUNTER — Other Ambulatory Visit (INDEPENDENT_AMBULATORY_CARE_PROVIDER_SITE_OTHER): Payer: Medicare Other

## 2013-04-07 DIAGNOSIS — I059 Rheumatic mitral valve disease, unspecified: Secondary | ICD-10-CM

## 2013-04-07 DIAGNOSIS — I4891 Unspecified atrial fibrillation: Secondary | ICD-10-CM

## 2013-04-07 DIAGNOSIS — I359 Nonrheumatic aortic valve disorder, unspecified: Secondary | ICD-10-CM

## 2013-04-07 NOTE — Progress Notes (Signed)
Noted! Thank you

## 2013-04-29 ENCOUNTER — Ambulatory Visit (INDEPENDENT_AMBULATORY_CARE_PROVIDER_SITE_OTHER): Payer: Medicare Other | Admitting: *Deleted

## 2013-04-29 DIAGNOSIS — I4891 Unspecified atrial fibrillation: Secondary | ICD-10-CM

## 2013-04-29 DIAGNOSIS — Z7901 Long term (current) use of anticoagulants: Secondary | ICD-10-CM

## 2013-04-29 LAB — POCT INR: INR: 2.6

## 2013-05-13 ENCOUNTER — Telehealth: Payer: Self-pay | Admitting: Cardiology

## 2013-05-13 NOTE — Telephone Encounter (Signed)
Natalie Carlson called in regards to being referred to a Vascular doctor.

## 2013-05-14 ENCOUNTER — Ambulatory Visit (INDEPENDENT_AMBULATORY_CARE_PROVIDER_SITE_OTHER): Payer: Medicare Other | Admitting: Cardiology

## 2013-05-14 ENCOUNTER — Encounter: Payer: Self-pay | Admitting: Cardiology

## 2013-05-14 VITALS — BP 123/75 | HR 84 | Ht 63.0 in | Wt 114.0 lb

## 2013-05-14 DIAGNOSIS — I4891 Unspecified atrial fibrillation: Secondary | ICD-10-CM

## 2013-05-14 DIAGNOSIS — I059 Rheumatic mitral valve disease, unspecified: Secondary | ICD-10-CM

## 2013-05-14 DIAGNOSIS — M542 Cervicalgia: Secondary | ICD-10-CM

## 2013-05-14 DIAGNOSIS — I340 Nonrheumatic mitral (valve) insufficiency: Secondary | ICD-10-CM

## 2013-05-14 MED ORDER — DILTIAZEM HCL ER COATED BEADS 180 MG PO CP24: 180.0000 mg | ORAL_CAPSULE | Freq: Every day | ORAL | Status: DC

## 2013-05-14 NOTE — Progress Notes (Signed)
Clinical Summary Natalie Carlson is a 79 y.o.female former patient of Dr Dannielle Burn, last seen by NP Purcell Nails. This is our first visit together. She was seen for the following medical problems.   1. Afib - 2 prior failed DCCV, also previously on flecanide without control - on coumadin and diltiazem - denies any palpitations  2. Hyperlipidemia - several statins have caused myalgias, currently not on any therapy  3. Moderate MR - noted on most recent echo. She denies any significant DOE, orthopnea, PND or LE edema  4. Neck pulsation - reports intermittent pulsating discomfort in her left neck. She was seen previously in the ER for this with a neck CT which showed no masses and normal vasculature - reports 2 more repeat episodes.    Past Medical History  Diagnosis Date  . Hypothyroidism   . Cataract of both eyes   . Blood in stool   . Mucus in stool   . Chronic diarrhea   . Hyperlipidemia     Takes Fish Oil daily;was on Zetia but taken off by medical MD over a yr ago  . Colitis   . History of colon polyps   . History of migraines   . Arthritis   . Chronic back pain   . Hemorrhoids   . Macular degeneration, dry   . Adenocarcinoma of lung     Stage  1A  EGFR positive  ALK-negative   . Atrial fibrillation   . Nephrolithiasis     1994 - passed on its on     Allergies  Allergen Reactions  . Doxycycline Swelling    SEVERE FACIAL SWELLING AND BURNING  . Entocort Ec [Budesonide]     Patient saw her PCP, Dr. Eula Fried and he made Dr. Laural Golden aware that he had d/c patient taking Entocort due to the side effects she was experiencing. SEVERE FACIAL BURNING  . Neurontin [Gabapentin] Swelling    Per the patient she also has itching  . Vicodin [Hydrocodone-Acetaminophen] Rash    Rash,Redness, and itching  . Adhesive [Tape] Other (See Comments)    Took skin off  . Chocolate Other (See Comments)    Causes migraines  . Codeine     REACTION: nausea  . Morphine And Related Nausea Only   . Penicillins Other (See Comments)    Childhood allergy  . Prednisone     Itching   . Uncoded Nonscreenable Allergen Other (See Comments)    rasberry   headaches  . Naprosyn [Naproxen] Other (See Comments)    flush  . Tramadol Itching     Current Outpatient Prescriptions  Medication Sig Dispense Refill  . acetaminophen (TYLENOL) 650 MG CR tablet Take 650 mg by mouth every 8 (eight) hours as needed. For pain      . ALPRAZolam (XANAX) 0.5 MG tablet Take 0.5 mg by mouth at bedtime as needed. For sleep      . diclofenac sodium (VOLTAREN) 1 % GEL Apply 1 application topically 2 (two) times daily as needed. For neck pain      . diltiazem (CARDIZEM CD) 120 MG 24 hr capsule Take 1 capsule (120 mg total) by mouth daily.  30 capsule  6  . fish oil-omega-3 fatty acids 1000 MG capsule Take 1,200 g by mouth daily.       . mesalamine (APRISO) 0.375 G 24 hr capsule Take 375 mg by mouth daily.      . Multiple Vitamins-Minerals (HAIR/SKIN/NAILS PO) Take 1 tablet by mouth daily.       Marland Kitchen  psyllium (METAMUCIL SMOOTH TEXTURE) 28 % packet Take 1 packet by mouth 2 (two) times daily.      . ranitidine (ZANTAC) 150 MG capsule Take 150 mg by mouth 2 (two) times daily.      Marland Kitchen warfarin (COUMADIN) 5 MG tablet Take 1 tablet by mouth on Tuesday, Thursday, and Sat. Then take 1 & 1/2 tablets on Mon, Wed, Fri, and Sun  45 tablet  3   No current facility-administered medications for this visit.     Past Surgical History  Procedure Laterality Date  . Cataract extraction  2010/2011  . Bladder tack  1996  . Colonoscopy  3 YRS AGO  . Thyroid surgery  1961  . Colonoscopy  03/06/2011    Procedure: COLONOSCOPY;  Surgeon: Rogene Houston, MD;  [facility];  Service: Endoscopy;  Laterality: N/A;  7:30 am  . Tonsillectomy    . Esophagogastroduodenoscopy    . Cardioversion  11/02/09    x 2  . Video bronchoscopy  05/31/2011    Procedure: VIDEO BRONCHOSCOPY;  Surgeon: Grace Isaac, MD;  Location: [place];   Service: Thoracic;  Laterality: N/A;  . Flexible sigmoidoscopy N/A 10/04/2012    Procedure: FLEXIBLE SIGMOIDOSCOPY;  Surgeon: Rogene Houston, MD;  [facility];  Service: Endoscopy;  Laterality: N/A;  11:00-moved to 1015 Ann to notify pt     Allergies  Allergen Reactions  . Doxycycline Swelling    SEVERE FACIAL SWELLING AND BURNING  . Entocort Ec [Budesonide]     Patient saw her PCP, Dr. Eula Fried and he made Dr. Laural Golden aware that he had d/c patient taking Entocort due to the side effects she was experiencing. SEVERE FACIAL BURNING  . Neurontin [Gabapentin] Swelling    Per the patient she also has itching  . Vicodin [Hydrocodone-Acetaminophen] Rash    Rash,Redness, and itching  . Adhesive [Tape] Other (See Comments)    Took skin off  . Chocolate Other (See Comments)    Causes migraines  . Codeine     REACTION: nausea  . Morphine And Related Nausea Only  . Penicillins Other (See Comments)    Childhood allergy  . Prednisone     Itching   . Uncoded Nonscreenable Allergen Other (See Comments)    rasberry   headaches  . Naprosyn [Naproxen] Other (See Comments)    flush  . Tramadol Itching      Family History  Problem Relation Age of Onset  . Stroke Mother   . Stroke Father   . Stroke Other     two siblings  . Heart disease Other     sibling  . Liver disease Paternal Grandmother   . Anesthesia problems Sister   . Hypotension Neg Hx   . Malignant hyperthermia Neg Hx   . Pseudochol deficiency Neg Hx      Social History Natalie Carlson reports that she has never smoked. She has never used smokeless tobacco. Natalie Carlson reports that she does not drink alcohol.   Review of Systems CONSTITUTIONAL: No weight loss, fever, chills, weakness or fatigue.  HEENT: Eyes: No visual loss, blurred vision, double vision or yellow sclerae.No hearing loss, sneezing, congestion, runny nose or sore throat.  SKIN: No rash or itching.  CARDIOVASCULAR: per HPI RESPIRATORY: No  shortness of breath, cough or sputum.  GASTROINTESTINAL: No anorexia, nausea, vomiting or diarrhea. No abdominal pain or blood.  GENITOURINARY: No burning on urination, no polyuria NEUROLOGICAL: No headache, dizziness, syncope, paralysis, ataxia, numbness or  tingling in the extremities. No change in bowel or bladder control.  MUSCULOSKELETAL: neck pain LYMPHATICS: No enlarged nodes. No history of splenectomy.  PSYCHIATRIC: No history of depression or anxiety.  ENDOCRINOLOGIC: No reports of sweating, cold or heat intolerance. No polyuria or polydipsia.  Marland Kitchen   Physical Examination p84 bp 123/75 Wt 114 lbs BMI 20 Gen: resting comfortably, no acute distress HEENT: no scleral icterus, pupils equal round and reactive, no palptable cervical adenopathy,  CV: irreg, no m/r/g, no JVD, no carotid bruits Resp: Clear to auscultation bilaterally GI: abdomen is soft, non-tender, non-distended, normal bowel sounds, no hepatosplenomegaly MSK: extremities are warm, no edema.  Skin: warm, no rash Neuro:  no focal deficits Psych: appropriate affect   Diagnostic Studies 03/2013 Echo LVEF 60-65%, restrictive diastolic dysfunction, mild AI, moderate MR, moderately enlarged RV, mod TR, PASP 35   Assessment and Plan  1. Afib - continue current medications  2. Hyperlipidemia - not currently on therapy, multiple statins per notes have caused myalgias.   3. Moderate MR - no current symptoms - Normal LVEF with normal dimensions (LVIDs reported at 2.3 cm). - continue routine echo surveillance  4. Neck pain - unclear etiology, describes pain and pulsating sensation in left neck at times. Recent CT shows no structural abnormalities. Wonder if she could be having rapid afib causing a prominent irregular pulse in her neck. She does not want to wear a holter monitor because the stickers hurt her skin, will try empirically increasing her cardizem and following symptoms.    Patient will follow up with her  regular cardiologist Johnny Bridge at the end of the month.   Arnoldo Lenis, M.D., F.A.C.C.

## 2013-05-14 NOTE — Patient Instructions (Signed)
Your physician recommended that you keep your follow up appointment on June 13, 2013 with Dr. Domenic Polite.  Your physician has recommended you make the following change in your medication:  Increase Cardizem CD to 180 MG once daily  Continue all other medications the same.

## 2013-05-14 NOTE — Telephone Encounter (Signed)
Patient informed nurse that she wanted a referral to a vein specialist about her left bulging neck vein problem that has continued to bother her. Nurse advised patient that no referral had been requested and she would need further evaluation before referral could be made. Patient requesting to be seen for this problem. Appointment scheduled today with Dr. Harl Bowie in [place] absence and patient aware.

## 2013-05-19 ENCOUNTER — Ambulatory Visit: Payer: Medicare Other | Admitting: Cardiology

## 2013-05-20 ENCOUNTER — Ambulatory Visit (INDEPENDENT_AMBULATORY_CARE_PROVIDER_SITE_OTHER): Payer: Medicare Other | Admitting: *Deleted

## 2013-05-20 DIAGNOSIS — I4891 Unspecified atrial fibrillation: Secondary | ICD-10-CM

## 2013-05-20 DIAGNOSIS — Z7901 Long term (current) use of anticoagulants: Secondary | ICD-10-CM

## 2013-05-20 LAB — POCT INR: INR: 2.4

## 2013-05-30 ENCOUNTER — Other Ambulatory Visit (HOSPITAL_COMMUNITY): Payer: Self-pay | Admitting: Cardiology

## 2013-06-13 ENCOUNTER — Ambulatory Visit (INDEPENDENT_AMBULATORY_CARE_PROVIDER_SITE_OTHER): Payer: Medicare Other | Admitting: *Deleted

## 2013-06-13 ENCOUNTER — Ambulatory Visit (INDEPENDENT_AMBULATORY_CARE_PROVIDER_SITE_OTHER): Payer: Medicare Other | Admitting: Cardiology

## 2013-06-13 ENCOUNTER — Encounter: Payer: Self-pay | Admitting: Cardiology

## 2013-06-13 VITALS — BP 118/76 | HR 84 | Ht 63.0 in | Wt 114.2 lb

## 2013-06-13 DIAGNOSIS — R0989 Other specified symptoms and signs involving the circulatory and respiratory systems: Secondary | ICD-10-CM

## 2013-06-13 DIAGNOSIS — I4891 Unspecified atrial fibrillation: Secondary | ICD-10-CM

## 2013-06-13 DIAGNOSIS — Z7901 Long term (current) use of anticoagulants: Secondary | ICD-10-CM

## 2013-06-13 DIAGNOSIS — Z5181 Encounter for therapeutic drug level monitoring: Secondary | ICD-10-CM

## 2013-06-13 LAB — POCT INR: INR: 3.9

## 2013-06-13 NOTE — Patient Instructions (Signed)
Your physician has requested that you have a carotid duplex. This test is an ultrasound of the carotid arteries in your neck. It looks at blood flow through these arteries that supply the brain with blood. Allow one hour for this exam. There are no restrictions or special instructions. Office will contact with results via phone or letter.   Continue all current medications. Your physician wants you to follow up in: 8 months.  You will receive a reminder letter in the mail one-two months in advance.  If you don't receive a letter, please call our office to schedule the follow up appointment

## 2013-06-13 NOTE — Progress Notes (Signed)
Clinical Summary Ms. Corkern is an 80 y.o.female that I last saw in November 2013. She has had interval followup with Ms. Lawrence NP, and most recently Dr. Harl Bowie. She has had a number of studies as outlined below, her main concern being a pain in the left side of her neck that has happened sporadically a few times, also a feeling of prominence of her vessels on that side of her neck.  Echocardiogram in November 2014 revealed LVEF 47-42% with diastolic dysfunction, mild aortic regurgitation, moderate mitral regurgitation, moderate left atrial enlargement, moderately dilated right ventricle, moderately dilated right atrium, moderate tricuspid regurgitation, PASP 35 mmHg.  CT examination of the neck in November 2014 indicated no acute findings. Chest CT from October 2014 showed post right upper lobectomy changes, stable anterior right lung pleural-based nodularity, goiter with substernal extension greater on the right, normal sized lymph nodes in the mediastinum and hilar region, ascending thoracic aorta measuring 3.9 cm, mild coronary artery calcifications.  She reports fairly good control of her atrial fibrillation, tolerating the present dose of diltiazem CD. INR was supratherapeutic today, had a recent course of antibiotics. She denies any major bleeding problems.   Allergies  Allergen Reactions  . Doxycycline Swelling    SEVERE FACIAL SWELLING AND BURNING  . Entocort Ec [Budesonide]     Patient saw her PCP, Dr. Eula Fried and he made Dr. Laural Golden aware that he had d/c patient taking Entocort due to the side effects she was experiencing. SEVERE FACIAL BURNING  . Neurontin [Gabapentin] Swelling    Per the patient she also has itching  . Vicodin [Hydrocodone-Acetaminophen] Rash    Rash,Redness, and itching  . Adhesive [Tape] Other (See Comments)    Took skin off  . Chocolate Other (See Comments)    Causes migraines  . Codeine     REACTION: nausea  . Morphine And Related Nausea Only  .  Penicillins Other (See Comments)    Childhood allergy  . Prednisone     Itching   . Uncoded Nonscreenable Allergen Other (See Comments)    rasberry   headaches  . Naprosyn [Naproxen] Other (See Comments)    flush  . Tramadol Itching    Current Outpatient Prescriptions  Medication Sig Dispense Refill  . acetaminophen (TYLENOL) 650 MG CR tablet Take 650 mg by mouth every 8 (eight) hours as needed. For pain      . ALPRAZolam (XANAX) 0.5 MG tablet Take 0.5 mg by mouth at bedtime as needed. For sleep      . diclofenac sodium (VOLTAREN) 1 % GEL Apply 1 application topically 2 (two) times daily as needed. For neck pain      . diltiazem (CARDIZEM CD) 180 MG 24 hr capsule Take 1 capsule (180 mg total) by mouth daily.  30 capsule  6  . fish oil-omega-3 fatty acids 1000 MG capsule Take 1,200 g by mouth daily.       . mesalamine (APRISO) 0.375 G 24 hr capsule Take 375 mg by mouth daily.      . Multiple Vitamins-Minerals (HAIR/SKIN/NAILS PO) Take 1 tablet by mouth daily.       Marland Kitchen warfarin (COUMADIN) 5 MG tablet Take 1 tablet by mouth on Tuesday, Thursday, and Sat. Then take 1 & 1/2 tablets on Mon, Wed, Fri, and Sun  45 tablet  3   No current facility-administered medications for this visit.    Past Medical History  Diagnosis Date  . Hypothyroidism   . Cataract of both eyes   .  Blood in stool   . Mucus in stool   . Chronic diarrhea   . Hyperlipidemia     Takes Fish Oil daily;was on Zetia but taken off by medical MD over a yr ago  . Colitis   . History of colon polyps   . History of migraines   . Arthritis   . Chronic back pain   . Hemorrhoids   . Macular degeneration, dry   . Adenocarcinoma of lung     Stage  1A  EGFR positive  ALK-negative   . Atrial fibrillation   . Nephrolithiasis     1994 - passed on its on    Social History Ms. Base reports that she has never smoked. She has never used smokeless tobacco. Ms. Roselli reports that she does not drink alcohol.  Review of  Systems No speech deficits or focal motor weakness, no headaches, no pressures breath. Otherwise negative.   Physical Examination Filed Vitals:   06/13/13 0925  BP: 118/76  Pulse: 84   Filed Weights   06/13/13 0925  Weight: 114 lb 4 oz (51.823 kg)    No acute distress.  HEENT: Conjunctiva and lids normal, oropharynx clear.  Neck: Supple, prominent superficial veins on the left side of the base of the neck, also strap muscles of the neck easily palpable. No pain in this area. Questionable carotid bruit  Lungs: Diminished but cleaar to auscultation, nonlabored breathing at rest.  Cardiac: Irregularly irregular, no S3 or significant systolic murmur, no pericardial rub.  Abdomen: Soft, nontender, bowel sounds present.  Extremities: No pitting edema, distal pulses 2+.  Skin: Warm and dry.  Musculoskeletal: No kyphosis.  Neuropsychiatric: Alert and oriented x3, affect grossly appropriate.   Problem List and Plan   Carotid bruit Questionable on left side. Imaging studies of the neck noted and fairly unremarkable, although carotid vessels have not been formally assessed. Will arrange carotid Doppler. Findings by chest CT noted, followed by Dr. Servando Snare.  Atrial fibrillation Continue current medications including Cardizem CD and Coumadin. She follows in the Coumadin clinic.    Satira Sark, M.D., F.A.C.C.

## 2013-06-13 NOTE — Assessment & Plan Note (Signed)
Continue current medications including Cardizem CD and Coumadin. She follows in the Coumadin clinic.

## 2013-06-13 NOTE — Assessment & Plan Note (Signed)
Questionable on left side. Imaging studies of the neck noted and fairly unremarkable, although carotid vessels have not been formally assessed. Will arrange carotid Doppler. Findings by chest CT noted, followed by Dr. Servando Snare.

## 2013-06-17 ENCOUNTER — Other Ambulatory Visit: Payer: Self-pay | Admitting: *Deleted

## 2013-06-17 MED ORDER — WARFARIN SODIUM 5 MG PO TABS: ORAL_TABLET | ORAL | Status: DC

## 2013-06-18 ENCOUNTER — Encounter (INDEPENDENT_AMBULATORY_CARE_PROVIDER_SITE_OTHER): Payer: Medicare Other

## 2013-06-18 DIAGNOSIS — I6529 Occlusion and stenosis of unspecified carotid artery: Secondary | ICD-10-CM

## 2013-06-18 DIAGNOSIS — R0989 Other specified symptoms and signs involving the circulatory and respiratory systems: Secondary | ICD-10-CM

## 2013-06-19 ENCOUNTER — Telehealth: Payer: Self-pay | Admitting: *Deleted

## 2013-06-19 NOTE — Telephone Encounter (Signed)
Patient informed. 

## 2013-06-19 NOTE — Telephone Encounter (Signed)
Message copied by Merlene Laughter on Thu Jun 19, 2013  3:44 PM ------      Message from: MCDOWELL, Aloha Gell      Created: Thu Jun 19, 2013  1:23 PM       Reviewed. Please let her know that there is evidence of only mild atherosclerotic plaque in her carotids, not something that would be giving her symptoms. This is overall reassuring. ------

## 2013-07-22 ENCOUNTER — Ambulatory Visit (INDEPENDENT_AMBULATORY_CARE_PROVIDER_SITE_OTHER): Payer: Medicare Other | Admitting: *Deleted

## 2013-07-22 DIAGNOSIS — Z7901 Long term (current) use of anticoagulants: Secondary | ICD-10-CM

## 2013-07-22 DIAGNOSIS — I4891 Unspecified atrial fibrillation: Secondary | ICD-10-CM

## 2013-07-22 DIAGNOSIS — Z5181 Encounter for therapeutic drug level monitoring: Secondary | ICD-10-CM

## 2013-07-22 LAB — POCT INR: INR: 2.6

## 2013-08-18 ENCOUNTER — Ambulatory Visit (INDEPENDENT_AMBULATORY_CARE_PROVIDER_SITE_OTHER): Payer: Medicare Other | Admitting: Internal Medicine

## 2013-08-19 ENCOUNTER — Ambulatory Visit (INDEPENDENT_AMBULATORY_CARE_PROVIDER_SITE_OTHER): Payer: Medicare Other | Admitting: *Deleted

## 2013-08-19 DIAGNOSIS — Z7901 Long term (current) use of anticoagulants: Secondary | ICD-10-CM

## 2013-08-19 DIAGNOSIS — Z5181 Encounter for therapeutic drug level monitoring: Secondary | ICD-10-CM

## 2013-08-19 DIAGNOSIS — I4891 Unspecified atrial fibrillation: Secondary | ICD-10-CM

## 2013-08-19 LAB — POCT INR: INR: 2.5

## 2013-08-20 ENCOUNTER — Other Ambulatory Visit: Payer: Self-pay | Admitting: *Deleted

## 2013-08-20 DIAGNOSIS — C3490 Malignant neoplasm of unspecified part of unspecified bronchus or lung: Secondary | ICD-10-CM

## 2013-08-26 ENCOUNTER — Other Ambulatory Visit: Payer: Self-pay | Admitting: Cardiothoracic Surgery

## 2013-08-26 ENCOUNTER — Ambulatory Visit (INDEPENDENT_AMBULATORY_CARE_PROVIDER_SITE_OTHER): Payer: Medicare Other | Admitting: Internal Medicine

## 2013-08-26 ENCOUNTER — Encounter (INDEPENDENT_AMBULATORY_CARE_PROVIDER_SITE_OTHER): Payer: Self-pay | Admitting: Internal Medicine

## 2013-08-26 VITALS — BP 106/62 | HR 72 | Temp 97.6°F | Ht 63.0 in | Wt 116.9 lb

## 2013-08-26 DIAGNOSIS — C3490 Malignant neoplasm of unspecified part of unspecified bronchus or lung: Secondary | ICD-10-CM

## 2013-08-26 DIAGNOSIS — K5090 Crohn's disease, unspecified, without complications: Secondary | ICD-10-CM

## 2013-08-26 NOTE — Patient Instructions (Signed)
Continue Apriso. OV in 6 months.

## 2013-08-26 NOTE — Progress Notes (Signed)
Subjective:     Patient ID: Natalie Carlson, female   DOB: [birth year], 80 y.o.   MRN: [id number]  HPI Here today for f/u of her proctosigmoiditis felt to be secondary to Crohn's disease. She feels good. She has one BM daily. There has been no diarrhea. No melena or rectal bleeding.  She does have some mucous at times from her rectum. She changes her panty liner about 2-3 times a day.  Appetite is good. She has gained 14.5 pounds since May of 2015.  She is not having any side effects with the medication.  She continues to have back pain, but it is better.   10/04/12 : Colonoscopy: Dr. Laural Golden: diarrhea and rectal discharge.  Impression:  Multiple erosions involving mucosa of sigmoid colon and rectum. Biopsy taken from these areas. Suspect we may be dealing with low-grade IBD.   Review of Systems Past Medical History  Diagnosis Date  . Hypothyroidism   . Cataract of both eyes   . Blood in stool   . Mucus in stool   . Chronic diarrhea   . Hyperlipidemia     Takes Fish Oil daily;was on Zetia but taken off by medical MD over a yr ago  . Colitis   . History of colon polyps   . History of migraines   . Arthritis   . Chronic back pain   . Hemorrhoids   . Macular degeneration, dry   . Adenocarcinoma of lung     Stage  1A  EGFR positive  ALK-negative   . Atrial fibrillation   . Nephrolithiasis     1994 - passed on its on    Past Surgical History  Procedure Laterality Date  . Cataract extraction  2010/2011  . Bladder tack  1996  . Colonoscopy  3 YRS AGO  . Thyroid surgery  1961  . Colonoscopy  03/06/2011    Procedure: COLONOSCOPY;  Surgeon: Rogene Houston, MD;  Location: [facility];  Service: Endoscopy;  Laterality: N/A;  7:30 am  . Tonsillectomy    . Esophagogastroduodenoscopy    . Cardioversion  11/02/09    x 2  . Video bronchoscopy  05/31/2011    Procedure: VIDEO BRONCHOSCOPY;  Surgeon: Grace Isaac, MD;  Location: [facility];  Service: Thoracic;  Laterality: N/A;  . Flexible  sigmoidoscopy N/A 10/04/2012    Procedure: FLEXIBLE SIGMOIDOSCOPY;  Surgeon: Rogene Houston, MD;  Location: [facility];  Service: Endoscopy;  Laterality: N/A;  11:00-moved to 1015 Ann to notify pt    Allergies  Allergen Reactions  . Doxycycline Swelling    SEVERE FACIAL SWELLING AND BURNING  . Entocort Ec [Budesonide]     Patient saw her PCP, Dr. Eula Fried and he made Dr. Laural Golden aware that he had d/c patient taking Entocort due to the side effects she was experiencing. SEVERE FACIAL BURNING  . Neurontin [Gabapentin] Swelling    Per the patient she also has itching  . Vicodin [Hydrocodone-Acetaminophen] Rash    Rash,Redness, and itching  . Adhesive [Tape] Other (See Comments)    Took skin off  . Chocolate Other (See Comments)    Causes migraines  . Codeine     REACTION: nausea  . Morphine And Related Nausea Only  . Penicillins Other (See Comments)    Childhood allergy  . Prednisone     Itching   . Uncoded Nonscreenable Allergen Other (See Comments)    rasberry   headaches  . Naprosyn [Naproxen] Other (See Comments)  flush  . Tramadol Itching    Current Outpatient Prescriptions on File Prior to Visit  Medication Sig Dispense Refill  . acetaminophen (TYLENOL) 650 MG CR tablet Take 650 mg by mouth every 8 (eight) hours as needed. For pain      . ALPRAZolam (XANAX) 0.5 MG tablet Take 0.5 mg by mouth at bedtime as needed. For sleep      . diclofenac sodium (VOLTAREN) 1 % GEL Apply 1 application topically 2 (two) times daily as needed. For neck pain      . diltiazem (CARDIZEM CD) 180 MG 24 hr capsule Take 1 capsule (180 mg total) by mouth daily.  30 capsule  6  . fish oil-omega-3 fatty acids 1000 MG capsule Take 1,200 g by mouth daily.       . mesalamine (APRISO) 0.375 G 24 hr capsule Take 375 mg by mouth daily.      . Multiple Vitamins-Minerals (HAIR/SKIN/NAILS PO) Take 1 tablet by mouth daily.       Marland Kitchen warfarin (COUMADIN) 5 MG tablet Take 1 tablet by mouth on Tuesday, Thursday,  and Sat. Then take 1 & 1/2 tablets on Mon, Wed, Fri, and Sun  45 tablet  3   No current facility-administered medications on file prior to visit.      Objective:   Physical Exam  Filed Vitals:   08/26/13 0959  BP: 106/62  Pulse: 72  Temp: 97.6 F (36.4 C)  Height: 5' 3"  (1.6 m)  Weight: 116 lb 14.4 oz (53.025 kg)   Alert and oriented. Skin warm and dry. Oral mucosa is moist.   . Sclera anicteric, conjunctivae is pink. Thyroid not enlarged. No cervical lymphadenopathy. Lungs clear. Heart regular rate and rhythm.  Abdomen is soft. Bowel sounds are positive. No hepatomegaly. No abdominal masses felt. Slight tenderness rt lower quadrant.  No edema to lower extremities.      Assessment:       Proctosigmoiditis felt to be secondary to Crohn's colitis. There is no diarrhea at this time. She does have rectal discharge daily.  Weight loss she has gained 14.5 pounds since her last visit in May of 2014.    Plan:     Continue the Aprisa.  OV in 6 months. Apriso Savings card given to patient.  Weight loss: She has gained 14 pounds since her visit in May of 2014.

## 2013-08-28 ENCOUNTER — Ambulatory Visit
Admission: RE | Admit: 2013-08-28 | Discharge: 2013-08-28 | Disposition: A | Discharge status: Home / Self Care | Payer: Medicare Other | Source: Ambulatory Visit | Attending: Cardiothoracic Surgery | Admitting: Cardiothoracic Surgery

## 2013-08-28 ENCOUNTER — Other Ambulatory Visit: Payer: Self-pay | Admitting: *Deleted

## 2013-08-28 ENCOUNTER — Encounter: Payer: Self-pay | Admitting: Cardiothoracic Surgery

## 2013-08-28 ENCOUNTER — Ambulatory Visit (INDEPENDENT_AMBULATORY_CARE_PROVIDER_SITE_OTHER): Payer: Medicare Other | Admitting: Cardiothoracic Surgery

## 2013-08-28 VITALS — BP 140/87 | HR 76 | Resp 20 | Ht 63.0 in | Wt 116.0 lb

## 2013-08-28 DIAGNOSIS — Z9889 Other specified postprocedural states: Secondary | ICD-10-CM

## 2013-08-28 DIAGNOSIS — C3490 Malignant neoplasm of unspecified part of unspecified bronchus or lung: Secondary | ICD-10-CM

## 2013-08-28 DIAGNOSIS — Z902 Acquired absence of lung [part of]: Secondary | ICD-10-CM

## 2013-08-28 DIAGNOSIS — Z85118 Personal history of other malignant neoplasm of bronchus and lung: Secondary | ICD-10-CM

## 2013-08-28 NOTE — Progress Notes (Signed)
[address]       [place] [address]             [phone]                                      Ronnika Y Chrobak Lavina Medical Record #[id number] Date of Birth: [birth year]  [name], MD [name], MD  Chief Complaint:   PostOp Follow Up Visit PROCEDURE PERFORMED 05/31/2011: Bronchoscopy, right video-assisted thoracoscopy,  right upper lobe lung biopsy with right upper lobectomy and lymph node  Dissection.   adenocarcinoma the right upper lobe pT1b, pN0, cM0   EGFR positive    History of Present Illness:      Patient returns today for followup Ct of chest and visit after right upper lobectomy done 05/31/2011 for adenocarcinoma the right upper lobe pT1b, pN0   EGFR positive and ALK negative.  Patient notes over the past several weeks having a "red face" with some burning sensation. In November of 2014 she was concerned about fullness in her left supraclavicular area was seen in the emergency room with in [place], a CT scan of the neck was performed but raise the suggestion of possible enlargement of the known left upper lobe lesion. She also complains of some right hip and right leg bone pain. She notes her primary Dr is arranging for a bone scan and has injected her right hip.  Patient originally saw Dr. Sonny Dandy, but currently her only followup oncology visits are here.    History  Smoking status  . Never Smoker   Smokeless tobacco  . Never Used     Allergies  Allergen Reactions  . Doxycycline Swelling    SEVERE FACIAL SWELLING AND BURNING  . Entocort Ec [Budesonide]     Patient saw her PCP, Dr. Eula Fried and he made Dr. Laural Golden aware that he had d/c patient taking Entocort due to the side effects she was experiencing. SEVERE FACIAL BURNING  . Neurontin [Gabapentin] Swelling    Per the patient she also has itching  . Vicodin [Hydrocodone-Acetaminophen] Rash    Rash,Redness, and itching  . Adhesive [Tape] Other (See Comments)    Took skin  off  . Chocolate Other (See Comments)    Causes migraines  . Codeine     REACTION: nausea  . Morphine And Related Nausea Only  . Penicillins Other (See Comments)    Childhood allergy  . Prednisone     Itching   . Uncoded Nonscreenable Allergen Other (See Comments)    rasberry   headaches  . Naprosyn [Naproxen] Other (See Comments)    flush  . Tramadol Itching    Current Outpatient Prescriptions  Medication Sig Dispense Refill  . acetaminophen (TYLENOL) 650 MG CR tablet Take 650 mg by mouth every 8 (eight) hours as needed. For pain      . ALPRAZolam (XANAX) 0.5 MG tablet Take 0.5 mg by mouth at bedtime as needed. For sleep      . diclofenac sodium (VOLTAREN) 1 % GEL Apply 1 application topically 2 (two) times daily as needed. For neck pain      . diltiazem (CARDIZEM CD) 180 MG 24 hr capsule Take 1 capsule (180 mg total) by mouth daily.  30 capsule  6  . fish oil-omega-3 fatty acids 1000 MG capsule Take 1,200 g by mouth daily.       Marland Kitchen  mesalamine (APRISO) 0.375 G 24 hr capsule Take 375 mg by mouth 4 (four) times daily.       . Multiple Vitamins-Minerals (HAIR/SKIN/NAILS PO) Take 1 tablet by mouth daily.       Marland Kitchen warfarin (COUMADIN) 5 MG tablet Take 1 tablet by mouth on Tuesday, Thursday, and Sat. Then take 1 & 1/2 tablets on Mon, Wed, Fri, and Sun  45 tablet  3   No current facility-administered medications for this visit.    Physical Exam: BP 140/87  Pulse 76  Resp 20  Ht 5' 3"  (1.6 m)  Wt 116 lb (52.617 kg)  BMI 20.55 kg/m2  SpO2 98%  General appearance: alert, cooperative, appears stated age and no distress Neurologic: intact Heart: irregularly irregular rhythm Lungs: clear to auscultation bilaterally, there is no wheezing on physical exam Abdomen: soft, non-tender; bowel sounds normal; no masses,  no organomegaly Extremities: extremities normal, atraumatic, no cyanosis or edema and Homans sign is negative, no sign of DVT Wound: Right chest incisions are well-healed  He  has no cervical or supraclavicular adenopathy no axillary adenopathy, she does have a prominent left external jugular vein.  Diagnostic Studies & Laboratory data:         Recent Radiology Findings: Dg Chest 2 View  08/28/2013   CLINICAL DATA:  Lung cancer.  Follow-up partial right lobectomy.  EXAM: CHEST  2 VIEW  COMPARISON:  DG CHEST 2V dated 03/29/2013  FINDINGS: Normal cardiac silhouette. Lungs are hyperinflated. Effusion, infiltrate, or pneumothorax. No acute osseous abnormality.  IMPRESSION: Hyperinflated lungs.  No acute findings.   Electronically Signed   By: Suzy Bouchard M.D.   On: 08/28/2013 08:10   Dg Chest 2 View  08/28/2013   CLINICAL DATA:  Lung cancer.  Follow-up partial right lobectomy.  EXAM: CHEST  2 VIEW  COMPARISON:  DG CHEST 2V dated 03/29/2013  FINDINGS: Normal cardiac silhouette. Lungs are hyperinflated. Effusion, infiltrate, or pneumothorax. No acute osseous abnormality.  IMPRESSION: Hyperinflated lungs.  No acute findings.   Electronically Signed   By: Suzy Bouchard M.D.   On: 08/28/2013 08:10   Ct Super D Chest Wo Contrast  08/28/2013   CLINICAL DATA:  Right lung mass. Right lung resection in 2013. Nonsmoker.  EXAM: CT CHEST WITHOUT CONTRAST  TECHNIQUE: Multidetector CT imaging of the chest was performed using thin slice collimation for electromagnetic bronchoscopy planning purposes, without intravenous contrast.  COMPARISON:  Plain film 08/28/2013.  CT 02/27/2013.  FINDINGS: Lungs/Pleura:  Surgical changes of right upper lobectomy.  Mild thickening along the right sided fissure is not significantly changed, including on image 33.  Subpleural right lower lobe density on image 41 is similar.  Right lower lobe calcified granuloma, image 34.  A medial right lower lobe 3 mm nodule on image 48 is not readily apparent on prior exams. This may be due to differences in slice selection.  Left base scarring. Mild left upper lobe subpleural nodularity on image 33 is similar.  No  pleural fluid.  Heart/Mediastinum: Diffuse thyroid enlargement which is unchanged. No well-defined dominant mass.  Tortuous descending thoracic aorta. Heart mildly enlarged and accentuated by pectus excavatum deformity. No mediastinal or definite hilar adenopathy, given limitations of unenhanced CT.  Upper Abdomen:  Normal adrenal glands.  Bones/Musculoskeletal: Moderate osteopenia. Degenerative disc disease at the L1-2 level.  IMPRESSION: 1. Surgical changes of right upper lobectomy. No evidence of locally recurrent or metastatic disease. 2. Scattered tiny pulmonary nodules, primarily similar. A possibly new right lower lobe nodule  may have been obscured on prior exam secondary to slice selection. Recommend attention on follow-up.   Electronically Signed   By: Abigail Miyamoto M.D.   On: 08/28/2013 12:34    Recent Labs: Lab Results  Component Value Date   WBC 10.9* 06/05/2011   HGB 12.8 06/05/2011   HCT 38.5 06/05/2011   PLT 278 06/05/2011   GLUCOSE 127* 06/05/2011   ALT 17 06/02/2011   AST 28 06/02/2011   NA 138 06/05/2011   K 4.2 06/05/2011   CL 102 06/05/2011   CREATININE 0.64 07/17/2012   BUN 12 07/17/2012   CO2 27 06/05/2011   INR 2.5 08/19/2013      Assessment / Plan:   No evidence of recurrent Lung CA on exam or CT of Chest today now about  21 month  following surgical resection of the right upper lobe for EGRF positive well-differentiated adenocarcinoma 2.2 cm in size.   Retuen 6 months with repet ct of the chest   Grace Isaac MD 08/28/2013 9:40 AM

## 2013-09-17 ENCOUNTER — Telehealth: Payer: Self-pay | Admitting: *Deleted

## 2013-09-17 NOTE — Telephone Encounter (Signed)
Appt rescheduled for 10/07/13.  Pt in agreement.

## 2013-09-17 NOTE — Telephone Encounter (Signed)
Patient is having an injection in her back on 09-25-13. Coming off coumdin 09-21-13. Has appointment on 09-26-13 for coumdin check. Does she need to re-schedule this appointment. Can leave message On answering machine.

## 2013-09-29 ENCOUNTER — Telehealth (INDEPENDENT_AMBULATORY_CARE_PROVIDER_SITE_OTHER): Payer: Self-pay | Admitting: *Deleted

## 2013-09-29 NOTE — Telephone Encounter (Signed)
Needs Delzicol samples - her return phone number is [phone] or [phone].

## 2013-09-30 NOTE — Telephone Encounter (Signed)
Samples of Delzicol ready for the patient. Patient called and made aware.

## 2013-10-07 ENCOUNTER — Ambulatory Visit (INDEPENDENT_AMBULATORY_CARE_PROVIDER_SITE_OTHER): Payer: Medicare Other | Admitting: *Deleted

## 2013-10-07 DIAGNOSIS — Z5181 Encounter for therapeutic drug level monitoring: Secondary | ICD-10-CM

## 2013-10-07 DIAGNOSIS — Z7901 Long term (current) use of anticoagulants: Secondary | ICD-10-CM

## 2013-10-07 DIAGNOSIS — I4891 Unspecified atrial fibrillation: Secondary | ICD-10-CM

## 2013-10-07 LAB — POCT INR: INR: 4.1

## 2013-10-21 ENCOUNTER — Ambulatory Visit (INDEPENDENT_AMBULATORY_CARE_PROVIDER_SITE_OTHER): Payer: Medicare Other | Admitting: *Deleted

## 2013-10-21 DIAGNOSIS — Z7901 Long term (current) use of anticoagulants: Secondary | ICD-10-CM

## 2013-10-21 DIAGNOSIS — Z5181 Encounter for therapeutic drug level monitoring: Secondary | ICD-10-CM

## 2013-10-21 DIAGNOSIS — I4891 Unspecified atrial fibrillation: Secondary | ICD-10-CM

## 2013-10-21 LAB — POCT INR: INR: 4.1

## 2013-11-04 ENCOUNTER — Ambulatory Visit (INDEPENDENT_AMBULATORY_CARE_PROVIDER_SITE_OTHER): Payer: Medicare Other | Admitting: *Deleted

## 2013-11-04 DIAGNOSIS — I4891 Unspecified atrial fibrillation: Secondary | ICD-10-CM

## 2013-11-04 DIAGNOSIS — Z5181 Encounter for therapeutic drug level monitoring: Secondary | ICD-10-CM

## 2013-11-04 DIAGNOSIS — Z7901 Long term (current) use of anticoagulants: Secondary | ICD-10-CM

## 2013-11-04 DIAGNOSIS — I482 Chronic atrial fibrillation, unspecified: Secondary | ICD-10-CM

## 2013-11-04 LAB — POCT INR: INR: 2.1

## 2013-11-05 ENCOUNTER — Other Ambulatory Visit: Payer: Self-pay | Admitting: Cardiology

## 2013-11-05 ENCOUNTER — Other Ambulatory Visit (INDEPENDENT_AMBULATORY_CARE_PROVIDER_SITE_OTHER): Payer: Self-pay | Admitting: Internal Medicine

## 2013-11-05 NOTE — Telephone Encounter (Signed)
Per Dr.Rehman may fill with 6 refills.

## 2013-11-21 ENCOUNTER — Ambulatory Visit (INDEPENDENT_AMBULATORY_CARE_PROVIDER_SITE_OTHER): Payer: Medicare Other | Admitting: *Deleted

## 2013-11-21 DIAGNOSIS — Z7901 Long term (current) use of anticoagulants: Secondary | ICD-10-CM

## 2013-11-21 DIAGNOSIS — Z5181 Encounter for therapeutic drug level monitoring: Secondary | ICD-10-CM

## 2013-11-21 DIAGNOSIS — I4891 Unspecified atrial fibrillation: Secondary | ICD-10-CM

## 2013-11-21 LAB — POCT INR: INR: 2.1

## 2013-12-05 ENCOUNTER — Other Ambulatory Visit: Payer: Self-pay | Admitting: Cardiology

## 2013-12-12 ENCOUNTER — Ambulatory Visit (INDEPENDENT_AMBULATORY_CARE_PROVIDER_SITE_OTHER): Payer: Medicare Other | Admitting: *Deleted

## 2013-12-12 DIAGNOSIS — Z7901 Long term (current) use of anticoagulants: Secondary | ICD-10-CM

## 2013-12-12 DIAGNOSIS — Z5181 Encounter for therapeutic drug level monitoring: Secondary | ICD-10-CM

## 2013-12-12 DIAGNOSIS — I4891 Unspecified atrial fibrillation: Secondary | ICD-10-CM

## 2013-12-12 LAB — POCT INR: INR: 2.1

## 2014-01-07 ENCOUNTER — Emergency Department (HOSPITAL_COMMUNITY)
Admission: EM | Admit: 2014-01-07 | Discharge: 2014-01-07 | Disposition: A | Discharge status: Home / Self Care | Payer: Medicare Other | Attending: Emergency Medicine | Admitting: Emergency Medicine

## 2014-01-07 ENCOUNTER — Emergency Department (HOSPITAL_COMMUNITY): Payer: Medicare Other

## 2014-01-07 ENCOUNTER — Encounter (HOSPITAL_COMMUNITY): Payer: Self-pay | Admitting: Emergency Medicine

## 2014-01-07 DIAGNOSIS — Z7901 Long term (current) use of anticoagulants: Secondary | ICD-10-CM | POA: Diagnosis not present

## 2014-01-07 DIAGNOSIS — Z8669 Personal history of other diseases of the nervous system and sense organs: Secondary | ICD-10-CM | POA: Insufficient documentation

## 2014-01-07 DIAGNOSIS — R51 Headache: Secondary | ICD-10-CM | POA: Insufficient documentation

## 2014-01-07 DIAGNOSIS — Z87442 Personal history of urinary calculi: Secondary | ICD-10-CM | POA: Diagnosis not present

## 2014-01-07 DIAGNOSIS — I509 Heart failure, unspecified: Secondary | ICD-10-CM | POA: Diagnosis not present

## 2014-01-07 DIAGNOSIS — M129 Arthropathy, unspecified: Secondary | ICD-10-CM | POA: Insufficient documentation

## 2014-01-07 DIAGNOSIS — Z880 Allergy status to penicillin: Secondary | ICD-10-CM | POA: Insufficient documentation

## 2014-01-07 DIAGNOSIS — Z85118 Personal history of other malignant neoplasm of bronchus and lung: Secondary | ICD-10-CM | POA: Diagnosis not present

## 2014-01-07 DIAGNOSIS — E785 Hyperlipidemia, unspecified: Secondary | ICD-10-CM | POA: Insufficient documentation

## 2014-01-07 DIAGNOSIS — R079 Chest pain, unspecified: Secondary | ICD-10-CM | POA: Diagnosis present

## 2014-01-07 DIAGNOSIS — Z8719 Personal history of other diseases of the digestive system: Secondary | ICD-10-CM | POA: Insufficient documentation

## 2014-01-07 DIAGNOSIS — Z860100 Personal history of colon polyps, unspecified: Secondary | ICD-10-CM | POA: Insufficient documentation

## 2014-01-07 DIAGNOSIS — Z79899 Other long term (current) drug therapy: Secondary | ICD-10-CM | POA: Diagnosis not present

## 2014-01-07 DIAGNOSIS — R110 Nausea: Secondary | ICD-10-CM | POA: Diagnosis not present

## 2014-01-07 DIAGNOSIS — Z862 Personal history of diseases of the blood and blood-forming organs and certain disorders involving the immune mechanism: Secondary | ICD-10-CM | POA: Insufficient documentation

## 2014-01-07 DIAGNOSIS — G8929 Other chronic pain: Secondary | ICD-10-CM | POA: Insufficient documentation

## 2014-01-07 DIAGNOSIS — Z9849 Cataract extraction status, unspecified eye: Secondary | ICD-10-CM | POA: Diagnosis not present

## 2014-01-07 DIAGNOSIS — I4891 Unspecified atrial fibrillation: Secondary | ICD-10-CM | POA: Insufficient documentation

## 2014-01-07 DIAGNOSIS — R609 Edema, unspecified: Secondary | ICD-10-CM

## 2014-01-07 DIAGNOSIS — Z8639 Personal history of other endocrine, nutritional and metabolic disease: Secondary | ICD-10-CM | POA: Insufficient documentation

## 2014-01-07 LAB — CBC WITH DIFFERENTIAL/PLATELET
BASOS PCT: 0 % (ref 0–1)
Basophils Absolute: 0 10*3/uL (ref 0.0–0.1)
EOS ABS: 0.1 10*3/uL (ref 0.0–0.7)
Eosinophils Relative: 2 % (ref 0–5)
HCT: 43.3 % (ref 36.0–46.0)
HEMOGLOBIN: 14.7 g/dL (ref 12.0–15.0)
LYMPHS ABS: 1.4 10*3/uL (ref 0.7–4.0)
Lymphocytes Relative: 21 % (ref 12–46)
MCH: 32.3 pg (ref 26.0–34.0)
MCHC: 33.9 g/dL (ref 30.0–36.0)
MCV: 95.2 fL (ref 78.0–100.0)
Monocytes Absolute: 0.9 10*3/uL (ref 0.1–1.0)
Monocytes Relative: 13 % — ABNORMAL HIGH (ref 3–12)
NEUTROS PCT: 64 % (ref 43–77)
Neutro Abs: 4.2 10*3/uL (ref 1.7–7.7)
Platelets: 290 10*3/uL (ref 150–400)
RBC: 4.55 MIL/uL (ref 3.87–5.11)
RDW: 13.1 % (ref 11.5–15.5)
WBC: 6.7 10*3/uL (ref 4.0–10.5)

## 2014-01-07 LAB — BASIC METABOLIC PANEL
Anion gap: 10 (ref 5–15)
BUN: 14 mg/dL (ref 6–23)
CO2: 29 mEq/L (ref 19–32)
Calcium: 9.1 mg/dL (ref 8.4–10.5)
Chloride: 102 mEq/L (ref 96–112)
Creatinine, Ser: 0.68 mg/dL (ref 0.50–1.10)
GFR, EST NON AFRICAN AMERICAN: 79 mL/min — AB (ref >90)
GLUCOSE: 100 mg/dL — AB (ref 70–99)
Potassium: 4.2 mEq/L (ref 3.7–5.3)
SODIUM: 141 meq/L (ref 137–147)

## 2014-01-07 LAB — PRO B NATRIURETIC PEPTIDE: PRO B NATRI PEPTIDE: 739.8 pg/mL — AB (ref 0–450)

## 2014-01-07 LAB — TROPONIN I: Troponin I: <0.3 ng/mL (ref <0.30)

## 2014-01-07 MED ORDER — POTASSIUM CHLORIDE ER 10 MEQ PO TBCR: 10.0000 meq | EXTENDED_RELEASE_TABLET | Freq: Two times a day (BID) | ORAL | Status: DC

## 2014-01-07 MED ORDER — FUROSEMIDE 40 MG PO TABS
40.0000 mg | ORAL_TABLET | Freq: Every day | ORAL | Status: DC
  Administered 2014-01-07: 40 mg via ORAL
  Filled 2014-01-07: qty 1

## 2014-01-07 MED ORDER — FUROSEMIDE 20 MG PO TABS: 20.0000 mg | ORAL_TABLET | Freq: Every day | ORAL | Status: DC

## 2014-01-07 NOTE — ED Provider Notes (Signed)
CSN: [id number]     Arrival date & time 01/07/14  1420 History   This chart was scribed for Tanna Furry, MD by Randa Evens, ED Scribe. This patient was seen in room APA03/AP[facility]03 and the patient's care was started at 4:36 PM.  Chief Complaint  Patient presents with  . Chest Pain   The history is provided by the patient. No language interpreter was used.   HPI Comments: Natalie Carlson is a 80 y.o. female who presents to the Emergency Department complaining of chest tightness onset 1 day prior. She states she has associated SOB, headache and nausea. She states she is feeling like she is having a difficulty breathing or catching her breath. She states that her symptoms worsen when laying down. She states that she was having a difficult time sleeping last night due to note being able to breath well. She states she has also noticed that her feet and legs have started to swell 2 weeks ago. She states that in 2013 she had her upper right lobe removed due to cancer. She states that her heart is in a-fib since 2010. She states that she also takes coumadin.    Past Medical History  Diagnosis Date  . Hypothyroidism   . Cataract of both eyes   . Blood in stool   . Mucus in stool   . Chronic diarrhea   . Hyperlipidemia     Takes Fish Oil daily;was on Zetia but taken off by medical MD over a yr ago  . Colitis   . History of colon polyps   . History of migraines   . Arthritis   . Chronic back pain   . Hemorrhoids   . Macular degeneration, dry   . Adenocarcinoma of lung     Stage  1A  EGFR positive  ALK-negative   . Atrial fibrillation   . Nephrolithiasis     1994 - passed on its on   Past Surgical History  Procedure Laterality Date  . Cataract extraction  2010/2011  . Bladder tack  1996  . Colonoscopy  3 YRS AGO  . Thyroid surgery  1961  . Colonoscopy  03/06/2011    Procedure: COLONOSCOPY;  Surgeon: Rogene Houston, MD;  Location: [facility];  Service: Endoscopy;  Laterality: N/A;  7:30  am  . Tonsillectomy    . Esophagogastroduodenoscopy    . Cardioversion  11/02/09    x 2  . Video bronchoscopy  05/31/2011    Procedure: VIDEO BRONCHOSCOPY;  Surgeon: Grace Isaac, MD;  Location: [facility];  Service: Thoracic;  Laterality: N/A;  . Flexible sigmoidoscopy N/A 10/04/2012    Procedure: FLEXIBLE SIGMOIDOSCOPY;  Surgeon: Rogene Houston, MD;  Location: [facility];  Service: Endoscopy;  Laterality: N/A;  11:00-moved to 1015 Ann to notify pt   Family History  Problem Relation Age of Onset  . Stroke Mother   . Stroke Father   . Stroke Other     two siblings  . Heart disease Other     sibling  . Liver disease Paternal Grandmother   . Anesthesia problems Sister   . Hypotension Neg Hx   . Malignant hyperthermia Neg Hx   . Pseudochol deficiency Neg Hx    History  Substance Use Topics  . Smoking status: Never Smoker   . Smokeless tobacco: Never Used  . Alcohol Use: No   OB History   Grav Para Term Preterm Abortions TAB SAB Ect Mult Living  Review of Systems  Constitutional: Negative for fever, chills, diaphoresis, appetite change and fatigue.  HENT: Negative for mouth sores, sore throat and trouble swallowing.   Eyes: Negative for visual disturbance.  Respiratory: Positive for chest tightness and shortness of breath. Negative for cough and wheezing.   Cardiovascular: Positive for leg swelling.  Gastrointestinal: Positive for nausea. Negative for vomiting, abdominal pain, diarrhea and abdominal distention.  Endocrine: Negative for polydipsia, polyphagia and polyuria.  Genitourinary: Negative for dysuria, frequency and hematuria.  Musculoskeletal: Negative for gait problem.  Skin: Negative for color change, pallor and rash.  Neurological: Positive for headaches. Negative for dizziness, syncope and light-headedness.  Hematological: Does not bruise/bleed easily.  Psychiatric/Behavioral: Positive for sleep disturbance. Negative for behavioral problems and  confusion.    Allergies  Doxycycline; Entocort ec; Neurontin; Vicodin; Adhesive; Chocolate; Codeine; Morphine and related; Penicillins; Prednisone; Uncoded nonscreenable allergen; Naprosyn; and Tramadol  Home Medications   Prior to Admission medications   Medication Sig Start Date End Date Taking? Authorizing Provider  acetaminophen (TYLENOL) 325 MG tablet Take 325 mg by mouth every 6 (six) hours as needed. pain   Yes Historical Provider, MD  ALPRAZolam (XANAX) 0.5 MG tablet Take 0.5 mg by mouth every 8 (eight) hours as needed for anxiety.   Yes Historical Provider, MD  diclofenac sodium (VOLTAREN) 1 % GEL Apply 1 application topically 2 (two) times daily as needed. For neck pain   Yes Historical Provider, MD  diltiazem (CARDIZEM) 120 MG tablet Take 120 mg by mouth daily.   Yes Historical Provider, MD  mesalamine (APRISO) 0.375 G 24 hr capsule Take 0.75 mg by mouth 2 (two) times daily.    Yes Historical Provider, MD  Multiple Vitamins-Minerals (HAIR/SKIN/NAILS) TABS Take 1 tablet by mouth daily.   Yes Historical Provider, MD  Omega-3 Fatty Acids (FISH OIL) 1200 MG CAPS Take 1,200 mg by mouth daily.   Yes Historical Provider, MD  warfarin (COUMADIN) 5 MG tablet Take 5-7.5 mg by mouth daily. Patient takes 46m on Monday & Friday and 7.552mall other days   Yes Historical Provider, MD  furosemide (LASIX) 20 MG tablet Take 1 tablet (20 mg total) by mouth daily. 01/07/14   MaTanna FurryMD  potassium chloride (K-DUR) 10 MEQ tablet Take 1 tablet (10 mEq total) by mouth 2 (two) times daily. 01/07/14   MaTanna FurryMD   Triage Vitals: BP 129/68  Pulse 72  Temp(Src) 97.7 F (36.5 C) (Oral)  Resp 20  Ht 5' 3"  (1.6 m)  Wt 118 lb (53.524 kg)  BMI 20.91 kg/m2  SpO2 97%  Physical Exam  Nursing note and vitals reviewed. Constitutional: She is oriented to person, place, and time. She appears well-developed and well-nourished. No distress.  HENT:  Head: Normocephalic.  Eyes: Conjunctivae are normal.  Pupils are equal, round, and reactive to light. No scleral icterus.  Neck: Normal range of motion. Neck supple. No thyromegaly present.  thyroidectomy scar  Cardiovascular: Normal rate.  An irregularly irregular rhythm present. Exam reveals no gallop and no friction rub.   No murmur heard. A-fib on monitor  Pulmonary/Chest: Effort normal and breath sounds normal. No respiratory distress. She has no wheezes. She has no rales.  Abdominal: Soft. Bowel sounds are normal. She exhibits no distension. There is no tenderness. There is no rebound.  Musculoskeletal: Normal range of motion.  1+ lower extremity edema  Neurological: She is alert and oriented to person, place, and time.  Skin: Skin is warm and dry. No rash noted.  Psychiatric: She has a normal mood and affect. Her behavior is normal.    ED Course  Procedures (including critical care time) DIAGNOSTIC STUDIES: Oxygen Saturation is 97% on RA, normal by my interpretation.    COORDINATION OF CARE: 4:49 PM-Discussed treatment plan which includes  (CXR, CBC panel, CMP, UA) with pt at bedside and pt agreed to plan.     Labs Review Labs Reviewed  CBC WITH DIFFERENTIAL - Abnormal; Notable for the following:    Monocytes Relative 13 (*)    All other components within normal limits  BASIC METABOLIC PANEL - Abnormal; Notable for the following:    Glucose, Bld 100 (*)    GFR calc non Af Amer 79 (*)    All other components within normal limits  PRO B NATRIURETIC PEPTIDE - Abnormal; Notable for the following:    Pro B Natriuretic peptide (BNP) 739.8 (*)    All other components within normal limits  TROPONIN I    Imaging Review Dg Chest Portable 1 View  01/07/2014   CLINICAL DATA:  Chest pain  EXAM: PORTABLE CHEST - 1 VIEW  COMPARISON:  10/12/2013.  FINDINGS: 1634 hrs. Lungs are hyperexpanded, compatible with emphysema. Interstitial markings are diffusely coarsened with chronic features. No edema or focal airspace consolidation. No  pleural effusion. Subsegmental atelectasis or linear scarring noted at the left base. Cardiopericardial silhouette is at upper limits of normal for size. Imaged bony structures of the thorax are intact.  IMPRESSION: Stable.  Emphysema without acute cardiopulmonary findings.   Electronically Signed   By: Misty Stanley M.D.   On: 01/07/2014 16:40     EKG Interpretation   Date/Time:  Wednesday January 07 2014 14:23:42 EDT Ventricular Rate:  79 PR Interval:    QRS Duration: 88 QT Interval:  368 QTC Calculation: 421 R Axis:   89 Text Interpretation:  Atrial fibrillation Cannot rule out Anterior infarct  , age undetermined Abnormal ECG Confirmed by Jeneen Rinks  MD, [place] ([id number]) on  01/07/2014 4:38:03 PM      MDM   Final diagnoses:  Dependent edema  Congestive heart failure, unspecified congestive heart failure chronicity, unspecified congestive heart failure type    Clinically and radiographically no signs of left-sided congestive heart failure. Has some dependent edema and a minimally elevated beta natriuretic peptide. She is ambulatory. She is rate controlled with her A. fib. She is symptom-free. I think this is appropriate for discharge her with daily morning Lasix, potassium replacement, close cardiac followup, and return to emergency with any symptoms. She expresses her understanding of this and strongly desires to be discharged.    I personally performed the services described in this documentation, which was scribed in my presence. The recorded information has been reviewed and is accurate.      Tanna Furry, MD 01/07/14 Bosie Helper

## 2014-01-07 NOTE — Discharge Instructions (Signed)
Take Lasix each morning to help eliminate fluid. Call your cardiologist for recheck appointment. Return to emergency room if any worsening symptoms.  Edema Edema is an abnormal buildup of fluids. It is more common in your legs and thighs. Painless swelling of the feet and ankles is more likely as a person ages. It also is common in looser skin, like around your eyes. HOME CARE   Keep the affected body part above the level of the heart while lying down.  Do not sit still or stand for a long time.  Do not put anything right under your knees when you lie down.  Do not wear tight clothes on your upper legs.  Exercise your legs to help the puffiness (swelling) go down.  Wear elastic bandages or support stockings as told by your doctor.  A low-salt diet may help lessen the puffiness.  Only take medicine as told by your doctor. GET HELP IF:  Treatment is not working.  You have heart, liver, or kidney disease and notice that your skin looks puffy or shiny.  You have puffiness in your legs that does not get better when you raise your legs.  You have sudden weight gain for no reason. GET HELP RIGHT AWAY IF:   You have shortness of breath or chest pain.  You cannot breathe when you lie down.  You have pain, redness, or warmth in the areas that are puffy.  You have heart, liver, or kidney disease and get edema all of a sudden.  You have a fever and your symptoms get worse all of a sudden. MAKE SURE YOU:   Understand these instructions.  Will watch your condition.  Will get help right away if you are not doing well or get worse. Document Released: 10/18/2007 Document Revised: 05/06/2013 Document Reviewed: 02/21/2013 [facility] Patient Information 2015 [place], [place]. This information is not intended to replace advice given to you by your health care provider. Make sure you discuss any questions you have with your health care provider.  Heart Failure Heart failure is a  condition in which the heart has trouble pumping blood. This means your heart does not pump blood efficiently for your body to work well. In some cases of heart failure, fluid may back up into your lungs or you may have swelling (edema) in your lower legs. Heart failure is usually a long-term (chronic) condition. It is important for you to take good care of yourself and follow your health care provider's treatment plan. CAUSES  Some health conditions can cause heart failure. Those health conditions include:  High blood pressure (hypertension). Hypertension causes the heart muscle to work harder than normal. When pressure in the blood vessels is high, the heart needs to pump (contract) with more force in order to circulate blood throughout the body. High blood pressure eventually causes the heart to become stiff and weak.  Coronary artery disease (CAD). CAD is the buildup of cholesterol and fat (plaque) in the arteries of the heart. The blockage in the arteries deprives the heart muscle of oxygen and blood. This can cause chest pain and may lead to a heart attack. High blood pressure can also contribute to CAD.  Heart attack (myocardial infarction). A heart attack occurs when one or more arteries in the heart become blocked. The loss of oxygen damages the muscle tissue of the heart. When this happens, part of the heart muscle dies. The injured tissue does not contract as well and weakens the heart's ability to pump  blood.  Abnormal heart valves. When the heart valves do not open and close properly, it can cause heart failure. This makes the heart muscle pump harder to keep the blood flowing.  Heart muscle disease (cardiomyopathy or myocarditis). Heart muscle disease is damage to the heart muscle from a variety of causes. These can include drug or alcohol abuse, infections, or unknown reasons. These can increase the risk of heart failure.  Lung disease. Lung disease makes the heart work harder because  the lungs do not work properly. This can cause a strain on the heart, leading it to fail.  Diabetes. Diabetes increases the risk of heart failure. High blood sugar contributes to high fat (lipid) levels in the blood. Diabetes can also cause slow damage to tiny blood vessels that carry important nutrients to the heart muscle. When the heart does not get enough oxygen and food, it can cause the heart to become weak and stiff. This leads to a heart that does not contract efficiently.  Other conditions can contribute to heart failure. These include abnormal heart rhythms, thyroid problems, and low blood counts (anemia). Certain unhealthy behaviors can increase the risk of heart failure, including:  Being overweight.  Smoking or chewing tobacco.  Eating foods high in fat and cholesterol.  Abusing illicit drugs or alcohol.  Lacking physical activity. SYMPTOMS  Heart failure symptoms may vary and can be hard to detect. Symptoms may include:  Shortness of breath with activity, such as climbing stairs.  Persistent cough.  Swelling of the feet, ankles, legs, or abdomen.  Unexplained weight gain.  Difficulty breathing when lying flat (orthopnea).  Waking from sleep because of the need to sit up and get more air.  Rapid heartbeat.  Fatigue and loss of energy.  Feeling light-headed, dizzy, or close to fainting.  Loss of appetite.  Nausea.  Increased urination during the night (nocturia). DIAGNOSIS  A diagnosis of heart failure is based on your history, symptoms, physical examination, and diagnostic tests. Diagnostic tests for heart failure may include:  Echocardiography.  Electrocardiography.  Chest X-ray.  Blood tests.  Exercise stress test.  Cardiac angiography.  Radionuclide scans. TREATMENT  Treatment is aimed at managing the symptoms of heart failure. Medicines, behavioral changes, or surgical intervention may be necessary to treat heart failure.  Medicines to  help treat heart failure may include:  Angiotensin-converting enzyme (ACE) inhibitors. This type of medicine blocks the effects of a blood protein called angiotensin-converting enzyme. ACE inhibitors relax (dilate) the blood vessels and help lower blood pressure.  Angiotensin receptor blockers (ARBs). This type of medicine blocks the actions of a blood protein called angiotensin. Angiotensin receptor blockers dilate the blood vessels and help lower blood pressure.  Water pills (diuretics). Diuretics cause the kidneys to remove salt and water from the blood. The extra fluid is removed through urination. This loss of extra fluid lowers the volume of blood the heart pumps.  Beta blockers. These prevent the heart from beating too fast and improve heart muscle strength.  Digitalis. This increases the force of the heartbeat.  Healthy behavior changes include:  Obtaining and maintaining a healthy weight.  Stopping smoking or chewing tobacco.  Eating heart-healthy foods.  Limiting or avoiding alcohol.  Stopping illicit drug use.  Physical activity as directed by your health care provider.  Surgical treatment for heart failure may include:  A procedure to open blocked arteries, repair damaged heart valves, or remove damaged heart muscle tissue.  A pacemaker to improve heart muscle function and  control certain abnormal heart rhythms.  An internal cardioverter defibrillator to treat certain serious abnormal heart rhythms.  A left ventricular assist device (LVAD) to assist the pumping ability of the heart. HOME CARE INSTRUCTIONS   Take medicines only as directed by your health care provider. Medicines are important in reducing the workload of your heart, slowing the progression of heart failure, and improving your symptoms.  Do not stop taking your medicine unless directed by your health care provider.  Do not skip any dose of medicine.  Refill your prescriptions before you run out of  medicine. Your medicines are needed every day.  Engage in moderate physical activity if directed by your health care provider. Moderate physical activity can benefit some people. The elderly and people with severe heart failure should consult with a health care provider for physical activity recommendations.  Eat heart-healthy foods. Food choices should be free of trans fat and low in saturated fat, cholesterol, and salt (sodium). Healthy choices include fresh or frozen fruits and vegetables, fish, lean meats, legumes, fat-free or low-fat dairy products, and whole grain or high fiber foods. Talk to a dietitian to learn more about heart-healthy foods.  Limit sodium if directed by your health care provider. Sodium restriction may reduce symptoms of heart failure in some people. Talk to a dietitian to learn more about heart-healthy seasonings.  Use healthy cooking methods. Healthy cooking methods include roasting, grilling, broiling, baking, poaching, steaming, or stir-frying. Talk to a dietitian to learn more about healthy cooking methods.  Limit fluids if directed by your health care provider. Fluid restriction may reduce symptoms of heart failure in some people.  Weigh yourself every day. Daily weights are important in the early recognition of excess fluid. You should weigh yourself every morning after you urinate and before you eat breakfast. Wear the same amount of clothing each time you weigh yourself. Record your daily weight. Provide your health care provider with your weight record.  Monitor and record your blood pressure if directed by your health care provider.  Check your pulse if directed by your health care provider.  Lose weight if directed by your health care provider. Weight loss may reduce symptoms of heart failure in some people.  Stop smoking or chewing tobacco. Nicotine makes your heart work harder by causing your blood vessels to constrict. Do not use nicotine gum or patches  before talking to your health care provider.  Keep all follow-up visits as directed by your health care provider. This is important.  Limit alcohol intake to no more than 1 drink per day for nonpregnant women and 2 drinks per day for men. One drink equals 12 ounces of beer, 5 ounces of wine, or 1 ounces of hard liquor. Drinking more than that is harmful to your heart. Tell your health care provider if you drink alcohol several times a week. Talk with your health care provider about whether alcohol is safe for you. If your heart has already been damaged by alcohol or you have severe heart failure, drinking alcohol should be stopped completely.  Stop illicit drug use.  Stay up-to-date with immunizations. It is especially important to prevent respiratory infections through current pneumococcal and influenza immunizations.  Manage other health conditions such as hypertension, diabetes, thyroid disease, or abnormal heart rhythms as directed by your health care provider.  Learn to manage stress.  Plan rest periods when fatigued.  Learn strategies to manage high temperatures. If the weather is extremely hot:  Avoid vigorous physical activity.  Use air conditioning or fans or seek a cooler location.  Avoid caffeine and alcohol.  Wear loose-fitting, lightweight, and light-colored clothing.  Learn strategies to manage cold temperatures. If the weather is extremely cold:  Avoid vigorous physical activity.  Layer clothes.  Wear mittens or gloves, a hat, and a scarf when going outside.  Avoid alcohol.  Obtain ongoing education and support as needed.  Participate in or seek rehabilitation as needed to maintain or improve independence and quality of life. SEEK MEDICAL CARE IF:   Your weight increases by 03 lb/1.4 kg in 1 day or 05 lb/2.3 kg in a week.  You have increasing shortness of breath that is unusual for you.  You are unable to participate in your usual physical  activities.  You tire easily.  You cough more than normal, especially with physical activity.  You have any or more swelling in areas such as your hands, feet, ankles, or abdomen.  You are unable to sleep because it is hard to breathe.  You feel like your heart is beating fast (palpitations).  You become dizzy or light-headed upon standing up. SEEK IMMEDIATE MEDICAL CARE IF:   You have difficulty breathing.  There is a change in mental status such as decreased alertness or difficulty with concentration.  You have a pain or discomfort in your chest.  You have an episode of fainting (syncope). MAKE SURE YOU:   Understand these instructions.  Will watch your condition.  Will get help right away if you are not doing well or get worse. Document Released: 05/01/2005 Document Revised: 09/15/2013 Document Reviewed: 05/31/2012 [facility] Patient Information 2015 [place], [place]. This information is not intended to replace advice given to you by your health care provider. Make sure you discuss any questions you have with your health care provider.

## 2014-01-07 NOTE — ED Notes (Signed)
Generalized, central chest pressure that started last night with nausea. Has not gone away and has not worsened. Pt reports constant pressure.

## 2014-01-14 ENCOUNTER — Encounter: Payer: Self-pay | Admitting: Cardiology

## 2014-01-14 ENCOUNTER — Ambulatory Visit (INDEPENDENT_AMBULATORY_CARE_PROVIDER_SITE_OTHER): Payer: Medicare Other | Admitting: Cardiology

## 2014-01-14 VITALS — BP 109/75 | HR 81 | Ht 63.0 in | Wt 119.0 lb

## 2014-01-14 DIAGNOSIS — R0602 Shortness of breath: Secondary | ICD-10-CM

## 2014-01-14 DIAGNOSIS — I5033 Acute on chronic diastolic (congestive) heart failure: Secondary | ICD-10-CM

## 2014-01-14 DIAGNOSIS — I5032 Chronic diastolic (congestive) heart failure: Secondary | ICD-10-CM | POA: Insufficient documentation

## 2014-01-14 DIAGNOSIS — I4891 Unspecified atrial fibrillation: Secondary | ICD-10-CM

## 2014-01-14 MED ORDER — FUROSEMIDE 20 MG PO TABS: 20.0000 mg | ORAL_TABLET | Freq: Every day | ORAL | Status: DC

## 2014-01-14 MED ORDER — POTASSIUM CHLORIDE ER 10 MEQ PO TBCR: 10.0000 meq | EXTENDED_RELEASE_TABLET | Freq: Every day | ORAL | Status: DC

## 2014-01-14 NOTE — Assessment & Plan Note (Signed)
Continue strategy of heart rate control and anticoagulation.

## 2014-01-14 NOTE — Assessment & Plan Note (Signed)
Suspected cause of her symptoms outlined above. She is feeling better on low-dose diuretic which will be continued along with potassium supplements. We will obtain an echocardiogram to ensure no other major change in cardiac function since last year, at that time she had restrictive diastolic physiology with preserved LVEF. Office followup arranged with [facility].

## 2014-01-14 NOTE — Progress Notes (Signed)
Clinical Summary Natalie Carlson is an 80 y.o.female last seen in January. Chart review finds recent ER evaluation on August 26 with chest pain. Chest x-ray reported emphysema with coarse interstitial markings and no acute process, stable findings. Lab work revealed hemoglobin 14.7, platelets 290, potassium 4.2, BUN 14, creatinine 0.6, troponin I normal, pro-BNP 739.  She tells me that she was not actually experiencing chest pain, more a feeling of shortness of breath and orthopnea, also leg edema. The symptoms had been present over about a week. Not entirely clear that her dietary sodium or fluid intake had changed. She has been on Lasix since evaluation in the ER, feels better.  Echocardiogram from November 2014 reported LVEF 60-65% with restrictive diastolic physiology, moderate mitral regurgitation, moderate left atrial enlargement, mildly dilated RV, moderate tricuspid regurgitation with PASP 35 mm mercury.  Exercise Myoview from March 2011 done at [facility] showed no diagnostic ST segment changes, normal perfusion with LVEF 79%.   Allergies  Allergen Reactions  . Doxycycline Swelling    SEVERE FACIAL SWELLING AND BURNING  . Entocort Ec [Budesonide]     Patient saw her PCP, Dr. Eula Fried and he made Dr. Laural Golden aware that he had d/c patient taking Entocort due to the side effects she was experiencing. SEVERE FACIAL BURNING  . Neurontin [Gabapentin] Swelling    Per the patient she also has itching  . Vicodin [Hydrocodone-Acetaminophen] Rash    Rash,Redness, and itching  . Adhesive [Tape] Other (See Comments)    Took skin off  . Chocolate Other (See Comments)    Causes migraines  . Codeine     REACTION: nausea  . Morphine And Related Nausea Only  . Penicillins Other (See Comments)    Childhood allergy  . Prednisone     Itching   . Uncoded Nonscreenable Allergen Other (See Comments)    rasberry   headaches  . Naprosyn [Naproxen] Other (See Comments)    flush  . Tramadol Itching     Current Outpatient Prescriptions  Medication Sig Dispense Refill  . acetaminophen (TYLENOL) 325 MG tablet Take 325 mg by mouth every 6 (six) hours as needed. pain      . ALPRAZolam (XANAX) 0.5 MG tablet Take 0.5 mg by mouth every 8 (eight) hours as needed for anxiety.      . diclofenac sodium (VOLTAREN) 1 % GEL Apply 1 application topically 2 (two) times daily as needed. For neck pain      . DILTIAZEM CD 180 MG 24 hr capsule Take 1 capsule by mouth daily.      . furosemide (LASIX) 20 MG tablet Take 1 tablet (20 mg total) by mouth daily.  30 tablet  6  . mesalamine (APRISO) 0.375 G 24 hr capsule Take 0.75 mg by mouth 2 (two) times daily.       . Multiple Vitamins-Minerals (HAIR/SKIN/NAILS) TABS Take 1 tablet by mouth daily.      . Omega-3 Fatty Acids (FISH OIL) 1200 MG CAPS Take 1,200 mg by mouth daily.      . potassium chloride (K-DUR) 10 MEQ tablet Take 1 tablet (10 mEq total) by mouth daily.  30 tablet  6  . warfarin (COUMADIN) 5 MG tablet Take 5-7.5 mg by mouth daily. Patient takes 21m on Monday & Friday and 7.566mall other days       No current facility-administered medications for this visit.    Past Medical History  Diagnosis Date  . Hypothyroidism   . Cataract of both eyes   .  Mucus in stool   . Chronic diarrhea   . Hyperlipidemia   . Colitis   . History of colon polyps   . History of migraines   . Arthritis   . Chronic back pain   . Hemorrhoids   . Macular degeneration, dry   . Adenocarcinoma of lung     Stage  1A  EGFR positive  ALK-negative   . Atrial fibrillation   . Nephrolithiasis     1994 - passed on its on    Social History Ms. Creasman reports that she has never smoked. She has never used smokeless tobacco. Ms. Skoczylas reports that she does not drink alcohol.  Review of Systems No palpitations or exertional chest pain. No syncope. No bleeding problems. Other systems reviewed and negative except as outlined.  Physical Examination Filed Vitals:    01/14/14 1337  BP: 109/75  Pulse: 81   Filed Weights   01/14/14 1337  Weight: 119 lb (53.978 kg)    No acute distress.  HEENT: Conjunctiva and lids normal, oropharynx clear.  Neck: Supple, prominent superficial veins on the left side of the base of the neck, also strap muscles of the neck easily palpable. No pain in this area. Questionable carotid bruit  Lungs: Diminished but cleaar to auscultation, nonlabored breathing at rest.  Cardiac: Irregularly irregular, no S3 or significant systolic murmur, no pericardial rub.  Abdomen: Soft, nontender, bowel sounds present.  Extremities: Trace edema, distal pulses 2+.  Skin: Warm and dry.  Musculoskeletal: No kyphosis.  Neuropsychiatric: Alert and oriented x3, affect grossly appropriate.   Problem List and Plan   Atrial fibrillation Continue strategy of heart rate control and anticoagulation.  Acute on chronic diastolic heart failure Suspected cause of her symptoms outlined above. She is feeling better on low-dose diuretic which will be continued along with potassium supplements. We will obtain an echocardiogram to ensure no other major change in cardiac function since last year, at that time she had restrictive diastolic physiology with preserved LVEF. Office followup arranged with [facility].    Satira Sark, M.D., F.A.C.C.

## 2014-01-14 NOTE — Patient Instructions (Signed)
Your physician recommends that you schedule a follow-up appointment in: 3 months. Your physician has recommended you make the following change in your medication:  Change potassium chloride to 10 meq daily. Continue all other medications the same. Your physician recommends that you have lab work in 3 months just before your next visit. Your physician has requested that you have an echocardiogram. Echocardiography is a painless test that uses sound waves to create images of your heart. It provides your doctor with information about the size and shape of your heart and how well your heart's chambers and valves are working. This procedure takes approximately one hour. There are no restrictions for this procedure.

## 2014-01-20 ENCOUNTER — Ambulatory Visit (INDEPENDENT_AMBULATORY_CARE_PROVIDER_SITE_OTHER): Payer: Medicare Other | Admitting: *Deleted

## 2014-01-20 DIAGNOSIS — Z5181 Encounter for therapeutic drug level monitoring: Secondary | ICD-10-CM

## 2014-01-20 DIAGNOSIS — Z7901 Long term (current) use of anticoagulants: Secondary | ICD-10-CM

## 2014-01-20 DIAGNOSIS — I4891 Unspecified atrial fibrillation: Secondary | ICD-10-CM

## 2014-01-20 LAB — POCT INR: INR: 2.6

## 2014-01-27 ENCOUNTER — Other Ambulatory Visit: Payer: Self-pay | Admitting: *Deleted

## 2014-01-27 DIAGNOSIS — C3411 Malignant neoplasm of upper lobe, right bronchus or lung: Secondary | ICD-10-CM

## 2014-02-04 ENCOUNTER — Other Ambulatory Visit: Payer: Self-pay

## 2014-02-04 ENCOUNTER — Other Ambulatory Visit (INDEPENDENT_AMBULATORY_CARE_PROVIDER_SITE_OTHER): Payer: Medicare Other

## 2014-02-04 DIAGNOSIS — I4891 Unspecified atrial fibrillation: Secondary | ICD-10-CM

## 2014-02-04 DIAGNOSIS — I5033 Acute on chronic diastolic (congestive) heart failure: Secondary | ICD-10-CM

## 2014-02-04 DIAGNOSIS — R0602 Shortness of breath: Secondary | ICD-10-CM

## 2014-02-04 DIAGNOSIS — I059 Rheumatic mitral valve disease, unspecified: Secondary | ICD-10-CM

## 2014-02-06 ENCOUNTER — Telehealth: Payer: Self-pay | Admitting: *Deleted

## 2014-02-06 NOTE — Telephone Encounter (Signed)
Message copied by Merlene Laughter on Fri Feb 06, 2014  4:15 PM ------      Message from: MCDOWELL, Aloha Gell      Created: Wed Feb 04, 2014  3:59 PM       Reviewed. Please let her know that overall heart findings are stable by echocardiogram. No deterioration in LVEF or valvular status. Continue present regimen including diuretic. ------

## 2014-02-06 NOTE — Telephone Encounter (Signed)
Patient informed. 

## 2014-02-25 ENCOUNTER — Ambulatory Visit (INDEPENDENT_AMBULATORY_CARE_PROVIDER_SITE_OTHER): Payer: Medicare Other | Admitting: Internal Medicine

## 2014-02-26 ENCOUNTER — Ambulatory Visit
Admission: RE | Admit: 2014-02-26 | Discharge: 2014-02-26 | Disposition: A | Discharge status: Home / Self Care | Payer: Medicare Other | Source: Ambulatory Visit | Attending: Cardiothoracic Surgery | Admitting: Cardiothoracic Surgery

## 2014-02-26 ENCOUNTER — Encounter (INDEPENDENT_AMBULATORY_CARE_PROVIDER_SITE_OTHER): Payer: Self-pay

## 2014-02-26 ENCOUNTER — Encounter: Payer: Self-pay | Admitting: Cardiothoracic Surgery

## 2014-02-26 ENCOUNTER — Ambulatory Visit (INDEPENDENT_AMBULATORY_CARE_PROVIDER_SITE_OTHER): Payer: Medicare Other | Admitting: Cardiothoracic Surgery

## 2014-02-26 VITALS — BP 130/80 | HR 72 | Resp 20 | Ht 63.0 in | Wt 119.0 lb

## 2014-02-26 DIAGNOSIS — Z902 Acquired absence of lung [part of]: Secondary | ICD-10-CM

## 2014-02-26 DIAGNOSIS — Z85118 Personal history of other malignant neoplasm of bronchus and lung: Secondary | ICD-10-CM

## 2014-02-26 DIAGNOSIS — C3411 Malignant neoplasm of upper lobe, right bronchus or lung: Secondary | ICD-10-CM

## 2014-02-26 DIAGNOSIS — Z9889 Other specified postprocedural states: Secondary | ICD-10-CM

## 2014-02-26 NOTE — Progress Notes (Signed)
[address]       [place] [address]             [phone]                            Kasiya Y Munger [place] Medical Record #[id number] Date of Birth: 1930/03/14  [name], MD [name], MD  Chief Complaint:   PostOp Follow Up Visit PROCEDURE PERFORMED 05/31/2011: Bronchoscopy, right video-assisted thoracoscopy,  right upper lobe lung biopsy with right upper lobectomy and lymph node  Dissection.   adenocarcinoma the right upper lobe pT1b, pN0, cM0   EGFR positive   Adenocarcinoma of lung   Primary site: Lung (Right)   Pathologic: (T1b, N0, cM0)   Summary: (T1b, N0, cM0)  History of Present Illness:      Patient returns today for followup Ct of chest and visit after right upper lobectomy done 05/31/2011 for adenocarcinoma the right upper lobe pT1b, pN0   EGFR positive and ALK negative.  Patient originally saw Dr. Sonny Dandy, but currently her only followup oncology visits are here. She notes some pedal edam since last vist , no on lasix. She notes echo cardiogram done by Dr Conni Elliot To get flu shot next week   History  Smoking status  . Never Smoker   Smokeless tobacco  . Never Used     Allergies  Allergen Reactions  . Doxycycline Swelling    SEVERE FACIAL SWELLING AND BURNING  . Entocort Ec [Budesonide]     Patient saw her PCP, Dr. Eula Fried and he made Dr. Laural Golden aware that he had d/c patient taking Entocort due to the side effects she was experiencing. SEVERE FACIAL BURNING  . Neurontin [Gabapentin] Swelling    Per the patient she also has itching  . Vicodin [Hydrocodone-Acetaminophen] Rash    Rash,Redness, and itching  . Adhesive [Tape] Other (See Comments)    Took skin off  . Chocolate Other (See Comments)    Causes migraines  . Codeine     REACTION: nausea  . Morphine And Related Nausea Only  . Penicillins Other (See Comments)    Childhood allergy  . Prednisone     Itching   . Uncoded Nonscreenable Allergen Other (See  Comments)    rasberry   headaches  . Naprosyn [Naproxen] Other (See Comments)    flush  . Tramadol Itching    Current Outpatient Prescriptions  Medication Sig Dispense Refill  . acetaminophen (TYLENOL) 325 MG tablet Take 325 mg by mouth every 6 (six) hours as needed. pain      . ALPRAZolam (XANAX) 0.5 MG tablet Take 0.5 mg by mouth every 8 (eight) hours as needed for anxiety.      . diclofenac sodium (VOLTAREN) 1 % GEL Apply 1 application topically 2 (two) times daily as needed. For neck pain      . DILTIAZEM CD 180 MG 24 hr capsule Take 1 capsule by mouth daily.      . furosemide (LASIX) 20 MG tablet Take 1 tablet (20 mg total) by mouth daily.  30 tablet  6  . mesalamine (APRISO) 0.375 G 24 hr capsule Take 0.75 mg by mouth 2 (two) times daily.       . Multiple Vitamins-Minerals (HAIR/SKIN/NAILS) TABS Take 1 tablet by mouth daily.      . Omega-3 Fatty Acids (FISH OIL) 1200 MG CAPS Take 1,200 mg by mouth daily.      Marland Kitchen  potassium chloride (K-DUR) 10 MEQ tablet Take 1 tablet (10 mEq total) by mouth daily.  30 tablet  6  . warfarin (COUMADIN) 5 MG tablet Take 5-7.5 mg by mouth daily. Patient takes 80m on Monday & Friday and 7.591mall other days       No current facility-administered medications for this visit.    Physical Exam: There were no vitals taken for this visit.  General appearance: alert, cooperative, appears stated age and no distress Neurologic: intact Heart: irregularly irregular rhythm Lungs: clear to auscultation bilaterally, there is no wheezing on physical exam Abdomen: soft, non-tender; bowel sounds normal; no masses,  no organomegaly Extremities: extremities normal, atraumatic, no cyanosis or edema and Homans sign is negative, no sign of DVT Wound: Right chest incisions are well-healed  He has no cervical or supraclavicular adenopathy no axillary adenopathy, she does have a prominent left external jugular vein.  Diagnostic Studies & Laboratory data:         Recent  Radiology Findings: Ct Chest Wo Contrast  02/26/2014   CLINICAL DATA:  Followup right upper lobe lung adenocarcinoma. Status post right upper lobectomy. Shortness of breath.  EXAM: CT CHEST WITHOUT CONTRAST  TECHNIQUE: Multidetector CT imaging of the chest was performed following the standard protocol without IV contrast.  COMPARISON:  08/28/2013  FINDINGS: Mediastinum/Hilar Regions: No masses or pathologically enlarged lymph nodes identified.  Other Thoracic Lymphadenopathy:  None.  Lungs: Postsurgical changes in the right upper lung field remains stable. Multiple tiny diffusely scattered bilateral subcentimeter pulmonary nodules remain stable. No new or enlarging pulmonary nodules or masses identified. Scarring in the left lower lobe is also unchanged.  Pleura:  No evidence of effusion or mass.  Vascular/Cardiac:  No acute findings identified.  Other: Mild-to-moderate goiter remains stable. Tiny nonobstructive left renal calculus is also stable. Both adrenal glands are normal in appearance.  Musculoskeletal:  No suspicious bone lesions identified.  IMPRESSION: Stable exam. No evidence of recurrent or metastatic carcinoma within the thorax.   Electronically Signed   By: JoEarle Gell.D.   On: 02/26/2014 08:33   Ct Super D Chest Wo Contrast  08/28/2013   CLINICAL DATA:  Right lung mass. Right lung resection in 2013. Nonsmoker.  EXAM: CT CHEST WITHOUT CONTRAST  TECHNIQUE: Multidetector CT imaging of the chest was performed using thin slice collimation for electromagnetic bronchoscopy planning purposes, without intravenous contrast.  COMPARISON:  Plain film 08/28/2013.  CT 02/27/2013.  FINDINGS: Lungs/Pleura:  Surgical changes of right upper lobectomy.  Mild thickening along the right sided fissure is not significantly changed, including on image 33.  Subpleural right lower lobe density on image 41 is similar.  Right lower lobe calcified granuloma, image 34.  A medial right lower lobe 3 mm nodule on image 48 is  not readily apparent on prior exams. This may be due to differences in slice selection.  Left base scarring. Mild left upper lobe subpleural nodularity on image 33 is similar.  No pleural fluid.  Heart/Mediastinum: Diffuse thyroid enlargement which is unchanged. No well-defined dominant mass.  Tortuous descending thoracic aorta. Heart mildly enlarged and accentuated by pectus excavatum deformity. No mediastinal or definite hilar adenopathy, given limitations of unenhanced CT.  Upper Abdomen:  Normal adrenal glands.  Bones/Musculoskeletal: Moderate osteopenia. Degenerative disc disease at the L1-2 level.  IMPRESSION: 1. Surgical changes of right upper lobectomy. No evidence of locally recurrent or metastatic disease. 2. Scattered tiny pulmonary nodules, primarily similar. A possibly new right lower lobe nodule may have been  obscured on prior exam secondary to slice selection. Recommend attention on follow-up.   Electronically Signed   By: Abigail Miyamoto M.D.   On: 08/28/2013 12:34    Recent Labs: Lab Results  Component Value Date   WBC 6.7 01/07/2014   HGB 14.7 01/07/2014   HCT 43.3 01/07/2014   PLT 290 01/07/2014   GLUCOSE 100* 01/07/2014   ALT 17 06/02/2011   AST 28 06/02/2011   NA 141 01/07/2014   K 4.2 01/07/2014   CL 102 01/07/2014   CREATININE 0.68 01/07/2014   BUN 14 01/07/2014   CO2 29 01/07/2014   INR 2.6 01/20/2014      Assessment / Plan:   No evidence of recurrent Lung CA on exam or CT of Chest today now about  21 month  following surgical resection of the right upper lobe for EGRF positive well-differentiated adenocarcinoma 2.2 cm in size.   Retuen 12 months with repet ct of the chest   Grace Isaac MD 02/26/2014 9:15 AM

## 2014-03-03 ENCOUNTER — Ambulatory Visit (INDEPENDENT_AMBULATORY_CARE_PROVIDER_SITE_OTHER): Payer: Medicare Other | Admitting: *Deleted

## 2014-03-03 DIAGNOSIS — Z5181 Encounter for therapeutic drug level monitoring: Secondary | ICD-10-CM

## 2014-03-03 DIAGNOSIS — I4891 Unspecified atrial fibrillation: Secondary | ICD-10-CM

## 2014-03-03 DIAGNOSIS — Z7901 Long term (current) use of anticoagulants: Secondary | ICD-10-CM

## 2014-03-03 LAB — POCT INR: INR: 2.8

## 2014-04-14 ENCOUNTER — Ambulatory Visit (INDEPENDENT_AMBULATORY_CARE_PROVIDER_SITE_OTHER): Payer: Medicare Other | Admitting: *Deleted

## 2014-04-14 DIAGNOSIS — Z5181 Encounter for therapeutic drug level monitoring: Secondary | ICD-10-CM

## 2014-04-14 DIAGNOSIS — I4891 Unspecified atrial fibrillation: Secondary | ICD-10-CM

## 2014-04-14 DIAGNOSIS — Z7901 Long term (current) use of anticoagulants: Secondary | ICD-10-CM

## 2014-04-14 LAB — POCT INR: INR: 3.1

## 2014-04-21 ENCOUNTER — Ambulatory Visit: Payer: Medicare Other | Admitting: Cardiology

## 2014-04-28 ENCOUNTER — Ambulatory Visit (INDEPENDENT_AMBULATORY_CARE_PROVIDER_SITE_OTHER): Payer: Medicare Other | Admitting: Internal Medicine

## 2014-05-04 ENCOUNTER — Ambulatory Visit (INDEPENDENT_AMBULATORY_CARE_PROVIDER_SITE_OTHER): Payer: Medicare Other | Admitting: Internal Medicine

## 2014-05-04 ENCOUNTER — Encounter (INDEPENDENT_AMBULATORY_CARE_PROVIDER_SITE_OTHER): Payer: Self-pay | Admitting: Internal Medicine

## 2014-05-04 VITALS — BP 112/80 | HR 72 | Temp 97.0°F | Resp 16 | Ht 63.0 in | Wt 120.8 lb

## 2014-05-04 DIAGNOSIS — K50919 Crohn's disease, unspecified, with unspecified complications: Secondary | ICD-10-CM

## 2014-05-04 MED ORDER — MESALAMINE 400 MG PO CPDR: 800.0000 mg | DELAYED_RELEASE_CAPSULE | Freq: Two times a day (BID) | ORAL | Status: DC

## 2014-05-04 NOTE — Progress Notes (Signed)
Presenting complaint;  Follow-up for inflammatory bowel disease.  Subjective:  Patient is a 80 year old Caucasian female who has history of Crohn's colitis and is here for scheduled visit. She was last seen in April this year. She is on oral mesalamine which she is not able to afford and receiving samples as much as possible. On most days she has 1 formed stool daily. She denies melena or rectal bleeding. However she continues to have rectal discharge. She passes clear to yellowish liquid. She is using 1-3 pads per day. She denies abdominal pain nausea or vomiting. She has very good appetite. She states she eats fiber rich foods daily. He was recently treated for bronchitis with antibiotics and did not experience any GI side effects. She is not having pain and right lower quadrant anymore. She is not having pain in left flank left hip and proximal left thigh. Pain is worse when she moves. Earlier this year she was seen by Dr. Joya Salm. She received 4 steroid injections to her back. She received significant relief of pain with first 3 injections and not the fourth one. Now the pain has migrated to the left side. She has an appointment to see chiropractor and her neurosurgeon in near future.   Current Medications: Outpatient Encounter Prescriptions as of 05/04/2014  Medication Sig  . acetaminophen (TYLENOL) 325 MG tablet Take 325 mg by mouth every 6 (six) hours as needed. pain  . ALPRAZolam (XANAX) 0.5 MG tablet Take 0.5 mg by mouth every 8 (eight) hours as needed for anxiety.  . diclofenac sodium (VOLTAREN) 1 % GEL Apply 1 application topically 2 (two) times daily as needed. For neck pain  . DILTIAZEM CD 180 MG 24 hr capsule Take 1 capsule by mouth daily.  . furosemide (LASIX) 20 MG tablet Take 1 tablet (20 mg total) by mouth daily.  . mesalamine (APRISO) 0.375 G 24 hr capsule Take 0.75 mg by mouth 2 (two) times daily.   . Multiple Vitamins-Minerals (HAIR/SKIN/NAILS) TABS Take 1 tablet by mouth  daily.  . Omega-3 Fatty Acids (FISH OIL) 1200 MG CAPS Take 1,200 mg by mouth daily.  . potassium chloride (K-DUR) 10 MEQ tablet Take 1 tablet (10 mEq total) by mouth daily.  Marland Kitchen warfarin (COUMADIN) 5 MG tablet Take 5-7.5 mg by mouth daily. Patient takes 6m on Monday & Friday and 7.520mall other days     Objective: Blood pressure 112/80, pulse 72, temperature 97 F (36.1 C), temperature source Oral, resp. rate 16, height 5' 3"  (1.6 m), weight 120 lb 12.8 oz (54.795 kg). Patient is alert. She seems to be in pain when she moves from chair to examination table. Conjunctiva is pink. Sclera is nonicteric Oropharyngeal mucosa is normal. No neck masses or thyromegaly noted. Cardiac exam with irregular rhythm normal S1 and S2. No murmur or gallop noted. Lungs are clear to auscultation. Abdomen S4. Bowel sounds are normal. On palpation abdomen is soft and nontender. No organomegaly or masses.  No LE edema or clubbing noted.   Assessment:  #1. Patchy colitis with distribution suggestive of Crohn's disease. Diarrhea and rectal bleeding has resolved but she is still passing mucus per rectum. Hopefully with continued oral mesalamine this symptom will resolve. #2. Acute on chronic pain secondary to DJD of lumbar spine with evidence of nerve impingement. Patient is aware that she cannot take oral NSAIDs.   Plan:  Will switch her to Delzicol 800 mg by mouth twice a day since we do not have samples of Apriso. Office  visit in 6 months.

## 2014-05-04 NOTE — Patient Instructions (Signed)
Delzicol 800 mg by mouth twice daily until Apriso available.

## 2014-05-18 ENCOUNTER — Encounter: Payer: Self-pay | Admitting: Cardiology

## 2014-05-18 ENCOUNTER — Ambulatory Visit (INDEPENDENT_AMBULATORY_CARE_PROVIDER_SITE_OTHER): Payer: Medicare Other | Admitting: Cardiology

## 2014-05-18 VITALS — BP 104/69 | HR 83 | Ht 63.0 in | Wt 121.0 lb

## 2014-05-18 DIAGNOSIS — M47816 Spondylosis without myelopathy or radiculopathy, lumbar region: Secondary | ICD-10-CM | POA: Diagnosis not present

## 2014-05-18 DIAGNOSIS — I5032 Chronic diastolic (congestive) heart failure: Secondary | ICD-10-CM

## 2014-05-18 DIAGNOSIS — I4891 Unspecified atrial fibrillation: Secondary | ICD-10-CM

## 2014-05-18 DIAGNOSIS — M9903 Segmental and somatic dysfunction of lumbar region: Secondary | ICD-10-CM | POA: Diagnosis not present

## 2014-05-18 DIAGNOSIS — M5116 Intervertebral disc disorders with radiculopathy, lumbar region: Secondary | ICD-10-CM | POA: Diagnosis not present

## 2014-05-18 NOTE — Assessment & Plan Note (Signed)
Symptomatically stable, no leg edema on Lasix. Recent lab work reviewed above. Continue observation.

## 2014-05-18 NOTE — Patient Instructions (Signed)
Continue all current medications. Your physician wants you to follow up in: 6 months.  You will receive a reminder letter in the mail one-two months in advance.  If you don't receive a letter, please call our office to schedule the follow up appointment   

## 2014-05-18 NOTE — Progress Notes (Signed)
Reason for visit: Atrial fibrillation  Clinical Summary Natalie Carlson is an 81 y.o.female last seen in September 2015. She continues to do well without progressing sense of palpitations, no chest pain or unusual shortness of breath. She continues on Cardizem CD and Coumadin, no bleeding problems. INR 3.1 back in December 2015.  Recent follow-up lab work showed BUN 22, creatinine 0.6, and potassium 4.9. She reports no leg edema. Weight is stable.  Echocardiogram from September 2015 showed LVEF 60-65% with indeterminate diastolic function in the setting of atrial fibrillation/flutter, MAC with mild mitral regurgitation, mild to moderate RV enlargement with normal contraction, mild to moderate tricuspid regurgitation with PASP 34 mmHg.   Allergies  Allergen Reactions  . Doxycycline Swelling    SEVERE FACIAL SWELLING AND BURNING  . Entocort Ec [Budesonide]     Patient saw her PCP, Dr. Eula Fried and he made Dr. Laural Golden aware that he had d/c patient taking Entocort due to the side effects she was experiencing. SEVERE FACIAL BURNING  . Neurontin [Gabapentin] Swelling    Per the patient she also has itching  . Vicodin [Hydrocodone-Acetaminophen] Rash    Rash,Redness, and itching  . Adhesive [Tape] Other (See Comments)    Took skin off  . Chocolate Other (See Comments)    Causes migraines  . Codeine     REACTION: nausea  . Morphine And Related Nausea Only  . Penicillins Other (See Comments)    Childhood allergy  . Prednisone     Itching   . Uncoded Nonscreenable Allergen Other (See Comments)    rasberry   headaches  . Amoxicillin Itching and Rash  . Levaquin [Levofloxacin In D5w] Itching and Rash  . Naprosyn [Naproxen] Other (See Comments)    flush  . Tramadol Itching    Current Outpatient Prescriptions  Medication Sig Dispense Refill  . acetaminophen (TYLENOL) 325 MG tablet Take 325 mg by mouth every 6 (six) hours as needed. pain    . ALPRAZolam (XANAX) 0.5 MG tablet Take 0.5 mg by  mouth every 8 (eight) hours as needed for anxiety.    . diclofenac sodium (VOLTAREN) 1 % GEL Apply 1 application topically 2 (two) times daily as needed. For neck pain    . DILTIAZEM CD 180 MG 24 hr capsule Take 1 capsule by mouth daily.    . furosemide (LASIX) 20 MG tablet Take 1 tablet (20 mg total) by mouth daily. 30 tablet 6  . Mesalamine (ASACOL) 400 MG CPDR DR capsule Take 2 capsules (800 mg total) by mouth 2 (two) times daily. 72 capsule 0  . Multiple Vitamins-Minerals (HAIR/SKIN/NAILS) TABS Take 1 tablet by mouth daily.    . Omega-3 Fatty Acids (FISH OIL) 1200 MG CAPS Take 1,200 mg by mouth daily.    . potassium chloride (K-DUR) 10 MEQ tablet Take 1 tablet (10 mEq total) by mouth daily. 30 tablet 6  . warfarin (COUMADIN) 5 MG tablet Take 5-7.5 mg by mouth daily. Patient takes 71m on Monday & Friday and 7.565mall other days     No current facility-administered medications for this visit.    Past Medical History  Diagnosis Date  . Hypothyroidism   . Cataract of both eyes   . Mucus in stool   . Chronic diarrhea   . Hyperlipidemia   . Colitis   . History of colon polyps   . History of migraines   . Arthritis   . Chronic back pain   . Hemorrhoids   . Macular degeneration, dry   .  Adenocarcinoma of lung     Stage  1A  EGFR positive  ALK-negative   . Atrial fibrillation   . Nephrolithiasis     1994 - passed on its on    Social History Natalie Carlson reports that she has never smoked. She has never used smokeless tobacco. Natalie Carlson reports that she does not drink alcohol.  Review of Systems Complete review of systems negative except as otherwise outlined in the clinical summary and also the following. Stable appetite. No orthopnea or PND. Lower back and leg pain due to arthritis.  Physical Examination Filed Vitals:   05/18/14 1422  BP: 104/69  Pulse: 83   Filed Weights   05/18/14 1422  Weight: 121 lb (54.885 kg)    No acute distress.  HEENT: Conjunctiva and lids  normal, oropharynx clear.  Neck: Supple, prominent superficial veins on the left side of the base of the neck, also strap muscles of the neck easily palpable. No pain in this area. Questionable carotid bruit  Lungs: Diminished but cleaar to auscultation, nonlabored breathing at rest.  Cardiac: Irregularly irregular, no S3 or significant systolic murmur, no pericardial rub.  Abdomen: Soft, nontender, bowel sounds present.  Extremities: Trace edema, distal pulses 2+.  Skin: Warm and dry.  Musculoskeletal: No kyphosis.  Neuropsychiatric: Alert and oriented x3, affect grossly appropriate.   Problem List and Plan   Atrial fibrillation Symptomatically stable. Continue Cardizem CD and Coumadin. Follow-up 6 months.  Chronic diastolic heart failure Symptomatically stable, no leg edema on Lasix. Recent lab work reviewed above. Continue observation.    Satira Sark, M.D., F.A.C.C.

## 2014-05-18 NOTE — Assessment & Plan Note (Signed)
Symptomatically stable. Continue Cardizem CD and Coumadin. Follow-up 6 months.

## 2014-05-20 DIAGNOSIS — M47816 Spondylosis without myelopathy or radiculopathy, lumbar region: Secondary | ICD-10-CM | POA: Diagnosis not present

## 2014-05-20 DIAGNOSIS — M5116 Intervertebral disc disorders with radiculopathy, lumbar region: Secondary | ICD-10-CM | POA: Diagnosis not present

## 2014-05-20 DIAGNOSIS — M9903 Segmental and somatic dysfunction of lumbar region: Secondary | ICD-10-CM | POA: Diagnosis not present

## 2014-05-22 DIAGNOSIS — M9903 Segmental and somatic dysfunction of lumbar region: Secondary | ICD-10-CM | POA: Diagnosis not present

## 2014-05-22 DIAGNOSIS — M5116 Intervertebral disc disorders with radiculopathy, lumbar region: Secondary | ICD-10-CM | POA: Diagnosis not present

## 2014-05-22 DIAGNOSIS — M47816 Spondylosis without myelopathy or radiculopathy, lumbar region: Secondary | ICD-10-CM | POA: Diagnosis not present

## 2014-05-25 DIAGNOSIS — M47816 Spondylosis without myelopathy or radiculopathy, lumbar region: Secondary | ICD-10-CM | POA: Diagnosis not present

## 2014-05-25 DIAGNOSIS — M5116 Intervertebral disc disorders with radiculopathy, lumbar region: Secondary | ICD-10-CM | POA: Diagnosis not present

## 2014-05-25 DIAGNOSIS — M9903 Segmental and somatic dysfunction of lumbar region: Secondary | ICD-10-CM | POA: Diagnosis not present

## 2014-05-26 ENCOUNTER — Ambulatory Visit (INDEPENDENT_AMBULATORY_CARE_PROVIDER_SITE_OTHER): Payer: Medicare Other | Admitting: *Deleted

## 2014-05-26 DIAGNOSIS — Z5181 Encounter for therapeutic drug level monitoring: Secondary | ICD-10-CM | POA: Diagnosis not present

## 2014-05-26 DIAGNOSIS — Z7901 Long term (current) use of anticoagulants: Secondary | ICD-10-CM

## 2014-05-26 DIAGNOSIS — I4891 Unspecified atrial fibrillation: Secondary | ICD-10-CM

## 2014-05-26 DIAGNOSIS — I480 Paroxysmal atrial fibrillation: Secondary | ICD-10-CM

## 2014-05-26 LAB — POCT INR: INR: 3.9

## 2014-05-28 DIAGNOSIS — M5116 Intervertebral disc disorders with radiculopathy, lumbar region: Secondary | ICD-10-CM | POA: Diagnosis not present

## 2014-05-28 DIAGNOSIS — M47816 Spondylosis without myelopathy or radiculopathy, lumbar region: Secondary | ICD-10-CM | POA: Diagnosis not present

## 2014-05-28 DIAGNOSIS — M9903 Segmental and somatic dysfunction of lumbar region: Secondary | ICD-10-CM | POA: Diagnosis not present

## 2014-06-01 DIAGNOSIS — M47816 Spondylosis without myelopathy or radiculopathy, lumbar region: Secondary | ICD-10-CM | POA: Diagnosis not present

## 2014-06-01 DIAGNOSIS — M9903 Segmental and somatic dysfunction of lumbar region: Secondary | ICD-10-CM | POA: Diagnosis not present

## 2014-06-01 DIAGNOSIS — M5116 Intervertebral disc disorders with radiculopathy, lumbar region: Secondary | ICD-10-CM | POA: Diagnosis not present

## 2014-06-02 DIAGNOSIS — I480 Paroxysmal atrial fibrillation: Secondary | ICD-10-CM | POA: Diagnosis not present

## 2014-06-02 DIAGNOSIS — I10 Essential (primary) hypertension: Secondary | ICD-10-CM | POA: Diagnosis not present

## 2014-06-04 DIAGNOSIS — M5116 Intervertebral disc disorders with radiculopathy, lumbar region: Secondary | ICD-10-CM | POA: Diagnosis not present

## 2014-06-04 DIAGNOSIS — M9903 Segmental and somatic dysfunction of lumbar region: Secondary | ICD-10-CM | POA: Diagnosis not present

## 2014-06-04 DIAGNOSIS — M47816 Spondylosis without myelopathy or radiculopathy, lumbar region: Secondary | ICD-10-CM | POA: Diagnosis not present

## 2014-06-10 DIAGNOSIS — M9903 Segmental and somatic dysfunction of lumbar region: Secondary | ICD-10-CM | POA: Diagnosis not present

## 2014-06-10 DIAGNOSIS — M5116 Intervertebral disc disorders with radiculopathy, lumbar region: Secondary | ICD-10-CM | POA: Diagnosis not present

## 2014-06-10 DIAGNOSIS — M47816 Spondylosis without myelopathy or radiculopathy, lumbar region: Secondary | ICD-10-CM | POA: Diagnosis not present

## 2014-06-11 ENCOUNTER — Ambulatory Visit (INDEPENDENT_AMBULATORY_CARE_PROVIDER_SITE_OTHER): Payer: Medicare Other | Admitting: *Deleted

## 2014-06-11 DIAGNOSIS — Z5181 Encounter for therapeutic drug level monitoring: Secondary | ICD-10-CM | POA: Diagnosis not present

## 2014-06-11 DIAGNOSIS — Z7901 Long term (current) use of anticoagulants: Secondary | ICD-10-CM | POA: Diagnosis not present

## 2014-06-11 DIAGNOSIS — I4891 Unspecified atrial fibrillation: Secondary | ICD-10-CM

## 2014-06-11 LAB — POCT INR: INR: 1.3

## 2014-06-12 DIAGNOSIS — H3531 Nonexudative age-related macular degeneration: Secondary | ICD-10-CM | POA: Diagnosis not present

## 2014-06-12 DIAGNOSIS — H524 Presbyopia: Secondary | ICD-10-CM | POA: Diagnosis not present

## 2014-06-12 DIAGNOSIS — Z961 Presence of intraocular lens: Secondary | ICD-10-CM | POA: Diagnosis not present

## 2014-06-16 DIAGNOSIS — L570 Actinic keratosis: Secondary | ICD-10-CM | POA: Diagnosis not present

## 2014-06-16 DIAGNOSIS — I781 Nevus, non-neoplastic: Secondary | ICD-10-CM | POA: Diagnosis not present

## 2014-06-16 DIAGNOSIS — L309 Dermatitis, unspecified: Secondary | ICD-10-CM | POA: Diagnosis not present

## 2014-06-17 DIAGNOSIS — M47816 Spondylosis without myelopathy or radiculopathy, lumbar region: Secondary | ICD-10-CM | POA: Diagnosis not present

## 2014-06-17 DIAGNOSIS — M9903 Segmental and somatic dysfunction of lumbar region: Secondary | ICD-10-CM | POA: Diagnosis not present

## 2014-06-17 DIAGNOSIS — M5116 Intervertebral disc disorders with radiculopathy, lumbar region: Secondary | ICD-10-CM | POA: Diagnosis not present

## 2014-06-18 ENCOUNTER — Telehealth: Payer: Self-pay | Admitting: Cardiology

## 2014-06-18 NOTE — Telephone Encounter (Signed)
CARTOID DOPPLERS 1 YR FU DUE - unable to reach patient by telephone, mailed a reminder recall letter to patient.

## 2014-06-23 ENCOUNTER — Ambulatory Visit (INDEPENDENT_AMBULATORY_CARE_PROVIDER_SITE_OTHER): Payer: Medicare Other | Admitting: *Deleted

## 2014-06-23 DIAGNOSIS — I480 Paroxysmal atrial fibrillation: Secondary | ICD-10-CM | POA: Diagnosis not present

## 2014-06-23 DIAGNOSIS — Z5181 Encounter for therapeutic drug level monitoring: Secondary | ICD-10-CM | POA: Diagnosis not present

## 2014-06-23 LAB — POCT INR: INR: 2.8

## 2014-06-24 DIAGNOSIS — M47816 Spondylosis without myelopathy or radiculopathy, lumbar region: Secondary | ICD-10-CM | POA: Diagnosis not present

## 2014-06-24 DIAGNOSIS — M9903 Segmental and somatic dysfunction of lumbar region: Secondary | ICD-10-CM | POA: Diagnosis not present

## 2014-06-24 DIAGNOSIS — M5116 Intervertebral disc disorders with radiculopathy, lumbar region: Secondary | ICD-10-CM | POA: Diagnosis not present

## 2014-07-02 ENCOUNTER — Other Ambulatory Visit: Payer: Self-pay | Admitting: Radiology

## 2014-07-02 DIAGNOSIS — I6523 Occlusion and stenosis of bilateral carotid arteries: Secondary | ICD-10-CM

## 2014-07-06 ENCOUNTER — Other Ambulatory Visit: Payer: Self-pay | Admitting: Cardiology

## 2014-07-09 ENCOUNTER — Encounter (INDEPENDENT_AMBULATORY_CARE_PROVIDER_SITE_OTHER): Payer: Medicare Other

## 2014-07-09 DIAGNOSIS — I6523 Occlusion and stenosis of bilateral carotid arteries: Secondary | ICD-10-CM

## 2014-07-10 DIAGNOSIS — M531 Cervicobrachial syndrome: Secondary | ICD-10-CM | POA: Diagnosis not present

## 2014-07-10 DIAGNOSIS — M47816 Spondylosis without myelopathy or radiculopathy, lumbar region: Secondary | ICD-10-CM | POA: Diagnosis not present

## 2014-07-10 DIAGNOSIS — M5136 Other intervertebral disc degeneration, lumbar region: Secondary | ICD-10-CM | POA: Diagnosis not present

## 2014-07-10 DIAGNOSIS — M9902 Segmental and somatic dysfunction of thoracic region: Secondary | ICD-10-CM | POA: Diagnosis not present

## 2014-07-10 DIAGNOSIS — S338XXA Sprain of other parts of lumbar spine and pelvis, initial encounter: Secondary | ICD-10-CM | POA: Diagnosis not present

## 2014-07-10 DIAGNOSIS — M4004 Postural kyphosis, thoracic region: Secondary | ICD-10-CM | POA: Diagnosis not present

## 2014-07-10 DIAGNOSIS — M5441 Lumbago with sciatica, right side: Secondary | ICD-10-CM | POA: Diagnosis not present

## 2014-07-10 DIAGNOSIS — M9903 Segmental and somatic dysfunction of lumbar region: Secondary | ICD-10-CM | POA: Diagnosis not present

## 2014-07-10 DIAGNOSIS — M5116 Intervertebral disc disorders with radiculopathy, lumbar region: Secondary | ICD-10-CM | POA: Diagnosis not present

## 2014-07-10 DIAGNOSIS — M47812 Spondylosis without myelopathy or radiculopathy, cervical region: Secondary | ICD-10-CM | POA: Diagnosis not present

## 2014-07-10 DIAGNOSIS — M9901 Segmental and somatic dysfunction of cervical region: Secondary | ICD-10-CM | POA: Diagnosis not present

## 2014-07-10 DIAGNOSIS — M47814 Spondylosis without myelopathy or radiculopathy, thoracic region: Secondary | ICD-10-CM | POA: Diagnosis not present

## 2014-07-13 ENCOUNTER — Telehealth: Payer: Self-pay | Admitting: *Deleted

## 2014-07-13 NOTE — Telephone Encounter (Signed)
Patient informed. 

## 2014-07-13 NOTE — Telephone Encounter (Signed)
-----   Message from Satira Sark, MD sent at 07/10/2014 10:14 AM EST ----- Reviewed. Stable 1-39% ICA stenosis, bilaterally. Continue medical therapy and observation.

## 2014-07-14 ENCOUNTER — Ambulatory Visit (INDEPENDENT_AMBULATORY_CARE_PROVIDER_SITE_OTHER): Payer: Medicare Other | Admitting: *Deleted

## 2014-07-14 DIAGNOSIS — L719 Rosacea, unspecified: Secondary | ICD-10-CM | POA: Diagnosis not present

## 2014-07-14 DIAGNOSIS — I480 Paroxysmal atrial fibrillation: Secondary | ICD-10-CM | POA: Diagnosis not present

## 2014-07-14 DIAGNOSIS — Z7901 Long term (current) use of anticoagulants: Secondary | ICD-10-CM | POA: Diagnosis not present

## 2014-07-14 DIAGNOSIS — I4891 Unspecified atrial fibrillation: Secondary | ICD-10-CM | POA: Diagnosis not present

## 2014-07-14 DIAGNOSIS — Z5181 Encounter for therapeutic drug level monitoring: Secondary | ICD-10-CM

## 2014-07-14 LAB — POCT INR: INR: 2.4

## 2014-07-23 DIAGNOSIS — M47816 Spondylosis without myelopathy or radiculopathy, lumbar region: Secondary | ICD-10-CM | POA: Diagnosis not present

## 2014-07-23 DIAGNOSIS — M9902 Segmental and somatic dysfunction of thoracic region: Secondary | ICD-10-CM | POA: Diagnosis not present

## 2014-07-23 DIAGNOSIS — M9903 Segmental and somatic dysfunction of lumbar region: Secondary | ICD-10-CM | POA: Diagnosis not present

## 2014-07-23 DIAGNOSIS — S338XXA Sprain of other parts of lumbar spine and pelvis, initial encounter: Secondary | ICD-10-CM | POA: Diagnosis not present

## 2014-07-23 DIAGNOSIS — M531 Cervicobrachial syndrome: Secondary | ICD-10-CM | POA: Diagnosis not present

## 2014-07-23 DIAGNOSIS — M5116 Intervertebral disc disorders with radiculopathy, lumbar region: Secondary | ICD-10-CM | POA: Diagnosis not present

## 2014-07-23 DIAGNOSIS — M47812 Spondylosis without myelopathy or radiculopathy, cervical region: Secondary | ICD-10-CM | POA: Diagnosis not present

## 2014-07-23 DIAGNOSIS — M4004 Postural kyphosis, thoracic region: Secondary | ICD-10-CM | POA: Diagnosis not present

## 2014-07-23 DIAGNOSIS — M5136 Other intervertebral disc degeneration, lumbar region: Secondary | ICD-10-CM | POA: Diagnosis not present

## 2014-07-23 DIAGNOSIS — M47814 Spondylosis without myelopathy or radiculopathy, thoracic region: Secondary | ICD-10-CM | POA: Diagnosis not present

## 2014-07-23 DIAGNOSIS — M5441 Lumbago with sciatica, right side: Secondary | ICD-10-CM | POA: Diagnosis not present

## 2014-07-23 DIAGNOSIS — M9901 Segmental and somatic dysfunction of cervical region: Secondary | ICD-10-CM | POA: Diagnosis not present

## 2014-08-05 DIAGNOSIS — M47812 Spondylosis without myelopathy or radiculopathy, cervical region: Secondary | ICD-10-CM | POA: Diagnosis not present

## 2014-08-05 DIAGNOSIS — M9902 Segmental and somatic dysfunction of thoracic region: Secondary | ICD-10-CM | POA: Diagnosis not present

## 2014-08-05 DIAGNOSIS — M5136 Other intervertebral disc degeneration, lumbar region: Secondary | ICD-10-CM | POA: Diagnosis not present

## 2014-08-05 DIAGNOSIS — M5116 Intervertebral disc disorders with radiculopathy, lumbar region: Secondary | ICD-10-CM | POA: Diagnosis not present

## 2014-08-05 DIAGNOSIS — M9903 Segmental and somatic dysfunction of lumbar region: Secondary | ICD-10-CM | POA: Diagnosis not present

## 2014-08-05 DIAGNOSIS — M4004 Postural kyphosis, thoracic region: Secondary | ICD-10-CM | POA: Diagnosis not present

## 2014-08-05 DIAGNOSIS — M47816 Spondylosis without myelopathy or radiculopathy, lumbar region: Secondary | ICD-10-CM | POA: Diagnosis not present

## 2014-08-05 DIAGNOSIS — M9901 Segmental and somatic dysfunction of cervical region: Secondary | ICD-10-CM | POA: Diagnosis not present

## 2014-08-11 ENCOUNTER — Ambulatory Visit (INDEPENDENT_AMBULATORY_CARE_PROVIDER_SITE_OTHER): Payer: Medicare Other | Admitting: *Deleted

## 2014-08-11 DIAGNOSIS — I4891 Unspecified atrial fibrillation: Secondary | ICD-10-CM | POA: Diagnosis not present

## 2014-08-11 DIAGNOSIS — Z7901 Long term (current) use of anticoagulants: Secondary | ICD-10-CM

## 2014-08-11 DIAGNOSIS — I480 Paroxysmal atrial fibrillation: Secondary | ICD-10-CM | POA: Diagnosis not present

## 2014-08-11 DIAGNOSIS — Z5181 Encounter for therapeutic drug level monitoring: Secondary | ICD-10-CM

## 2014-08-11 LAB — POCT INR: INR: 2.1

## 2014-08-12 DIAGNOSIS — L570 Actinic keratosis: Secondary | ICD-10-CM | POA: Diagnosis not present

## 2014-08-12 DIAGNOSIS — L719 Rosacea, unspecified: Secondary | ICD-10-CM | POA: Diagnosis not present

## 2014-08-19 DIAGNOSIS — M9902 Segmental and somatic dysfunction of thoracic region: Secondary | ICD-10-CM | POA: Diagnosis not present

## 2014-08-19 DIAGNOSIS — M531 Cervicobrachial syndrome: Secondary | ICD-10-CM | POA: Diagnosis not present

## 2014-08-19 DIAGNOSIS — M5116 Intervertebral disc disorders with radiculopathy, lumbar region: Secondary | ICD-10-CM | POA: Diagnosis not present

## 2014-08-19 DIAGNOSIS — M9901 Segmental and somatic dysfunction of cervical region: Secondary | ICD-10-CM | POA: Diagnosis not present

## 2014-08-19 DIAGNOSIS — M5441 Lumbago with sciatica, right side: Secondary | ICD-10-CM | POA: Diagnosis not present

## 2014-08-19 DIAGNOSIS — M5136 Other intervertebral disc degeneration, lumbar region: Secondary | ICD-10-CM | POA: Diagnosis not present

## 2014-08-19 DIAGNOSIS — M47814 Spondylosis without myelopathy or radiculopathy, thoracic region: Secondary | ICD-10-CM | POA: Diagnosis not present

## 2014-08-19 DIAGNOSIS — M47816 Spondylosis without myelopathy or radiculopathy, lumbar region: Secondary | ICD-10-CM | POA: Diagnosis not present

## 2014-08-19 DIAGNOSIS — M4004 Postural kyphosis, thoracic region: Secondary | ICD-10-CM | POA: Diagnosis not present

## 2014-08-19 DIAGNOSIS — M9903 Segmental and somatic dysfunction of lumbar region: Secondary | ICD-10-CM | POA: Diagnosis not present

## 2014-08-19 DIAGNOSIS — M47812 Spondylosis without myelopathy or radiculopathy, cervical region: Secondary | ICD-10-CM | POA: Diagnosis not present

## 2014-09-02 DIAGNOSIS — M47814 Spondylosis without myelopathy or radiculopathy, thoracic region: Secondary | ICD-10-CM | POA: Diagnosis not present

## 2014-09-02 DIAGNOSIS — M5116 Intervertebral disc disorders with radiculopathy, lumbar region: Secondary | ICD-10-CM | POA: Diagnosis not present

## 2014-09-02 DIAGNOSIS — M9903 Segmental and somatic dysfunction of lumbar region: Secondary | ICD-10-CM | POA: Diagnosis not present

## 2014-09-02 DIAGNOSIS — M5136 Other intervertebral disc degeneration, lumbar region: Secondary | ICD-10-CM | POA: Diagnosis not present

## 2014-09-02 DIAGNOSIS — M47816 Spondylosis without myelopathy or radiculopathy, lumbar region: Secondary | ICD-10-CM | POA: Diagnosis not present

## 2014-09-02 DIAGNOSIS — M531 Cervicobrachial syndrome: Secondary | ICD-10-CM | POA: Diagnosis not present

## 2014-09-02 DIAGNOSIS — M9901 Segmental and somatic dysfunction of cervical region: Secondary | ICD-10-CM | POA: Diagnosis not present

## 2014-09-02 DIAGNOSIS — M9902 Segmental and somatic dysfunction of thoracic region: Secondary | ICD-10-CM | POA: Diagnosis not present

## 2014-09-02 DIAGNOSIS — M4004 Postural kyphosis, thoracic region: Secondary | ICD-10-CM | POA: Diagnosis not present

## 2014-09-02 DIAGNOSIS — M47812 Spondylosis without myelopathy or radiculopathy, cervical region: Secondary | ICD-10-CM | POA: Diagnosis not present

## 2014-09-02 DIAGNOSIS — M5441 Lumbago with sciatica, right side: Secondary | ICD-10-CM | POA: Diagnosis not present

## 2014-09-04 DIAGNOSIS — M5136 Other intervertebral disc degeneration, lumbar region: Secondary | ICD-10-CM | POA: Diagnosis not present

## 2014-09-04 DIAGNOSIS — M5116 Intervertebral disc disorders with radiculopathy, lumbar region: Secondary | ICD-10-CM | POA: Diagnosis not present

## 2014-09-04 DIAGNOSIS — M4004 Postural kyphosis, thoracic region: Secondary | ICD-10-CM | POA: Diagnosis not present

## 2014-09-04 DIAGNOSIS — S338XXA Sprain of other parts of lumbar spine and pelvis, initial encounter: Secondary | ICD-10-CM | POA: Diagnosis not present

## 2014-09-04 DIAGNOSIS — M9901 Segmental and somatic dysfunction of cervical region: Secondary | ICD-10-CM | POA: Diagnosis not present

## 2014-09-04 DIAGNOSIS — M47812 Spondylosis without myelopathy or radiculopathy, cervical region: Secondary | ICD-10-CM | POA: Diagnosis not present

## 2014-09-04 DIAGNOSIS — M9903 Segmental and somatic dysfunction of lumbar region: Secondary | ICD-10-CM | POA: Diagnosis not present

## 2014-09-04 DIAGNOSIS — M47816 Spondylosis without myelopathy or radiculopathy, lumbar region: Secondary | ICD-10-CM | POA: Diagnosis not present

## 2014-09-04 DIAGNOSIS — M531 Cervicobrachial syndrome: Secondary | ICD-10-CM | POA: Diagnosis not present

## 2014-09-04 DIAGNOSIS — M47814 Spondylosis without myelopathy or radiculopathy, thoracic region: Secondary | ICD-10-CM | POA: Diagnosis not present

## 2014-09-04 DIAGNOSIS — M5441 Lumbago with sciatica, right side: Secondary | ICD-10-CM | POA: Diagnosis not present

## 2014-09-04 DIAGNOSIS — M9902 Segmental and somatic dysfunction of thoracic region: Secondary | ICD-10-CM | POA: Diagnosis not present

## 2014-09-08 DIAGNOSIS — M5116 Intervertebral disc disorders with radiculopathy, lumbar region: Secondary | ICD-10-CM | POA: Diagnosis not present

## 2014-09-08 DIAGNOSIS — M47812 Spondylosis without myelopathy or radiculopathy, cervical region: Secondary | ICD-10-CM | POA: Diagnosis not present

## 2014-09-08 DIAGNOSIS — M4004 Postural kyphosis, thoracic region: Secondary | ICD-10-CM | POA: Diagnosis not present

## 2014-09-08 DIAGNOSIS — S338XXA Sprain of other parts of lumbar spine and pelvis, initial encounter: Secondary | ICD-10-CM | POA: Diagnosis not present

## 2014-09-08 DIAGNOSIS — M531 Cervicobrachial syndrome: Secondary | ICD-10-CM | POA: Diagnosis not present

## 2014-09-08 DIAGNOSIS — M9903 Segmental and somatic dysfunction of lumbar region: Secondary | ICD-10-CM | POA: Diagnosis not present

## 2014-09-08 DIAGNOSIS — M5136 Other intervertebral disc degeneration, lumbar region: Secondary | ICD-10-CM | POA: Diagnosis not present

## 2014-09-08 DIAGNOSIS — M47816 Spondylosis without myelopathy or radiculopathy, lumbar region: Secondary | ICD-10-CM | POA: Diagnosis not present

## 2014-09-08 DIAGNOSIS — M5441 Lumbago with sciatica, right side: Secondary | ICD-10-CM | POA: Diagnosis not present

## 2014-09-08 DIAGNOSIS — M9901 Segmental and somatic dysfunction of cervical region: Secondary | ICD-10-CM | POA: Diagnosis not present

## 2014-09-08 DIAGNOSIS — M9902 Segmental and somatic dysfunction of thoracic region: Secondary | ICD-10-CM | POA: Diagnosis not present

## 2014-09-08 DIAGNOSIS — M47814 Spondylosis without myelopathy or radiculopathy, thoracic region: Secondary | ICD-10-CM | POA: Diagnosis not present

## 2014-09-15 DIAGNOSIS — M9903 Segmental and somatic dysfunction of lumbar region: Secondary | ICD-10-CM | POA: Diagnosis not present

## 2014-09-15 DIAGNOSIS — M9902 Segmental and somatic dysfunction of thoracic region: Secondary | ICD-10-CM | POA: Diagnosis not present

## 2014-09-15 DIAGNOSIS — S338XXA Sprain of other parts of lumbar spine and pelvis, initial encounter: Secondary | ICD-10-CM | POA: Diagnosis not present

## 2014-09-15 DIAGNOSIS — M47812 Spondylosis without myelopathy or radiculopathy, cervical region: Secondary | ICD-10-CM | POA: Diagnosis not present

## 2014-09-15 DIAGNOSIS — M531 Cervicobrachial syndrome: Secondary | ICD-10-CM | POA: Diagnosis not present

## 2014-09-15 DIAGNOSIS — M47816 Spondylosis without myelopathy or radiculopathy, lumbar region: Secondary | ICD-10-CM | POA: Diagnosis not present

## 2014-09-15 DIAGNOSIS — M9901 Segmental and somatic dysfunction of cervical region: Secondary | ICD-10-CM | POA: Diagnosis not present

## 2014-09-15 DIAGNOSIS — M5116 Intervertebral disc disorders with radiculopathy, lumbar region: Secondary | ICD-10-CM | POA: Diagnosis not present

## 2014-09-15 DIAGNOSIS — M4004 Postural kyphosis, thoracic region: Secondary | ICD-10-CM | POA: Diagnosis not present

## 2014-09-15 DIAGNOSIS — M5136 Other intervertebral disc degeneration, lumbar region: Secondary | ICD-10-CM | POA: Diagnosis not present

## 2014-09-15 DIAGNOSIS — M47814 Spondylosis without myelopathy or radiculopathy, thoracic region: Secondary | ICD-10-CM | POA: Diagnosis not present

## 2014-09-15 DIAGNOSIS — M5441 Lumbago with sciatica, right side: Secondary | ICD-10-CM | POA: Diagnosis not present

## 2014-09-17 DIAGNOSIS — E780 Pure hypercholesterolemia: Secondary | ICD-10-CM | POA: Diagnosis not present

## 2014-09-17 DIAGNOSIS — I10 Essential (primary) hypertension: Secondary | ICD-10-CM | POA: Diagnosis not present

## 2014-09-17 DIAGNOSIS — R5381 Other malaise: Secondary | ICD-10-CM | POA: Diagnosis not present

## 2014-09-17 DIAGNOSIS — Z79899 Other long term (current) drug therapy: Secondary | ICD-10-CM | POA: Diagnosis not present

## 2014-09-17 DIAGNOSIS — R531 Weakness: Secondary | ICD-10-CM | POA: Diagnosis not present

## 2014-09-17 DIAGNOSIS — I482 Chronic atrial fibrillation: Secondary | ICD-10-CM | POA: Diagnosis not present

## 2014-09-17 DIAGNOSIS — E782 Mixed hyperlipidemia: Secondary | ICD-10-CM | POA: Diagnosis not present

## 2014-09-22 ENCOUNTER — Ambulatory Visit (INDEPENDENT_AMBULATORY_CARE_PROVIDER_SITE_OTHER): Payer: Medicare Other | Admitting: *Deleted

## 2014-09-22 DIAGNOSIS — I480 Paroxysmal atrial fibrillation: Secondary | ICD-10-CM | POA: Diagnosis not present

## 2014-09-22 DIAGNOSIS — I4891 Unspecified atrial fibrillation: Secondary | ICD-10-CM

## 2014-09-22 DIAGNOSIS — Z7901 Long term (current) use of anticoagulants: Secondary | ICD-10-CM

## 2014-09-22 DIAGNOSIS — Z5181 Encounter for therapeutic drug level monitoring: Secondary | ICD-10-CM

## 2014-09-22 LAB — POCT INR: INR: 2.1

## 2014-09-23 DIAGNOSIS — M531 Cervicobrachial syndrome: Secondary | ICD-10-CM | POA: Diagnosis not present

## 2014-09-23 DIAGNOSIS — M5116 Intervertebral disc disorders with radiculopathy, lumbar region: Secondary | ICD-10-CM | POA: Diagnosis not present

## 2014-09-23 DIAGNOSIS — M9903 Segmental and somatic dysfunction of lumbar region: Secondary | ICD-10-CM | POA: Diagnosis not present

## 2014-09-23 DIAGNOSIS — M5136 Other intervertebral disc degeneration, lumbar region: Secondary | ICD-10-CM | POA: Diagnosis not present

## 2014-09-23 DIAGNOSIS — M9902 Segmental and somatic dysfunction of thoracic region: Secondary | ICD-10-CM | POA: Diagnosis not present

## 2014-09-23 DIAGNOSIS — M5441 Lumbago with sciatica, right side: Secondary | ICD-10-CM | POA: Diagnosis not present

## 2014-09-23 DIAGNOSIS — M9901 Segmental and somatic dysfunction of cervical region: Secondary | ICD-10-CM | POA: Diagnosis not present

## 2014-09-23 DIAGNOSIS — M47816 Spondylosis without myelopathy or radiculopathy, lumbar region: Secondary | ICD-10-CM | POA: Diagnosis not present

## 2014-09-23 DIAGNOSIS — M47812 Spondylosis without myelopathy or radiculopathy, cervical region: Secondary | ICD-10-CM | POA: Diagnosis not present

## 2014-09-23 DIAGNOSIS — M4004 Postural kyphosis, thoracic region: Secondary | ICD-10-CM | POA: Diagnosis not present

## 2014-09-23 DIAGNOSIS — M47814 Spondylosis without myelopathy or radiculopathy, thoracic region: Secondary | ICD-10-CM | POA: Diagnosis not present

## 2014-09-23 DIAGNOSIS — S338XXA Sprain of other parts of lumbar spine and pelvis, initial encounter: Secondary | ICD-10-CM | POA: Diagnosis not present

## 2014-09-30 DIAGNOSIS — M5136 Other intervertebral disc degeneration, lumbar region: Secondary | ICD-10-CM | POA: Diagnosis not present

## 2014-09-30 DIAGNOSIS — M9903 Segmental and somatic dysfunction of lumbar region: Secondary | ICD-10-CM | POA: Diagnosis not present

## 2014-09-30 DIAGNOSIS — M47812 Spondylosis without myelopathy or radiculopathy, cervical region: Secondary | ICD-10-CM | POA: Diagnosis not present

## 2014-09-30 DIAGNOSIS — M47816 Spondylosis without myelopathy or radiculopathy, lumbar region: Secondary | ICD-10-CM | POA: Diagnosis not present

## 2014-09-30 DIAGNOSIS — M531 Cervicobrachial syndrome: Secondary | ICD-10-CM | POA: Diagnosis not present

## 2014-09-30 DIAGNOSIS — S338XXA Sprain of other parts of lumbar spine and pelvis, initial encounter: Secondary | ICD-10-CM | POA: Diagnosis not present

## 2014-09-30 DIAGNOSIS — M9901 Segmental and somatic dysfunction of cervical region: Secondary | ICD-10-CM | POA: Diagnosis not present

## 2014-09-30 DIAGNOSIS — M4004 Postural kyphosis, thoracic region: Secondary | ICD-10-CM | POA: Diagnosis not present

## 2014-09-30 DIAGNOSIS — M9902 Segmental and somatic dysfunction of thoracic region: Secondary | ICD-10-CM | POA: Diagnosis not present

## 2014-09-30 DIAGNOSIS — M47814 Spondylosis without myelopathy or radiculopathy, thoracic region: Secondary | ICD-10-CM | POA: Diagnosis not present

## 2014-09-30 DIAGNOSIS — M5441 Lumbago with sciatica, right side: Secondary | ICD-10-CM | POA: Diagnosis not present

## 2014-09-30 DIAGNOSIS — M5116 Intervertebral disc disorders with radiculopathy, lumbar region: Secondary | ICD-10-CM | POA: Diagnosis not present

## 2014-10-07 DIAGNOSIS — M9902 Segmental and somatic dysfunction of thoracic region: Secondary | ICD-10-CM | POA: Diagnosis not present

## 2014-10-07 DIAGNOSIS — M47812 Spondylosis without myelopathy or radiculopathy, cervical region: Secondary | ICD-10-CM | POA: Diagnosis not present

## 2014-10-07 DIAGNOSIS — M47816 Spondylosis without myelopathy or radiculopathy, lumbar region: Secondary | ICD-10-CM | POA: Diagnosis not present

## 2014-10-07 DIAGNOSIS — S338XXA Sprain of other parts of lumbar spine and pelvis, initial encounter: Secondary | ICD-10-CM | POA: Diagnosis not present

## 2014-10-07 DIAGNOSIS — M4004 Postural kyphosis, thoracic region: Secondary | ICD-10-CM | POA: Diagnosis not present

## 2014-10-07 DIAGNOSIS — M9903 Segmental and somatic dysfunction of lumbar region: Secondary | ICD-10-CM | POA: Diagnosis not present

## 2014-10-07 DIAGNOSIS — M5441 Lumbago with sciatica, right side: Secondary | ICD-10-CM | POA: Diagnosis not present

## 2014-10-07 DIAGNOSIS — M5116 Intervertebral disc disorders with radiculopathy, lumbar region: Secondary | ICD-10-CM | POA: Diagnosis not present

## 2014-10-07 DIAGNOSIS — M47814 Spondylosis without myelopathy or radiculopathy, thoracic region: Secondary | ICD-10-CM | POA: Diagnosis not present

## 2014-10-07 DIAGNOSIS — M9901 Segmental and somatic dysfunction of cervical region: Secondary | ICD-10-CM | POA: Diagnosis not present

## 2014-10-07 DIAGNOSIS — M531 Cervicobrachial syndrome: Secondary | ICD-10-CM | POA: Diagnosis not present

## 2014-10-07 DIAGNOSIS — M5136 Other intervertebral disc degeneration, lumbar region: Secondary | ICD-10-CM | POA: Diagnosis not present

## 2014-10-15 DIAGNOSIS — M9902 Segmental and somatic dysfunction of thoracic region: Secondary | ICD-10-CM | POA: Diagnosis not present

## 2014-10-15 DIAGNOSIS — M47812 Spondylosis without myelopathy or radiculopathy, cervical region: Secondary | ICD-10-CM | POA: Diagnosis not present

## 2014-10-15 DIAGNOSIS — M531 Cervicobrachial syndrome: Secondary | ICD-10-CM | POA: Diagnosis not present

## 2014-10-15 DIAGNOSIS — S338XXA Sprain of other parts of lumbar spine and pelvis, initial encounter: Secondary | ICD-10-CM | POA: Diagnosis not present

## 2014-10-15 DIAGNOSIS — M4004 Postural kyphosis, thoracic region: Secondary | ICD-10-CM | POA: Diagnosis not present

## 2014-10-15 DIAGNOSIS — M47814 Spondylosis without myelopathy or radiculopathy, thoracic region: Secondary | ICD-10-CM | POA: Diagnosis not present

## 2014-10-15 DIAGNOSIS — M5441 Lumbago with sciatica, right side: Secondary | ICD-10-CM | POA: Diagnosis not present

## 2014-10-15 DIAGNOSIS — M47816 Spondylosis without myelopathy or radiculopathy, lumbar region: Secondary | ICD-10-CM | POA: Diagnosis not present

## 2014-10-15 DIAGNOSIS — M9903 Segmental and somatic dysfunction of lumbar region: Secondary | ICD-10-CM | POA: Diagnosis not present

## 2014-10-15 DIAGNOSIS — M5116 Intervertebral disc disorders with radiculopathy, lumbar region: Secondary | ICD-10-CM | POA: Diagnosis not present

## 2014-10-15 DIAGNOSIS — M5136 Other intervertebral disc degeneration, lumbar region: Secondary | ICD-10-CM | POA: Diagnosis not present

## 2014-10-15 DIAGNOSIS — M9901 Segmental and somatic dysfunction of cervical region: Secondary | ICD-10-CM | POA: Diagnosis not present

## 2014-10-21 DIAGNOSIS — M531 Cervicobrachial syndrome: Secondary | ICD-10-CM | POA: Diagnosis not present

## 2014-10-21 DIAGNOSIS — M9903 Segmental and somatic dysfunction of lumbar region: Secondary | ICD-10-CM | POA: Diagnosis not present

## 2014-10-21 DIAGNOSIS — M47812 Spondylosis without myelopathy or radiculopathy, cervical region: Secondary | ICD-10-CM | POA: Diagnosis not present

## 2014-10-21 DIAGNOSIS — M5441 Lumbago with sciatica, right side: Secondary | ICD-10-CM | POA: Diagnosis not present

## 2014-10-21 DIAGNOSIS — M5116 Intervertebral disc disorders with radiculopathy, lumbar region: Secondary | ICD-10-CM | POA: Diagnosis not present

## 2014-10-21 DIAGNOSIS — M4004 Postural kyphosis, thoracic region: Secondary | ICD-10-CM | POA: Diagnosis not present

## 2014-10-21 DIAGNOSIS — M9901 Segmental and somatic dysfunction of cervical region: Secondary | ICD-10-CM | POA: Diagnosis not present

## 2014-10-21 DIAGNOSIS — M47814 Spondylosis without myelopathy or radiculopathy, thoracic region: Secondary | ICD-10-CM | POA: Diagnosis not present

## 2014-10-21 DIAGNOSIS — M9902 Segmental and somatic dysfunction of thoracic region: Secondary | ICD-10-CM | POA: Diagnosis not present

## 2014-10-21 DIAGNOSIS — M5136 Other intervertebral disc degeneration, lumbar region: Secondary | ICD-10-CM | POA: Diagnosis not present

## 2014-10-21 DIAGNOSIS — S338XXA Sprain of other parts of lumbar spine and pelvis, initial encounter: Secondary | ICD-10-CM | POA: Diagnosis not present

## 2014-10-21 DIAGNOSIS — M47816 Spondylosis without myelopathy or radiculopathy, lumbar region: Secondary | ICD-10-CM | POA: Diagnosis not present

## 2014-10-22 DIAGNOSIS — E782 Mixed hyperlipidemia: Secondary | ICD-10-CM | POA: Diagnosis not present

## 2014-10-22 DIAGNOSIS — E559 Vitamin D deficiency, unspecified: Secondary | ICD-10-CM | POA: Diagnosis not present

## 2014-10-22 DIAGNOSIS — E780 Pure hypercholesterolemia: Secondary | ICD-10-CM | POA: Diagnosis not present

## 2014-10-22 DIAGNOSIS — Z79899 Other long term (current) drug therapy: Secondary | ICD-10-CM | POA: Diagnosis not present

## 2014-10-22 DIAGNOSIS — E039 Hypothyroidism, unspecified: Secondary | ICD-10-CM | POA: Diagnosis not present

## 2014-10-22 DIAGNOSIS — M7062 Trochanteric bursitis, left hip: Secondary | ICD-10-CM | POA: Diagnosis not present

## 2014-10-22 DIAGNOSIS — E569 Vitamin deficiency, unspecified: Secondary | ICD-10-CM | POA: Diagnosis not present

## 2014-10-22 DIAGNOSIS — I10 Essential (primary) hypertension: Secondary | ICD-10-CM | POA: Diagnosis not present

## 2014-10-22 DIAGNOSIS — R531 Weakness: Secondary | ICD-10-CM | POA: Diagnosis not present

## 2014-10-22 DIAGNOSIS — R5381 Other malaise: Secondary | ICD-10-CM | POA: Diagnosis not present

## 2014-10-28 DIAGNOSIS — M9901 Segmental and somatic dysfunction of cervical region: Secondary | ICD-10-CM | POA: Diagnosis not present

## 2014-10-28 DIAGNOSIS — M47814 Spondylosis without myelopathy or radiculopathy, thoracic region: Secondary | ICD-10-CM | POA: Diagnosis not present

## 2014-10-28 DIAGNOSIS — M47812 Spondylosis without myelopathy or radiculopathy, cervical region: Secondary | ICD-10-CM | POA: Diagnosis not present

## 2014-10-28 DIAGNOSIS — M5136 Other intervertebral disc degeneration, lumbar region: Secondary | ICD-10-CM | POA: Diagnosis not present

## 2014-10-28 DIAGNOSIS — M4004 Postural kyphosis, thoracic region: Secondary | ICD-10-CM | POA: Diagnosis not present

## 2014-10-28 DIAGNOSIS — M5116 Intervertebral disc disorders with radiculopathy, lumbar region: Secondary | ICD-10-CM | POA: Diagnosis not present

## 2014-10-28 DIAGNOSIS — M9903 Segmental and somatic dysfunction of lumbar region: Secondary | ICD-10-CM | POA: Diagnosis not present

## 2014-10-28 DIAGNOSIS — M5442 Lumbago with sciatica, left side: Secondary | ICD-10-CM | POA: Diagnosis not present

## 2014-10-28 DIAGNOSIS — M9902 Segmental and somatic dysfunction of thoracic region: Secondary | ICD-10-CM | POA: Diagnosis not present

## 2014-10-28 DIAGNOSIS — S338XXA Sprain of other parts of lumbar spine and pelvis, initial encounter: Secondary | ICD-10-CM | POA: Diagnosis not present

## 2014-10-28 DIAGNOSIS — M47816 Spondylosis without myelopathy or radiculopathy, lumbar region: Secondary | ICD-10-CM | POA: Diagnosis not present

## 2014-10-28 DIAGNOSIS — M531 Cervicobrachial syndrome: Secondary | ICD-10-CM | POA: Diagnosis not present

## 2014-11-03 ENCOUNTER — Ambulatory Visit (INDEPENDENT_AMBULATORY_CARE_PROVIDER_SITE_OTHER): Payer: Medicare Other | Admitting: Internal Medicine

## 2014-11-03 ENCOUNTER — Ambulatory Visit (INDEPENDENT_AMBULATORY_CARE_PROVIDER_SITE_OTHER): Payer: Medicare Other | Admitting: *Deleted

## 2014-11-03 ENCOUNTER — Encounter (INDEPENDENT_AMBULATORY_CARE_PROVIDER_SITE_OTHER): Payer: Self-pay | Admitting: Internal Medicine

## 2014-11-03 VITALS — BP 112/78 | HR 70 | Temp 97.7°F | Resp 18 | Ht 63.0 in | Wt 117.8 lb

## 2014-11-03 DIAGNOSIS — Z5181 Encounter for therapeutic drug level monitoring: Secondary | ICD-10-CM | POA: Diagnosis not present

## 2014-11-03 DIAGNOSIS — Z7901 Long term (current) use of anticoagulants: Secondary | ICD-10-CM | POA: Diagnosis not present

## 2014-11-03 DIAGNOSIS — R198 Other specified symptoms and signs involving the digestive system and abdomen: Secondary | ICD-10-CM | POA: Diagnosis not present

## 2014-11-03 DIAGNOSIS — I4891 Unspecified atrial fibrillation: Secondary | ICD-10-CM

## 2014-11-03 DIAGNOSIS — I6523 Occlusion and stenosis of bilateral carotid arteries: Secondary | ICD-10-CM

## 2014-11-03 DIAGNOSIS — K5010 Crohn's disease of large intestine without complications: Secondary | ICD-10-CM

## 2014-11-03 LAB — POCT INR: INR: 3

## 2014-11-03 MED ORDER — INULIN 1.5 G PO CHEW: 1.0000 | CHEWABLE_TABLET | Freq: Two times a day (BID) | ORAL | Status: DC

## 2014-11-03 NOTE — Patient Instructions (Signed)
Fiber choice 1 tablet twice daily for 1 month and call with progress report to let us know if it helps stop rectal discharge.

## 2014-11-03 NOTE — Progress Notes (Signed)
Presenting complaint;  Follow-up for Crohn's colitis and rectal discharge.  Subjective:  Patient is an 81 year old Caucasian female who presents for scheduled visit. She was last seen in December 2015. She is having 1-2 formed stool daily. She denies abdominal pain melena or rectal bleeding. She continues to experience clear rectal discharge. She uses pads daily. She says amount of mucus that she is passing has decreased. She is not having any more pain in right low quadrant. She has had injections to her back which has helped greatly. She is not having any side effects with oral mesalamine and requests samples.   Current Medications: Outpatient Encounter Prescriptions as of 11/03/2014  Medication Sig  . acetaminophen (TYLENOL) 325 MG tablet Take 325 mg by mouth every 6 (six) hours as needed. pain  . ALPRAZolam (XANAX) 0.5 MG tablet Take 0.5 mg by mouth every 8 (eight) hours as needed for anxiety.  . diclofenac sodium (VOLTAREN) 1 % GEL Apply 1 application topically 2 (two) times daily as needed. For neck pain  . DILTIAZEM CD 180 MG 24 hr capsule Take 1 capsule by mouth daily.  . furosemide (LASIX) 20 MG tablet Take 1 tablet (20 mg total) by mouth daily.  . LUTEIN PO Take by mouth daily.  . mesalamine (APRISO) 0.375 G 24 hr capsule Take 375 mg by mouth daily. Patient takes 2 by mouth in the morning and 2 by mouth in the evening.  . Multiple Vitamins-Minerals (HAIR/SKIN/NAILS) TABS Take 1 tablet by mouth daily.  . Omega-3 Fatty Acids (FISH OIL) 1200 MG CAPS Take 1,200 mg by mouth daily.  . potassium chloride (K-DUR) 10 MEQ tablet Take 1 tablet (10 mEq total) by mouth daily.  Marland Kitchen warfarin (COUMADIN) 5 MG tablet TAKE 1 AND 1/2 TABLETS BY MOUTH DAILY EXCEPT 1 TABLET ON MONDAYS, [place].  . [DISCONTINUED] DILTIAZEM CD 180 MG 24 hr capsule TAKE 1 CAPSULE BY MOUTH EVERY DAY (DOSE INCREASE) (Patient not taking: Reported on 11/03/2014)  . [DISCONTINUED] Mesalamine (ASACOL) 400 MG CPDR DR  capsule Take 2 capsules (800 mg total) by mouth 2 (two) times daily. (Patient not taking: Reported on 11/03/2014)   No facility-administered encounter medications on file as of 11/03/2014.     Objective: Blood pressure 112/78, pulse 70, temperature 97.7 F (36.5 C), temperature source Oral, resp. rate 18, height 5' 3"  (1.6 m), weight 117 lb 12.8 oz (53.434 kg). Patient is alert and in no acute distress. Conjunctiva is pink. Sclera is nonicteric Oropharyngeal mucosa is normal. No neck masses or thyromegaly noted. Cardiac exam with irregular rhythm normal S1 and S2. No murmur or gallop noted. Lungs are clear to auscultation. Abdomen is symmetrical soft and nontender without organomegaly or masses. No LE edema or clubbing noted.    Assessment:  #1. Crohn's colitis. She is doing well with oral mesalamine. She is having normal stools but still passing mucus per rectum but quantity has dropped. She may benefit from addition of fiber supplement.   Plan:  Fiber choice 1.5 g by mouth twice a day. Patient given Apriso samples. Patient will call with progress report in one month. Office visit in one year.

## 2014-11-11 DIAGNOSIS — M9903 Segmental and somatic dysfunction of lumbar region: Secondary | ICD-10-CM | POA: Diagnosis not present

## 2014-11-11 DIAGNOSIS — M9902 Segmental and somatic dysfunction of thoracic region: Secondary | ICD-10-CM | POA: Diagnosis not present

## 2014-11-11 DIAGNOSIS — M47816 Spondylosis without myelopathy or radiculopathy, lumbar region: Secondary | ICD-10-CM | POA: Diagnosis not present

## 2014-11-11 DIAGNOSIS — M4004 Postural kyphosis, thoracic region: Secondary | ICD-10-CM | POA: Diagnosis not present

## 2014-11-11 DIAGNOSIS — M531 Cervicobrachial syndrome: Secondary | ICD-10-CM | POA: Diagnosis not present

## 2014-11-11 DIAGNOSIS — S338XXA Sprain of other parts of lumbar spine and pelvis, initial encounter: Secondary | ICD-10-CM | POA: Diagnosis not present

## 2014-11-11 DIAGNOSIS — M9901 Segmental and somatic dysfunction of cervical region: Secondary | ICD-10-CM | POA: Diagnosis not present

## 2014-11-11 DIAGNOSIS — M5116 Intervertebral disc disorders with radiculopathy, lumbar region: Secondary | ICD-10-CM | POA: Diagnosis not present

## 2014-11-11 DIAGNOSIS — M47812 Spondylosis without myelopathy or radiculopathy, cervical region: Secondary | ICD-10-CM | POA: Diagnosis not present

## 2014-11-11 DIAGNOSIS — M5442 Lumbago with sciatica, left side: Secondary | ICD-10-CM | POA: Diagnosis not present

## 2014-11-11 DIAGNOSIS — M47814 Spondylosis without myelopathy or radiculopathy, thoracic region: Secondary | ICD-10-CM | POA: Diagnosis not present

## 2014-11-11 DIAGNOSIS — M5136 Other intervertebral disc degeneration, lumbar region: Secondary | ICD-10-CM | POA: Diagnosis not present

## 2014-11-24 ENCOUNTER — Encounter: Payer: Self-pay | Admitting: Cardiology

## 2014-11-24 ENCOUNTER — Ambulatory Visit (INDEPENDENT_AMBULATORY_CARE_PROVIDER_SITE_OTHER): Payer: Medicare Other | Admitting: Cardiology

## 2014-11-24 VITALS — BP 106/73 | HR 83 | Ht 63.0 in | Wt 118.8 lb

## 2014-11-24 DIAGNOSIS — I482 Chronic atrial fibrillation, unspecified: Secondary | ICD-10-CM

## 2014-11-24 DIAGNOSIS — I6523 Occlusion and stenosis of bilateral carotid arteries: Secondary | ICD-10-CM | POA: Diagnosis not present

## 2014-11-24 DIAGNOSIS — I739 Peripheral vascular disease, unspecified: Secondary | ICD-10-CM

## 2014-11-24 DIAGNOSIS — I779 Disorder of arteries and arterioles, unspecified: Secondary | ICD-10-CM

## 2014-11-24 NOTE — Progress Notes (Signed)
  Cardiology Office Note  Date: 11/24/2014   ID: Natalie Carlson, DOB 12/21/1929, MRN [id number]  PCP: QURESHI, AYYAZ, MD  Primary Cardiologist:  , MD   Chief Complaint  Patient presents with  . Atrial Fibrillation    History of Present Illness: Natalie Carlson is an 81 y.o. female last seen in January. She presents for a routine follow-up visit. Reports no palpitations or chest pain, chronic NYHA class 2-3 dyspnea. She remains functional in her ADLs including yard work.  She continues on Coumadin, followed in the anticoagulation clinic. No reported bleeding problems. Remains on Cardizem CD for heart rate control.  Most recent cardiac testing is outlined below.   Past Medical History  Diagnosis Date  . Hypothyroidism   . Cataract of both eyes   . Mucus in stool   . Chronic diarrhea   . Hyperlipidemia   . Colitis   . History of colon polyps   . History of migraines   . Arthritis   . Chronic back pain   . Hemorrhoids   . Macular degeneration, dry   . Adenocarcinoma of lung     Stage  1A  EGFR positive  ALK-negative   . Atrial fibrillation   . Nephrolithiasis     1994 - passed on its on    Current Outpatient Prescriptions  Medication Sig Dispense Refill  . acetaminophen (TYLENOL) 325 MG tablet Take 325 mg by mouth every 6 (six) hours as needed. pain    . ALPRAZolam (XANAX) 0.5 MG tablet Take 0.5 mg by mouth every 8 (eight) hours as needed for anxiety.    . diclofenac sodium (VOLTAREN) 1 % GEL Apply 1 application topically 2 (two) times daily as needed. For neck pain    . DILTIAZEM CD 180 MG 24 hr capsule Take 1 capsule by mouth daily.    . furosemide (LASIX) 20 MG tablet Take 1 tablet (20 mg total) by mouth daily. 30 tablet 6  . LUTEIN PO Take by mouth daily.    . mesalamine (APRISO) 0.375 G 24 hr capsule Take 375 mg by mouth daily. Patient takes 2 by mouth in the morning and 2 by mouth in the evening.    . Multiple Vitamins-Minerals (HAIR/SKIN/NAILS) TABS  Take 1 tablet by mouth daily.    . Omega-3 Fatty Acids (FISH OIL) 1200 MG CAPS Take 1,200 mg by mouth daily.    . potassium chloride (K-DUR) 10 MEQ tablet Take 1 tablet (10 mEq total) by mouth daily. 30 tablet 6  . warfarin (COUMADIN) 5 MG tablet TAKE 1 AND 1/2 TABLETS BY MOUTH DAILY EXCEPT 1 TABLET ON MONDAYS, WEDNESDAYS AND FRIDAYS. 45 tablet 6   No current facility-administered medications for this visit.    Allergies:  Doxycycline; Entocort ec; Neurontin; Vicodin; Adhesive; Chocolate; Codeine; Morphine and related; Penicillins; Prednisone; Uncoded nonscreenable allergen; Amoxicillin; Levaquin; Naprosyn; and Tramadol   Social History: The patient  reports that she has never smoked. She has never used smokeless tobacco. She reports that she does not drink alcohol or use illicit drugs.   ROS:  Please see the history of present illness. Otherwise, complete review of systems is positive for chronic arthritic pains.  All other systems are reviewed and negative.   Physical Exam: VS:  BP 106/73 mmHg  Pulse 83  Ht 5' 3" (1.6 m)  Wt 118 lb 12.8 oz (53.887 kg)  BMI 21.05 kg/m2  SpO2 96%, BMI Body mass index is 21.05 kg/(m^2).  Wt Readings from Last 3   Encounters:  11/24/14 118 lb 12.8 oz (53.887 kg)  11/03/14 117 lb 12.8 oz (53.434 kg)  05/18/14 121 lb (54.885 kg)     No acute distress.  HEENT: Conjunctiva and lids normal, oropharynx clear.  Neck: Supple, prominent superficial veins on the left side of the base of the neck, also strap muscles of the neck easily palpable. No pain in this area. Questionable carotid bruit  Lungs: Diminished but cleaar to auscultation, nonlabored breathing at rest.  Cardiac: Irregularly irregular, no S3 or significant systolic murmur, no pericardial rub.  Abdomen: Soft, nontender, bowel sounds present.  Extremities: Trace edema, distal pulses 2+.    ECG: ECG is not ordered today.   Recent Labwork: 01/07/2014: BUN 14; Creatinine, Ser 0.68;  Hemoglobin 14.7; Platelets 290; Potassium 4.2; Pro B Natriuretic peptide (BNP) 739.8*; Sodium 141   Other Studies Reviewed Today:  Echocardiogram from September 2015 showed LVEF 60-65% with indeterminate diastolic function in the setting of atrial fibrillation/flutter, MAC with mild mitral regurgitation, mild to moderate RV enlargement with normal contraction, mild to moderate tricuspid regurgitation with PASP 34 mmHg.  Carotid Dopplers from 07/10/2014 showed 1-39% bilateral ICA stenoses.  Assessment and Plan:  1. Chronic atrial fibrillation, continue strategy of heart rate control and anticoagulation. No changes were made today in her regimen. Keep follow-up in the anticoagulation clinic.  2. Mild carotid artery disease, 1-39% bilateral ICA stenoses as of February of this year.  Current medicines were reviewed with the patient today.   Disposition: FU with me in 6 months.   Signed,  G. , MD, FACC 11/24/2014 9:03 AM    [facility] [place] [address], [place], [place] [address] Phone: [phone]; Fax: [phone]  

## 2014-11-24 NOTE — Patient Instructions (Signed)
Your physician recommends that you continue on your current medications as directed. Please refer to the Current Medication list given to you today. Your physician recommends that you schedule a follow-up appointment in: 6 months. You will receive a reminder letter in the mail in about 4 months reminding you to call and schedule your appointment. If you don't receive this letter, please contact our office. 

## 2014-11-25 DIAGNOSIS — M47812 Spondylosis without myelopathy or radiculopathy, cervical region: Secondary | ICD-10-CM | POA: Diagnosis not present

## 2014-11-25 DIAGNOSIS — M4004 Postural kyphosis, thoracic region: Secondary | ICD-10-CM | POA: Diagnosis not present

## 2014-11-25 DIAGNOSIS — M5136 Other intervertebral disc degeneration, lumbar region: Secondary | ICD-10-CM | POA: Diagnosis not present

## 2014-11-25 DIAGNOSIS — S338XXA Sprain of other parts of lumbar spine and pelvis, initial encounter: Secondary | ICD-10-CM | POA: Diagnosis not present

## 2014-11-25 DIAGNOSIS — M9901 Segmental and somatic dysfunction of cervical region: Secondary | ICD-10-CM | POA: Diagnosis not present

## 2014-11-25 DIAGNOSIS — M9902 Segmental and somatic dysfunction of thoracic region: Secondary | ICD-10-CM | POA: Diagnosis not present

## 2014-11-25 DIAGNOSIS — M531 Cervicobrachial syndrome: Secondary | ICD-10-CM | POA: Diagnosis not present

## 2014-11-25 DIAGNOSIS — M9903 Segmental and somatic dysfunction of lumbar region: Secondary | ICD-10-CM | POA: Diagnosis not present

## 2014-11-25 DIAGNOSIS — M5442 Lumbago with sciatica, left side: Secondary | ICD-10-CM | POA: Diagnosis not present

## 2014-11-25 DIAGNOSIS — M47814 Spondylosis without myelopathy or radiculopathy, thoracic region: Secondary | ICD-10-CM | POA: Diagnosis not present

## 2014-11-25 DIAGNOSIS — M47816 Spondylosis without myelopathy or radiculopathy, lumbar region: Secondary | ICD-10-CM | POA: Diagnosis not present

## 2014-11-25 DIAGNOSIS — M5116 Intervertebral disc disorders with radiculopathy, lumbar region: Secondary | ICD-10-CM | POA: Diagnosis not present

## 2014-12-09 DIAGNOSIS — M531 Cervicobrachial syndrome: Secondary | ICD-10-CM | POA: Diagnosis not present

## 2014-12-09 DIAGNOSIS — M47816 Spondylosis without myelopathy or radiculopathy, lumbar region: Secondary | ICD-10-CM | POA: Diagnosis not present

## 2014-12-09 DIAGNOSIS — M9901 Segmental and somatic dysfunction of cervical region: Secondary | ICD-10-CM | POA: Diagnosis not present

## 2014-12-09 DIAGNOSIS — M5442 Lumbago with sciatica, left side: Secondary | ICD-10-CM | POA: Diagnosis not present

## 2014-12-09 DIAGNOSIS — M9903 Segmental and somatic dysfunction of lumbar region: Secondary | ICD-10-CM | POA: Diagnosis not present

## 2014-12-09 DIAGNOSIS — M47814 Spondylosis without myelopathy or radiculopathy, thoracic region: Secondary | ICD-10-CM | POA: Diagnosis not present

## 2014-12-09 DIAGNOSIS — M5136 Other intervertebral disc degeneration, lumbar region: Secondary | ICD-10-CM | POA: Diagnosis not present

## 2014-12-09 DIAGNOSIS — S338XXA Sprain of other parts of lumbar spine and pelvis, initial encounter: Secondary | ICD-10-CM | POA: Diagnosis not present

## 2014-12-09 DIAGNOSIS — M9902 Segmental and somatic dysfunction of thoracic region: Secondary | ICD-10-CM | POA: Diagnosis not present

## 2014-12-09 DIAGNOSIS — M4004 Postural kyphosis, thoracic region: Secondary | ICD-10-CM | POA: Diagnosis not present

## 2014-12-09 DIAGNOSIS — M47812 Spondylosis without myelopathy or radiculopathy, cervical region: Secondary | ICD-10-CM | POA: Diagnosis not present

## 2014-12-09 DIAGNOSIS — M5116 Intervertebral disc disorders with radiculopathy, lumbar region: Secondary | ICD-10-CM | POA: Diagnosis not present

## 2014-12-15 ENCOUNTER — Ambulatory Visit (INDEPENDENT_AMBULATORY_CARE_PROVIDER_SITE_OTHER): Payer: Medicare Other | Admitting: *Deleted

## 2014-12-15 DIAGNOSIS — Z7901 Long term (current) use of anticoagulants: Secondary | ICD-10-CM | POA: Diagnosis not present

## 2014-12-15 DIAGNOSIS — I4891 Unspecified atrial fibrillation: Secondary | ICD-10-CM | POA: Diagnosis not present

## 2014-12-15 DIAGNOSIS — Z5181 Encounter for therapeutic drug level monitoring: Secondary | ICD-10-CM

## 2014-12-15 LAB — POCT INR: INR: 2.3

## 2014-12-17 DIAGNOSIS — Z79899 Other long term (current) drug therapy: Secondary | ICD-10-CM | POA: Diagnosis not present

## 2014-12-17 DIAGNOSIS — Z85118 Personal history of other malignant neoplasm of bronchus and lung: Secondary | ICD-10-CM | POA: Diagnosis not present

## 2014-12-17 DIAGNOSIS — M4856XA Collapsed vertebra, not elsewhere classified, lumbar region, initial encounter for fracture: Secondary | ICD-10-CM | POA: Diagnosis not present

## 2014-12-17 DIAGNOSIS — Z7901 Long term (current) use of anticoagulants: Secondary | ICD-10-CM | POA: Diagnosis not present

## 2014-12-17 DIAGNOSIS — M419 Scoliosis, unspecified: Secondary | ICD-10-CM | POA: Diagnosis not present

## 2014-12-17 DIAGNOSIS — M4316 Spondylolisthesis, lumbar region: Secondary | ICD-10-CM | POA: Diagnosis not present

## 2014-12-28 ENCOUNTER — Telehealth (INDEPENDENT_AMBULATORY_CARE_PROVIDER_SITE_OTHER): Payer: Self-pay | Admitting: *Deleted

## 2014-12-28 NOTE — Telephone Encounter (Signed)
Per Dr.Rehman the patient may stop the Fiber. May give her samples of Delzicol 400 mg - 2 tablets twice a day. Patient was called and made aware.

## 2014-12-28 NOTE — Telephone Encounter (Signed)
Progress report of the Fiber Sure  Can't take it--tears her bowels up   Also needs samples if possible of Aprizo or Dezicol  [phone] cell phone

## 2014-12-30 DIAGNOSIS — M5116 Intervertebral disc disorders with radiculopathy, lumbar region: Secondary | ICD-10-CM | POA: Diagnosis not present

## 2014-12-30 DIAGNOSIS — M9901 Segmental and somatic dysfunction of cervical region: Secondary | ICD-10-CM | POA: Diagnosis not present

## 2014-12-30 DIAGNOSIS — S338XXA Sprain of other parts of lumbar spine and pelvis, initial encounter: Secondary | ICD-10-CM | POA: Diagnosis not present

## 2014-12-30 DIAGNOSIS — M9903 Segmental and somatic dysfunction of lumbar region: Secondary | ICD-10-CM | POA: Diagnosis not present

## 2014-12-30 DIAGNOSIS — M5442 Lumbago with sciatica, left side: Secondary | ICD-10-CM | POA: Diagnosis not present

## 2014-12-30 DIAGNOSIS — M47814 Spondylosis without myelopathy or radiculopathy, thoracic region: Secondary | ICD-10-CM | POA: Diagnosis not present

## 2014-12-30 DIAGNOSIS — M9902 Segmental and somatic dysfunction of thoracic region: Secondary | ICD-10-CM | POA: Diagnosis not present

## 2014-12-30 DIAGNOSIS — M531 Cervicobrachial syndrome: Secondary | ICD-10-CM | POA: Diagnosis not present

## 2014-12-30 DIAGNOSIS — M4004 Postural kyphosis, thoracic region: Secondary | ICD-10-CM | POA: Diagnosis not present

## 2014-12-30 DIAGNOSIS — M47812 Spondylosis without myelopathy or radiculopathy, cervical region: Secondary | ICD-10-CM | POA: Diagnosis not present

## 2014-12-30 DIAGNOSIS — M5136 Other intervertebral disc degeneration, lumbar region: Secondary | ICD-10-CM | POA: Diagnosis not present

## 2014-12-30 DIAGNOSIS — M47816 Spondylosis without myelopathy or radiculopathy, lumbar region: Secondary | ICD-10-CM | POA: Diagnosis not present

## 2015-01-04 ENCOUNTER — Other Ambulatory Visit (INDEPENDENT_AMBULATORY_CARE_PROVIDER_SITE_OTHER): Payer: Self-pay | Admitting: Internal Medicine

## 2015-01-05 DIAGNOSIS — M9902 Segmental and somatic dysfunction of thoracic region: Secondary | ICD-10-CM | POA: Diagnosis not present

## 2015-01-05 DIAGNOSIS — M9903 Segmental and somatic dysfunction of lumbar region: Secondary | ICD-10-CM | POA: Diagnosis not present

## 2015-01-05 DIAGNOSIS — M5116 Intervertebral disc disorders with radiculopathy, lumbar region: Secondary | ICD-10-CM | POA: Diagnosis not present

## 2015-01-05 DIAGNOSIS — M5136 Other intervertebral disc degeneration, lumbar region: Secondary | ICD-10-CM | POA: Diagnosis not present

## 2015-01-05 DIAGNOSIS — M5442 Lumbago with sciatica, left side: Secondary | ICD-10-CM | POA: Diagnosis not present

## 2015-01-05 DIAGNOSIS — M47814 Spondylosis without myelopathy or radiculopathy, thoracic region: Secondary | ICD-10-CM | POA: Diagnosis not present

## 2015-01-05 DIAGNOSIS — M9901 Segmental and somatic dysfunction of cervical region: Secondary | ICD-10-CM | POA: Diagnosis not present

## 2015-01-05 DIAGNOSIS — M531 Cervicobrachial syndrome: Secondary | ICD-10-CM | POA: Diagnosis not present

## 2015-01-05 DIAGNOSIS — M4004 Postural kyphosis, thoracic region: Secondary | ICD-10-CM | POA: Diagnosis not present

## 2015-01-05 DIAGNOSIS — S338XXA Sprain of other parts of lumbar spine and pelvis, initial encounter: Secondary | ICD-10-CM | POA: Diagnosis not present

## 2015-01-05 DIAGNOSIS — M47816 Spondylosis without myelopathy or radiculopathy, lumbar region: Secondary | ICD-10-CM | POA: Diagnosis not present

## 2015-01-05 DIAGNOSIS — M47812 Spondylosis without myelopathy or radiculopathy, cervical region: Secondary | ICD-10-CM | POA: Diagnosis not present

## 2015-01-22 DIAGNOSIS — M47814 Spondylosis without myelopathy or radiculopathy, thoracic region: Secondary | ICD-10-CM | POA: Diagnosis not present

## 2015-01-22 DIAGNOSIS — M5136 Other intervertebral disc degeneration, lumbar region: Secondary | ICD-10-CM | POA: Diagnosis not present

## 2015-01-22 DIAGNOSIS — M5116 Intervertebral disc disorders with radiculopathy, lumbar region: Secondary | ICD-10-CM | POA: Diagnosis not present

## 2015-01-22 DIAGNOSIS — S338XXA Sprain of other parts of lumbar spine and pelvis, initial encounter: Secondary | ICD-10-CM | POA: Diagnosis not present

## 2015-01-22 DIAGNOSIS — M47812 Spondylosis without myelopathy or radiculopathy, cervical region: Secondary | ICD-10-CM | POA: Diagnosis not present

## 2015-01-22 DIAGNOSIS — M4004 Postural kyphosis, thoracic region: Secondary | ICD-10-CM | POA: Diagnosis not present

## 2015-01-22 DIAGNOSIS — M531 Cervicobrachial syndrome: Secondary | ICD-10-CM | POA: Diagnosis not present

## 2015-01-22 DIAGNOSIS — M5442 Lumbago with sciatica, left side: Secondary | ICD-10-CM | POA: Diagnosis not present

## 2015-01-22 DIAGNOSIS — M9901 Segmental and somatic dysfunction of cervical region: Secondary | ICD-10-CM | POA: Diagnosis not present

## 2015-01-22 DIAGNOSIS — M9903 Segmental and somatic dysfunction of lumbar region: Secondary | ICD-10-CM | POA: Diagnosis not present

## 2015-01-22 DIAGNOSIS — M9902 Segmental and somatic dysfunction of thoracic region: Secondary | ICD-10-CM | POA: Diagnosis not present

## 2015-01-22 DIAGNOSIS — M47816 Spondylosis without myelopathy or radiculopathy, lumbar region: Secondary | ICD-10-CM | POA: Diagnosis not present

## 2015-01-26 ENCOUNTER — Ambulatory Visit (INDEPENDENT_AMBULATORY_CARE_PROVIDER_SITE_OTHER): Payer: Medicare Other | Admitting: *Deleted

## 2015-01-26 DIAGNOSIS — Z7901 Long term (current) use of anticoagulants: Secondary | ICD-10-CM | POA: Diagnosis not present

## 2015-01-26 DIAGNOSIS — Z5181 Encounter for therapeutic drug level monitoring: Secondary | ICD-10-CM | POA: Diagnosis not present

## 2015-01-26 DIAGNOSIS — I4891 Unspecified atrial fibrillation: Secondary | ICD-10-CM | POA: Diagnosis not present

## 2015-01-26 LAB — POCT INR: INR: 2.2

## 2015-01-29 ENCOUNTER — Other Ambulatory Visit: Payer: Self-pay | Admitting: Cardiology

## 2015-02-09 DIAGNOSIS — Z1231 Encounter for screening mammogram for malignant neoplasm of breast: Secondary | ICD-10-CM | POA: Diagnosis not present

## 2015-02-22 ENCOUNTER — Other Ambulatory Visit: Payer: Self-pay | Admitting: *Deleted

## 2015-02-22 DIAGNOSIS — I10 Essential (primary) hypertension: Secondary | ICD-10-CM | POA: Diagnosis not present

## 2015-02-22 DIAGNOSIS — C3490 Malignant neoplasm of unspecified part of unspecified bronchus or lung: Secondary | ICD-10-CM

## 2015-02-23 DIAGNOSIS — Z23 Encounter for immunization: Secondary | ICD-10-CM | POA: Diagnosis not present

## 2015-02-25 DIAGNOSIS — Z1289 Encounter for screening for malignant neoplasm of other sites: Secondary | ICD-10-CM | POA: Diagnosis not present

## 2015-03-01 ENCOUNTER — Other Ambulatory Visit: Payer: Self-pay | Admitting: Cardiology

## 2015-03-03 ENCOUNTER — Ambulatory Visit
Admission: RE | Admit: 2015-03-03 | Discharge: 2015-03-03 | Disposition: A | Discharge status: Home / Self Care | Payer: Medicare Other | Source: Ambulatory Visit | Attending: Cardiothoracic Surgery | Admitting: Cardiothoracic Surgery

## 2015-03-03 ENCOUNTER — Encounter: Payer: Self-pay | Admitting: Cardiothoracic Surgery

## 2015-03-03 ENCOUNTER — Ambulatory Visit (INDEPENDENT_AMBULATORY_CARE_PROVIDER_SITE_OTHER): Payer: Medicare Other | Admitting: Cardiothoracic Surgery

## 2015-03-03 VITALS — BP 130/76 | HR 75 | Resp 20 | Ht 63.0 in | Wt 118.0 lb

## 2015-03-03 DIAGNOSIS — C3490 Malignant neoplasm of unspecified part of unspecified bronchus or lung: Secondary | ICD-10-CM

## 2015-03-03 DIAGNOSIS — I6523 Occlusion and stenosis of bilateral carotid arteries: Secondary | ICD-10-CM

## 2015-03-03 DIAGNOSIS — Z85118 Personal history of other malignant neoplasm of bronchus and lung: Secondary | ICD-10-CM | POA: Diagnosis not present

## 2015-03-03 DIAGNOSIS — Z902 Acquired absence of lung [part of]: Secondary | ICD-10-CM | POA: Diagnosis not present

## 2015-03-03 DIAGNOSIS — J90 Pleural effusion, not elsewhere classified: Secondary | ICD-10-CM | POA: Diagnosis not present

## 2015-03-03 DIAGNOSIS — R918 Other nonspecific abnormal finding of lung field: Secondary | ICD-10-CM | POA: Diagnosis not present

## 2015-03-03 NOTE — Progress Notes (Signed)
[address]       [place] [address]             [phone]                            Natalie Carlson [place] Medical Record #[id number] Date of Birth: [birth year]  [name], MD [name], MD  Chief Complaint:   PostOp Follow Up Visit PROCEDURE PERFORMED 05/31/2011: Bronchoscopy, right video-assisted thoracoscopy,  right upper lobe lung biopsy with right upper lobectomy and lymph node  Dissection.   adenocarcinoma the right upper lobe pT1b, pN0, cM0   EGFR positive   Adenocarcinoma of lung   Primary site: Lung (Right)   Pathologic: (T1b, N0, cM0)   Summary: (T1b, N0, cM0)  History of Present Illness:      Patient returns today for followup Ct of chest and visit after right upper lobectomy done 05/31/2011 for adenocarcinoma the right upper lobe pT1b, pN0   EGFR positive and ALK negative.  Patient originally saw Dr. Sonny Dandy, but currently her only followup oncology visits are here. Patient has had no new complaints with the exception of some back pain, she does note shortness of breath when climbing hills or steps but remains able to care for self. She's had no hemoptysis. He's had no weight loss.  History  Smoking status  . Never Smoker   Smokeless tobacco  . Never Used     Allergies  Allergen Reactions  . Doxycycline Swelling    SEVERE FACIAL SWELLING AND BURNING  . Entocort Ec [Budesonide]     Patient saw her PCP, Dr. Eula Fried and he made Dr. Laural Golden aware that he had d/c patient taking Entocort due to the side effects she was experiencing. SEVERE FACIAL BURNING  . Neurontin [Gabapentin] Swelling    Per the patient she also has itching  . Vicodin [Hydrocodone-Acetaminophen] Rash    Rash,Redness, and itching  . Adhesive [Tape] Other (See Comments)    Took skin off  . Chocolate Other (See Comments)    Causes migraines  . Codeine     REACTION: nausea  . Morphine And Related Nausea Only  . Penicillins Other (See Comments)   Childhood allergy  . Prednisone     Itching   . Uncoded Nonscreenable Allergen Other (See Comments)    rasberry   headaches  . Amoxicillin Itching and Rash  . Levaquin [Levofloxacin In D5w] Itching and Rash  . Naprosyn [Naproxen] Other (See Comments)    flush  . Tramadol Itching    Current Outpatient Prescriptions  Medication Sig Dispense Refill  . acetaminophen (TYLENOL) 325 MG tablet Take 325 mg by mouth every 6 (six) hours as needed. pain    . ALPRAZolam (XANAX) 0.5 MG tablet Take 0.5 mg by mouth every 8 (eight) hours as needed for anxiety.    . APRISO 0.375 G 24 hr capsule TAKE 2 CAPSULE BY MOUTH TWICE DAILY 120 capsule 6  . Cholecalciferol 2000 UNITS CAPS Take by mouth daily.    . diclofenac sodium (VOLTAREN) 1 % GEL Apply 1 application topically 2 (two) times daily as needed. For neck pain    . DILTIAZEM CD 180 MG 24 hr capsule TAKE 1 CAPSULE BY MOUTH EVERY DAY (DOSE INCREASE) 30 capsule 6  . furosemide (LASIX) 20 MG tablet Take 1 tablet (20 mg total) by mouth daily. 30 tablet 6  . LUTEIN PO Take by mouth  daily.    . Multiple Vitamins-Minerals (HAIR/SKIN/NAILS) TABS Take 1 tablet by mouth daily.    . Omega-3 Fatty Acids (FISH OIL) 1200 MG CAPS Take 1,200 mg by mouth daily.    . potassium chloride (K-DUR) 10 MEQ tablet Take 1 tablet (10 mEq total) by mouth daily. 30 tablet 6  . warfarin (COUMADIN) 5 MG tablet TAKE 1 AND 1/2 TABLETS BY MOUTH DAILY EXCEPT 1 TABLET ON MONDAYS, WEDNESDAYS AND FRIDAYS. 45 tablet 6   No current facility-administered medications for this visit.    Physical Exam: BP 130/76 mmHg  Pulse 75  Resp 20  Ht _0  (1.6 m)  Wt 118 lb (53.524 kg)  BMI 20.91 kg/m2  SpO2 97%  General appearance: alert, cooperative, appears stated age and no distress Neurologic: intact Heart: irregularly irregular rhythm Lungs: clear to auscultation bilaterally, there is no wheezing on physical exam Abdomen: soft, non-tender; bowel sounds normal; no masses,  no  organomegaly Extremities: extremities normal, atraumatic, no cyanosis or edema and Homans sign is negative, no sign of DVT Wound: Right chest incisions are well-healed  He has no cervical or supraclavicular adenopathy no axillary adenopathy, she does have a prominent left external jugular vein.  Wt Readings from Last 3 Encounters:  03/03/15 118 lb (53.524 kg)  11/24/14 118 lb 12.8 oz (53.887 kg)  11/03/14 117 lb 12.8 oz (53.434 kg)     Diagnostic Studies & Laboratory data:         Recent Radiology Findings: Ct Chest Wo Contrast  03/03/2015  CLINICAL DATA:  History lung cancer. Surgery in 2013. Short of breath on exertion. EXAM: CT CHEST WITHOUT CONTRAST TECHNIQUE: Multidetector CT imaging of the chest was performed following the standard protocol without IV contrast. COMPARISON:  02/26/2014 and 08/28/2013. FINDINGS: Mediastinum/Nodes: Enlargement and heterogeneity of the thyroid. Similar. Aortic and branch vessel atherosclerosis. Tortuous thoracic aorta. Moderate cardiomegaly, without pericardial effusion. LAD and right coronary artery atherosclerosis. An 8 mm right paratracheal node on image/series 18/3 measured 6 mm on the prior. Hilar regions poorly evaluated without intravenous contrast. Lungs/Pleura: Trace right-sided pleural fluid is new. Progressive pleural based nodularity within the right hemi thorax. An example area along the right lower lobe pleural space posteriorly measures 1.5 x 0.9 cm on image/series 24/4. There is also progressive nodularity along the right minor fissure on image/series 19/4. Status post right upper lobectomy. Pulmonary parenchymal nodules within both lungs are new and progressive. Index right lower lobe 5 mm nodule on image/series 18/4 measured 3 mm on the prior. Right lower lobe nodules measure up to 8 mm on image/series 37/4. These were on the order of 3-4 mm on the prior exam. Index left upper lobe pulmonary nodule measures 6 mm on image/series 23/4. This nodule  measured only 2-3 mm on the prior exam. No lobar consolidation. Upper abdomen: Normal imaged portions of the liver, spleen, stomach, pancreas, adrenal glands. Musculoskeletal: Upper lumbar degenerative disc disease. IMPRESSION: 1. Progressive right pleural and bilateral pulmonary parenchymal nodularity, consistent with recurrent or progressive metastatic disease. 2. New trace right pleural fluid. 3. Enlargement of a non pathologically enlarged pretracheal node. Indeterminate. 4.  Atherosclerosis, including within the coronary arteries. Electronically Signed   By: Abigail Miyamoto M.D.   On: 03/03/2015 15:42   Ct Chest Wo Contrast  02/26/2014   CLINICAL DATA:  Followup right upper lobe lung adenocarcinoma. Status post right upper lobectomy. Shortness of breath.  EXAM: CT CHEST WITHOUT CONTRAST  TECHNIQUE: Multidetector CT imaging of the chest was performed following  the standard protocol without IV contrast.  COMPARISON:  08/28/2013  FINDINGS: Mediastinum/Hilar Regions: No masses or pathologically enlarged lymph nodes identified.  Other Thoracic Lymphadenopathy:  None.  Lungs: Postsurgical changes in the right upper lung field remains stable. Multiple tiny diffusely scattered bilateral subcentimeter pulmonary nodules remain stable. No new or enlarging pulmonary nodules or masses identified. Scarring in the left lower lobe is also unchanged.  Pleura:  No evidence of effusion or mass.  Vascular/Cardiac:  No acute findings identified.  Other: Mild-to-moderate goiter remains stable. Tiny nonobstructive left renal calculus is also stable. Both adrenal glands are normal in appearance.  Musculoskeletal:  No suspicious bone lesions identified.  IMPRESSION: Stable exam. No evidence of recurrent or metastatic carcinoma within the thorax.   Electronically Signed   By: Earle Gell M.D.   On: 02/26/2014 08:33   Ct Super D Chest Wo Contrast  08/28/2013   CLINICAL DATA:  Right lung mass. Right lung resection in 2013. Nonsmoker.   EXAM: CT CHEST WITHOUT CONTRAST  TECHNIQUE: Multidetector CT imaging of the chest was performed using thin slice collimation for electromagnetic bronchoscopy planning purposes, without intravenous contrast.  COMPARISON:  Plain film 08/28/2013.  CT 02/27/2013.  FINDINGS: Lungs/Pleura:  Surgical changes of right upper lobectomy.  Mild thickening along the right sided fissure is not significantly changed, including on image 33.  Subpleural right lower lobe density on image 41 is similar.  Right lower lobe calcified granuloma, image 34.  A medial right lower lobe 3 mm nodule on image 48 is not readily apparent on prior exams. This may be due to differences in slice selection.  Left base scarring. Mild left upper lobe subpleural nodularity on image 33 is similar.  No pleural fluid.  Heart/Mediastinum: Diffuse thyroid enlargement which is unchanged. No well-defined dominant mass.  Tortuous descending thoracic aorta. Heart mildly enlarged and accentuated by pectus excavatum deformity. No mediastinal or definite hilar adenopathy, given limitations of unenhanced CT.  Upper Abdomen:  Normal adrenal glands.  Bones/Musculoskeletal: Moderate osteopenia. Degenerative disc disease at the L1-2 level.  IMPRESSION: 1. Surgical changes of right upper lobectomy. No evidence of locally recurrent or metastatic disease. 2. Scattered tiny pulmonary nodules, primarily similar. A possibly new right lower lobe nodule may have been obscured on prior exam secondary to slice selection. Recommend attention on follow-up.   Electronically Signed   By: Abigail Miyamoto M.D.   On: 08/28/2013 12:34    Recent Labs: Lab Results  Component Value Date   WBC 6.7 01/07/2014   HGB 14.7 01/07/2014   HCT 43.3 01/07/2014   PLT 290 01/07/2014   GLUCOSE 100* 01/07/2014   ALT 17 06/02/2011   AST 28 06/02/2011   NA 141 01/07/2014   K 4.2 01/07/2014   CL 102 01/07/2014   CREATININE 0.68 01/07/2014   BUN 14 01/07/2014   CO2 29 01/07/2014   INR 2.2  01/26/2015      Assessment / Plan:    Progressive right pleural and bilateral pulmonary parenchymal nodularity, consistent with recurrent or progressive metastatic disease To further workup what appears to be widespread pulmonary metastasis we will obtain a MRI of the brain and PET scan.  I will plan to see her back after the PET scan to determine the type and location of biopsy that will give [place] the greatest yield for confirming a diagnosis. I discussed with the patient the probable diagnosis based on the CT scan results, we discussed that originally the path and that EGFR mutation was detected.  2. A sample was sent to [facility] for EGFR mutation analysis. The results are as follows: EGFR alteration detected. Deletion in exon 19. Deletions in exon19 are associated with response of non-small cell lung carcinoma to Gefitinib or Erlotinib monotherapy.  Please see [facility] report [id number]. (JBK:mw 06/08/2011. 2. A sample was sent to [facility] for ALK testing. The results are as follows: No ALK gene rearrangement detected by FISH. (JBK:kh 06-07-11)  The patient currently lives in [place] and would like to see oncology there, we will refer the patient to Dr. Epifania Gore MD 03/03/2015 4:20 PM

## 2015-03-04 ENCOUNTER — Other Ambulatory Visit: Payer: Self-pay | Admitting: Cardiology

## 2015-03-04 ENCOUNTER — Other Ambulatory Visit: Payer: Self-pay | Admitting: *Deleted

## 2015-03-04 DIAGNOSIS — R918 Other nonspecific abnormal finding of lung field: Secondary | ICD-10-CM

## 2015-03-04 DIAGNOSIS — Z85118 Personal history of other malignant neoplasm of bronchus and lung: Secondary | ICD-10-CM

## 2015-03-09 ENCOUNTER — Encounter: Payer: Medicare Other | Admitting: Cardiothoracic Surgery

## 2015-03-09 ENCOUNTER — Ambulatory Visit (INDEPENDENT_AMBULATORY_CARE_PROVIDER_SITE_OTHER): Payer: Medicare Other | Admitting: *Deleted

## 2015-03-09 DIAGNOSIS — Z7901 Long term (current) use of anticoagulants: Secondary | ICD-10-CM | POA: Diagnosis not present

## 2015-03-09 DIAGNOSIS — Z5181 Encounter for therapeutic drug level monitoring: Secondary | ICD-10-CM

## 2015-03-09 DIAGNOSIS — I4891 Unspecified atrial fibrillation: Secondary | ICD-10-CM | POA: Diagnosis not present

## 2015-03-09 DIAGNOSIS — I480 Paroxysmal atrial fibrillation: Secondary | ICD-10-CM | POA: Diagnosis not present

## 2015-03-09 LAB — POCT INR: INR: 2.5

## 2015-03-10 ENCOUNTER — Ambulatory Visit (HOSPITAL_COMMUNITY)
Admission: RE | Admit: 2015-03-10 | Discharge: 2015-03-10 | Disposition: A | Discharge status: Home / Self Care | Payer: Medicare Other | Source: Ambulatory Visit | Attending: Cardiothoracic Surgery | Admitting: Cardiothoracic Surgery

## 2015-03-10 DIAGNOSIS — G319 Degenerative disease of nervous system, unspecified: Secondary | ICD-10-CM | POA: Diagnosis not present

## 2015-03-10 DIAGNOSIS — R51 Headache: Secondary | ICD-10-CM | POA: Diagnosis not present

## 2015-03-10 DIAGNOSIS — C3490 Malignant neoplasm of unspecified part of unspecified bronchus or lung: Secondary | ICD-10-CM | POA: Insufficient documentation

## 2015-03-10 DIAGNOSIS — Z85118 Personal history of other malignant neoplasm of bronchus and lung: Secondary | ICD-10-CM

## 2015-03-10 DIAGNOSIS — R918 Other nonspecific abnormal finding of lung field: Secondary | ICD-10-CM

## 2015-03-10 LAB — POCT I-STAT CREATININE: Creatinine, Ser: 0.7 mg/dL (ref 0.44–1.00)

## 2015-03-10 MED ORDER — GADOBENATE DIMEGLUMINE 529 MG/ML IV SOLN
10.0000 mL | Freq: Once | INTRAVENOUS | Status: AC | PRN
  Administered 2015-03-10: 10 mL via INTRAVENOUS

## 2015-03-11 ENCOUNTER — Ambulatory Visit (INDEPENDENT_AMBULATORY_CARE_PROVIDER_SITE_OTHER): Payer: Medicare Other | Admitting: Cardiothoracic Surgery

## 2015-03-11 ENCOUNTER — Encounter: Payer: Self-pay | Admitting: Cardiothoracic Surgery

## 2015-03-11 ENCOUNTER — Encounter (HOSPITAL_COMMUNITY)
Admission: RE | Admit: 2015-03-11 | Discharge: 2015-03-11 | Disposition: A | Discharge status: Home / Self Care | Payer: Medicare Other | Source: Ambulatory Visit | Attending: Cardiothoracic Surgery | Admitting: Cardiothoracic Surgery

## 2015-03-11 VITALS — BP 113/72 | HR 96 | Resp 20 | Ht 63.0 in | Wt 118.0 lb

## 2015-03-11 DIAGNOSIS — R918 Other nonspecific abnormal finding of lung field: Secondary | ICD-10-CM

## 2015-03-11 DIAGNOSIS — Z902 Acquired absence of lung [part of]: Secondary | ICD-10-CM

## 2015-03-11 DIAGNOSIS — Z85118 Personal history of other malignant neoplasm of bronchus and lung: Secondary | ICD-10-CM

## 2015-03-11 DIAGNOSIS — I6523 Occlusion and stenosis of bilateral carotid arteries: Secondary | ICD-10-CM

## 2015-03-11 MED ORDER — FLUDEOXYGLUCOSE F - 18 (FDG) INJECTION
5.8000 | Freq: Once | INTRAVENOUS | Status: DC | PRN
  Administered 2015-03-11: 5.8 via INTRAVENOUS
  Filled 2015-03-11: qty 5.8

## 2015-03-11 NOTE — Progress Notes (Signed)
[address]       [place] [address]             [phone]                            [facility] Medical Record #[id number] Date of Birth: 07/28/1929  [name], MD [name], MD  Chief Complaint:   PostOp Follow Up Visit PROCEDURE PERFORMED 05/31/2011: Bronchoscopy, right video-assisted thoracoscopy,  right upper lobe lung biopsy with right upper lobectomy and lymph node  Dissection.   adenocarcinoma the right upper lobe pT1b, pN0, cM0   EGFR positive   Adenocarcinoma of lung   Primary site: Lung (Right)   Pathologic: (T1b, N0, cM0)   Summary: (T1b, N0, cM0)  History of Present Illness:      Patient returns today for followup PET scan and MRI of the brain.  Patient had right upper lobectomy done 05/31/2011 for adenocarcinoma the right upper lobe pT1b, pN0   EGFR positive and ALK negative.  Patient originally saw Dr. Sonny Dandy, but currently her only followup oncology visits are here. Patient has had no new complaints with the exception of some back pain, she does note shortness of breath when climbing hills or steps but remains able to care for self. She's had no hemoptysis. He's had no weight loss. Follow-up CT scan last week showed evidence of bilateral pulmonary metastasis.  History  Smoking status  . Never Smoker   Smokeless tobacco  . Never Used     Allergies  Allergen Reactions  . Doxycycline Swelling    SEVERE FACIAL SWELLING AND BURNING  . Entocort Ec [Budesonide]     Patient saw her PCP, Dr. Eula Fried and he made Dr. Laural Golden aware that he had d/c patient taking Entocort due to the side effects she was experiencing. SEVERE FACIAL BURNING  . Neurontin [Gabapentin] Swelling    Per the patient she also has itching  . Vicodin [Hydrocodone-Acetaminophen] Rash    Rash,Redness, and itching  . Adhesive [Tape] Other (See Comments)    Took skin off  . Chocolate Other (See Comments)    Causes migraines  . Codeine    REACTION: nausea  . Morphine And Related Nausea Only  . Penicillins Other (See Comments)    Childhood allergy  . Prednisone     Itching   . Uncoded Nonscreenable Allergen Other (See Comments)    rasberry   headaches  . Amoxicillin Itching and Rash  . Levaquin [Levofloxacin In D5w] Itching and Rash  . Naprosyn [Naproxen] Other (See Comments)    flush  . Tramadol Itching    Current Outpatient Prescriptions  Medication Sig Dispense Refill  . acetaminophen (TYLENOL) 325 MG tablet Take 325 mg by mouth every 6 (six) hours as needed. pain    . ALPRAZolam (XANAX) 0.5 MG tablet Take 0.5 mg by mouth every 8 (eight) hours as needed for anxiety.    . APRISO 0.375 G 24 hr capsule TAKE 2 CAPSULE BY MOUTH TWICE DAILY 120 capsule 6  . Cholecalciferol 2000 UNITS CAPS Take by mouth daily.    . diclofenac sodium (VOLTAREN) 1 % GEL Apply 1 application topically 2 (two) times daily as needed. For neck pain    . DILTIAZEM CD 180 MG 24 hr capsule TAKE 1 CAPSULE BY MOUTH EVERY DAY (DOSE INCREASE) 30 capsule 6  . furosemide (LASIX) 20 MG tablet TAKE 1 TABLET BY  MOUTH EVERY DAY 30 tablet 5  . LUTEIN PO Take by mouth daily.    . Multiple Vitamins-Minerals (HAIR/SKIN/NAILS) TABS Take 1 tablet by mouth daily.    . Omega-3 Fatty Acids (FISH OIL) 1200 MG CAPS Take 1,200 mg by mouth daily.    . potassium chloride (K-DUR) 10 MEQ tablet Take 1 tablet (10 mEq total) by mouth daily. 30 tablet 6  . warfarin (COUMADIN) 5 MG tablet TAKE 1 AND 1/2 TABLETS BY MOUTH DAILY EXCEPT 1 TABLET ON MONDAYS, WEDNESDAYS AND FRIDAYS. 45 tablet 6   No current facility-administered medications for this visit.   Facility-Administered Medications Ordered in Other Visits  Medication Dose Route Frequency Provider Last Rate Last Dose  . fludeoxyglucose F - 18 (FDG) injection 5.8 milli Curie  5.8 milli Curie Intravenous Once PRN Grace Isaac, MD   5.8 milli Curie at 03/11/15 [phone]    Physical Exam: BP 113/72 mmHg  Pulse 96  Resp 20   Ht 5' 3"  (1.6 m)  Wt 118 lb (53.524 kg)  BMI 20.91 kg/m2  SpO2 95%  General appearance: alert, cooperative, appears stated age and no distress Neurologic: intact Heart: irregularly irregular rhythm Lungs: clear to auscultation bilaterally, there is no wheezing on physical exam Abdomen: soft, non-tender; bowel sounds normal; no masses,  no organomegaly Extremities: extremities normal, atraumatic, no cyanosis or edema and Homans sign is negative, no sign of DVT Wound: Right chest incisions are well-healed  He has no cervical or supraclavicular adenopathy no axillary adenopathy, she does have a prominent left external jugular vein.  Wt Readings from Last 3 Encounters:  03/11/15 118 lb (53.524 kg)  03/10/15 118 lb (53.524 kg)  03/03/15 118 lb (53.524 kg)     Diagnostic Studies & Laboratory data:         Recent Radiology Findings: Mr Kizzie Fantasia Contrast  03/10/2015  CLINICAL DATA:  Lung cancer.  Pulmonary nodules. EXAM: MRI HEAD WITHOUT AND WITH CONTRAST TECHNIQUE: Multiplanar, multiecho pulse sequences of the brain and surrounding structures were obtained without and with intravenous contrast. CONTRAST:  28m MULTIHANCE GADOBENATE DIMEGLUMINE 529 MG/ML IV SOLN COMPARISON:  MRI brain 05/29/2011 FINDINGS: A remote right temporal lobe infarct is again seen. Extensive periventricular and subcortical white matter disease is present bilaterally. Diffuse dilated perivascular spaces throughout the basal ganglia are stable. Remote infarcts in the thalami are again noted. No acute infarct, hemorrhage, or mass lesion is present. The ventricles are proportionate to the degree of atrophy. No significant extra-axial fluid collection is present. The skullbase is within normal limits. Midline structures are unremarkable. The postcontrast images demonstrate no pathologic enhancement. IMPRESSION: 1. No acute intracranial abnormality or significant interval change. 2. No evidence for metastatic disease to the  brain. 3. Stable atrophy and diffuse white matter disease. This likely reflects the sequela of chronic microvascular ischemia. 4. Remote lacunar infarcts within the thalami bilaterally. Electronically Signed   By: CSan MorelleM.D.   On: 03/10/2015 18:42   Nm Pet Image Initial (pi) Skull Base To Thigh  03/11/2015  CLINICAL DATA:  Initial treatment strategy for pulmonary nodule. History of lung cancer surgery in 2013. RIGHT upper lobectomy done 05/31/2011 for adenocarcinoma the right upper lobe pT1b, pN0 EGFR positive and ALK negative. EXAM: NUCLEAR MEDICINE PET SKULL BASE TO THIGH TECHNIQUE: 5.8 mCi F-18 FDG was injected intravenously. Full-ring PET imaging was performed from the skull base to thigh after the radiotracer. CT data was obtained and used for attenuation correction and anatomic localization. FASTING BLOOD  GLUCOSE:  Value: 96 mg/dl COMPARISON:  CT 03/03/2015, CT 02/26/2014, PET-CT 05/04/2011 FINDINGS: NECK No hypermetabolic lymph nodes in the neck. Diffuse hypermetabolic activity within thyroid gland again noted. CHEST No hypermetabolic mediastinal or hilar nodes. No suspicious pulmonary nodules on the CT scan. Multipulmonary nodules within RIGHT lower lobe within the parenchyma and along the pleural surfaces. Example nodule in the posterior RIGHT lower lobe measuring 13 mm along the pleural surface on image 62, series 4 has associated metabolic activity with SUV max equal 4.3. Cluster of nodules along the horizontal fissure on image 72 with SUV max 2.8. A parenchymal nodule in the RIGHT lower lobe measuring 8 mm in image 73 with SUV max equal 2.0. There is a subtle rind of metabolic activity within the RIGHT hemi thorax extending along the margin of the liver which is consistent with extensive pleural metastasis. Within the LEFT upper lobe there is a 6 mm nodule with mild associated metabolic activity. There additional pulmonary nodules within the LEFT upper lobe and LEFT lower lobe. There  is mild metabolic activity associated the pleural surface adjacent to the costovertebral junction within the LEFT hemi thorax which could indicate a subtle pleural metastasis not detected by CT. 8 mm precarinal lymph node on image 53 has intense metabolic activity for size SUV max equal 7.0. Hypermetabolic subcarinal lymph node with SUV max equal 6.3 ABDOMEN/PELVIS No abnormal hypermetabolic activity within the liver, pancreas, adrenal glands, or spleen. No hypermetabolic lymph nodes in the abdomen or pelvis. SKELETON No focal hypermetabolic activity to suggest skeletal metastasis. IMPRESSION: 1. Bilateral hypermetabolic pulmonary nodules consistent with pulmonary metastasis, much greater on the RIGHT. 2. Extensive pleural metastasis within the RIGHT hemi thorax and suspicion for pleural involvement in the LEFT hemi thorax. 3. Hypermetabolic mediastinal nodal metastasis to the pre carinal and subcarinal nodal stations. 4. No evidence of metastasis below the hemidiaphragms. 5. Diffuse thyroid hypermetabolic activity consistent with thyroiditis. Electronically Signed   By: Suzy Bouchard M.D.   On: 03/11/2015 09:38   Ct Chest Wo Contrast  03/03/2015  CLINICAL DATA:  History lung cancer. Surgery in 2013. Short of breath on exertion. EXAM: CT CHEST WITHOUT CONTRAST TECHNIQUE: Multidetector CT imaging of the chest was performed following the standard protocol without IV contrast. COMPARISON:  02/26/2014 and 08/28/2013. FINDINGS: Mediastinum/Nodes: Enlargement and heterogeneity of the thyroid. Similar. Aortic and branch vessel atherosclerosis. Tortuous thoracic aorta. Moderate cardiomegaly, without pericardial effusion. LAD and right coronary artery atherosclerosis. An 8 mm right paratracheal node on image/series 18/3 measured 6 mm on the prior. Hilar regions poorly evaluated without intravenous contrast. Lungs/Pleura: Trace right-sided pleural fluid is new. Progressive pleural based nodularity within the right hemi  thorax. An example area along the right lower lobe pleural space posteriorly measures 1.5 x 0.9 cm on image/series 24/4. There is also progressive nodularity along the right minor fissure on image/series 19/4. Status post right upper lobectomy. Pulmonary parenchymal nodules within both lungs are new and progressive. Index right lower lobe 5 mm nodule on image/series 18/4 measured 3 mm on the prior. Right lower lobe nodules measure up to 8 mm on image/series 37/4. These were on the order of 3-4 mm on the prior exam. Index left upper lobe pulmonary nodule measures 6 mm on image/series 23/4. This nodule measured only 2-3 mm on the prior exam. No lobar consolidation. Upper abdomen: Normal imaged portions of the liver, spleen, stomach, pancreas, adrenal glands. Musculoskeletal: Upper lumbar degenerative disc disease. IMPRESSION: 1. Progressive right pleural and bilateral pulmonary parenchymal nodularity,  consistent with recurrent or progressive metastatic disease. 2. New trace right pleural fluid. 3. Enlargement of a non pathologically enlarged pretracheal node. Indeterminate. 4.  Atherosclerosis, including within the coronary arteries. Electronically Signed   By: Abigail Miyamoto M.D.   On: 03/03/2015 15:42   Ct Chest Wo Contrast  02/26/2014   CLINICAL DATA:  Followup right upper lobe lung adenocarcinoma. Status post right upper lobectomy. Shortness of breath.  EXAM: CT CHEST WITHOUT CONTRAST  TECHNIQUE: Multidetector CT imaging of the chest was performed following the standard protocol without IV contrast.  COMPARISON:  08/28/2013  FINDINGS: Mediastinum/Hilar Regions: No masses or pathologically enlarged lymph nodes identified.  Other Thoracic Lymphadenopathy:  None.  Lungs: Postsurgical changes in the right upper lung field remains stable. Multiple tiny diffusely scattered bilateral subcentimeter pulmonary nodules remain stable. No new or enlarging pulmonary nodules or masses identified. Scarring in the left lower  lobe is also unchanged.  Pleura:  No evidence of effusion or mass.  Vascular/Cardiac:  No acute findings identified.  Other: Mild-to-moderate goiter remains stable. Tiny nonobstructive left renal calculus is also stable. Both adrenal glands are normal in appearance.  Musculoskeletal:  No suspicious bone lesions identified.  IMPRESSION: Stable exam. No evidence of recurrent or metastatic carcinoma within the thorax.   Electronically Signed   By: Earle Gell M.D.   On: 02/26/2014 08:33   Ct Super D Chest Wo Contrast  08/28/2013   CLINICAL DATA:  Right lung mass. Right lung resection in 2013. Nonsmoker.  EXAM: CT CHEST WITHOUT CONTRAST  TECHNIQUE: Multidetector CT imaging of the chest was performed using thin slice collimation for electromagnetic bronchoscopy planning purposes, without intravenous contrast.  COMPARISON:  Plain film 08/28/2013.  CT 02/27/2013.  FINDINGS: Lungs/Pleura:  Surgical changes of right upper lobectomy.  Mild thickening along the right sided fissure is not significantly changed, including on image 33.  Subpleural right lower lobe density on image 41 is similar.  Right lower lobe calcified granuloma, image 34.  A medial right lower lobe 3 mm nodule on image 48 is not readily apparent on prior exams. This may be due to differences in slice selection.  Left base scarring. Mild left upper lobe subpleural nodularity on image 33 is similar.  No pleural fluid.  Heart/Mediastinum: Diffuse thyroid enlargement which is unchanged. No well-defined dominant mass.  Tortuous descending thoracic aorta. Heart mildly enlarged and accentuated by pectus excavatum deformity. No mediastinal or definite hilar adenopathy, given limitations of unenhanced CT.  Upper Abdomen:  Normal adrenal glands.  Bones/Musculoskeletal: Moderate osteopenia. Degenerative disc disease at the L1-2 level.  IMPRESSION: 1. Surgical changes of right upper lobectomy. No evidence of locally recurrent or metastatic disease. 2. Scattered tiny  pulmonary nodules, primarily similar. A possibly new right lower lobe nodule may have been obscured on prior exam secondary to slice selection. Recommend attention on follow-up.   Electronically Signed   By: Abigail Miyamoto M.D.   On: 08/28/2013 12:34    Recent Labs: Lab Results  Component Value Date   WBC 6.7 01/07/2014   HGB 14.7 01/07/2014   HCT 43.3 01/07/2014   PLT 290 01/07/2014   GLUCOSE 100* 01/07/2014   ALT 17 06/02/2011   AST 28 06/02/2011   NA 141 01/07/2014   K 4.2 01/07/2014   CL 102 01/07/2014   CREATININE 0.70 03/10/2015   BUN 14 01/07/2014   CO2 29 01/07/2014   INR 2.5 03/09/2015      Assessment / Plan:    Progressive right pleural and  bilateral pulmonary parenchymal nodularity, consistent with recurrent or progressive metastatic disease To further workup what appears to be widespread pulmonary metastasis a MRI of the brain and PET scan has been done.  Patient discussed at the multidisciplinary thoracic oncology conference. The consensus was that considered the radiographic findings and patient's history additional biopsies would not be necessary and we could proceed directly with treatment with appropriate oral agent for her known EGFR mutation. Initially the patient was concerned about traveling back and forth from [facility] for treatment but with the option of oral agent to be taken at home she was willing to come [facility]. We'll make arrangements for the patient be seen by Dr. Inda Merlin. I've reviewed with the patient the MRI and PET scan results   2. A sample was sent to [facility] for EGFR mutation analysis. The results are as follows: EGFR alteration detected. Deletion in exon 19. Deletions in exon19 are associated with response of non-small cell lung carcinoma to Gefitinib or Erlotinib monotherapy.  Please see [facility] report [id number]. (JBK:mw 06/08/2011. 2. A sample was sent to [facility] for ALK testing. The results are as follows: No ALK gene  rearrangement detected by FISH. (JBK:kh 06-07-11)    Grace Isaac MD 03/11/2015 10:50 AM

## 2015-03-12 LAB — GLUCOSE, CAPILLARY: Glucose-Capillary: 96 mg/dL (ref 65–99)

## 2015-03-15 ENCOUNTER — Telehealth: Payer: Self-pay | Admitting: *Deleted

## 2015-03-15 DIAGNOSIS — C3491 Malignant neoplasm of unspecified part of right bronchus or lung: Secondary | ICD-10-CM

## 2015-03-15 NOTE — Telephone Encounter (Signed)
Oncology Nurse Navigator Documentation  Oncology Nurse Navigator Flowsheets 03/15/2015  Referral date to RadOnc/MedOnc 03/12/2015  Navigator Encounter Type Introductory phone call/recieved referral from Dr. Servando Snare.  I follow up with Dr. Julien Nordmann today about scheduling.  I called patient and scheduled her to be seen on 03/22/15 arrive at 1:45.  She verbalized understanding of appt time and place.   Patient Visit Type Initial  Treatment Phase Other  Interventions Coordination of Care  Coordination of Care MD Appointments  Time Spent with Patient 15

## 2015-03-22 ENCOUNTER — Ambulatory Visit (HOSPITAL_BASED_OUTPATIENT_CLINIC_OR_DEPARTMENT_OTHER): Payer: Medicare Other | Admitting: Internal Medicine

## 2015-03-22 ENCOUNTER — Telehealth: Payer: Self-pay | Admitting: Internal Medicine

## 2015-03-22 ENCOUNTER — Encounter: Payer: Self-pay | Admitting: Internal Medicine

## 2015-03-22 ENCOUNTER — Other Ambulatory Visit (HOSPITAL_BASED_OUTPATIENT_CLINIC_OR_DEPARTMENT_OTHER): Payer: Medicare Other

## 2015-03-22 VITALS — BP 137/70 | HR 80 | Temp 97.8°F | Resp 18 | Ht 63.0 in | Wt 120.0 lb

## 2015-03-22 DIAGNOSIS — C3491 Malignant neoplasm of unspecified part of right bronchus or lung: Secondary | ICD-10-CM | POA: Diagnosis not present

## 2015-03-22 LAB — CBC WITH DIFFERENTIAL/PLATELET
BASO%: 0.6 % (ref 0.0–2.0)
Basophils Absolute: 0 10*3/uL (ref 0.0–0.1)
EOS%: 2 % (ref 0.0–7.0)
Eosinophils Absolute: 0.1 10*3/uL (ref 0.0–0.5)
HEMATOCRIT: 43.2 % (ref 34.8–46.6)
HGB: 14.4 g/dL (ref 11.6–15.9)
LYMPH#: 1.5 10*3/uL (ref 0.9–3.3)
LYMPH%: 29.9 % (ref 14.0–49.7)
MCH: 30.6 pg (ref 25.1–34.0)
MCHC: 33.3 g/dL (ref 31.5–36.0)
MCV: 91.7 fL (ref 79.5–101.0)
MONO#: 0.9 10*3/uL (ref 0.1–0.9)
MONO%: 17 % — ABNORMAL HIGH (ref 0.0–14.0)
NEUT#: 2.6 10*3/uL (ref 1.5–6.5)
NEUT%: 50.5 % (ref 38.4–76.8)
Platelets: 251 10*3/uL (ref 145–400)
RBC: 4.71 10*6/uL (ref 3.70–5.45)
RDW: 13.3 % (ref 11.2–14.5)
WBC: 5.1 10*3/uL (ref 3.9–10.3)

## 2015-03-22 LAB — COMPREHENSIVE METABOLIC PANEL (CC13)
ALT: 14 U/L (ref 0–55)
AST: 22 U/L (ref 5–34)
Albumin: 3.6 g/dL (ref 3.5–5.0)
Alkaline Phosphatase: 110 U/L (ref 40–150)
Anion Gap: 8 mEq/L (ref 3–11)
BUN: 12.4 mg/dL (ref 7.0–26.0)
CALCIUM: 9.6 mg/dL (ref 8.4–10.4)
CHLORIDE: 106 meq/L (ref 98–109)
CO2: 25 mEq/L (ref 22–29)
Creatinine: 0.7 mg/dL (ref 0.6–1.1)
EGFR: 79 mL/min/{1.73_m2} — ABNORMAL LOW (ref >90)
Glucose: 95 mg/dl (ref 70–140)
POTASSIUM: 4.5 meq/L (ref 3.5–5.1)
SODIUM: 139 meq/L (ref 136–145)
Total Bilirubin: 0.44 mg/dL (ref 0.20–1.20)
Total Protein: 6.9 g/dL (ref 6.4–8.3)

## 2015-03-22 NOTE — Progress Notes (Signed)
[place] Telephone:([phone]   Fax:([phone]) [phone]  CONSULT NOTE  REFERRING PHYSICIAN: Dr. Lanelle Bal  REASON FOR CONSULTATION:  81 years old white female with recurrent lung cancer  HPI Natalie Carlson is a 81 y.o. female a never smoker with past medical history significant for atrial fibrillation, congestive heart failure, hypothyroidism, history of kidney stone. The patient was seen in the emergency department in December 2012 complaining of abdominal pain and during her evaluation she was found to have an nodule and the right upper lobe of the lung. CT angiogram of the chest on 04/22/2011 at [facility] in [place] showed right upper lobe soft tissue nodule measuring 2.1 x 2.2 x 3.0 cm. A PET scan on 05/04/2011 showed right upper lobe nodular airspace consolidation with borderline malignant hypermetabolism worrisome for low-grade adenocarcinoma. On 05/31/2011 the patient underwent bronchoscopy with right vats and right upper lobectomy with lymph node dissection under the care of Dr. Servando Snare. The final pathology showed well-differentiated adenocarcinoma spanning 2.2 cm with negative resection margin and the dissected lymph nodes were negative for malignancy. There was some evidence for lymphovascular invasion but no visceral pleural invasion. Studies at that time showed positive EGFR mutation with deletion in exon 58. The patient was followed by observation under the care of Dr. Servando Snare with CT scan of the chest every 6 months for a total of 2 years, then annually after that.  Repeat CT scan of the chest without contrast on 03/03/2015 showed progressive pleural-based nodularity within the right hemithorax including an area along the right lower lobe pleural space posteriorly measured 1.5 x 0.9 cm. There was also progressive nodularity along the right minor fissure. There was also pulmonary parenchymal nodules within both lungs that are new and progressive including  index right lower lobe 0.5 cm nodule and a right lower lobe nodules measure up to 0.8 cm increased in size from the previous exam. There was also left upper lobe pulmonary nodule measuring 0.6 cm. A PET scan on 03/11/2015 showed bilateral hypermetabolic pulmonary nodules consistent with pulmonary metastasis much greater on the right. There was also extensive pleural based metastases within the right hemithorax and suspicion for dural involvement in the left hemothorax. There was also hypermetabolic mediastinal nodal metastasis to the precarinal and subcarinal nodal stations. There was no evidence of metastatic disease below the diaphragm. MRI of the brain on 03/10/2015 showed no evidence for metastatic disease to the brain. Dr. Servando Snare kindly referred the patient to me today for further evaluation and recommendation regarding treatment of her condition. When she is seen today she has no specific complaints except for mild cough but no significant chest pain or shortness breath. She has no hemoptysis. She denied having any significant weight loss or night sweats. No headache or visual changes. Family history significant for mother and father with a stroke she also has 3 sisters with a stroke and a sister with uterine cancer as well as son with lung cancer. The patient is a widow and has 2 living children. She lives in [place]. She used to work in [employer]. She denied having any history of smoking, alcohol or drug abuse. HPI  Past Medical History  Diagnosis Date  . Hypothyroidism   . Cataract of both eyes   . Mucus in stool   . Chronic diarrhea   . Hyperlipidemia   . Colitis   . History of colon polyps   . History of migraines   . Arthritis   .  Chronic back pain   . Hemorrhoids   . Macular degeneration, dry   . Adenocarcinoma of lung ([place])     Stage  1A  EGFR positive  ALK-negative   . Atrial fibrillation ([place])   . Nephrolithiasis     1994 - passed on its on     Past Surgical History  Procedure Laterality Date  . Cataract extraction  2010/2011  . Bladder tack  1996  . Colonoscopy  3 YRS AGO  . Thyroid surgery  1961  . Colonoscopy  03/06/2011    Procedure: COLONOSCOPY;  Surgeon: Rogene Houston, MD;  [facility]: [facility];  Service: Endoscopy;  Laterality: N/A;  7:30 am  . Tonsillectomy    . Esophagogastroduodenoscopy    . Cardioversion  11/02/09    x 2  . Video bronchoscopy  05/31/2011    Procedure: VIDEO BRONCHOSCOPY;  Surgeon: Grace Isaac, MD;  Location: [facility];  Service: Thoracic;  Laterality: N/A;  . Flexible sigmoidoscopy N/A 10/04/2012    Procedure: FLEXIBLE SIGMOIDOSCOPY;  Surgeon: Rogene Houston, MD;  Location: [facility];  Service: Endoscopy;  Laterality: N/A;  11:00-moved to 1015 Ann to notify pt    Family History  Problem Relation Age of Onset  . Stroke Mother   . Stroke Father   . Stroke Other     two siblings  . Heart disease Other     sibling  . Liver disease Paternal Grandmother   . Anesthesia problems Sister   . Hypotension Neg Hx   . Malignant hyperthermia Neg Hx   . Pseudochol deficiency Neg Hx     Social History Social History  Substance Use Topics  . Smoking status: Never Smoker   . Smokeless tobacco: Never Used  . Alcohol Use: No    Allergies  Allergen Reactions  . Doxycycline Swelling    SEVERE FACIAL SWELLING AND BURNING  . Entocort Ec [Budesonide]     Patient saw her PCP, Dr. Eula Fried and he made Dr. Laural Golden aware that he had d/c patient taking Entocort due to the side effects she was experiencing. SEVERE FACIAL BURNING  . Neurontin [Gabapentin] Swelling    Per the patient she also has itching  . Vicodin [Hydrocodone-Acetaminophen] Rash    Rash,Redness, and itching  . Adhesive [Tape] Other (See Comments)    Took skin off  . Chocolate Other (See Comments)    Causes migraines  . Codeine     REACTION: nausea  . Morphine And Related Nausea Only  . Penicillins Other (See Comments)     Childhood allergy  . Prednisone     Itching   . Uncoded Nonscreenable Allergen Other (See Comments)    rasberry   headaches  . Amoxicillin Itching and Rash  . Levaquin [Levofloxacin In D5w] Itching and Rash  . Naprosyn [Naproxen] Other (See Comments)    flush  . Tramadol Itching    Current Outpatient Prescriptions  Medication Sig Dispense Refill  . acetaminophen (TYLENOL) 325 MG tablet Take 325 mg by mouth every 6 (six) hours as needed. pain    . ALPRAZolam (XANAX) 0.5 MG tablet Take 0.5 mg by mouth every 8 (eight) hours as needed for anxiety.    . APRISO 0.375 G 24 hr capsule TAKE 2 CAPSULE BY MOUTH TWICE DAILY 120 capsule 6  . Cholecalciferol 2000 UNITS CAPS Take by mouth daily.    . diclofenac sodium (VOLTAREN) 1 % GEL Apply 1 application topically 2 (two) times daily as needed. For neck  pain    . DILTIAZEM CD 180 MG 24 hr capsule TAKE 1 CAPSULE BY MOUTH EVERY DAY (DOSE INCREASE) 30 capsule 6  . furosemide (LASIX) 20 MG tablet TAKE 1 TABLET BY MOUTH EVERY DAY 30 tablet 5  . LUTEIN PO Take by mouth daily.    . Multiple Vitamins-Minerals (HAIR/SKIN/NAILS) TABS Take 1 tablet by mouth daily.    . Omega-3 Fatty Acids (FISH OIL) 1200 MG CAPS Take 1,200 mg by mouth daily.    . potassium chloride (K-DUR) 10 MEQ tablet Take 1 tablet (10 mEq total) by mouth daily. 30 tablet 6  . warfarin (COUMADIN) 5 MG tablet TAKE 1 AND 1/2 TABLETS BY MOUTH DAILY EXCEPT 1 TABLET ON MONDAYS, WEDNESDAYS AND FRIDAYS. 45 tablet 6   No current facility-administered medications for this visit.    Review of Systems  Constitutional: negative Eyes: negative Ears, nose, mouth, throat, and face: negative Respiratory: positive for cough Cardiovascular: negative Gastrointestinal: negative Genitourinary:negative Integument/breast: negative Hematologic/lymphatic: negative Musculoskeletal:negative Neurological: negative Behavioral/Psych: negative Endocrine: negative Allergic/Immunologic: negative  Physical  Exam  MGQ:QPYPP, healthy, no distress, well nourished and well developed SKIN: skin color, texture, turgor are normal, no rashes or significant lesions HEAD: Normocephalic, No masses, lesions, tenderness or abnormalities EYES: normal, PERRLA, Conjunctiva are pink and non-injected EARS: External ears normal, Canals clear OROPHARYNX:no exudate, no erythema and lips, buccal mucosa, and tongue normal  NECK: supple, no adenopathy, no JVD LYMPH:  no palpable lymphadenopathy, no hepatosplenomegaly BREAST:not examined LUNGS: clear to auscultation , and palpation HEART: regular rate & rhythm, no murmurs and no gallops ABDOMEN:abdomen soft, non-tender, normal bowel sounds and no masses or organomegaly BACK: Back symmetric, no curvature., No CVA tenderness EXTREMITIES:no joint deformities, effusion, or inflammation, no edema, no skin discoloration  NEURO: alert & oriented x 3 with fluent speech, no focal motor/sensory deficits  PERFORMANCE STATUS: ECOG 1  LABORATORY DATA: Lab Results  Component Value Date   WBC 5.1 03/22/2015   HGB 14.4 03/22/2015   HCT 43.2 03/22/2015   MCV 91.7 03/22/2015   PLT 251 03/22/2015      Chemistry      Component Value Date/Time   NA 139 03/22/2015 1339   NA 141 01/07/2014 1636   K 4.5 03/22/2015 1339   K 4.2 01/07/2014 1636   CL 102 01/07/2014 1636   CO2 25 03/22/2015 1339   CO2 29 01/07/2014 1636   BUN 12.4 03/22/2015 1339   BUN 14 01/07/2014 1636   CREATININE 0.7 03/22/2015 1339   CREATININE 0.70 03/10/2015 1706   CREATININE 0.64 07/17/2012 0950      Component Value Date/Time   CALCIUM 9.6 03/22/2015 1339   CALCIUM 9.1 01/07/2014 1636   ALKPHOS 110 03/22/2015 1339   ALKPHOS 58 06/02/2011 0400   AST 22 03/22/2015 1339   AST 28 06/02/2011 0400   ALT 14 03/22/2015 1339   ALT 17 06/02/2011 0400   BILITOT 0.44 03/22/2015 1339   BILITOT 0.5 06/02/2011 0400       RADIOGRAPHIC STUDIES: Ct Chest Wo Contrast  03/03/2015  CLINICAL DATA:   History lung cancer. Surgery in 2013. Short of breath on exertion. EXAM: CT CHEST WITHOUT CONTRAST TECHNIQUE: Multidetector CT imaging of the chest was performed following the standard protocol without IV contrast. COMPARISON:  02/26/2014 and 08/28/2013. FINDINGS: Mediastinum/Nodes: Enlargement and heterogeneity of the thyroid. Similar. Aortic and branch vessel atherosclerosis. Tortuous thoracic aorta. Moderate cardiomegaly, without pericardial effusion. LAD and right coronary artery atherosclerosis. An 8 mm right paratracheal node on image/series 18/3  measured 6 mm on the prior. Hilar regions poorly evaluated without intravenous contrast. Lungs/Pleura: Trace right-sided pleural fluid is new. Progressive pleural based nodularity within the right hemi thorax. An example area along the right lower lobe pleural space posteriorly measures 1.5 x 0.9 cm on image/series 24/4. There is also progressive nodularity along the right minor fissure on image/series 19/4. Status post right upper lobectomy. Pulmonary parenchymal nodules within both lungs are new and progressive. Index right lower lobe 5 mm nodule on image/series 18/4 measured 3 mm on the prior. Right lower lobe nodules measure up to 8 mm on image/series 37/4. These were on the order of 3-4 mm on the prior exam. Index left upper lobe pulmonary nodule measures 6 mm on image/series 23/4. This nodule measured only 2-3 mm on the prior exam. No lobar consolidation. Upper abdomen: Normal imaged portions of the liver, spleen, stomach, pancreas, adrenal glands. Musculoskeletal: Upper lumbar degenerative disc disease. IMPRESSION: 1. Progressive right pleural and bilateral pulmonary parenchymal nodularity, consistent with recurrent or progressive metastatic disease. 2. New trace right pleural fluid. 3. Enlargement of a non pathologically enlarged pretracheal node. Indeterminate. 4.  Atherosclerosis, including within the coronary arteries. Electronically Signed   By: Abigail Miyamoto M.D.   On: 03/03/2015 15:42   Mr Jeri Cos VQ Contrast  03/10/2015  CLINICAL DATA:  Lung cancer.  Pulmonary nodules. EXAM: MRI HEAD WITHOUT AND WITH CONTRAST TECHNIQUE: Multiplanar, multiecho pulse sequences of the brain and surrounding structures were obtained without and with intravenous contrast. CONTRAST:  32m MULTIHANCE GADOBENATE DIMEGLUMINE 529 MG/ML IV SOLN COMPARISON:  MRI brain 05/29/2011 FINDINGS: A remote right temporal lobe infarct is again seen. Extensive periventricular and subcortical white matter disease is present bilaterally. Diffuse dilated perivascular spaces throughout the basal ganglia are stable. Remote infarcts in the thalami are again noted. No acute infarct, hemorrhage, or mass lesion is present. The ventricles are proportionate to the degree of atrophy. No significant extra-axial fluid collection is present. The skullbase is within normal limits. Midline structures are unremarkable. The postcontrast images demonstrate no pathologic enhancement. IMPRESSION: 1. No acute intracranial abnormality or significant interval change. 2. No evidence for metastatic disease to the brain. 3. Stable atrophy and diffuse white matter disease. This likely reflects the sequela of chronic microvascular ischemia. 4. Remote lacunar infarcts within the thalami bilaterally. Electronically Signed   By: CSan MorelleM.D.   On: 03/10/2015 18:42   Nm Pet Image Initial (pi) Skull Base To Thigh  03/11/2015  CLINICAL DATA:  Initial treatment strategy for pulmonary nodule. History of lung cancer surgery in 2013. RIGHT upper lobectomy done 05/31/2011 for adenocarcinoma the right upper lobe pT1b, pN0 EGFR positive and ALK negative. EXAM: NUCLEAR MEDICINE PET SKULL BASE TO THIGH TECHNIQUE: 5.8 mCi F-18 FDG was injected intravenously. Full-ring PET imaging was performed from the skull base to thigh after the radiotracer. CT data was obtained and used for attenuation correction and anatomic  localization. FASTING BLOOD GLUCOSE:  Value: 96 mg/dl COMPARISON:  CT 03/03/2015, CT 02/26/2014, PET-CT 05/04/2011 FINDINGS: NECK No hypermetabolic lymph nodes in the neck. Diffuse hypermetabolic activity within thyroid gland again noted. CHEST No hypermetabolic mediastinal or hilar nodes. No suspicious pulmonary nodules on the CT scan. Multipulmonary nodules within RIGHT lower lobe within the parenchyma and along the pleural surfaces. Example nodule in the posterior RIGHT lower lobe measuring 13 mm along the pleural surface on image 62, series 4 has associated metabolic activity with SUV max equal 4.3. Cluster of nodules along the horizontal fissure  on image 72 with SUV max 2.8. A parenchymal nodule in the RIGHT lower lobe measuring 8 mm in image 73 with SUV max equal 2.0. There is a subtle rind of metabolic activity within the RIGHT hemi thorax extending along the margin of the liver which is consistent with extensive pleural metastasis. Within the LEFT upper lobe there is a 6 mm nodule with mild associated metabolic activity. There additional pulmonary nodules within the LEFT upper lobe and LEFT lower lobe. There is mild metabolic activity associated the pleural surface adjacent to the costovertebral junction within the LEFT hemi thorax which could indicate a subtle pleural metastasis not detected by CT. 8 mm precarinal lymph node on image 53 has intense metabolic activity for size SUV max equal 7.0. Hypermetabolic subcarinal lymph node with SUV max equal 6.3 ABDOMEN/PELVIS No abnormal hypermetabolic activity within the liver, pancreas, adrenal glands, or spleen. No hypermetabolic lymph nodes in the abdomen or pelvis. SKELETON No focal hypermetabolic activity to suggest skeletal metastasis. IMPRESSION: 1. Bilateral hypermetabolic pulmonary nodules consistent with pulmonary metastasis, much greater on the RIGHT. 2. Extensive pleural metastasis within the RIGHT hemi thorax and suspicion for pleural involvement in  the LEFT hemi thorax. 3. Hypermetabolic mediastinal nodal metastasis to the pre carinal and subcarinal nodal stations. 4. No evidence of metastasis below the hemidiaphragms. 5. Diffuse thyroid hypermetabolic activity consistent with thyroiditis. Electronically Signed   By: Suzy Bouchard M.D.   On: 03/11/2015 09:38    ASSESSMENT: This is a very pleasant 81 years old never smoker white female with history of stage IA non-small cell lung cancer, adenocarcinoma with positive EGFR mutation with deletion in exon 19 diagnosed initially in December 2012 who presented with recurrent disease in the lung bilaterally more on the right side diagnosed in October 2016.   PLAN: I had a lengthy discussion with the patient and her friend today about her current disease condition and treatment options. I strongly recommended for the patient to have CT-guided core biopsy of one of the right pleural based nodules for confirmation of tissue diagnosis before proceeding with treatment. I will refer the patient to interventional radiology for consideration of the CT-guided core biopsy. The patient has a history of non-small cell lung cancer adenocarcinoma with positive EGFR mutation. Once the tissue diagnosis is confirmed, I may consider the patient for treatment with EGFR tyrosine kinase inhibitor. Because of her age and comorbidities, I may consider the patient for treatment with Iressa 250 mg by mouth daily as it has less toxicity compared to the other EGFR tyrosine kinase inhibitors especially with diarrhea and skin rash and the patient has also history of ulcerative colitis which may complicate her treatment if she had severe diarrhea. I will see the patient back for follow-up visit in 2 weeks for reevaluation and more detailed discussion of her treatments after the tissue diagnosis. The patient was advised to call immediately if she has any concerning symptoms in the interval.  The patient voices understanding of  current disease status and treatment options and is in agreement with the current care plan.  All questions were answered. The patient knows to call the clinic with any problems, questions or concerns. We can certainly see the patient much sooner if necessary.  Thank you so much for allowing me to participate in the care of Natalie Carlson. I will continue to follow up the patient with you and assist in her care.  I spent 40 minutes counseling the patient face to face. The total time  spent in the appointment was 60 minutes.  Disclaimer: This note was dictated with voice recognition software. Similar sounding words can inadvertently be transcribed and may not be corrected upon review.   Mignon Bechler K. March 22, 2015, 3:02 PM

## 2015-03-22 NOTE — Telephone Encounter (Signed)
Gave patient avs report and appointment for November. [facility] will call patient re bx - patient aware.

## 2015-03-25 ENCOUNTER — Other Ambulatory Visit: Payer: Self-pay | Admitting: Radiology

## 2015-03-29 ENCOUNTER — Encounter (HOSPITAL_COMMUNITY): Payer: Self-pay

## 2015-03-29 ENCOUNTER — Ambulatory Visit (HOSPITAL_COMMUNITY)
Admission: RE | Admit: 2015-03-29 | Discharge: 2015-03-29 | Disposition: A | Discharge status: Home / Self Care | Payer: Medicare Other | Source: Ambulatory Visit | Attending: Diagnostic Radiology | Admitting: Diagnostic Radiology

## 2015-03-29 ENCOUNTER — Ambulatory Visit (HOSPITAL_COMMUNITY)
Admission: RE | Admit: 2015-03-29 | Discharge: 2015-03-29 | Disposition: A | Discharge status: Home / Self Care | Payer: Medicare Other | Source: Ambulatory Visit | Attending: Internal Medicine | Admitting: Internal Medicine

## 2015-03-29 DIAGNOSIS — C782 Secondary malignant neoplasm of pleura: Secondary | ICD-10-CM | POA: Insufficient documentation

## 2015-03-29 DIAGNOSIS — C3431 Malignant neoplasm of lower lobe, right bronchus or lung: Secondary | ICD-10-CM | POA: Diagnosis not present

## 2015-03-29 DIAGNOSIS — C3490 Malignant neoplasm of unspecified part of unspecified bronchus or lung: Secondary | ICD-10-CM | POA: Insufficient documentation

## 2015-03-29 DIAGNOSIS — C3491 Malignant neoplasm of unspecified part of right bronchus or lung: Secondary | ICD-10-CM

## 2015-03-29 DIAGNOSIS — R918 Other nonspecific abnormal finding of lung field: Secondary | ICD-10-CM | POA: Diagnosis not present

## 2015-03-29 DIAGNOSIS — G43909 Migraine, unspecified, not intractable, without status migrainosus: Secondary | ICD-10-CM | POA: Insufficient documentation

## 2015-03-29 DIAGNOSIS — E039 Hypothyroidism, unspecified: Secondary | ICD-10-CM | POA: Diagnosis not present

## 2015-03-29 DIAGNOSIS — R911 Solitary pulmonary nodule: Secondary | ICD-10-CM | POA: Diagnosis not present

## 2015-03-29 DIAGNOSIS — Z9889 Other specified postprocedural states: Secondary | ICD-10-CM

## 2015-03-29 DIAGNOSIS — G8929 Other chronic pain: Secondary | ICD-10-CM | POA: Insufficient documentation

## 2015-03-29 DIAGNOSIS — M549 Dorsalgia, unspecified: Secondary | ICD-10-CM | POA: Diagnosis not present

## 2015-03-29 DIAGNOSIS — I4891 Unspecified atrial fibrillation: Secondary | ICD-10-CM | POA: Diagnosis not present

## 2015-03-29 DIAGNOSIS — E785 Hyperlipidemia, unspecified: Secondary | ICD-10-CM | POA: Diagnosis not present

## 2015-03-29 LAB — CBC
HEMATOCRIT: 41.9 % (ref 36.0–46.0)
HEMOGLOBIN: 13.8 g/dL (ref 12.0–15.0)
MCH: 30 pg (ref 26.0–34.0)
MCHC: 32.9 g/dL (ref 30.0–36.0)
MCV: 91.1 fL (ref 78.0–100.0)
Platelets: 239 10*3/uL (ref 150–400)
RBC: 4.6 MIL/uL (ref 3.87–5.11)
RDW: 13.5 % (ref 11.5–15.5)
WBC: 3.8 10*3/uL — ABNORMAL LOW (ref 4.0–10.5)

## 2015-03-29 LAB — APTT: APTT: 29 s (ref 24–37)

## 2015-03-29 LAB — PROTIME-INR
INR: 1.2 (ref 0.00–1.49)
Prothrombin Time: 15.4 seconds — ABNORMAL HIGH (ref 11.6–15.2)

## 2015-03-29 MED ORDER — ACETAMINOPHEN 500 MG PO TABS
500.0000 mg | ORAL_TABLET | ORAL | Status: DC | PRN
  Filled 2015-03-29: qty 1

## 2015-03-29 MED ORDER — FENTANYL CITRATE (PF) 100 MCG/2ML IJ SOLN
INTRAMUSCULAR | Status: AC | PRN
  Administered 2015-03-29: 50 ug via INTRAVENOUS
  Administered 2015-03-29: 25 ug via INTRAVENOUS

## 2015-03-29 MED ORDER — SODIUM CHLORIDE 0.9 % IV SOLN: Freq: Once | INTRAVENOUS | Status: DC

## 2015-03-29 MED ORDER — MIDAZOLAM HCL 2 MG/2ML IJ SOLN
INTRAMUSCULAR | Status: AC | PRN
  Administered 2015-03-29: 0.5 mg via INTRAVENOUS
  Administered 2015-03-29: 1 mg via INTRAVENOUS

## 2015-03-29 MED ORDER — LIDOCAINE HCL (PF) 1 % IJ SOLN
INTRAMUSCULAR | Status: AC
  Filled 2015-03-29: qty 30

## 2015-03-29 MED ORDER — FENTANYL CITRATE (PF) 100 MCG/2ML IJ SOLN
INTRAMUSCULAR | Status: AC
  Filled 2015-03-29: qty 2

## 2015-03-29 MED ORDER — GELATIN ABSORBABLE 12-7 MM EX MISC
CUTANEOUS | Status: AC
  Filled 2015-03-29: qty 1

## 2015-03-29 MED ORDER — MIDAZOLAM HCL 2 MG/2ML IJ SOLN
INTRAMUSCULAR | Status: AC
  Filled 2015-03-29: qty 2

## 2015-03-29 NOTE — Procedures (Signed)
Post-Procedure Note  Pre-operative Diagnosis: Lung cancer with new lung nodules       Post-operative Diagnosis: same   Indications: New lung nodules and needs tissue sampling  Procedure Details:   Informed consent obtained.  CT guided biopsy of right posterior pleural based nodule.  One adequate sample obtained and patient began coughing immediately following biopsy with small amount of bloody sputum.  No other samples were obtained.  Patient was hemodynamically stable with O2 sats over 96% without oxygen.  Occasional cough with bloody sputum.  Findings: Needle placed in right posterior pleural based nodule.  No significant pneumothorax.  Complications: Self-limiting mild hemoptysis     Condition: Stable  Plan: Watch patient in IR nursing station until coughing stops.  Will send back to Short Stay and get CXR in 1-2 hours.  Plan to discharge to home later today.

## 2015-03-29 NOTE — Sedation Documentation (Signed)
Patient will get chest x-ray before heading to Short Stay. Patient with stable vital signs. Coughing/hemoptysis has stopped. Will continue to monitor.  Roselyn Reef Alaiah Lundy,RN

## 2015-03-29 NOTE — Sedation Documentation (Signed)
Patient transported to X-ray for chest x-ray per orders. Roselyn Reef Tonee Silverstein,RN

## 2015-03-29 NOTE — Sedation Documentation (Signed)
Patient with mild hemoptysis. Patient taken to the radiology nurse's station for monitoring. Patient reports mild pain at a 5/10 in her back. Will continue to monitor. Roselyn Reef Jenene Kauffmann,RN

## 2015-03-29 NOTE — Sedation Documentation (Signed)
Patient transported to room 13 in short stay via stretcher accompanied by RN. Patient alert, oriented, no discomfort or distress observed or reported by patient.  Roselyn Reef Eriyanna Kofoed,RN

## 2015-03-29 NOTE — Discharge Instructions (Signed)

## 2015-03-29 NOTE — H&P (Signed)
Chief Complaint: Patient was seen in consultation today for Right lung mass biopsy at the request of [facility]  Referring Physician(s): Mohamed,Mohamed  History of Present Illness: Natalie Carlson is a 81 y.o. female   Pt with Hx Right lung cancer adencocarcinoma RUL lobectomy 2013 Now with new nodules noted on CT 02/2015 Symptoms of shortness of breath +PET 03/11/15: IMPRESSION: 1. Bilateral hypermetabolic pulmonary nodules consistent with pulmonary metastasis, much greater on the RIGHT. 2. Extensive pleural metastasis within the RIGHT hemi thorax and suspicion for pleural involvement in the LEFT hemi thorax. 3. Hypermetabolic mediastinal nodal metastasis to the pre carinal and subcarinal nodal stations.  Now scheduled for biopsy of Right lower lobe nodule per Dr Julien Nordmann request Last dose coumadin 5 days ago  Past Medical History  Diagnosis Date  . Hypothyroidism   . Cataract of both eyes   . Mucus in stool   . Chronic diarrhea   . Hyperlipidemia   . Colitis   . History of colon polyps   . History of migraines   . Arthritis   . Chronic back pain   . Hemorrhoids   . Macular degeneration, dry   . Adenocarcinoma of lung ([place])     Stage  1A  EGFR positive  ALK-negative   . Atrial fibrillation ([place])   . Nephrolithiasis     1994 - passed on its on    Past Surgical History  Procedure Laterality Date  . Cataract extraction  2010/2011  . Bladder tack  1996  . Colonoscopy  3 YRS AGO  . Thyroid surgery  1961  . Colonoscopy  03/06/2011    Procedure: COLONOSCOPY;  Surgeon: Rogene Houston, MD;  Location: [facility];  Service: Endoscopy;  Laterality: N/A;  7:30 am  . Tonsillectomy    . Esophagogastroduodenoscopy    . Cardioversion  11/02/09    x 2  . Video bronchoscopy  05/31/2011    Procedure: VIDEO BRONCHOSCOPY;  Surgeon: Grace Isaac, MD;  Location: [facility];  Service: Thoracic;  Laterality: N/A;  . Flexible sigmoidoscopy N/A 10/04/2012    Procedure:  FLEXIBLE SIGMOIDOSCOPY;  Surgeon: Rogene Houston, MD;  Location: [facility];  Service: Endoscopy;  Laterality: N/A;  11:00-moved to 1015 Ann to notify pt    Allergies: Doxycycline; Entocort ec; Neurontin; Vicodin; Adhesive; Amoxicillin; Chocolate; Codeine; Levaquin; Morphine and related; Naprosyn; Penicillins; Prednisone; Tramadol; and Uncoded nonscreenable allergen  Medications: Prior to Admission medications   Medication Sig Start Date End Date Taking? Authorizing Provider  acetaminophen (TYLENOL) 325 MG tablet Take 650 mg by mouth every 6 (six) hours as needed for moderate pain.    Yes Historical Provider, MD  APRISO 0.375 G 24 hr capsule TAKE 2 CAPSULE BY MOUTH TWICE DAILY 01/04/15  Yes Butch Penny, NP  Cholecalciferol 2000 UNITS CAPS Take 2,000 Units by mouth daily.    Yes Historical Provider, MD  DILTIAZEM CD 180 MG 24 hr capsule TAKE 1 CAPSULE BY MOUTH EVERY DAY (DOSE INCREASE) Patient taking differently: TAKE 180 MG BY MOUTH EVERY DAY 01/29/15  Yes Herminio Commons, MD  furosemide (LASIX) 20 MG tablet TAKE 1 TABLET BY MOUTH EVERY DAY Patient taking differently: TAKE 20 MG BY MOUTH EVERY DAY AS NEEDED FOR FLUID 03/04/15  Yes Satira Sark, MD  LUTEIN PO Take 1 capsule by mouth daily.    Yes Historical Provider, MD  Multiple Vitamins-Minerals (AIRBORNE) CHEW Chew 1 tablet by mouth daily.   Yes Historical Provider, MD  Multiple Vitamins-Minerals (HAIR/SKIN/NAILS) TABS Take 1 tablet by mouth daily.   Yes Historical Provider, MD  Omega-3 Fatty Acids (FISH OIL) 1200 MG CAPS Take 1,200 mg by mouth daily.   Yes Historical Provider, MD  Polyvinyl Alcohol-Povidone (REFRESH OP) Place 1 drop into both eyes as needed (for dry eyes).   Yes Historical Provider, MD  potassium chloride (K-DUR) 10 MEQ tablet Take 1 tablet (10 mEq total) by mouth daily. Patient taking differently: Take 10 mEq by mouth daily as needed (for when taking with Lasix).  01/14/14  Yes Satira Sark, MD  warfarin  (COUMADIN) 5 MG tablet TAKE 1 AND 1/2 TABLETS BY MOUTH DAILY EXCEPT 1 TABLET ON MONDAYS, [place]. Patient taking differently: TAKE 7.5 MG BY MOUTH DAILY EXCEPT 5 MG ON MONDAYS, WEDNESDAYS AND FRIDAYS. 03/01/15  Yes Satira Sark, MD  ALPRAZolam Duanne Moron) 0.5 MG tablet Take 0.5 mg by mouth every 8 (eight) hours as needed for anxiety.    Historical Provider, MD  diclofenac sodium (VOLTAREN) 1 % GEL Apply 1 application topically 2 (two) times daily as needed (for neck pain).     Historical Provider, MD     Family History  Problem Relation Age of Onset  . Stroke Mother   . Stroke Father   . Stroke Other     two siblings  . Heart disease Other     sibling  . Liver disease Paternal Grandmother   . Anesthesia problems Sister   . Hypotension Neg Hx   . Malignant hyperthermia Neg Hx   . Pseudochol deficiency Neg Hx     Social History   Social History  . Marital Status: Widowed    Spouse Name: N/A  . Number of Children: N/A  . Years of Education: N/A   Social History Main Topics  . Smoking status: Never Smoker   . Smokeless tobacco: Never Used  . Alcohol Use: No  . Drug Use: No  . Sexual Activity: No   Other Topics Concern  . None   Social History Narrative   Widow   Retired from [employer]: A 12 point ROS discussed and pertinent positives are indicated in the HPI above.  All other systems are negative.  Review of Systems  Constitutional: Negative for fever, activity change, appetite change and fatigue.  Respiratory: Positive for shortness of breath. Negative for cough and chest tightness.   Neurological: Negative for weakness.  Psychiatric/Behavioral: Negative for behavioral problems and confusion.    Vital Signs: BP 126/69 mmHg  Pulse 68  Temp(Src) 98.1 F (36.7 C)  Resp 20  Ht 5' 3"  (1.6 m)  Wt 120 lb (54.432 kg)  BMI 21.26 kg/m2  SpO2 100%  Physical Exam  Constitutional: She is oriented to person, place, and  time.  Cardiovascular:  No murmur heard. Irreg rate Afib  Pulmonary/Chest: Effort normal and breath sounds normal. She has no wheezes.  Abdominal: Soft. Bowel sounds are normal. There is no tenderness.  Musculoskeletal: Normal range of motion.  Neurological: She is alert and oriented to person, place, and time.  Skin: Skin is warm and dry.  Psychiatric: She has a normal mood and affect. Her behavior is normal. Judgment and thought content normal.  Nursing note and vitals reviewed.   Mallampati Score:  MD Evaluation Airway: WNL Heart: WNL Abdomen: WNL Chest/ Lungs: WNL ASA  Classification: 3 Mallampati/Airway Score: One  Imaging: Ct Chest Wo Contrast  03/03/2015  CLINICAL DATA:  History lung cancer. Surgery in 2013. Short of breath on exertion. EXAM: CT CHEST WITHOUT CONTRAST TECHNIQUE: Multidetector CT imaging of the chest was performed following the standard protocol without IV contrast. COMPARISON:  02/26/2014 and 08/28/2013. FINDINGS: Mediastinum/Nodes: Enlargement and heterogeneity of the thyroid. Similar. Aortic and branch vessel atherosclerosis. Tortuous thoracic aorta. Moderate cardiomegaly, without pericardial effusion. LAD and right coronary artery atherosclerosis. An 8 mm right paratracheal node on image/series 18/3 measured 6 mm on the prior. Hilar regions poorly evaluated without intravenous contrast. Lungs/Pleura: Trace right-sided pleural fluid is new. Progressive pleural based nodularity within the right hemi thorax. An example area along the right lower lobe pleural space posteriorly measures 1.5 x 0.9 cm on image/series 24/4. There is also progressive nodularity along the right minor fissure on image/series 19/4. Status post right upper lobectomy. Pulmonary parenchymal nodules within both lungs are new and progressive. Index right lower lobe 5 mm nodule on image/series 18/4 measured 3 mm on the prior. Right lower lobe nodules measure up to 8 mm on image/series 37/4. These  were on the order of 3-4 mm on the prior exam. Index left upper lobe pulmonary nodule measures 6 mm on image/series 23/4. This nodule measured only 2-3 mm on the prior exam. No lobar consolidation. Upper abdomen: Normal imaged portions of the liver, spleen, stomach, pancreas, adrenal glands. Musculoskeletal: Upper lumbar degenerative disc disease. IMPRESSION: 1. Progressive right pleural and bilateral pulmonary parenchymal nodularity, consistent with recurrent or progressive metastatic disease. 2. New trace right pleural fluid. 3. Enlargement of a non pathologically enlarged pretracheal node. Indeterminate. 4.  Atherosclerosis, including within the coronary arteries. Electronically Signed   By: Abigail Miyamoto M.D.   On: 03/03/2015 15:42   Mr Jeri Cos AS Contrast  03/10/2015  CLINICAL DATA:  Lung cancer.  Pulmonary nodules. EXAM: MRI HEAD WITHOUT AND WITH CONTRAST TECHNIQUE: Multiplanar, multiecho pulse sequences of the brain and surrounding structures were obtained without and with intravenous contrast. CONTRAST:  62m MULTIHANCE GADOBENATE DIMEGLUMINE 529 MG/ML IV SOLN COMPARISON:  MRI brain 05/29/2011 FINDINGS: A remote right temporal lobe infarct is again seen. Extensive periventricular and subcortical white matter disease is present bilaterally. Diffuse dilated perivascular spaces throughout the basal ganglia are stable. Remote infarcts in the thalami are again noted. No acute infarct, hemorrhage, or mass lesion is present. The ventricles are proportionate to the degree of atrophy. No significant extra-axial fluid collection is present. The skullbase is within normal limits. Midline structures are unremarkable. The postcontrast images demonstrate no pathologic enhancement. IMPRESSION: 1. No acute intracranial abnormality or significant interval change. 2. No evidence for metastatic disease to the brain. 3. Stable atrophy and diffuse white matter disease. This likely reflects the sequela of chronic microvascular  ischemia. 4. Remote lacunar infarcts within the thalami bilaterally. Electronically Signed   By: CSan MorelleM.D.   On: 03/10/2015 18:42   Nm Pet Image Initial (pi) Skull Base To Thigh  03/11/2015  CLINICAL DATA:  Initial treatment strategy for pulmonary nodule. History of lung cancer surgery in 2013. RIGHT upper lobectomy done 05/31/2011 for adenocarcinoma the right upper lobe pT1b, pN0 EGFR positive and ALK negative. EXAM: NUCLEAR MEDICINE PET SKULL BASE TO THIGH TECHNIQUE: 5.8 mCi F-18 FDG was injected intravenously. Full-ring PET imaging was performed from the skull base to thigh after the radiotracer. CT data was obtained and used for attenuation correction and anatomic localization. FASTING BLOOD GLUCOSE:  Value: 96 mg/dl COMPARISON:  CT 03/03/2015, CT 02/26/2014, PET-CT 05/04/2011 FINDINGS: NECK No  hypermetabolic lymph nodes in the neck. Diffuse hypermetabolic activity within thyroid gland again noted. CHEST No hypermetabolic mediastinal or hilar nodes. No suspicious pulmonary nodules on the CT scan. Multipulmonary nodules within RIGHT lower lobe within the parenchyma and along the pleural surfaces. Example nodule in the posterior RIGHT lower lobe measuring 13 mm along the pleural surface on image 62, series 4 has associated metabolic activity with SUV max equal 4.3. Cluster of nodules along the horizontal fissure on image 72 with SUV max 2.8. A parenchymal nodule in the RIGHT lower lobe measuring 8 mm in image 73 with SUV max equal 2.0. There is a subtle rind of metabolic activity within the RIGHT hemi thorax extending along the margin of the liver which is consistent with extensive pleural metastasis. Within the LEFT upper lobe there is a 6 mm nodule with mild associated metabolic activity. There additional pulmonary nodules within the LEFT upper lobe and LEFT lower lobe. There is mild metabolic activity associated the pleural surface adjacent to the costovertebral junction within the LEFT hemi  thorax which could indicate a subtle pleural metastasis not detected by CT. 8 mm precarinal lymph node on image 53 has intense metabolic activity for size SUV max equal 7.0. Hypermetabolic subcarinal lymph node with SUV max equal 6.3 ABDOMEN/PELVIS No abnormal hypermetabolic activity within the liver, pancreas, adrenal glands, or spleen. No hypermetabolic lymph nodes in the abdomen or pelvis. SKELETON No focal hypermetabolic activity to suggest skeletal metastasis. IMPRESSION: 1. Bilateral hypermetabolic pulmonary nodules consistent with pulmonary metastasis, much greater on the RIGHT. 2. Extensive pleural metastasis within the RIGHT hemi thorax and suspicion for pleural involvement in the LEFT hemi thorax. 3. Hypermetabolic mediastinal nodal metastasis to the pre carinal and subcarinal nodal stations. 4. No evidence of metastasis below the hemidiaphragms. 5. Diffuse thyroid hypermetabolic activity consistent with thyroiditis. Electronically Signed   By: Suzy Bouchard M.D.   On: 03/11/2015 09:38    Labs:  CBC:  Recent Labs  03/22/15 1340 03/29/15 1015  WBC 5.1 3.8*  HGB 14.4 13.8  HCT 43.2 41.9  PLT 251 239    COAGS:  Recent Labs  11/03/14 0813 12/15/14 0807 01/26/15 0804 03/09/15 0758  INR 3.0 2.3 2.2 2.5    BMP:  Recent Labs  03/10/15 1706 03/22/15 1339  NA  --  139  K  --  4.5  CO2  --  25  GLUCOSE  --  95  BUN  --  12.4  CALCIUM  --  9.6  CREATININE 0.70 0.7    LIVER FUNCTION TESTS:  Recent Labs  03/22/15 1339  BILITOT 0.44  AST 22  ALT 14  ALKPHOS 110  PROT 6.9  ALBUMIN 3.6    TUMOR MARKERS: No results for input(s): AFPTM, CEA, CA199, CHROMGRNA in the last 8760 hours.  Assessment and Plan:  Hx Lung Ca RUL lobectomy 2013 New RLL nodules on CT and +PET Now scheduled for Right lung mass bx Risks and Benefits discussed with the patient including, but not limited to bleeding, hemoptysis, respiratory failure requiring intubation, infection,  pneumothorax requiring chest tube placement, stroke from air embolism or even death. All of the patient's questions were answered, patient is agreeable to proceed. Consent signed and in chart.   Thank you for this interesting consult.  I greatly enjoyed meeting Natalie Carlson and look forward to participating in their care.  A copy of this report was sent to the requesting provider on this date.  Signed: Johndaniel Catlin A 03/29/2015, 10:29 AM  I spent a total of  30 Minutes   in face to face in clinical consultation, greater than 50% of which was counseling/coordinating care for Right lung mass biopsy

## 2015-04-07 ENCOUNTER — Encounter: Payer: Self-pay | Admitting: Internal Medicine

## 2015-04-07 ENCOUNTER — Ambulatory Visit (HOSPITAL_BASED_OUTPATIENT_CLINIC_OR_DEPARTMENT_OTHER): Payer: Medicare Other | Admitting: Internal Medicine

## 2015-04-07 ENCOUNTER — Telehealth: Payer: Self-pay | Admitting: Internal Medicine

## 2015-04-07 ENCOUNTER — Telehealth: Payer: Self-pay | Admitting: Pharmacist

## 2015-04-07 VITALS — BP 105/78 | HR 74 | Temp 97.7°F | Resp 18 | Ht 63.0 in | Wt 119.3 lb

## 2015-04-07 DIAGNOSIS — C3491 Malignant neoplasm of unspecified part of right bronchus or lung: Secondary | ICD-10-CM | POA: Diagnosis not present

## 2015-04-07 MED ORDER — GEFITINIB 250 MG PO TABS: 250.0000 mg | ORAL_TABLET | Freq: Every day | ORAL | Status: DC

## 2015-04-07 NOTE — Progress Notes (Signed)
Natalie [phone]   [phone]  OFFICE PROGRESS NOTE  Natalie Mccreedy, MD [address] [place] [address]  DIAGNOSIS: Natalie current non-small cell lung cancer presented with bilateral lung nodules in October 2016, initially diagnosed stage IA non-small cell lung cancer, adenocarcinoma with positive EGFR mutation with deletion in exon 94 diagnosed in December 2012  PRIOR THERAPY: Status post bronchoscopy with right VATS and right upper lobectomy with lymph node dissection under Natalie care of Dr. Servando Snare on 05/31/2011.  CURRENT THERAPY: Iressa 250 mg by mouth daily. First dose expected in Natalie next few days.  INTERVAL HISTORY: Natalie Carlson 81 y.o. female returns to Natalie clinic today for follow-up visit accompanied by her son. Natalie Carlson is feeling fine today with no specific complaints except for mild fatigue. She has no significant chest pain but has shortness breath with exertion with mild cough and no hemoptysis. She denied having any significant weight loss or night sweats. She has no nausea or vomiting. Natalie Carlson has a history of ulcerative colitis with occasional diarrhea. She denied having any significant fever or chills, no headache or visual changes. She recently underwent CT-guided core biopsy of one of Natalie right lung lesion and Natalie final pathology was consistent with adenocarcinoma. Natalie Carlson is here today for evaluation and discussion of her biopsy and treatment options.  MEDICAL HISTORY: Past Medical History  Diagnosis Date  . Hypothyroidism   . Cataract of both eyes   . Mucus in stool   . Chronic diarrhea   . Hyperlipidemia   . Colitis   . History of colon polyps   . History of migraines   . Arthritis   . Chronic back pain   . Hemorrhoids   . Macular degeneration, dry   . Adenocarcinoma of lung ([facility])     Stage  1A  EGFR positive  ALK-negative   . Atrial fibrillation ([facility])   . Nephrolithiasis     1994 - passed on  its on    ALLERGIES:  is allergic to doxycycline; entocort ec; neurontin; vicodin; adhesive; amoxicillin; chocolate; codeine; levaquin; morphine and related; naprosyn; penicillins; prednisone; tramadol; and uncoded nonscreenable allergen.  MEDICATIONS:  Current Outpatient Prescriptions  Medication Sig Dispense Refill  . acetaminophen (TYLENOL) 325 MG tablet Take 650 mg by mouth every 6 (six) hours as needed for moderate pain.     Marland Kitchen ALPRAZolam (XANAX) 0.5 MG tablet Take 0.5 mg by mouth every 8 (eight) hours as needed for anxiety.    . APRISO 0.375 G 24 hr capsule TAKE 2 CAPSULE BY MOUTH TWICE DAILY 120 capsule 6  . Cholecalciferol 2000 UNITS CAPS Take 2,000 Units by mouth daily.     . diclofenac sodium (VOLTAREN) 1 % GEL Apply 1 application topically 2 (two) times daily as needed (for neck pain).     . DILTIAZEM CD 180 MG 24 hr capsule TAKE 1 CAPSULE BY MOUTH EVERY DAY (DOSE INCREASE) (Carlson taking differently: TAKE 180 MG BY MOUTH EVERY DAY) 30 capsule 6  . furosemide (LASIX) 20 MG tablet TAKE 1 TABLET BY MOUTH EVERY DAY (Carlson taking differently: TAKE 20 MG BY MOUTH EVERY DAY AS NEEDED FOR FLUID) 30 tablet 5  . LUTEIN PO Take 1 capsule by mouth daily.     . Multiple Vitamins-Minerals (AIRBORNE) CHEW Chew 1 tablet by mouth daily.    . Multiple Vitamins-Minerals (HAIR/SKIN/NAILS) TABS Take 1 tablet by mouth daily.    . Omega-3 Fatty Acids (  FISH OIL) 1200 MG CAPS Take 1,200 mg by mouth daily.    . Polyvinyl Alcohol-Povidone (REFRESH OP) Place 1 drop into both eyes as needed (for dry eyes).    . potassium chloride (K-DUR) 10 MEQ tablet Take 1 tablet (10 mEq total) by mouth daily. (Carlson taking differently: Take 10 mEq by mouth daily as needed (for when taking with Lasix). ) 30 tablet 6  . warfarin (COUMADIN) 5 MG tablet TAKE 1 AND 1/2 TABLETS BY MOUTH DAILY EXCEPT 1 TABLET ON MONDAYS, WEDNESDAYS AND FRIDAYS. (Carlson taking differently: TAKE 7.5 MG BY MOUTH DAILY EXCEPT 5 MG ON MONDAYS,  WEDNESDAYS AND FRIDAYS.) 45 tablet 6   No current facility-administered medications for this visit.    SURGICAL HISTORY:  Past Surgical History  Procedure Laterality Date  . Cataract extraction  2010/2011  . Bladder tack  1996  . Colonoscopy  3 YRS AGO  . Thyroid surgery  1961  . Colonoscopy  03/06/2011    Procedure: COLONOSCOPY;  Surgeon: Rogene Houston, MD;  [facility];  Service: Endoscopy;  Laterality: N/A;  7:30 am  . Tonsillectomy    . Esophagogastroduodenoscopy    . Cardioversion  11/02/09    x 2  . Video bronchoscopy  05/31/2011    Procedure: VIDEO BRONCHOSCOPY;  Surgeon: Grace Isaac, MD;  Location: [facility];  Service: Thoracic;  Laterality: N/A;  . Flexible sigmoidoscopy N/A 10/04/2012    Procedure: FLEXIBLE SIGMOIDOSCOPY;  Surgeon: Rogene Houston, MD;  [facility]: [facility];  Service: Endoscopy;  Laterality: N/A;  11:00-moved to 1015 Ann to notify pt    REVIEW OF SYSTEMS:  Constitutional: positive for fatigue Eyes: negative Ears, nose, mouth, throat, and face: negative Respiratory: positive for cough and dyspnea on exertion Cardiovascular: negative Gastrointestinal: negative Genitourinary:negative Integument/breast: negative Hematologic/lymphatic: negative Musculoskeletal:negative Neurological: negative Behavioral/Psych: negative Endocrine: negative Allergic/Immunologic: negative   PHYSICAL EXAMINATION: General appearance: alert, cooperative, fatigued and no distress Head: Normocephalic, without obvious abnormality, atraumatic Neck: no adenopathy, no JVD, supple, symmetrical, trachea midline and thyroid not enlarged, symmetric, no tenderness/mass/nodules Lymph nodes: Cervical, supraclavicular, and axillary nodes normal. Resp: clear to auscultation bilaterally Back: symmetric, no curvature. ROM normal. No CVA tenderness. Cardio: regular rate and rhythm, S1, S2 normal, no murmur, click, rub or gallop GI: soft, non-tender; bowel sounds normal; no  masses,  no organomegaly Extremities: extremities normal, atraumatic, no cyanosis or edema Neurologic: Alert and oriented X 3, normal strength and tone. Normal symmetric reflexes. Normal coordination and gait  ECOG PERFORMANCE STATUS: 1 - Symptomatic but completely ambulatory  Blood pressure 105/78, pulse 74, temperature 97.7 F (36.5 C), temperature source Oral, resp. rate 18, height _0  (1.6 m), weight 119 lb 4.8 oz (54.114 kg), SpO2 100 %.  LABORATORY DATA: Lab Results  Component Value Date   WBC 3.8* 03/29/2015   HGB 13.8 03/29/2015   HCT 41.9 03/29/2015   MCV 91.1 03/29/2015   PLT 239 03/29/2015      Chemistry      Component Value Date/Time   NA 139 03/22/2015 1339   NA 141 01/07/2014 1636   K 4.5 03/22/2015 1339   K 4.2 01/07/2014 1636   CL 102 01/07/2014 1636   CO2 25 03/22/2015 1339   CO2 29 01/07/2014 1636   BUN 12.4 03/22/2015 1339   BUN 14 01/07/2014 1636   CREATININE 0.7 03/22/2015 1339   CREATININE 0.70 03/10/2015 1706   CREATININE 0.64 07/17/2012 0950      Component Value Date/Time  CALCIUM 9.6 03/22/2015 1339   CALCIUM 9.1 01/07/2014 1636   ALKPHOS 110 03/22/2015 1339   ALKPHOS 58 06/02/2011 0400   AST 22 03/22/2015 1339   AST 28 06/02/2011 0400   ALT 14 03/22/2015 1339   ALT 17 06/02/2011 0400   BILITOT 0.44 03/22/2015 1339   BILITOT 0.5 06/02/2011 0400       RADIOGRAPHIC STUDIES: Dg Chest 1 View  03/29/2015  CLINICAL DATA:  Status post right sided lung biopsy EXAM: CHEST 1 VIEW COMPARISON:  Earlier today FINDINGS: Heart size is normal. No pleural effusion identified. Numerous nodules are seen within both lungs, most numerous within Natalie right hemi thorax. No pneumothorax after right-sided biopsy identified. IMPRESSION: 1. No pneumothorax identified after biopsy. 2. Pulmonary nodules identified. Electronically Signed   By: Kerby Moors M.D.   On: 03/29/2015 14:28   Natalie Carlson XB Contrast  03/10/2015  CLINICAL DATA:  Lung cancer.  Pulmonary  nodules. EXAM: MRI HEAD WITHOUT AND WITH CONTRAST TECHNIQUE: Multiplanar, multiecho pulse sequences of Natalie brain and surrounding structures were obtained without and with intravenous contrast. CONTRAST:  55m MULTIHANCE GADOBENATE DIMEGLUMINE 529 MG/ML IV SOLN COMPARISON:  MRI brain 05/29/2011 FINDINGS: A remote right temporal lobe infarct is again seen. Extensive periventricular and subcortical white matter disease is present bilaterally. Diffuse dilated perivascular spaces throughout Natalie basal ganglia are stable. Remote infarcts in Natalie thalami are again noted. No acute infarct, hemorrhage, or mass lesion is present. Natalie ventricles are proportionate to Natalie degree of atrophy. No significant extra-axial fluid collection is present. Natalie skullbase is within normal limits. Midline structures are unremarkable. Natalie postcontrast images demonstrate no pathologic enhancement. IMPRESSION: 1. No acute intracranial abnormality or significant interval change. 2. No evidence for metastatic disease to Natalie brain. 3. Stable atrophy and diffuse white matter disease. This likely reflects Natalie sequela of chronic microvascular ischemia. 4. Remote lacunar infarcts within Natalie thalami bilaterally. Electronically Signed   By: CSan MorelleM.D.   On: 03/10/2015 18:42   Nm Pet Image Initial (pi) Skull Base To Thigh  03/11/2015  CLINICAL DATA:  Initial treatment strategy for pulmonary nodule. History of lung cancer surgery in 2013. RIGHT upper lobectomy done 05/31/2011 for adenocarcinoma Natalie right upper lobe pT1b, pN0 EGFR positive and ALK negative. EXAM: NUCLEAR MEDICINE PET SKULL BASE TO THIGH TECHNIQUE: 5.8 mCi F-18 FDG was injected intravenously. Full-ring PET imaging was performed from Natalie skull base to thigh after Natalie radiotracer. CT data was obtained and used for attenuation correction and anatomic localization. FASTING BLOOD GLUCOSE:  Value: 96 mg/dl COMPARISON:  CT 03/03/2015, CT 02/26/2014, PET-CT 05/04/2011 FINDINGS: NECK  No hypermetabolic lymph nodes in Natalie neck. Diffuse hypermetabolic activity within thyroid gland again noted. CHEST No hypermetabolic mediastinal or hilar nodes. No suspicious pulmonary nodules on Natalie CT scan. Multipulmonary nodules within RIGHT lower lobe within Natalie parenchyma and along Natalie pleural surfaces. Example nodule in Natalie posterior RIGHT lower lobe measuring 13 mm along Natalie pleural surface on image 62, series 4 has associated metabolic activity with SUV max equal 4.3. Cluster of nodules along Natalie horizontal fissure on image 72 with SUV max 2.8. A parenchymal nodule in Natalie RIGHT lower lobe measuring 8 mm in image 73 with SUV max equal 2.0. There is a subtle rind of metabolic activity within Natalie RIGHT hemi thorax extending along Natalie margin of Natalie liver which is consistent with extensive pleural metastasis. Within Natalie LEFT upper lobe there is a 6 mm nodule with mild associated metabolic activity. There additional  pulmonary nodules within Natalie LEFT upper lobe and LEFT lower lobe. There is mild metabolic activity associated Natalie pleural surface adjacent to Natalie costovertebral junction within Natalie LEFT hemi thorax which could indicate a subtle pleural metastasis not detected by CT. 8 mm precarinal lymph node on image 53 has intense metabolic activity for size SUV max equal 7.0. Hypermetabolic subcarinal lymph node with SUV max equal 6.3 ABDOMEN/PELVIS No abnormal hypermetabolic activity within Natalie liver, pancreas, adrenal glands, or spleen. No hypermetabolic lymph nodes in Natalie abdomen or pelvis. SKELETON No focal hypermetabolic activity to suggest skeletal metastasis. IMPRESSION: 1. Bilateral hypermetabolic pulmonary nodules consistent with pulmonary metastasis, much greater on Natalie RIGHT. 2. Extensive pleural metastasis within Natalie RIGHT hemi thorax and suspicion for pleural involvement in Natalie LEFT hemi thorax. 3. Hypermetabolic mediastinal nodal metastasis to Natalie pre carinal and subcarinal nodal stations. 4. No  evidence of metastasis below Natalie hemidiaphragms. 5. Diffuse thyroid hypermetabolic activity consistent with thyroiditis. Electronically Signed   By: Suzy Bouchard M.D.   On: 03/11/2015 09:38   Ct Biopsy  03/29/2015  CLINICAL DATA:  81 year old with history of right lung adenocarcinoma status post right upper lobectomy. Natalie Carlson now has multiple new pulmonary nodules and needs a tissue diagnosis. EXAM: CT GUIDED BIOPSY OF RIGHT LUNG NODULE Physician: Stephan Minister. Anselm Pancoast, MD MEDICATIONS: 1.5 mg versed, 75 mcg fentanyl. A radiology nurse monitored Natalie Carlson for moderate sedation. ANESTHESIA/SEDATION: Sedation time: 21 minutes PROCEDURE: Natalie procedure was explained to Natalie Carlson. Natalie risks and benefits of Natalie procedure were discussed and Natalie Carlson's questions were addressed. Risks such as pneumothorax, hemoptysis and even death were discussed with Natalie Carlson. Informed consent was obtained from Natalie Carlson. Natalie Carlson was placed prone. CT images of Natalie chest were obtained. Natalie pleural-based nodule in Natalie posterior right lower lobe was targeted. Natalie right side of Natalie back was prepped with Betadine and a sterile field was created. Skin and soft tissues were anesthetized with 1% lidocaine. 17 gauge needle directed into Natalie pleural-based lesion with CT guidance. A single 18 gauge core biopsy was obtained. Immediately following Natalie core biopsy, Natalie Carlson started to cough. Carlson had a small amount of blood tinged sputum. Due to Natalie coughing, Natalie procedure was stopped. 17 gauge needle was removed. Bandage was placed at Natalie puncture site. Natalie coughing with self-limiting and resolved within approximately 15 minutes. FINDINGS: Multiple pulmonary nodules. Pleural-based lesion along Natalie posterior right lower lobe measuring roughly 1.6 cm was targeted. Needle position was confirmed within Natalie lesion. No significant pneumothorax following Natalie core biopsy. Minimal parenchymal bleeding identified following Natalie core biopsy.  Carlson experienced minimal hemoptysis immediately following Natalie procedure which was self-limiting. COMPLICATIONS: Self-limiting minimal hemoptysis. SIR level A: No therapy, no consequence. IMPRESSION: CT-guided core biopsy of a right pleural-based nodule. Electronically Signed   By: Markus Daft M.D.   On: 03/29/2015 16:21    ASSESSMENT AND PLAN: This is a very pleasant 81 years old white female with recurrent non-small cell lung cancer, adenocarcinoma with positive EGFR mutation with deletion in exon 19 diagnosed in October 2016 and presented with bilateral pulmonary nodules. I had a lengthy discussion with Natalie Carlson and her son today about her current disease stage, prognosis and treatment options. Natalie Carlson has positive EGFR mutation and I recommended for her treatment with EGFR tyrosine kinase inhibitor. She has a history of ulcertive colitis and concern about diarrhea with her treatment. I recommended for her treatment with Iressa 250 mg by mouth daily. I discussed with  Natalie Carlson Natalie adverse effect of this treatment including but not limited to skin rash, diarrhea, interstitial lung disease, liver, renal dysfunction. She was advised to take Imodium for diarrhea and to call if she has any significant skin rash. She is expected to start Natalie first dose of this treatment in Natalie next few days. I will arrange for Natalie Carlson to come back for follow-up visit in 2 weeks for reevaluation and management of any adverse effect of her treatment. Natalie Carlson and her son agreed to Natalie current plan. She was advised to call immediately if she has any concerning symptoms in Natalie interval. Natalie Carlson voices understanding of current disease status and treatment options and is in agreement with Natalie current care plan.  All questions were answered. Natalie Carlson knows to call Natalie clinic with any problems, questions or concerns. We can certainly see Natalie Carlson much sooner if necessary.  I spent 20 minutes counseling  Natalie Carlson face to face. Natalie total time spent in Natalie appointment was 30 minutes.  Disclaimer: This note was dictated with voice recognition software. Similar sounding words can inadvertently be transcribed and may not be corrected upon review.

## 2015-04-07 NOTE — Telephone Encounter (Signed)
per pof to sch pt appt-gave pt copy of avs °

## 2015-04-07 NOTE — Telephone Encounter (Signed)
11/23 New Rx for Iressa sent to [facility] by Dr. Julien Nordmann. Prior Auth Required. Faxed to [employer] # [phone] for Prior Auth approval.

## 2015-04-13 ENCOUNTER — Telehealth: Payer: Self-pay | Admitting: Medical Oncology

## 2015-04-13 NOTE — Telephone Encounter (Signed)
"  [place] [employer] [facility] appeals called reporting Iressa has been approved for 12 months effective today."

## 2015-04-13 NOTE — Telephone Encounter (Signed)
Request a call to [employer] ( vivian[facility] to complete authorization. Call [phone], ref [place] [id number]

## 2015-04-16 ENCOUNTER — Encounter: Payer: Self-pay | Admitting: Pharmacist

## 2015-04-16 NOTE — Progress Notes (Signed)
12/2 - Pt picked up sample 30 day supply of Iressa she will start tomorrow 12/3. Counseled on administration, dosing, safe handling, and side effects (diarrhea, fatigue and rash). Pt voiced understanding

## 2015-04-20 ENCOUNTER — Ambulatory Visit (INDEPENDENT_AMBULATORY_CARE_PROVIDER_SITE_OTHER): Payer: Medicare Other | Admitting: *Deleted

## 2015-04-20 DIAGNOSIS — Z7901 Long term (current) use of anticoagulants: Secondary | ICD-10-CM

## 2015-04-20 DIAGNOSIS — Z5181 Encounter for therapeutic drug level monitoring: Secondary | ICD-10-CM

## 2015-04-20 DIAGNOSIS — I4891 Unspecified atrial fibrillation: Secondary | ICD-10-CM

## 2015-04-20 LAB — POCT INR: INR: 2.9

## 2015-04-21 ENCOUNTER — Telehealth: Payer: Self-pay | Admitting: Internal Medicine

## 2015-04-21 ENCOUNTER — Encounter: Payer: Self-pay | Admitting: Internal Medicine

## 2015-04-21 ENCOUNTER — Ambulatory Visit (HOSPITAL_BASED_OUTPATIENT_CLINIC_OR_DEPARTMENT_OTHER): Payer: Medicare Other | Admitting: Internal Medicine

## 2015-04-21 ENCOUNTER — Other Ambulatory Visit (HOSPITAL_BASED_OUTPATIENT_CLINIC_OR_DEPARTMENT_OTHER): Payer: Medicare Other

## 2015-04-21 VITALS — BP 138/66 | HR 79 | Temp 97.7°F | Resp 18 | Ht 63.0 in | Wt 121.1 lb

## 2015-04-21 DIAGNOSIS — C3411 Malignant neoplasm of upper lobe, right bronchus or lung: Secondary | ICD-10-CM | POA: Diagnosis not present

## 2015-04-21 DIAGNOSIS — C3491 Malignant neoplasm of unspecified part of right bronchus or lung: Secondary | ICD-10-CM | POA: Diagnosis present

## 2015-04-21 LAB — CBC WITH DIFFERENTIAL/PLATELET
BASO%: 0.8 % (ref 0.0–2.0)
BASOS ABS: 0 10*3/uL (ref 0.0–0.1)
EOS%: 1.9 % (ref 0.0–7.0)
Eosinophils Absolute: 0.1 10*3/uL (ref 0.0–0.5)
HCT: 43.4 % (ref 34.8–46.6)
HGB: 14.3 g/dL (ref 11.6–15.9)
LYMPH%: 25.7 % (ref 14.0–49.7)
MCH: 30.4 pg (ref 25.1–34.0)
MCHC: 32.9 g/dL (ref 31.5–36.0)
MCV: 92.1 fL (ref 79.5–101.0)
MONO#: 0.7 10*3/uL (ref 0.1–0.9)
MONO%: 15.3 % — ABNORMAL HIGH (ref 0.0–14.0)
NEUT#: 2.7 10*3/uL (ref 1.5–6.5)
NEUT%: 56.3 % (ref 38.4–76.8)
PLATELETS: 277 10*3/uL (ref 145–400)
RBC: 4.71 10*6/uL (ref 3.70–5.45)
RDW: 13.5 % (ref 11.2–14.5)
WBC: 4.8 10*3/uL (ref 3.9–10.3)
lymph#: 1.2 10*3/uL (ref 0.9–3.3)

## 2015-04-21 LAB — COMPREHENSIVE METABOLIC PANEL
ALK PHOS: 109 U/L (ref 40–150)
ALT: 12 U/L (ref 0–55)
ANION GAP: 11 meq/L (ref 3–11)
AST: 21 U/L (ref 5–34)
Albumin: 3.7 g/dL (ref 3.5–5.0)
BUN: 12 mg/dL (ref 7.0–26.0)
CALCIUM: 9.6 mg/dL (ref 8.4–10.4)
CHLORIDE: 105 meq/L (ref 98–109)
CO2: 25 meq/L (ref 22–29)
CREATININE: 0.7 mg/dL (ref 0.6–1.1)
EGFR: 75 mL/min/{1.73_m2} — ABNORMAL LOW (ref >90)
Glucose: 94 mg/dl (ref 70–140)
POTASSIUM: 4.6 meq/L (ref 3.5–5.1)
Sodium: 141 mEq/L (ref 136–145)
Total Bilirubin: 0.61 mg/dL (ref 0.20–1.20)
Total Protein: 7.4 g/dL (ref 6.4–8.3)

## 2015-04-21 NOTE — Telephone Encounter (Signed)
per pof to sch pt appt-gave pt copy of avs °

## 2015-04-21 NOTE — Progress Notes (Signed)
[facility] Telephone:([phone]) [phone]   [phone]) [phone]  OFFICE PROGRESS NOTE  [name], MD [address] [place] [address]  DIAGNOSIS: The current non-small cell lung cancer presented with bilateral lung nodules in October 2016, initially diagnosed stage IA non-small cell lung cancer, adenocarcinoma with positive EGFR mutation with deletion in exon 55 diagnosed in December 2012  PRIOR THERAPY: Status post bronchoscopy with right VATS and right upper lobectomy with lymph node dissection under the care of Dr. Servando Snare on 05/31/2011.  CURRENT THERAPY: Iressa 250 mg by mouth daily. First dose started 04/17/2015.  INTERVAL HISTORY: Natalie Carlson 81 y.o. female returns to the clinic today for follow-up visit accompanied by her friend. The patient is feeling fine today with no specific complaints except for mild fatigue. She started her treatment with Iressa on 04/17/2015 and has been tolerating it fairly well except for mild burning sensation in her eyes. The patient denied having any significant skin rash or diarrhea. She has no chest pain, shortness breath, cough or hemoptysis. The patient denied having any nausea or vomiting. She has no fever or chills. She is here today for evaluation with repeat blood work.  MEDICAL HISTORY: Past Medical History  Diagnosis Date  . Hypothyroidism   . Cataract of both eyes   . Mucus in stool   . Chronic diarrhea   . Hyperlipidemia   . Colitis   . History of colon polyps   . History of migraines   . Arthritis   . Chronic back pain   . Hemorrhoids   . Macular degeneration, dry   . Adenocarcinoma of lung ([facility])     Stage  1A  EGFR positive  ALK-negative   . Atrial fibrillation ([facility])   . Nephrolithiasis     1994 - passed on its on    ALLERGIES:  is allergic to doxycycline; entocort ec; neurontin; vicodin; adhesive; amoxicillin; chocolate; codeine; levaquin; morphine and related; naprosyn; penicillins; prednisone;  tramadol; and uncoded nonscreenable allergen.  MEDICATIONS:  Current Outpatient Prescriptions  Medication Sig Dispense Refill  . acetaminophen (TYLENOL) 325 MG tablet Take 650 mg by mouth every 6 (six) hours as needed for moderate pain.     Marland Kitchen ALPRAZolam (XANAX) 0.5 MG tablet Take 0.5 mg by mouth every 8 (eight) hours as needed for anxiety.    . APRISO 0.375 G 24 hr capsule TAKE 2 CAPSULE BY MOUTH TWICE DAILY 120 capsule 6  . Cholecalciferol 2000 UNITS CAPS Take 2,000 Units by mouth daily.     . diclofenac sodium (VOLTAREN) 1 % GEL Apply 1 application topically 2 (two) times daily as needed (for neck pain).     . DILTIAZEM CD 180 MG 24 hr capsule TAKE 1 CAPSULE BY MOUTH EVERY DAY (DOSE INCREASE) (Patient taking differently: TAKE 180 MG BY MOUTH EVERY DAY) 30 capsule 6  . furosemide (LASIX) 20 MG tablet TAKE 1 TABLET BY MOUTH EVERY DAY (Patient taking differently: TAKE 20 MG BY MOUTH EVERY DAY AS NEEDED FOR FLUID) 30 tablet 5  . gefitinib (IRESSA) 250 MG tablet Take 1 tablet (250 mg total) by mouth daily. 30 tablet 2  . LUTEIN PO Take 1 capsule by mouth daily.     . Multiple Vitamins-Minerals (AIRBORNE) CHEW Chew 1 tablet by mouth daily.    . Multiple Vitamins-Minerals (HAIR/SKIN/NAILS) TABS Take 1 tablet by mouth daily.    . Omega-3 Fatty Acids (FISH OIL) 1200 MG CAPS Take 1,200 mg by mouth daily.    Marland Kitchen  Polyvinyl Alcohol-Povidone (REFRESH OP) Place 1 drop into both eyes as needed (for dry eyes).    . potassium chloride (K-DUR) 10 MEQ tablet Take 1 tablet (10 mEq total) by mouth daily. (Patient taking differently: Take 10 mEq by mouth daily as needed (for when taking with Lasix). ) 30 tablet 6  . warfarin (COUMADIN) 5 MG tablet TAKE 1 AND 1/2 TABLETS BY MOUTH DAILY EXCEPT 1 TABLET ON MONDAYS, WEDNESDAYS AND FRIDAYS. (Patient taking differently: TAKE 7.5 MG BY MOUTH DAILY EXCEPT 5 MG ON MONDAYS, WEDNESDAYS AND FRIDAYS.) 45 tablet 6   No current facility-administered medications for this visit.     SURGICAL HISTORY:  Past Surgical History  Procedure Laterality Date  . Cataract extraction  2010/2011  . Bladder tack  1996  . Colonoscopy  3 YRS AGO  . Thyroid surgery  1961  . Colonoscopy  03/06/2011    Procedure: COLONOSCOPY;  Surgeon: Rogene Houston, MD;  [facility];  Service: Endoscopy;  Laterality: N/A;  7:30 am  . Tonsillectomy    . Esophagogastroduodenoscopy    . Cardioversion  11/02/09    x 2  . Video bronchoscopy  05/31/2011    Procedure: VIDEO BRONCHOSCOPY;  Surgeon: Grace Isaac, MD;  Location: [facility];  Service: Thoracic;  Laterality: N/A;  . Flexible sigmoidoscopy N/A 10/04/2012    Procedure: FLEXIBLE SIGMOIDOSCOPY;  Surgeon: Rogene Houston, MD;  [facility];  Service: Endoscopy;  Laterality: N/A;  11:00-moved to 1015 Ann to notify pt    REVIEW OF SYSTEMS:  A comprehensive review of systems was negative except for: Eyes: positive for irritation   PHYSICAL EXAMINATION: General appearance: alert, cooperative, fatigued and no distress Head: Normocephalic, without obvious abnormality, atraumatic Neck: no adenopathy, no JVD, supple, symmetrical, trachea midline and thyroid not enlarged, symmetric, no tenderness/mass/nodules Lymph nodes: Cervical, supraclavicular, and axillary nodes normal. Resp: clear to auscultation bilaterally Back: symmetric, no curvature. ROM normal. No CVA tenderness. Cardio: regular rate and rhythm, S1, S2 normal, no murmur, click, rub or gallop GI: soft, non-tender; bowel sounds normal; no masses,  no organomegaly Extremities: extremities normal, atraumatic, no cyanosis or edema Neurologic: Alert and oriented X 3, normal strength and tone. Normal symmetric reflexes. Normal coordination and gait  ECOG PERFORMANCE STATUS: 1 - Symptomatic but completely ambulatory  Blood pressure 138/66, pulse 79, temperature 97.7 F (36.5 C), temperature source Oral, resp. rate 18, height 5' 3" (1.6 m), weight 121 lb 1.6 oz (54.931  kg), SpO2 100 %.  LABORATORY DATA: Lab Results  Component Value Date   WBC 4.8 04/21/2015   HGB 14.3 04/21/2015   HCT 43.4 04/21/2015   MCV 92.1 04/21/2015   PLT 277 04/21/2015      Chemistry      Component Value Date/Time   NA 139 03/22/2015 1339   NA 141 01/07/2014 1636   K 4.5 03/22/2015 1339   K 4.2 01/07/2014 1636   CL 102 01/07/2014 1636   CO2 25 03/22/2015 1339   CO2 29 01/07/2014 1636   BUN 12.4 03/22/2015 1339   BUN 14 01/07/2014 1636   CREATININE 0.7 03/22/2015 1339   CREATININE 0.70 03/10/2015 1706   CREATININE 0.64 07/17/2012 0950      Component Value Date/Time   CALCIUM 9.6 03/22/2015 1339   CALCIUM 9.1 01/07/2014 1636   ALKPHOS 110 03/22/2015 1339   ALKPHOS 58 06/02/2011 0400   AST 22 03/22/2015 1339   AST 28 06/02/2011 0400   ALT 14 03/22/2015 1339  ALT 17 06/02/2011 0400   BILITOT 0.44 03/22/2015 1339   BILITOT 0.5 06/02/2011 0400       RADIOGRAPHIC STUDIES: Dg Chest 1 View  03/29/2015  CLINICAL DATA:  Status post right sided lung biopsy EXAM: CHEST 1 VIEW COMPARISON:  Earlier today FINDINGS: Heart size is normal. No pleural effusion identified. Numerous nodules are seen within both lungs, most numerous within the right hemi thorax. No pneumothorax after right-sided biopsy identified. IMPRESSION: 1. No pneumothorax identified after biopsy. 2. Pulmonary nodules identified. Electronically Signed   By: Kerby Moors M.D.   On: 03/29/2015 14:28   Ct Biopsy  03/29/2015  CLINICAL DATA:  81 year old with history of right lung adenocarcinoma status post right upper lobectomy. The patient now has multiple new pulmonary nodules and needs a tissue diagnosis. EXAM: CT GUIDED BIOPSY OF RIGHT LUNG NODULE Physician: Stephan Minister. Anselm Pancoast, MD MEDICATIONS: 1.5 mg versed, 75 mcg fentanyl. A radiology nurse monitored the patient for moderate sedation. ANESTHESIA/SEDATION: Sedation time: 21 minutes PROCEDURE: The procedure was explained to the patient. The risks and benefits of  the procedure were discussed and the patient's questions were addressed. Risks such as pneumothorax, hemoptysis and even death were discussed with the patient. Informed consent was obtained from the patient. The patient was placed prone. CT images of the chest were obtained. The pleural-based nodule in the posterior right lower lobe was targeted. The right side of the back was prepped with Betadine and a sterile field was created. Skin and soft tissues were anesthetized with 1% lidocaine. 17 gauge needle directed into the pleural-based lesion with CT guidance. A single 18 gauge core biopsy was obtained. Immediately following the core biopsy, the patient started to cough. Patient had a small amount of blood tinged sputum. Due to the coughing, the procedure was stopped. 17 gauge needle was removed. Bandage was placed at the puncture site. The coughing with self-limiting and resolved within approximately 15 minutes. FINDINGS: Multiple pulmonary nodules. Pleural-based lesion along the posterior right lower lobe measuring roughly 1.6 cm was targeted. Needle position was confirmed within the lesion. No significant pneumothorax following the core biopsy. Minimal parenchymal bleeding identified following the core biopsy. Patient experienced minimal hemoptysis immediately following the procedure which was self-limiting. COMPLICATIONS: Self-limiting minimal hemoptysis. SIR level A: No therapy, no consequence. IMPRESSION: CT-guided core biopsy of a right pleural-based nodule. Electronically Signed   By: Markus Daft M.D.   On: 03/29/2015 16:21    ASSESSMENT AND PLAN: This is a very pleasant 81 years old white female with recurrent non-small cell lung cancer, adenocarcinoma with positive EGFR mutation with deletion in exon 38 diagnosed in October 2016 and presented with bilateral pulmonary nodules. The patient is currently on Iressa 250 mg by mouth daily and has been tolerating her treatment well except for irritation of the  right. I recommended for her to use over-the-counter isotonic eyedrops. I will arrange for the patient to come back for follow-up visit in 2 weeks for reevaluation and management of any adverse effect of her treatment. She was advised to call immediately if she has any concerning symptoms in the interval. The patient voices understanding of current disease status and treatment options and is in agreement with the current care plan.  All questions were answered. The patient knows to call the clinic with any problems, questions or concerns. We can certainly see the patient much sooner if necessary.  Disclaimer: This note was dictated with voice recognition software. Similar sounding words can inadvertently be transcribed and may  not be corrected upon review.

## 2015-05-03 ENCOUNTER — Other Ambulatory Visit: Payer: Self-pay | Admitting: Cardiology

## 2015-05-04 ENCOUNTER — Ambulatory Visit (INDEPENDENT_AMBULATORY_CARE_PROVIDER_SITE_OTHER): Payer: Medicare Other | Admitting: *Deleted

## 2015-05-04 DIAGNOSIS — Z5181 Encounter for therapeutic drug level monitoring: Secondary | ICD-10-CM | POA: Diagnosis not present

## 2015-05-04 DIAGNOSIS — I4891 Unspecified atrial fibrillation: Secondary | ICD-10-CM

## 2015-05-04 DIAGNOSIS — Z7901 Long term (current) use of anticoagulants: Secondary | ICD-10-CM | POA: Diagnosis not present

## 2015-05-04 LAB — POCT INR: INR: 2.5

## 2015-05-05 ENCOUNTER — Telehealth: Payer: Self-pay | Admitting: Internal Medicine

## 2015-05-05 ENCOUNTER — Ambulatory Visit (HOSPITAL_BASED_OUTPATIENT_CLINIC_OR_DEPARTMENT_OTHER): Payer: Medicare Other | Admitting: Physician Assistant

## 2015-05-05 ENCOUNTER — Other Ambulatory Visit (HOSPITAL_BASED_OUTPATIENT_CLINIC_OR_DEPARTMENT_OTHER): Payer: Medicare Other

## 2015-05-05 ENCOUNTER — Other Ambulatory Visit: Payer: Self-pay | Admitting: Physician Assistant

## 2015-05-05 VITALS — BP 136/68 | HR 86 | Temp 98.4°F | Resp 20 | Ht 63.0 in | Wt 121.0 lb

## 2015-05-05 DIAGNOSIS — Z5181 Encounter for therapeutic drug level monitoring: Secondary | ICD-10-CM

## 2015-05-05 DIAGNOSIS — C3411 Malignant neoplasm of upper lobe, right bronchus or lung: Secondary | ICD-10-CM

## 2015-05-05 DIAGNOSIS — L270 Generalized skin eruption due to drugs and medicaments taken internally: Secondary | ICD-10-CM | POA: Insufficient documentation

## 2015-05-05 LAB — COMPREHENSIVE METABOLIC PANEL
ALBUMIN: 3.6 g/dL (ref 3.5–5.0)
ALK PHOS: 114 U/L (ref 40–150)
ALT: 12 U/L (ref 0–55)
ANION GAP: 8 meq/L (ref 3–11)
AST: 21 U/L (ref 5–34)
BILIRUBIN TOTAL: 0.57 mg/dL (ref 0.20–1.20)
BUN: 13.8 mg/dL (ref 7.0–26.0)
CALCIUM: 9.4 mg/dL (ref 8.4–10.4)
CO2: 26 mEq/L (ref 22–29)
Chloride: 105 mEq/L (ref 98–109)
Creatinine: 0.7 mg/dL (ref 0.6–1.1)
EGFR: 76 mL/min/{1.73_m2} — AB (ref >90)
Glucose: 91 mg/dl (ref 70–140)
POTASSIUM: 4.2 meq/L (ref 3.5–5.1)
Sodium: 139 mEq/L (ref 136–145)
Total Protein: 7.4 g/dL (ref 6.4–8.3)

## 2015-05-05 LAB — CBC WITH DIFFERENTIAL/PLATELET
BASO%: 1 % (ref 0.0–2.0)
BASOS ABS: 0 10*3/uL (ref 0.0–0.1)
EOS ABS: 0.1 10*3/uL (ref 0.0–0.5)
EOS%: 2.4 % (ref 0.0–7.0)
HEMATOCRIT: 43.9 % (ref 34.8–46.6)
HEMOGLOBIN: 14 g/dL (ref 11.6–15.9)
LYMPH#: 1 10*3/uL (ref 0.9–3.3)
LYMPH%: 20.2 % (ref 14.0–49.7)
MCH: 28.9 pg (ref 25.1–34.0)
MCHC: 31.8 g/dL (ref 31.5–36.0)
MCV: 90.8 fL (ref 79.5–101.0)
MONO#: 0.8 10*3/uL (ref 0.1–0.9)
MONO%: 17.1 % — AB (ref 0.0–14.0)
NEUT#: 2.8 10*3/uL (ref 1.5–6.5)
NEUT%: 59.3 % (ref 38.4–76.8)
PLATELETS: 266 10*3/uL (ref 145–400)
RBC: 4.84 10*6/uL (ref 3.70–5.45)
RDW: 13.1 % (ref 11.2–14.5)
WBC: 4.7 10*3/uL (ref 3.9–10.3)

## 2015-05-05 MED ORDER — CLINDAMYCIN PHOSPHATE 1 % EX GEL: Freq: Two times a day (BID) | CUTANEOUS | Status: DC

## 2015-05-05 MED ORDER — ONDANSETRON HCL 4 MG PO TABS: 4.0000 mg | ORAL_TABLET | Freq: Three times a day (TID) | ORAL | Status: DC | PRN

## 2015-05-05 NOTE — Progress Notes (Signed)
[facility] [phone]   [phone]) [phone]  OFFICE PROGRESS NOTE  [name], MD [address] [place] [address]  DIAGNOSIS: The current non-small cell lung cancer presented with bilateral lung nodules in October 2016, initially diagnosed stage IA non-small cell lung cancer, adenocarcinoma with positive EGFR mutation with deletion in exon 67 diagnosed in December 2012  PRIOR THERAPY: Status post bronchoscopy with right VATS and right upper lobectomy with lymph node dissection under the care of Dr. Servando Snare on 05/31/2011.  CURRENT THERAPY: Iressa 250 mg by mouth daily. First dose started 04/17/2015.  INTERVAL HISTORY: Natalie Carlson 81 y.o. female returns to the clinic today for follow-up visit accompanied by her friend. The patient is feeling fine today with no specific complaints except for mild fatigue. She started her treatment with Iressa on 04/17/2015 and has been tolerating it fairly well except for except for drug related rash in her face, trunk and upper extremities She has intermittent nausea.. The patient denied having any diarrhea. She has no chest pain, shortness breath, cough or hemoptysis. The patient denied having any nausea or vomiting. She has no fever or chills. She is here today for evaluation with repeat blood work.  MEDICAL HISTORY: Past Medical History  Diagnosis Date  . Hypothyroidism   . Cataract of both eyes   . Mucus in stool   . Chronic diarrhea   . Hyperlipidemia   . Colitis   . History of colon polyps   . History of migraines   . Arthritis   . Chronic back pain   . Hemorrhoids   . Macular degeneration, dry   . Adenocarcinoma of lung ([facility])     Stage  1A  EGFR positive  ALK-negative   . Atrial fibrillation ([facility])   . Nephrolithiasis     1994 - passed on its on  . Malignant neoplasm of right upper lobe of lung ([facility])      stage  1A  EGFR positive  Deletion in exon 19.  ALK-negative  Right Upper Lobe Lung Mass   SUV 2.9 on PET Status post VATS and right upper lobe lobectomy 05/31/2011    ALLERGIES:  is allergic to doxycycline; entocort ec; neurontin; vicodin; adhesive; amoxicillin; chocolate; codeine; levaquin; morphine and related; naprosyn; penicillins; prednisone; tramadol; and uncoded nonscreenable allergen.  MEDICATIONS:  Current Outpatient Prescriptions  Medication Sig Dispense Refill  . acetaminophen (TYLENOL) 325 MG tablet Take 650 mg by mouth every 6 (six) hours as needed for moderate pain.     Marland Kitchen ALPRAZolam (XANAX) 0.5 MG tablet Take 0.5 mg by mouth every 8 (eight) hours as needed for anxiety.    . APRISO 0.375 G 24 hr capsule TAKE 2 CAPSULE BY MOUTH TWICE DAILY 120 capsule 6  . Cholecalciferol 2000 UNITS CAPS Take 2,000 Units by mouth daily.     . diclofenac sodium (VOLTAREN) 1 % GEL Apply 1 application topically 2 (two) times daily as needed (for neck pain).     . DILTIAZEM CD 180 MG 24 hr capsule TAKE 1 CAPSULE BY MOUTH EVERY DAY (DOSE INCREASE) (Patient taking differently: TAKE 180 MG BY MOUTH EVERY DAY) 30 capsule 6  . furosemide (LASIX) 20 MG tablet TAKE 1 TABLET BY MOUTH EVERY DAY (Patient taking differently: TAKE 20 MG BY MOUTH EVERY DAY AS NEEDED FOR FLUID) 30 tablet 5  . gefitinib (IRESSA) 250 MG tablet Take 1 tablet (250 mg total) by mouth daily. 30 tablet 2  . LUTEIN  PO Take 1 capsule by mouth daily.     . Multiple Vitamins-Minerals (AIRBORNE) CHEW Chew 1 tablet by mouth daily.    . Multiple Vitamins-Minerals (HAIR/SKIN/NAILS) TABS Take 1 tablet by mouth daily.    . Omega-3 Fatty Acids (FISH OIL) 1200 MG CAPS Take 1,200 mg by mouth daily.    . Polyvinyl Alcohol-Povidone (REFRESH OP) Place 1 drop into both eyes as needed (for dry eyes).    . potassium chloride (K-DUR,KLOR-CON) 10 MEQ tablet TAKE 1 TABLET BY MOUTH EVERY DAY 30 tablet 6  . warfarin (COUMADIN) 5 MG tablet TAKE 1 AND 1/2 TABLETS BY MOUTH DAILY EXCEPT 1 TABLET ON MONDAYS, WEDNESDAYS AND FRIDAYS. (Patient taking  differently: TAKE 7.5 MG BY MOUTH DAILY EXCEPT 5 MG ON MONDAYS, WEDNESDAYS AND FRIDAYS.) 45 tablet 6   No current facility-administered medications for this visit.    SURGICAL HISTORY:  Past Surgical History  Procedure Laterality Date  . Cataract extraction  2010/2011  . Bladder tack  1996  . Colonoscopy  3 YRS AGO  . Thyroid surgery  1961  . Colonoscopy  03/06/2011    Procedure: COLONOSCOPY;  Surgeon: Rogene Houston, MD;  [facility];  Service: Endoscopy;  Laterality: N/A;  7:30 am  . Tonsillectomy    . Esophagogastroduodenoscopy    . Cardioversion  11/02/09    x 2  . Video bronchoscopy  05/31/2011    Procedure: VIDEO BRONCHOSCOPY;  Surgeon: Grace Isaac, MD;  Location: [facility];  Service: Thoracic;  Laterality: N/A;  . Flexible sigmoidoscopy N/A 10/04/2012    Procedure: FLEXIBLE SIGMOIDOSCOPY;  Surgeon: Rogene Houston, MD;  [facility];  Service: Endoscopy;  Laterality: N/A;  11:00-moved to 1015 Ann to notify pt    REVIEW OF SYSTEMS:  A comprehensive review of systems was negative except for: Eyes: positive for irritation   PHYSICAL EXAMINATION: General appearance: alert, cooperative, fatigued and no distress Head: Normocephalic, without obvious abnormality, atraumatic Neck: no adenopathy, no JVD, supple, symmetrical, trachea midline and thyroid not enlarged, symmetric, no tenderness/mass/nodules Lymph nodes: Cervical, supraclavicular, and axillary nodes normal. Resp: clear to auscultation bilaterally Back: symmetric, no curvature. ROM normal. No CVA tenderness. Cardio: regular rate and rhythm, S1, S2 normal, no murmur, click, rub or gallop GI: soft, non-tender; bowel sounds normal; no masses,  no organomegaly Extremities: extremities normal, atraumatic, no cyanosis or edema Neurologic: Alert and oriented X 3, normal strength and tone. Normal symmetric reflexes. Normal coordination and gait  ECOG PERFORMANCE STATUS: 1 - Symptomatic but completely  ambulatory  Blood pressure 136/68, pulse 86, temperature 98.4 F (36.9 C), temperature source Oral, resp. rate 20, height 5' 3" (1.6 m), weight 121 lb (54.885 kg), SpO2 96 %.  LABORATORY DATA: CBC Latest Ref Rng 05/05/2015 04/21/2015 03/29/2015  WBC 3.9 - 10.3 10e3/uL 4.7 4.8 3.8(L)  Hemoglobin 11.6 - 15.9 g/dL 14.0 14.3 13.8  Hematocrit 34.8 - 46.6 % 43.9 43.4 41.9  Platelets 145 - 400 10e3/uL 266 277 239   CMP Latest Ref Rng 04/21/2015 03/22/2015 03/10/2015  Glucose 70 - 140 mg/dl 94 95 -  BUN 7.0 - 26.0 mg/dL 12.0 12.4 -  Creatinine 0.6 - 1.1 mg/dL 0.7 0.7 0.70  Sodium 136 - 145 mEq/L 141 139 -  Potassium 3.5 - 5.1 mEq/L 4.6 4.5 -  Chloride 96 - 112 mEq/L - - -  CO2 22 - 29 mEq/L 25 25 -  Calcium 8.4 - 10.4 mg/dL 9.6 9.6 -  Total Protein 6.4 - 8.3 g/dL  7.4 6.9 -  Total Bilirubin 0.20 - 1.20 mg/dL 0.61 0.44 -  Alkaline Phos 40 - 150 U/L 109 110 -  AST 5 - 34 U/L 21 22 -  ALT 0 - 55 U/L 12 14 -     RADIOGRAPHIC STUDIES: No results found.  ASSESSMENT AND PLAN: This is a very pleasant 81 years old white female with recurrent non-small cell lung cancer, adenocarcinoma with positive EGFR mutation with deletion in exon 41 diagnosed in October 2016 and presented with bilateral pulmonary nodules. The patient is currently on Iressa 250 mg by mouth daily and has been tolerating her treatment well except for drug related rash in her face, trunk and upper extremities, for which Clindagel bid was prescribed. For her nausea, Zofran 4 m q 6 hrs prn was prescribed.   She will return for follow-up visit in 4 weeks for reevaluation and management of any adverse effect of her treatment. She was advised to call immediately if she has any concerning symptoms in the interval. The patient voices understanding of current disease status and treatment options and is in agreement with the current care plan.  All questions were answered. The patient knows to call the clinic with any problems, questions or  concerns. We can certainly see the patient much sooner if necessary.  ADDENDUM: Hematology/Oncology Attending: I had a face to face encounter with the patient today. I recommended her care plan. This is a very pleasant 81 years old white female recently diagnosed with recurrent non-small cell lung cancer, adenocarcinoma with positive EGFR mutation in exon 19. The patient is currently undergoing treatment with Iressa 250 mg by mouth daily and tolerated the first 3 weeks of her treatment fairly well with no significant adverse effects except for skin rash on the face. She has no significant diarrhea except for few episode yesterday which completely resolved. I recommended for the patient to continue her current treatment with Iressa with the same dose. For the skin rash, will start the patient on clindamycin 1% gel to be applied to the skin rash areas. She would come back for follow-up visit in one month's for reevaluation and repeat CBC and comprehensive metabolic panel. The patient was advised to call immediately if she has any concerning symptoms in the interval.  Disclaimer: This note was dictated with voice recognition software. Similar sounding words can inadvertently be transcribed and may be missed upon review. Eilleen Kempf., MD 05/05/2015

## 2015-05-05 NOTE — Telephone Encounter (Signed)
per pof to sch pt appt-gave pt copy of avs °

## 2015-05-15 ENCOUNTER — Emergency Department (HOSPITAL_COMMUNITY): Payer: Medicare Other

## 2015-05-15 ENCOUNTER — Encounter (HOSPITAL_COMMUNITY): Payer: Self-pay | Admitting: Emergency Medicine

## 2015-05-15 ENCOUNTER — Emergency Department (HOSPITAL_COMMUNITY)
Admission: EM | Admit: 2015-05-15 | Discharge: 2015-05-15 | Disposition: A | Discharge status: Home / Self Care | Payer: Medicare Other | Attending: Emergency Medicine | Admitting: Emergency Medicine

## 2015-05-15 DIAGNOSIS — Z880 Allergy status to penicillin: Secondary | ICD-10-CM | POA: Insufficient documentation

## 2015-05-15 DIAGNOSIS — N200 Calculus of kidney: Secondary | ICD-10-CM | POA: Diagnosis not present

## 2015-05-15 DIAGNOSIS — R109 Unspecified abdominal pain: Secondary | ICD-10-CM | POA: Diagnosis present

## 2015-05-15 DIAGNOSIS — Z79899 Other long term (current) drug therapy: Secondary | ICD-10-CM | POA: Insufficient documentation

## 2015-05-15 DIAGNOSIS — Z7901 Long term (current) use of anticoagulants: Secondary | ICD-10-CM | POA: Insufficient documentation

## 2015-05-15 DIAGNOSIS — Z792 Long term (current) use of antibiotics: Secondary | ICD-10-CM | POA: Diagnosis not present

## 2015-05-15 DIAGNOSIS — Z85118 Personal history of other malignant neoplasm of bronchus and lung: Secondary | ICD-10-CM | POA: Diagnosis not present

## 2015-05-15 DIAGNOSIS — G8929 Other chronic pain: Secondary | ICD-10-CM | POA: Insufficient documentation

## 2015-05-15 DIAGNOSIS — R1032 Left lower quadrant pain: Secondary | ICD-10-CM | POA: Diagnosis not present

## 2015-05-15 DIAGNOSIS — I4891 Unspecified atrial fibrillation: Secondary | ICD-10-CM | POA: Insufficient documentation

## 2015-05-15 DIAGNOSIS — M1990 Unspecified osteoarthritis, unspecified site: Secondary | ICD-10-CM | POA: Insufficient documentation

## 2015-05-15 DIAGNOSIS — E039 Hypothyroidism, unspecified: Secondary | ICD-10-CM | POA: Insufficient documentation

## 2015-05-15 DIAGNOSIS — Z9889 Other specified postprocedural states: Secondary | ICD-10-CM | POA: Insufficient documentation

## 2015-05-15 DIAGNOSIS — Z86010 Personal history of colonic polyps: Secondary | ICD-10-CM | POA: Insufficient documentation

## 2015-05-15 DIAGNOSIS — N210 Calculus in bladder: Secondary | ICD-10-CM | POA: Diagnosis not present

## 2015-05-15 DIAGNOSIS — Z8719 Personal history of other diseases of the digestive system: Secondary | ICD-10-CM | POA: Diagnosis not present

## 2015-05-15 LAB — URINALYSIS, ROUTINE W REFLEX MICROSCOPIC
BILIRUBIN URINE: NEGATIVE
GLUCOSE, UA: NEGATIVE mg/dL
Ketones, ur: NEGATIVE mg/dL
Leukocytes, UA: NEGATIVE
Nitrite: NEGATIVE
PROTEIN: NEGATIVE mg/dL
Specific Gravity, Urine: 1.01 (ref 1.005–1.030)
pH: 7 (ref 5.0–8.0)

## 2015-05-15 LAB — URINE MICROSCOPIC-ADD ON

## 2015-05-15 NOTE — ED Notes (Signed)
Pt states that she was awoken by left flank pain around 5 am this morning.  States that the pain went away and returned one other time for a few minutes and is gone again.

## 2015-05-15 NOTE — ED Provider Notes (Signed)
CSN: [id number]     Arrival date & time 05/15/15  0973 History   First MD Initiated Contact with Patient 05/15/15 0710     Chief Complaint  Patient presents with  . Flank Pain     (Consider location/radiation/quality/duration/timing/severity/associated sxs/prior Treatment) Patient is a 82 y.o. female presenting with flank pain. The history is provided by the patient.  Flank Pain Pertinent negatives include no abdominal pain and no shortness of breath.   patient woke with severe left flank/lower back pain. Began around 5:00 she was nauseous with it felt as if she had a bowel movement but went and urinated. Pain later resolved on its own. She went back to bed and at around 6 AM she began to have pain again. Did have a remote history of kidney stones. No fevers. No chills. She is on oral chemotherapy for lung cancer. No diarrhea or constipation. She states she was nauseous with the pain.  Past Medical History  Diagnosis Date  . Hypothyroidism   . Cataract of both eyes   . Mucus in stool   . Chronic diarrhea   . Hyperlipidemia   . Colitis   . History of colon polyps   . History of migraines   . Arthritis   . Chronic back pain   . Hemorrhoids   . Macular degeneration, dry   . Adenocarcinoma of lung ([place])     Stage  1A  EGFR positive  ALK-negative   . Atrial fibrillation ([place])   . Nephrolithiasis     1994 - passed on its on  . Malignant neoplasm of right upper lobe of lung (HCC)      stage  1A  EGFR positive  Deletion in exon 19.  ALK-negative  Right Upper Lobe Lung Mass  SUV 2.9 on PET Status post VATS and right upper lobe lobectomy 05/31/2011   Past Surgical History  Procedure Laterality Date  . Cataract extraction  2010/2011  . Bladder tack  1996  . Colonoscopy  3 YRS AGO  . Thyroid surgery  1961  . Colonoscopy  03/06/2011    Procedure: COLONOSCOPY;  Surgeon: Rogene Houston, MD;  [facility];  Service: Endoscopy;  Laterality: N/A;  7:30 am  . Tonsillectomy     . Esophagogastroduodenoscopy    . Cardioversion  11/02/09    x 2  . Video bronchoscopy  05/31/2011    Procedure: VIDEO BRONCHOSCOPY;  Surgeon: Grace Isaac, MD;  Location: [facility];  Service: Thoracic;  Laterality: N/A;  . Flexible sigmoidoscopy N/A 10/04/2012    Procedure: FLEXIBLE SIGMOIDOSCOPY;  Surgeon: Rogene Houston, MD;  [facility];  Service: Endoscopy;  Laterality: N/A;  11:00-moved to 1015 Ann to notify pt   Family History  Problem Relation Age of Onset  . Stroke Mother   . Stroke Father   . Stroke Other     two siblings  . Heart disease Other     sibling  . Liver disease Paternal Grandmother   . Anesthesia problems Sister   . Hypotension Neg Hx   . Malignant hyperthermia Neg Hx   . Pseudochol deficiency Neg Hx    Social History  Substance Use Topics  . Smoking status: Never Smoker   . Smokeless tobacco: Never Used  . Alcohol Use: No   OB History    No data available     Review of Systems  Constitutional: Negative for chills and fatigue.  Respiratory: Negative for shortness of breath.  Gastrointestinal: Positive for nausea. Negative for vomiting and abdominal pain.  Genitourinary: Positive for flank pain. Negative for hematuria.  Skin: Negative for rash.  Neurological: Negative for numbness.      Allergies  Doxycycline; Entocort ec; Neurontin; Vicodin; Adhesive; Amoxicillin; Chocolate; Codeine; Levaquin; Morphine and related; Naprosyn; Penicillins; Prednisone; Tramadol; and Uncoded nonscreenable allergen  Home Medications   Prior to Admission medications   Medication Sig Start Date End Date Taking? Authorizing Provider  acetaminophen (TYLENOL) 325 MG tablet Take 650 mg by mouth every 6 (six) hours as needed for moderate pain.     Historical Provider, MD  ALPRAZolam Duanne Moron) 0.5 MG tablet Take 0.5 mg by mouth every 8 (eight) hours as needed for anxiety.    Historical Provider, MD  APRISO 0.375 G 24 hr capsule TAKE 2 CAPSULE BY MOUTH TWICE  DAILY 01/04/15   Butch Penny, NP  Cholecalciferol 2000 UNITS CAPS Take 2,000 Units by mouth daily.     Historical Provider, MD  clindamycin (CLINDAGEL) 1 % gel Apply topically 2 (two) times daily. 05/05/15   Rondel Jumbo, PA-C  diclofenac sodium (VOLTAREN) 1 % GEL Apply 1 application topically 2 (two) times daily as needed (for neck pain).     Historical Provider, MD  DILTIAZEM CD 180 MG 24 hr capsule TAKE 1 CAPSULE BY MOUTH EVERY DAY (DOSE INCREASE) Patient taking differently: TAKE 180 MG BY MOUTH EVERY DAY 01/29/15   Herminio Commons, MD  furosemide (LASIX) 20 MG tablet TAKE 1 TABLET BY MOUTH EVERY DAY Patient taking differently: TAKE 20 MG BY MOUTH EVERY DAY AS NEEDED FOR FLUID 03/04/15   Satira Sark, MD  gefitinib (IRESSA) 250 MG tablet Take 1 tablet (250 mg total) by mouth daily. 04/07/15   Curt Bears, MD  LUTEIN PO Take 1 capsule by mouth daily.     Historical Provider, MD  Multiple Vitamins-Minerals (AIRBORNE) CHEW Chew 1 tablet by mouth daily.    Historical Provider, MD  Multiple Vitamins-Minerals (HAIR/SKIN/NAILS) TABS Take 1 tablet by mouth daily.    Historical Provider, MD  Omega-3 Fatty Acids (FISH OIL) 1200 MG CAPS Take 1,200 mg by mouth daily.    Historical Provider, MD  ondansetron (ZOFRAN) 4 MG tablet Take 1 tablet (4 mg total) by mouth every 8 (eight) hours as needed for nausea or vomiting. 05/05/15   Rondel Jumbo, PA-C  Polyvinyl Alcohol-Povidone (REFRESH OP) Place 1 drop into both eyes as needed (for dry eyes).    Historical Provider, MD  potassium chloride (K-DUR,KLOR-CON) 10 MEQ tablet TAKE 1 TABLET BY MOUTH EVERY DAY 05/04/15   Satira Sark, MD  warfarin (COUMADIN) 5 MG tablet TAKE 1 AND 1/2 TABLETS BY MOUTH DAILY EXCEPT 1 TABLET ON MONDAYS, [place]. Patient taking differently: TAKE 7.5 MG BY MOUTH DAILY EXCEPT 5 MG ON MONDAYS, WEDNESDAYS AND FRIDAYS. 03/01/15   Satira Sark, MD   BP 141/76 mmHg  Pulse 89  Temp(Src) 97.5 F (36.4  C) (Oral)  Resp 18  Ht 5' 3"  (1.6 m)  Wt 120 lb (54.432 kg)  BMI 21.26 kg/m2  SpO2 98% Physical Exam  Constitutional: She appears well-developed.  HENT:  Head: Atraumatic.  Eyes: EOM are normal.  Cardiovascular: Normal rate and regular rhythm.   Pulmonary/Chest: Effort normal.  Abdominal: Soft.  Mild left lower quadrant tenderness without rebound or guarding.  Genitourinary:  No CVA tenderness  Musculoskeletal: Normal range of motion.  Neurological: She is alert.  Skin: Skin is warm.  ED Course  Procedures (including critical care time) Labs Review Labs Reviewed  URINALYSIS, ROUTINE W REFLEX MICROSCOPIC (NOT AT [facility] [place]) - Abnormal; Notable for the following:    APPearance CLOUDY (*)    Hgb urine dipstick LARGE (*)    All other components within normal limits  URINE MICROSCOPIC-ADD ON - Abnormal; Notable for the following:    Squamous Epithelial / LPF 0-5 (*)    Bacteria, UA FEW (*)    All other components within normal limits    Imaging Review Ct Renal Stone Study  05/15/2015  CLINICAL DATA:  Acute left flank pain. EXAM: CT ABDOMEN AND PELVIS WITHOUT CONTRAST TECHNIQUE: Multidetector CT imaging of the abdomen and pelvis was performed following the standard protocol without IV contrast. COMPARISON:  PET scan of March 11, 2015. FINDINGS: Multilevel degenerative disc disease is noted in the upper lumbar spine. Minimal right pleural effusion is noted with associated pleural-based nodules consistent with metastatic disease. No gallstones are noted. No focal abnormality is noted in the liver, spleen or pancreas on these unenhanced images. Adrenal glands appear normal. Small bilateral nonobstructive renal calculi are noted. No hydronephrosis or renal obstruction is noted. Atherosclerosis of abdominal aorta is noted without aneurysm formation. 3 mm calculus is noted in the dependent portion of the urinary bladder suggesting recently passed stone. There is no evidence of bowel  obstruction. No abnormal fluid collection is noted. Multiple calcified degenerating fibroids are noted in the uterus. Ovaries are unremarkable. Urinary bladder appears normal. No significant adenopathy is noted. IMPRESSION: Minimal right pleural effusion is noted with associated pleural-based nodules consistent with metastatic disease. Atherosclerosis of abdominal aorta without aneurysm formation. Small bilateral nonobstructive renal calculi are noted. No significant hydronephrosis or renal obstruction is noted. 3 mm calculus seen in dependent portion of urinary bladder suggesting recently passed stone. Multiple calcified degenerating fibroids seen in the uterus. Electronically Signed   By: Marijo Conception, M.D.   On: 05/15/2015 08:10   I have personally reviewed and evaluated these images and lab results as part of my medical decision-making.   EKG Interpretation None      MDM   Final diagnoses:  Left flank pain  Kidney stone    Patient with left flank pain. Pain improved. Apparently had passed a ureteral stone. Rebound and bladder. Will discharge home.    Davonna Belling, MD 05/15/15 [phone]

## 2015-05-15 NOTE — Discharge Instructions (Signed)
Kidney Stones °Kidney stones (urolithiasis) are deposits that form inside your kidneys. The intense pain is caused by the stone moving through the urinary tract. When the stone moves, the ureter goes into spasm around the stone. The stone is usually passed in the urine.  °CAUSES  °· A disorder that makes certain neck glands produce too much parathyroid hormone (primary hyperparathyroidism). °· A buildup of uric acid crystals, similar to gout in your joints. °· Narrowing (stricture) of the ureter. °· A kidney obstruction present at birth (congenital obstruction). °· Previous surgery on the kidney or ureters. °· Numerous kidney infections. °SYMPTOMS  °· Feeling sick to your stomach (nauseous). °· Throwing up (vomiting). °· Blood in the urine (hematuria). °· Pain that usually spreads (radiates) to the groin. °· Frequency or urgency of urination. °DIAGNOSIS  °· Taking a history and physical exam. °· Blood or urine tests. °· CT scan. °· Occasionally, an examination of the inside of the urinary bladder (cystoscopy) is performed. °TREATMENT  °· Observation. °· Increasing your fluid intake. °· Extracorporeal shock wave lithotripsy--This is a noninvasive procedure that uses shock waves to break up kidney stones. °· Surgery may be needed if you have severe pain or persistent obstruction. There are various surgical procedures. Most of the procedures are performed with the use of small instruments. Only small incisions are needed to accommodate these instruments, so recovery time is minimized. °The size, location, and chemical composition are all important variables that will determine the proper choice of action for you. Talk to your health care provider to better understand your situation so that you will minimize the risk of injury to yourself and your kidney.  °HOME CARE INSTRUCTIONS  °· Drink enough water and fluids to keep your urine clear or pale yellow. This will help you to pass the stone or stone fragments. °· Strain  all urine through the provided strainer. Keep all particulate matter and stones for your health care provider to see. The stone causing the pain may be as small as a grain of salt. It is very important to use the strainer each and every time you pass your urine. The collection of your stone will allow your health care provider to analyze it and verify that a stone has actually passed. The stone analysis will often identify what you can do to reduce the incidence of recurrences. °· Only take over-the-counter or prescription medicines for pain, discomfort, or fever as directed by your health care provider. °· Keep all follow-up visits as told by your health care provider. This is important. °· Get follow-up X-rays if required. The absence of pain does not always mean that the stone has passed. It may have only stopped moving. If the urine remains completely obstructed, it can cause loss of kidney function or even complete destruction of the kidney. It is your responsibility to make sure X-rays and follow-ups are completed. Ultrasounds of the kidney can show blockages and the status of the kidney. Ultrasounds are not associated with any radiation and can be performed easily in a matter of minutes. °· Make changes to your daily diet as told by your health care provider. You may be told to: °¨ Limit the amount of salt that you eat. °¨ Eat 5 or more servings of fruits and vegetables each day. °¨ Limit the amount of meat, poultry, fish, and eggs that you eat. °· Collect a 24-hour urine sample as told by your health care provider. You may need to collect another urine sample every 6-12   months. °SEEK MEDICAL CARE IF: °· You experience pain that is progressive and unresponsive to any pain medicine you have been prescribed. °SEEK IMMEDIATE MEDICAL CARE IF:  °· Pain cannot be controlled with the prescribed medicine. °· You have a fever or shaking chills. °· The severity or intensity of pain increases over 18 hours and is not  relieved by pain medicine. °· You develop a new onset of abdominal pain. °· You feel faint or pass out. °· You are unable to urinate. °  °This information is not intended to replace advice given to you by your health care provider. Make sure you discuss any questions you have with your health care provider. °  °Document Released: 05/01/2005 Document Revised: 01/20/2015 Document Reviewed: 10/02/2012 °Elsevier Interactive Patient Education ©2016 [employer] ° °

## 2015-05-27 ENCOUNTER — Ambulatory Visit (INDEPENDENT_AMBULATORY_CARE_PROVIDER_SITE_OTHER): Payer: Medicare Other | Admitting: *Deleted

## 2015-05-27 DIAGNOSIS — Z5181 Encounter for therapeutic drug level monitoring: Secondary | ICD-10-CM | POA: Diagnosis not present

## 2015-05-27 DIAGNOSIS — Z7901 Long term (current) use of anticoagulants: Secondary | ICD-10-CM | POA: Diagnosis not present

## 2015-05-27 DIAGNOSIS — I4891 Unspecified atrial fibrillation: Secondary | ICD-10-CM | POA: Diagnosis not present

## 2015-05-27 LAB — POCT INR: INR: 2.1

## 2015-06-01 ENCOUNTER — Other Ambulatory Visit: Payer: Self-pay | Admitting: Medical Oncology

## 2015-06-01 DIAGNOSIS — C3411 Malignant neoplasm of upper lobe, right bronchus or lung: Secondary | ICD-10-CM

## 2015-06-02 ENCOUNTER — Encounter: Payer: Self-pay | Admitting: Internal Medicine

## 2015-06-02 ENCOUNTER — Ambulatory Visit (HOSPITAL_BASED_OUTPATIENT_CLINIC_OR_DEPARTMENT_OTHER): Payer: Medicare Other | Admitting: Internal Medicine

## 2015-06-02 ENCOUNTER — Other Ambulatory Visit (HOSPITAL_BASED_OUTPATIENT_CLINIC_OR_DEPARTMENT_OTHER): Payer: Medicare Other

## 2015-06-02 ENCOUNTER — Telehealth: Payer: Self-pay | Admitting: Internal Medicine

## 2015-06-02 VITALS — BP 133/65 | HR 84 | Temp 98.9°F | Resp 18 | Ht 63.0 in | Wt 119.2 lb

## 2015-06-02 DIAGNOSIS — R21 Rash and other nonspecific skin eruption: Secondary | ICD-10-CM | POA: Diagnosis not present

## 2015-06-02 DIAGNOSIS — C3411 Malignant neoplasm of upper lobe, right bronchus or lung: Secondary | ICD-10-CM

## 2015-06-02 LAB — CBC WITH DIFFERENTIAL/PLATELET
BASO%: 1.1 % (ref 0.0–2.0)
Basophils Absolute: 0.1 10*3/uL (ref 0.0–0.1)
EOS ABS: 0.2 10*3/uL (ref 0.0–0.5)
EOS%: 4.3 % (ref 0.0–7.0)
HEMATOCRIT: 44.4 % (ref 34.8–46.6)
HEMOGLOBIN: 14.5 g/dL (ref 11.6–15.9)
LYMPH#: 0.9 10*3/uL (ref 0.9–3.3)
LYMPH%: 19.3 % (ref 14.0–49.7)
MCH: 29.3 pg (ref 25.1–34.0)
MCHC: 32.7 g/dL (ref 31.5–36.0)
MCV: 89.6 fL (ref 79.5–101.0)
MONO#: 0.8 10*3/uL (ref 0.1–0.9)
MONO%: 16.6 % — ABNORMAL HIGH (ref 0.0–14.0)
NEUT%: 58.7 % (ref 38.4–76.8)
NEUTROS ABS: 2.7 10*3/uL (ref 1.5–6.5)
PLATELETS: 306 10*3/uL (ref 145–400)
RBC: 4.96 10*6/uL (ref 3.70–5.45)
RDW: 13.7 % (ref 11.2–14.5)
WBC: 4.6 10*3/uL (ref 3.9–10.3)

## 2015-06-02 LAB — COMPREHENSIVE METABOLIC PANEL
ALBUMIN: 3.4 g/dL — AB (ref 3.5–5.0)
ALK PHOS: 121 U/L (ref 40–150)
ALT: 14 U/L (ref 0–55)
ANION GAP: 8 meq/L (ref 3–11)
AST: 21 U/L (ref 5–34)
BILIRUBIN TOTAL: 0.57 mg/dL (ref 0.20–1.20)
BUN: 12.4 mg/dL (ref 7.0–26.0)
CALCIUM: 9.2 mg/dL (ref 8.4–10.4)
CO2: 28 meq/L (ref 22–29)
CREATININE: 0.8 mg/dL (ref 0.6–1.1)
Chloride: 104 mEq/L (ref 98–109)
EGFR: 73 mL/min/{1.73_m2} — ABNORMAL LOW (ref >90)
Glucose: 82 mg/dl (ref 70–140)
Potassium: 4.4 mEq/L (ref 3.5–5.1)
Sodium: 140 mEq/L (ref 136–145)
TOTAL PROTEIN: 7.1 g/dL (ref 6.4–8.3)

## 2015-06-02 NOTE — Progress Notes (Signed)
[facility] Telephone:([phone]) [phone]   [phone]  OFFICE PROGRESS NOTE  [name], MD [address] [place] [address]  DIAGNOSIS: The current non-small cell lung cancer presented with bilateral lung nodules in October 2016, initially diagnosed stage IA non-small cell lung cancer, adenocarcinoma with positive EGFR mutation with deletion in exon 87 diagnosed in December 2012  PRIOR THERAPY: Status post bronchoscopy with right VATS and right upper lobectomy with lymph node dissection under the care of Dr. Servando Snare on 05/31/2011.  CURRENT THERAPY: Iressa 250 mg by mouth daily. First dose started 04/17/2015.  INTERVAL HISTORY: Natalie Carlson 82 y.o. female returns to the clinic today for follow-up visit accompanied by her friend. The patient is feeling fine today with no specific complaints except for mild skin rash on the face and chest. She is tolerating her treatment with Iressa fairly well. The patient denied having any significant diarrhea. She has no chest pain, shortness breath, cough or hemoptysis. The patient denied having any nausea or vomiting. She has no fever or chills. She is here today for evaluation with repeat blood work.  MEDICAL HISTORY: Past Medical History  Diagnosis Date  . Hypothyroidism   . Cataract of both eyes   . Mucus in stool   . Chronic diarrhea   . Hyperlipidemia   . Colitis   . History of colon polyps   . History of migraines   . Arthritis   . Chronic back pain   . Hemorrhoids   . Macular degeneration, dry   . Adenocarcinoma of lung ([place])     Stage  1A  EGFR positive  ALK-negative   . Atrial fibrillation ([place])   . Nephrolithiasis     1994 - passed on its on  . Malignant neoplasm of right upper lobe of lung (HCC)      stage  1A  EGFR positive  Deletion in exon 19.  ALK-negative  Right Upper Lobe Lung Mass  SUV 2.9 on PET Status post VATS and right upper lobe lobectomy 05/31/2011    ALLERGIES:  is allergic to  doxycycline; entocort ec; neurontin; vicodin; adhesive; amoxicillin; chocolate; codeine; levaquin; morphine and related; naprosyn; penicillins; prednisone; tramadol; and uncoded nonscreenable allergen.  MEDICATIONS:  Current Outpatient Prescriptions  Medication Sig Dispense Refill  . acetaminophen (TYLENOL) 325 MG tablet Take 650 mg by mouth every 6 (six) hours as needed for moderate pain.     Marland Kitchen ALPRAZolam (XANAX) 0.5 MG tablet Take 0.5 mg by mouth every 8 (eight) hours as needed for anxiety.    . APRISO 0.375 G 24 hr capsule TAKE 2 CAPSULE BY MOUTH TWICE DAILY 120 capsule 6  . Cholecalciferol 2000 UNITS CAPS Take 2,000 Units by mouth daily.     . clindamycin (CLINDAGEL) 1 % gel Apply topically 2 (two) times daily. 30 g 0  . diclofenac sodium (VOLTAREN) 1 % GEL Apply 1 application topically 2 (two) times daily as needed (for neck pain).     . DILTIAZEM CD 180 MG 24 hr capsule TAKE 1 CAPSULE BY MOUTH EVERY DAY (DOSE INCREASE) (Patient taking differently: TAKE 180 MG BY MOUTH EVERY DAY) 30 capsule 6  . furosemide (LASIX) 20 MG tablet TAKE 1 TABLET BY MOUTH EVERY DAY (Patient taking differently: TAKE 20 MG BY MOUTH EVERY DAY AS NEEDED FOR FLUID) 30 tablet 5  . gefitinib (IRESSA) 250 MG tablet Take 1 tablet (250 mg total) by mouth daily. 30 tablet 2  . LUTEIN PO  Take 1 capsule by mouth daily.     . Multiple Vitamins-Minerals (AIRBORNE) CHEW Chew 1 tablet by mouth daily.    . Multiple Vitamins-Minerals (HAIR/SKIN/NAILS) TABS Take 1 tablet by mouth daily.    . Omega-3 Fatty Acids (FISH OIL) 1200 MG CAPS Take 1,200 mg by mouth daily.    . ondansetron (ZOFRAN) 4 MG tablet Take 1 tablet (4 mg total) by mouth every 8 (eight) hours as needed for nausea or vomiting. 20 tablet 0  . Polyvinyl Alcohol-Povidone (REFRESH OP) Place 1 drop into both eyes as needed (for dry eyes).    . potassium chloride (K-DUR,KLOR-CON) 10 MEQ tablet TAKE 1 TABLET BY MOUTH EVERY DAY 30 tablet 6  . warfarin (COUMADIN) 5 MG tablet  TAKE 1 AND 1/2 TABLETS BY MOUTH DAILY EXCEPT 1 TABLET ON MONDAYS, WEDNESDAYS AND FRIDAYS. (Patient taking differently: TAKE 7.5 MG BY MOUTH DAILY EXCEPT 5 MG ON MONDAYS, WEDNESDAYS AND FRIDAYS.) 45 tablet 6   No current facility-administered medications for this visit.    SURGICAL HISTORY:  Past Surgical History  Procedure Laterality Date  . Cataract extraction  2010/2011  . Bladder tack  1996  . Colonoscopy  3 YRS AGO  . Thyroid surgery  1961  . Colonoscopy  03/06/2011    Procedure: COLONOSCOPY;  Surgeon: Rogene Houston, MD;  [facility];  Service: Endoscopy;  Laterality: N/A;  7:30 am  . Tonsillectomy    . Esophagogastroduodenoscopy    . Cardioversion  11/02/09    x 2  . Video bronchoscopy  05/31/2011    Procedure: VIDEO BRONCHOSCOPY;  Surgeon: Grace Isaac, MD;  Location: [facility];  Service: Thoracic;  Laterality: N/A;  . Flexible sigmoidoscopy N/A 10/04/2012    Procedure: FLEXIBLE SIGMOIDOSCOPY;  Surgeon: Rogene Houston, MD;  [facility];  Service: Endoscopy;  Laterality: N/A;  11:00-moved to 1015 Ann to notify pt    REVIEW OF SYSTEMS:  A comprehensive review of systems was negative except for: Integument/breast: positive for rash   PHYSICAL EXAMINATION: General appearance: alert, cooperative, fatigued and no distress Head: Normocephalic, without obvious abnormality, atraumatic Neck: no adenopathy, no JVD, supple, symmetrical, trachea midline and thyroid not enlarged, symmetric, no tenderness/mass/nodules Lymph nodes: Cervical, supraclavicular, and axillary nodes normal. Resp: clear to auscultation bilaterally Back: symmetric, no curvature. ROM normal. No CVA tenderness. Cardio: regular rate and rhythm, S1, S2 normal, no murmur, click, rub or gallop GI: soft, non-tender; bowel sounds normal; no masses,  no organomegaly Extremities: extremities normal, atraumatic, no cyanosis or edema Neurologic: Alert and oriented X 3, normal strength and tone. Normal  symmetric reflexes. Normal coordination and gait  ECOG PERFORMANCE STATUS: 1 - Symptomatic but completely ambulatory  Blood pressure 133/65, pulse 84, temperature 98.9 F (37.2 C), temperature source Oral, resp. rate 18, height 5' 3"  (1.6 m), weight 119 lb 3.2 oz (54.069 kg), SpO2 100 %.  LABORATORY DATA: Lab Results  Component Value Date   WBC 4.6 06/02/2015   HGB 14.5 06/02/2015   HCT 44.4 06/02/2015   MCV 89.6 06/02/2015   PLT 306 06/02/2015      Chemistry      Component Value Date/Time   NA 139 05/05/2015 0830   NA 141 01/07/2014 1636   K 4.2 05/05/2015 0830   K 4.2 01/07/2014 1636   CL 102 01/07/2014 1636   CO2 26 05/05/2015 0830   CO2 29 01/07/2014 1636   BUN 13.8 05/05/2015 0830   BUN 14 01/07/2014 1636   CREATININE  0.7 05/05/2015 0830   CREATININE 0.70 03/10/2015 1706   CREATININE 0.64 07/17/2012 0950      Component Value Date/Time   CALCIUM 9.4 05/05/2015 0830   CALCIUM 9.1 01/07/2014 1636   ALKPHOS 114 05/05/2015 0830   ALKPHOS 58 06/02/2011 0400   AST 21 05/05/2015 0830   AST 28 06/02/2011 0400   ALT 12 05/05/2015 0830   ALT 17 06/02/2011 0400   BILITOT 0.57 05/05/2015 0830   BILITOT 0.5 06/02/2011 0400       RADIOGRAPHIC STUDIES: Ct Renal Stone Study  05/15/2015  CLINICAL DATA:  Acute left flank pain. EXAM: CT ABDOMEN AND PELVIS WITHOUT CONTRAST TECHNIQUE: Multidetector CT imaging of the abdomen and pelvis was performed following the standard protocol without IV contrast. COMPARISON:  PET scan of March 11, 2015. FINDINGS: Multilevel degenerative disc disease is noted in the upper lumbar spine. Minimal right pleural effusion is noted with associated pleural-based nodules consistent with metastatic disease. No gallstones are noted. No focal abnormality is noted in the liver, spleen or pancreas on these unenhanced images. Adrenal glands appear normal. Small bilateral nonobstructive renal calculi are noted. No hydronephrosis or renal obstruction is noted.  Atherosclerosis of abdominal aorta is noted without aneurysm formation. 3 mm calculus is noted in the dependent portion of the urinary bladder suggesting recently passed stone. There is no evidence of bowel obstruction. No abnormal fluid collection is noted. Multiple calcified degenerating fibroids are noted in the uterus. Ovaries are unremarkable. Urinary bladder appears normal. No significant adenopathy is noted. IMPRESSION: Minimal right pleural effusion is noted with associated pleural-based nodules consistent with metastatic disease. Atherosclerosis of abdominal aorta without aneurysm formation. Small bilateral nonobstructive renal calculi are noted. No significant hydronephrosis or renal obstruction is noted. 3 mm calculus seen in dependent portion of urinary bladder suggesting recently passed stone. Multiple calcified degenerating fibroids seen in the uterus. Electronically Signed   By: Marijo Conception, M.D.   On: 05/15/2015 08:10    ASSESSMENT AND PLAN: This is a very pleasant 82 years old white female with recurrent non-small cell lung cancer, adenocarcinoma with positive EGFR mutation with deletion in exon 32 diagnosed in October 2016 and presented with bilateral pulmonary nodules. The patient is currently on Iressa 250 mg by mouth daily and has been tolerating her treatment well except for mild skin rash. I will arrange for the patient to come back for follow-up visit in 4 weeks for reevaluation after repeating CT scan of the chest at [facility] for restaging of her disease. She was advised to call immediately if she has any concerning symptoms in the interval. The patient voices understanding of current disease status and treatment options and is in agreement with the current care plan.  All questions were answered. The patient knows to call the clinic with any problems, questions or concerns. We can certainly see the patient much sooner if necessary.  Disclaimer: This note was  dictated with voice recognition software. Similar sounding words can inadvertently be transcribed and may not be corrected upon review.

## 2015-06-02 NOTE — Telephone Encounter (Signed)
per pof to sch pt appt-gave pt copy of avs-pt stated will go by Natalie Carlson to sch CT scan

## 2015-06-09 MED FILL — *IRESSA 250 MG TABLET: 250 | 30 days supply | Qty: 30 | Fill #1

## 2015-06-22 ENCOUNTER — Ambulatory Visit (INDEPENDENT_AMBULATORY_CARE_PROVIDER_SITE_OTHER): Payer: Medicare Other | Admitting: *Deleted

## 2015-06-22 DIAGNOSIS — I4891 Unspecified atrial fibrillation: Secondary | ICD-10-CM | POA: Diagnosis not present

## 2015-06-22 DIAGNOSIS — Z5181 Encounter for therapeutic drug level monitoring: Secondary | ICD-10-CM

## 2015-06-22 LAB — POCT INR: INR: 3.9

## 2015-06-30 ENCOUNTER — Ambulatory Visit (HOSPITAL_COMMUNITY)
Admission: RE | Admit: 2015-06-30 | Discharge: 2015-06-30 | Disposition: A | Discharge status: Home / Self Care | Payer: Medicare Other | Source: Ambulatory Visit | Attending: Internal Medicine | Admitting: Internal Medicine

## 2015-06-30 DIAGNOSIS — C3411 Malignant neoplasm of upper lobe, right bronchus or lung: Secondary | ICD-10-CM | POA: Diagnosis present

## 2015-06-30 MED ORDER — IOHEXOL 300 MG/ML  SOLN
75.0000 mL | Freq: Once | INTRAMUSCULAR | Status: AC | PRN
  Administered 2015-06-30: 75 mL via INTRAVENOUS

## 2015-07-07 ENCOUNTER — Encounter: Payer: Self-pay | Admitting: Internal Medicine

## 2015-07-07 ENCOUNTER — Other Ambulatory Visit (HOSPITAL_BASED_OUTPATIENT_CLINIC_OR_DEPARTMENT_OTHER): Payer: Medicare Other

## 2015-07-07 ENCOUNTER — Telehealth: Payer: Self-pay | Admitting: Internal Medicine

## 2015-07-07 ENCOUNTER — Ambulatory Visit (HOSPITAL_BASED_OUTPATIENT_CLINIC_OR_DEPARTMENT_OTHER): Payer: Medicare Other | Admitting: Internal Medicine

## 2015-07-07 VITALS — BP 135/72 | HR 94 | Temp 97.6°F | Resp 18 | Ht 63.0 in | Wt 119.6 lb

## 2015-07-07 DIAGNOSIS — R21 Rash and other nonspecific skin eruption: Secondary | ICD-10-CM | POA: Diagnosis not present

## 2015-07-07 DIAGNOSIS — C3411 Malignant neoplasm of upper lobe, right bronchus or lung: Secondary | ICD-10-CM | POA: Diagnosis not present

## 2015-07-07 DIAGNOSIS — Z5181 Encounter for therapeutic drug level monitoring: Secondary | ICD-10-CM

## 2015-07-07 LAB — COMPREHENSIVE METABOLIC PANEL
ALBUMIN: 3.4 g/dL — AB (ref 3.5–5.0)
ALK PHOS: 128 U/L (ref 40–150)
ALT: 15 U/L (ref 0–55)
AST: 24 U/L (ref 5–34)
Anion Gap: 10 mEq/L (ref 3–11)
BILIRUBIN TOTAL: 0.49 mg/dL (ref 0.20–1.20)
BUN: 12.5 mg/dL (ref 7.0–26.0)
CALCIUM: 9.2 mg/dL (ref 8.4–10.4)
CO2: 27 mEq/L (ref 22–29)
Chloride: 105 mEq/L (ref 98–109)
Creatinine: 0.7 mg/dL (ref 0.6–1.1)
EGFR: 80 mL/min/{1.73_m2} — AB (ref >90)
GLUCOSE: 109 mg/dL (ref 70–140)
Potassium: 3.8 mEq/L (ref 3.5–5.1)
SODIUM: 142 meq/L (ref 136–145)
TOTAL PROTEIN: 7.4 g/dL (ref 6.4–8.3)

## 2015-07-07 LAB — CBC WITH DIFFERENTIAL/PLATELET
BASO%: 0.9 % (ref 0.0–2.0)
BASOS ABS: 0 10*3/uL (ref 0.0–0.1)
EOS ABS: 0.2 10*3/uL (ref 0.0–0.5)
EOS%: 3.6 % (ref 0.0–7.0)
HEMATOCRIT: 42.2 % (ref 34.8–46.6)
HEMOGLOBIN: 14 g/dL (ref 11.6–15.9)
LYMPH#: 0.9 10*3/uL (ref 0.9–3.3)
LYMPH%: 20.2 % (ref 14.0–49.7)
MCH: 29.6 pg (ref 25.1–34.0)
MCHC: 33.2 g/dL (ref 31.5–36.0)
MCV: 89.2 fL (ref 79.5–101.0)
MONO#: 0.7 10*3/uL (ref 0.1–0.9)
MONO%: 15.7 % — AB (ref 0.0–14.0)
NEUT%: 59.6 % (ref 38.4–76.8)
NEUTROS ABS: 2.8 10*3/uL (ref 1.5–6.5)
PLATELETS: 331 10*3/uL (ref 145–400)
RBC: 4.73 10*6/uL (ref 3.70–5.45)
RDW: 13.7 % (ref 11.2–14.5)
WBC: 4.7 10*3/uL (ref 3.9–10.3)

## 2015-07-07 NOTE — Telephone Encounter (Signed)
Pt confirmed labs/ov per 02/22 POF, gave pt AVS and Calendar...Marland KitchenMarland KitchenMarland Kitchen KJ

## 2015-07-07 NOTE — Progress Notes (Signed)
[facility] [phone]   [phone]  OFFICE PROGRESS NOTE  [name], MD [address] [place] [address]  DIAGNOSIS: The current non-small cell lung cancer presented with bilateral lung nodules in October 2016, initially diagnosed stage IA non-small cell lung cancer, adenocarcinoma with positive EGFR mutation with deletion in exon 52 diagnosed in December 2012  PRIOR THERAPY: Status post bronchoscopy with right VATS and right upper lobectomy with lymph node dissection under the care of Dr. Servando Snare on 05/31/2011.  CURRENT THERAPY: Iressa 250 mg by mouth daily. First dose started 04/17/2015. Status post 3 months of treatment  INTERVAL HISTORY: Natalie Carlson 82 y.o. female returns to the clinic today for follow-up visit accompanied by her her son and a friend. The patient is feeling fine today with no specific complaints except for mild skin rash on the face and chest as well as dry skin. She is tolerating her treatment with Iressa fairly well. The patient  Had 2 episodes of diarrhea that resolved quickly with Imodium. She has no chest pain, shortness of breath, cough or hemoptysis. The patient denied having any nausea or vomiting. She has no fever or chills. She is here today for evaluation with repeat blood work as well as CT scan of the chest.  MEDICAL HISTORY: Past Medical History  Diagnosis Date  . Hypothyroidism   . Cataract of both eyes   . Mucus in stool   . Chronic diarrhea   . Hyperlipidemia   . Colitis   . History of colon polyps   . History of migraines   . Arthritis   . Chronic back pain   . Hemorrhoids   . Macular degeneration, dry   . Adenocarcinoma of lung ([facility])     Stage  1A  EGFR positive  ALK-negative   . Atrial fibrillation ([facility])   . Nephrolithiasis     1994 - passed on its on  . Malignant neoplasm of right upper lobe of lung (HCC)      stage  1A  EGFR positive  Deletion in exon 19.  ALK-negative  Right  Upper Lobe Lung Mass  SUV 2.9 on PET Status post VATS and right upper lobe lobectomy 05/31/2011    ALLERGIES:  is allergic to doxycycline; entocort ec; neurontin; vicodin; adhesive; amoxicillin; chocolate; codeine; levaquin; morphine and related; naprosyn; penicillins; prednisone; tramadol; and uncoded nonscreenable allergen.  MEDICATIONS:  Current Outpatient Prescriptions  Medication Sig Dispense Refill  . acetaminophen (TYLENOL) 325 MG tablet Take 650 mg by mouth every 6 (six) hours as needed for moderate pain. Reported on 06/02/2015    . ALPRAZolam (XANAX) 0.5 MG tablet Take 0.5 mg by mouth every 8 (eight) hours as needed for anxiety. Reported on 06/02/2015    . APRISO 0.375 G 24 hr capsule TAKE 2 CAPSULE BY MOUTH TWICE DAILY 120 capsule 6  . Cholecalciferol 2000 UNITS CAPS Take 2,000 Units by mouth daily.     . clindamycin (CLINDAGEL) 1 % gel Apply topically 2 (two) times daily. 30 g 0  . diclofenac sodium (VOLTAREN) 1 % GEL Apply 1 application topically 2 (two) times daily as needed (for neck pain).     . DILTIAZEM CD 180 MG 24 hr capsule TAKE 1 CAPSULE BY MOUTH EVERY DAY (DOSE INCREASE) (Patient taking differently: TAKE 180 MG BY MOUTH EVERY DAY) 30 capsule 6  . furosemide (LASIX) 20 MG tablet TAKE 1 TABLET BY MOUTH EVERY DAY (Patient taking differently: TAKE 20  MG BY MOUTH EVERY DAY AS NEEDED FOR FLUID) 30 tablet 5  . Lapatinib Ditosylate (INVESTIGATIONAL LAPATINIB) 250 MG tablet ACORN GSK LPT [id number] Take 1 tablet by mouth daily.  2  . LUTEIN PO Take 1 capsule by mouth daily.     . Multiple Vitamins-Minerals (AIRBORNE) CHEW Chew 1 tablet by mouth daily.    . Multiple Vitamins-Minerals (HAIR/SKIN/NAILS) TABS Take 1 tablet by mouth daily.    . Omega-3 Fatty Acids (FISH OIL) 1200 MG CAPS Take 1,200 mg by mouth daily.    . Polyvinyl Alcohol-Povidone (REFRESH OP) Place 1 drop into both eyes as needed (for dry eyes).    . potassium chloride (K-DUR,KLOR-CON) 10 MEQ tablet TAKE 1 TABLET BY MOUTH  EVERY DAY 30 tablet 6  . warfarin (COUMADIN) 5 MG tablet TAKE 1 AND 1/2 TABLETS BY MOUTH DAILY EXCEPT 1 TABLET ON MONDAYS, WEDNESDAYS AND FRIDAYS. (Patient taking differently: TAKE 7.5 MG BY MOUTH DAILY EXCEPT 5 MG ON MONDAYS, WEDNESDAYS AND FRIDAYS.) 45 tablet 6  . ondansetron (ZOFRAN) 4 MG tablet Take 1 tablet (4 mg total) by mouth every 8 (eight) hours as needed for nausea or vomiting. (Patient not taking: Reported on 07/07/2015) 20 tablet 0   No current facility-administered medications for this visit.    SURGICAL HISTORY:  Past Surgical History  Procedure Laterality Date  . Cataract extraction  2010/2011  . Bladder tack  1996  . Colonoscopy  3 YRS AGO  . Thyroid surgery  1961  . Colonoscopy  03/06/2011    Procedure: COLONOSCOPY;  Surgeon: Rogene Houston, MD;  [facility];  Service: Endoscopy;  Laterality: N/A;  7:30 am  . Tonsillectomy    . Esophagogastroduodenoscopy    . Cardioversion  11/02/09    x 2  . Video bronchoscopy  05/31/2011    Procedure: VIDEO BRONCHOSCOPY;  Surgeon: Grace Isaac, MD;  Location: [facility];  Service: Thoracic;  Laterality: N/A;  . Flexible sigmoidoscopy N/A 10/04/2012    Procedure: FLEXIBLE SIGMOIDOSCOPY;  Surgeon: Rogene Houston, MD;  [facility];  Service: Endoscopy;  Laterality: N/A;  11:00-moved to 1015 Ann to notify pt    REVIEW OF SYSTEMS:  Constitutional: negative Eyes: negative Ears, nose, mouth, throat, and face: negative Respiratory: negative Cardiovascular: negative Gastrointestinal: negative Genitourinary:negative Integument/breast: positive for dryness and rash Hematologic/lymphatic: negative Musculoskeletal:negative Neurological: negative Behavioral/Psych: negative Endocrine: negative Allergic/Immunologic: negative   PHYSICAL EXAMINATION: General appearance: alert, cooperative, fatigued and no distress Head: Normocephalic, without obvious abnormality, atraumatic Neck: no adenopathy, no JVD, supple,  symmetrical, trachea midline and thyroid not enlarged, symmetric, no tenderness/mass/nodules Lymph nodes: Cervical, supraclavicular, and axillary nodes normal. Resp: clear to auscultation bilaterally Back: symmetric, no curvature. ROM normal. No CVA tenderness. Cardio: regular rate and rhythm, S1, S2 normal, no murmur, click, rub or gallop GI: soft, non-tender; bowel sounds normal; no masses,  no organomegaly Extremities: extremities normal, atraumatic, no cyanosis or edema Neurologic: Alert and oriented X 3, normal strength and tone. Normal symmetric reflexes. Normal coordination and gait  ECOG PERFORMANCE STATUS: 1 - Symptomatic but completely ambulatory  Blood pressure 135/72, pulse 94, temperature 97.6 F (36.4 C), temperature source Oral, resp. rate 18, height 5' 3"  (1.6 m), weight 119 lb 9.6 oz (54.25 kg), SpO2 97 %.  LABORATORY DATA: Lab Results  Component Value Date   WBC 4.7 07/07/2015   HGB 14.0 07/07/2015   HCT 42.2 07/07/2015   MCV 89.2 07/07/2015   PLT 331 07/07/2015      Chemistry  Component Value Date/Time   NA 140 06/02/2015 0901   NA 141 01/07/2014 1636   K 4.4 06/02/2015 0901   K 4.2 01/07/2014 1636   CL 102 01/07/2014 1636   CO2 28 06/02/2015 0901   CO2 29 01/07/2014 1636   BUN 12.4 06/02/2015 0901   BUN 14 01/07/2014 1636   CREATININE 0.8 06/02/2015 0901   CREATININE 0.70 03/10/2015 1706   CREATININE 0.64 07/17/2012 0950      Component Value Date/Time   CALCIUM 9.2 06/02/2015 0901   CALCIUM 9.1 01/07/2014 1636   ALKPHOS 121 06/02/2015 0901   ALKPHOS 58 06/02/2011 0400   AST 21 06/02/2015 0901   AST 28 06/02/2011 0400   ALT 14 06/02/2015 0901   ALT 17 06/02/2011 0400   BILITOT 0.57 06/02/2015 0901   BILITOT 0.5 06/02/2011 0400       RADIOGRAPHIC STUDIES: Ct Chest W Contrast  06/30/2015  CLINICAL DATA:  Restaging of right upper lobe lung cancer. EXAM: CT CHEST WITH CONTRAST TECHNIQUE: Multidetector CT imaging of the chest was performed  during intravenous contrast administration. CONTRAST:  45m OMNIPAQUE IOHEXOL 300 MG/ML  SOLN COMPARISON:  PET of 03/11/2015.  Post just CT 03/03/2015. FINDINGS: Mediastinum/Nodes: Diffuse thyroid enlargement with multiple small hypo attenuating nodules, nonspecific. No supraclavicular adenopathy. Tortuous, atherosclerotic thoracic aorta. Cardiomegaly with significant biatrial enlargement. No pericardial effusion. No central pulmonary embolism, on this non-dedicated study. Decreased pretracheal nodal size at 5 mm on image 60 is image 16/ series 2. Compare 8 mm on the prior. Resolution of subcarinal adenopathy.  No hilar adenopathy. Lungs/Pleura: Trace right-sided pleural fluid persists. Extensive pleural-based irregularity including along the right minor fissure. Improved. Example image 16/ series 4 today versus image 22 of the prior PET. Pulmonary parenchymal nodules are also decreased. Example right lower lobe pulmonary nodule of 7 mm on image 26/series 4. Compare 8 mm on the prior PET (when remeasured). Left upper lobe 4 mm nodule on image 28/series 4, decreased from 5 mm on the prior PET. Upper abdomen: Normal imaged portions of the liver, spleen, stomach, pancreas, adrenal glands. Punctate left renal collecting system calculus. Advanced abdominal aortic atherosclerosis. Musculoskeletal: Convex left lumbar spine curvature. IMPRESSION: 1. Response to therapy since the 03/11/15 PET. 2. Decrease in bilateral pulmonary parenchymal nodules and areas of right-sided pleural thickening/irregularity. 3. Improved to resolved thoracic adenopathy. 4. Trace right pleural fluid persists. 5. No new sites of disease. Electronically Signed   By: KAbigail MiyamotoM.D.   On: 06/30/2015 09:52    ASSESSMENT AND PLAN: This is a very pleasant 82years old white female with recurrent non-small cell lung cancer, adenocarcinoma with positive EGFR mutation with deletion in exon 190diagnosed in October 2016 and presented with bilateral  pulmonary nodules. The patient is currently on Iressa 250 mg by mouth daily and has been tolerating her treatment well except for mild skin rash and dry skin.  her recent CT scan of the chest showed significant improvement of her disease. I personally reviewed the scan images. I discussed the scan results with the patient and her family. I recommended for her to continue her current treatment with Iressa 250 mg by mouth daily as a scheduled.  For the dry skin she was advised to use lotion at regular basis.  She will come back for follow-up visit in one month's for evaluation with repeat CBC and comprehensive metabolic panel. She was advised to call immediately if she has any concerning symptoms in the interval. The patient voices understanding  of current disease status and treatment options and is in agreement with the current care plan.  All questions were answered. The patient knows to call the clinic with any problems, questions or concerns. We can certainly see the patient much sooner if necessary.  Disclaimer: This note was dictated with voice recognition software. Similar sounding words can inadvertently be transcribed and may not be corrected upon review.

## 2015-07-08 ENCOUNTER — Ambulatory Visit (INDEPENDENT_AMBULATORY_CARE_PROVIDER_SITE_OTHER): Payer: Medicare Other | Admitting: *Deleted

## 2015-07-08 DIAGNOSIS — I4891 Unspecified atrial fibrillation: Secondary | ICD-10-CM | POA: Diagnosis not present

## 2015-07-08 DIAGNOSIS — Z5181 Encounter for therapeutic drug level monitoring: Secondary | ICD-10-CM

## 2015-07-08 LAB — POCT INR: INR: 2.6

## 2015-07-08 MED FILL — *IRESSA 250 MG TABLET: 250 | 30 days supply | Qty: 30 | Fill #2

## 2015-07-22 ENCOUNTER — Ambulatory Visit (INDEPENDENT_AMBULATORY_CARE_PROVIDER_SITE_OTHER): Payer: Medicare Other | Admitting: Cardiology

## 2015-07-22 ENCOUNTER — Encounter: Payer: Self-pay | Admitting: Cardiology

## 2015-07-22 VITALS — BP 110/78 | HR 70 | Ht 63.0 in | Wt 117.0 lb

## 2015-07-22 DIAGNOSIS — L299 Pruritus, unspecified: Secondary | ICD-10-CM | POA: Diagnosis not present

## 2015-07-22 DIAGNOSIS — E785 Hyperlipidemia, unspecified: Secondary | ICD-10-CM | POA: Diagnosis not present

## 2015-07-22 DIAGNOSIS — I482 Chronic atrial fibrillation, unspecified: Secondary | ICD-10-CM

## 2015-07-22 DIAGNOSIS — R600 Localized edema: Secondary | ICD-10-CM

## 2015-07-22 DIAGNOSIS — I5032 Chronic diastolic (congestive) heart failure: Secondary | ICD-10-CM

## 2015-07-22 MED ORDER — FUROSEMIDE 20 MG PO TABS: ORAL_TABLET | ORAL | Status: DC

## 2015-07-22 MED ORDER — POTASSIUM CHLORIDE CRYS ER 10 MEQ PO TBCR: 10.0000 meq | EXTENDED_RELEASE_TABLET | Freq: Every day | ORAL | Status: DC

## 2015-07-22 NOTE — Progress Notes (Signed)
Cardiology Office Note  Date: 07/22/2015   ID: Natalie Carlson, DOB 31-Jul-1929, MRN [id number]  PCP: [name], MD  Primary Cardiologist: Rozann Lesches, MD   Chief Complaint  Patient presents with  . Atrial Fibrillation    History of Present Illness: Natalie Carlson is an 82 y.o. female last seen in July 2016. She presents for a routine follow-up visit. Ports no significant palpitations or exertional chest pain. She has chronic stable dyspnea on exertion, NYHA class II with typical ADLs. She is able to do housework and some light yard work. She stops and takes breaks.  She continues on Coumadin with follow-up in the anticoagulation clinic. No reported bleeding problems.  I reviewed her chart, she continues to follow with oncology for management of lung cancer. She is currently on Iressa and sees Dr. Julien Nordmann. She reports dry skin and itching, has also had a rash on her legs.  I reviewed her medications. Cardiac regimen includes Cardizem CD, Lasix, potassium supplements, and Coumadin. I reviewed her ECG done today which shows rate-controlled atrial fibrillation with borderline low voltage and decreased R wave progression.  She does state that she has had worsening leg edema over the last few months. She has been taking her Lasix every day instead of as needed. Weight has not increased significantly.  Past Medical History  Diagnosis Date  . Hypothyroidism   . Cataract of both eyes   . Mucus in stool   . Chronic diarrhea   . Hyperlipidemia   . Colitis   . History of colon polyps   . History of migraines   . Arthritis   . Chronic back pain   . Hemorrhoids   . Macular degeneration, dry   . Atrial fibrillation ([facility])   . Nephrolithiasis     1994 - passed on its on  . Malignant neoplasm of right upper lobe of lung (HCC)     Stage 1A  EGFR positive  Deletion in exon 19.  ALK-negative  Right Upper Lobe Lung Mass  SUV 2.9 on PET Status post VATS and right upper lobe lobectomy  05/31/2011    Past Surgical History  Procedure Laterality Date  . Cataract extraction  2010/2011  . Bladder tack  1996  . Colonoscopy  3 YRS AGO  . Thyroid surgery  1961  . Colonoscopy  03/06/2011    Procedure: COLONOSCOPY;  Surgeon: Rogene Houston, MD;  [facility];  Service: Endoscopy;  Laterality: N/A;  7:30 am  . Tonsillectomy    . Esophagogastroduodenoscopy    . Cardioversion  11/02/09    x 2  . Video bronchoscopy  05/31/2011    Procedure: VIDEO BRONCHOSCOPY;  Surgeon: Grace Isaac, MD;  Location: [facility];  Service: Thoracic;  Laterality: N/A;  . Flexible sigmoidoscopy N/A 10/04/2012    Procedure: FLEXIBLE SIGMOIDOSCOPY;  Surgeon: Rogene Houston, MD;  [facility];  Service: Endoscopy;  Laterality: N/A;  11:00-moved to 1015 Ann to notify pt    Current Outpatient Prescriptions  Medication Sig Dispense Refill  . acetaminophen (TYLENOL) 325 MG tablet Take 650 mg by mouth every 6 (six) hours as needed for moderate pain. Reported on 06/02/2015    . ALPRAZolam (XANAX) 0.5 MG tablet Take 0.5 mg by mouth every 8 (eight) hours as needed for anxiety. Reported on 06/02/2015    . APRISO 0.375 G 24 hr capsule TAKE 2 CAPSULE BY MOUTH TWICE DAILY 120 capsule 6  . Cholecalciferol 2000 UNITS CAPS  Take 2,000 Units by mouth daily.     . clindamycin (CLINDAGEL) 1 % gel Apply topically 2 (two) times daily. 30 g 0  . diclofenac sodium (VOLTAREN) 1 % GEL Apply 1 application topically 2 (two) times daily as needed (for neck pain).     . DILTIAZEM CD 180 MG 24 hr capsule TAKE 1 CAPSULE BY MOUTH EVERY DAY (DOSE INCREASE) (Patient taking differently: TAKE 180 MG BY MOUTH EVERY DAY) 30 capsule 6  . furosemide (LASIX) 20 MG tablet TAKE 20 MG BY MOUTH EVERY DAY AS NEEDED FOR FLUID 30 tablet 5  . gefitinib (IRESSA) 250 MG tablet Take 250 mg by mouth daily.    . Lapatinib Ditosylate (INVESTIGATIONAL LAPATINIB) 250 MG tablet ACORN GSK LPT [id number] Take 1 tablet by mouth daily.  2  . LUTEIN  PO Take 1 capsule by mouth daily.     . Multiple Vitamins-Minerals (AIRBORNE) CHEW Chew 1 tablet by mouth daily.    . Multiple Vitamins-Minerals (HAIR/SKIN/NAILS) TABS Take 1 tablet by mouth daily.    . Omega-3 Fatty Acids (FISH OIL) 1200 MG CAPS Take 1,200 mg by mouth daily.    . ondansetron (ZOFRAN) 4 MG tablet Take 1 tablet (4 mg total) by mouth every 8 (eight) hours as needed for nausea or vomiting. 20 tablet 0  . Polyvinyl Alcohol-Povidone (REFRESH OP) Place 1 drop into both eyes as needed (for dry eyes).    . potassium chloride (K-DUR,KLOR-CON) 10 MEQ tablet Take 1 tablet (10 mEq total) by mouth daily. 30 tablet 6  . warfarin (COUMADIN) 5 MG tablet TAKE 1 AND 1/2 TABLETS BY MOUTH DAILY EXCEPT 1 TABLET ON MONDAYS, WEDNESDAYS AND FRIDAYS. (Patient taking differently: TAKE 7.5 MG BY MOUTH DAILY EXCEPT 5 MG ON MONDAYS, WEDNESDAYS AND FRIDAYS.) 45 tablet 6   No current facility-administered medications for this visit.   Allergies:  Doxycycline; Entocort ec; Neurontin; Vicodin; Adhesive; Amoxicillin; Chocolate; Codeine; Levaquin; Morphine and related; Naprosyn; Penicillins; Prednisone; Tramadol; and Uncoded nonscreenable allergen   Social History: The patient  reports that she has never smoked. She has never used smokeless tobacco. She reports that she does not drink alcohol or use illicit drugs.   ROS:  Please see the history of present illness. Otherwise, complete review of systems is positive for worsening leg edema, chronic shortness of breath, musculoskeletal neck discomfort intermittently.  All other systems are reviewed and negative.   Physical Exam: VS:  BP 110/78 mmHg  Pulse 70  Ht 5' 3"  (1.6 m)  Wt 117 lb (53.071 kg)  BMI 20.73 kg/m2  SpO2 95%, BMI Body mass index is 20.73 kg/(m^2).  Wt Readings from Last 3 Encounters:  07/22/15 117 lb (53.071 kg)  07/07/15 119 lb 9.6 oz (54.25 kg)  06/02/15 119 lb 3.2 oz (54.069 kg)    Pleasant elderly woman, appears comfortable at rest    HEENT: Conjunctiva and lids normal, oropharynx clear.  Neck: Supple, no elevated JVP with prominent external jugular. Lungs: Diminished but cleaar to auscultation, nonlabored breathing at rest.  Cardiac: Irregularly irregular, no S3 or significant systolic murmur, no pericardial rub.  Abdomen: Soft, nontender, bowel sounds present.  Extremities: 1+ leg  edema, distal pulses 2+.  Skin: Warm and dry. Maculopapular erythema on the legs distally. Musculoskeletal: No kyphosis. Neuropsychiatric: Alert and oriented 3, affect appropriate.   ECG: I personally reviewed the prior tracing from 01/07/2014 which showed rate-controlled atrial fibrillation with decreased R wave progression.  Recent Labwork: 07/07/2015: ALT 15; AST 24; BUN 12.5; Creatinine 0.7;  HGB 14.0; Platelets 331; Potassium 3.8; Sodium 142   Other Studies Reviewed Today:  Echocardiogram September 2015: LVEF 60-65% with indeterminate diastolic function in the setting of atrial fibrillation/flutter, MAC with mild mitral regurgitation, mild to moderate RV enlargement with normal contraction, mild to moderate tricuspid regurgitation with PASP 34 mmHg.  Carotid Dopplers 07/10/2014: 1-39% bilateral ICA stenoses.  Chest CT 06/30/2015: FINDINGS: Mediastinum/Nodes: Diffuse thyroid enlargement with multiple small hypo attenuating nodules, nonspecific. No supraclavicular adenopathy. Tortuous, atherosclerotic thoracic aorta. Cardiomegaly with significant biatrial enlargement. No pericardial effusion. No central pulmonary embolism, on this non-dedicated study.  Decreased pretracheal nodal size at 5 mm on image 60 is image 16/ series 2. Compare 8 mm on the prior.  Resolution of subcarinal adenopathy. No hilar adenopathy.  Lungs/Pleura: Trace right-sided pleural fluid persists.  Extensive pleural-based irregularity including along the right minor fissure. Improved. Example image 16/ series 4 today versus image 22 of the prior  PET.  Pulmonary parenchymal nodules are also decreased. Example right lower lobe pulmonary nodule of 7 mm on image 26/series 4. Compare 8 mm on the prior PET (when remeasured).  Left upper lobe 4 mm nodule on image 28/series 4, decreased from 5 mm on the prior PET.  Upper abdomen: Normal imaged portions of the liver, spleen, stomach, pancreas, adrenal glands. Punctate left renal collecting system calculus. Advanced abdominal aortic atherosclerosis.  Musculoskeletal: Convex left lumbar spine curvature.  IMPRESSION: 1. Response to therapy since the 03/11/15 PET. 2. Decrease in bilateral pulmonary parenchymal nodules and areas of right-sided pleural thickening/irregularity. 3. Improved to resolved thoracic adenopathy. 4. Trace right pleural fluid persists. 5. No new sites of disease.  Assessment and Plan:  1. Worsening leg edema. Patient states that her leg swelling improves after her legs are up overnight when she is sleeping. She has history of normal LVEF, likely some degree of diastolic dysfunction with atrial fibrillation. She will continue with Lasix and potassium supplements, increase Lasix to 40 mg and KCl 20 mEq 3 days out of the week. If symptoms progress we will consider a follow-up echocardiogram. Elevate legs when able.   2. Chronic atrial fibrillation, asymptomatic in terms of palpitations. She continues on Coumadin with follow-up in the anticoagulation clinic and has had no spontaneous bleeding problems. Heart rate is well controlled today on Cardizem CD.   3. History of hyperlipidemia, on omega-3 supplements. She follows with routine lab work per Dr. Eula Fried.   4. Patient reports dry skin and itching, currently on  Iressa for oncology.  She has some maculopapular erythema on her legs but also with increased edema this could be related.  Current medicines were reviewed with the patient today.   Orders Placed This Encounter  Procedures  . EKG 12-Lead     Disposition: FU with me in 6 months.   Signed, Satira Sark, MD, [facility] 07/22/2015 8:54 AM    [facility], [place], [place] [address] Phone: [phone]; Fax: [phone]

## 2015-07-22 NOTE — Patient Instructions (Signed)
Your physician recommends that you continue on your current medications as directed. Please refer to the Current Medication list given to you today. Take furosemide 40 mg 3 days per week with 20 meq of potassium. Your physician recommends that you schedule a follow-up appointment in: 6 months. You will receive a reminder letter in the mail in about 4 months reminding you to call and schedule your appointment. If you don't receive this letter, please contact our office.

## 2015-08-04 ENCOUNTER — Other Ambulatory Visit (HOSPITAL_BASED_OUTPATIENT_CLINIC_OR_DEPARTMENT_OTHER): Payer: Medicare Other

## 2015-08-04 ENCOUNTER — Encounter: Payer: Self-pay | Admitting: Internal Medicine

## 2015-08-04 ENCOUNTER — Ambulatory Visit (HOSPITAL_BASED_OUTPATIENT_CLINIC_OR_DEPARTMENT_OTHER): Payer: Medicare Other | Admitting: Internal Medicine

## 2015-08-04 ENCOUNTER — Telehealth: Payer: Self-pay | Admitting: Internal Medicine

## 2015-08-04 VITALS — BP 128/64 | HR 78 | Temp 97.5°F | Resp 18 | Ht 63.0 in | Wt 117.6 lb

## 2015-08-04 DIAGNOSIS — Z5181 Encounter for therapeutic drug level monitoring: Secondary | ICD-10-CM

## 2015-08-04 DIAGNOSIS — R21 Rash and other nonspecific skin eruption: Secondary | ICD-10-CM

## 2015-08-04 DIAGNOSIS — C3411 Malignant neoplasm of upper lobe, right bronchus or lung: Secondary | ICD-10-CM

## 2015-08-04 DIAGNOSIS — E039 Hypothyroidism, unspecified: Secondary | ICD-10-CM

## 2015-08-04 LAB — CBC WITH DIFFERENTIAL/PLATELET
BASO%: 1.1 % (ref 0.0–2.0)
Basophils Absolute: 0.1 10*3/uL (ref 0.0–0.1)
EOS ABS: 0.2 10*3/uL (ref 0.0–0.5)
EOS%: 3.9 % (ref 0.0–7.0)
HEMATOCRIT: 43.2 % (ref 34.8–46.6)
HGB: 14.1 g/dL (ref 11.6–15.9)
LYMPH#: 1.2 10*3/uL (ref 0.9–3.3)
LYMPH%: 21.2 % (ref 14.0–49.7)
MCH: 29.1 pg (ref 25.1–34.0)
MCHC: 32.6 g/dL (ref 31.5–36.0)
MCV: 89.2 fL (ref 79.5–101.0)
MONO#: 0.9 10*3/uL (ref 0.1–0.9)
MONO%: 17.1 % — ABNORMAL HIGH (ref 0.0–14.0)
NEUT#: 3.1 10*3/uL (ref 1.5–6.5)
NEUT%: 56.7 % (ref 38.4–76.8)
PLATELETS: 295 10*3/uL (ref 145–400)
RBC: 4.84 10*6/uL (ref 3.70–5.45)
RDW: 14.2 % (ref 11.2–14.5)
WBC: 5.5 10*3/uL (ref 3.9–10.3)

## 2015-08-04 LAB — COMPREHENSIVE METABOLIC PANEL
ALT: 15 U/L (ref 0–55)
ANION GAP: 7 meq/L (ref 3–11)
AST: 26 U/L (ref 5–34)
Albumin: 3.5 g/dL (ref 3.5–5.0)
Alkaline Phosphatase: 128 U/L (ref 40–150)
BILIRUBIN TOTAL: 0.56 mg/dL (ref 0.20–1.20)
BUN: 14.1 mg/dL (ref 7.0–26.0)
CALCIUM: 8.9 mg/dL (ref 8.4–10.4)
CHLORIDE: 102 meq/L (ref 98–109)
CO2: 30 mEq/L — ABNORMAL HIGH (ref 22–29)
CREATININE: 0.9 mg/dL (ref 0.6–1.1)
EGFR: 56 mL/min/{1.73_m2} — AB (ref >90)
Glucose: 98 mg/dl (ref 70–140)
Potassium: 4.7 mEq/L (ref 3.5–5.1)
Sodium: 139 mEq/L (ref 136–145)
TOTAL PROTEIN: 7.4 g/dL (ref 6.4–8.3)

## 2015-08-04 NOTE — Progress Notes (Signed)
[facility] [phone]   [phone]  OFFICE PROGRESS NOTE  [name], MD [address] [place] [address]  DIAGNOSIS: The current non-small cell lung cancer presented with bilateral lung nodules in October 2016, initially diagnosed stage IA non-small cell lung cancer, adenocarcinoma with positive EGFR mutation with deletion in exon 72 diagnosed in December 2012  PRIOR THERAPY: Status post bronchoscopy with right VATS and right upper lobectomy with lymph node dissection under the care of Dr. Servando Snare on 05/31/2011.  CURRENT THERAPY: Iressa 250 mg by mouth daily. First dose started 04/17/2015. Status post 4 months of treatment  INTERVAL HISTORY: Natalie Carlson 82 y.o. female returns to the clinic today for follow-up visit accompanied by her her son and a friend. The patient is feeling fine today with no specific complaints. She continues to tolerate her treatment with Iressa fairly well except for mild skin rash on the face and chest as well as dry skin. She has no significant diarrhea. She has no chest pain, shortness of breath, cough or hemoptysis. The patient denied having any nausea or vomiting. She has no fever or chills. She had repeat CBC and comprehensive metabolic panel performed earlier today and she is here for evaluation and discussion of her lab results.  MEDICAL HISTORY: Past Medical History  Diagnosis Date  . Hypothyroidism   . Cataract of both eyes   . Mucus in stool   . Chronic diarrhea   . Hyperlipidemia   . Colitis   . History of colon polyps   . History of migraines   . Arthritis   . Chronic back pain   . Hemorrhoids   . Macular degeneration, dry   . Atrial fibrillation ([place])   . Nephrolithiasis     1994 - passed on its on  . Malignant neoplasm of right upper lobe of lung (HCC)     Stage 1A  EGFR positive  Deletion in exon 19.  ALK-negative  Right Upper Lobe Lung Mass  SUV 2.9 on PET Status post VATS and right  upper lobe lobectomy 05/31/2011    ALLERGIES:  is allergic to doxycycline; entocort ec; neurontin; vicodin; adhesive; amoxicillin; chocolate; codeine; levaquin; morphine and related; naprosyn; penicillins; prednisone; tramadol; and uncoded nonscreenable allergen.  MEDICATIONS:  Current Outpatient Prescriptions  Medication Sig Dispense Refill  . acetaminophen (TYLENOL) 325 MG tablet Take 650 mg by mouth every 6 (six) hours as needed for moderate pain. Reported on 06/02/2015    . ALPRAZolam (XANAX) 0.5 MG tablet Take 0.5 mg by mouth every 8 (eight) hours as needed for anxiety. Reported on 06/02/2015    . APRISO 0.375 G 24 hr capsule TAKE 2 CAPSULE BY MOUTH TWICE DAILY 120 capsule 6  . Cholecalciferol 2000 UNITS CAPS Take 2,000 Units by mouth daily.     . clindamycin (CLINDAGEL) 1 % gel Apply topically 2 (two) times daily. 30 g 0  . diclofenac sodium (VOLTAREN) 1 % GEL Apply 1 application topically 2 (two) times daily as needed (for neck pain).     . DILTIAZEM CD 180 MG 24 hr capsule TAKE 1 CAPSULE BY MOUTH EVERY DAY (DOSE INCREASE) (Patient taking differently: TAKE 180 MG BY MOUTH EVERY DAY) 30 capsule 6  . furosemide (LASIX) 20 MG tablet TAKE 20 MG BY MOUTH EVERY DAY AS NEEDED FOR FLUID 30 tablet 5  . gefitinib (IRESSA) 250 MG tablet Take 250 mg by mouth daily.    . Lapatinib Ditosylate (INVESTIGATIONAL LAPATINIB)  250 MG tablet ACORN GSK LPT [id number] Take 1 tablet by mouth daily.  2  . LUTEIN PO Take 1 capsule by mouth daily.     . Multiple Vitamins-Minerals (AIRBORNE) CHEW Chew 1 tablet by mouth daily.    . Multiple Vitamins-Minerals (HAIR/SKIN/NAILS) TABS Take 1 tablet by mouth daily.    . Omega-3 Fatty Acids (FISH OIL) 1200 MG CAPS Take 1,200 mg by mouth daily.    . ondansetron (ZOFRAN) 4 MG tablet Take 1 tablet (4 mg total) by mouth every 8 (eight) hours as needed for nausea or vomiting. 20 tablet 0  . Polyvinyl Alcohol-Povidone (REFRESH OP) Place 1 drop into both eyes as needed (for dry eyes).     . potassium chloride (K-DUR,KLOR-CON) 10 MEQ tablet Take 1 tablet (10 mEq total) by mouth daily. 30 tablet 6  . warfarin (COUMADIN) 5 MG tablet TAKE 1 AND 1/2 TABLETS BY MOUTH DAILY EXCEPT 1 TABLET ON MONDAYS, WEDNESDAYS AND FRIDAYS. (Patient taking differently: TAKE 7.5 MG BY MOUTH DAILY EXCEPT 5 MG ON MONDAYS, WEDNESDAYS AND FRIDAYS.) 45 tablet 6   No current facility-administered medications for this visit.    SURGICAL HISTORY:  Past Surgical History  Procedure Laterality Date  . Cataract extraction  2010/2011  . Bladder tack  1996  . Colonoscopy  3 YRS AGO  . Thyroid surgery  1961  . Colonoscopy  03/06/2011    Procedure: COLONOSCOPY;  Surgeon: Rogene Houston, MD;  [facility];  Service: Endoscopy;  Laterality: N/A;  7:30 am  . Tonsillectomy    . Esophagogastroduodenoscopy    . Cardioversion  11/02/09    x 2  . Video bronchoscopy  05/31/2011    Procedure: VIDEO BRONCHOSCOPY;  Surgeon: Grace Isaac, MD;  Location: [facility];  Service: Thoracic;  Laterality: N/A;  . Flexible sigmoidoscopy N/A 10/04/2012    Procedure: FLEXIBLE SIGMOIDOSCOPY;  Surgeon: Rogene Houston, MD;  [facility];  Service: Endoscopy;  Laterality: N/A;  11:00-moved to 1015 Ann to notify pt    REVIEW OF SYSTEMS:  A comprehensive review of systems was negative except for: Integument/breast: positive for dryness and rash   PHYSICAL EXAMINATION: General appearance: alert, cooperative, fatigued and no distress Head: Normocephalic, without obvious abnormality, atraumatic Neck: no adenopathy, no JVD, supple, symmetrical, trachea midline and thyroid not enlarged, symmetric, no tenderness/mass/nodules Lymph nodes: Cervical, supraclavicular, and axillary nodes normal. Resp: clear to auscultation bilaterally Back: symmetric, no curvature. ROM normal. No CVA tenderness. Cardio: regular rate and rhythm, S1, S2 normal, no murmur, click, rub or gallop GI: soft, non-tender; bowel sounds normal; no  masses,  no organomegaly Extremities: extremities normal, atraumatic, no cyanosis or edema Neurologic: Alert and oriented X 3, normal strength and tone. Normal symmetric reflexes. Normal coordination and gait  ECOG PERFORMANCE STATUS: 1 - Symptomatic but completely ambulatory  Blood pressure 128/64, pulse 78, temperature 97.5 F (36.4 C), temperature source Oral, resp. rate 18, height 5' 3"  (1.6 m), weight 117 lb 9.6 oz (53.343 kg), SpO2 98 %.  LABORATORY DATA: Lab Results  Component Value Date   WBC 4.7 07/07/2015   HGB 14.0 07/07/2015   HCT 42.2 07/07/2015   MCV 89.2 07/07/2015   PLT 331 07/07/2015      Chemistry      Component Value Date/Time   NA 142 07/07/2015 1302   NA 141 01/07/2014 1636   K 3.8 07/07/2015 1302   K 4.2 01/07/2014 1636   CL 102 01/07/2014 1636   CO2 27  07/07/2015 1302   CO2 29 01/07/2014 1636   BUN 12.5 07/07/2015 1302   BUN 14 01/07/2014 1636   CREATININE 0.7 07/07/2015 1302   CREATININE 0.70 03/10/2015 1706   CREATININE 0.64 07/17/2012 0950      Component Value Date/Time   CALCIUM 9.2 07/07/2015 1302   CALCIUM 9.1 01/07/2014 1636   ALKPHOS 128 07/07/2015 1302   ALKPHOS 58 06/02/2011 0400   AST 24 07/07/2015 1302   AST 28 06/02/2011 0400   ALT 15 07/07/2015 1302   ALT 17 06/02/2011 0400   BILITOT 0.49 07/07/2015 1302   BILITOT 0.5 06/02/2011 0400       RADIOGRAPHIC STUDIES: No results found.  ASSESSMENT AND PLAN: This is a very pleasant 82 years old white female with recurrent non-small cell lung cancer, adenocarcinoma with positive EGFR mutation with deletion in exon 30 diagnosed in October 2016 and presented with bilateral pulmonary nodules. The patient is currently on Iressa 250 mg by mouth daily Status post 4 months of treatment and has been tolerating her treatment well except for mild skin rash and dry skin. I recommended for her to continue her current treatment with Iressa 250 mg by mouth daily as a scheduled. For the dry skin  she was advised to use lotion at regular basis. She will come back for follow-up visit in one month for evaluation with repeat CBC and comprehensive metabolic panel. She was advised to call immediately if she has any concerning symptoms in the interval. The patient voices understanding of current disease status and treatment options and is in agreement with the current care plan.  All questions were answered. The patient knows to call the clinic with any problems, questions or concerns. We can certainly see the patient much sooner if necessary.  Disclaimer: This note was dictated with voice recognition software. Similar sounding words can inadvertently be transcribed and may not be corrected upon review.

## 2015-08-04 NOTE — Telephone Encounter (Signed)
Gave and printed appt shced and avs for pt for April

## 2015-08-05 ENCOUNTER — Ambulatory Visit (INDEPENDENT_AMBULATORY_CARE_PROVIDER_SITE_OTHER): Payer: Medicare Other | Admitting: *Deleted

## 2015-08-05 ENCOUNTER — Telehealth: Payer: Self-pay | Admitting: Cardiology

## 2015-08-05 DIAGNOSIS — I4891 Unspecified atrial fibrillation: Secondary | ICD-10-CM

## 2015-08-05 DIAGNOSIS — Z5181 Encounter for therapeutic drug level monitoring: Secondary | ICD-10-CM

## 2015-08-05 LAB — POCT INR: INR: 2.5

## 2015-08-05 NOTE — Telephone Encounter (Signed)
Spoke with patient and she said that her heart was racing Tuesday night that woke her up out of her sleep and also said that her lips felt funny. Patient did not check her HR during that time. Patient said it has not happened since that time. No c/o chest pain, dizziness or sob. Patient was given an appointment to see Domenic Polite next week and advised to contact our office if her symptom returned or if it got worse to go to the ED for an evaluation. Patient verbalized understanding of plan.

## 2015-08-05 NOTE — Telephone Encounter (Signed)
Patient walked in stated that she had a bad with AFIB that woke her up from sleeping Tuesday night  August 03, 2015. Dr Inda Merlin advised her to contact our office in reference to this. She would like for someone to call her

## 2015-08-09 ENCOUNTER — Other Ambulatory Visit: Payer: Self-pay | Admitting: Internal Medicine

## 2015-08-09 MED FILL — *IRESSA 250 MG TABLET: 250 | 30 days supply | Qty: 30 | Fill #0

## 2015-08-10 ENCOUNTER — Encounter: Payer: Self-pay | Admitting: Cardiology

## 2015-08-10 ENCOUNTER — Ambulatory Visit (INDEPENDENT_AMBULATORY_CARE_PROVIDER_SITE_OTHER): Payer: Medicare Other | Admitting: Cardiology

## 2015-08-10 VITALS — BP 104/63 | HR 78 | Ht 63.0 in | Wt 119.0 lb

## 2015-08-10 DIAGNOSIS — I482 Chronic atrial fibrillation, unspecified: Secondary | ICD-10-CM

## 2015-08-10 DIAGNOSIS — E785 Hyperlipidemia, unspecified: Secondary | ICD-10-CM

## 2015-08-10 DIAGNOSIS — R600 Localized edema: Secondary | ICD-10-CM | POA: Diagnosis not present

## 2015-08-10 DIAGNOSIS — R002 Palpitations: Secondary | ICD-10-CM

## 2015-08-10 NOTE — Patient Instructions (Signed)
Your physician recommends that you continue on your current medications as directed. Please refer to the Current Medication list given to you today. Your physician recommends that you schedule a follow-up appointment in: 6 months. You will receive a reminder letter in the mail in about 4 months reminding you to call and schedule your appointment. If you don't receive this letter, please contact our office. 

## 2015-08-10 NOTE — Progress Notes (Signed)
Cardiology Office Note  Date: 08/10/2015   ID: Natalie Carlson, DOB July 04, 1929, MRN [id number]  PCP: [name], MD  Primary Cardiologist: Rozann Lesches, MD   Chief Complaint  Patient presents with  . Atrial Fibrillation    History of Present Illness: Natalie Carlson is an 82 y.o. female seen earlier in March of this year. I reviewed the chart and subsequent telephone note. She describes an episode where she awoke early a.m. around 4:00 with a feeling of rapid heartbeat. No chest pain or shortness of breath. This lasted for about 2 hours and then went away on its own. She has had no subsequent recurrence. She states that she has been taking her medications regularly, has not missed any doses of Cardizem CD.  She continues on Coumadin with follow-up in the anticoagulation clinic.no reported bleeding episodes.  She states that her leg edema has been controlled on current dose of Lasix. Weight is up 1-2 pounds overall.  I reviewed her ECG today which shows rate-controlled atrial fibrillation at 73 bpm.  Past Medical History  Diagnosis Date  . Hypothyroidism   . Cataract of both eyes   . Mucus in stool   . Chronic diarrhea   . Hyperlipidemia   . Colitis   . History of colon polyps   . History of migraines   . Arthritis   . Chronic back pain   . Hemorrhoids   . Macular degeneration, dry   . Atrial fibrillation ([place])   . Nephrolithiasis     1994 - passed on its on  . Malignant neoplasm of right upper lobe of lung (HCC)     Stage 1A  EGFR positive  Deletion in exon 19.  ALK-negative  Right Upper Lobe Lung Mass  SUV 2.9 on PET Status post VATS and right upper lobe lobectomy 05/31/2011    Past Surgical History  Procedure Laterality Date  . Cataract extraction  2010/2011  . Bladder tack  1996  . Colonoscopy  3 YRS AGO  . Thyroid surgery  1961  . Colonoscopy  03/06/2011    Procedure: COLONOSCOPY;  Surgeon: Rogene Houston, MD;  [facility];  Service: Endoscopy;   Laterality: N/A;  7:30 am  . Tonsillectomy    . Esophagogastroduodenoscopy    . Cardioversion  11/02/09    x 2  . Video bronchoscopy  05/31/2011    Procedure: VIDEO BRONCHOSCOPY;  Surgeon: Grace Isaac, MD;  Location: [facility];  Service: Thoracic;  Laterality: N/A;  . Flexible sigmoidoscopy N/A 10/04/2012    Procedure: FLEXIBLE SIGMOIDOSCOPY;  Surgeon: Rogene Houston, MD;  [facility];  Service: Endoscopy;  Laterality: N/A;  11:00-moved to 1015 Ann to notify pt    Current Outpatient Prescriptions  Medication Sig Dispense Refill  . acetaminophen (TYLENOL) 325 MG tablet Take 650 mg by mouth every 6 (six) hours as needed for moderate pain. Reported on 06/02/2015    . ALPRAZolam (XANAX) 0.5 MG tablet Take 0.5 mg by mouth every 8 (eight) hours as needed for anxiety. Reported on 06/02/2015    . APRISO 0.375 G 24 hr capsule TAKE 2 CAPSULE BY MOUTH TWICE DAILY 120 capsule 6  . Cholecalciferol 2000 UNITS CAPS Take 2,000 Units by mouth daily.     . clindamycin (CLINDAGEL) 1 % gel Apply topically 2 (two) times daily. 30 g 0  . diclofenac sodium (VOLTAREN) 1 % GEL Apply 1 application topically 2 (two) times daily as needed (for neck pain).     Marland Kitchen  DILTIAZEM CD 180 MG 24 hr capsule TAKE 1 CAPSULE BY MOUTH EVERY DAY (DOSE INCREASE) (Patient taking differently: TAKE 180 MG BY MOUTH EVERY DAY) 30 capsule 6  . furosemide (LASIX) 20 MG tablet TAKE 20 MG BY MOUTH EVERY DAY AS NEEDED FOR FLUID 30 tablet 5  . gefitinib (IRESSA) 250 MG tablet Take 250 mg by mouth daily.    . IRESSA 250 MG tablet TAKE 1 TABLET BY MOUTH ONCE DAILY 30 tablet 2  . Lapatinib Ditosylate (INVESTIGATIONAL LAPATINIB) 250 MG tablet ACORN GSK LPT [id number] Take 1 tablet by mouth daily.  2  . LUTEIN PO Take 1 capsule by mouth daily.     . Multiple Vitamins-Minerals (AIRBORNE) CHEW Chew 1 tablet by mouth daily.    . Multiple Vitamins-Minerals (HAIR/SKIN/NAILS) TABS Take 1 tablet by mouth daily.    . Omega-3 Fatty Acids (FISH OIL) 1200  MG CAPS Take 1,200 mg by mouth daily.    . ondansetron (ZOFRAN) 4 MG tablet Take 1 tablet (4 mg total) by mouth every 8 (eight) hours as needed for nausea or vomiting. 20 tablet 0  . Polyvinyl Alcohol-Povidone (REFRESH OP) Place 1 drop into both eyes as needed (for dry eyes).    . potassium chloride (K-DUR,KLOR-CON) 10 MEQ tablet Take 1 tablet (10 mEq total) by mouth daily. 30 tablet 6  . warfarin (COUMADIN) 5 MG tablet TAKE 1 AND 1/2 TABLETS BY MOUTH DAILY EXCEPT 1 TABLET ON MONDAYS, WEDNESDAYS AND FRIDAYS. (Patient taking differently: TAKE 7.5 MG BY MOUTH DAILY EXCEPT 5 MG ON MONDAYS, WEDNESDAYS AND FRIDAYS.) 45 tablet 6   No current facility-administered medications for this visit.   Allergies:  Doxycycline; Entocort ec; Neurontin; Vicodin; Adhesive; Amoxicillin; Chocolate; Codeine; Levaquin; Morphine and related; Naprosyn; Penicillins; Prednisone; Tramadol; and Uncoded nonscreenable allergen   Social History: The patient  reports that she has never smoked. She has never used smokeless tobacco. She reports that she does not drink alcohol or use illicit drugs.   ROS:  Please see the history of present illness. Otherwise, complete review of systems is positive for intermittent skin dryness, small erythematous spots on her lower legs. No fevers or chills. All other systems are reviewed and negative.   Physical Exam: VS:  BP 104/63 mmHg  Pulse 78  Ht 5' 3"  (1.6 m)  Wt 119 lb (53.978 kg)  BMI 21.09 kg/m2  SpO2 98%, BMI Body mass index is 21.09 kg/(m^2).  Wt Readings from Last 3 Encounters:  08/10/15 119 lb (53.978 kg)  08/04/15 117 lb 9.6 oz (53.343 kg)  07/22/15 117 lb (53.071 kg)    Pleasant elderly woman, appears comfortable at rest  HEENT: Conjunctiva and lids normal, oropharynx clear.  Neck: Supple, no elevated JVP with prominent external jugular. Lungs: Diminished but cleaar to auscultation, nonlabored breathing at rest.  Cardiac: Irregularly irregular, no S3 or significant  systolic murmur, no pericardial rub.  Abdomen: Soft, nontender, bowel sounds present.  Extremities: 1+ leg edema, distal pulses 2+.  Skin: Warm and dry. Maculopapular erythema on the legs distally. Musculoskeletal: No kyphosis. Neuropsychiatric: Alert and oriented 3, affect appropriate.  ECG: I personally reviewed the prior tracing from 07/22/2015 which showed rate-controlled atrial fibrillation with decreased R wave progression.  Recent Labwork: 08/04/2015: ALT 15; AST 26; BUN 14.1; Creatinine 0.9; HGB 14.1; Platelets 295; Potassium 4.7; Sodium 139   Other Studies Reviewed Today:  Echocardiogram September 2015: LVEF 60-65% with indeterminate diastolic function in the setting of atrial fibrillation/flutter, MAC with mild mitral regurgitation, mild to moderate  RV enlargement with normal contraction, mild to moderate tricuspid regurgitation with PASP 34 mmHg.  Carotid Dopplers 07/10/2014: 1-39% bilateral ICA stenoses.  Chest CT 06/30/2015: FINDINGS: Mediastinum/Nodes: Diffuse thyroid enlargement with multiple small hypo attenuating nodules, nonspecific. No supraclavicular adenopathy. Tortuous, atherosclerotic thoracic aorta. Cardiomegaly with significant biatrial enlargement. No pericardial effusion. No central pulmonary embolism, on this non-dedicated study.  Decreased pretracheal nodal size at 5 mm on image 60 is image 16/ series 2. Compare 8 mm on the prior.  Resolution of subcarinal adenopathy. No hilar adenopathy.  Lungs/Pleura: Trace right-sided pleural fluid persists.  Extensive pleural-based irregularity including along the right minor fissure. Improved. Example image 16/ series 4 today versus image 22 of the prior PET.  Pulmonary parenchymal nodules are also decreased. Example right lower lobe pulmonary nodule of 7 mm on image 26/series 4. Compare 8 mm on the prior PET (when remeasured).  Left upper lobe 4 mm nodule on image 28/series 4, decreased from 5 mm  on the prior PET.  Upper abdomen: Normal imaged portions of the liver, spleen, stomach, pancreas, adrenal glands. Punctate left renal collecting system calculus. Advanced abdominal aortic atherosclerosis.  Musculoskeletal: Convex left lumbar spine curvature.  IMPRESSION: 1. Response to therapy since the 03/11/15 PET. 2. Decrease in bilateral pulmonary parenchymal nodules and areas of right-sided pleural thickening/irregularity. 3. Improved to resolved thoracic adenopathy. 4. Trace right pleural fluid persists. 5. No new sites of disease.  Assessment and Plan:  1. Newly reported episode of rapid palpitations that occurred in the early a.m. hours. She describes only one event, no obvious recurrence. Reports no change in her medical regimen or missed doses. ECG today shows rate-controlled atrial fibrillation. For now we will adopt strategy of observation. If symptoms escalate, cardiac monitoring will be pursued.  2. Chronic atrial fibrillation. Continue Cardizem CD 180 mg daily as well as Coumadin.  3. Hyperlipidemia, managed by diet and omega-3 supplements. She has a history of statin intolerance. Follows with Dr. Eula Fried.  4. Leg edema, stable control on current dose of Lasix with potassium supplements. Recent lab work reviewed with normal potassium and creatinine.  Current medicines were reviewed with the patient today.   Orders Placed This Encounter  Procedures  . EKG 12-Lead    Disposition: FU with men 6 months.   Signed, Satira Sark, MD, [facility] 08/10/2015 8:14 AM    [facility] [place], [place], [place] [address] Phone: [phone]; Fax: [phone]

## 2015-08-12 ENCOUNTER — Encounter (INDEPENDENT_AMBULATORY_CARE_PROVIDER_SITE_OTHER): Payer: Self-pay | Admitting: Internal Medicine

## 2015-08-27 ENCOUNTER — Other Ambulatory Visit: Payer: Self-pay | Admitting: Cardiovascular Disease

## 2015-09-01 ENCOUNTER — Encounter: Payer: Self-pay | Admitting: Internal Medicine

## 2015-09-01 ENCOUNTER — Telehealth: Payer: Self-pay | Admitting: Internal Medicine

## 2015-09-01 ENCOUNTER — Other Ambulatory Visit (HOSPITAL_BASED_OUTPATIENT_CLINIC_OR_DEPARTMENT_OTHER): Payer: Medicare Other

## 2015-09-01 ENCOUNTER — Ambulatory Visit (HOSPITAL_BASED_OUTPATIENT_CLINIC_OR_DEPARTMENT_OTHER): Payer: Medicare Other | Admitting: Internal Medicine

## 2015-09-01 VITALS — BP 124/68 | HR 65 | Temp 97.4°F | Resp 17 | Ht 63.0 in | Wt 114.7 lb

## 2015-09-01 DIAGNOSIS — Z5181 Encounter for therapeutic drug level monitoring: Secondary | ICD-10-CM

## 2015-09-01 DIAGNOSIS — L270 Generalized skin eruption due to drugs and medicaments taken internally: Secondary | ICD-10-CM

## 2015-09-01 DIAGNOSIS — C3411 Malignant neoplasm of upper lobe, right bronchus or lung: Secondary | ICD-10-CM | POA: Diagnosis not present

## 2015-09-01 LAB — COMPREHENSIVE METABOLIC PANEL
ALT: 13 U/L (ref 0–55)
AST: 27 U/L (ref 5–34)
Albumin: 3.5 g/dL (ref 3.5–5.0)
Alkaline Phosphatase: 123 U/L (ref 40–150)
Anion Gap: 10 mEq/L (ref 3–11)
BUN: 12.4 mg/dL (ref 7.0–26.0)
CALCIUM: 9.1 mg/dL (ref 8.4–10.4)
CHLORIDE: 103 meq/L (ref 98–109)
CO2: 26 meq/L (ref 22–29)
Creatinine: 0.8 mg/dL (ref 0.6–1.1)
EGFR: 69 mL/min/{1.73_m2} — ABNORMAL LOW (ref >90)
Glucose: 96 mg/dl (ref 70–140)
POTASSIUM: 4 meq/L (ref 3.5–5.1)
SODIUM: 139 meq/L (ref 136–145)
Total Bilirubin: 0.73 mg/dL (ref 0.20–1.20)
Total Protein: 7.4 g/dL (ref 6.4–8.3)

## 2015-09-01 LAB — CBC WITH DIFFERENTIAL/PLATELET
BASO%: 1.1 % (ref 0.0–2.0)
BASOS ABS: 0 10*3/uL (ref 0.0–0.1)
EOS%: 4.9 % (ref 0.0–7.0)
Eosinophils Absolute: 0.2 10*3/uL (ref 0.0–0.5)
HEMATOCRIT: 43.4 % (ref 34.8–46.6)
HGB: 14.1 g/dL (ref 11.6–15.9)
LYMPH%: 25.2 % (ref 14.0–49.7)
MCH: 29 pg (ref 25.1–34.0)
MCHC: 32.5 g/dL (ref 31.5–36.0)
MCV: 89.2 fL (ref 79.5–101.0)
MONO#: 0.7 10*3/uL (ref 0.1–0.9)
MONO%: 16.6 % — AB (ref 0.0–14.0)
NEUT#: 2.1 10*3/uL (ref 1.5–6.5)
NEUT%: 52.2 % (ref 38.4–76.8)
Platelets: 281 10*3/uL (ref 145–400)
RBC: 4.86 10*6/uL (ref 3.70–5.45)
RDW: 14.6 % — ABNORMAL HIGH (ref 11.2–14.5)
WBC: 4 10*3/uL (ref 3.9–10.3)
lymph#: 1 10*3/uL (ref 0.9–3.3)

## 2015-09-01 NOTE — Progress Notes (Signed)
[facility] Telephone:(336) [phone]   Fax:(336) [phone]  OFFICE PROGRESS NOTE  [name], MD [address] [place] [address]  DIAGNOSIS: The current non-small cell lung cancer presented with bilateral lung nodules in October 2016, initially diagnosed stage IA non-small cell lung cancer, adenocarcinoma with positive EGFR mutation with deletion in exon 68 diagnosed in December 2012  PRIOR THERAPY: Status post bronchoscopy with right VATS and right upper lobectomy with lymph node dissection under the care of Dr. Servando Snare on 05/31/2011.  CURRENT THERAPY: Iressa 250 mg by mouth daily. First dose started 04/17/2015. Status post 5 months of treatment  INTERVAL HISTORY: Natalie Carlson 82 y.o. female returns to the clinic today for follow-up visit accompanied by her friend. The patient is feeling fine today with no specific complaints. She tolerated the last month her treatment with Iressa fairly well except for mild skin rash on the face and chest as well as dry skin. She also has some dry skin and dryness of her eyes. She has no significant diarrhea. She has no chest pain, shortness of breath, cough or hemoptysis. The patient denied having any nausea or vomiting. She has no fever or chills. She had repeat CBC and comprehensive metabolic panel performed earlier today and she is here for evaluation and discussion of her lab results.  MEDICAL HISTORY: Past Medical History  Diagnosis Date  . Hypothyroidism   . Cataract of both eyes   . Mucus in stool   . Chronic diarrhea   . Hyperlipidemia   . Colitis   . History of colon polyps   . History of migraines   . Arthritis   . Chronic back pain   . Hemorrhoids   . Macular degeneration, dry   . Atrial fibrillation ([place])   . Nephrolithiasis     1994 - passed on its on  . Malignant neoplasm of right upper lobe of lung (HCC)     Stage 1A  EGFR positive  Deletion in exon 19.  ALK-negative  Right Upper Lobe Lung Mass   SUV 2.9 on PET Status post VATS and right upper lobe lobectomy 05/31/2011    ALLERGIES:  is allergic to doxycycline; entocort ec; neurontin; vicodin; adhesive; amoxicillin; chocolate; codeine; levaquin; morphine and related; naprosyn; penicillins; prednisone; tramadol; and uncoded nonscreenable allergen.  MEDICATIONS:  Current Outpatient Prescriptions  Medication Sig Dispense Refill  . acetaminophen (TYLENOL) 325 MG tablet Take 650 mg by mouth every 6 (six) hours as needed for moderate pain. Reported on 06/02/2015    . ALPRAZolam (XANAX) 0.5 MG tablet Take 0.5 mg by mouth every 8 (eight) hours as needed for anxiety. Reported on 06/02/2015    . APRISO 0.375 G 24 hr capsule TAKE 2 CAPSULE BY MOUTH TWICE DAILY 120 capsule 6  . Cholecalciferol 2000 UNITS CAPS Take 2,000 Units by mouth daily.     . clindamycin (CLINDAGEL) 1 % gel Apply topically 2 (two) times daily. 30 g 0  . diclofenac sodium (VOLTAREN) 1 % GEL Apply 1 application topically 2 (two) times daily as needed (for neck pain).     Marland Kitchen diltiazem (CARDIZEM CD) 180 MG 24 hr capsule TAKE 1 CAPSULE BY MOUTH EVERY DAY (DOSE INCREASE) 30 capsule 6  . furosemide (LASIX) 20 MG tablet TAKE 20 MG BY MOUTH EVERY DAY AS NEEDED FOR FLUID 30 tablet 5  . gefitinib (IRESSA) 250 MG tablet Take 250 mg by mouth daily.    . IRESSA 250 MG tablet TAKE  1 TABLET BY MOUTH ONCE DAILY 30 tablet 2  . Lapatinib Ditosylate (INVESTIGATIONAL LAPATINIB) 250 MG tablet ACORN GSK LPT [id number] Take 1 tablet by mouth daily.  2  . LUTEIN PO Take 1 capsule by mouth daily.     . Multiple Vitamins-Minerals (AIRBORNE) CHEW Chew 1 tablet by mouth daily.    . Multiple Vitamins-Minerals (HAIR/SKIN/NAILS) TABS Take 1 tablet by mouth daily.    . Omega-3 Fatty Acids (FISH OIL) 1200 MG CAPS Take 1,200 mg by mouth daily.    . ondansetron (ZOFRAN) 4 MG tablet Take 1 tablet (4 mg total) by mouth every 8 (eight) hours as needed for nausea or vomiting. 20 tablet 0  . Polyvinyl Alcohol-Povidone  (REFRESH OP) Place 1 drop into both eyes as needed (for dry eyes).    . potassium chloride (K-DUR,KLOR-CON) 10 MEQ tablet Take 1 tablet (10 mEq total) by mouth daily. 30 tablet 6  . warfarin (COUMADIN) 5 MG tablet TAKE 1 AND 1/2 TABLETS BY MOUTH DAILY EXCEPT 1 TABLET ON MONDAYS, WEDNESDAYS AND FRIDAYS. (Patient taking differently: TAKE 7.5 MG BY MOUTH DAILY EXCEPT 5 MG ON MONDAYS, WEDNESDAYS AND FRIDAYS.) 45 tablet 6   No current facility-administered medications for this visit.    SURGICAL HISTORY:  Past Surgical History  Procedure Laterality Date  . Cataract extraction  2010/2011  . Bladder tack  1996  . Colonoscopy  3 YRS AGO  . Thyroid surgery  1961  . Colonoscopy  03/06/2011    Procedure: COLONOSCOPY;  Surgeon: Rogene Houston, MD;  [facility];  Service: Endoscopy;  Laterality: N/A;  7:30 am  . Tonsillectomy    . Esophagogastroduodenoscopy    . Cardioversion  11/02/09    x 2  . Video bronchoscopy  05/31/2011    Procedure: VIDEO BRONCHOSCOPY;  Surgeon: Grace Isaac, MD;  Location: [facility];  Service: Thoracic;  Laterality: N/A;  . Flexible sigmoidoscopy N/A 10/04/2012    Procedure: FLEXIBLE SIGMOIDOSCOPY;  Surgeon: Rogene Houston, MD;  [facility];  Service: Endoscopy;  Laterality: N/A;  11:00-moved to 1015 Ann to notify pt    REVIEW OF SYSTEMS:  A comprehensive review of systems was negative except for: Eyes: positive for Dryness of the eye Integument/breast: positive for dryness and rash   PHYSICAL EXAMINATION: General appearance: alert, cooperative, fatigued and no distress Head: Normocephalic, without obvious abnormality, atraumatic Neck: no adenopathy, no JVD, supple, symmetrical, trachea midline and thyroid not enlarged, symmetric, no tenderness/mass/nodules Lymph nodes: Cervical, supraclavicular, and axillary nodes normal. Resp: clear to auscultation bilaterally Back: symmetric, no curvature. ROM normal. No CVA tenderness. Cardio: regular rate  and rhythm, S1, S2 normal, no murmur, click, rub or gallop GI: soft, non-tender; bowel sounds normal; no masses,  no organomegaly Extremities: extremities normal, atraumatic, no cyanosis or edema Neurologic: Alert and oriented X 3, normal strength and tone. Normal symmetric reflexes. Normal coordination and gait  ECOG PERFORMANCE STATUS: 1 - Symptomatic but completely ambulatory  Blood pressure 124/68, pulse 65, temperature 97.4 F (36.3 C), resp. rate 17, height 5' 3"  (1.6 m), weight 114 lb 11.2 oz (52.028 kg), SpO2 98 %.  LABORATORY DATA: Lab Results  Component Value Date   WBC 4.0 09/01/2015   HGB 14.1 09/01/2015   HCT 43.4 09/01/2015   MCV 89.2 09/01/2015   PLT 281 09/01/2015      Chemistry      Component Value Date/Time   NA 139 09/01/2015 1057   NA 141 01/07/2014 1636   K  4.0 09/01/2015 1057   K 4.2 01/07/2014 1636   CL 102 01/07/2014 1636   CO2 26 09/01/2015 1057   CO2 29 01/07/2014 1636   BUN 12.4 09/01/2015 1057   BUN 14 01/07/2014 1636   CREATININE 0.8 09/01/2015 1057   CREATININE 0.70 03/10/2015 1706   CREATININE 0.64 07/17/2012 0950      Component Value Date/Time   CALCIUM 9.1 09/01/2015 1057   CALCIUM 9.1 01/07/2014 1636   ALKPHOS 123 09/01/2015 1057   ALKPHOS 58 06/02/2011 0400   AST 27 09/01/2015 1057   AST 28 06/02/2011 0400   ALT 13 09/01/2015 1057   ALT 17 06/02/2011 0400   BILITOT 0.73 09/01/2015 1057   BILITOT 0.5 06/02/2011 0400       RADIOGRAPHIC STUDIES: No results found.  ASSESSMENT AND PLAN: This is a very pleasant 82 years old white female with recurrent non-small cell lung cancer, adenocarcinoma with positive EGFR mutation with deletion in exon 74 diagnosed in October 2016 and presented with bilateral pulmonary nodules. The patient is currently on Iressa 250 mg by mouth daily status post 5 months of treatment and has been tolerating her treatment well except for mild skin rash and dry skin. I recommended for her to continue her  current treatment with Iressa 250 mg by mouth daily as a scheduled. For the dry skin she was advised to use lotion at regular basis. For the dryness of her eyes, I advised her to use isotonic eyedrops at regular basis. She will come back for follow-up visit in one month for evaluation with repeat CBC and comprehensive metabolic panel in addition to repeat CT scan of the chest for restaging of her disease. She was advised to call immediately if she has any concerning symptoms in the interval. The patient voices understanding of current disease status and treatment options and is in agreement with the current care plan.  All questions were answered. The patient knows to call the clinic with any problems, questions or concerns. We can certainly see the patient much sooner if necessary.  Disclaimer: This note was dictated with voice recognition software. Similar sounding words can inadvertently be transcribed and may not be corrected upon review.

## 2015-09-01 NOTE — Telephone Encounter (Signed)
Gave and printed appt sched and avs for pt for may

## 2015-09-07 ENCOUNTER — Ambulatory Visit (INDEPENDENT_AMBULATORY_CARE_PROVIDER_SITE_OTHER): Payer: Medicare Other | Admitting: *Deleted

## 2015-09-07 DIAGNOSIS — Z5181 Encounter for therapeutic drug level monitoring: Secondary | ICD-10-CM | POA: Diagnosis not present

## 2015-09-07 DIAGNOSIS — I4891 Unspecified atrial fibrillation: Secondary | ICD-10-CM

## 2015-09-07 LAB — POCT INR: INR: 3.6

## 2015-09-10 MED FILL — *IRESSA 250 MG TABLET: 250 | 30 days supply | Qty: 30 | Fill #1

## 2015-09-20 DIAGNOSIS — R070 Pain in throat: Secondary | ICD-10-CM | POA: Diagnosis not present

## 2015-09-20 DIAGNOSIS — J0300 Acute streptococcal tonsillitis, unspecified: Secondary | ICD-10-CM | POA: Diagnosis not present

## 2015-09-21 ENCOUNTER — Ambulatory Visit (INDEPENDENT_AMBULATORY_CARE_PROVIDER_SITE_OTHER): Payer: Medicare Other | Admitting: *Deleted

## 2015-09-21 DIAGNOSIS — Z5181 Encounter for therapeutic drug level monitoring: Secondary | ICD-10-CM | POA: Diagnosis not present

## 2015-09-21 DIAGNOSIS — I4891 Unspecified atrial fibrillation: Secondary | ICD-10-CM | POA: Diagnosis not present

## 2015-09-21 LAB — POCT INR: INR: 3

## 2015-09-22 ENCOUNTER — Ambulatory Visit (HOSPITAL_COMMUNITY)
Admission: RE | Admit: 2015-09-22 | Discharge: 2015-09-22 | Disposition: A | Discharge status: Home / Self Care | Payer: Medicare Other | Source: Ambulatory Visit | Attending: Internal Medicine | Admitting: Internal Medicine

## 2015-09-22 DIAGNOSIS — J90 Pleural effusion, not elsewhere classified: Secondary | ICD-10-CM | POA: Insufficient documentation

## 2015-09-22 DIAGNOSIS — Z5181 Encounter for therapeutic drug level monitoring: Secondary | ICD-10-CM | POA: Insufficient documentation

## 2015-09-22 DIAGNOSIS — L270 Generalized skin eruption due to drugs and medicaments taken internally: Secondary | ICD-10-CM | POA: Diagnosis not present

## 2015-09-22 DIAGNOSIS — C3411 Malignant neoplasm of upper lobe, right bronchus or lung: Secondary | ICD-10-CM | POA: Diagnosis not present

## 2015-09-22 MED ORDER — IOPAMIDOL (ISOVUE-300) INJECTION 61%
100.0000 mL | Freq: Once | INTRAVENOUS | Status: AC | PRN
  Administered 2015-09-22: 100 mL via INTRAVENOUS

## 2015-09-28 ENCOUNTER — Ambulatory Visit (INDEPENDENT_AMBULATORY_CARE_PROVIDER_SITE_OTHER): Payer: Medicare Other | Admitting: *Deleted

## 2015-09-28 DIAGNOSIS — Z5181 Encounter for therapeutic drug level monitoring: Secondary | ICD-10-CM | POA: Diagnosis not present

## 2015-09-28 DIAGNOSIS — Z79899 Other long term (current) drug therapy: Secondary | ICD-10-CM | POA: Diagnosis not present

## 2015-09-28 DIAGNOSIS — I4891 Unspecified atrial fibrillation: Secondary | ICD-10-CM

## 2015-09-28 LAB — POCT INR: INR: >8

## 2015-09-29 ENCOUNTER — Ambulatory Visit (HOSPITAL_BASED_OUTPATIENT_CLINIC_OR_DEPARTMENT_OTHER): Payer: Medicare Other | Admitting: Internal Medicine

## 2015-09-29 ENCOUNTER — Encounter: Payer: Self-pay | Admitting: Internal Medicine

## 2015-09-29 ENCOUNTER — Other Ambulatory Visit (HOSPITAL_BASED_OUTPATIENT_CLINIC_OR_DEPARTMENT_OTHER): Payer: Medicare Other

## 2015-09-29 ENCOUNTER — Telehealth: Payer: Self-pay | Admitting: Internal Medicine

## 2015-09-29 VITALS — BP 129/62 | HR 73 | Temp 98.0°F | Resp 18 | Ht 63.0 in | Wt 114.4 lb

## 2015-09-29 DIAGNOSIS — L270 Generalized skin eruption due to drugs and medicaments taken internally: Secondary | ICD-10-CM | POA: Diagnosis not present

## 2015-09-29 DIAGNOSIS — Z5181 Encounter for therapeutic drug level monitoring: Secondary | ICD-10-CM

## 2015-09-29 DIAGNOSIS — C3411 Malignant neoplasm of upper lobe, right bronchus or lung: Secondary | ICD-10-CM

## 2015-09-29 LAB — COMPREHENSIVE METABOLIC PANEL
ALBUMIN: 3.3 g/dL — AB (ref 3.5–5.0)
ALK PHOS: 120 U/L (ref 40–150)
ALT: 16 U/L (ref 0–55)
ANION GAP: 4 meq/L (ref 3–11)
AST: 24 U/L (ref 5–34)
BUN: 16.3 mg/dL (ref 7.0–26.0)
CALCIUM: 9 mg/dL (ref 8.4–10.4)
CHLORIDE: 105 meq/L (ref 98–109)
CO2: 29 mEq/L (ref 22–29)
CREATININE: 0.7 mg/dL (ref 0.6–1.1)
EGFR: 74 mL/min/{1.73_m2} — ABNORMAL LOW (ref >90)
Glucose: 95 mg/dl (ref 70–140)
POTASSIUM: 5.1 meq/L (ref 3.5–5.1)
Sodium: 138 mEq/L (ref 136–145)
Total Bilirubin: 0.72 mg/dL (ref 0.20–1.20)
Total Protein: 7 g/dL (ref 6.4–8.3)

## 2015-09-29 LAB — CBC WITH DIFFERENTIAL/PLATELET
BASO%: 0.2 % (ref 0.0–2.0)
BASOS ABS: 0 10*3/uL (ref 0.0–0.1)
EOS ABS: 0.1 10*3/uL (ref 0.0–0.5)
EOS%: 2.8 % (ref 0.0–7.0)
HEMATOCRIT: 41.6 % (ref 34.8–46.6)
HEMOGLOBIN: 13.8 g/dL (ref 11.6–15.9)
LYMPH#: 1.1 10*3/uL (ref 0.9–3.3)
LYMPH%: 23.2 % (ref 14.0–49.7)
MCH: 29.5 pg (ref 25.1–34.0)
MCHC: 33.2 g/dL (ref 31.5–36.0)
MCV: 88.9 fL (ref 79.5–101.0)
MONO#: 0.9 10*3/uL (ref 0.1–0.9)
MONO%: 20.2 % — ABNORMAL HIGH (ref 0.0–14.0)
NEUT#: 2.5 10*3/uL (ref 1.5–6.5)
NEUT%: 53.6 % (ref 38.4–76.8)
PLATELETS: 248 10*3/uL (ref 145–400)
RBC: 4.68 10*6/uL (ref 3.70–5.45)
RDW: 14.7 % — ABNORMAL HIGH (ref 11.2–14.5)
WBC: 4.7 10*3/uL (ref 3.9–10.3)

## 2015-09-29 NOTE — Progress Notes (Signed)
[facility] Telephone:(336) [phone]   Fax:(336) [phone]  OFFICE PROGRESS NOTE  [name], MD [address] [place] [address]  DIAGNOSIS: The current non-small cell lung cancer presented with bilateral lung nodules in October 2016, initially diagnosed stage IA non-small cell lung cancer, adenocarcinoma with positive EGFR mutation with deletion in exon 20 diagnosed in December 2012  PRIOR THERAPY: Status post bronchoscopy with right VATS and right upper lobectomy with lymph node dissection under the care of Dr. Servando Snare on 05/31/2011.  CURRENT THERAPY: Iressa 250 mg by mouth daily. First dose started 04/17/2015. Status post 6 months of treatment  INTERVAL HISTORY: Natalie Carlson 82 y.o. female returns to the clinic today for follow-up visit accompanied by her friend. The patient is feeling fine today with no specific complaints. She had oral thrush recently and was treated by her primary care physician with Diflucan with drug interaction with her Coumadin and her INR was more than 10. She discontinued Diflucan at her Coumadin is currently on hold. The patient received a dose of vitamin K by her primary care physician. She is otherwise feeling fine. She tolerated the last month her treatment with Iressa fairly well except for mild skin rash on the face and chest as well as dry skin. She has no significant diarrhea. She has no chest pain, shortness of breath, cough or hemoptysis. The patient denied having any nausea or vomiting. She has no fever or chills. She had repeat CT scan of the chest performed recently and she is here for evaluation and discussion of her scan results.  MEDICAL HISTORY: Past Medical History  Diagnosis Date  . Hypothyroidism   . Cataract of both eyes   . Mucus in stool   . Chronic diarrhea   . Hyperlipidemia   . Colitis   . History of colon polyps   . History of migraines   . Arthritis   . Chronic back pain   . Hemorrhoids   .  Macular degeneration, dry   . Atrial fibrillation ([place])   . Nephrolithiasis     1994 - passed on its on  . Malignant neoplasm of right upper lobe of lung (HCC)     Stage 1A  EGFR positive  Deletion in exon 19.  ALK-negative  Right Upper Lobe Lung Mass  SUV 2.9 on PET Status post VATS and right upper lobe lobectomy 05/31/2011    ALLERGIES:  is allergic to doxycycline; entocort ec; neurontin; vicodin; adhesive; amoxicillin; chocolate; codeine; levaquin; morphine and related; naprosyn; penicillins; prednisone; tramadol; and uncoded nonscreenable allergen.  MEDICATIONS:  Current Outpatient Prescriptions  Medication Sig Dispense Refill  . acetaminophen (TYLENOL) 325 MG tablet Take 650 mg by mouth every 6 (six) hours as needed for moderate pain. Reported on 06/02/2015    . ALPRAZolam (XANAX) 0.5 MG tablet Take 0.5 mg by mouth every 8 (eight) hours as needed for anxiety. Reported on 06/02/2015    . APRISO 0.375 G 24 hr capsule TAKE 2 CAPSULE BY MOUTH TWICE DAILY 120 capsule 6  . Cholecalciferol 2000 UNITS CAPS Take 2,000 Units by mouth daily.     . clindamycin (CLINDAGEL) 1 % gel Apply topically 2 (two) times daily. 30 g 0  . diclofenac sodium (VOLTAREN) 1 % GEL Apply 1 application topically 2 (two) times daily as needed (for neck pain).     Marland Kitchen diltiazem (CARDIZEM CD) 180 MG 24 hr capsule TAKE 1 CAPSULE BY MOUTH EVERY DAY (DOSE INCREASE) 30 capsule  6  . furosemide (LASIX) 20 MG tablet TAKE 20 MG BY MOUTH EVERY DAY AS NEEDED FOR FLUID 30 tablet 5  . gefitinib (IRESSA) 250 MG tablet Take 250 mg by mouth daily.    . IRESSA 250 MG tablet TAKE 1 TABLET BY MOUTH ONCE DAILY 30 tablet 2  . Lapatinib Ditosylate (INVESTIGATIONAL LAPATINIB) 250 MG tablet ACORN GSK LPT [id number] Take 1 tablet by mouth daily.  2  . Lapatinib Ditosylate (INVESTIGATIONAL LAPATINIB) 250 MG tablet ACORN GSK LPT [id number]   2  . LUTEIN PO Take 1 capsule by mouth daily.     . Multiple Vitamins-Minerals (AIRBORNE) CHEW Chew 1 tablet by mouth  daily.    . Multiple Vitamins-Minerals (HAIR/SKIN/NAILS) TABS Take 1 tablet by mouth daily.    . Omega-3 Fatty Acids (FISH OIL) 1200 MG CAPS Take 1,200 mg by mouth daily.    . ondansetron (ZOFRAN) 4 MG tablet Take 1 tablet (4 mg total) by mouth every 8 (eight) hours as needed for nausea or vomiting. 20 tablet 0  . Polyvinyl Alcohol-Povidone (REFRESH OP) Place 1 drop into both eyes as needed (for dry eyes).    . potassium chloride (K-DUR,KLOR-CON) 10 MEQ tablet Take 1 tablet (10 mEq total) by mouth daily. 30 tablet 6  . warfarin (COUMADIN) 5 MG tablet TAKE 1 AND 1/2 TABLETS BY MOUTH DAILY EXCEPT 1 TABLET ON MONDAYS, WEDNESDAYS AND FRIDAYS. (Patient taking differently: TAKE 7.5 MG BY MOUTH DAILY EXCEPT 5 MG ON MONDAYS, WEDNESDAYS AND FRIDAYS.) 45 tablet 6   No current facility-administered medications for this visit.    SURGICAL HISTORY:  Past Surgical History  Procedure Laterality Date  . Cataract extraction  2010/2011  . Bladder tack  1996  . Colonoscopy  3 YRS AGO  . Thyroid surgery  1961  . Colonoscopy  03/06/2011    Procedure: COLONOSCOPY;  Surgeon: Rogene Houston, MD;  [facility];  Service: Endoscopy;  Laterality: N/A;  7:30 am  . Tonsillectomy    . Esophagogastroduodenoscopy    . Cardioversion  11/02/09    x 2  . Video bronchoscopy  05/31/2011    Procedure: VIDEO BRONCHOSCOPY;  Surgeon: Grace Isaac, MD;  Location: [facility];  Service: Thoracic;  Laterality: N/A;  . Flexible sigmoidoscopy N/A 10/04/2012    Procedure: FLEXIBLE SIGMOIDOSCOPY;  Surgeon: Rogene Houston, MD;  [facility];  Service: Endoscopy;  Laterality: N/A;  11:00-moved to 1015 Ann to notify pt    REVIEW OF SYSTEMS:  Constitutional: negative Eyes: negative Ears, nose, mouth, throat, and face: negative Respiratory: negative Cardiovascular: negative Gastrointestinal: negative Genitourinary:negative Integument/breast: negative Hematologic/lymphatic:  negative Musculoskeletal:negative Neurological: negative Behavioral/Psych: negative Endocrine: negative Allergic/Immunologic: negative   PHYSICAL EXAMINATION: General appearance: alert, cooperative, fatigued and no distress Head: Normocephalic, without obvious abnormality, atraumatic Neck: no adenopathy, no JVD, supple, symmetrical, trachea midline and thyroid not enlarged, symmetric, no tenderness/mass/nodules Lymph nodes: Cervical, supraclavicular, and axillary nodes normal. Resp: clear to auscultation bilaterally Back: symmetric, no curvature. ROM normal. No CVA tenderness. Cardio: regular rate and rhythm, S1, S2 normal, no murmur, click, rub or gallop GI: soft, non-tender; bowel sounds normal; no masses,  no organomegaly Extremities: extremities normal, atraumatic, no cyanosis or edema Neurologic: Alert and oriented X 3, normal strength and tone. Normal symmetric reflexes. Normal coordination and gait  ECOG PERFORMANCE STATUS: 1 - Symptomatic but completely ambulatory  Blood pressure 129/62, pulse 73, temperature 98 F (36.7 C), temperature source Oral, resp. rate 18, height _0  (1.6 m), weight  114 lb 6.4 oz (51.891 kg), SpO2 99 %.  LABORATORY DATA: Lab Results  Component Value Date   WBC 4.7 09/29/2015   HGB 13.8 09/29/2015   HCT 41.6 09/29/2015   MCV 88.9 09/29/2015   PLT 248 09/29/2015      Chemistry      Component Value Date/Time   NA 139 09/01/2015 1057   NA 141 01/07/2014 1636   K 4.0 09/01/2015 1057   K 4.2 01/07/2014 1636   CL 102 01/07/2014 1636   CO2 26 09/01/2015 1057   CO2 29 01/07/2014 1636   BUN 12.4 09/01/2015 1057   BUN 14 01/07/2014 1636   CREATININE 0.8 09/01/2015 1057   CREATININE 0.70 03/10/2015 1706   CREATININE 0.64 07/17/2012 0950      Component Value Date/Time   CALCIUM 9.1 09/01/2015 1057   CALCIUM 9.1 01/07/2014 1636   ALKPHOS 123 09/01/2015 1057   ALKPHOS 58 06/02/2011 0400   AST 27 09/01/2015 1057   AST 28 06/02/2011 0400   ALT  13 09/01/2015 1057   ALT 17 06/02/2011 0400   BILITOT 0.73 09/01/2015 1057   BILITOT 0.5 06/02/2011 0400       RADIOGRAPHIC STUDIES: Ct Chest W Contrast  09/22/2015  CLINICAL DATA:  Right upper lobe lung cancer. EXAM: CT CHEST WITH CONTRAST TECHNIQUE: Multidetector CT imaging of the chest was performed during intravenous contrast administration. CONTRAST:  155m ISOVUE-300 IOPAMIDOL (ISOVUE-300) INJECTION 61% COMPARISON:  06/30/2015 FINDINGS: Mediastinum / Lymph Nodes: Stable appearance of the thyromegaly with bilateral thyroid nodules. 5 mm short axis pretracheal lymph node on the previous study is stable at 5 mm. 8 mm short axis subcarinal lymph node is unchanged. No hilar lymphadenopathy. Heart is enlarged. Coronary artery calcification is noted. No evidence pericardial effusion. The esophagus has normal imaging features. Lungs / Pleura: As seen before, there is pleural-based regularity seen along the fissures of the right lung. One of the dominant lesions today is a 5 x 16 mm irregular nodule along the cranial aspect of the major fissure (image 33 series 3). This lesion is stable in size although it is more conspicuous on today's study given the decreased volume-averaging artifact on today's study using thinner slice collimation. Right lower lobe pulmonary nodules are stable. 7 mm right lower lobe index nodule measured on the previous study measures 7 mm again today. Scattered pulmonary nodules in the left lung are unchanged. Underlying emphysema again noted. Upper Abdomen: There is abdominal aortic atherosclerosis without aneurysm. Otherwise unremarkable. MSK / Soft Tissues: Bone windows reveal no worrisome lytic or sclerotic osseous lesions. IMPRESSION: 1. Stable exam in the interval. The normal size mediastinal lymph nodes are unchanged. Scattered bilateral pulmonary nodules also show no interval change. No new or progressive findings in the thorax. 2. Stable trace right pleural effusion.  Electronically Signed   By: EMisty StanleyM.D.   On: 09/22/2015 12:29    ASSESSMENT AND PLAN: This is a very pleasant 82years old white female with recurrent non-small cell lung cancer, adenocarcinoma with positive EGFR mutation with deletion in exon 171diagnosed in October 2016 and presented with bilateral pulmonary nodules. The patient is currently on Iressa 250 mg by mouth daily status post 5 months of treatment and has been tolerating her treatment well except for mild skin rash and dry skin. The recent CT scan of the chest showed stable disease. I discussed the scan results with the patient today. I recommended for her to continue her current treatment with Iressa 250  mg by mouth daily as a scheduled. For the dry skin she would continue to use lotion at regular basis. For the dryness of her eyes, I advised her to use isotonic eyedrops at regular basis. Her Coumadin dose is currently adjusted by her primary care physician. She will come back for follow-up visit in one month for evaluation with repeat CBC and comprehensive metabolic panel. She was advised to call immediately if she has any concerning symptoms in the interval. The patient voices understanding of current disease status and treatment options and is in agreement with the current care plan.  All questions were answered. The patient knows to call the clinic with any problems, questions or concerns. We can certainly see the patient much sooner if necessary.  Disclaimer: This note was dictated with voice recognition software. Similar sounding words can inadvertently be transcribed and may not be corrected upon review.

## 2015-09-29 NOTE — Telephone Encounter (Signed)
Gave pt apt & avs

## 2015-09-30 ENCOUNTER — Ambulatory Visit (INDEPENDENT_AMBULATORY_CARE_PROVIDER_SITE_OTHER): Payer: Medicare Other | Admitting: *Deleted

## 2015-09-30 DIAGNOSIS — I4891 Unspecified atrial fibrillation: Secondary | ICD-10-CM | POA: Diagnosis not present

## 2015-09-30 DIAGNOSIS — Z5181 Encounter for therapeutic drug level monitoring: Secondary | ICD-10-CM

## 2015-09-30 LAB — POCT INR: INR: 5.2

## 2015-10-05 ENCOUNTER — Ambulatory Visit (INDEPENDENT_AMBULATORY_CARE_PROVIDER_SITE_OTHER): Payer: Medicare Other | Admitting: *Deleted

## 2015-10-05 DIAGNOSIS — Z5181 Encounter for therapeutic drug level monitoring: Secondary | ICD-10-CM

## 2015-10-05 DIAGNOSIS — I4891 Unspecified atrial fibrillation: Secondary | ICD-10-CM | POA: Diagnosis not present

## 2015-10-05 DIAGNOSIS — I480 Paroxysmal atrial fibrillation: Secondary | ICD-10-CM | POA: Diagnosis not present

## 2015-10-05 DIAGNOSIS — I10 Essential (primary) hypertension: Secondary | ICD-10-CM | POA: Diagnosis not present

## 2015-10-05 LAB — POCT INR: INR: 2.5

## 2015-10-12 MED FILL — *IRESSA 250 MG TABLET: 250 | 30 days supply | Qty: 30 | Fill #2

## 2015-10-14 ENCOUNTER — Telehealth: Payer: Self-pay | Admitting: Pharmacist

## 2015-10-14 NOTE — Telephone Encounter (Signed)
Left vm on pts home & mobile #s to call [facility] re: enrolling in [facility] assistance program for Iressa. She has lost financial assistance and no funds available for federal Medicare pts through [place] except through [place]. I will retry her tomorrow and enroll pt if she is interested. Kennith Center, Pharm.D., CPP 10/14/2015@4 :06 PM [facility]

## 2015-10-15 ENCOUNTER — Encounter: Payer: Self-pay | Admitting: Internal Medicine

## 2015-10-15 ENCOUNTER — Telehealth: Payer: Self-pay | Admitting: Pharmacist

## 2015-10-15 NOTE — Telephone Encounter (Signed)
I s/w pt re: [place] Program & she is interested in applying in addition to applying for the grant provided at [facility] (see note from [place] earlier on 10/15/15). Pt will come on 10/27/15 for lab & MD visit and she understands she needs to bring in the following for the [place] & Me application: 5883 Income Tax return & RX expense statement for 2017 from [place] (I've already obtained an expense statement for her Iressa from [facility]). Pt will need to sign application. Dr. Julien Nordmann needs to sign application. Kennith Center, Pharm.D., CPP 10/15/2015@1 :05 PM [facility]

## 2015-10-15 NOTE — Progress Notes (Signed)
Pt called needing assistance paying for Iressa.  I reached out to [place] to see if pt would be eligible to apply for the grant.  She gave approval.  I relayed that message to her so she will bring her bank statement on 10/28/15 to see if she qualifies for the grant.

## 2015-10-19 ENCOUNTER — Ambulatory Visit (INDEPENDENT_AMBULATORY_CARE_PROVIDER_SITE_OTHER): Payer: Medicare Other | Admitting: *Deleted

## 2015-10-19 DIAGNOSIS — I4891 Unspecified atrial fibrillation: Secondary | ICD-10-CM | POA: Diagnosis not present

## 2015-10-19 DIAGNOSIS — Z5181 Encounter for therapeutic drug level monitoring: Secondary | ICD-10-CM

## 2015-10-19 LAB — POCT INR: INR: 2.8

## 2015-10-27 ENCOUNTER — Encounter: Payer: Self-pay | Admitting: Internal Medicine

## 2015-10-27 ENCOUNTER — Other Ambulatory Visit (HOSPITAL_BASED_OUTPATIENT_CLINIC_OR_DEPARTMENT_OTHER): Payer: Medicare Other

## 2015-10-27 ENCOUNTER — Telehealth: Payer: Self-pay | Admitting: Internal Medicine

## 2015-10-27 ENCOUNTER — Ambulatory Visit (HOSPITAL_BASED_OUTPATIENT_CLINIC_OR_DEPARTMENT_OTHER): Payer: Medicare Other | Admitting: Internal Medicine

## 2015-10-27 VITALS — BP 114/56 | HR 76 | Temp 97.9°F | Resp 18 | Ht 63.0 in | Wt 113.7 lb

## 2015-10-27 DIAGNOSIS — Z5181 Encounter for therapeutic drug level monitoring: Secondary | ICD-10-CM

## 2015-10-27 DIAGNOSIS — C3411 Malignant neoplasm of upper lobe, right bronchus or lung: Secondary | ICD-10-CM | POA: Diagnosis not present

## 2015-10-27 DIAGNOSIS — L270 Generalized skin eruption due to drugs and medicaments taken internally: Secondary | ICD-10-CM | POA: Diagnosis not present

## 2015-10-27 LAB — COMPREHENSIVE METABOLIC PANEL
ALBUMIN: 3.6 g/dL (ref 3.5–5.0)
ALK PHOS: 124 U/L (ref 40–150)
ALT: 16 U/L (ref 0–55)
AST: 26 U/L (ref 5–34)
Anion Gap: 8 mEq/L (ref 3–11)
BUN: 13.4 mg/dL (ref 7.0–26.0)
CALCIUM: 9.4 mg/dL (ref 8.4–10.4)
CO2: 27 mEq/L (ref 22–29)
Chloride: 104 mEq/L (ref 98–109)
Creatinine: 0.8 mg/dL (ref 0.6–1.1)
EGFR: 70 mL/min/{1.73_m2} — AB (ref >90)
Glucose: 96 mg/dl (ref 70–140)
POTASSIUM: 4.9 meq/L (ref 3.5–5.1)
SODIUM: 138 meq/L (ref 136–145)
Total Bilirubin: 0.82 mg/dL (ref 0.20–1.20)
Total Protein: 7.4 g/dL (ref 6.4–8.3)

## 2015-10-27 LAB — CBC WITH DIFFERENTIAL/PLATELET
BASO%: 1 % (ref 0.0–2.0)
BASOS ABS: 0 10*3/uL (ref 0.0–0.1)
EOS ABS: 0.2 10*3/uL (ref 0.0–0.5)
EOS%: 3.9 % (ref 0.0–7.0)
HEMATOCRIT: 44.6 % (ref 34.8–46.6)
HEMOGLOBIN: 14.4 g/dL (ref 11.6–15.9)
LYMPH%: 24.8 % (ref 14.0–49.7)
MCH: 29.3 pg (ref 25.1–34.0)
MCHC: 32.4 g/dL (ref 31.5–36.0)
MCV: 90.3 fL (ref 79.5–101.0)
MONO#: 0.7 10*3/uL (ref 0.1–0.9)
MONO%: 15.8 % — AB (ref 0.0–14.0)
NEUT#: 2.4 10*3/uL (ref 1.5–6.5)
NEUT%: 54.5 % (ref 38.4–76.8)
Platelets: 260 10*3/uL (ref 145–400)
RBC: 4.93 10*6/uL (ref 3.70–5.45)
RDW: 14.4 % (ref 11.2–14.5)
WBC: 4.5 10*3/uL (ref 3.9–10.3)
lymph#: 1.1 10*3/uL (ref 0.9–3.3)

## 2015-10-27 NOTE — Telephone Encounter (Signed)
Gave and pritned appt sched and avs for pt for July

## 2015-10-27 NOTE — Progress Notes (Signed)
Pt is approved for the $400 [place].

## 2015-10-27 NOTE — Progress Notes (Signed)
[facility] [phone]   [phone]  OFFICE PROGRESS NOTE  [name], MD [address] [place] [address]  DIAGNOSIS: The current non-small cell lung cancer presented with bilateral lung nodules in October 2016, initially diagnosed stage IA non-small cell lung cancer, adenocarcinoma with positive EGFR mutation with deletion in exon 83 diagnosed in December 2012  PRIOR THERAPY: Status post bronchoscopy with right VATS and right upper lobectomy with lymph node dissection under the care of Dr. Servando Snare on 05/31/2011.  CURRENT THERAPY: Iressa 250 mg by mouth daily. First dose started 04/17/2015. Status post 7 months of treatment  INTERVAL HISTORY: Natalie Carlson 82 y.o. female returns to the clinic today for follow-up visit accompanied by her friend. The patient is feeling fine today with no specific complaints. She tolerated the last month her treatment with Iressa fairly well except for mild skin rash on the face and chest as well as dry skin and the scabs in her scalp. She has no significant diarrhea except for 3 times during the months. She has no chest pain, shortness of breath, cough or hemoptysis. The patient denied having any nausea or vomiting. She has no fever or chills. She had repeat CBC and compliance metabolic panel performed earlier today and she is here for evaluation and discussion of her lab results.  MEDICAL HISTORY: Past Medical History  Diagnosis Date  . Hypothyroidism   . Cataract of both eyes   . Mucus in stool   . Chronic diarrhea   . Hyperlipidemia   . Colitis   . History of colon polyps   . History of migraines   . Arthritis   . Chronic back pain   . Hemorrhoids   . Macular degeneration, dry   . Atrial fibrillation ([facility])   . Nephrolithiasis     1994 - passed on its on  . Malignant neoplasm of right upper lobe of lung (HCC)     Stage 1A  EGFR positive  Deletion in exon 19.  ALK-negative  Right Upper Lobe  Lung Mass  SUV 2.9 on PET Status post VATS and right upper lobe lobectomy 05/31/2011    ALLERGIES:  is allergic to doxycycline; entocort ec; neurontin; vicodin; adhesive; amoxicillin; chocolate; codeine; levaquin; morphine and related; naprosyn; penicillins; prednisone; tramadol; and uncoded nonscreenable allergen.  MEDICATIONS:  Current Outpatient Prescriptions  Medication Sig Dispense Refill  . acetaminophen (TYLENOL) 325 MG tablet Take 650 mg by mouth every 6 (six) hours as needed for moderate pain. Reported on 06/02/2015    . ALPRAZolam (XANAX) 0.5 MG tablet Take 0.5 mg by mouth every 8 (eight) hours as needed for anxiety. Reported on 06/02/2015    . APRISO 0.375 G 24 hr capsule TAKE 2 CAPSULE BY MOUTH TWICE DAILY 120 capsule 6  . Cholecalciferol 2000 UNITS CAPS Take 2,000 Units by mouth daily.     . clindamycin (CLINDAGEL) 1 % gel Apply topically 2 (two) times daily. 30 g 0  . diclofenac sodium (VOLTAREN) 1 % GEL Apply 1 application topically 2 (two) times daily as needed (for neck pain).     Marland Kitchen diltiazem (CARDIZEM CD) 180 MG 24 hr capsule TAKE 1 CAPSULE BY MOUTH EVERY DAY (DOSE INCREASE) 30 capsule 6  . furosemide (LASIX) 20 MG tablet TAKE 20 MG BY MOUTH EVERY DAY AS NEEDED FOR FLUID 30 tablet 5  . gefitinib (IRESSA) 250 MG tablet Take 250 mg by mouth daily.    . IRESSA 250 MG  tablet TAKE 1 TABLET BY MOUTH ONCE DAILY 30 tablet 2  . Lapatinib Ditosylate (INVESTIGATIONAL LAPATINIB) 250 MG tablet ACORN GSK LPT [id number] Take 1 tablet by mouth daily.  2  . Lapatinib Ditosylate (INVESTIGATIONAL LAPATINIB) 250 MG tablet ACORN GSK LPT [id number]   2  . LUTEIN PO Take 1 capsule by mouth daily.     . Multiple Vitamins-Minerals (AIRBORNE) CHEW Chew 1 tablet by mouth daily.    . Multiple Vitamins-Minerals (HAIR/SKIN/NAILS) TABS Take 1 tablet by mouth daily.    . Omega-3 Fatty Acids (FISH OIL) 1200 MG CAPS Take 1,200 mg by mouth daily.    . ondansetron (ZOFRAN) 4 MG tablet Take 1 tablet (4 mg total) by mouth  every 8 (eight) hours as needed for nausea or vomiting. 20 tablet 0  . Polyvinyl Alcohol-Povidone (REFRESH OP) Place 1 drop into both eyes as needed (for dry eyes).    . potassium chloride (K-DUR,KLOR-CON) 10 MEQ tablet Take 1 tablet (10 mEq total) by mouth daily. 30 tablet 6  . warfarin (COUMADIN) 5 MG tablet TAKE 1 AND 1/2 TABLETS BY MOUTH DAILY EXCEPT 1 TABLET ON MONDAYS, WEDNESDAYS AND FRIDAYS. (Patient taking differently: TAKE 7.5 MG BY MOUTH DAILY EXCEPT 5 MG ON MONDAYS, WEDNESDAYS AND FRIDAYS.) 45 tablet 6   No current facility-administered medications for this visit.    SURGICAL HISTORY:  Past Surgical History  Procedure Laterality Date  . Cataract extraction  2010/2011  . Bladder tack  1996  . Colonoscopy  3 YRS AGO  . Thyroid surgery  1961  . Colonoscopy  03/06/2011    Procedure: COLONOSCOPY;  Surgeon: Rogene Houston, MD;  [facility];  Service: Endoscopy;  Laterality: N/A;  7:30 am  . Tonsillectomy    . Esophagogastroduodenoscopy    . Cardioversion  11/02/09    x 2  . Video bronchoscopy  05/31/2011    Procedure: VIDEO BRONCHOSCOPY;  Surgeon: Grace Isaac, MD;  Location: [facility];  Service: Thoracic;  Laterality: N/A;  . Flexible sigmoidoscopy N/A 10/04/2012    Procedure: FLEXIBLE SIGMOIDOSCOPY;  Surgeon: Rogene Houston, MD;  [facility];  Service: Endoscopy;  Laterality: N/A;  11:00-moved to 1015 Ann to notify pt    REVIEW OF SYSTEMS:  A comprehensive review of systems was negative except for: Integument/breast: positive for dryness and rash   PHYSICAL EXAMINATION: General appearance: alert, cooperative, fatigued and no distress Head: Normocephalic, without obvious abnormality, atraumatic Neck: no adenopathy, no JVD, supple, symmetrical, trachea midline and thyroid not enlarged, symmetric, no tenderness/mass/nodules Lymph nodes: Cervical, supraclavicular, and axillary nodes normal. Resp: clear to auscultation bilaterally Back: symmetric, no  curvature. ROM normal. No CVA tenderness. Cardio: regular rate and rhythm, S1, S2 normal, no murmur, click, rub or gallop GI: soft, non-tender; bowel sounds normal; no masses,  no organomegaly Extremities: extremities normal, atraumatic, no cyanosis or edema Neurologic: Alert and oriented X 3, normal strength and tone. Normal symmetric reflexes. Normal coordination and gait  ECOG PERFORMANCE STATUS: 1 - Symptomatic but completely ambulatory  Blood pressure 114/56, pulse 76, temperature 97.9 F (36.6 C), temperature source Oral, resp. rate 18, height 5' 3"  (1.6 m), weight 113 lb 11.2 oz (51.574 kg), SpO2 97 %.  LABORATORY DATA: Lab Results  Component Value Date   WBC 4.5 10/27/2015   HGB 14.4 10/27/2015   HCT 44.6 10/27/2015   MCV 90.3 10/27/2015   PLT 260 10/27/2015      Chemistry      Component Value Date/Time  NA 138 09/29/2015 1155   NA 141 01/07/2014 1636   K 5.1 09/29/2015 1155   K 4.2 01/07/2014 1636   CL 102 01/07/2014 1636   CO2 29 09/29/2015 1155   CO2 29 01/07/2014 1636   BUN 16.3 09/29/2015 1155   BUN 14 01/07/2014 1636   CREATININE 0.7 09/29/2015 1155   CREATININE 0.70 03/10/2015 1706   CREATININE 0.64 07/17/2012 0950      Component Value Date/Time   CALCIUM 9.0 09/29/2015 1155   CALCIUM 9.1 01/07/2014 1636   ALKPHOS 120 09/29/2015 1155   ALKPHOS 58 06/02/2011 0400   AST 24 09/29/2015 1155   AST 28 06/02/2011 0400   ALT 16 09/29/2015 1155   ALT 17 06/02/2011 0400   BILITOT 0.72 09/29/2015 1155   BILITOT 0.5 06/02/2011 0400       RADIOGRAPHIC STUDIES: No results found.  ASSESSMENT AND PLAN: This is a very pleasant 82 years old white female with recurrent non-small cell lung cancer, adenocarcinoma with positive EGFR mutation with deletion in exon 23 diagnosed in October 2016 and presented with bilateral pulmonary nodules. The patient is currently on Iressa 250 mg by mouth daily status post 5 months of treatment and has been tolerating her treatment  well except for mild skin rash and dry skin and the scabs in her sclap. I recommended for her to continue her current treatment with Iressa 250 mg by mouth daily as a scheduled. For the dry skin she would continue to use lotion at regular basis. She was also advised to use dandruff shampoo for the scalp lesions For the dryness of her eyes, I advised her to use isotonic eyedrops at regular basis. She will come back for follow-up visit in one month for evaluation with repeat CBC and comprehensive metabolic panel. She was advised to call immediately if she has any concerning symptoms in the interval. The patient voices understanding of current disease status and treatment options and is in agreement with the current care plan.  All questions were answered. The patient knows to call the clinic with any problems, questions or concerns. We can certainly see the patient much sooner if necessary.  Disclaimer: This note was dictated with voice recognition software. Similar sounding words can inadvertently be transcribed and may not be corrected upon review.

## 2015-10-29 ENCOUNTER — Telehealth: Payer: Self-pay

## 2015-10-29 NOTE — Telephone Encounter (Signed)
I s/w [place] patient assistance program and was informed patient has been approved for medication assistance for Iressa 250 mg.  The medication assistance will continue thru May 14, 2016 at which point we will need to assess insurance and financial needs of the patient to re-enroll.  The Iressa will be shipped to the patients home address within 3-5 business days of this call.  I called and s/w patient and gave her the good news of patient assistance and to be aware she will be receiving a package containing her medication by the end of next week.  Also advised the patient if she has not received the package to give [place] a call.  I also s/w patient to see if she had any questions or concerns and was informed she has been on Iressa for quite a few months and that at the present she has no questions only excitement that she has received assistance.    Thank you,  Henreitta Leber, PharmD [facility]

## 2015-11-01 ENCOUNTER — Telehealth: Payer: Self-pay | Admitting: Pharmacist

## 2015-11-01 NOTE — Telephone Encounter (Addendum)
Received a request from [employer] for DUR. Completed requested info regarding allergies and current medications and faxed back.  Patient Program ID:  [id number] Order Number:  [id number]  Faxed to [phone]. Fax confirmation received.

## 2015-11-02 ENCOUNTER — Telehealth: Payer: Self-pay | Admitting: Medical Oncology

## 2015-11-02 NOTE — Telephone Encounter (Signed)
Chemotherapy coming from [facility] and me and med shipped 10/28/15.

## 2015-11-03 ENCOUNTER — Encounter (INDEPENDENT_AMBULATORY_CARE_PROVIDER_SITE_OTHER): Payer: Self-pay | Admitting: Internal Medicine

## 2015-11-03 ENCOUNTER — Ambulatory Visit (INDEPENDENT_AMBULATORY_CARE_PROVIDER_SITE_OTHER): Payer: Medicare Other | Admitting: Internal Medicine

## 2015-11-03 VITALS — BP 90/60 | HR 56 | Temp 98.7°F | Ht 63.0 in | Wt 112.4 lb

## 2015-11-03 DIAGNOSIS — K5090 Crohn's disease, unspecified, without complications: Secondary | ICD-10-CM | POA: Diagnosis not present

## 2015-11-03 NOTE — Patient Instructions (Signed)
Continue the Apriso.  OV in 1 year.

## 2015-11-03 NOTE — Progress Notes (Signed)
Subjective:    Patient ID: Natalie Carlson, female    DOB: December 25, 1929, 82 y.o.   MRN: [id number]  HPI Here today for f/u of her proctosigmoiditis felt to be secondary to  Crohn's disease. Maintained on Apriso 2 tabs BID. She tells me she is doing well as long as she takes the [employer]. She has a BM daily. She does have some diarrhea since starting the Iressa. If she has diarrhea, she will have 3 stools a day. Diarrhea varies. She may have 1-3 episodes a month. Keeps Imodium with her. Recently diagnosed with non small cell lung cancer in October of 2016. Maintained on Iressa. She follows Dr. Julien Nordmann at [facility]. Appetite is good. She has lost 4 1/2 pounds since her last visit in June of 2016.  Hx of lung cancer in 2012 and surgery in December 2012.   Hx of atrial fib and maintained of Coumadin.   CBC    Component Value Date/Time   WBC 4.5 10/27/2015 1153   WBC 3.8* 03/29/2015 1015   RBC 4.93 10/27/2015 1153   RBC 4.60 03/29/2015 1015   HGB 14.4 10/27/2015 1153   HGB 13.8 03/29/2015 1015   HCT 44.6 10/27/2015 1153   HCT 41.9 03/29/2015 1015   PLT 260 10/27/2015 1153   PLT 239 03/29/2015 1015   MCV 90.3 10/27/2015 1153   MCV 91.1 03/29/2015 1015   MCH 29.3 10/27/2015 1153   MCH 30.0 03/29/2015 1015   MCHC 32.4 10/27/2015 1153   MCHC 32.9 03/29/2015 1015   RDW 14.4 10/27/2015 1153   RDW 13.5 03/29/2015 1015   LYMPHSABS 1.1 10/27/2015 1153   LYMPHSABS 1.4 01/07/2014 1636   MONOABS 0.7 10/27/2015 1153   MONOABS 0.9 01/07/2014 1636   EOSABS 0.2 10/27/2015 1153   EOSABS 0.1 01/07/2014 1636   BASOSABS 0.0 10/27/2015 1153   BASOSABS 0.0 01/07/2014 1636    CMP Latest Ref Rng 10/27/2015 09/29/2015 09/01/2015  Glucose 70 - 140 mg/dl 96 95 96  BUN 7.0 - 26.0 mg/dL 13.4 16.3 12.4  Creatinine 0.6 - 1.1 mg/dL 0.8 0.7 0.8  Sodium 136 - 145 mEq/L 138 138 139  Potassium 3.5 - 5.1 mEq/L 4.9 5.1 4.0  CO2 22 - 29 mEq/L _0 Calcium 8.4 - 10.4 mg/dL 9.4 9.0 9.1    Total Protein 6.4 - 8.3 g/dL 7.4 7.0 7.4  Total Bilirubin 0.20 - 1.20 mg/dL 0.82 0.72 0.73  Alkaline Phos 40 - 150 U/L 124 120 123  AST 5 - 34 U/L _1 ALT 0 - 55 U/L _2 10/04/12 : Colonoscopy: Dr. Laural Golden: diarrhea and rectal discharge.  Impression:  Multiple erosions involving mucosa of sigmoid colon and rectum. Biopsy taken from these areas. Suspect we may be dealing with low-grade IBD.   She has changes of colitis but no diagnostic features.  Review of Systems Past Medical History  Diagnosis Date  . Hypothyroidism   . Cataract of both eyes   . Mucus in stool   . Chronic diarrhea   . Hyperlipidemia   . Colitis   . History of colon polyps   . History of migraines   . Arthritis   . Chronic back pain   . Hemorrhoids   . Macular degeneration, dry   . Atrial fibrillation ([place])   . Nephrolithiasis     1994 - passed on its on  . Malignant neoplasm of  right upper lobe of lung (HCC)     Stage 1A  EGFR positive  Deletion in exon 19.  ALK-negative  Right Upper Lobe Lung Mass  SUV 2.9 on PET Status post VATS and right upper lobe lobectomy 05/31/2011    Past Surgical History  Procedure Laterality Date  . Cataract extraction  2010/2011  . Bladder tack  1996  . Colonoscopy  3 YRS AGO  . Thyroid surgery  1961  . Colonoscopy  03/06/2011    Procedure: COLONOSCOPY;  Surgeon: Rogene Houston, MD;  [facility];  Service: Endoscopy;  Laterality: N/A;  7:30 am  . Tonsillectomy    . Esophagogastroduodenoscopy    . Cardioversion  11/02/09    x 2  . Video bronchoscopy  05/31/2011    Procedure: VIDEO BRONCHOSCOPY;  Surgeon: Grace Isaac, MD;  Location: [facility];  Service: Thoracic;  Laterality: N/A;  . Flexible sigmoidoscopy N/A 10/04/2012    Procedure: FLEXIBLE SIGMOIDOSCOPY;  Surgeon: Rogene Houston, MD;  [facility];  Service: Endoscopy;  Laterality: N/A;  11:00-moved to 1015 Ann to notify pt    Allergies  Allergen Reactions  .  Doxycycline Swelling and Other (See Comments)    SEVERE FACIAL SWELLING AND BURNING  . Entocort Ec [Budesonide] Other (See Comments)    Patient saw her PCP, Dr. Eula Fried and he made Dr. Laural Golden aware that he had d/c patient taking Entocort due to the side effects she was experiencing. SEVERE FACIAL BURNING  . Neurontin [Gabapentin] Swelling and Other (See Comments)    Per the patient she also has itching  . Vicodin [Hydrocodone-Acetaminophen] Rash and Other (See Comments)    Rash,Redness, and itching  . Adhesive [Tape] Other (See Comments)    Took skin off  . Amoxicillin Itching and Rash  . Chocolate Other (See Comments)    Causes migraines  . Codeine Nausea Only and Other (See Comments)    REACTION: nausea  . Levaquin [Levofloxacin In D5w] Itching and Rash  . Morphine And Related Nausea Only  . Naprosyn [Naproxen] Other (See Comments)    flush  . Penicillins Other (See Comments)    Childhood allergy  . Prednisone Itching  . Tramadol Itching  . Uncoded Nonscreenable Allergen Other (See Comments)    Raspberry - causes headaches    Current Outpatient Prescriptions on File Prior to Visit  Medication Sig Dispense Refill  . acetaminophen (TYLENOL) 325 MG tablet Take 650 mg by mouth every 6 (six) hours as needed for moderate pain. Reported on 06/02/2015    . ALPRAZolam (XANAX) 0.5 MG tablet Take 0.5 mg by mouth every 8 (eight) hours as needed for anxiety. Reported on 06/02/2015    . APRISO 0.375 G 24 hr capsule TAKE 2 CAPSULE BY MOUTH TWICE DAILY 120 capsule 6  . Cholecalciferol 2000 UNITS CAPS Take 2,000 Units by mouth daily.     . clindamycin (CLINDAGEL) 1 % gel Apply topically 2 (two) times daily. 30 g 0  . diclofenac sodium (VOLTAREN) 1 % GEL Apply 1 application topically 2 (two) times daily as needed (for neck pain).     Marland Kitchen diltiazem (CARDIZEM CD) 180 MG 24 hr capsule TAKE 1 CAPSULE BY MOUTH EVERY DAY (DOSE INCREASE) 30 capsule 6  . furosemide (LASIX) 20 MG tablet TAKE 20 MG BY MOUTH  EVERY DAY AS NEEDED FOR FLUID 30 tablet 5  . gefitinib (IRESSA) 250 MG tablet Take 250 mg by mouth daily.    . IRESSA 250 MG  tablet TAKE 1 TABLET BY MOUTH ONCE DAILY 30 tablet 2  . LUTEIN PO Take 1 capsule by mouth daily. Reported on 10/27/2015    . Multiple Vitamins-Minerals (AIRBORNE) CHEW Chew 1 tablet by mouth daily.    . Multiple Vitamins-Minerals (HAIR/SKIN/NAILS) TABS Take 1 tablet by mouth daily.    . Omega-3 Fatty Acids (FISH OIL) 1200 MG CAPS Take 1,200 mg by mouth daily.    . ondansetron (ZOFRAN) 4 MG tablet Take 1 tablet (4 mg total) by mouth every 8 (eight) hours as needed for nausea or vomiting. 20 tablet 0  . Polyvinyl Alcohol-Povidone (REFRESH OP) Place 1 drop into both eyes as needed (for dry eyes).    . potassium chloride (K-DUR,KLOR-CON) 10 MEQ tablet Take 1 tablet (10 mEq total) by mouth daily. 30 tablet 6  . warfarin (COUMADIN) 5 MG tablet TAKE 1 AND 1/2 TABLETS BY MOUTH DAILY EXCEPT 1 TABLET ON MONDAYS, WEDNESDAYS AND FRIDAYS. (Patient taking differently: TAKE 7.5 MG BY MOUTH DAILY EXCEPT 5 MG ON MONDAYS, WEDNESDAYS AND FRIDAYS.) 45 tablet 6   No current facility-administered medications on file prior to visit.        Objective:   Physical Exam Blood pressure 90/60, pulse 56, temperature 98.7 F (37.1 C), height _0  (1.6 m), weight 112 lb 6.4 oz (50.984 kg). Alert and oriented. Skin warm and dry. Oral mucosa is moist.   . Sclera anicteric, conjunctivae is pink. Thyroid not enlarged. No cervical lymphadenopathy. Lungs clear. Heart regular rate and rhythm.  Abdomen is soft. Bowel sounds are positive. No hepatomegaly. No abdominal masses felt. No tenderness.  No edema to lower extremities.         Assessment & Plan:  Crohn's colitis. She seems to be doing. She does occasionally has diarrhea. She does see mucous in her stools.  OV in 1 year. Continue the Apriso.

## 2015-11-18 ENCOUNTER — Telehealth: Payer: Self-pay

## 2015-11-18 ENCOUNTER — Ambulatory Visit (INDEPENDENT_AMBULATORY_CARE_PROVIDER_SITE_OTHER): Payer: Medicare Other | Admitting: *Deleted

## 2015-11-18 DIAGNOSIS — I4891 Unspecified atrial fibrillation: Secondary | ICD-10-CM | POA: Diagnosis not present

## 2015-11-18 DIAGNOSIS — Z5181 Encounter for therapeutic drug level monitoring: Secondary | ICD-10-CM

## 2015-11-18 LAB — POCT INR: INR: 2.1

## 2015-11-18 NOTE — Telephone Encounter (Signed)
Received a call from patients assistance program they are processing a refill for her Iressa.  Patient should receive delivery within 3 to 5 business days.  Prescription has 1 refill left that expires 05/14/16.  Will need a new prescription after the next refill.  Thank you  Henreitta Leber, PharmD [facility]

## 2015-11-25 ENCOUNTER — Other Ambulatory Visit (INDEPENDENT_AMBULATORY_CARE_PROVIDER_SITE_OTHER): Payer: Self-pay | Admitting: Internal Medicine

## 2015-11-25 ENCOUNTER — Other Ambulatory Visit: Payer: Self-pay | Admitting: Cardiology

## 2015-12-02 ENCOUNTER — Ambulatory Visit (HOSPITAL_BASED_OUTPATIENT_CLINIC_OR_DEPARTMENT_OTHER): Payer: Medicare Other | Admitting: Internal Medicine

## 2015-12-02 ENCOUNTER — Other Ambulatory Visit (HOSPITAL_BASED_OUTPATIENT_CLINIC_OR_DEPARTMENT_OTHER): Payer: Medicare Other

## 2015-12-02 ENCOUNTER — Encounter: Payer: Self-pay | Admitting: Internal Medicine

## 2015-12-02 ENCOUNTER — Telehealth: Payer: Self-pay | Admitting: Internal Medicine

## 2015-12-02 VITALS — BP 116/64 | HR 69 | Temp 98.0°F | Resp 16 | Ht 63.0 in | Wt 113.3 lb

## 2015-12-02 DIAGNOSIS — Z5181 Encounter for therapeutic drug level monitoring: Secondary | ICD-10-CM

## 2015-12-02 DIAGNOSIS — C3411 Malignant neoplasm of upper lobe, right bronchus or lung: Secondary | ICD-10-CM | POA: Diagnosis not present

## 2015-12-02 DIAGNOSIS — R21 Rash and other nonspecific skin eruption: Secondary | ICD-10-CM | POA: Diagnosis not present

## 2015-12-02 LAB — CBC WITH DIFFERENTIAL/PLATELET
BASO%: 0.7 % (ref 0.0–2.0)
Basophils Absolute: 0 10*3/uL (ref 0.0–0.1)
EOS%: 4.4 % (ref 0.0–7.0)
Eosinophils Absolute: 0.2 10*3/uL (ref 0.0–0.5)
HEMATOCRIT: 41.2 % (ref 34.8–46.6)
HGB: 13.7 g/dL (ref 11.6–15.9)
LYMPH#: 1.3 10*3/uL (ref 0.9–3.3)
LYMPH%: 29.9 % (ref 14.0–49.7)
MCH: 29.8 pg (ref 25.1–34.0)
MCHC: 33.3 g/dL (ref 31.5–36.0)
MCV: 89.6 fL (ref 79.5–101.0)
MONO#: 0.7 10*3/uL (ref 0.1–0.9)
MONO%: 16.6 % — ABNORMAL HIGH (ref 0.0–14.0)
NEUT%: 48.4 % (ref 38.4–76.8)
NEUTROS ABS: 2.1 10*3/uL (ref 1.5–6.5)
Platelets: 253 10*3/uL (ref 145–400)
RBC: 4.6 10*6/uL (ref 3.70–5.45)
RDW: 14.4 % (ref 11.2–14.5)
WBC: 4.4 10*3/uL (ref 3.9–10.3)

## 2015-12-02 LAB — COMPREHENSIVE METABOLIC PANEL
ALT: 16 U/L (ref 0–55)
AST: 27 U/L (ref 5–34)
Albumin: 3.6 g/dL (ref 3.5–5.0)
Alkaline Phosphatase: 112 U/L (ref 40–150)
Anion Gap: 8 mEq/L (ref 3–11)
BUN: 15.8 mg/dL (ref 7.0–26.0)
CALCIUM: 8.9 mg/dL (ref 8.4–10.4)
CHLORIDE: 104 meq/L (ref 98–109)
CO2: 28 meq/L (ref 22–29)
CREATININE: 0.8 mg/dL (ref 0.6–1.1)
EGFR: 71 mL/min/{1.73_m2} — ABNORMAL LOW (ref >90)
Glucose: 98 mg/dl (ref 70–140)
Potassium: 4.1 mEq/L (ref 3.5–5.1)
Sodium: 139 mEq/L (ref 136–145)
Total Bilirubin: 0.71 mg/dL (ref 0.20–1.20)
Total Protein: 7 g/dL (ref 6.4–8.3)

## 2015-12-02 NOTE — Telephone Encounter (Signed)
Pt confirmed appt and received avs

## 2015-12-02 NOTE — Progress Notes (Signed)
[place] [phone]   Fax:(336) [phone]  OFFICE PROGRESS NOTE  [name], MD [address] [place] [address]  DIAGNOSIS: The current non-small cell lung cancer presented with bilateral lung nodules in October 2016, initially diagnosed stage IA non-small cell lung cancer, adenocarcinoma with positive EGFR mutation with deletion in exon 29 diagnosed in December 2012  PRIOR THERAPY: Status post bronchoscopy with right VATS and right upper lobectomy with lymph node dissection under the care of Dr. Servando Snare on 05/31/2011.  CURRENT THERAPY: Iressa 250 mg by mouth daily. First dose started 04/17/2015. Status post 8 months of treatment  INTERVAL HISTORY: Natalie Carlson 82 y.o. female returns to the clinic today for follow-up visit accompanied by her friend. The patient is feeling fine today with no specific complaints. She tolerated the last month her treatment with Iressa fairly well except for mild skin rash on the face and and occasional diarrhea. This usually resolved with Imodium. Her weight has been stable. She has no chest pain, shortness of breath, cough or hemoptysis. The patient denied having any nausea or vomiting. She has no fever or chills. She had repeat CBC and compliance metabolic panel performed earlier today and she is here for evaluation and discussion of her lab results.  MEDICAL HISTORY: Past Medical History  Diagnosis Date  . Hypothyroidism   . Cataract of both eyes   . Mucus in stool   . Chronic diarrhea   . Hyperlipidemia   . Colitis   . History of colon polyps   . History of migraines   . Arthritis   . Chronic back pain   . Hemorrhoids   . Macular degeneration, dry   . Atrial fibrillation ([place])   . Nephrolithiasis     1994 - passed on its on  . Malignant neoplasm of right upper lobe of lung (HCC)     Stage 1A  EGFR positive  Deletion in exon 19.  ALK-negative  Right Upper Lobe Lung Mass  SUV 2.9 on PET Status post  VATS and right upper lobe lobectomy 05/31/2011    ALLERGIES:  is allergic to doxycycline; entocort ec; neurontin; vicodin; adhesive; amoxicillin; chocolate; codeine; levaquin; morphine and related; naprosyn; penicillins; prednisone; tramadol; and uncoded nonscreenable allergen.  MEDICATIONS:  Current Outpatient Prescriptions  Medication Sig Dispense Refill  . acetaminophen (TYLENOL) 325 MG tablet Take 650 mg by mouth every 6 (six) hours as needed for moderate pain. Reported on 06/02/2015    . ALPRAZolam (XANAX) 0.5 MG tablet Take 0.5 mg by mouth every 8 (eight) hours as needed for anxiety. Reported on 06/02/2015    . APRISO 0.375 g 24 hr capsule TAKE 2 CAPSULE BY MOUTH TWICE DAILY 120 capsule 6  . Cholecalciferol 2000 UNITS CAPS Take 2,000 Units by mouth daily.     . clindamycin (CLINDAGEL) 1 % gel Apply topically 2 (two) times daily. 30 g 0  . diclofenac sodium (VOLTAREN) 1 % GEL Apply 1 application topically 2 (two) times daily as needed (for neck pain).     Marland Kitchen diltiazem (CARDIZEM CD) 180 MG 24 hr capsule TAKE 1 CAPSULE BY MOUTH EVERY DAY (DOSE INCREASE) 30 capsule 6  . furosemide (LASIX) 20 MG tablet TAKE 20 MG BY MOUTH EVERY DAY AS NEEDED FOR FLUID 30 tablet 5  . gefitinib (IRESSA) 250 MG tablet Take 250 mg by mouth daily.    . IRESSA 250 MG tablet TAKE 1 TABLET BY MOUTH ONCE DAILY 30 tablet 2  .  LUTEIN PO Take 1 capsule by mouth daily. Reported on 10/27/2015    . Multiple Vitamins-Minerals (AIRBORNE) CHEW Chew 1 tablet by mouth daily.    . Multiple Vitamins-Minerals (HAIR/SKIN/NAILS) TABS Take 1 tablet by mouth daily.    . Omega-3 Fatty Acids (FISH OIL) 1200 MG CAPS Take 1,200 mg by mouth daily.    . ondansetron (ZOFRAN) 4 MG tablet Take 1 tablet (4 mg total) by mouth every 8 (eight) hours as needed for nausea or vomiting. 20 tablet 0  . Polyvinyl Alcohol-Povidone (REFRESH OP) Place 1 drop into both eyes as needed (for dry eyes).    . potassium chloride (K-DUR,KLOR-CON) 10 MEQ tablet Take 1  tablet (10 mEq total) by mouth daily. 30 tablet 6  . warfarin (COUMADIN) 5 MG tablet TAKE 1 AND 1/2 TABLETS BY MOUTH EVERY DAY EXCEPT ON MONDAYS, WEDNESDAYS, AND FRIDAYS; ON THOSE DAYS TAKE ONLY 1 TABLET 35 tablet 3   No current facility-administered medications for this visit.    SURGICAL HISTORY:  Past Surgical History  Procedure Laterality Date  . Cataract extraction  2010/2011  . Bladder tack  1996  . Colonoscopy  3 YRS AGO  . Thyroid surgery  1961  . Colonoscopy  03/06/2011    Procedure: COLONOSCOPY;  Surgeon: Rogene Houston, MD;  [facility];  Service: Endoscopy;  Laterality: N/A;  7:30 am  . Tonsillectomy    . Esophagogastroduodenoscopy    . Cardioversion  11/02/09    x 2  . Video bronchoscopy  05/31/2011    Procedure: VIDEO BRONCHOSCOPY;  Surgeon: Grace Isaac, MD;  Location: [facility];  Service: Thoracic;  Laterality: N/A;  . Flexible sigmoidoscopy N/A 10/04/2012    Procedure: FLEXIBLE SIGMOIDOSCOPY;  Surgeon: Rogene Houston, MD;  [facility];  Service: Endoscopy;  Laterality: N/A;  11:00-moved to 1015 Ann to notify pt    REVIEW OF SYSTEMS:  A comprehensive review of systems was negative except for: Integument/breast: positive for dryness and rash   PHYSICAL EXAMINATION: General appearance: alert, cooperative, fatigued and no distress Head: Normocephalic, without obvious abnormality, atraumatic Neck: no adenopathy, no JVD, supple, symmetrical, trachea midline and thyroid not enlarged, symmetric, no tenderness/mass/nodules Lymph nodes: Cervical, supraclavicular, and axillary nodes normal. Resp: clear to auscultation bilaterally Back: symmetric, no curvature. ROM normal. No CVA tenderness. Cardio: regular rate and rhythm, S1, S2 normal, no murmur, click, rub or gallop GI: soft, non-tender; bowel sounds normal; no masses,  no organomegaly Extremities: extremities normal, atraumatic, no cyanosis or edema Neurologic: Alert and oriented X 3, normal  strength and tone. Normal symmetric reflexes. Normal coordination and gait  ECOG PERFORMANCE STATUS: 1 - Symptomatic but completely ambulatory  Blood pressure 116/64, pulse 69, temperature 98 F (36.7 C), temperature source Oral, resp. rate 16, height 5' 3"  (1.6 m), weight 113 lb 4.8 oz (51.393 kg), SpO2 99 %.  LABORATORY DATA: Lab Results  Component Value Date   WBC 4.4 12/02/2015   HGB 13.7 12/02/2015   HCT 41.2 12/02/2015   MCV 89.6 12/02/2015   PLT 253 12/02/2015      Chemistry      Component Value Date/Time   NA 138 10/27/2015 1153   NA 141 01/07/2014 1636   K 4.9 10/27/2015 1153   K 4.2 01/07/2014 1636   CL 102 01/07/2014 1636   CO2 27 10/27/2015 1153   CO2 29 01/07/2014 1636   BUN 13.4 10/27/2015 1153   BUN 14 01/07/2014 1636   CREATININE 0.8 10/27/2015 1153  CREATININE 0.70 03/10/2015 1706   CREATININE 0.64 07/17/2012 0950      Component Value Date/Time   CALCIUM 9.4 10/27/2015 1153   CALCIUM 9.1 01/07/2014 1636   ALKPHOS 124 10/27/2015 1153   ALKPHOS 58 06/02/2011 0400   AST 26 10/27/2015 1153   AST 28 06/02/2011 0400   ALT 16 10/27/2015 1153   ALT 17 06/02/2011 0400   BILITOT 0.82 10/27/2015 1153   BILITOT 0.5 06/02/2011 0400       RADIOGRAPHIC STUDIES: No results found.  ASSESSMENT AND PLAN: This is a very pleasant 82 years old white female with recurrent non-small cell lung cancer, adenocarcinoma with positive EGFR mutation with deletion in exon 9 diagnosed in October 2016 and presented with bilateral pulmonary nodules. The patient is currently on Iressa 250 mg by mouth daily status post 8 months of treatment and has been tolerating her treatment well except for mild skin rash and occasional diarrhea. I recommended for her to continue her current treatment with Iressa 250 mg by mouth daily as a scheduled. For the dry skin she would continue to use lotion at regular basis. She was also advised to use dandruff shampoo for the scalp lesions She will  come back for follow-up visit in one month for evaluation with repeat CBC and comprehensive metabolic panel as well as CT scan of the chest for restaging of her disease. She was advised to call immediately if she has any concerning symptoms in the interval. The patient voices understanding of current disease status and treatment options and is in agreement with the current care plan.  All questions were answered. The patient knows to call the clinic with any problems, questions or concerns. We can certainly see the patient much sooner if necessary.  Disclaimer: This note was dictated with voice recognition software. Similar sounding words can inadvertently be transcribed and may not be corrected upon review.

## 2015-12-16 ENCOUNTER — Ambulatory Visit (INDEPENDENT_AMBULATORY_CARE_PROVIDER_SITE_OTHER): Payer: Medicare Other | Admitting: *Deleted

## 2015-12-16 DIAGNOSIS — Z5181 Encounter for therapeutic drug level monitoring: Secondary | ICD-10-CM

## 2015-12-16 DIAGNOSIS — I4891 Unspecified atrial fibrillation: Secondary | ICD-10-CM | POA: Diagnosis not present

## 2015-12-16 LAB — POCT INR: INR: 2.3

## 2015-12-21 ENCOUNTER — Telehealth: Payer: Self-pay | Admitting: Pharmacist

## 2015-12-21 NOTE — Telephone Encounter (Signed)
Oral Chemotherapy Pharmacist Encounter   Returned a call from [facility] regarding refill for Iressa. I initiated the refill. I was told this was the last fill on the current prescription and the patient would need a new prescription for the next fill. I called patient and alerted her that her refill would be arriving via [employer] in 3-5 business days and that she would have to sign for it. I sent Dr. Julien Nordmann an inbasket message about need for new Rx. Noted CT scan 8/21 and MD OV on 8/23. I will follow-up with MD on 8/22 to remind about need for new Rx.  Pt reports doing well on medication, no side effects, no missed doses.  Oral Chemo Clinic will continue to follow.  Johny Drilling, PharmD, BCPS Oral Chemotherapy Clinic

## 2016-01-03 ENCOUNTER — Ambulatory Visit (HOSPITAL_COMMUNITY)
Admission: RE | Admit: 2016-01-03 | Discharge: 2016-01-03 | Disposition: A | Discharge status: Home / Self Care | Payer: Medicare Other | Source: Ambulatory Visit | Attending: Internal Medicine | Admitting: Internal Medicine

## 2016-01-03 DIAGNOSIS — I2510 Atherosclerotic heart disease of native coronary artery without angina pectoris: Secondary | ICD-10-CM | POA: Diagnosis not present

## 2016-01-03 DIAGNOSIS — I700 Atherosclerosis of aorta: Secondary | ICD-10-CM | POA: Insufficient documentation

## 2016-01-03 DIAGNOSIS — C3411 Malignant neoplasm of upper lobe, right bronchus or lung: Secondary | ICD-10-CM | POA: Insufficient documentation

## 2016-01-03 DIAGNOSIS — C3491 Malignant neoplasm of unspecified part of right bronchus or lung: Secondary | ICD-10-CM | POA: Diagnosis not present

## 2016-01-03 DIAGNOSIS — Z5181 Encounter for therapeutic drug level monitoring: Secondary | ICD-10-CM

## 2016-01-03 MED ORDER — IOPAMIDOL (ISOVUE-300) INJECTION 61%
75.0000 mL | Freq: Once | INTRAVENOUS | Status: AC | PRN
  Administered 2016-01-03: 75 mL via INTRAVENOUS

## 2016-01-05 ENCOUNTER — Encounter: Payer: Self-pay | Admitting: Internal Medicine

## 2016-01-05 ENCOUNTER — Ambulatory Visit (HOSPITAL_BASED_OUTPATIENT_CLINIC_OR_DEPARTMENT_OTHER): Payer: Medicare Other | Admitting: Internal Medicine

## 2016-01-05 ENCOUNTER — Other Ambulatory Visit (HOSPITAL_BASED_OUTPATIENT_CLINIC_OR_DEPARTMENT_OTHER): Payer: Medicare Other

## 2016-01-05 ENCOUNTER — Telehealth: Payer: Self-pay | Admitting: Internal Medicine

## 2016-01-05 VITALS — BP 108/59 | HR 64 | Temp 97.8°F | Resp 17 | Ht 63.0 in | Wt 112.2 lb

## 2016-01-05 DIAGNOSIS — Z5181 Encounter for therapeutic drug level monitoring: Secondary | ICD-10-CM

## 2016-01-05 DIAGNOSIS — R197 Diarrhea, unspecified: Secondary | ICD-10-CM

## 2016-01-05 DIAGNOSIS — C3411 Malignant neoplasm of upper lobe, right bronchus or lung: Secondary | ICD-10-CM

## 2016-01-05 LAB — COMPREHENSIVE METABOLIC PANEL
ALT: 14 U/L (ref 0–55)
ANION GAP: 8 meq/L (ref 3–11)
AST: 26 U/L (ref 5–34)
Albumin: 3.4 g/dL — ABNORMAL LOW (ref 3.5–5.0)
Alkaline Phosphatase: 111 U/L (ref 40–150)
BUN: 16.5 mg/dL (ref 7.0–26.0)
CALCIUM: 9.1 mg/dL (ref 8.4–10.4)
CHLORIDE: 107 meq/L (ref 98–109)
CO2: 28 meq/L (ref 22–29)
Creatinine: 0.8 mg/dL (ref 0.6–1.1)
EGFR: 66 mL/min/{1.73_m2} — AB (ref >90)
Glucose: 128 mg/dl (ref 70–140)
POTASSIUM: 4 meq/L (ref 3.5–5.1)
Sodium: 142 mEq/L (ref 136–145)
Total Bilirubin: 0.73 mg/dL (ref 0.20–1.20)
Total Protein: 6.8 g/dL (ref 6.4–8.3)

## 2016-01-05 LAB — CBC WITH DIFFERENTIAL/PLATELET
BASO%: 0.2 % (ref 0.0–2.0)
BASOS ABS: 0 10*3/uL (ref 0.0–0.1)
EOS%: 2.6 % (ref 0.0–7.0)
Eosinophils Absolute: 0.1 10*3/uL (ref 0.0–0.5)
HEMATOCRIT: 42.1 % (ref 34.8–46.6)
HGB: 13.8 g/dL (ref 11.6–15.9)
LYMPH%: 26.9 % (ref 14.0–49.7)
MCH: 29.6 pg (ref 25.1–34.0)
MCHC: 32.8 g/dL (ref 31.5–36.0)
MCV: 90.1 fL (ref 79.5–101.0)
MONO#: 0.5 10*3/uL (ref 0.1–0.9)
MONO%: 11.6 % (ref 0.0–14.0)
NEUT#: 2.5 10*3/uL (ref 1.5–6.5)
NEUT%: 58.7 % (ref 38.4–76.8)
PLATELETS: 251 10*3/uL (ref 145–400)
RBC: 4.67 10*6/uL (ref 3.70–5.45)
RDW: 14.1 % (ref 11.2–14.5)
WBC: 4.2 10*3/uL (ref 3.9–10.3)
lymph#: 1.1 10*3/uL (ref 0.9–3.3)

## 2016-01-05 NOTE — Progress Notes (Signed)
[place] [phone]) [phone]   Fax:(336) [phone]  OFFICE PROGRESS NOTE  [name], MD [address] [place] [address]  DIAGNOSIS: The current non-small cell lung cancer presented with bilateral lung nodules in October 2016, initially diagnosed stage IA non-small cell lung cancer, adenocarcinoma with positive EGFR mutation with deletion in exon 62 diagnosed in December 2012  PRIOR THERAPY: Status post bronchoscopy with right VATS and right upper lobectomy with lymph node dissection under the care of Dr. Servando Snare on 05/31/2011.  CURRENT THERAPY: Iressa 250 mg by mouth daily. First dose started 04/17/2015. Status post 9 months of treatment  INTERVAL HISTORY: Natalie Carlson 82 y.o. female returns to the clinic today for follow-up visit accompanied by her friend. The patient is feeling fine today with no specific complaints. She tolerated the last month her treatment with Iressa fairly well except for occasional diarrhea, maximum 1-2 times daily. This usually resolved with Imodium. She lost 1 pound since her last visit. She has no chest pain, shortness of breath, cough or hemoptysis. The patient denied having any nausea or vomiting. She has no fever or chills. She had repeat CBC and comprehensive metabolic panel as well as CT scan of the chest performed recently and she is here for evaluation and discussion of her lab and scan results.  MEDICAL HISTORY: Past Medical History:  Diagnosis Date  . Arthritis   . Atrial fibrillation ([place])   . Cataract of both eyes   . Chronic back pain   . Chronic diarrhea   . Colitis   . Hemorrhoids   . History of colon polyps   . History of migraines   . Hyperlipidemia   . Hypothyroidism   . Macular degeneration, dry   . Malignant neoplasm of right upper lobe of lung (HCC)    Stage 1A  EGFR positive  Deletion in exon 19.  ALK-negative  Right Upper Lobe Lung Mass  SUV 2.9 on PET Status post VATS and right upper lobe  lobectomy 05/31/2011  . Mucus in stool   . Nephrolithiasis    1994 - passed on its on    ALLERGIES:  is allergic to doxycycline; entocort ec [budesonide]; neurontin [gabapentin]; vicodin [hydrocodone-acetaminophen]; adhesive [tape]; amoxicillin; chocolate; codeine; levaquin [levofloxacin in d5w]; morphine and related; naprosyn [naproxen]; penicillins; prednisone; tramadol; and uncoded nonscreenable allergen.  MEDICATIONS:  Current Outpatient Prescriptions  Medication Sig Dispense Refill  . acetaminophen (TYLENOL) 325 MG tablet Take 650 mg by mouth every 6 (six) hours as needed for moderate pain. Reported on 06/02/2015    . ALPRAZolam (XANAX) 0.5 MG tablet Take 0.5 mg by mouth every 8 (eight) hours as needed for anxiety. Reported on 06/02/2015    . APRISO 0.375 g 24 hr capsule TAKE 2 CAPSULE BY MOUTH TWICE DAILY 120 capsule 6  . Cholecalciferol 2000 UNITS CAPS Take 2,000 Units by mouth daily.     . clindamycin (CLINDAGEL) 1 % gel Apply topically 2 (two) times daily. 30 g 0  . diclofenac sodium (VOLTAREN) 1 % GEL Apply 1 application topically 2 (two) times daily as needed (for neck pain).     Marland Kitchen diltiazem (CARDIZEM CD) 180 MG 24 hr capsule TAKE 1 CAPSULE BY MOUTH EVERY DAY (DOSE INCREASE) 30 capsule 6  . furosemide (LASIX) 20 MG tablet TAKE 20 MG BY MOUTH EVERY DAY AS NEEDED FOR FLUID 30 tablet 5  . gefitinib (IRESSA) 250 MG tablet Take 250 mg by mouth daily.    Marland Kitchen  IRESSA 250 MG tablet TAKE 1 TABLET BY MOUTH ONCE DAILY 30 tablet 2  . Lapatinib Ditosylate (INVESTIGATIONAL LAPATINIB) 250 MG tablet [employer] GSK LPT [id number]   2  . LUTEIN PO Take 1 capsule by mouth daily. Reported on 10/27/2015    . MEPHYTON 5 MG tablet TAKE 1/2 TABLET NOW AND SAVE THE OTHER 1/2 TABLET FOR LATER  0  . Multiple Vitamins-Minerals (AIRBORNE) CHEW Chew 1 tablet by mouth daily.    . Multiple Vitamins-Minerals (HAIR/SKIN/NAILS) TABS Take 1 tablet by mouth daily.    . Omega-3 Fatty Acids (FISH OIL) 1200 MG CAPS Take 1,200 mg by  mouth daily.    . ondansetron (ZOFRAN) 4 MG tablet Take 1 tablet (4 mg total) by mouth every 8 (eight) hours as needed for nausea or vomiting. 20 tablet 0  . Polyvinyl Alcohol-Povidone (REFRESH OP) Place 1 drop into both eyes as needed (for dry eyes).    . potassium chloride (K-DUR) 10 MEQ tablet     . potassium chloride (K-DUR,KLOR-CON) 10 MEQ tablet Take 1 tablet (10 mEq total) by mouth daily. 30 tablet 6  . warfarin (COUMADIN) 5 MG tablet TAKE 1 AND 1/2 TABLETS BY MOUTH EVERY DAY EXCEPT ON MONDAYS, WEDNESDAYS, AND FRIDAYS; ON THOSE DAYS TAKE ONLY 1 TABLET (Patient taking differently: TAKE 1 BY MOUTH EVERY DAY) 35 tablet 3   No current facility-administered medications for this visit.     SURGICAL HISTORY:  Past Surgical History:  Procedure Laterality Date  . Bladder tack  1996  . CARDIOVERSION  11/02/09   x 2  . CATARACT EXTRACTION  2010/2011  . COLONOSCOPY  3 YRS AGO  . COLONOSCOPY  03/06/2011   Procedure: COLONOSCOPY;  Surgeon: Rogene Houston, MD;  [facility];  Service: Endoscopy;  Laterality: N/A;  7:30 am  . ESOPHAGOGASTRODUODENOSCOPY    . FLEXIBLE SIGMOIDOSCOPY N/A 10/04/2012   Procedure: FLEXIBLE SIGMOIDOSCOPY;  Surgeon: Rogene Houston, MD;  [facility];  Service: Endoscopy;  Laterality: N/A;  11:00-moved to 1015 Ann to notify pt  . THYROID SURGERY  1961  . TONSILLECTOMY    . VIDEO BRONCHOSCOPY  05/31/2011   Procedure: VIDEO BRONCHOSCOPY;  Surgeon: Grace Isaac, MD;  Location: [facility];  Service: Thoracic;  Laterality: N/A;    REVIEW OF SYSTEMS:  Constitutional: negative Eyes: negative Ears, nose, mouth, throat, and face: negative Respiratory: negative Cardiovascular: negative Gastrointestinal: positive for diarrhea Genitourinary:negative Integument/breast: positive for dryness Hematologic/lymphatic: negative Musculoskeletal:negative Neurological: negative Behavioral/Psych: negative Endocrine: negative Allergic/Immunologic: negative    PHYSICAL EXAMINATION: General appearance: alert, cooperative, fatigued and no distress Head: Normocephalic, without obvious abnormality, atraumatic Neck: no adenopathy, no JVD, supple, symmetrical, trachea midline and thyroid not enlarged, symmetric, no tenderness/mass/nodules Lymph nodes: Cervical, supraclavicular, and axillary nodes normal. Resp: clear to auscultation bilaterally Back: symmetric, no curvature. ROM normal. No CVA tenderness. Cardio: regular rate and rhythm, S1, S2 normal, no murmur, click, rub or gallop GI: soft, non-tender; bowel sounds normal; no masses,  no organomegaly Extremities: extremities normal, atraumatic, no cyanosis or edema Neurologic: Alert and oriented X 3, normal strength and tone. Normal symmetric reflexes. Normal coordination and gait  ECOG PERFORMANCE STATUS: 1 - Symptomatic but completely ambulatory  Blood pressure (!) 108/59, pulse 64, temperature 97.8 F (36.6 C), temperature source Oral, resp. rate 17, height 5' 3" (1.6 m), weight 112 lb 3.2 oz (50.9 kg), SpO2 97 %.  LABORATORY DATA: Lab Results  Component Value Date   WBC 4.2 01/05/2016   HGB 13.8  01/05/2016   HCT 42.1 01/05/2016   MCV 90.1 01/05/2016   PLT 251 01/05/2016      Chemistry      Component Value Date/Time   NA 142 01/05/2016 1257   K 4.0 01/05/2016 1257   CL 102 01/07/2014 1636   CO2 28 01/05/2016 1257   BUN 16.5 01/05/2016 1257   CREATININE 0.8 01/05/2016 1257      Component Value Date/Time   CALCIUM 9.1 01/05/2016 1257   ALKPHOS 111 01/05/2016 1257   AST 26 01/05/2016 1257   ALT 14 01/05/2016 1257   BILITOT 0.73 01/05/2016 1257       RADIOGRAPHIC STUDIES: Ct Chest W Contrast  Result Date: 01/03/2016 CLINICAL DATA:  Right lung cancer, chemotherapy pill. EXAM: CT CHEST WITH CONTRAST TECHNIQUE: Multidetector CT imaging of the chest was performed during intravenous contrast administration. CONTRAST:  82m ISOVUE-300 IOPAMIDOL (ISOVUE-300) INJECTION 61%  COMPARISON:  09/22/2015. FINDINGS: Cardiovascular: Ascending aorta measures up to 3.9 cm. Atherosclerotic calcification of the arterial vasculature. Coronary artery calcification. Heart is at the upper limits of normal in size. No pericardial effusion. Mediastinum/Nodes: Mediastinal lymph nodes are not enlarged by CT size criteria. No hilar or axillary lymph nodes. There is a fluid density structure in the right posterior supraclavicular fossa measuring approximate 4.5 cm in long axis, as before, likely a lymphangioma. Esophagus is grossly unremarkable. Lungs/Pleura: Mild biapical pleural parenchymal scarring. Subpleural/ fissural nodularity is seen throughout the right hemi thorax, as before. Mild septal thickening and subpleural reticulation, basilar predominant, also unchanged. Scattered nodular densities measure 4 mm or less in size, stable. Trace right pleural fluid. Airway is unremarkable. Upper Abdomen: Visualized portions of the liver, adrenal glands, left kidney, spleen, pancreas and stomach are grossly unremarkable. No upper abdominal adenopathy. Musculoskeletal: No worrisome lytic or sclerotic lesions. Degenerative changes are seen in the spine. IMPRESSION: 1. Subpleural and fissural nodularity in the right hemi thorax, basilar septal thickening and subpleural reticulation, unchanged. 2. Scattered tiny pulmonary nodules, stable. 3. Aortic atherosclerosis and coronary artery calcification. Electronically Signed   By: MLorin PicketM.D.   On: 01/03/2016 12:54    ASSESSMENT AND PLAN: This is a very pleasant 82years old white female with recurrent non-small cell lung cancer, adenocarcinoma with positive EGFR mutation with deletion in exon 144diagnosed in October 2016 and presented with bilateral pulmonary nodules. The patient is currently on Iressa 250 mg by mouth daily status post 9 months of treatment and has been tolerating her treatment well except for occasional diarrhea. The recent CT scan of  the chest showed no evidence for disease progression. I discussed the scan results with the patient today. I recommended for her to continue her current treatment with Iressa 250 mg by mouth daily as a scheduled. For the dry skin she would continue to use lotion at regular basis. She will also continue on Imodium for diarrhea. She will come back for follow-up visit in one month for evaluation with repeat CBC and comprehensive metabolic panel. She was advised to call immediately if she has any concerning symptoms in the interval. The patient voices understanding of current disease status and treatment options and is in agreement with the current care plan.  All questions were answered. The patient knows to call the clinic with any problems, questions or concerns. We can certainly see the patient much sooner if necessary.  Disclaimer: This note was dictated with voice recognition software. Similar sounding words can inadvertently be transcribed and may not be corrected upon review.

## 2016-01-05 NOTE — Telephone Encounter (Signed)
GAVE PATIENT AVS REPORT AND APPOINTMENTS FOR September.

## 2016-01-07 ENCOUNTER — Telehealth: Payer: Self-pay | Admitting: Pharmacist

## 2016-01-07 NOTE — Telephone Encounter (Signed)
Oral Chemotherapy Pharmacist Encounter   Received notification from [facility] that patient will need a new Rx for Iressa before they can provide next cycle to patient.  Spoke with Dr. Julien Nordmann on 8/24, patient to continue on Iressa 238m daily. New Rx form for Iressa completed and faxed to [facility].  Received notification from [facility] that they have received Rx, it will be filled today and should be delivered to patient on 01/10/16. They will call patient to alert her medication is on the way.  Oral Chemo Clinic will continue to follow.  JJohny Drilling PharmD, BCPS Oral Chemotherapy Clinic

## 2016-02-01 ENCOUNTER — Ambulatory Visit (INDEPENDENT_AMBULATORY_CARE_PROVIDER_SITE_OTHER): Payer: Medicare Other | Admitting: *Deleted

## 2016-02-01 DIAGNOSIS — I4891 Unspecified atrial fibrillation: Secondary | ICD-10-CM

## 2016-02-01 DIAGNOSIS — Z5181 Encounter for therapeutic drug level monitoring: Secondary | ICD-10-CM | POA: Diagnosis not present

## 2016-02-01 LAB — POCT INR: INR: 2.2

## 2016-02-02 ENCOUNTER — Telehealth: Payer: Self-pay | Admitting: Pharmacist

## 2016-02-02 NOTE — Telephone Encounter (Signed)
Oral Chemotherapy Pharmacist Encounter   I received call from [facility] inquiring about refill for patient's Iressa. Per Dr. Worthy Flank note 8/23, patient to continue on current dose of Iressa. Next fill initiated with [facility].  LVM for patient to inform her that medication should arrive in 3-5 business and she will have to sign for it.  Phone number for oral chemo clinic left on voicemail if patient has any questions or concerns. I did leave confirmation of 9/26 lab and MD appointments on voicemail as well.   Oral Chemo Clinic will continue to follow.  Johny Drilling, PharmD, BCPS 02/02/2016  2:32 PM Oral Chemotherapy Clinic [phone]

## 2016-02-08 ENCOUNTER — Ambulatory Visit (HOSPITAL_BASED_OUTPATIENT_CLINIC_OR_DEPARTMENT_OTHER): Payer: Medicare Other | Admitting: Internal Medicine

## 2016-02-08 ENCOUNTER — Other Ambulatory Visit (HOSPITAL_BASED_OUTPATIENT_CLINIC_OR_DEPARTMENT_OTHER): Payer: Medicare Other

## 2016-02-08 ENCOUNTER — Encounter: Payer: Self-pay | Admitting: Internal Medicine

## 2016-02-08 ENCOUNTER — Telehealth: Payer: Self-pay | Admitting: Internal Medicine

## 2016-02-08 VITALS — BP 118/60 | HR 58 | Temp 97.7°F | Resp 17 | Ht 63.0 in | Wt 112.1 lb

## 2016-02-08 DIAGNOSIS — C3411 Malignant neoplasm of upper lobe, right bronchus or lung: Secondary | ICD-10-CM

## 2016-02-08 DIAGNOSIS — Z5181 Encounter for therapeutic drug level monitoring: Secondary | ICD-10-CM

## 2016-02-08 LAB — CBC WITH DIFFERENTIAL/PLATELET
BASO%: 1 % (ref 0.0–2.0)
Basophils Absolute: 0 10*3/uL (ref 0.0–0.1)
EOS%: 3.8 % (ref 0.0–7.0)
Eosinophils Absolute: 0.1 10*3/uL (ref 0.0–0.5)
HEMATOCRIT: 44.8 % (ref 34.8–46.6)
HEMOGLOBIN: 14.4 g/dL (ref 11.6–15.9)
LYMPH#: 1.1 10*3/uL (ref 0.9–3.3)
LYMPH%: 29.6 % (ref 14.0–49.7)
MCH: 29.8 pg (ref 25.1–34.0)
MCHC: 32.2 g/dL (ref 31.5–36.0)
MCV: 92.3 fL (ref 79.5–101.0)
MONO#: 0.5 10*3/uL (ref 0.1–0.9)
MONO%: 14.4 % — AB (ref 0.0–14.0)
NEUT#: 1.9 10*3/uL (ref 1.5–6.5)
NEUT%: 51.2 % (ref 38.4–76.8)
Platelets: 271 10*3/uL (ref 145–400)
RBC: 4.85 10*6/uL (ref 3.70–5.45)
RDW: 14.1 % (ref 11.2–14.5)
WBC: 3.7 10*3/uL — ABNORMAL LOW (ref 3.9–10.3)

## 2016-02-08 LAB — COMPREHENSIVE METABOLIC PANEL
ALBUMIN: 3.4 g/dL — AB (ref 3.5–5.0)
ALK PHOS: 119 U/L (ref 40–150)
ALT: 15 U/L (ref 0–55)
AST: 25 U/L (ref 5–34)
Anion Gap: 9 mEq/L (ref 3–11)
BILIRUBIN TOTAL: 0.66 mg/dL (ref 0.20–1.20)
BUN: 15.9 mg/dL (ref 7.0–26.0)
CALCIUM: 8.9 mg/dL (ref 8.4–10.4)
CO2: 27 meq/L (ref 22–29)
Chloride: 104 mEq/L (ref 98–109)
Creatinine: 0.8 mg/dL (ref 0.6–1.1)
EGFR: 71 mL/min/{1.73_m2} — ABNORMAL LOW (ref >90)
Glucose: 107 mg/dl (ref 70–140)
POTASSIUM: 3.9 meq/L (ref 3.5–5.1)
SODIUM: 141 meq/L (ref 136–145)
Total Protein: 7 g/dL (ref 6.4–8.3)

## 2016-02-08 NOTE — Progress Notes (Signed)
[facility] [phone]   [phone]  OFFICE PROGRESS NOTE  [name], MD [address] [place] [address]  DIAGNOSIS: The current non-small cell lung cancer presented with bilateral lung nodules in October 2016, initially diagnosed stage IA non-small cell lung cancer, adenocarcinoma with positive EGFR mutation with deletion in exon 24 diagnosed in December 2012  PRIOR THERAPY: Status post bronchoscopy with right VATS and right upper lobectomy with lymph node dissection under the care of Dr. Servando Snare on 05/31/2011.  CURRENT THERAPY: Iressa 250 mg by mouth daily. First dose started 04/17/2015. Status post 10 months of treatment  INTERVAL HISTORY: Natalie Carlson 82 y.o. female returns to the clinic today for follow-up visit accompanied by her friend. The patient is feeling fine today with no specific complaints except for conjunctival hemorrhage on the left eye started few days ago. She is tolerating her treatment with Iressa fairly well except for occasional diarrhea improved with Imodium. She has no significant weight loss or night sweats. She has no chest pain, shortness of breath, cough or hemoptysis. The patient denied having any nausea or vomiting. She has no fever or chills. She had repeat CBC and comprehensive metabolic panel and she is here today for evaluation and discussion of her lab results.  MEDICAL HISTORY: Past Medical History:  Diagnosis Date  . Arthritis   . Atrial fibrillation ([place])   . Cataract of both eyes   . Chronic back pain   . Chronic diarrhea   . Colitis   . Hemorrhoids   . History of colon polyps   . History of migraines   . Hyperlipidemia   . Hypothyroidism   . Macular degeneration, dry   . Malignant neoplasm of right upper lobe of lung (HCC)    Stage 1A  EGFR positive  Deletion in exon 19.  ALK-negative  Right Upper Lobe Lung Mass  SUV 2.9 on PET Status post VATS and right upper lobe lobectomy 05/31/2011    . Mucus in stool   . Nephrolithiasis    1994 - passed on its on    ALLERGIES:  is allergic to doxycycline; entocort ec [budesonide]; neurontin [gabapentin]; vicodin [hydrocodone-acetaminophen]; adhesive [tape]; amoxicillin; chocolate; codeine; levaquin [levofloxacin in d5w]; morphine and related; naprosyn [naproxen]; penicillins; prednisone; tramadol; and uncoded nonscreenable allergen.  MEDICATIONS:  Current Outpatient Prescriptions  Medication Sig Dispense Refill  . acetaminophen (TYLENOL) 325 MG tablet Take 650 mg by mouth every 6 (six) hours as needed for moderate pain. Reported on 06/02/2015    . ALPRAZolam (XANAX) 0.5 MG tablet Take 0.5 mg by mouth every 8 (eight) hours as needed for anxiety. Reported on 06/02/2015    . APRISO 0.375 g 24 hr capsule TAKE 2 CAPSULE BY MOUTH TWICE DAILY 120 capsule 6  . Cholecalciferol 2000 UNITS CAPS Take 2,000 Units by mouth daily.     . clindamycin (CLINDAGEL) 1 % gel Apply topically 2 (two) times daily. 30 g 0  . diclofenac sodium (VOLTAREN) 1 % GEL Apply 1 application topically 2 (two) times daily as needed (for neck pain).     Marland Kitchen diltiazem (CARDIZEM CD) 180 MG 24 hr capsule TAKE 1 CAPSULE BY MOUTH EVERY DAY (DOSE INCREASE) 30 capsule 6  . furosemide (LASIX) 20 MG tablet TAKE 20 MG BY MOUTH EVERY DAY AS NEEDED FOR FLUID 30 tablet 5  . IRESSA 250 MG tablet TAKE 1 TABLET BY MOUTH ONCE DAILY 30 tablet 2  . LUTEIN PO Take 1  capsule by mouth daily. Reported on 10/27/2015    . MEPHYTON 5 MG tablet TAKE 1/2 TABLET NOW AND SAVE THE OTHER 1/2 TABLET FOR LATER  0  . Multiple Vitamins-Minerals (AIRBORNE) CHEW Chew 1 tablet by mouth daily.    . Multiple Vitamins-Minerals (HAIR/SKIN/NAILS) TABS Take 1 tablet by mouth daily.    . Omega-3 Fatty Acids (FISH OIL) 1200 MG CAPS Take 1,200 mg by mouth daily.    . ondansetron (ZOFRAN) 4 MG tablet Take 1 tablet (4 mg total) by mouth every 8 (eight) hours as needed for nausea or vomiting. 20 tablet 0  . Polyvinyl  Alcohol-Povidone (REFRESH OP) Place 1 drop into both eyes as needed (for dry eyes).    . potassium chloride (K-DUR,KLOR-CON) 10 MEQ tablet Take 1 tablet (10 mEq total) by mouth daily. 30 tablet 6  . warfarin (COUMADIN) 5 MG tablet TAKE 1 AND 1/2 TABLETS BY MOUTH EVERY DAY EXCEPT ON MONDAYS, WEDNESDAYS, AND FRIDAYS; ON THOSE DAYS TAKE ONLY 1 TABLET (Patient taking differently: TAKE 1 BY MOUTH EVERY DAY) 35 tablet 3   No current facility-administered medications for this visit.     SURGICAL HISTORY:  Past Surgical History:  Procedure Laterality Date  . Bladder tack  1996  . CARDIOVERSION  11/02/09   x 2  . CATARACT EXTRACTION  2010/2011  . COLONOSCOPY  3 YRS AGO  . COLONOSCOPY  03/06/2011   Procedure: COLONOSCOPY;  Surgeon: Rogene Houston, MD;  [facility];  Service: Endoscopy;  Laterality: N/A;  7:30 am  . ESOPHAGOGASTRODUODENOSCOPY    . FLEXIBLE SIGMOIDOSCOPY N/A 10/04/2012   Procedure: FLEXIBLE SIGMOIDOSCOPY;  Surgeon: Rogene Houston, MD;  Location: [facility];  Service: Endoscopy;  Laterality: N/A;  11:00-moved to 1015 Ann to notify pt  . THYROID SURGERY  1961  . TONSILLECTOMY    . VIDEO BRONCHOSCOPY  05/31/2011   Procedure: VIDEO BRONCHOSCOPY;  Surgeon: Grace Isaac, MD;  Location: [facility] OR;  Service: Thoracic;  Laterality: N/A;    REVIEW OF SYSTEMS:  A comprehensive review of systems was negative except for: Eyes: positive for redness Gastrointestinal: positive for diarrhea   PHYSICAL EXAMINATION: General appearance: alert, cooperative, fatigued and no distress Head: Normocephalic, without obvious abnormality, atraumatic Neck: no adenopathy, no JVD, supple, symmetrical, trachea midline and thyroid not enlarged, symmetric, no tenderness/mass/nodules Lymph nodes: Cervical, supraclavicular, and axillary nodes normal. Resp: clear to auscultation bilaterally Back: symmetric, no curvature. ROM normal. No CVA tenderness. Cardio: regular rate and rhythm, S1, S2 normal,  no murmur, click, rub or gallop GI: soft, non-tender; bowel sounds normal; no masses,  no organomegaly Extremities: extremities normal, atraumatic, no cyanosis or edema Neurologic: Alert and oriented X 3, normal strength and tone. Normal symmetric reflexes. Normal coordination and gait  ECOG PERFORMANCE STATUS: 1 - Symptomatic but completely ambulatory  Blood pressure 118/60, pulse (!) 58, temperature 97.7 F (36.5 C), temperature source Oral, resp. rate 17, height 5' 3"  (1.6 m), weight 112 lb 1.6 oz (50.8 kg), SpO2 98 %.  LABORATORY DATA: Lab Results  Component Value Date   WBC 3.7 (L) 02/08/2016   HGB 14.4 02/08/2016   HCT 44.8 02/08/2016   MCV 92.3 02/08/2016   PLT 271 02/08/2016      Chemistry      Component Value Date/Time   NA 141 02/08/2016 1318   K 3.9 02/08/2016 1318   CL 102 01/07/2014 1636   CO2 27 02/08/2016 1318   BUN 15.9 02/08/2016 1318   CREATININE 0.8  02/08/2016 1318      Component Value Date/Time   CALCIUM 8.9 02/08/2016 1318   ALKPHOS 119 02/08/2016 1318   AST 25 02/08/2016 1318   ALT 15 02/08/2016 1318   BILITOT 0.66 02/08/2016 1318       RADIOGRAPHIC STUDIES: No results found.  ASSESSMENT AND PLAN: This is a very pleasant 82 years old white female with recurrent non-small cell lung cancer, adenocarcinoma with positive EGFR mutation with deletion in exon 27 diagnosed in October 2016 and presented with bilateral pulmonary nodules. The patient is currently on Iressa 250 mg by mouth daily status post 10 months of treatment and has been tolerating her treatment well except for occasional diarrhea. I recommended for her to continue her current treatment with Iressa 250 mg by mouth daily as a scheduled. For the dry skin she would continue to use lotion at regular basis. She will also continue on Imodium for diarrhea. She will come back for follow-up visit in one month for evaluation with repeat CBC and comprehensive metabolic panel. She was advised to  call immediately if she has any concerning symptoms in the interval. The patient voices understanding of current disease status and treatment options and is in agreement with the current care plan.  All questions were answered. The patient knows to call the clinic with any problems, questions or concerns. We can certainly see the patient much sooner if necessary.  Disclaimer: This note was dictated with voice recognition software. Similar sounding words can inadvertently be transcribed and may not be corrected upon review.

## 2016-02-08 NOTE — Telephone Encounter (Signed)
Gave patient avs report and appointments for October  °

## 2016-02-23 DIAGNOSIS — H353131 Nonexudative age-related macular degeneration, bilateral, early dry stage: Secondary | ICD-10-CM | POA: Diagnosis not present

## 2016-02-23 DIAGNOSIS — H40003 Preglaucoma, unspecified, bilateral: Secondary | ICD-10-CM | POA: Diagnosis not present

## 2016-02-23 DIAGNOSIS — H04123 Dry eye syndrome of bilateral lacrimal glands: Secondary | ICD-10-CM | POA: Diagnosis not present

## 2016-02-23 DIAGNOSIS — H524 Presbyopia: Secondary | ICD-10-CM | POA: Diagnosis not present

## 2016-02-23 DIAGNOSIS — H01009 Unspecified blepharitis unspecified eye, unspecified eyelid: Secondary | ICD-10-CM | POA: Diagnosis not present

## 2016-02-26 DIAGNOSIS — Z23 Encounter for immunization: Secondary | ICD-10-CM | POA: Diagnosis not present

## 2016-03-03 ENCOUNTER — Telehealth: Payer: Self-pay | Admitting: *Deleted

## 2016-03-03 NOTE — Telephone Encounter (Signed)
Fax from [employer] pt;s Rx for Iressa 287m 1 PO daily order has been shipped to pt .

## 2016-03-13 ENCOUNTER — Telehealth: Payer: Self-pay | Admitting: Internal Medicine

## 2016-03-13 ENCOUNTER — Encounter: Payer: Self-pay | Admitting: Internal Medicine

## 2016-03-13 ENCOUNTER — Other Ambulatory Visit (HOSPITAL_BASED_OUTPATIENT_CLINIC_OR_DEPARTMENT_OTHER): Payer: Medicare Other

## 2016-03-13 ENCOUNTER — Ambulatory Visit (HOSPITAL_BASED_OUTPATIENT_CLINIC_OR_DEPARTMENT_OTHER): Payer: Medicare Other | Admitting: Internal Medicine

## 2016-03-13 VITALS — BP 127/71 | HR 72 | Temp 97.5°F | Resp 17 | Ht 63.0 in | Wt 114.5 lb

## 2016-03-13 DIAGNOSIS — C3411 Malignant neoplasm of upper lobe, right bronchus or lung: Secondary | ICD-10-CM | POA: Diagnosis not present

## 2016-03-13 DIAGNOSIS — R197 Diarrhea, unspecified: Secondary | ICD-10-CM

## 2016-03-13 DIAGNOSIS — Z5181 Encounter for therapeutic drug level monitoring: Secondary | ICD-10-CM

## 2016-03-13 LAB — COMPREHENSIVE METABOLIC PANEL
ALT: 17 U/L (ref 0–55)
AST: 27 U/L (ref 5–34)
Albumin: 3.4 g/dL — ABNORMAL LOW (ref 3.5–5.0)
Alkaline Phosphatase: 120 U/L (ref 40–150)
Anion Gap: 8 mEq/L (ref 3–11)
BILIRUBIN TOTAL: 0.66 mg/dL (ref 0.20–1.20)
BUN: 13.3 mg/dL (ref 7.0–26.0)
CALCIUM: 9 mg/dL (ref 8.4–10.4)
CHLORIDE: 106 meq/L (ref 98–109)
CO2: 29 meq/L (ref 22–29)
CREATININE: 0.8 mg/dL (ref 0.6–1.1)
EGFR: 71 mL/min/{1.73_m2} — ABNORMAL LOW (ref >90)
Glucose: 112 mg/dl (ref 70–140)
Potassium: 4.2 mEq/L (ref 3.5–5.1)
Sodium: 143 mEq/L (ref 136–145)
TOTAL PROTEIN: 6.9 g/dL (ref 6.4–8.3)

## 2016-03-13 LAB — CBC WITH DIFFERENTIAL/PLATELET
BASO%: 1.1 % (ref 0.0–2.0)
Basophils Absolute: 0 10*3/uL (ref 0.0–0.1)
EOS%: 5.1 % (ref 0.0–7.0)
Eosinophils Absolute: 0.2 10*3/uL (ref 0.0–0.5)
HEMATOCRIT: 44.6 % (ref 34.8–46.6)
HGB: 14.5 g/dL (ref 11.6–15.9)
LYMPH#: 1.2 10*3/uL (ref 0.9–3.3)
LYMPH%: 31.9 % (ref 14.0–49.7)
MCH: 29.9 pg (ref 25.1–34.0)
MCHC: 32.4 g/dL (ref 31.5–36.0)
MCV: 92.2 fL (ref 79.5–101.0)
MONO#: 0.5 10*3/uL (ref 0.1–0.9)
MONO%: 14.2 % — ABNORMAL HIGH (ref 0.0–14.0)
NEUT%: 47.7 % (ref 38.4–76.8)
NEUTROS ABS: 1.8 10*3/uL (ref 1.5–6.5)
PLATELETS: 264 10*3/uL (ref 145–400)
RBC: 4.84 10*6/uL (ref 3.70–5.45)
RDW: 14.2 % (ref 11.2–14.5)
WBC: 3.7 10*3/uL — AB (ref 3.9–10.3)

## 2016-03-13 NOTE — Progress Notes (Signed)
[facility] [phone]   [phone]) [phone]  OFFICE PROGRESS NOTE  [name], MD [address] [place] [address]  DIAGNOSIS: The current non-small cell lung cancer presented with bilateral lung nodules in October 2016, initially diagnosed stage IA non-small cell lung cancer, adenocarcinoma with positive EGFR mutation with deletion in exon 16 diagnosed in December 2012  PRIOR THERAPY: Status post bronchoscopy with right VATS and right upper lobectomy with lymph node dissection under the care of Dr. Servando Snare on 05/31/2011.  CURRENT THERAPY: Iressa 250 mg by mouth daily. First dose started 04/17/2015. Status post 11 months of treatment  INTERVAL HISTORY: Natalie Carlson 82 y.o. female returns to the clinic today for follow-up visit accompanied by her friend. The patient is feeling fine today with no specific complaints. She is tolerating her treatment with Iressa fairly well except for occasional diarrhea. She had a total of 5 episodes during last month. She has no significant weight loss or night sweats. She has no chest pain, shortness of breath, cough or hemoptysis. The patient denied having any nausea or vomiting. She has no fever or chills. She had repeat CBC and comprehensive metabolic panel and she is here today for evaluation and discussion of her lab results.  MEDICAL HISTORY: Past Medical History:  Diagnosis Date  . Arthritis   . Atrial fibrillation ([place])   . Cataract of both eyes   . Chronic back pain   . Chronic diarrhea   . Colitis   . Hemorrhoids   . History of colon polyps   . History of migraines   . Hyperlipidemia   . Hypothyroidism   . Macular degeneration, dry   . Malignant neoplasm of right upper lobe of lung (HCC)    Stage 1A  EGFR positive  Deletion in exon 19.  ALK-negative  Right Upper Lobe Lung Mass  SUV 2.9 on PET Status post VATS and right upper lobe lobectomy 05/31/2011  . Mucus in stool   . Nephrolithiasis    1994 - passed on its on    ALLERGIES:  is allergic to doxycycline; entocort ec [budesonide]; neurontin [gabapentin]; vicodin [hydrocodone-acetaminophen]; adhesive [tape]; amoxicillin; chocolate; codeine; levaquin [levofloxacin in d5w]; morphine and related; naprosyn [naproxen]; penicillins; prednisone; tramadol; and uncoded nonscreenable allergen.  MEDICATIONS:  Current Outpatient Prescriptions  Medication Sig Dispense Refill  . APRISO 0.375 g 24 hr capsule TAKE 2 CAPSULE BY MOUTH TWICE DAILY 120 capsule 6  . Cholecalciferol 2000 UNITS CAPS Take 2,000 Units by mouth daily.     Marland Kitchen diltiazem (CARDIZEM CD) 180 MG 24 hr capsule TAKE 1 CAPSULE BY MOUTH EVERY DAY (DOSE INCREASE) 30 capsule 6  . furosemide (LASIX) 20 MG tablet TAKE 20 MG BY MOUTH EVERY DAY AS NEEDED FOR FLUID 30 tablet 5  . IRESSA 250 MG tablet TAKE 1 TABLET BY MOUTH ONCE DAILY 30 tablet 2  . LUTEIN PO Take 1 capsule by mouth daily. Reported on 10/27/2015    . Multiple Vitamins-Minerals (AIRBORNE) CHEW Chew 1 tablet by mouth daily.    . Multiple Vitamins-Minerals (HAIR/SKIN/NAILS) TABS Take 1 tablet by mouth daily.    . Omega-3 Fatty Acids (FISH OIL) 1200 MG CAPS Take 1,200 mg by mouth daily.    . Polyvinyl Alcohol-Povidone (REFRESH OP) Place 1 drop into both eyes as needed (for dry eyes).    . potassium chloride (K-DUR,KLOR-CON) 10 MEQ tablet Take 1 tablet (10 mEq total) by mouth daily. 30 tablet 6  . warfarin (COUMADIN)  5 MG tablet TAKE 1 AND 1/2 TABLETS BY MOUTH EVERY DAY EXCEPT ON MONDAYS, WEDNESDAYS, AND FRIDAYS; ON THOSE DAYS TAKE ONLY 1 TABLET (Patient taking differently: TAKE 1 BY MOUTH EVERY DAY) 35 tablet 3  . acetaminophen (TYLENOL) 325 MG tablet Take 650 mg by mouth every 6 (six) hours as needed for moderate pain. Reported on 06/02/2015    . ALPRAZolam (XANAX) 0.5 MG tablet Take 0.5 mg by mouth every 8 (eight) hours as needed for anxiety. Reported on 06/02/2015    . clindamycin (CLINDAGEL) 1 % gel Apply topically 2 (two) times  daily. (Patient not taking: Reported on 03/13/2016) 30 g 0  . diclofenac sodium (VOLTAREN) 1 % GEL Apply 1 application topically 2 (two) times daily as needed (for neck pain).     . MEPHYTON 5 MG tablet TAKE 1/2 TABLET NOW AND SAVE THE OTHER 1/2 TABLET FOR LATER  0  . ondansetron (ZOFRAN) 4 MG tablet Take 1 tablet (4 mg total) by mouth every 8 (eight) hours as needed for nausea or vomiting. (Patient not taking: Reported on 03/13/2016) 20 tablet 0   No current facility-administered medications for this visit.     SURGICAL HISTORY:  Past Surgical History:  Procedure Laterality Date  . Bladder tack  1996  . CARDIOVERSION  11/02/09   x 2  . CATARACT EXTRACTION  2010/2011  . COLONOSCOPY  3 YRS AGO  . COLONOSCOPY  03/06/2011   Procedure: COLONOSCOPY;  Surgeon: Rogene Houston, MD;  [facility];  Service: Endoscopy;  Laterality: N/A;  7:30 am  . ESOPHAGOGASTRODUODENOSCOPY    . FLEXIBLE SIGMOIDOSCOPY N/A 10/04/2012   Procedure: FLEXIBLE SIGMOIDOSCOPY;  Surgeon: Rogene Houston, MD;  [facility];  Service: Endoscopy;  Laterality: N/A;  11:00-moved to 1015 Ann to notify pt  . THYROID SURGERY  1961  . TONSILLECTOMY    . VIDEO BRONCHOSCOPY  05/31/2011   Procedure: VIDEO BRONCHOSCOPY;  Surgeon: Grace Isaac, MD;  Location: [facility] OR;  Service: Thoracic;  Laterality: N/A;    REVIEW OF SYSTEMS:  A comprehensive review of systems was negative except for: Gastrointestinal: positive for diarrhea   PHYSICAL EXAMINATION: General appearance: alert, cooperative, fatigued and no distress Head: Normocephalic, without obvious abnormality, atraumatic Neck: no adenopathy, no JVD, supple, symmetrical, trachea midline and thyroid not enlarged, symmetric, no tenderness/mass/nodules Lymph nodes: Cervical, supraclavicular, and axillary nodes normal. Resp: clear to auscultation bilaterally Back: symmetric, no curvature. ROM normal. No CVA tenderness. Cardio: regular rate and rhythm, S1, S2  normal, no murmur, click, rub or gallop GI: soft, non-tender; bowel sounds normal; no masses,  no organomegaly Extremities: extremities normal, atraumatic, no cyanosis or edema Neurologic: Alert and oriented X 3, normal strength and tone. Normal symmetric reflexes. Normal coordination and gait  ECOG PERFORMANCE STATUS: 1 - Symptomatic but completely ambulatory  Blood pressure 127/71, pulse 72, temperature 97.5 F (36.4 C), temperature source Oral, resp. rate 17, height 5' 3"  (1.6 m), weight 114 lb 8 oz (51.9 kg), SpO2 98 %.  LABORATORY DATA: Lab Results  Component Value Date   WBC 3.7 (L) 03/13/2016   HGB 14.5 03/13/2016   HCT 44.6 03/13/2016   MCV 92.2 03/13/2016   PLT 264 03/13/2016      Chemistry      Component Value Date/Time   NA 141 02/08/2016 1318   K 3.9 02/08/2016 1318   CL 102 01/07/2014 1636   CO2 27 02/08/2016 1318   BUN 15.9 02/08/2016 1318   CREATININE 0.8  02/08/2016 1318      Component Value Date/Time   CALCIUM 8.9 02/08/2016 1318   ALKPHOS 119 02/08/2016 1318   AST 25 02/08/2016 1318   ALT 15 02/08/2016 1318   BILITOT 0.66 02/08/2016 1318       RADIOGRAPHIC STUDIES: No results found.  ASSESSMENT AND PLAN: This is a very pleasant 82 years old white female with recurrent non-small cell lung cancer, adenocarcinoma with positive EGFR mutation with deletion in exon 63 diagnosed in October 2016 and presented with bilateral pulmonary nodules. The patient is currently on Iressa 250 mg by mouth daily status post 11 months of treatment and has been tolerating her treatment well except for occasional diarrhea. I recommended for her to continue her current treatment with Iressa 250 mg by mouth daily as a scheduled. For the dry skin she would continue to use lotion at regular basis. She will also continue on Imodium for diarrhea. She will come back for follow-up visit in one month for evaluation with repeat CBC and comprehensive metabolic panel. She was advised to  call immediately if she has any concerning symptoms in the interval. The patient voices understanding of current disease status and treatment options and is in agreement with the current care plan.  All questions were answered. The patient knows to call the clinic with any problems, questions or concerns. We can certainly see the patient much sooner if necessary.  Disclaimer: This note was dictated with voice recognition software. Similar sounding words can inadvertently be transcribed and may not be corrected upon review.

## 2016-03-13 NOTE — Telephone Encounter (Signed)
Appointments scheduled per 10/30 LOS. Patient given AVS report and calendars of next scheduled appointments.

## 2016-03-14 ENCOUNTER — Ambulatory Visit (INDEPENDENT_AMBULATORY_CARE_PROVIDER_SITE_OTHER): Payer: Medicare Other | Admitting: *Deleted

## 2016-03-14 DIAGNOSIS — Z5181 Encounter for therapeutic drug level monitoring: Secondary | ICD-10-CM | POA: Diagnosis not present

## 2016-03-14 DIAGNOSIS — I4891 Unspecified atrial fibrillation: Secondary | ICD-10-CM

## 2016-03-14 LAB — POCT INR: INR: 1.9

## 2016-03-20 ENCOUNTER — Telehealth: Payer: Self-pay | Admitting: *Deleted

## 2016-03-20 NOTE — Telephone Encounter (Signed)
Fax received from [place] shipped to pt.

## 2016-03-24 ENCOUNTER — Other Ambulatory Visit: Payer: Self-pay | Admitting: Cardiology

## 2016-04-11 ENCOUNTER — Ambulatory Visit (INDEPENDENT_AMBULATORY_CARE_PROVIDER_SITE_OTHER): Payer: Medicare Other | Admitting: *Deleted

## 2016-04-11 DIAGNOSIS — I4891 Unspecified atrial fibrillation: Secondary | ICD-10-CM | POA: Diagnosis not present

## 2016-04-11 DIAGNOSIS — Z5181 Encounter for therapeutic drug level monitoring: Secondary | ICD-10-CM

## 2016-04-11 LAB — POCT INR: INR: 2.3

## 2016-04-12 ENCOUNTER — Other Ambulatory Visit (HOSPITAL_BASED_OUTPATIENT_CLINIC_OR_DEPARTMENT_OTHER): Payer: Medicare Other

## 2016-04-12 ENCOUNTER — Telehealth: Payer: Self-pay | Admitting: Internal Medicine

## 2016-04-12 ENCOUNTER — Encounter: Payer: Self-pay | Admitting: Internal Medicine

## 2016-04-12 ENCOUNTER — Ambulatory Visit (HOSPITAL_BASED_OUTPATIENT_CLINIC_OR_DEPARTMENT_OTHER): Payer: Medicare Other | Admitting: Internal Medicine

## 2016-04-12 VITALS — BP 128/64 | HR 78 | Temp 97.8°F | Resp 18 | Wt 113.2 lb

## 2016-04-12 DIAGNOSIS — C3411 Malignant neoplasm of upper lobe, right bronchus or lung: Secondary | ICD-10-CM | POA: Diagnosis not present

## 2016-04-12 DIAGNOSIS — R197 Diarrhea, unspecified: Secondary | ICD-10-CM | POA: Diagnosis not present

## 2016-04-12 DIAGNOSIS — Z5181 Encounter for therapeutic drug level monitoring: Secondary | ICD-10-CM

## 2016-04-12 LAB — CBC WITH DIFFERENTIAL/PLATELET
BASO%: 1 % (ref 0.0–2.0)
Basophils Absolute: 0 10*3/uL (ref 0.0–0.1)
EOS%: 4.6 % (ref 0.0–7.0)
Eosinophils Absolute: 0.2 10*3/uL (ref 0.0–0.5)
HCT: 42.5 % (ref 34.8–46.6)
HGB: 14 g/dL (ref 11.6–15.9)
LYMPH%: 27.6 % (ref 14.0–49.7)
MCH: 30.1 pg (ref 25.1–34.0)
MCHC: 33 g/dL (ref 31.5–36.0)
MCV: 91.4 fL (ref 79.5–101.0)
MONO#: 0.7 10*3/uL (ref 0.1–0.9)
MONO%: 18.3 % — ABNORMAL HIGH (ref 0.0–14.0)
NEUT#: 1.9 10*3/uL (ref 1.5–6.5)
NEUT%: 48.5 % (ref 38.4–76.8)
Platelets: 249 10*3/uL (ref 145–400)
RBC: 4.65 10*6/uL (ref 3.70–5.45)
RDW: 14 % (ref 11.2–14.5)
WBC: 4 10*3/uL (ref 3.9–10.3)
lymph#: 1.1 10*3/uL (ref 0.9–3.3)

## 2016-04-12 LAB — COMPREHENSIVE METABOLIC PANEL
ALK PHOS: 121 U/L (ref 40–150)
ALT: 18 U/L (ref 0–55)
AST: 30 U/L (ref 5–34)
Albumin: 3.4 g/dL — ABNORMAL LOW (ref 3.5–5.0)
Anion Gap: 7 mEq/L (ref 3–11)
BUN: 13.6 mg/dL (ref 7.0–26.0)
CO2: 28 meq/L (ref 22–29)
Calcium: 9.1 mg/dL (ref 8.4–10.4)
Chloride: 104 mEq/L (ref 98–109)
Creatinine: 0.7 mg/dL (ref 0.6–1.1)
EGFR: 74 mL/min/{1.73_m2} — AB (ref >90)
GLUCOSE: 97 mg/dL (ref 70–140)
POTASSIUM: 4.4 meq/L (ref 3.5–5.1)
SODIUM: 139 meq/L (ref 136–145)
Total Bilirubin: 0.64 mg/dL (ref 0.20–1.20)
Total Protein: 6.8 g/dL (ref 6.4–8.3)

## 2016-04-12 NOTE — Progress Notes (Signed)
[place] [phone]   [phone]  OFFICE PROGRESS NOTE  Natalie Mccreedy, MD [address] [place] [address]  DIAGNOSIS: The current non-small cell lung cancer presented with bilateral lung nodules in October 2016, initially diagnosed stage IA non-small cell lung cancer, adenocarcinoma with positive EGFR mutation with deletion in exon 17 diagnosed in December 2012  PRIOR THERAPY: Status post bronchoscopy with right VATS and right upper lobectomy with lymph node dissection under the care of Dr. Servando Snare on 05/31/2011.  CURRENT THERAPY: Iressa 250 mg by mouth daily. First dose started 04/17/2015. Status post 12 months of treatment  INTERVAL HISTORY: Natalie Carlson 82 y.o. female returns to the clinic today for follow-up visit accompanied by her friend. The patient has no complaints today. She is tolerating her treatment with Iressa fairly well except for 3 episodes of diarrhea the whole month since her last visit. She has no significant weight loss or night sweats. She has no chest pain, shortness of breath, cough or hemoptysis. The patient denied having any nausea or vomiting. She has no fever or chills. She had repeat CBC and comprehensive metabolic panel and she is here today for evaluation and discussion of her lab results.  MEDICAL HISTORY: Past Medical History:  Diagnosis Date  . Arthritis   . Atrial fibrillation ([place])   . Cataract of both eyes   . Chronic back pain   . Chronic diarrhea   . Colitis   . Hemorrhoids   . History of colon polyps   . History of migraines   . Hyperlipidemia   . Hypothyroidism   . Macular degeneration, dry   . Malignant neoplasm of right upper lobe of lung (HCC)    Stage 1A  EGFR positive  Deletion in exon 19.  ALK-negative  Right Upper Lobe Lung Mass  SUV 2.9 on PET Status post VATS and right upper lobe lobectomy 05/31/2011  . Mucus in stool   . Nephrolithiasis    1994 - passed on its on     ALLERGIES:  is allergic to doxycycline; entocort ec [budesonide]; neurontin [gabapentin]; vicodin [hydrocodone-acetaminophen]; adhesive [tape]; amoxicillin; chocolate; codeine; levaquin [levofloxacin in d5w]; morphine and related; naprosyn [naproxen]; penicillins; prednisone; tramadol; and uncoded nonscreenable allergen.  MEDICATIONS:  Current Outpatient Prescriptions  Medication Sig Dispense Refill  . acetaminophen (TYLENOL) 325 MG tablet Take 650 mg by mouth every 6 (six) hours as needed for moderate pain. Reported on 06/02/2015    . ALPRAZolam (XANAX) 0.5 MG tablet Take 0.5 mg by mouth every 8 (eight) hours as needed for anxiety. Reported on 06/02/2015    . APRISO 0.375 g 24 hr capsule TAKE 2 CAPSULE BY MOUTH TWICE DAILY 120 capsule 6  . Cholecalciferol 2000 UNITS CAPS Take 2,000 Units by mouth daily.     . clindamycin (CLINDAGEL) 1 % gel Apply topically 2 (two) times daily. (Patient not taking: Reported on 03/13/2016) 30 g 0  . diclofenac sodium (VOLTAREN) 1 % GEL Apply 1 application topically 2 (two) times daily as needed (for neck pain).     Marland Kitchen diltiazem (CARDIZEM CD) 180 MG 24 hr capsule TAKE 1 CAPSULE BY MOUTH EVERY DAY (DOSE INCREASE) 30 capsule 6  . furosemide (LASIX) 20 MG tablet TAKE 20 MG BY MOUTH EVERY DAY AS NEEDED FOR FLUID 30 tablet 5  . IRESSA 250 MG tablet TAKE 1 TABLET BY MOUTH ONCE DAILY 30 tablet 2  . LUTEIN PO Take 1 capsule by mouth daily. Reported  on 10/27/2015    . MEPHYTON 5 MG tablet TAKE 1/2 TABLET NOW AND SAVE THE OTHER 1/2 TABLET FOR LATER  0  . Multiple Vitamins-Minerals (AIRBORNE) CHEW Chew 1 tablet by mouth daily.    . Multiple Vitamins-Minerals (HAIR/SKIN/NAILS) TABS Take 1 tablet by mouth daily.    . Omega-3 Fatty Acids (FISH OIL) 1200 MG CAPS Take 1,200 mg by mouth daily.    . ondansetron (ZOFRAN) 4 MG tablet Take 1 tablet (4 mg total) by mouth every 8 (eight) hours as needed for nausea or vomiting. (Patient not taking: Reported on 03/13/2016) 20 tablet 0  .  Polyvinyl Alcohol-Povidone (REFRESH OP) Place 1 drop into both eyes as needed (for dry eyes).    . potassium chloride (K-DUR,KLOR-CON) 10 MEQ tablet Take 1 tablet (10 mEq total) by mouth daily. 30 tablet 6  . potassium chloride (K-DUR,KLOR-CON) 10 MEQ tablet TAKE 1 TABLET BY MOUTH EVERY DAY 30 tablet 6  . warfarin (COUMADIN) 5 MG tablet TAKE 1 AND 1/2 TABLETS BY MOUTH EVERY DAY EXCEPT ON MONDAYS, WEDNESDAYS, AND FRIDAYS; ON THOSE DAYS TAKE ONLY 1 TABLET 35 tablet 3   No current facility-administered medications for this visit.     SURGICAL HISTORY:  Past Surgical History:  Procedure Laterality Date  . Bladder tack  1996  . CARDIOVERSION  11/02/09   x 2  . CATARACT EXTRACTION  2010/2011  . COLONOSCOPY  3 YRS AGO  . COLONOSCOPY  03/06/2011   Procedure: COLONOSCOPY;  Surgeon: Rogene Houston, MD;  Location: [facility];  Service: Endoscopy;  Laterality: N/A;  7:30 am  . ESOPHAGOGASTRODUODENOSCOPY    . FLEXIBLE SIGMOIDOSCOPY N/A 10/04/2012   Procedure: FLEXIBLE SIGMOIDOSCOPY;  Surgeon: Rogene Houston, MD;  Location: [facility];  Service: Endoscopy;  Laterality: N/A;  11:00-moved to 1015 Ann to notify pt  . THYROID SURGERY  1961  . TONSILLECTOMY    . VIDEO BRONCHOSCOPY  05/31/2011   Procedure: VIDEO BRONCHOSCOPY;  Surgeon: Grace Isaac, MD;  Location: [facility] OR;  Service: Thoracic;  Laterality: N/A;    REVIEW OF SYSTEMS:  A comprehensive review of systems was negative except for: Gastrointestinal: positive for diarrhea   PHYSICAL EXAMINATION: General appearance: alert, cooperative, fatigued and no distress Head: Normocephalic, without obvious abnormality, atraumatic Neck: no adenopathy, no JVD, supple, symmetrical, trachea midline and thyroid not enlarged, symmetric, no tenderness/mass/nodules Lymph nodes: Cervical, supraclavicular, and axillary nodes normal. Resp: clear to auscultation bilaterally Back: symmetric, no curvature. ROM normal. No CVA tenderness. Cardio: regular rate  and rhythm, S1, S2 normal, no murmur, click, rub or gallop GI: soft, non-tender; bowel sounds normal; no masses,  no organomegaly Extremities: extremities normal, atraumatic, no cyanosis or edema Neurologic: Alert and oriented X 3, normal strength and tone. Normal symmetric reflexes. Normal coordination and gait  ECOG PERFORMANCE STATUS: 1 - Symptomatic but completely ambulatory  Blood pressure 128/64, pulse 78, temperature 97.8 F (36.6 C), temperature source Oral, resp. rate 18, weight 113 lb 3.2 oz (51.3 kg), SpO2 99 %.  LABORATORY DATA: Lab Results  Component Value Date   WBC 4.0 04/12/2016   HGB 14.0 04/12/2016   HCT 42.5 04/12/2016   MCV 91.4 04/12/2016   PLT 249 04/12/2016      Chemistry      Component Value Date/Time   NA 139 04/12/2016 1047   K 4.4 04/12/2016 1047   CL 102 01/07/2014 1636   CO2 28 04/12/2016 1047   BUN 13.6 04/12/2016 1047   CREATININE 0.7 04/12/2016  1047      Component Value Date/Time   CALCIUM 9.1 04/12/2016 1047   ALKPHOS 121 04/12/2016 1047   AST 30 04/12/2016 1047   ALT 18 04/12/2016 1047   BILITOT 0.64 04/12/2016 1047       RADIOGRAPHIC STUDIES: No results found.  ASSESSMENT AND PLAN: This is a very pleasant 82 years old white female with recurrent non-small cell lung cancer, adenocarcinoma with positive EGFR mutation with deletion in exon 19 diagnosed in October 2016 and presented with bilateral pulmonary nodules. The patient is currently on Iressa 250 mg by mouth daily status post 12 months of treatment and has been tolerating her treatment well except for occasional diarrhea. I recommended for her to continue her current treatment with Iressa 250 mg by mouth daily as a scheduled. For the dry skin she would continue to use lotion at regular basis. She will also continue on Imodium for diarrhea. She will come back for follow-up visit in one month for evaluation with repeat CBC and comprehensive metabolic panel as well as CT scan of the  chest. She was advised to call immediately if she has any concerning symptoms in the interval. The patient voices understanding of current disease status and treatment options and is in agreement with the current care plan.  All questions were answered. The patient knows to call the clinic with any problems, questions or concerns. We can certainly see the patient much sooner if necessary.  Disclaimer: This note was dictated with voice recognition software. Similar sounding words can inadvertently be transcribed and may not be corrected upon review.       

## 2016-04-12 NOTE — Telephone Encounter (Signed)
Gave patient avs report and appointments for January. Central radiology will call re scan to be performed at [facility].

## 2016-04-13 ENCOUNTER — Other Ambulatory Visit: Payer: Self-pay | Admitting: Medical Oncology

## 2016-04-13 ENCOUNTER — Telehealth: Payer: Self-pay | Admitting: Medical Oncology

## 2016-04-13 DIAGNOSIS — C3490 Malignant neoplasm of unspecified part of unspecified bronchus or lung: Secondary | ICD-10-CM

## 2016-04-13 MED ORDER — GEFITINIB 250 MG PO TABS: 250.0000 mg | ORAL_TABLET | Freq: Every day | ORAL | 2 refills | Status: DC

## 2016-04-13 NOTE — Telephone Encounter (Signed)
Faxed rx [facility]

## 2016-05-12 ENCOUNTER — Ambulatory Visit (HOSPITAL_COMMUNITY)
Admission: RE | Admit: 2016-05-12 | Discharge: 2016-05-12 | Disposition: A | Discharge status: Home / Self Care | Payer: Medicare Other | Source: Ambulatory Visit | Attending: Internal Medicine | Admitting: Internal Medicine

## 2016-05-12 DIAGNOSIS — Z9889 Other specified postprocedural states: Secondary | ICD-10-CM | POA: Diagnosis not present

## 2016-05-12 DIAGNOSIS — C3411 Malignant neoplasm of upper lobe, right bronchus or lung: Secondary | ICD-10-CM

## 2016-05-12 DIAGNOSIS — I700 Atherosclerosis of aorta: Secondary | ICD-10-CM | POA: Insufficient documentation

## 2016-05-12 DIAGNOSIS — Z5181 Encounter for therapeutic drug level monitoring: Secondary | ICD-10-CM

## 2016-05-12 MED ORDER — IOPAMIDOL (ISOVUE-300) INJECTION 61%
75.0000 mL | Freq: Once | INTRAVENOUS | Status: AC | PRN
  Administered 2016-05-12: 75 mL via INTRAVENOUS

## 2016-05-16 ENCOUNTER — Ambulatory Visit (INDEPENDENT_AMBULATORY_CARE_PROVIDER_SITE_OTHER): Payer: Medicare Other | Admitting: *Deleted

## 2016-05-16 DIAGNOSIS — Z5181 Encounter for therapeutic drug level monitoring: Secondary | ICD-10-CM

## 2016-05-16 DIAGNOSIS — I4891 Unspecified atrial fibrillation: Secondary | ICD-10-CM | POA: Diagnosis not present

## 2016-05-16 LAB — POCT INR: INR: 1.7

## 2016-05-18 ENCOUNTER — Telehealth: Payer: Self-pay | Admitting: Internal Medicine

## 2016-05-18 ENCOUNTER — Other Ambulatory Visit (HOSPITAL_BASED_OUTPATIENT_CLINIC_OR_DEPARTMENT_OTHER): Payer: Medicare Other

## 2016-05-18 ENCOUNTER — Ambulatory Visit (HOSPITAL_BASED_OUTPATIENT_CLINIC_OR_DEPARTMENT_OTHER): Payer: Medicare Other | Admitting: Internal Medicine

## 2016-05-18 ENCOUNTER — Encounter: Payer: Self-pay | Admitting: Internal Medicine

## 2016-05-18 VITALS — BP 133/81 | HR 74 | Temp 97.8°F | Resp 17 | Ht 63.0 in | Wt 113.4 lb

## 2016-05-18 DIAGNOSIS — I482 Chronic atrial fibrillation, unspecified: Secondary | ICD-10-CM

## 2016-05-18 DIAGNOSIS — L270 Generalized skin eruption due to drugs and medicaments taken internally: Secondary | ICD-10-CM

## 2016-05-18 DIAGNOSIS — C3411 Malignant neoplasm of upper lobe, right bronchus or lung: Secondary | ICD-10-CM

## 2016-05-18 DIAGNOSIS — R197 Diarrhea, unspecified: Secondary | ICD-10-CM

## 2016-05-18 DIAGNOSIS — Z5181 Encounter for therapeutic drug level monitoring: Secondary | ICD-10-CM

## 2016-05-18 LAB — CBC WITH DIFFERENTIAL/PLATELET
BASO%: 1 % (ref 0.0–2.0)
Basophils Absolute: 0 10*3/uL (ref 0.0–0.1)
EOS%: 5.2 % (ref 0.0–7.0)
Eosinophils Absolute: 0.2 10*3/uL (ref 0.0–0.5)
HCT: 41.9 % (ref 34.8–46.6)
HEMOGLOBIN: 14 g/dL (ref 11.6–15.9)
LYMPH%: 32.5 % (ref 14.0–49.7)
MCH: 30.3 pg (ref 25.1–34.0)
MCHC: 33.4 g/dL (ref 31.5–36.0)
MCV: 90.7 fL (ref 79.5–101.0)
MONO#: 0.6 10*3/uL (ref 0.1–0.9)
MONO%: 15.9 % — AB (ref 0.0–14.0)
NEUT%: 45.4 % (ref 38.4–76.8)
NEUTROS ABS: 1.8 10*3/uL (ref 1.5–6.5)
PLATELETS: 233 10*3/uL (ref 145–400)
RBC: 4.62 10*6/uL (ref 3.70–5.45)
RDW: 13.7 % (ref 11.2–14.5)
WBC: 4 10*3/uL (ref 3.9–10.3)
lymph#: 1.3 10*3/uL (ref 0.9–3.3)

## 2016-05-18 LAB — COMPREHENSIVE METABOLIC PANEL
ALBUMIN: 3.6 g/dL (ref 3.5–5.0)
ALT: 17 U/L (ref 0–55)
AST: 27 U/L (ref 5–34)
Alkaline Phosphatase: 117 U/L (ref 40–150)
Anion Gap: 9 mEq/L (ref 3–11)
BILIRUBIN TOTAL: 0.57 mg/dL (ref 0.20–1.20)
BUN: 15.3 mg/dL (ref 7.0–26.0)
CO2: 26 meq/L (ref 22–29)
CREATININE: 0.7 mg/dL (ref 0.6–1.1)
Calcium: 9.2 mg/dL (ref 8.4–10.4)
Chloride: 105 mEq/L (ref 98–109)
EGFR: 75 mL/min/{1.73_m2} — ABNORMAL LOW (ref >90)
GLUCOSE: 95 mg/dL (ref 70–140)
Potassium: 4.2 mEq/L (ref 3.5–5.1)
SODIUM: 139 meq/L (ref 136–145)
TOTAL PROTEIN: 6.7 g/dL (ref 6.4–8.3)

## 2016-05-18 NOTE — Telephone Encounter (Signed)
Appointments scheduled per 1/4 LOS. Patient given AVS report and calendars with future scheduled appointments.

## 2016-05-18 NOTE — Progress Notes (Signed)
[facility] [phone]   [phone]) [phone]  OFFICE PROGRESS NOTE  [name], MD [address] [place] [address]  DIAGNOSIS: Recurrent non-small cell lung cancer presented with bilateral lung nodules in October 2016 initially diagnosed as a stage IA adenocarcinoma with positive EGFR mutation with deletion in exon 52 in December 2012.  PRIOR THERAPY: Status post bronchoscopy with right VATS and right upper lobectomy with lymph node dissection under the care of Dr. Servando Snare on 05/31/2011.  CURRENT THERAPY: Iressa 250 mg by mouth daily started 04/17/2015, status post 13 months of treatment.  INTERVAL HISTORY: Natalie Carlson 83 y.o. female returns to the clinic today for follow-up visit accompanied by her friend. The patient is currently on treatment with Iressa 250 mg by mouth daily status post 13 months and tolerating her treatment well. She has one episode of diarrhea during the whole months. She continues to have mild skin rash all over her body. She denied having any itching. She has no significant weight loss or night sweat. She denied having any chest pain, shortness of breath, cough or hemoptysis. She has no nausea, vomiting, diarrhea or constipation. The patient has repeat CT scan of the chest performed recently and she is here for evaluation and discussion of her scan results.  MEDICAL HISTORY: Past Medical History:  Diagnosis Date  . Arthritis   . Atrial fibrillation ([place])   . Cataract of both eyes   . Chronic back pain   . Chronic diarrhea   . Colitis   . Hemorrhoids   . History of colon polyps   . History of migraines   . Hyperlipidemia   . Hypothyroidism   . Macular degeneration, dry   . Malignant neoplasm of right upper lobe of lung (HCC)    Stage 1A  EGFR positive  Deletion in exon 19.  ALK-negative  Right Upper Lobe Lung Mass  SUV 2.9 on PET Status post VATS and right upper lobe lobectomy 05/31/2011  . Mucus in stool   .  Nephrolithiasis    1994 - passed on its on    ALLERGIES:  is allergic to doxycycline; entocort ec [budesonide]; neurontin [gabapentin]; vicodin [hydrocodone-acetaminophen]; adhesive [tape]; amoxicillin; chocolate; codeine; levaquin [levofloxacin in d5w]; morphine and related; naprosyn [naproxen]; penicillins; prednisone; tramadol; and uncoded nonscreenable allergen.  MEDICATIONS:  Current Outpatient Prescriptions  Medication Sig Dispense Refill  . acetaminophen (TYLENOL) 325 MG tablet Take 650 mg by mouth every 6 (six) hours as needed for moderate pain. Reported on 06/02/2015    . ALPRAZolam (XANAX) 0.5 MG tablet Take 0.5 mg by mouth every 8 (eight) hours as needed for anxiety. Reported on 06/02/2015    . APRISO 0.375 g 24 hr capsule TAKE 2 CAPSULE BY MOUTH TWICE DAILY 120 capsule 6  . Cholecalciferol 2000 UNITS CAPS Take 2,000 Units by mouth daily.     . diclofenac sodium (VOLTAREN) 1 % GEL Apply 1 application topically 2 (two) times daily as needed (for neck pain).     Marland Kitchen diltiazem (CARDIZEM CD) 180 MG 24 hr capsule TAKE 1 CAPSULE BY MOUTH EVERY DAY (DOSE INCREASE) 30 capsule 6  . furosemide (LASIX) 20 MG tablet TAKE 20 MG BY MOUTH EVERY DAY AS NEEDED FOR FLUID 30 tablet 5  . gefitinib (IRESSA) 250 MG tablet Take 1 tablet (250 mg total) by mouth daily. 30 tablet 2  . LUTEIN PO Take 1 capsule by mouth daily. Reported on 10/27/2015    . MEPHYTON 5  MG tablet TAKE 1/2 TABLET NOW AND SAVE THE OTHER 1/2 TABLET FOR LATER  0  . Multiple Vitamins-Minerals (AIRBORNE) CHEW Chew 1 tablet by mouth daily.    . Multiple Vitamins-Minerals (HAIR/SKIN/NAILS) TABS Take 1 tablet by mouth daily.    . Omega-3 Fatty Acids (FISH OIL) 1200 MG CAPS Take 1,200 mg by mouth daily.    . Polyvinyl Alcohol-Povidone (REFRESH OP) Place 1 drop into both eyes as needed (for dry eyes).    . potassium chloride (K-DUR,KLOR-CON) 10 MEQ tablet Take 1 tablet (10 mEq total) by mouth daily. 30 tablet 6  . warfarin (COUMADIN) 5 MG tablet  TAKE 1 AND 1/2 TABLETS BY MOUTH EVERY DAY EXCEPT ON MONDAYS, WEDNESDAYS, AND FRIDAYS; ON THOSE DAYS TAKE ONLY 1 TABLET 35 tablet 3  . clindamycin (CLINDAGEL) 1 % gel Apply topically 2 (two) times daily. (Patient not taking: Reported on 05/18/2016) 30 g 0  . ondansetron (ZOFRAN) 4 MG tablet Take 1 tablet (4 mg total) by mouth every 8 (eight) hours as needed for nausea or vomiting. (Patient not taking: Reported on 05/18/2016) 20 tablet 0   No current facility-administered medications for this visit.     SURGICAL HISTORY:  Past Surgical History:  Procedure Laterality Date  . Bladder tack  1996  . CARDIOVERSION  11/02/09   x 2  . CATARACT EXTRACTION  2010/2011  . COLONOSCOPY  3 YRS AGO  . COLONOSCOPY  03/06/2011   Procedure: COLONOSCOPY;  Surgeon: Rogene Houston, MD;  Location: [facility];  Service: Endoscopy;  Laterality: N/A;  7:30 am  . ESOPHAGOGASTRODUODENOSCOPY    . FLEXIBLE SIGMOIDOSCOPY N/A 10/04/2012   Procedure: FLEXIBLE SIGMOIDOSCOPY;  Surgeon: Rogene Houston, MD;  Location: [facility];  Service: Endoscopy;  Laterality: N/A;  11:00-moved to 1015 Ann to notify pt  . THYROID SURGERY  1961  . TONSILLECTOMY    . VIDEO BRONCHOSCOPY  05/31/2011   Procedure: VIDEO BRONCHOSCOPY;  Surgeon: Grace Isaac, MD;  Location: [facility] OR;  Service: Thoracic;  Laterality: N/A;    REVIEW OF SYSTEMS:  Constitutional: negative Eyes: negative Ears, nose, mouth, throat, and face: negative Respiratory: negative Cardiovascular: negative Gastrointestinal: negative Genitourinary:negative Integument/breast: negative Hematologic/lymphatic: negative Musculoskeletal:negative Neurological: negative Behavioral/Psych: negative Endocrine: negative Allergic/Immunologic: negative   PHYSICAL EXAMINATION: General appearance: alert, cooperative, fatigued and no distress Head: Normocephalic, without obvious abnormality, atraumatic Neck: no adenopathy, no JVD, supple, symmetrical, trachea midline and thyroid  not enlarged, symmetric, no tenderness/mass/nodules Lymph nodes: Cervical, supraclavicular, and axillary nodes normal. Resp: clear to auscultation bilaterally Back: symmetric, no curvature. ROM normal. No CVA tenderness. Cardio: regular rate and rhythm, S1, S2 normal, no murmur, click, rub or gallop GI: soft, non-tender; bowel sounds normal; no masses,  no organomegaly Extremities: extremities normal, atraumatic, no cyanosis or edema Neurologic: Alert and oriented X 3, normal strength and tone. Normal symmetric reflexes. Normal coordination and gait  ECOG PERFORMANCE STATUS: 0 - Asymptomatic  Blood pressure 133/81, pulse 74, temperature 97.8 F (36.6 C), temperature source Oral, resp. rate 17, height _0  (1.6 m), weight 113 lb 6.4 oz (51.4 kg), SpO2 98 %.  LABORATORY DATA: Lab Results  Component Value Date   WBC 4.0 05/18/2016   HGB 14.0 05/18/2016   HCT 41.9 05/18/2016   MCV 90.7 05/18/2016   PLT 233 05/18/2016      Chemistry      Component Value Date/Time   NA 139 05/18/2016 1007   K 4.2 05/18/2016 1007   CL 102 01/07/2014 1636  CO2 26 05/18/2016 1007   BUN 15.3 05/18/2016 1007   CREATININE 0.7 05/18/2016 1007      Component Value Date/Time   CALCIUM 9.2 05/18/2016 1007   ALKPHOS 117 05/18/2016 1007   AST 27 05/18/2016 1007   ALT 17 05/18/2016 1007   BILITOT 0.57 05/18/2016 1007       RADIOGRAPHIC STUDIES: Ct Chest W Contrast  Result Date: 05/12/2016 CLINICAL DATA:  Subsequent treatment evaluation for RIGHT upper lobe lung carcinoma. EXAM: CT CHEST WITH CONTRAST TECHNIQUE: Multidetector CT imaging of the chest was performed during intravenous contrast administration. CONTRAST:  81m ISOVUE-300 IOPAMIDOL (ISOVUE-300) INJECTION 61% COMPARISON:  01/03/2016 FINDINGS: Cardiovascular: Coronary artery calcification and aortic atherosclerotic calcification. No pericardial fluid Mediastinum/Nodes: No axillary lymphadenopathy. Bilateral thyroid nodules again noted in  unchanged no mediastinal lymphadenopathy. No pericardial fluid. Esophagus normal. Lungs/Pleura: Nodular thickening along the superior aspect of the RIGHT oblique fissure is unchanged (image 35, series 3) for example. There is volume loss in RIGHT hemithorax consistent with partial lobectomy. No new pulmonary nodularity. Scattered sub 5 mm pulmonary nodules in the LEFT upper liver unchanged. Upper Abdomen: Limited view of the liver, kidneys, pancreas are unremarkable. Normal adrenal glands. Musculoskeletal: No aggressive osseous lesion. IMPRESSION: 1. No evidence lung cancer recurrence. 2. Stable pleural nodularity in the RIGHT hemithorax. 3. Stable scattered small pulmonary nodules in the LEFT lung. 4. Postoperative change the RIGHT hemithorax. 5.  Atherosclerotic calcification of the aorta. Electronically Signed   By: SSuzy BouchardM.D.   On: 05/12/2016 15:33    ASSESSMENT AND PLAN: This is a very pleasant 83years old white female with recurrent non-small cell lung cancer, adenocarcinoma with positive EGFR mutation currently on treatment with Iressa 250 mg by mouth daily status post 16 months. The patient had repeat CT scan of the chest performed recently for restaging of her disease. I personally and independently reviewed the scan images and discuss the results with the patient today. Her scan showed no clear evidence for disease progression but the patient continues to have stable nodularity in the right and left lungs. I recommended for the patient to continue her current treatment with Iressa with the same dose. I will see her back for follow-up visit in one month for reevaluation and repeat CBC as well as comprehensive metabolic panel. For the skin rash, the patient will continue on clindamycin lotion. For the episodes of diarrhea, she will use Imodium on as-needed basis. For the history of atrial fibrillation, she will continue her current treatment with Coumadin and diltiazem. For anxiety  she is currently on Xanax 0.5 mg by mouth every 8 hours as needed. The patient was advised to call immediately if she has any concerning symptoms in the interval. The patient voices understanding of current disease status and treatment options and is in agreement with the current care plan. All questions were answered. The patient knows to call the clinic with any problems, questions or concerns. We can certainly see the patient much sooner if necessary.   Disclaimer: This note was dictated with voice recognition software. Similar sounding words can inadvertently be transcribed and may not be corrected upon review.

## 2016-05-23 ENCOUNTER — Other Ambulatory Visit: Payer: Self-pay | Admitting: Cardiology

## 2016-05-30 ENCOUNTER — Telehealth: Payer: Self-pay | Admitting: Pharmacist

## 2016-05-30 ENCOUNTER — Ambulatory Visit (INDEPENDENT_AMBULATORY_CARE_PROVIDER_SITE_OTHER): Payer: Medicare Other | Admitting: *Deleted

## 2016-05-30 DIAGNOSIS — Z5181 Encounter for therapeutic drug level monitoring: Secondary | ICD-10-CM | POA: Diagnosis not present

## 2016-05-30 DIAGNOSIS — I4891 Unspecified atrial fibrillation: Secondary | ICD-10-CM

## 2016-05-30 LAB — POCT INR: INR: 1.8

## 2016-05-30 NOTE — Telephone Encounter (Signed)
Oral Chemotherapy Pharmacist Encounter  Received notification from Dr. Worthy Flank RN that patient had been inquiring about Iressa copayment support for 2018. Patient has been enrolled in [place] for Iressa free-of-charge from the manufacturer for 2017.  I called [employer], patient will need to send in a new application for [id number] to be considered for re-enrollment. I did try to sign patient up for copayment assistance grant from patient [facility]) as they had NSCLC money open this morning, but the fund had already closed by the time I was able to speak to a PAF representative after being on hold for >30 min.  I left patient a VM at her home number (cell number in chart stated number was no longer in service) requesting she call Oral Oncology Office to discuss options for copayment assistance for 2018.  Oral Oncology Clinic will continue to follow.  Johny Drilling, PharmD, BCPS, BCOP 05/30/2016  11:58 AM Oral Oncology Clinic [phone]

## 2016-05-30 NOTE — Telephone Encounter (Signed)
Oral Chemotherapy Pharmacist Encounter  Patient returned call and gave permission for [place] to complete [place]&me application for re-enrollment. I met patient in lobby of [facility]. She provided proof of income documentation and her current prescription insurance card. Patient signed AZ&me application.  Application completed and faxed to [employer] at [phone].  This encounter will continue to be updated until final determination.  Oral Oncology Clinic will continue to follow.   Johny Drilling, PharmD, BCPS, BCOP 05/30/2016  4:32 PM Oral Oncology Clinic [phone]

## 2016-05-31 ENCOUNTER — Ambulatory Visit: Payer: Medicare Other | Admitting: Cardiology

## 2016-06-12 ENCOUNTER — Telehealth: Payer: Self-pay | Admitting: Medical Oncology

## 2016-06-12 NOTE — Telephone Encounter (Signed)
receved fax from [facility] and me for Iressa application and has been approved for prescriptions saving programfro 1 year.

## 2016-06-20 ENCOUNTER — Ambulatory Visit (INDEPENDENT_AMBULATORY_CARE_PROVIDER_SITE_OTHER): Payer: Medicare Other | Admitting: *Deleted

## 2016-06-20 DIAGNOSIS — Z5181 Encounter for therapeutic drug level monitoring: Secondary | ICD-10-CM

## 2016-06-20 DIAGNOSIS — I4891 Unspecified atrial fibrillation: Secondary | ICD-10-CM | POA: Diagnosis not present

## 2016-06-20 LAB — POCT INR: INR: 2.8

## 2016-06-20 NOTE — Telephone Encounter (Signed)
Oral Chemotherapy Pharmacist Encounter  I called AZ[employer]me at [phone] to inquire about status of Iressa application as we have not yet received formal notification about patient's enrolment into their prescription savings program  Patient is enrolled in AZ[employer]me program 06/02/16-05/14/17 to receive Iressa from the manufacturer free-of-charge  I called and spoke with patient about good news. All questions answered. Patient expressed understanding and appreciation. She knows to call the office with questions or concerns.  Oral Oncology Clinic will sign off at this time. Please let us know if we can be of assistance in the future.  Johny Drilling, PharmD, BCPS, BCOP 06/20/2016  2:20 PM Oral Oncology Clinic [phone]

## 2016-06-20 NOTE — Telephone Encounter (Signed)
Thank you :)

## 2016-06-21 ENCOUNTER — Other Ambulatory Visit (HOSPITAL_BASED_OUTPATIENT_CLINIC_OR_DEPARTMENT_OTHER): Payer: Medicare Other

## 2016-06-21 ENCOUNTER — Encounter: Payer: Self-pay | Admitting: Internal Medicine

## 2016-06-21 ENCOUNTER — Ambulatory Visit (HOSPITAL_BASED_OUTPATIENT_CLINIC_OR_DEPARTMENT_OTHER): Payer: Medicare Other | Admitting: Internal Medicine

## 2016-06-21 ENCOUNTER — Telehealth: Payer: Self-pay | Admitting: Internal Medicine

## 2016-06-21 VITALS — BP 127/63 | HR 77 | Temp 97.8°F | Resp 17 | Ht 63.0 in | Wt 112.3 lb

## 2016-06-21 DIAGNOSIS — C3411 Malignant neoplasm of upper lobe, right bronchus or lung: Secondary | ICD-10-CM

## 2016-06-21 DIAGNOSIS — Z5181 Encounter for therapeutic drug level monitoring: Secondary | ICD-10-CM

## 2016-06-21 DIAGNOSIS — L270 Generalized skin eruption due to drugs and medicaments taken internally: Secondary | ICD-10-CM

## 2016-06-21 LAB — CBC WITH DIFFERENTIAL/PLATELET
BASO%: 0.8 % (ref 0.0–2.0)
BASOS ABS: 0 10*3/uL (ref 0.0–0.1)
EOS ABS: 0.2 10*3/uL (ref 0.0–0.5)
EOS%: 4.2 % (ref 0.0–7.0)
HCT: 45.3 % (ref 34.8–46.6)
HEMOGLOBIN: 14.7 g/dL (ref 11.6–15.9)
LYMPH%: 29.6 % (ref 14.0–49.7)
MCH: 30.5 pg (ref 25.1–34.0)
MCHC: 32.5 g/dL (ref 31.5–36.0)
MCV: 94 fL (ref 79.5–101.0)
MONO#: 0.5 10*3/uL (ref 0.1–0.9)
MONO%: 13.7 % (ref 0.0–14.0)
NEUT%: 51.7 % (ref 38.4–76.8)
NEUTROS ABS: 2 10*3/uL (ref 1.5–6.5)
Platelets: 229 10*3/uL (ref 145–400)
RBC: 4.82 10*6/uL (ref 3.70–5.45)
RDW: 13.5 % (ref 11.2–14.5)
WBC: 3.8 10*3/uL — AB (ref 3.9–10.3)
lymph#: 1.1 10*3/uL (ref 0.9–3.3)

## 2016-06-21 LAB — COMPREHENSIVE METABOLIC PANEL
ALT: 18 U/L (ref 0–55)
AST: 29 U/L (ref 5–34)
Albumin: 3.7 g/dL (ref 3.5–5.0)
Alkaline Phosphatase: 111 U/L (ref 40–150)
Anion Gap: 7 mEq/L (ref 3–11)
BUN: 12.4 mg/dL (ref 7.0–26.0)
CHLORIDE: 103 meq/L (ref 98–109)
CO2: 31 meq/L — AB (ref 22–29)
Calcium: 9.5 mg/dL (ref 8.4–10.4)
Creatinine: 0.7 mg/dL (ref 0.6–1.1)
EGFR: 75 mL/min/{1.73_m2} — AB (ref >90)
GLUCOSE: 106 mg/dL (ref 70–140)
POTASSIUM: 3.8 meq/L (ref 3.5–5.1)
SODIUM: 141 meq/L (ref 136–145)
Total Bilirubin: 0.57 mg/dL (ref 0.20–1.20)
Total Protein: 7 g/dL (ref 6.4–8.3)

## 2016-06-21 NOTE — Telephone Encounter (Signed)
Appointments scheduled per, 06/21/16 los. Patient was given a copy of the AVS report and appointment schedule, per 06/21/16 los.  Appointments scheduled per, 06/22/16 los. Patient was given a copy of the AVS report and appointment schedule, per 06/22/16 los.

## 2016-06-21 NOTE — Progress Notes (Signed)
[facility] [phone]   [phone]  OFFICE PROGRESS NOTE  [name], MD [address] [place] [address]  DIAGNOSIS: Recurrent non-small cell lung cancer presented with bilateral lung nodules in October 2016 initially diagnosed as a stage IA adenocarcinoma with positive EGFR mutation with deletion in exon 23 in December 2012.  PRIOR THERAPY: Status post bronchoscopy with right VATS and right upper lobectomy with lymph node dissection under the care of Dr. Servando Snare on 05/31/2011.  CURRENT THERAPY: Iressa 250 mg by mouth daily started 04/17/2015, status post 14 months of treatment.  INTERVAL HISTORY: Natalie Carlson 83 y.o. female came to the clinic today for follow-up visit accompanied by her friend. The patient is currently on treatment with Iressa 250 mg by mouth daily status post 14 months. She has been tolerating her treatment well except for minor skin breaks at the tip of her fingers. She denied having any skin rash or diarrhea. She denied having any significant weight loss or night sweats. She has no nausea, vomiting or constipation. She has no chest pain, shortness of breath, cough or hemoptysis. She is here today for evaluation and repeat blood work.   MEDICAL HISTORY: Past Medical History:  Diagnosis Date  . Arthritis   . Atrial fibrillation ([place])   . Cataract of both eyes   . Chronic back pain   . Chronic diarrhea   . Colitis   . Hemorrhoids   . History of colon polyps   . History of migraines   . Hyperlipidemia   . Hypothyroidism   . Macular degeneration, dry   . Malignant neoplasm of right upper lobe of lung (HCC)    Stage 1A  EGFR positive  Deletion in exon 19.  ALK-negative  Right Upper Lobe Lung Mass  SUV 2.9 on PET Status post VATS and right upper lobe lobectomy 05/31/2011  . Mucus in stool   . Nephrolithiasis    1994 - passed on its on    ALLERGIES:  is allergic to doxycycline; entocort ec [budesonide];  neurontin [gabapentin]; vicodin [hydrocodone-acetaminophen]; adhesive [tape]; amoxicillin; chocolate; codeine; levaquin [levofloxacin in d5w]; morphine and related; naprosyn [naproxen]; penicillins; prednisone; tramadol; and uncoded nonscreenable allergen.  MEDICATIONS:  Current Outpatient Prescriptions  Medication Sig Dispense Refill  . acetaminophen (TYLENOL) 325 MG tablet Take 650 mg by mouth every 6 (six) hours as needed for moderate pain. Reported on 06/02/2015    . ALPRAZolam (XANAX) 0.5 MG tablet Take 0.5 mg by mouth every 8 (eight) hours as needed for anxiety. Reported on 06/02/2015    . APRISO 0.375 g 24 hr capsule TAKE 2 CAPSULE BY MOUTH TWICE DAILY 120 capsule 6  . Cholecalciferol 2000 UNITS CAPS Take 2,000 Units by mouth daily.     . clindamycin (CLINDAGEL) 1 % gel Apply topically 2 (two) times daily. (Patient not taking: Reported on 05/18/2016) 30 g 0  . diclofenac sodium (VOLTAREN) 1 % GEL Apply 1 application topically 2 (two) times daily as needed (for neck pain).     Marland Kitchen diltiazem (CARDIZEM CD) 180 MG 24 hr capsule TAKE 1 CAPSULE BY MOUTH EVERY DAY (DOSE INCREASE) 30 capsule 6  . furosemide (LASIX) 20 MG tablet TAKE 1 TABLET BY MOUTH EVERY DAY AS NEEDED FOR FLUID 30 tablet 5  . gefitinib (IRESSA) 250 MG tablet Take 1 tablet (250 mg total) by mouth daily. 30 tablet 2  . LUTEIN PO Take 1 capsule by mouth daily. Reported on 10/27/2015    .  MEPHYTON 5 MG tablet TAKE 1/2 TABLET NOW AND SAVE THE OTHER 1/2 TABLET FOR LATER  0  . Multiple Vitamins-Minerals (AIRBORNE) CHEW Chew 1 tablet by mouth daily.    . Multiple Vitamins-Minerals (HAIR/SKIN/NAILS) TABS Take 1 tablet by mouth daily.    . Omega-3 Fatty Acids (FISH OIL) 1200 MG CAPS Take 1,200 mg by mouth daily.    . ondansetron (ZOFRAN) 4 MG tablet Take 1 tablet (4 mg total) by mouth every 8 (eight) hours as needed for nausea or vomiting. (Patient not taking: Reported on 05/18/2016) 20 tablet 0  . Polyvinyl Alcohol-Povidone (REFRESH OP) Place 1  drop into both eyes as needed (for dry eyes).    . potassium chloride (K-DUR,KLOR-CON) 10 MEQ tablet Take 1 tablet (10 mEq total) by mouth daily. 30 tablet 6  . warfarin (COUMADIN) 5 MG tablet TAKE 1 AND 1/2 TABLETS BY MOUTH EVERY DAY EXCEPT ON MONDAYS, WEDNESDAYS, AND FRIDAYS; ON THOSE DAYS TAKE ONLY 1 TABLET 35 tablet 3   No current facility-administered medications for this visit.     SURGICAL HISTORY:  Past Surgical History:  Procedure Laterality Date  . Bladder tack  1996  . CARDIOVERSION  11/02/09   x 2  . CATARACT EXTRACTION  2010/2011  . COLONOSCOPY  3 YRS AGO  . COLONOSCOPY  03/06/2011   Procedure: COLONOSCOPY;  Surgeon: Rogene Houston, MD;  [facility];  Service: Endoscopy;  Laterality: N/A;  7:30 am  . ESOPHAGOGASTRODUODENOSCOPY    . FLEXIBLE SIGMOIDOSCOPY N/A 10/04/2012   Procedure: FLEXIBLE SIGMOIDOSCOPY;  Surgeon: Rogene Houston, MD;  Location: [facility];  Service: Endoscopy;  Laterality: N/A;  11:00-moved to 1015 Ann to notify pt  . THYROID SURGERY  1961  . TONSILLECTOMY    . VIDEO BRONCHOSCOPY  05/31/2011   Procedure: VIDEO BRONCHOSCOPY;  Surgeon: Grace Isaac, MD;  Location: [facility] OR;  Service: Thoracic;  Laterality: N/A;    REVIEW OF SYSTEMS:  A comprehensive review of systems was negative.   PHYSICAL EXAMINATION: General appearance: alert, cooperative and no distress Head: Normocephalic, without obvious abnormality, atraumatic Neck: no adenopathy, no JVD, supple, symmetrical, trachea midline and thyroid not enlarged, symmetric, no tenderness/mass/nodules Lymph nodes: Cervical, supraclavicular, and axillary nodes normal. Resp: clear to auscultation bilaterally Back: symmetric, no curvature. ROM normal. No CVA tenderness. Cardio: regular rate and rhythm, S1, S2 normal, no murmur, click, rub or gallop GI: soft, non-tender; bowel sounds normal; no masses,  no organomegaly Extremities: extremities normal, atraumatic, no cyanosis or edema  ECOG  PERFORMANCE STATUS: 0 - Asymptomatic  There were no vitals taken for this visit.  LABORATORY DATA: Lab Results  Component Value Date   WBC 3.8 (L) 06/21/2016   HGB 14.7 06/21/2016   HCT 45.3 06/21/2016   MCV 94.0 06/21/2016   PLT 229 06/21/2016      Chemistry      Component Value Date/Time   NA 139 05/18/2016 1007   K 4.2 05/18/2016 1007   CL 102 01/07/2014 1636   CO2 26 05/18/2016 1007   BUN 15.3 05/18/2016 1007   CREATININE 0.7 05/18/2016 1007      Component Value Date/Time   CALCIUM 9.2 05/18/2016 1007   ALKPHOS 117 05/18/2016 1007   AST 27 05/18/2016 1007   ALT 17 05/18/2016 1007   BILITOT 0.57 05/18/2016 1007       RADIOGRAPHIC STUDIES: No results found.  ASSESSMENT AND PLAN:  This is a very pleasant 83 years old white female with recurrent non-small  cell lung cancer, adenocarcinoma with positive EGFR mutation in exon 21. She is currently on treatment with Tarceva 250 mg by mouth daily status post 14 months of treatment. The patient is tolerating her treatment well with no significant adverse effects. I recommended for her to continue on her treatment with the same dose. She will come back for follow-up visit in one month for evaluation and repeat blood work. The patient was advised to call immediately if she has any concerning symptoms in the interval. The patient voices understanding of current disease status and treatment options and is in agreement with the current care plan. All questions were answered. The patient knows to call the clinic with any problems, questions or concerns. We can certainly see the patient much sooner if necessary. I spent 10 minutes counseling the patient face to face. The total time spent in the appointment was 15 minutes.  Disclaimer: This note was dictated with voice recognition software. Similar sounding words can inadvertently be transcribed and may not be corrected upon review.

## 2016-06-27 ENCOUNTER — Other Ambulatory Visit (INDEPENDENT_AMBULATORY_CARE_PROVIDER_SITE_OTHER): Payer: Self-pay | Admitting: Internal Medicine

## 2016-06-30 ENCOUNTER — Telehealth: Payer: Self-pay | Admitting: *Deleted

## 2016-06-30 NOTE — Telephone Encounter (Signed)
Fax from [facility] pt Iressa shipped out on 06/28/16

## 2016-07-04 NOTE — Progress Notes (Signed)
Cardiology Office Note  Date: 07/05/2016   ID: Natalie Carlson, DOB [birth year], MRN [id number]  PCP: Gar Ponto, MD  Primary Cardiologist: Rozann Lesches, MD   Chief Complaint  Patient presents with  . Atrial Fibrillation    History of Present Illness: Natalie Carlson is an 83 y.o. female last seen in March 2017. She presents for a follow-up visit. States that she has been doing very well overall, no significant chest pain or palpitations. She tells me that Dr. Eula Fried has retired and she is to establish with Dr. Quillian Quince soon for primary care follow-up.  She continues to follow in the anticoagulation clinic on Coumadin. Reports no spontaneous bleeding problems. She does bruise easily.  She still follows in the Oncology clinic with a history of recurrent non-small cell lung cancer. She has serial CT scans, states that things have been stable recently. She continues on Iressa.  She does have intermittent leg edema, continues on Lasix, sometimes doubles the dose for a day or so. Her weight has been stable however.  Past Medical History:  Diagnosis Date  . Arthritis   . Atrial fibrillation ([place])   . Cataract of both eyes   . Chronic back pain   . Chronic diarrhea   . Colitis   . Hemorrhoids   . History of colon polyps   . History of migraines   . Hyperlipidemia   . Hypothyroidism   . Macular degeneration, dry   . Malignant neoplasm of right upper lobe of lung (HCC)    Stage 1A  EGFR positive  Deletion in exon 19.  ALK-negative  Right Upper Lobe Lung Mass  SUV 2.9 on PET Status post VATS and right upper lobe lobectomy 05/31/2011  . Mucus in stool   . Nephrolithiasis    1994 - passed on its on    Past Surgical History:  Procedure Laterality Date  . Bladder tack  1996  . CARDIOVERSION  11/02/09   x 2  . CATARACT EXTRACTION  2010/2011  . COLONOSCOPY  3 YRS AGO  . COLONOSCOPY  03/06/2011   Procedure: COLONOSCOPY;  Surgeon: Rogene Houston, MD;  [facility];   Service: Endoscopy;  Laterality: N/A;  7:30 am  . ESOPHAGOGASTRODUODENOSCOPY    . FLEXIBLE SIGMOIDOSCOPY N/A 10/04/2012   Procedure: FLEXIBLE SIGMOIDOSCOPY;  Surgeon: Rogene Houston, MD;  [facility];  Service: Endoscopy;  Laterality: N/A;  11:00-moved to 1015 Ann to notify pt  . THYROID SURGERY  1961  . TONSILLECTOMY    . VIDEO BRONCHOSCOPY  05/31/2011   Procedure: VIDEO BRONCHOSCOPY;  Surgeon: Grace Isaac, MD;  Location: [name] OR;  Service: Thoracic;  Laterality: N/A;    Current Outpatient Prescriptions  Medication Sig Dispense Refill  . acetaminophen (TYLENOL) 325 MG tablet Take 650 mg by mouth every 6 (six) hours as needed for moderate pain. Reported on 06/02/2015    . ALPRAZolam (XANAX) 0.5 MG tablet Take 0.5 mg by mouth every 8 (eight) hours as needed for anxiety. Reported on 06/02/2015    . APRISO 0.375 g 24 hr capsule TAKE 2 CAPSULE BY MOUTH TWICE DAILY 120 capsule 6  . Cholecalciferol 2000 UNITS CAPS Take 2,000 Units by mouth daily.     . diclofenac sodium (VOLTAREN) 1 % GEL Apply 1 application topically 2 (two) times daily as needed (for neck pain).     Marland Kitchen diltiazem (CARDIZEM CD) 180 MG 24 hr capsule TAKE 1 CAPSULE BY MOUTH EVERY DAY (DOSE INCREASE)  30 capsule 6  . furosemide (LASIX) 20 MG tablet TAKE 1 TABLET BY MOUTH EVERY DAY AS NEEDED FOR FLUID 30 tablet 5  . gefitinib (IRESSA) 250 MG tablet Take 1 tablet (250 mg total) by mouth daily. 30 tablet 2  . LUTEIN PO Take 1 capsule by mouth daily. Reported on 10/27/2015    . Multiple Vitamins-Minerals (AIRBORNE) CHEW Chew 1 tablet by mouth daily.    . Multiple Vitamins-Minerals (HAIR/SKIN/NAILS) TABS Take 1 tablet by mouth daily.    . Omega-3 Fatty Acids (FISH OIL) 1200 MG CAPS Take 1,200 mg by mouth daily.    . Polyvinyl Alcohol-Povidone (REFRESH OP) Place 1 drop into both eyes as needed (for dry eyes).    . potassium chloride (K-DUR,KLOR-CON) 10 MEQ tablet Take 1 tablet (10 mEq total) by mouth daily. 30 tablet 6  .  warfarin (COUMADIN) 5 MG tablet TAKE 1 AND 1/2 TABLETS BY MOUTH EVERY DAY EXCEPT ON MONDAYS, WEDNESDAYS, AND FRIDAYS; ON THOSE DAYS TAKE ONLY 1 TABLET 35 tablet 3  . MEPHYTON 5 MG tablet TAKE 1/2 TABLET NOW AND SAVE THE OTHER 1/2 TABLET FOR LATER  0  . ondansetron (ZOFRAN) 4 MG tablet Take 1 tablet (4 mg total) by mouth every 8 (eight) hours as needed for nausea or vomiting. (Patient not taking: Reported on 05/18/2016) 20 tablet 0   No current facility-administered medications for this visit.    Allergies:  Doxycycline; Entocort ec [budesonide]; Neurontin [gabapentin]; Vicodin [hydrocodone-acetaminophen]; Adhesive [tape]; Amoxicillin; Chocolate; Codeine; Levaquin [levofloxacin in d5w]; Morphine and related; Naprosyn [naproxen]; Penicillins; Prednisone; Tramadol; and Uncoded nonscreenable allergen   Social History: The patient  reports that she has never smoked. She has never used smokeless tobacco. She reports that she does not drink alcohol or use drugs.   ROS:  Please see the history of present illness. Otherwise, complete review of systems is positive for none.  All other systems are reviewed and negative.   Physical Exam: VS:  BP 128/85   Pulse (!) 57   Ht _0  (1.6 m)   Wt 114 lb 12.8 oz (52.1 kg)   SpO2 97%   BMI 20.34 kg/m , BMI Body mass index is 20.34 kg/m.  Wt Readings from Last 3 Encounters:  07/05/16 114 lb 12.8 oz (52.1 kg)  06/21/16 112 lb 4.8 oz (50.9 kg)  05/18/16 113 lb 6.4 oz (51.4 kg)    Pleasant elderly woman, appears comfortable at rest  HEENT: Conjunctiva and lids normal, oropharynx clear.  Neck: Supple, no elevated JVP or carotid bruits. Lungs: Diminished but cleaar to auscultation, nonlabored breathing at rest.  Cardiac: Irregularly irregular, no S3 or significant systolic murmur, no pericardial rub.  Abdomen: Soft, nontender, bowel sounds present.  Extremities: Trace ankleedema, distal pulses 2+.  Skin: Warm and dry. Maculopapular erythema on the legs  distally. Musculoskeletal: No kyphosis. Neuropsychiatric: Alert and oriented 3, affect appropriate.  ECG: I personally reviewed the tracing from 08/10/2015 which showed rate-controlled atrial fibrillation.  Recent Labwork: 06/21/2016: ALT 18; AST 29; BUN 12.4; Creatinine 0.7; HGB 14.7; Platelets 229; Potassium 3.8; Sodium 141   Other Studies Reviewed Today:  Echocardiogram 02/04/2014: Study Conclusions  - Left ventricle: The cavity size was normal. Wall thickness was normal. Systolic function was normal. The estimated ejection fraction was in the range of 60% to 65%. Wall motion was normal; there were no regional wall motion abnormalities. The study is not technically sufficient to allow evaluation of LV diastolic function. - Aortic valve: Mildly calcified annulus. Trileaflet. There  was trivial regurgitation. - Mitral valve: Calcified annulus. There was mild regurgitation. - Right ventricle: The cavity size was mildly to moderately dilated. Systolic function was normal. - Right atrium: The atrium was mildly dilated. Central venous pressure (est): 3 mm Hg. - Atrial septum: No defect or patent foramen ovale was identified. - Tricuspid valve: There was mild-moderate regurgitation. - Pulmonary arteries: PA peak pressure: 34 mm Hg (S). - Pericardium, extracardiac: There was no pericardial effusion.  Impressions:  - Normal LV wall thickness and chamber size with LVEF 60-65%. Indeterminate diastolic function in the setting of coarse atrial fibrillation/flutter. MAC with mild mitral regurgitation. Mild to moderate RV enlargement with normal contraction. Mild right atrial enlargement. Mild to moderate tricuspid regurgitation with PASP 34 mmHg.  Carotid Dopplers 07/10/2014: Stable 1-39% bilateral ICA stenoses.  Assessment and Plan:   1. Chronic atrial fibrillation, symptomatically well controlled. Continue strategy of heart rate control and anticoagulation.  She is on Cardizem CD and Coumadin with follow-up in the anticoagulation clinic.  2. Hyperlipidemia by history, continues on omega-3 supplements. She will be establishing with Dr. Quillian Quince soon for follow-up.  3. Mild carotid atherosclerosis, asymptomatic.  4. History of recurrent non-small cell lung cancer, stable at this point. She continues follow-up with oncology, on Iressa.  Current medicines were reviewed with the patient today.  Disposition: Follow-up in 6 months.  Signed, Satira Sark, MD, [facility] 07/05/2016 9:25 AM    [place] [facility] [place], [place], [place] [address] Phone: [phone]; Fax: [phone]

## 2016-07-05 ENCOUNTER — Ambulatory Visit (INDEPENDENT_AMBULATORY_CARE_PROVIDER_SITE_OTHER): Payer: Medicare Other | Admitting: Cardiology

## 2016-07-05 ENCOUNTER — Encounter: Payer: Self-pay | Admitting: Cardiology

## 2016-07-05 VITALS — BP 128/85 | HR 57 | Ht 63.0 in | Wt 114.8 lb

## 2016-07-05 DIAGNOSIS — I739 Peripheral vascular disease, unspecified: Secondary | ICD-10-CM

## 2016-07-05 DIAGNOSIS — I482 Chronic atrial fibrillation, unspecified: Secondary | ICD-10-CM

## 2016-07-05 DIAGNOSIS — Z85118 Personal history of other malignant neoplasm of bronchus and lung: Secondary | ICD-10-CM

## 2016-07-05 DIAGNOSIS — I779 Disorder of arteries and arterioles, unspecified: Secondary | ICD-10-CM | POA: Diagnosis not present

## 2016-07-05 DIAGNOSIS — E782 Mixed hyperlipidemia: Secondary | ICD-10-CM

## 2016-07-05 NOTE — Patient Instructions (Signed)
Your physician wants you to follow-up in: 6 months with Dr. Domenic Polite. You will receive a reminder letter in the mail two months in advance. If you don't receive a letter, please call our office to schedule the follow-up appointment.   Your physician recommends that you continue on your current medications as directed. Please refer to the Current Medication list given to you today.    Thank you for choosing [facility]!!

## 2016-07-18 ENCOUNTER — Ambulatory Visit (INDEPENDENT_AMBULATORY_CARE_PROVIDER_SITE_OTHER): Payer: Medicare Other | Admitting: *Deleted

## 2016-07-18 DIAGNOSIS — I4891 Unspecified atrial fibrillation: Secondary | ICD-10-CM | POA: Diagnosis not present

## 2016-07-18 DIAGNOSIS — Z5181 Encounter for therapeutic drug level monitoring: Secondary | ICD-10-CM

## 2016-07-18 LAB — POCT INR: INR: 2.6

## 2016-07-20 ENCOUNTER — Telehealth: Payer: Self-pay | Admitting: Internal Medicine

## 2016-07-20 ENCOUNTER — Ambulatory Visit (HOSPITAL_BASED_OUTPATIENT_CLINIC_OR_DEPARTMENT_OTHER): Payer: Medicare Other | Admitting: Internal Medicine

## 2016-07-20 ENCOUNTER — Other Ambulatory Visit (HOSPITAL_BASED_OUTPATIENT_CLINIC_OR_DEPARTMENT_OTHER): Payer: Medicare Other

## 2016-07-20 ENCOUNTER — Encounter: Payer: Self-pay | Admitting: Internal Medicine

## 2016-07-20 VITALS — BP 134/77 | HR 60 | Temp 97.7°F | Resp 18 | Wt 110.7 lb

## 2016-07-20 DIAGNOSIS — R197 Diarrhea, unspecified: Secondary | ICD-10-CM

## 2016-07-20 DIAGNOSIS — Z5181 Encounter for therapeutic drug level monitoring: Secondary | ICD-10-CM

## 2016-07-20 DIAGNOSIS — C3411 Malignant neoplasm of upper lobe, right bronchus or lung: Secondary | ICD-10-CM

## 2016-07-20 LAB — COMPREHENSIVE METABOLIC PANEL
ALT: 16 U/L (ref 0–55)
ANION GAP: 7 meq/L (ref 3–11)
AST: 27 U/L (ref 5–34)
Albumin: 3.7 g/dL (ref 3.5–5.0)
Alkaline Phosphatase: 114 U/L (ref 40–150)
BILIRUBIN TOTAL: 0.62 mg/dL (ref 0.20–1.20)
BUN: 14.8 mg/dL (ref 7.0–26.0)
CALCIUM: 9.5 mg/dL (ref 8.4–10.4)
CHLORIDE: 103 meq/L (ref 98–109)
CO2: 29 mEq/L (ref 22–29)
CREATININE: 0.8 mg/dL (ref 0.6–1.1)
EGFR: 71 mL/min/{1.73_m2} — ABNORMAL LOW (ref >90)
Glucose: 88 mg/dl (ref 70–140)
Potassium: 4.4 mEq/L (ref 3.5–5.1)
Sodium: 139 mEq/L (ref 136–145)
Total Protein: 7.1 g/dL (ref 6.4–8.3)

## 2016-07-20 LAB — CBC WITH DIFFERENTIAL/PLATELET
BASO%: 0.5 % (ref 0.0–2.0)
BASOS ABS: 0 10*3/uL (ref 0.0–0.1)
EOS ABS: 0.3 10*3/uL (ref 0.0–0.5)
EOS%: 6.6 % (ref 0.0–7.0)
HEMATOCRIT: 44.1 % (ref 34.8–46.6)
HGB: 14.8 g/dL (ref 11.6–15.9)
LYMPH#: 1.1 10*3/uL (ref 0.9–3.3)
LYMPH%: 29.4 % (ref 14.0–49.7)
MCH: 30.4 pg (ref 25.1–34.0)
MCHC: 33.6 g/dL (ref 31.5–36.0)
MCV: 90.6 fL (ref 79.5–101.0)
MONO#: 0.8 10*3/uL (ref 0.1–0.9)
MONO%: 20.1 % — ABNORMAL HIGH (ref 0.0–14.0)
NEUT#: 1.6 10*3/uL (ref 1.5–6.5)
NEUT%: 43.4 % (ref 38.4–76.8)
PLATELETS: 215 10*3/uL (ref 145–400)
RBC: 4.87 10*6/uL (ref 3.70–5.45)
RDW: 13.4 % (ref 11.2–14.5)
WBC: 3.8 10*3/uL — ABNORMAL LOW (ref 3.9–10.3)

## 2016-07-20 NOTE — Telephone Encounter (Signed)
Gave patient AVS and calender per 07/20/2016 los.

## 2016-07-20 NOTE — Progress Notes (Signed)
[facility] Telephone:(336) [phone]   Fax:([phone]  OFFICE PROGRESS NOTE  Gar Ponto, MD [place] [address]  DIAGNOSIS: Recurrent non-small cell lung cancer presented with bilateral lung nodules in October 2016 initially diagnosed as a stage IA adenocarcinoma with positive EGFR mutation with deletion in exon 74 in December 2012.  PRIOR THERAPY: Status post bronchoscopy with right VATS and right upper lobectomy with lymph node dissection under the care of Dr. Servando Snare on 05/31/2011.  CURRENT THERAPY: Iressa 250 mg by mouth daily started 04/17/2015, status post 15 months of treatment.  INTERVAL HISTORY: Natalie Carlson 83 y.o. female capsule the clinic today for follow-up visit accompanied by her friend. The patient is feeling fine today with no specific complaints. She had 3 episodes of diarrhea a week ago after eating a lot of fried food. This improved spontaneously. She denied having any chest pain, shortness breath, cough or hemoptysis. She has no weight loss or night sweats. She has no skin rash. She is here today for evaluation and repeat blood work.   MEDICAL HISTORY: Past Medical History:  Diagnosis Date  . Arthritis   . Atrial fibrillation ([place])   . Cataract of both eyes   . Chronic back pain   . Chronic diarrhea   . Colitis   . Hemorrhoids   . History of colon polyps   . History of migraines   . Hyperlipidemia   . Hypothyroidism   . Macular degeneration, dry   . Malignant neoplasm of right upper lobe of lung (HCC)    Stage 1A  EGFR positive  Deletion in exon 19.  ALK-negative  Right Upper Lobe Lung Mass  SUV 2.9 on PET Status post VATS and right upper lobe lobectomy 05/31/2011  . Mucus in stool   . Nephrolithiasis    1994 - passed on its on    ALLERGIES:  is allergic to doxycycline; entocort ec [budesonide]; neurontin [gabapentin]; vicodin [hydrocodone-acetaminophen]; adhesive [tape]; amoxicillin; chocolate; codeine; levaquin  [levofloxacin in d5w]; morphine and related; naprosyn [naproxen]; penicillins; prednisone; tramadol; and uncoded nonscreenable allergen.  MEDICATIONS:  Current Outpatient Prescriptions  Medication Sig Dispense Refill  . ALPRAZolam (XANAX) 0.5 MG tablet Take 0.5 mg by mouth every 8 (eight) hours as needed for anxiety. Reported on 06/02/2015    . APRISO 0.375 g 24 hr capsule TAKE 2 CAPSULE BY MOUTH TWICE DAILY 120 capsule 6  . Cholecalciferol 2000 UNITS CAPS Take 2,000 Units by mouth daily.     . diclofenac sodium (VOLTAREN) 1 % GEL Apply 1 application topically 2 (two) times daily as needed (for neck pain).     Marland Kitchen diltiazem (CARDIZEM CD) 180 MG 24 hr capsule TAKE 1 CAPSULE BY MOUTH EVERY DAY (DOSE INCREASE) 30 capsule 6  . gefitinib (IRESSA) 250 MG tablet Take 1 tablet (250 mg total) by mouth daily. 30 tablet 2  . LUTEIN PO Take 1 capsule by mouth daily. Reported on 10/27/2015    . MEPHYTON 5 MG tablet TAKE 1/2 TABLET NOW AND SAVE THE OTHER 1/2 TABLET FOR LATER  0  . Multiple Vitamins-Minerals (AIRBORNE) CHEW Chew 1 tablet by mouth daily.    . Multiple Vitamins-Minerals (HAIR/SKIN/NAILS) TABS Take 1 tablet by mouth daily.    . Omega-3 Fatty Acids (FISH OIL) 1200 MG CAPS Take 1,200 mg by mouth daily.    . Polyvinyl Alcohol-Povidone (REFRESH OP) Place 1 drop into both eyes as needed (for dry eyes).    . potassium  chloride (K-DUR,KLOR-CON) 10 MEQ tablet Take 1 tablet (10 mEq total) by mouth daily. 30 tablet 6  . warfarin (COUMADIN) 5 MG tablet TAKE 1 AND 1/2 TABLETS BY MOUTH EVERY DAY EXCEPT ON MONDAYS, WEDNESDAYS, AND FRIDAYS; ON THOSE DAYS TAKE ONLY 1 TABLET 35 tablet 3  . acetaminophen (TYLENOL) 325 MG tablet Take 650 mg by mouth every 6 (six) hours as needed for moderate pain. Reported on 06/02/2015    . furosemide (LASIX) 20 MG tablet TAKE 1 TABLET BY MOUTH EVERY DAY AS NEEDED FOR FLUID (Patient not taking: Reported on 07/20/2016) 30 tablet 5  . ondansetron (ZOFRAN) 4 MG tablet Take 1 tablet (4 mg  total) by mouth every 8 (eight) hours as needed for nausea or vomiting. (Patient not taking: Reported on 05/18/2016) 20 tablet 0   No current facility-administered medications for this visit.     SURGICAL HISTORY:  Past Surgical History:  Procedure Laterality Date  . Bladder tack  1996  . CARDIOVERSION  11/02/09   x 2  . CATARACT EXTRACTION  2010/2011  . COLONOSCOPY  3 YRS AGO  . COLONOSCOPY  03/06/2011   Procedure: COLONOSCOPY;  Surgeon: Rogene Houston, MD;  [facility];  Service: Endoscopy;  Laterality: N/A;  7:30 am  . ESOPHAGOGASTRODUODENOSCOPY    . FLEXIBLE SIGMOIDOSCOPY N/A 10/04/2012   Procedure: FLEXIBLE SIGMOIDOSCOPY;  Surgeon: Rogene Houston, MD;  [facility];  Service: Endoscopy;  Laterality: N/A;  11:00-moved to 1015 Ann to notify pt  . THYROID SURGERY  1961  . TONSILLECTOMY    . VIDEO BRONCHOSCOPY  05/31/2011   Procedure: VIDEO BRONCHOSCOPY;  Surgeon: Grace Isaac, MD;  Location: [facility] OR;  Service: Thoracic;  Laterality: N/A;    REVIEW OF SYSTEMS:  A comprehensive review of systems was negative.   PHYSICAL EXAMINATION: General appearance: alert, cooperative and no distress Head: Normocephalic, without obvious abnormality, atraumatic Neck: no adenopathy, no JVD, supple, symmetrical, trachea midline and thyroid not enlarged, symmetric, no tenderness/mass/nodules Lymph nodes: Cervical, supraclavicular, and axillary nodes normal. Resp: clear to auscultation bilaterally Back: symmetric, no curvature. ROM normal. No CVA tenderness. Cardio: regular rate and rhythm, S1, S2 normal, no murmur, click, rub or gallop GI: soft, non-tender; bowel sounds normal; no masses,  no organomegaly Extremities: extremities normal, atraumatic, no cyanosis or edema  ECOG PERFORMANCE STATUS: 0 - Asymptomatic  Blood pressure 134/77, pulse 60, temperature 97.7 F (36.5 C), temperature source Oral, resp. rate 18, weight 110 lb 11.2 oz (50.2 kg), SpO2 94 %.  LABORATORY  DATA: Lab Results  Component Value Date   WBC 3.8 (L) 07/20/2016   HGB 14.8 07/20/2016   HCT 44.1 07/20/2016   MCV 90.6 07/20/2016   PLT 215 07/20/2016      Chemistry      Component Value Date/Time   NA 139 07/20/2016 0930   K 4.4 07/20/2016 0930   CL 102 01/07/2014 1636   CO2 29 07/20/2016 0930   BUN 14.8 07/20/2016 0930   CREATININE 0.8 07/20/2016 0930      Component Value Date/Time   CALCIUM 9.5 07/20/2016 0930   ALKPHOS 114 07/20/2016 0930   AST 27 07/20/2016 0930   ALT 16 07/20/2016 0930   BILITOT 0.62 07/20/2016 0930       RADIOGRAPHIC STUDIES: No results found.  ASSESSMENT AND PLAN:  This is a very pleasant 83 years old white female with recurrent non-small cell lung cancer, adenocarcinoma with positive EGFR mutation in exon 21. The patient is currently  on treatment with Iressa 250 mg by mouth daily and tolerating it fairly well. I recommended for her to proceed with her treatment as a scheduled. I will see her back for follow-up visit in one month's for reevaluation with repeat CT scan of the chest for restaging of her disease. For diarrhea she will continue on Imodium on as-needed basis. The patient was advised to call immediately if she has any concerning symptoms in the interval. The patient voices understanding of current disease status and treatment options and is in agreement with the current care plan. All questions were answered. The patient knows to call the clinic with any problems, questions or concerns. We can certainly see the patient much sooner if necessary. I spent 10 minutes counseling the patient face to face. The total time spent in the appointment was 15 minutes.   Disclaimer: This note was dictated with voice recognition software. Similar sounding words can inadvertently be transcribed and may not be corrected upon review.

## 2016-07-24 ENCOUNTER — Other Ambulatory Visit: Payer: Self-pay | Admitting: Cardiology

## 2016-07-31 ENCOUNTER — Telehealth: Payer: Self-pay | Admitting: Pharmacist

## 2016-07-31 NOTE — Telephone Encounter (Signed)
Pt called [facility] re: Iressa refill.  She is enrolled w/ [employer] program and thought she would receive her Iressa last week but it did not come and she has to be at home to receive the shipment.  She is worried she won't be home when it arrives and has many obligations this week. I called [employer] and they stated it should arrive in 3-5 business days.  They would not give me a specific date.  I called pt back w/ this info.  This is pts last refill.  She will need another refill before the end of this month.  Kennith Center, Pharm.D., CPP 07/31/2016@10 :09 AM [facility]

## 2016-08-01 DIAGNOSIS — K5100 Ulcerative (chronic) pancolitis without complications: Secondary | ICD-10-CM | POA: Diagnosis not present

## 2016-08-01 DIAGNOSIS — Z6820 Body mass index (BMI) 20.0-20.9, adult: Secondary | ICD-10-CM | POA: Diagnosis not present

## 2016-08-01 DIAGNOSIS — Z23 Encounter for immunization: Secondary | ICD-10-CM | POA: Diagnosis not present

## 2016-08-01 DIAGNOSIS — I482 Chronic atrial fibrillation: Secondary | ICD-10-CM | POA: Diagnosis not present

## 2016-08-01 DIAGNOSIS — I5032 Chronic diastolic (congestive) heart failure: Secondary | ICD-10-CM | POA: Diagnosis not present

## 2016-08-04 ENCOUNTER — Telehealth: Payer: Self-pay | Admitting: Medical Oncology

## 2016-08-04 NOTE — Telephone Encounter (Signed)
Fax confirmed , Iressa shipped to pt.

## 2016-08-15 ENCOUNTER — Ambulatory Visit (INDEPENDENT_AMBULATORY_CARE_PROVIDER_SITE_OTHER): Payer: Medicare Other | Admitting: *Deleted

## 2016-08-15 DIAGNOSIS — Z5181 Encounter for therapeutic drug level monitoring: Secondary | ICD-10-CM

## 2016-08-15 DIAGNOSIS — I4891 Unspecified atrial fibrillation: Secondary | ICD-10-CM

## 2016-08-15 LAB — POCT INR: INR: 2.2

## 2016-08-16 ENCOUNTER — Telehealth: Payer: Self-pay | Admitting: Cardiology

## 2016-08-16 NOTE — Telephone Encounter (Signed)
Left message for patient to call and schedule appointment from [facility] report 2 yr fu Cartoid Dopplers   per [facility] REPORT  - last Cartoid Study was 07/09/2014  Mailed letter to patient.

## 2016-08-23 ENCOUNTER — Ambulatory Visit (HOSPITAL_COMMUNITY)
Admission: RE | Admit: 2016-08-23 | Discharge: 2016-08-23 | Disposition: A | Discharge status: Home / Self Care | Payer: Medicare Other | Source: Ambulatory Visit | Attending: Internal Medicine | Admitting: Internal Medicine

## 2016-08-23 DIAGNOSIS — Z9221 Personal history of antineoplastic chemotherapy: Secondary | ICD-10-CM | POA: Insufficient documentation

## 2016-08-23 DIAGNOSIS — R197 Diarrhea, unspecified: Secondary | ICD-10-CM

## 2016-08-23 DIAGNOSIS — C3491 Malignant neoplasm of unspecified part of right bronchus or lung: Secondary | ICD-10-CM | POA: Diagnosis not present

## 2016-08-23 DIAGNOSIS — I517 Cardiomegaly: Secondary | ICD-10-CM | POA: Diagnosis not present

## 2016-08-23 DIAGNOSIS — Z5181 Encounter for therapeutic drug level monitoring: Secondary | ICD-10-CM

## 2016-08-23 DIAGNOSIS — C3411 Malignant neoplasm of upper lobe, right bronchus or lung: Secondary | ICD-10-CM | POA: Diagnosis not present

## 2016-08-23 DIAGNOSIS — C3492 Malignant neoplasm of unspecified part of left bronchus or lung: Secondary | ICD-10-CM | POA: Diagnosis not present

## 2016-08-23 DIAGNOSIS — I2510 Atherosclerotic heart disease of native coronary artery without angina pectoris: Secondary | ICD-10-CM | POA: Diagnosis not present

## 2016-08-23 DIAGNOSIS — I700 Atherosclerosis of aorta: Secondary | ICD-10-CM | POA: Insufficient documentation

## 2016-08-23 MED ORDER — IOPAMIDOL (ISOVUE-300) INJECTION 61%
75.0000 mL | Freq: Once | INTRAVENOUS | Status: AC | PRN
  Administered 2016-08-23: 75 mL via INTRAVENOUS

## 2016-08-28 ENCOUNTER — Ambulatory Visit (HOSPITAL_BASED_OUTPATIENT_CLINIC_OR_DEPARTMENT_OTHER): Payer: Medicare Other | Admitting: Internal Medicine

## 2016-08-28 ENCOUNTER — Other Ambulatory Visit (HOSPITAL_BASED_OUTPATIENT_CLINIC_OR_DEPARTMENT_OTHER): Payer: Medicare Other

## 2016-08-28 ENCOUNTER — Telehealth: Payer: Self-pay | Admitting: Internal Medicine

## 2016-08-28 ENCOUNTER — Encounter: Payer: Self-pay | Admitting: Internal Medicine

## 2016-08-28 VITALS — BP 120/68 | HR 60 | Temp 97.4°F | Resp 17 | Ht 63.0 in | Wt 112.6 lb

## 2016-08-28 DIAGNOSIS — R197 Diarrhea, unspecified: Secondary | ICD-10-CM | POA: Diagnosis not present

## 2016-08-28 DIAGNOSIS — C3411 Malignant neoplasm of upper lobe, right bronchus or lung: Secondary | ICD-10-CM

## 2016-08-28 DIAGNOSIS — L270 Generalized skin eruption due to drugs and medicaments taken internally: Secondary | ICD-10-CM

## 2016-08-28 DIAGNOSIS — Z5181 Encounter for therapeutic drug level monitoring: Secondary | ICD-10-CM

## 2016-08-28 LAB — CBC WITH DIFFERENTIAL/PLATELET
BASO%: 1.6 % (ref 0.0–2.0)
BASOS ABS: 0.1 10*3/uL (ref 0.0–0.1)
EOS ABS: 0.2 10*3/uL (ref 0.0–0.5)
EOS%: 4.8 % (ref 0.0–7.0)
HEMATOCRIT: 43.8 % (ref 34.8–46.6)
HEMOGLOBIN: 14.6 g/dL (ref 11.6–15.9)
LYMPH#: 1 10*3/uL (ref 0.9–3.3)
LYMPH%: 28.5 % (ref 14.0–49.7)
MCH: 30.3 pg (ref 25.1–34.0)
MCHC: 33.3 g/dL (ref 31.5–36.0)
MCV: 91 fL (ref 79.5–101.0)
MONO#: 0.5 10*3/uL (ref 0.1–0.9)
MONO%: 13.4 % (ref 0.0–14.0)
NEUT#: 1.8 10*3/uL (ref 1.5–6.5)
NEUT%: 51.7 % (ref 38.4–76.8)
PLATELETS: 233 10*3/uL (ref 145–400)
RBC: 4.81 10*6/uL (ref 3.70–5.45)
RDW: 14.2 % (ref 11.2–14.5)
WBC: 3.4 10*3/uL — ABNORMAL LOW (ref 3.9–10.3)

## 2016-08-28 LAB — COMPREHENSIVE METABOLIC PANEL
ALBUMIN: 3.6 g/dL (ref 3.5–5.0)
ALT: 16 U/L (ref 0–55)
AST: 25 U/L (ref 5–34)
Alkaline Phosphatase: 101 U/L (ref 40–150)
Anion Gap: 7 mEq/L (ref 3–11)
BUN: 12.7 mg/dL (ref 7.0–26.0)
CHLORIDE: 105 meq/L (ref 98–109)
CO2: 29 meq/L (ref 22–29)
Calcium: 9.1 mg/dL (ref 8.4–10.4)
Creatinine: 0.7 mg/dL (ref 0.6–1.1)
EGFR: 75 mL/min/{1.73_m2} — ABNORMAL LOW (ref >90)
GLUCOSE: 106 mg/dL (ref 70–140)
POTASSIUM: 4.1 meq/L (ref 3.5–5.1)
SODIUM: 141 meq/L (ref 136–145)
Total Bilirubin: 0.82 mg/dL (ref 0.20–1.20)
Total Protein: 6.7 g/dL (ref 6.4–8.3)

## 2016-08-28 NOTE — Telephone Encounter (Signed)
Lab and follow up appointments scheduled per 08/28/16 los. Patient was given a copy of the AVS report and appointment schedule per 08/28/16 los.

## 2016-08-28 NOTE — Progress Notes (Signed)
[facility] Telephone:(336) [phone]   Fax:(336) [phone]  OFFICE PROGRESS NOTE  Gar Ponto, MD [place] [address]  DIAGNOSIS: Recurrent non-small cell lung cancer presented with bilateral lung nodules in October 2016 initially diagnosed as a stage IA adenocarcinoma with positive EGFR mutation with deletion in exon 78 in December 2012.  PRIOR THERAPY: Status post bronchoscopy with right VATS and right upper lobectomy with lymph node dissection under the care of Dr. Servando Snare on 05/31/2011.  CURRENT THERAPY: Iressa 250 mg by mouth daily started 04/17/2015, status post 16 months of treatment.  INTERVAL HISTORY: Natalie Carlson 83 y.o. female returns to the clinic today for follow-up visit accompanied by her friend. The patient is feeling fine today with no specific complaints. She has been tolerating her treatment with Iressa fairly well with no significant side effect except for mild skin rash and occasional diarrhea. She denied having any weight loss or night sweats. She has no nausea, vomiting or constipation. She denied having any chest pain, shortness of breath, cough or hemoptysis. She has no fever or chills. She is here today for evaluation with repeat CT scan of the chest for restaging of her disease.   MEDICAL HISTORY: Past Medical History:  Diagnosis Date  . Arthritis   . Atrial fibrillation ([place])   . Cataract of both eyes   . Chronic back pain   . Chronic diarrhea   . Colitis   . Hemorrhoids   . History of colon polyps   . History of migraines   . Hyperlipidemia   . Hypothyroidism   . Macular degeneration, dry   . Malignant neoplasm of right upper lobe of lung (HCC)    Stage 1A  EGFR positive  Deletion in exon 19.  ALK-negative  Right Upper Lobe Lung Mass  SUV 2.9 on PET Status post VATS and right upper lobe lobectomy 05/31/2011  . Mucus in stool   . Nephrolithiasis    1994 - passed on its on    ALLERGIES:  is allergic to doxycycline;  entocort ec [budesonide]; neurontin [gabapentin]; vicodin [hydrocodone-acetaminophen]; adhesive [tape]; amoxicillin; chocolate; codeine; levaquin [levofloxacin in d5w]; morphine and related; naprosyn [naproxen]; penicillins; prednisone; tramadol; and uncoded nonscreenable allergen.  MEDICATIONS:  Current Outpatient Prescriptions  Medication Sig Dispense Refill  . acetaminophen (TYLENOL) 325 MG tablet Take 650 mg by mouth every 6 (six) hours as needed for moderate pain. Reported on 06/02/2015    . ALPRAZolam (XANAX) 0.5 MG tablet Take 0.5 mg by mouth every 8 (eight) hours as needed for anxiety. Reported on 06/02/2015    . APRISO 0.375 g 24 hr capsule TAKE 2 CAPSULE BY MOUTH TWICE DAILY 120 capsule 6  . Cholecalciferol 2000 UNITS CAPS Take 2,000 Units by mouth daily.     . diclofenac sodium (VOLTAREN) 1 % GEL Apply 1 application topically 2 (two) times daily as needed (for neck pain).     Marland Kitchen diltiazem (CARDIZEM CD) 180 MG 24 hr capsule TAKE 1 CAPSULE BY MOUTH EVERY DAY (DOSE INCREASE) 30 capsule 6  . gefitinib (IRESSA) 250 MG tablet Take 1 tablet (250 mg total) by mouth daily. 30 tablet 2  . LUTEIN PO Take 1 capsule by mouth daily. Reported on 10/27/2015    . Multiple Vitamins-Minerals (AIRBORNE) CHEW Chew 1 tablet by mouth daily.    . Multiple Vitamins-Minerals (HAIR/SKIN/NAILS) TABS Take 1 tablet by mouth daily.    . Omega-3 Fatty Acids (FISH OIL) 1200 MG CAPS Take  1,200 mg by mouth daily.    . Polyvinyl Alcohol-Povidone (REFRESH OP) Place 1 drop into both eyes as needed (for dry eyes).    . potassium chloride (K-DUR,KLOR-CON) 10 MEQ tablet Take 1 tablet (10 mEq total) by mouth daily. 30 tablet 6  . warfarin (COUMADIN) 5 MG tablet Take 1 tablet daily except 1 1/2 tablets on Tuesdays and Fridays 35 tablet 4  . furosemide (LASIX) 20 MG tablet TAKE 1 TABLET BY MOUTH EVERY DAY AS NEEDED FOR FLUID (Patient not taking: Reported on 07/20/2016) 30 tablet 5  . ondansetron (ZOFRAN) 4 MG tablet Take 1 tablet (4  mg total) by mouth every 8 (eight) hours as needed for nausea or vomiting. (Patient not taking: Reported on 05/18/2016) 20 tablet 0   No current facility-administered medications for this visit.     SURGICAL HISTORY:  Past Surgical History:  Procedure Laterality Date  . Bladder tack  1996  . CARDIOVERSION  11/02/09   x 2  . CATARACT EXTRACTION  2010/2011  . COLONOSCOPY  3 YRS AGO  . COLONOSCOPY  03/06/2011   Procedure: COLONOSCOPY;  Surgeon: Rogene Houston, MD;  [facility];  Service: Endoscopy;  Laterality: N/A;  7:30 am  . ESOPHAGOGASTRODUODENOSCOPY    . FLEXIBLE SIGMOIDOSCOPY N/A 10/04/2012   Procedure: FLEXIBLE SIGMOIDOSCOPY;  Surgeon: Rogene Houston, MD;  [facility];  Service: Endoscopy;  Laterality: N/A;  11:00-moved to 1015 Ann to notify pt  . THYROID SURGERY  1961  . TONSILLECTOMY    . VIDEO BRONCHOSCOPY  05/31/2011   Procedure: VIDEO BRONCHOSCOPY;  Surgeon: Grace Isaac, MD;  Location: [facility] OR;  Service: Thoracic;  Laterality: N/A;    REVIEW OF SYSTEMS:  Constitutional: negative Eyes: negative Ears, nose, mouth, throat, and face: negative Respiratory: negative Cardiovascular: negative Gastrointestinal: negative Genitourinary:negative Integument/breast: negative Hematologic/lymphatic: negative Musculoskeletal:negative Neurological: negative Behavioral/Psych: negative Endocrine: negative Allergic/Immunologic: negative   PHYSICAL EXAMINATION: General appearance: alert, cooperative and no distress Head: Normocephalic, without obvious abnormality, atraumatic Neck: no adenopathy, no JVD, supple, symmetrical, trachea midline and thyroid not enlarged, symmetric, no tenderness/mass/nodules Lymph nodes: Cervical, supraclavicular, and axillary nodes normal. Resp: clear to auscultation bilaterally Back: symmetric, no curvature. ROM normal. No CVA tenderness. Cardio: regular rate and rhythm, S1, S2 normal, no murmur, click, rub or gallop GI: soft,  non-tender; bowel sounds normal; no masses,  no organomegaly Extremities: extremities normal, atraumatic, no cyanosis or edema Neurologic: Alert and oriented X 3, normal strength and tone. Normal symmetric reflexes. Normal coordination and gait  ECOG PERFORMANCE STATUS: 0 - Asymptomatic  Blood pressure 120/68, pulse 60, temperature 97.4 F (36.3 C), temperature source Oral, resp. rate 17, height _0  (1.6 m), weight 112 lb 9.6 oz (51.1 kg), SpO2 98 %.  LABORATORY DATA: Lab Results  Component Value Date   WBC 3.4 (L) 08/28/2016   HGB 14.6 08/28/2016   HCT 43.8 08/28/2016   MCV 91.0 08/28/2016   PLT 233 08/28/2016      Chemistry      Component Value Date/Time   NA 141 08/28/2016 1250   K 4.1 08/28/2016 1250   CL 102 01/07/2014 1636   CO2 29 08/28/2016 1250   BUN 12.7 08/28/2016 1250   CREATININE 0.7 08/28/2016 1250      Component Value Date/Time   CALCIUM 9.1 08/28/2016 1250   ALKPHOS 101 08/28/2016 1250   AST 25 08/28/2016 1250   ALT 16 08/28/2016 1250   BILITOT 0.82 08/28/2016 1250  RADIOGRAPHIC STUDIES: Ct Chest W Contrast  Result Date: 08/24/2016 CLINICAL DATA:  Status post right upper lobectomy 05/31/2011 for stage IA lung adenocarcinoma, with bilateral lung recurrence October 2016, with ongoing chemotherapy. Restaging. EXAM: CT CHEST WITH CONTRAST TECHNIQUE: Multidetector CT imaging of the chest was performed during intravenous contrast administration. CONTRAST:  17m ISOVUE-300 IOPAMIDOL (ISOVUE-300) INJECTION 61% COMPARISON:  04/22/2016 chest CT. FINDINGS: Cardiovascular: Mild cardiomegaly. No significant pericardial fluid/thickening. Left anterior descending and right coronary atherosclerosis. Atherosclerotic nonaneurysmal thoracic aorta. Normal caliber pulmonary arteries. No central pulmonary emboli. Mediastinum/Nodes: Stable multinodular goiter with dominant 1.8 cm heterogeneous hypodense right thyroid lobe nodule. Unremarkable esophagus. No pathologically  enlarged axillary, mediastinal or hilar lymph nodes. Lungs/Pleura: No pneumothorax. No pleural effusion. Status post right upper lobectomy. No acute consolidative airspace disease. Scattered small solid pulmonary nodules throughout both lungs measuring up to the 5 mm in the anterior right lower lobe (series 4/ image 103) and 8 mm in the apical left upper lobe (series 4/ image 18) are all unchanged. Perifissural nodularity along the right major fissure measuring up to 6 mm thickness (series 4/ image 32) is unchanged. No new significant pulmonary nodules. Interlobular septal thickening in both lungs, basilar predominant, unchanged. Upper abdomen: Unremarkable. Musculoskeletal: No aggressive appearing focal osseous lesions. Mild-to-moderate thoracic spondylosis. IMPRESSION: 1. No new or progressive metastatic disease in the chest. 2. Stable scattered pulmonary nodularity, perifissural nodularity and basilar predominant interlobular septal thickening. 3. Additional findings include stable mild cardiomegaly, aortic atherosclerosis and 2 vessel coronary atherosclerosis. Electronically Signed   By: JIlona SorrelM.D.   On: 08/24/2016 08:26    ASSESSMENT AND PLAN:  This is a very pleasant 83years old white female with recurrent non-small cell lung cancer, adenocarcinoma with positive EGFR mutation exon 21. The patient is currently on treatment with Iressa 250 mg by mouth daily and has been tolerating this treatment fairly well. She had a recent CT scan of the chest. I personally and independently reviewed the scan and discuss the results with the patient and her friend today. Her scan showed no evidence for disease progression. I recommended for the patient to continue her current treatment with Iressa with the same dose. I will see her back for follow-up visit in one month's for reevaluation with repeat blood work. For the skin rash she will continue to use clindamycin lotion as needed and for the diarrhea she  will continue on Imodium. She was advised to call immediately if she has any concerning symptoms in the interval. The patient voices understanding of current disease status and treatment options and is in agreement with the current care plan. All questions were answered. The patient knows to call the clinic with any problems, questions or concerns. We can certainly see the patient much sooner if necessary. Disclaimer: This note was dictated with voice recognition software. Similar sounding words can inadvertently be transcribed and may not be corrected upon review.

## 2016-08-31 ENCOUNTER — Other Ambulatory Visit: Payer: Self-pay | Admitting: *Deleted

## 2016-08-31 DIAGNOSIS — C3490 Malignant neoplasm of unspecified part of unspecified bronchus or lung: Secondary | ICD-10-CM

## 2016-08-31 MED ORDER — GEFITINIB 250 MG PO TABS: 250.0000 mg | ORAL_TABLET | Freq: Every day | ORAL | 0 refills | Status: DC

## 2016-09-20 DIAGNOSIS — S00261A Insect bite (nonvenomous) of right eyelid and periocular area, initial encounter: Secondary | ICD-10-CM | POA: Diagnosis not present

## 2016-09-20 DIAGNOSIS — Z6820 Body mass index (BMI) 20.0-20.9, adult: Secondary | ICD-10-CM | POA: Diagnosis not present

## 2016-09-22 ENCOUNTER — Telehealth: Payer: Self-pay | Admitting: *Deleted

## 2016-09-22 NOTE — Telephone Encounter (Signed)
Returned call to pt and advised her that there is no interaction between her Triderm and Coumadin. She did not have any further questions.

## 2016-09-22 NOTE — Telephone Encounter (Signed)
Pt is using Triderm and would like to make sure it will not interfere w/ her coumadin

## 2016-09-26 ENCOUNTER — Encounter (HOSPITAL_COMMUNITY): Payer: Self-pay | Admitting: Cardiology

## 2016-09-26 ENCOUNTER — Emergency Department (HOSPITAL_COMMUNITY): Payer: Medicare Other

## 2016-09-26 ENCOUNTER — Emergency Department (HOSPITAL_COMMUNITY)
Admission: EM | Admit: 2016-09-26 | Discharge: 2016-09-26 | Disposition: A | Discharge status: Home / Self Care | Payer: Medicare Other | Attending: Emergency Medicine | Admitting: Emergency Medicine

## 2016-09-26 DIAGNOSIS — R1031 Right lower quadrant pain: Secondary | ICD-10-CM | POA: Diagnosis present

## 2016-09-26 DIAGNOSIS — N201 Calculus of ureter: Secondary | ICD-10-CM

## 2016-09-26 DIAGNOSIS — R109 Unspecified abdominal pain: Secondary | ICD-10-CM

## 2016-09-26 DIAGNOSIS — E039 Hypothyroidism, unspecified: Secondary | ICD-10-CM | POA: Diagnosis not present

## 2016-09-26 DIAGNOSIS — Z7901 Long term (current) use of anticoagulants: Secondary | ICD-10-CM | POA: Insufficient documentation

## 2016-09-26 DIAGNOSIS — Z79899 Other long term (current) drug therapy: Secondary | ICD-10-CM | POA: Diagnosis not present

## 2016-09-26 DIAGNOSIS — N200 Calculus of kidney: Secondary | ICD-10-CM | POA: Diagnosis not present

## 2016-09-26 DIAGNOSIS — N132 Hydronephrosis with renal and ureteral calculous obstruction: Secondary | ICD-10-CM | POA: Diagnosis not present

## 2016-09-26 LAB — CBC WITH DIFFERENTIAL/PLATELET
BASOS ABS: 0 10*3/uL (ref 0.0–0.1)
Basophils Relative: 1 %
Eosinophils Absolute: 0.2 10*3/uL (ref 0.0–0.7)
Eosinophils Relative: 5 %
HCT: 46 % (ref 36.0–46.0)
Hemoglobin: 15.4 g/dL — ABNORMAL HIGH (ref 12.0–15.0)
LYMPHS ABS: 1 10*3/uL (ref 0.7–4.0)
Lymphocytes Relative: 23 %
MCH: 30.7 pg (ref 26.0–34.0)
MCHC: 33.5 g/dL (ref 30.0–36.0)
MCV: 91.6 fL (ref 78.0–100.0)
MONO ABS: 0.5 10*3/uL (ref 0.1–1.0)
Monocytes Relative: 11 %
NEUTROS ABS: 2.6 10*3/uL (ref 1.7–7.7)
Neutrophils Relative %: 60 %
Platelets: 261 10*3/uL (ref 150–400)
RBC: 5.02 MIL/uL (ref 3.87–5.11)
RDW: 14 % (ref 11.5–15.5)
WBC: 4.2 10*3/uL (ref 4.0–10.5)

## 2016-09-26 LAB — BASIC METABOLIC PANEL
ANION GAP: 9 (ref 5–15)
BUN: 15 mg/dL (ref 6–20)
CO2: 27 mmol/L (ref 22–32)
Calcium: 9.1 mg/dL (ref 8.9–10.3)
Chloride: 102 mmol/L (ref 101–111)
Creatinine, Ser: 0.77 mg/dL (ref 0.44–1.00)
GFR calc Af Amer: >60 mL/min (ref >60)
GFR calc non Af Amer: >60 mL/min (ref >60)
GLUCOSE: 134 mg/dL — AB (ref 65–99)
POTASSIUM: 3.6 mmol/L (ref 3.5–5.1)
Sodium: 138 mmol/L (ref 135–145)

## 2016-09-26 LAB — URINALYSIS, ROUTINE W REFLEX MICROSCOPIC
BILIRUBIN URINE: NEGATIVE
Bacteria, UA: NONE SEEN
Glucose, UA: NEGATIVE mg/dL
KETONES UR: NEGATIVE mg/dL
Leukocytes, UA: NEGATIVE
Nitrite: NEGATIVE
PROTEIN: 30 mg/dL — AB
Specific Gravity, Urine: 1.012 (ref 1.005–1.030)
pH: 7 (ref 5.0–8.0)

## 2016-09-26 LAB — HEPATIC FUNCTION PANEL
ALK PHOS: 99 U/L (ref 38–126)
ALT: 17 U/L (ref 14–54)
AST: 30 U/L (ref 15–41)
Albumin: 4.2 g/dL (ref 3.5–5.0)
BILIRUBIN INDIRECT: 0.7 mg/dL (ref 0.3–0.9)
Bilirubin, Direct: 0.1 mg/dL (ref 0.1–0.5)
TOTAL PROTEIN: 7.5 g/dL (ref 6.5–8.1)
Total Bilirubin: 0.8 mg/dL (ref 0.3–1.2)

## 2016-09-26 LAB — PROTIME-INR
INR: 2.3
PROTHROMBIN TIME: 25.7 s — AB (ref 11.4–15.2)

## 2016-09-26 MED ORDER — ONDANSETRON HCL 4 MG/2ML IJ SOLN
4.0000 mg | Freq: Once | INTRAMUSCULAR | Status: AC
  Administered 2016-09-26: 4 mg via INTRAVENOUS
  Filled 2016-09-26: qty 2

## 2016-09-26 MED ORDER — HYDROMORPHONE HCL 4 MG PO TABS: 4.0000 mg | ORAL_TABLET | Freq: Four times a day (QID) | ORAL | 0 refills | Status: DC | PRN

## 2016-09-26 MED ORDER — FENTANYL CITRATE (PF) 100 MCG/2ML IJ SOLN
25.0000 ug | Freq: Once | INTRAMUSCULAR | Status: AC
  Administered 2016-09-26: 25 ug via INTRAVENOUS

## 2016-09-26 MED ORDER — SODIUM CHLORIDE 0.9 % IV BOLUS (SEPSIS)
250.0000 mL | Freq: Once | INTRAVENOUS | Status: AC
  Administered 2016-09-26: 250 mL via INTRAVENOUS

## 2016-09-26 MED ORDER — ONDANSETRON HCL 4 MG/2ML IJ SOLN
4.0000 mg | Freq: Once | INTRAMUSCULAR | Status: AC
Start: 2016-09-26 — End: 2016-09-26
  Administered 2016-09-26: 4 mg via INTRAVENOUS
  Filled 2016-09-26: qty 2

## 2016-09-26 MED ORDER — SODIUM CHLORIDE 0.9 % IV SOLN: INTRAVENOUS | Status: DC

## 2016-09-26 MED ORDER — ONDANSETRON 4 MG PO TBDP: 4.0000 mg | ORAL_TABLET | Freq: Three times a day (TID) | ORAL | 1 refills | Status: DC | PRN

## 2016-09-26 MED ORDER — FENTANYL CITRATE (PF) 100 MCG/2ML IJ SOLN
25.0000 ug | Freq: Once | INTRAMUSCULAR | Status: AC
  Administered 2016-09-26: 25 ug via INTRAVENOUS
  Filled 2016-09-26: qty 2

## 2016-09-26 NOTE — Discharge Instructions (Addendum)
Follow-up with urology referral information provided above. They do have office hours appears well that you can request. Return for any new or worse symptoms or if not improving in one to 2 days. Take the antinausea medicine and the pain medicine as directed. CT scan showed a 3 mm stone at the end of the ureter near the bladder on the right side.

## 2016-09-26 NOTE — ED Notes (Signed)
Pt returned from xray

## 2016-09-26 NOTE — ED Notes (Signed)
Pt taken to ct 

## 2016-09-26 NOTE — ED Notes (Signed)
Pt dry heaving

## 2016-09-26 NOTE — ED Triage Notes (Signed)
Right flank pain since 430am.

## 2016-09-26 NOTE — ED Provider Notes (Signed)
[facility] DEPT Provider Note   CSN: [id number] Arrival date & time: 09/26/16  9509     History   Chief Complaint Chief Complaint  Patient presents with  . Flank Pain    HPI Natalie Carlson is a 83 y.o. female.  Patient with acute onset of right flank pain right lower quadrant abdominal pain at 4:30 this morning. Patient with past history of kidney stones. Symptoms remind patient of previous kidney stone. Associated with the nausea and vomiting. Pain is 10 out of 10. Comes in waves. No blood in the urine.      Past Medical History:  Diagnosis Date  . Arthritis   . Atrial fibrillation ([place])   . Cataract of both eyes   . Chronic back pain   . Chronic diarrhea   . Colitis   . Hemorrhoids   . History of colon polyps   . History of migraines   . Hyperlipidemia   . Hypothyroidism   . Macular degeneration, dry   . Malignant neoplasm of right upper lobe of lung (HCC)    Stage 1A  EGFR positive  Deletion in exon 19.  ALK-negative  Right Upper Lobe Lung Mass  SUV 2.9 on PET Status post VATS and right upper lobe lobectomy 05/31/2011  . Mucus in stool   . Nephrolithiasis    1994 - passed on its on    Patient Active Problem List   Diagnosis Date Noted  . Drug-induced skin rash 05/05/2015  . Chronic diastolic heart failure ([place]) 01/14/2014  . Encounter for therapeutic drug monitoring 06/13/2013  . Carotid bruit 06/13/2013  . Rectosigmoiditis 11/25/2012  . Diarrhea 09/25/2012  . Rectal discharge 09/09/2012  . RLQ abdominal pain 09/09/2012  . Malignant neoplasm of right upper lobe of lung ([place])   . Long term current use of anticoagulant 07/29/2010  . Atrial fibrillation ([place]) 07/29/2009    Past Surgical History:  Procedure Laterality Date  . Bladder tack  1996  . CARDIOVERSION  11/02/09   x 2  . CATARACT EXTRACTION  2010/2011  . COLONOSCOPY  3 YRS AGO  . COLONOSCOPY  03/06/2011   Procedure: COLONOSCOPY;  Surgeon: Rogene Houston, MD;  Location: [facility];   Service: Endoscopy;  Laterality: N/A;  7:30 am  . ESOPHAGOGASTRODUODENOSCOPY    . FLEXIBLE SIGMOIDOSCOPY N/A 10/04/2012   Procedure: FLEXIBLE SIGMOIDOSCOPY;  Surgeon: Rogene Houston, MD;  Location: [facility];  Service: Endoscopy;  Laterality: N/A;  11:00-moved to [address]5 [address] to notify pt  . THYROID SURGERY  1961  . TONSILLECTOMY    . VIDEO BRONCHOSCOPY  05/31/2011   Procedure: VIDEO BRONCHOSCOPY;  Surgeon: Grace Isaac, MD;  Location: [facility] OR;  Service: Thoracic;  Laterality: N/A;    OB History    No data available       Home Medications    Prior to Admission medications   Medication Sig Start Date End Date Taking? Authorizing Provider  APRISO 0.375 g 24 hr capsule TAKE 2 CAPSULE BY MOUTH TWICE DAILY 06/27/16  Yes Setzer, Terri L, NP  Cholecalciferol 2000 UNITS CAPS Take 2,000 Units by mouth daily.    Yes [provider]  diltiazem (CARDIZEM CD) 180 MG 24 hr capsule TAKE 1 CAPSULE BY MOUTH EVERY DAY (DOSE INCREASE) 03/24/16  Yes Satira Sark, MD  gefitinib (IRESSA) 250 MG tablet Take 1 tablet (250 mg total) by mouth daily. 08/31/16  Yes Curt Bears, MD  LUTEIN PO Take 1 capsule by mouth daily. Reported  on 10/27/2015   Yes [provider]  Multiple Vitamins-Minerals (AIRBORNE) CHEW Chew 1 tablet by mouth daily.   Yes [provider]  Multiple Vitamins-Minerals (HAIR/SKIN/NAILS) TABS Take 1 tablet by mouth daily.   Yes [provider]  Omega-3 Fatty Acids (FISH OIL) 1200 MG CAPS Take 1,200 mg by mouth daily.   Yes [provider]  Polyvinyl Alcohol-Povidone (REFRESH OP) Place 1 drop into both eyes as needed (for dry eyes).   Yes [provider]  potassium chloride (K-DUR,KLOR-CON) 10 MEQ tablet Take 1 tablet (10 mEq total) by mouth daily. 07/22/15  Yes Satira Sark, MD  triamcinolone cream (KENALOG) 0.1 % APPLY TO THE AFFECTED AREA(S) THREE TIMES DAILY FOR ONE WEEK 09/20/16  Yes [provider]  warfarin  (COUMADIN) 5 MG tablet Take 1 tablet daily except 1 1/2 tablets on Tuesdays and Fridays 07/24/16  Yes Satira Sark, MD  diclofenac sodium (VOLTAREN) 1 % GEL Apply 1 application topically 2 (two) times daily as needed (for neck pain).     [provider]  furosemide (LASIX) 20 MG tablet TAKE 1 TABLET BY MOUTH EVERY DAY AS NEEDED FOR FLUID Patient taking differently: TAKE 1 TABLET BY MOUTH EVERY DAY AS NEEDED FOR FLUID (Takes Tueday, Thursday, and Saturday) 05/23/16   Satira Sark, MD  HYDROmorphone (DILAUDID) 4 MG tablet Take 1 tablet (4 mg total) by mouth every 6 (six) hours as needed for severe pain. 09/26/16   Fredia Sorrow, MD  ondansetron (ZOFRAN ODT) 4 MG disintegrating tablet Take 1 tablet (4 mg total) by mouth every 8 (eight) hours as needed. 09/26/16   Fredia Sorrow, MD  ondansetron (ZOFRAN) 4 MG tablet Take 1 tablet (4 mg total) by mouth every 8 (eight) hours as needed for nausea or vomiting. Patient not taking: Reported on 05/18/2016 05/05/15   Elease Hashimoto    Family History Family History  Problem Relation Age of Onset  . Stroke Mother   . Stroke Father   . Anesthesia problems Sister   . Stroke Other        two siblings  . Heart disease Other        sibling  . Liver disease Paternal Grandmother   . Hypotension Neg Hx   . Malignant hyperthermia Neg Hx   . Pseudochol deficiency Neg Hx     Social History Social History  Substance Use Topics  . Smoking status: Never Smoker  . Smokeless tobacco: Never Used  . Alcohol use No     Allergies   Doxycycline; Entocort ec [budesonide]; Neurontin [gabapentin]; Vicodin [hydrocodone-acetaminophen]; Adhesive [tape]; Amoxicillin; Chocolate; Codeine; Levaquin [levofloxacin in d5w]; Morphine and related; Naprosyn [naproxen]; Penicillins; Prednisone; Tramadol; and Uncoded nonscreenable allergen   Review of Systems Review of Systems  Constitutional: Negative for fever.  HENT: Negative for congestion.     Eyes: Negative for visual disturbance.  Respiratory: Negative for shortness of breath.   Cardiovascular: Negative for chest pain.  Gastrointestinal: Positive for abdominal pain, nausea and vomiting.  Genitourinary: Positive for flank pain. Negative for dysuria and hematuria.  Musculoskeletal: Positive for back pain.  Neurological: Negative for headaches.  Hematological: Bruises/bleeds easily.  Psychiatric/Behavioral: Negative for confusion.     Physical Exam Updated Vital Signs BP 139/72 (BP Location: Right Arm)   Pulse 80   Temp 97.5 F (36.4 C)   Resp 18   Ht 5' 3"  (1.6 m)   Wt 50.8 kg   SpO2 96%   BMI 19.84 kg/m  Physical Exam  Constitutional: She is oriented to person, place, and time. She appears well-developed and well-nourished. No distress.  HENT:  Head: Normocephalic and atraumatic.  Mouth/Throat: Oropharynx is clear and moist.  Eyes: Conjunctivae and EOM are normal. Pupils are equal, round, and reactive to light.  Neck: Normal range of motion. Neck supple.  Cardiovascular: Normal rate and regular rhythm.   Pulmonary/Chest: Effort normal and breath sounds normal. No respiratory distress.  Abdominal: Soft. Bowel sounds are normal. There is tenderness.  Periumbilical area no mass.  Neurological: She is alert and oriented to person, place, and time. No cranial nerve deficit or sensory deficit. She exhibits normal muscle tone. Coordination normal.  Skin: Skin is warm.  Nursing note and vitals reviewed.    ED Treatments / Results  Labs (all labs ordered are listed, but only abnormal results are displayed) Labs Reviewed  CBC WITH DIFFERENTIAL/PLATELET - Abnormal; Notable for the following:       Result Value   Hemoglobin 15.4 (*)    All other components within normal limits  BASIC METABOLIC PANEL - Abnormal; Notable for the following:    Glucose, Bld 134 (*)    All other components within normal limits  URINALYSIS, ROUTINE W REFLEX MICROSCOPIC - Abnormal;  Notable for the following:    Hgb urine dipstick SMALL (*)    Protein, ur 30 (*)    Squamous Epithelial / LPF 0-5 (*)    All other components within normal limits  PROTIME-INR - Abnormal; Notable for the following:    Prothrombin Time 25.7 (*)    All other components within normal limits  HEPATIC FUNCTION PANEL    EKG  EKG Interpretation None       Radiology Ct Renal Stone Study  Result Date: 09/26/2016 CLINICAL DATA:  Right flank pain EXAM: CT ABDOMEN AND PELVIS WITHOUT CONTRAST TECHNIQUE: Multidetector CT imaging of the abdomen and pelvis was performed following the standard protocol without oral or intravenous contrast material administration. COMPARISON:  May 15, 2015 FINDINGS: Lower chest: There is fibrotic change in lung bases. No lung base edema or consolidation. There is cardiomegaly. Visualized pericardium appears unremarkable. Hepatobiliary: No focal liver lesions are evident on this noncontrast enhanced study. Gallbladder is contracted. There is no biliary duct dilatation evident. Pancreas: There is no pancreatic mass or inflammatory focus. Spleen: No splenic lesions are evident. Adrenals/Urinary Tract: Adrenals appear normal bilaterally. The right kidney is edematous. There is no well-defined renal mass on either side. There is severe hydronephrosis on the right. There is no appreciable hydronephrosis on the left. There is a 1 mm calculus in the anterior mid right kidney. On the left, there is a 1 mm calculus in the mid kidney. More inferiorly in the mid to lower pole region there is a 3 x 2 mm calculus on the left. There are several 1 mm calculi in the lower pole left kidney. On the right, there is a 3 mm calculus at the ureterovesical junction. There are several phleboliths in the pelvis which are near but felt to be separate from the right ureter. No ureteral calculi are evident on the left. Urinary bladder is midline with wall thickness within normal limits. Stomach/Bowel:  There are scattered colonic diverticular without diverticulitis. There is moderate stool throughout colon. There is no bowel wall or mesenteric thickening. No bowel obstruction. No free air or portal venous air. Vascular/Lymphatic: There is extensive atherosclerotic calcification in the aorta without demonstrable aneurysm. There is mild calcification in the common iliac arteries.  Major mesenteric vessels appear patent. There is no evident adenopathy in the abdomen or pelvis. Reproductive: Uterus is retroverted. There are multiple calcifications throughout the uterus consistent with widespread leiomyomatous change. There is no pelvic mass apart from the uterine leiomyomas. Other: The appendix region appears normal. No abscess apparent in the abdomen or pelvis. There is mild ascites, primarily in the right upper abdomen. Musculoskeletal: There is extensive arthropathy in the lumbar spine with thoracolumbar levoscoliosis. There is spinal stenosis at L2-3, L3-4, and L4-5 due to bony hypertrophy and diffuse disc protrusion at these levels. There are no blastic or lytic bone lesions. There is no intramuscular or abdominal wall lesions. IMPRESSION: 3 mm calculus at the right ureterovesical junction with severe hydronephrosis on the right. Right kidney is edematous. There are small intrarenal calculi bilaterally, more on the left than on the right. No hydronephrosis or ureteral calculus on the left. Mild ascites. No abscess. No peri appendiceal region inflammation. No bowel obstruction. Fibrotic type change in lung bases. Extensive aortic atherosclerosis without aneurysm. Leiomyoma which uterus with multiple calcified uterine leiomyomas throughout the uterus. Extensive lumbar arthropathy. Spinal stenosis is noted at L2-3, L3-4, and L4-5, multifactorial. Electronically Signed   By: Lowella Grip III M.D.   On: 09/26/2016 08:47    Procedures Procedures (including critical care time)  Medications Ordered in  ED Medications  0.9 %  sodium chloride infusion ( Intravenous Stopped 09/26/16 1104)  fentaNYL (SUBLIMAZE) injection 25 mcg (not administered)  ondansetron (ZOFRAN) injection 4 mg (4 mg Intravenous Given 09/26/16 0732)  sodium chloride 0.9 % bolus 250 mL (0 mLs Intravenous Stopped 09/26/16 0833)  ondansetron (ZOFRAN) injection 4 mg (4 mg Intravenous Given 09/26/16 0813)  fentaNYL (SUBLIMAZE) injection 25 mcg (25 mcg Intravenous Given 09/26/16 0914)     Initial Impression / Assessment and Plan / ED Course  I have reviewed the triage vital signs and the nursing notes.  Pertinent labs & imaging results that were available during my care of the patient were reviewed by me and considered in my medical decision making (see chart for details).     CT scan confirms a distal right ureter stone at the junction of the latter. Planning patient's symptoms. Renal functions normal. There is evidence on CT scan of hydronephrosis. Patient improved here with fentanyl and Zofran.  Final Clinical Impressions(s) / ED Diagnoses   Final diagnoses:  Right flank pain  Right ureteral stone    New Prescriptions New Prescriptions   HYDROMORPHONE (DILAUDID) 4 MG TABLET    Take 1 tablet (4 mg total) by mouth every 6 (six) hours as needed for severe pain.   ONDANSETRON (ZOFRAN ODT) 4 MG DISINTEGRATING TABLET    Take 1 tablet (4 mg total) by mouth every 8 (eight) hours as needed.     Fredia Sorrow, MD 09/26/16 1115

## 2016-09-26 NOTE — ED Notes (Signed)
edp aware of pt's restlessness

## 2016-09-27 ENCOUNTER — Telehealth: Payer: Self-pay | Admitting: Internal Medicine

## 2016-09-27 ENCOUNTER — Encounter: Payer: Self-pay | Admitting: Internal Medicine

## 2016-09-27 ENCOUNTER — Ambulatory Visit (HOSPITAL_BASED_OUTPATIENT_CLINIC_OR_DEPARTMENT_OTHER): Payer: Medicare Other | Admitting: Internal Medicine

## 2016-09-27 ENCOUNTER — Other Ambulatory Visit (HOSPITAL_BASED_OUTPATIENT_CLINIC_OR_DEPARTMENT_OTHER): Payer: Medicare Other

## 2016-09-27 VITALS — BP 127/54 | HR 62 | Temp 98.0°F | Resp 18 | Ht 63.0 in | Wt 114.5 lb

## 2016-09-27 DIAGNOSIS — L270 Generalized skin eruption due to drugs and medicaments taken internally: Secondary | ICD-10-CM

## 2016-09-27 DIAGNOSIS — C3411 Malignant neoplasm of upper lobe, right bronchus or lung: Secondary | ICD-10-CM

## 2016-09-27 DIAGNOSIS — Z5181 Encounter for therapeutic drug level monitoring: Secondary | ICD-10-CM

## 2016-09-27 LAB — CBC WITH DIFFERENTIAL/PLATELET
BASO%: 1 % (ref 0.0–2.0)
BASOS ABS: 0 10*3/uL (ref 0.0–0.1)
EOS%: 2.3 % (ref 0.0–7.0)
Eosinophils Absolute: 0.1 10*3/uL (ref 0.0–0.5)
HEMATOCRIT: 41.1 % (ref 34.8–46.6)
HEMOGLOBIN: 13.6 g/dL (ref 11.6–15.9)
LYMPH#: 1.3 10*3/uL (ref 0.9–3.3)
LYMPH%: 27.9 % (ref 14.0–49.7)
MCH: 30.6 pg (ref 25.1–34.0)
MCHC: 33 g/dL (ref 31.5–36.0)
MCV: 92.6 fL (ref 79.5–101.0)
MONO#: 0.6 10*3/uL (ref 0.1–0.9)
MONO%: 13.8 % (ref 0.0–14.0)
NEUT#: 2.6 10*3/uL (ref 1.5–6.5)
NEUT%: 55 % (ref 38.4–76.8)
PLATELETS: 226 10*3/uL (ref 145–400)
RBC: 4.44 10*6/uL (ref 3.70–5.45)
RDW: 14.1 % (ref 11.2–14.5)
WBC: 4.7 10*3/uL (ref 3.9–10.3)

## 2016-09-27 LAB — COMPREHENSIVE METABOLIC PANEL
ALBUMIN: 3.6 g/dL (ref 3.5–5.0)
ALK PHOS: 89 U/L (ref 40–150)
ALT: 16 U/L (ref 0–55)
ANION GAP: 5 meq/L (ref 3–11)
AST: 28 U/L (ref 5–34)
BUN: 16.9 mg/dL (ref 7.0–26.0)
CALCIUM: 9 mg/dL (ref 8.4–10.4)
CHLORIDE: 107 meq/L (ref 98–109)
CO2: 28 mEq/L (ref 22–29)
Creatinine: 0.8 mg/dL (ref 0.6–1.1)
EGFR: 69 mL/min/{1.73_m2} — ABNORMAL LOW (ref >90)
Glucose: 100 mg/dl (ref 70–140)
Potassium: 3.8 mEq/L (ref 3.5–5.1)
Sodium: 140 mEq/L (ref 136–145)
Total Bilirubin: 0.65 mg/dL (ref 0.20–1.20)
Total Protein: 6.7 g/dL (ref 6.4–8.3)

## 2016-09-27 NOTE — Telephone Encounter (Signed)
Appointments scheduled per 09/27/16 los. Patient was given a copy of the AVS report and appointment schedule per 09/27/16 los.

## 2016-09-27 NOTE — Progress Notes (Signed)
Natalie Carlson [phone]) [phone]   Fax:([phone]  OFFICE PROGRESS NOTE  [name], MD [place] [address]  DIAGNOSIS: Recurrent non-small cell lung cancer presented with bilateral lung nodules in October 2016 initially diagnosed as a stage IA adenocarcinoma with positive EGFR mutation with deletion in exon 49 in December 2012.  PRIOR THERAPY: Status post bronchoscopy with right VATS and right upper lobectomy with lymph node dissection under the care of Dr. Servando Snare on 05/31/2011.  CURRENT THERAPY: Iressa 250 mg by mouth daily started 04/17/2015, status post 17 months of treatment.  INTERVAL HISTORY: Natalie Carlson 83 y.o. female returns to the clinic today for follow-up visit accompanied by her friend. The patient is feeling fine today was no specific complaints. She had a right ureteric stone yesterday and she was seen at the emergency department and was treated with IV fluids as well as pain medication and anti-emetics. She passed the stone at home. She denied having any current fever or chills. She has no nausea, vomiting, diarrhea or constipation. She has no chest pain, shortness of breath, cough or hemoptysis.  The patient is here today for evaluation and repeat blood work.   MEDICAL HISTORY: Past Medical History:  Diagnosis Date  . Arthritis   . Atrial fibrillation ([place])   . Cataract of both eyes   . Chronic back pain   . Chronic diarrhea   . Colitis   . Hemorrhoids   . History of colon polyps   . History of migraines   . Hyperlipidemia   . Hypothyroidism   . Macular degeneration, dry   . Malignant neoplasm of right upper lobe of lung (HCC)    Stage 1A  EGFR positive  Deletion in exon 19.  ALK-negative  Right Upper Lobe Lung Mass  SUV 2.9 on PET Status post VATS and right upper lobe lobectomy 05/31/2011  . Mucus in stool   . Nephrolithiasis    1994 - passed on its on    ALLERGIES:  is allergic to doxycycline; entocort ec  [budesonide]; neurontin [gabapentin]; vicodin [hydrocodone-acetaminophen]; adhesive [tape]; amoxicillin; chocolate; codeine; levaquin [levofloxacin in d5w]; morphine and related; naprosyn [naproxen]; penicillins; prednisone; tramadol; and uncoded nonscreenable allergen.  MEDICATIONS:  Current Outpatient Prescriptions  Medication Sig Dispense Refill  . APRISO 0.375 g 24 hr capsule TAKE 2 CAPSULE BY MOUTH TWICE DAILY 120 capsule 6  . Cholecalciferol 2000 UNITS CAPS Take 2,000 Units by mouth daily.     . diclofenac sodium (VOLTAREN) 1 % GEL Apply 1 application topically 2 (two) times daily as needed (for neck pain).     Marland Kitchen diltiazem (CARDIZEM CD) 180 MG 24 hr capsule TAKE 1 CAPSULE BY MOUTH EVERY DAY (DOSE INCREASE) 30 capsule 6  . furosemide (LASIX) 20 MG tablet TAKE 1 TABLET BY MOUTH EVERY DAY AS NEEDED FOR FLUID (Patient taking differently: TAKE 1 TABLET BY MOUTH EVERY DAY AS NEEDED FOR FLUID (Takes Tueday, Thursday, and Saturday)) 30 tablet 5  . gefitinib (IRESSA) 250 MG tablet Take 1 tablet (250 mg total) by mouth daily. 30 tablet 0  . HYDROmorphone (DILAUDID) 4 MG tablet Take 1 tablet (4 mg total) by mouth every 6 (six) hours as needed for severe pain. 20 tablet 0  . LUTEIN PO Take 1 capsule by mouth daily. Reported on 10/27/2015    . Multiple Vitamins-Minerals (AIRBORNE) CHEW Chew 1 tablet by mouth daily.    . Multiple Vitamins-Minerals (HAIR/SKIN/NAILS) TABS Take 1 tablet by  mouth daily.    . Omega-3 Fatty Acids (FISH OIL) 1200 MG CAPS Take 1,200 mg by mouth daily.    . ondansetron (ZOFRAN ODT) 4 MG disintegrating tablet Take 1 tablet (4 mg total) by mouth every 8 (eight) hours as needed. 10 tablet 1  . ondansetron (ZOFRAN) 4 MG tablet Take 1 tablet (4 mg total) by mouth every 8 (eight) hours as needed for nausea or vomiting. (Patient not taking: Reported on 05/18/2016) 20 tablet 0  . Polyvinyl Alcohol-Povidone (REFRESH OP) Place 1 drop into both eyes as needed (for dry eyes).    . potassium  chloride (K-DUR,KLOR-CON) 10 MEQ tablet Take 1 tablet (10 mEq total) by mouth daily. 30 tablet 6  . triamcinolone cream (KENALOG) 0.1 % APPLY TO THE AFFECTED AREA(S) THREE TIMES DAILY FOR ONE WEEK  0  . warfarin (COUMADIN) 5 MG tablet Take 1 tablet daily except 1 1/2 tablets on Tuesdays and Fridays 35 tablet 4   No current facility-administered medications for this visit.     SURGICAL HISTORY:  Past Surgical History:  Procedure Laterality Date  . Bladder tack  1996  . CARDIOVERSION  11/02/09   x 2  . CATARACT EXTRACTION  2010/2011  . COLONOSCOPY  3 YRS AGO  . COLONOSCOPY  03/06/2011   Procedure: COLONOSCOPY;  Surgeon: Rogene Houston, MD;  [facility];  Service: Endoscopy;  Laterality: N/A;  7:30 am  . ESOPHAGOGASTRODUODENOSCOPY    . FLEXIBLE SIGMOIDOSCOPY N/A 10/04/2012   Procedure: FLEXIBLE SIGMOIDOSCOPY;  Surgeon: Rogene Houston, MD;  [facility];  Service: Endoscopy;  Laterality: N/A;  11:00-moved to 1015 Ann to notify pt  . THYROID SURGERY  1961  . TONSILLECTOMY    . VIDEO BRONCHOSCOPY  05/31/2011   Procedure: VIDEO BRONCHOSCOPY;  Surgeon: Grace Isaac, MD;  Location: [facility] OR;  Service: Thoracic;  Laterality: N/A;    REVIEW OF SYSTEMS:  A comprehensive review of systems was negative.   PHYSICAL EXAMINATION: General appearance: alert, cooperative and no distress Head: Normocephalic, without obvious abnormality, atraumatic Neck: no adenopathy, no JVD, supple, symmetrical, trachea midline and thyroid not enlarged, symmetric, no tenderness/mass/nodules Lymph nodes: Cervical, supraclavicular, and axillary nodes normal. Resp: clear to auscultation bilaterally Back: symmetric, no curvature. ROM normal. No CVA tenderness. Cardio: regular rate and rhythm, S1, S2 normal, no murmur, click, rub or gallop GI: soft, non-tender; bowel sounds normal; no masses,  no organomegaly Extremities: extremities normal, atraumatic, no cyanosis or edema  ECOG PERFORMANCE  STATUS: 0 - Asymptomatic  Blood pressure (!) 127/54, pulse 62, temperature 98 F (36.7 C), temperature source Oral, resp. rate 18, height 5' 3"  (1.6 m), weight 114 lb 8 oz (51.9 kg), SpO2 98 %.  LABORATORY DATA: Lab Results  Component Value Date   WBC 4.7 09/27/2016   HGB 13.6 09/27/2016   HCT 41.1 09/27/2016   MCV 92.6 09/27/2016   PLT 226 09/27/2016      Chemistry      Component Value Date/Time   NA 138 09/26/2016 0720   NA 141 08/28/2016 1250   K 3.6 09/26/2016 0720   K 4.1 08/28/2016 1250   CL 102 09/26/2016 0720   CO2 27 09/26/2016 0720   CO2 29 08/28/2016 1250   BUN 15 09/26/2016 0720   BUN 12.7 08/28/2016 1250   CREATININE 0.77 09/26/2016 0720   CREATININE 0.7 08/28/2016 1250      Component Value Date/Time   CALCIUM 9.1 09/26/2016 0720   CALCIUM 9.1 08/28/2016 1250  ALKPHOS 99 09/26/2016 0720   ALKPHOS 101 08/28/2016 1250   AST 30 09/26/2016 0720   AST 25 08/28/2016 1250   ALT 17 09/26/2016 0720   ALT 16 08/28/2016 1250   BILITOT 0.8 09/26/2016 0720   BILITOT 0.82 08/28/2016 1250       RADIOGRAPHIC STUDIES: Ct Renal Stone Study  Result Date: 09/26/2016 CLINICAL DATA:  Right flank pain EXAM: CT ABDOMEN AND PELVIS WITHOUT CONTRAST TECHNIQUE: Multidetector CT imaging of the abdomen and pelvis was performed following the standard protocol without oral or intravenous contrast material administration. COMPARISON:  May 15, 2015 FINDINGS: Lower chest: There is fibrotic change in lung bases. No lung base edema or consolidation. There is cardiomegaly. Visualized pericardium appears unremarkable. Hepatobiliary: No focal liver lesions are evident on this noncontrast enhanced study. Gallbladder is contracted. There is no biliary duct dilatation evident. Pancreas: There is no pancreatic mass or inflammatory focus. Spleen: No splenic lesions are evident. Adrenals/Urinary Tract: Adrenals appear normal bilaterally. The right kidney is edematous. There is no well-defined  renal mass on either side. There is severe hydronephrosis on the right. There is no appreciable hydronephrosis on the left. There is a 1 mm calculus in the anterior mid right kidney. On the left, there is a 1 mm calculus in the mid kidney. More inferiorly in the mid to lower pole region there is a 3 x 2 mm calculus on the left. There are several 1 mm calculi in the lower pole left kidney. On the right, there is a 3 mm calculus at the ureterovesical junction. There are several phleboliths in the pelvis which are near but felt to be separate from the right ureter. No ureteral calculi are evident on the left. Urinary bladder is midline with wall thickness within normal limits. Stomach/Bowel: There are scattered colonic diverticular without diverticulitis. There is moderate stool throughout colon. There is no bowel wall or mesenteric thickening. No bowel obstruction. No free air or portal venous air. Vascular/Lymphatic: There is extensive atherosclerotic calcification in the aorta without demonstrable aneurysm. There is mild calcification in the common iliac arteries. Major mesenteric vessels appear patent. There is no evident adenopathy in the abdomen or pelvis. Reproductive: Uterus is retroverted. There are multiple calcifications throughout the uterus consistent with widespread leiomyomatous change. There is no pelvic mass apart from the uterine leiomyomas. Other: The appendix region appears normal. No abscess apparent in the abdomen or pelvis. There is mild ascites, primarily in the right upper abdomen. Musculoskeletal: There is extensive arthropathy in the lumbar spine with thoracolumbar levoscoliosis. There is spinal stenosis at L2-3, L3-4, and L4-5 due to bony hypertrophy and diffuse disc protrusion at these levels. There are no blastic or lytic bone lesions. There is no intramuscular or abdominal wall lesions. IMPRESSION: 3 mm calculus at the right ureterovesical junction with severe hydronephrosis on the right.  Right kidney is edematous. There are small intrarenal calculi bilaterally, more on the left than on the right. No hydronephrosis or ureteral calculus on the left. Mild ascites. No abscess. No peri appendiceal region inflammation. No bowel obstruction. Fibrotic type change in lung bases. Extensive aortic atherosclerosis without aneurysm. Leiomyoma which uterus with multiple calcified uterine leiomyomas throughout the uterus. Extensive lumbar arthropathy. Spinal stenosis is noted at L2-3, L3-4, and L4-5, multifactorial. Electronically Signed   By: Lowella Grip III M.D.   On: 09/26/2016 08:47    ASSESSMENT AND PLAN:  This is a very pleasant 83 years old white female with recurrent non-small cell lung cancer, adenocarcinoma with  positive EGFR mutation in exon 19. She is currently undergoing treatment with Iressa 250 mg by mouth daily status post 17 months of treatment and has been tolerating her treatment fairly well. I recommended for the patient to continue her treatment with Iressa.  I will see her back for follow-up visit in one month for evaluation after repeating CBC and comprehensive metabolic panel. She was advised to call immediately if she has any concerning symptoms in the interval. The patient voices understanding of current disease status and treatment options and is in agreement with the current care plan. All questions were answered. The patient knows to call the clinic with any problems, questions or concerns. We can certainly see the patient much sooner if necessary. I spent 10 minutes counseling the patient face to face. The total time spent in the appointment was 15 minutes.  Disclaimer: This note was dictated with voice recognition software. Similar sounding words can inadvertently be transcribed and may not be corrected upon review.

## 2016-10-11 ENCOUNTER — Other Ambulatory Visit: Payer: Self-pay | Admitting: Medical Oncology

## 2016-10-11 DIAGNOSIS — C3490 Malignant neoplasm of unspecified part of unspecified bronchus or lung: Secondary | ICD-10-CM

## 2016-10-11 MED ORDER — GEFITINIB 250 MG PO TABS: 250.0000 mg | ORAL_TABLET | Freq: Every day | ORAL | 1 refills | Status: DC

## 2016-10-11 NOTE — Plan of Care (Signed)
iressa refill faxed to [place] [facility] [place].

## 2016-10-13 ENCOUNTER — Other Ambulatory Visit: Payer: Self-pay | Admitting: Cardiology

## 2016-10-25 ENCOUNTER — Encounter (INDEPENDENT_AMBULATORY_CARE_PROVIDER_SITE_OTHER): Payer: Self-pay | Admitting: Internal Medicine

## 2016-10-26 ENCOUNTER — Ambulatory Visit (INDEPENDENT_AMBULATORY_CARE_PROVIDER_SITE_OTHER): Payer: Medicare Other | Admitting: *Deleted

## 2016-10-26 DIAGNOSIS — I4891 Unspecified atrial fibrillation: Secondary | ICD-10-CM | POA: Diagnosis not present

## 2016-10-26 DIAGNOSIS — Z5181 Encounter for therapeutic drug level monitoring: Secondary | ICD-10-CM

## 2016-10-26 LAB — POCT INR: INR: 3

## 2016-10-31 ENCOUNTER — Ambulatory Visit (HOSPITAL_BASED_OUTPATIENT_CLINIC_OR_DEPARTMENT_OTHER): Payer: Medicare Other | Admitting: Internal Medicine

## 2016-10-31 ENCOUNTER — Encounter: Payer: Self-pay | Admitting: Internal Medicine

## 2016-10-31 ENCOUNTER — Telehealth: Payer: Self-pay | Admitting: Internal Medicine

## 2016-10-31 ENCOUNTER — Other Ambulatory Visit (HOSPITAL_BASED_OUTPATIENT_CLINIC_OR_DEPARTMENT_OTHER): Payer: Medicare Other

## 2016-10-31 VITALS — BP 116/58 | HR 105 | Temp 97.8°F | Resp 16 | Ht 63.0 in | Wt 112.2 lb

## 2016-10-31 DIAGNOSIS — C3411 Malignant neoplasm of upper lobe, right bronchus or lung: Secondary | ICD-10-CM

## 2016-10-31 DIAGNOSIS — Z5181 Encounter for therapeutic drug level monitoring: Secondary | ICD-10-CM

## 2016-10-31 LAB — CBC WITH DIFFERENTIAL/PLATELET
BASO%: 0.5 % (ref 0.0–2.0)
BASOS ABS: 0 10*3/uL (ref 0.0–0.1)
EOS ABS: 0.2 10*3/uL (ref 0.0–0.5)
EOS%: 4 % (ref 0.0–7.0)
HCT: 43.3 % (ref 34.8–46.6)
HGB: 14.2 g/dL (ref 11.6–15.9)
LYMPH#: 1.1 10*3/uL (ref 0.9–3.3)
LYMPH%: 26.6 % (ref 14.0–49.7)
MCH: 30.4 pg (ref 25.1–34.0)
MCHC: 32.8 g/dL (ref 31.5–36.0)
MCV: 92.7 fL (ref 79.5–101.0)
MONO#: 0.6 10*3/uL (ref 0.1–0.9)
MONO%: 14.5 % — ABNORMAL HIGH (ref 0.0–14.0)
NEUT#: 2.3 10*3/uL (ref 1.5–6.5)
NEUT%: 54.4 % (ref 38.4–76.8)
PLATELETS: 247 10*3/uL (ref 145–400)
RBC: 4.67 10*6/uL (ref 3.70–5.45)
RDW: 14 % (ref 11.2–14.5)
WBC: 4.2 10*3/uL (ref 3.9–10.3)

## 2016-10-31 LAB — COMPREHENSIVE METABOLIC PANEL
ALT: 14 U/L (ref 0–55)
ANION GAP: 8 meq/L (ref 3–11)
AST: 25 U/L (ref 5–34)
Albumin: 3.6 g/dL (ref 3.5–5.0)
Alkaline Phosphatase: 95 U/L (ref 40–150)
BUN: 13.3 mg/dL (ref 7.0–26.0)
CALCIUM: 9.4 mg/dL (ref 8.4–10.4)
CO2: 30 meq/L — AB (ref 22–29)
CREATININE: 0.8 mg/dL (ref 0.6–1.1)
Chloride: 105 mEq/L (ref 98–109)
EGFR: 68 mL/min/{1.73_m2} — AB (ref >90)
Glucose: 105 mg/dl (ref 70–140)
Potassium: 3.9 mEq/L (ref 3.5–5.1)
Sodium: 144 mEq/L (ref 136–145)
Total Bilirubin: 0.66 mg/dL (ref 0.20–1.20)
Total Protein: 6.9 g/dL (ref 6.4–8.3)

## 2016-10-31 NOTE — Telephone Encounter (Signed)
Scheduled appt per 6/19 los - Gave patient AVS and calender per LOS - [facility] to contact patient with ct schedule.

## 2016-10-31 NOTE — Progress Notes (Signed)
[place] [phone]   Fax:(336) [phone]  OFFICE PROGRESS NOTE  [name], MD [place] [address]  DIAGNOSIS: Recurrent non-small cell lung cancer presented with bilateral lung nodules in October 2016 initially diagnosed as a stage IA adenocarcinoma with positive EGFR mutation with deletion in exon 58 in December 2012.  PRIOR THERAPY: Status post bronchoscopy with right VATS and right upper lobectomy with lymph node dissection under the care of Dr. Servando Snare on 05/31/2011.  CURRENT THERAPY: Iressa 250 mg by mouth daily started 04/17/2015, status post 18 months of treatment.  INTERVAL HISTORY: Natalie Carlson 83 y.o. female returns to the clinic today for follow-up visit accompanied by her friend. The patient is feeling fine today with no specific complaints except for some scabs in her head. She denied having any chest pain, shortness of breath, cough or hemoptysis. She denied having any fever or chills. She has no nausea, vomiting, diarrhea or constipation. She is tolerating her treatment with Iressa fairly well. She is here today for evaluation and repeat blood work.  MEDICAL HISTORY: Past Medical History:  Diagnosis Date  . Arthritis   . Atrial fibrillation ([place])   . Cataract of both eyes   . Chronic back pain   . Chronic diarrhea   . Colitis   . Hemorrhoids   . History of colon polyps   . History of migraines   . Hyperlipidemia   . Hypothyroidism   . Macular degeneration, dry   . Malignant neoplasm of right upper lobe of lung (HCC)    Stage 1A  EGFR positive  Deletion in exon 19.  ALK-negative  Right Upper Lobe Lung Mass  SUV 2.9 on PET Status post VATS and right upper lobe lobectomy 05/31/2011  . Mucus in stool   . Nephrolithiasis    1994 - passed on its on    ALLERGIES:  is allergic to doxycycline; entocort ec [budesonide]; neurontin [gabapentin]; vicodin [hydrocodone-acetaminophen]; adhesive [tape]; amoxicillin;  chocolate; codeine; levaquin [levofloxacin in d5w]; morphine and related; naprosyn [naproxen]; penicillins; prednisone; tramadol; and uncoded nonscreenable allergen.  MEDICATIONS:  Current Outpatient Prescriptions  Medication Sig Dispense Refill  . APRISO 0.375 g 24 hr capsule TAKE 2 CAPSULE BY MOUTH TWICE DAILY 120 capsule 6  . Cholecalciferol 2000 UNITS CAPS Take 2,000 Units by mouth daily.     . diclofenac sodium (VOLTAREN) 1 % GEL Apply 1 application topically 2 (two) times daily as needed (for neck pain).     Marland Kitchen diltiazem (CARDIZEM CD) 180 MG 24 hr capsule TAKE 1 CAPSULE BY MOUTH EVERY DAY (DOSE INCREASE) 30 capsule 6  . furosemide (LASIX) 20 MG tablet TAKE 1 TABLET BY MOUTH EVERY DAY AS NEEDED FOR FLUID (Patient taking differently: TAKE 1 TABLET BY MOUTH EVERY DAY AS NEEDED FOR FLUID (Takes Tueday, Thursday, and Saturday)) 30 tablet 5  . gefitinib (IRESSA) 250 MG tablet Take 1 tablet (250 mg total) by mouth daily. 30 tablet 1  . HYDROmorphone (DILAUDID) 4 MG tablet Take 1 tablet (4 mg total) by mouth every 6 (six) hours as needed for severe pain. 20 tablet 0  . LUTEIN PO Take 1 capsule by mouth daily. Reported on 10/27/2015    . Multiple Vitamins-Minerals (AIRBORNE) CHEW Chew 1 tablet by mouth daily.    . Multiple Vitamins-Minerals (HAIR/SKIN/NAILS) TABS Take 1 tablet by mouth daily.    . Omega-3 Fatty Acids (FISH OIL) 1200 MG CAPS Take 1,200 mg by mouth daily.    Marland Kitchen  ondansetron (ZOFRAN ODT) 4 MG disintegrating tablet Take 1 tablet (4 mg total) by mouth every 8 (eight) hours as needed. 10 tablet 1  . ondansetron (ZOFRAN) 4 MG tablet Take 1 tablet (4 mg total) by mouth every 8 (eight) hours as needed for nausea or vomiting. (Patient not taking: Reported on 05/18/2016) 20 tablet 0  . Polyvinyl Alcohol-Povidone (REFRESH OP) Place 1 drop into both eyes as needed (for dry eyes).    . potassium chloride (K-DUR,KLOR-CON) 10 MEQ tablet Take 1 tablet (10 mEq total) by mouth daily. 30 tablet 6  .  triamcinolone cream (KENALOG) 0.1 % APPLY TO THE AFFECTED AREA(S) THREE TIMES DAILY FOR ONE WEEK  0  . warfarin (COUMADIN) 5 MG tablet Take 1 tablet daily except 1 1/2 tablets on Tuesdays and Fridays 35 tablet 4   No current facility-administered medications for this visit.     SURGICAL HISTORY:  Past Surgical History:  Procedure Laterality Date  . Bladder tack  1996  . CARDIOVERSION  11/02/09   x 2  . CATARACT EXTRACTION  2010/2011  . COLONOSCOPY  3 YRS AGO  . COLONOSCOPY  03/06/2011   Procedure: COLONOSCOPY;  Surgeon: Rogene Houston, MD;  [facility];  Service: Endoscopy;  Laterality: N/A;  7:30 am  . ESOPHAGOGASTRODUODENOSCOPY    . FLEXIBLE SIGMOIDOSCOPY N/A 10/04/2012   Procedure: FLEXIBLE SIGMOIDOSCOPY;  Surgeon: Rogene Houston, MD;  [facility];  Service: Endoscopy;  Laterality: N/A;  11:00-moved to 1015 Ann to notify pt  . THYROID SURGERY  1961  . TONSILLECTOMY    . VIDEO BRONCHOSCOPY  05/31/2011   Procedure: VIDEO BRONCHOSCOPY;  Surgeon: Grace Isaac, MD;  Location: [facility] OR;  Service: Thoracic;  Laterality: N/A;    REVIEW OF SYSTEMS:  A comprehensive review of systems was negative except for: Integument/breast: positive for rash   PHYSICAL EXAMINATION: General appearance: alert, cooperative and no distress Head: Normocephalic, without obvious abnormality, atraumatic Neck: no adenopathy, no JVD, supple, symmetrical, trachea midline and thyroid not enlarged, symmetric, no tenderness/mass/nodules Lymph nodes: Cervical, supraclavicular, and axillary nodes normal. Resp: clear to auscultation bilaterally Back: symmetric, no curvature. ROM normal. No CVA tenderness. Cardio: regular rate and rhythm, S1, S2 normal, no murmur, click, rub or gallop GI: soft, non-tender; bowel sounds normal; no masses,  no organomegaly Extremities: extremities normal, atraumatic, no cyanosis or edema  ECOG PERFORMANCE STATUS: 0 - Asymptomatic  Blood pressure (!) 116/58,  pulse (!) 105, temperature 97.8 F (36.6 C), temperature source Oral, resp. rate 16, height 5' 3"  (1.6 m), weight 112 lb 3.2 oz (50.9 kg), SpO2 100 %.  LABORATORY DATA: Lab Results  Component Value Date   WBC 4.2 10/31/2016   HGB 14.2 10/31/2016   HCT 43.3 10/31/2016   MCV 92.7 10/31/2016   PLT 247 10/31/2016      Chemistry      Component Value Date/Time   NA 140 09/27/2016 1332   K 3.8 09/27/2016 1332   CL 102 09/26/2016 0720   CO2 28 09/27/2016 1332   BUN 16.9 09/27/2016 1332   CREATININE 0.8 09/27/2016 1332      Component Value Date/Time   CALCIUM 9.0 09/27/2016 1332   ALKPHOS 89 09/27/2016 1332   AST 28 09/27/2016 1332   ALT 16 09/27/2016 1332   BILITOT 0.65 09/27/2016 1332       RADIOGRAPHIC STUDIES: No results found.  ASSESSMENT AND PLAN:  This is a very pleasant 83 years old white female with recurrent non-small  cell lung cancer, adenocarcinoma with positive EGFR mutation. She is currently on treatment with Iressa 250 mg by mouth daily status post 18 months of treatment. She is tolerating the treatment well. I recommended for the patient to continue her treatment with Iressa with the same dose. I will see her back for follow-up visit in one month for reevaluation with repeat CT scan of the chest for restaging of her disease. She was advised to call immediately if she has any concerning symptoms in the interval. The patient voices understanding of current disease status and treatment options and is in agreement with the current care plan. All questions were answered. The patient knows to call the clinic with any problems, questions or concerns. We can certainly see the patient much sooner if necessary. I spent 10 minutes counseling the patient face to face. The total time spent in the appointment was 15 minutes.  Disclaimer: This note was dictated with voice recognition software. Similar sounding words can inadvertently be transcribed and may not be corrected upon  review.

## 2016-11-20 DIAGNOSIS — K5100 Ulcerative (chronic) pancolitis without complications: Secondary | ICD-10-CM | POA: Diagnosis not present

## 2016-11-20 DIAGNOSIS — I5032 Chronic diastolic (congestive) heart failure: Secondary | ICD-10-CM | POA: Diagnosis not present

## 2016-11-20 DIAGNOSIS — I482 Chronic atrial fibrillation: Secondary | ICD-10-CM | POA: Diagnosis not present

## 2016-11-20 DIAGNOSIS — C3411 Malignant neoplasm of upper lobe, right bronchus or lung: Secondary | ICD-10-CM | POA: Diagnosis not present

## 2016-11-20 DIAGNOSIS — Z681 Body mass index (BMI) 19 or less, adult: Secondary | ICD-10-CM | POA: Diagnosis not present

## 2016-11-28 ENCOUNTER — Encounter (HOSPITAL_COMMUNITY): Payer: Self-pay

## 2016-11-28 ENCOUNTER — Ambulatory Visit (HOSPITAL_COMMUNITY)
Admission: RE | Admit: 2016-11-28 | Discharge: 2016-11-28 | Disposition: A | Discharge status: Home / Self Care | Payer: Medicare Other | Source: Ambulatory Visit | Attending: Internal Medicine | Admitting: Internal Medicine

## 2016-11-28 DIAGNOSIS — C3411 Malignant neoplasm of upper lobe, right bronchus or lung: Secondary | ICD-10-CM

## 2016-11-28 DIAGNOSIS — Z85118 Personal history of other malignant neoplasm of bronchus and lung: Secondary | ICD-10-CM | POA: Insufficient documentation

## 2016-11-28 DIAGNOSIS — Z5181 Encounter for therapeutic drug level monitoring: Secondary | ICD-10-CM

## 2016-11-28 DIAGNOSIS — R918 Other nonspecific abnormal finding of lung field: Secondary | ICD-10-CM | POA: Diagnosis not present

## 2016-11-28 DIAGNOSIS — I700 Atherosclerosis of aorta: Secondary | ICD-10-CM | POA: Diagnosis not present

## 2016-11-28 DIAGNOSIS — E042 Nontoxic multinodular goiter: Secondary | ICD-10-CM | POA: Insufficient documentation

## 2016-11-28 MED ORDER — IOPAMIDOL (ISOVUE-300) INJECTION 61%
75.0000 mL | Freq: Once | INTRAVENOUS | Status: AC | PRN
  Administered 2016-11-28: 75 mL via INTRAVENOUS

## 2016-11-30 ENCOUNTER — Encounter: Payer: Self-pay | Admitting: Internal Medicine

## 2016-11-30 ENCOUNTER — Other Ambulatory Visit (HOSPITAL_BASED_OUTPATIENT_CLINIC_OR_DEPARTMENT_OTHER): Payer: Medicare Other

## 2016-11-30 ENCOUNTER — Ambulatory Visit (HOSPITAL_BASED_OUTPATIENT_CLINIC_OR_DEPARTMENT_OTHER): Payer: Medicare Other | Admitting: Internal Medicine

## 2016-11-30 VITALS — BP 133/66 | HR 72 | Temp 97.9°F | Resp 17 | Ht 63.0 in | Wt 111.0 lb

## 2016-11-30 DIAGNOSIS — C3411 Malignant neoplasm of upper lobe, right bronchus or lung: Secondary | ICD-10-CM | POA: Diagnosis not present

## 2016-11-30 DIAGNOSIS — Z5181 Encounter for therapeutic drug level monitoring: Secondary | ICD-10-CM

## 2016-11-30 LAB — COMPREHENSIVE METABOLIC PANEL
ALBUMIN: 3.6 g/dL (ref 3.5–5.0)
ALT: 14 U/L (ref 0–55)
ANION GAP: 8 meq/L (ref 3–11)
AST: 24 U/L (ref 5–34)
Alkaline Phosphatase: 97 U/L (ref 40–150)
BILIRUBIN TOTAL: 0.7 mg/dL (ref 0.20–1.20)
BUN: 10.8 mg/dL (ref 7.0–26.0)
CO2: 27 meq/L (ref 22–29)
Calcium: 9.1 mg/dL (ref 8.4–10.4)
Chloride: 103 mEq/L (ref 98–109)
Creatinine: 0.8 mg/dL (ref 0.6–1.1)
EGFR: 73 mL/min/{1.73_m2} — AB (ref >90)
Glucose: 96 mg/dl (ref 70–140)
Potassium: 4.3 mEq/L (ref 3.5–5.1)
Sodium: 138 mEq/L (ref 136–145)
TOTAL PROTEIN: 6.9 g/dL (ref 6.4–8.3)

## 2016-11-30 LAB — CBC WITH DIFFERENTIAL/PLATELET
BASO%: 0.5 % (ref 0.0–2.0)
BASOS ABS: 0 10*3/uL (ref 0.0–0.1)
EOS ABS: 0.2 10*3/uL (ref 0.0–0.5)
EOS%: 5 % (ref 0.0–7.0)
HCT: 42 % (ref 34.8–46.6)
HGB: 13.9 g/dL (ref 11.6–15.9)
LYMPH%: 25.2 % (ref 14.0–49.7)
MCH: 30.8 pg (ref 25.1–34.0)
MCHC: 33.1 g/dL (ref 31.5–36.0)
MCV: 93.1 fL (ref 79.5–101.0)
MONO#: 0.7 10*3/uL (ref 0.1–0.9)
MONO%: 18.5 % — ABNORMAL HIGH (ref 0.0–14.0)
NEUT%: 50.8 % (ref 38.4–76.8)
NEUTROS ABS: 2 10*3/uL (ref 1.5–6.5)
PLATELETS: 241 10*3/uL (ref 145–400)
RBC: 4.51 10*6/uL (ref 3.70–5.45)
RDW: 13.7 % (ref 11.2–14.5)
WBC: 4 10*3/uL (ref 3.9–10.3)
lymph#: 1 10*3/uL (ref 0.9–3.3)

## 2016-11-30 NOTE — Progress Notes (Signed)
[place] [phone]   [phone]  OFFICE PROGRESS NOTE  [name], MD [place] [address]  DIAGNOSIS: Recurrent non-small cell lung cancer presented with bilateral lung nodules in October 2016 initially diagnosed as a stage IA adenocarcinoma with positive EGFR mutation with deletion in exon 51 in December 2012.  PRIOR THERAPY: Status post bronchoscopy with right VATS and right upper lobectomy with lymph node dissection under the care of Dr. Servando Snare on 05/31/2011.  CURRENT THERAPY: Iressa 250 mg by mouth daily started 04/17/2015, status post 19 months of treatment.  INTERVAL HISTORY: Natalie Carlson 83 y.o. female returns to the clinic today for follow-up visit accompanied by her friend. The patient is doing very well today with no specific complaints. She continues to tolerate her current treatment with Iressa fairly well with no significant adverse effects except for once a month diarrhea. She denied having any significant skin rash. She denied having any weight loss or night sweats. She has no nausea, vomiting, diarrhea or constipation. The patient denied having any chest pain, shortness of breath, cough or hemoptysis. She denied having any headache or visual changes. She had repeat CT scan of the chest performed recently and she is here for evaluation and discussion of her scan results.   MEDICAL HISTORY: Past Medical History:  Diagnosis Date  . Arthritis   . Atrial fibrillation ([place])   . Cataract of both eyes   . Chronic back pain   . Chronic diarrhea   . Colitis   . Hemorrhoids   . History of colon polyps   . History of migraines   . Hyperlipidemia   . Hypothyroidism   . Macular degeneration, dry   . Malignant neoplasm of right upper lobe of lung (HCC)    Stage 1A  EGFR positive  Deletion in exon 19.  ALK-negative  Right Upper Lobe Lung Mass  SUV 2.9 on PET Status post VATS and right upper lobe lobectomy 05/31/2011  .  Mucus in stool   . Nephrolithiasis    1994 - passed on its on    ALLERGIES:  is allergic to doxycycline; entocort ec [budesonide]; neurontin [gabapentin]; vicodin [hydrocodone-acetaminophen]; adhesive [tape]; amoxicillin; chocolate; codeine; levaquin [levofloxacin in d5w]; morphine and related; naprosyn [naproxen]; penicillins; prednisone; tramadol; and uncoded nonscreenable allergen.  MEDICATIONS:  Current Outpatient Prescriptions  Medication Sig Dispense Refill  . APRISO 0.375 g 24 hr capsule TAKE 2 CAPSULE BY MOUTH TWICE DAILY 120 capsule 6  . Cholecalciferol 2000 UNITS CAPS Take 2,000 Units by mouth daily.     . diclofenac sodium (VOLTAREN) 1 % GEL Apply 1 application topically 2 (two) times daily as needed (for neck pain).     Marland Kitchen diltiazem (CARDIZEM CD) 180 MG 24 hr capsule TAKE 1 CAPSULE BY MOUTH EVERY DAY (DOSE INCREASE) 30 capsule 6  . furosemide (LASIX) 20 MG tablet TAKE 1 TABLET BY MOUTH EVERY DAY AS NEEDED FOR FLUID (Patient taking differently: TAKE 1 TABLET BY MOUTH EVERY DAY AS NEEDED FOR FLUID (Takes Tueday, Thursday, and Saturday)) 30 tablet 5  . gefitinib (IRESSA) 250 MG tablet Take 1 tablet (250 mg total) by mouth daily. 30 tablet 1  . LUTEIN PO Take 1 capsule by mouth daily. Reported on 10/27/2015    . Multiple Vitamins-Minerals (AIRBORNE) CHEW Chew 1 tablet by mouth daily.    . Multiple Vitamins-Minerals (HAIR/SKIN/NAILS) TABS Take 1 tablet by mouth daily.    . Omega-3 Fatty Acids (FISH  OIL) 1200 MG CAPS Take 1,200 mg by mouth daily.    . Polyvinyl Alcohol-Povidone (REFRESH OP) Place 1 drop into both eyes as needed (for dry eyes).    . potassium chloride (K-DUR,KLOR-CON) 10 MEQ tablet Take 1 tablet (10 mEq total) by mouth daily. 30 tablet 6  . warfarin (COUMADIN) 5 MG tablet Take 1 tablet daily except 1 1/2 tablets on Tuesdays and Fridays 35 tablet 4  . HYDROmorphone (DILAUDID) 4 MG tablet Take 1 tablet (4 mg total) by mouth every 6 (six) hours as needed for severe pain.  (Patient not taking: Reported on 10/31/2016) 20 tablet 0  . ondansetron (ZOFRAN ODT) 4 MG disintegrating tablet Take 1 tablet (4 mg total) by mouth every 8 (eight) hours as needed. (Patient not taking: Reported on 10/31/2016) 10 tablet 1  . ondansetron (ZOFRAN) 4 MG tablet Take 1 tablet (4 mg total) by mouth every 8 (eight) hours as needed for nausea or vomiting. (Patient not taking: Reported on 05/18/2016) 20 tablet 0   No current facility-administered medications for this visit.     SURGICAL HISTORY:  Past Surgical History:  Procedure Laterality Date  . Bladder tack  1996  . CARDIOVERSION  11/02/09   x 2  . CATARACT EXTRACTION  2010/2011  . COLONOSCOPY  3 YRS AGO  . COLONOSCOPY  03/06/2011   Procedure: COLONOSCOPY;  Surgeon: Rogene Houston, MD;  [facility];  Service: Endoscopy;  Laterality: N/A;  7:30 am  . ESOPHAGOGASTRODUODENOSCOPY    . FLEXIBLE SIGMOIDOSCOPY N/A 10/04/2012   Procedure: FLEXIBLE SIGMOIDOSCOPY;  Surgeon: Rogene Houston, MD;  [facility]: [facility];  Service: Endoscopy;  Laterality: N/A;  11:00-moved to 1015 Ann to notify pt  . THYROID SURGERY  1961  . TONSILLECTOMY    . VIDEO BRONCHOSCOPY  05/31/2011   Procedure: VIDEO BRONCHOSCOPY;  Surgeon: Grace Isaac, MD;  Location: [facility] OR;  Service: Thoracic;  Laterality: N/A;    REVIEW OF SYSTEMS:  Constitutional: negative Eyes: negative Ears, nose, mouth, throat, and face: negative Respiratory: negative Cardiovascular: negative Gastrointestinal: negative Genitourinary:negative Integument/breast: negative Hematologic/lymphatic: negative Musculoskeletal:negative Neurological: negative Behavioral/Psych: negative Endocrine: negative Allergic/Immunologic: negative   PHYSICAL EXAMINATION: General appearance: alert, cooperative and no distress Head: Normocephalic, without obvious abnormality, atraumatic Neck: no adenopathy, no JVD, supple, symmetrical, trachea midline and thyroid not enlarged, symmetric, no  tenderness/mass/nodules Lymph nodes: Cervical, supraclavicular, and axillary nodes normal. Resp: clear to auscultation bilaterally Back: symmetric, no curvature. ROM normal. No CVA tenderness. Cardio: regular rate and rhythm, S1, S2 normal, no murmur, click, rub or gallop GI: soft, non-tender; bowel sounds normal; no masses,  no organomegaly Extremities: extremities normal, atraumatic, no cyanosis or edema Neurologic: Alert and oriented X 3, normal strength and tone. Normal symmetric reflexes. Normal coordination and gait  ECOG PERFORMANCE STATUS: 0 - Asymptomatic  Blood pressure 133/66, pulse 72, temperature 97.9 F (36.6 C), temperature source Oral, resp. rate 17, height _0  (1.6 m), weight 111 lb (50.3 kg), SpO2 97 %.  LABORATORY DATA: Lab Results  Component Value Date   WBC 4.0 11/30/2016   HGB 13.9 11/30/2016   HCT 42.0 11/30/2016   MCV 93.1 11/30/2016   PLT 241 11/30/2016      Chemistry      Component Value Date/Time   NA 138 11/30/2016 1045   K 4.3 11/30/2016 1045   CL 102 09/26/2016 0720   CO2 27 11/30/2016 1045   BUN 10.8 11/30/2016 1045   CREATININE 0.8 11/30/2016 1045  Component Value Date/Time   CALCIUM 9.1 11/30/2016 1045   ALKPHOS 97 11/30/2016 1045   AST 24 11/30/2016 1045   ALT 14 11/30/2016 1045   BILITOT 0.70 11/30/2016 1045       RADIOGRAPHIC STUDIES: Ct Chest W Contrast  Result Date: 11/29/2016 CLINICAL DATA:  Restaging right lung cancer post surgery and chemotherapy. Chronic shortness of breath. No acute symptoms today. EXAM: CT CHEST WITH CONTRAST TECHNIQUE: Multidetector CT imaging of the chest was performed during intravenous contrast administration. CONTRAST:  10m ISOVUE-300 IOPAMIDOL (ISOVUE-300) INJECTION 61% COMPARISON:  CT 05/12/2016 and 08/23/2016 FINDINGS: Cardiovascular: Stable aortic atherosclerosis and ectasia without focal aneurysm or dissection. There is mild coronary artery atherosclerosis. No acute vascular findings are  evident. Stable mild cardiomegaly. No pericardial effusion. Mediastinum/Nodes: Stable 9 mm subcarinal node on image 70. There are no enlarged mediastinal, hilar or axillary lymph nodes. Bilateral thyroid nodularity appears stable. The esophagus and trachea demonstrate no significant findings. Lungs/Pleura: There is no pleural effusion. Stable postsurgical changes status post right upper lobe resection. There is stable perifissural thickening along the fissure at the right lung apex. Scattered small pulmonary nodules bilaterally are stable, largest measuring 8 mm at the left apex (image 17). No new or enlarging pulmonary nodules are identified. There is stable mild subpleural reticulation in both lungs. Upper abdomen: The visualized upper abdomen appears stable without suspicious findings. Musculoskeletal/Chest wall: There is no chest wall mass or suspicious osseous finding. There are degenerative changes in the spine associated with a convex left thoracolumbar scoliosis. IMPRESSION: 1. Stable chest CT. No evidence of local recurrence or progressive metastatic disease. 2. Stable pulmonary nodularity bilaterally, likely benign based on stability. 3. Multinodular goiter. 4.  Aortic Atherosclerosis (ICD10-I70.0). Electronically Signed   By: WRichardean SaleM.D.   On: 11/29/2016 08:57    ASSESSMENT AND PLAN:  This is a very pleasant 83years old white female with recurrent non-small cell lung cancer, adenocarcinoma with positive EGFR mutation. The patient was started on treatment with Iressa 250 mg by mouth daily status post 19 months of treatment and has been tolerating this treatment well with no significant adverse effects except for occasional skin rash and diarrhea. She had repeat CT scan of the chest performed recently. I personally and independently reviewed the scans and discuss the results with the patient today. Her scan showed no evidence for local recurrence or progressive metastatic disease. I  recommended for the patient to continue her current treatment with Iressa with the same dose. I would see her back for follow-up visit in one month's for evaluation and repeat blood work. She was advised to call immediately if she has any concerning symptoms in the interval. The patient voices understanding of current disease status and treatment options and is in agreement with the current care plan. All questions were answered. The patient knows to call the clinic with any problems, questions or concerns. We can certainly see the patient much sooner if necessary. I spent 15 minutes counseling the patient face to face. The total time spent in the appointment was 25 minutes.  Disclaimer: This note was dictated with voice recognition software. Similar sounding words can inadvertently be transcribed and may not be corrected upon review.

## 2016-12-07 ENCOUNTER — Ambulatory Visit (INDEPENDENT_AMBULATORY_CARE_PROVIDER_SITE_OTHER): Payer: Medicare Other | Admitting: *Deleted

## 2016-12-07 DIAGNOSIS — Z5181 Encounter for therapeutic drug level monitoring: Secondary | ICD-10-CM | POA: Diagnosis not present

## 2016-12-07 DIAGNOSIS — I4891 Unspecified atrial fibrillation: Secondary | ICD-10-CM | POA: Diagnosis not present

## 2016-12-07 LAB — POCT INR: INR: 2.6

## 2016-12-19 ENCOUNTER — Other Ambulatory Visit: Payer: Self-pay | Admitting: Medical Oncology

## 2016-12-19 DIAGNOSIS — C3490 Malignant neoplasm of unspecified part of unspecified bronchus or lung: Secondary | ICD-10-CM

## 2016-12-19 MED ORDER — GEFITINIB 250 MG PO TABS: 250.0000 mg | ORAL_TABLET | Freq: Every day | ORAL | 2 refills | Status: DC

## 2017-01-02 ENCOUNTER — Telehealth: Payer: Self-pay | Admitting: Internal Medicine

## 2017-01-02 ENCOUNTER — Other Ambulatory Visit (HOSPITAL_BASED_OUTPATIENT_CLINIC_OR_DEPARTMENT_OTHER): Payer: Medicare Other

## 2017-01-02 ENCOUNTER — Encounter: Payer: Self-pay | Admitting: Internal Medicine

## 2017-01-02 ENCOUNTER — Ambulatory Visit (HOSPITAL_BASED_OUTPATIENT_CLINIC_OR_DEPARTMENT_OTHER): Payer: Medicare Other | Admitting: Internal Medicine

## 2017-01-02 VITALS — BP 124/69 | HR 63 | Temp 98.0°F | Resp 18 | Ht 63.0 in | Wt 113.5 lb

## 2017-01-02 DIAGNOSIS — Z5181 Encounter for therapeutic drug level monitoring: Secondary | ICD-10-CM

## 2017-01-02 DIAGNOSIS — C3411 Malignant neoplasm of upper lobe, right bronchus or lung: Secondary | ICD-10-CM

## 2017-01-02 LAB — CBC WITH DIFFERENTIAL/PLATELET
BASO%: 0.5 % (ref 0.0–2.0)
BASOS ABS: 0 10*3/uL (ref 0.0–0.1)
EOS ABS: 0.2 10*3/uL (ref 0.0–0.5)
EOS%: 4.3 % (ref 0.0–7.0)
HEMATOCRIT: 42.5 % (ref 34.8–46.6)
HGB: 13.9 g/dL (ref 11.6–15.9)
LYMPH#: 0.9 10*3/uL (ref 0.9–3.3)
LYMPH%: 23.2 % (ref 14.0–49.7)
MCH: 30.7 pg (ref 25.1–34.0)
MCHC: 32.7 g/dL (ref 31.5–36.0)
MCV: 93.8 fL (ref 79.5–101.0)
MONO#: 0.7 10*3/uL (ref 0.1–0.9)
MONO%: 17.9 % — ABNORMAL HIGH (ref 0.0–14.0)
NEUT#: 2.1 10*3/uL (ref 1.5–6.5)
NEUT%: 54.1 % (ref 38.4–76.8)
PLATELETS: 243 10*3/uL (ref 145–400)
RBC: 4.53 10*6/uL (ref 3.70–5.45)
RDW: 13.6 % (ref 11.2–14.5)
WBC: 4 10*3/uL (ref 3.9–10.3)

## 2017-01-02 LAB — COMPREHENSIVE METABOLIC PANEL
ALK PHOS: 93 U/L (ref 40–150)
ALT: 13 U/L (ref 0–55)
AST: 24 U/L (ref 5–34)
Albumin: 3.4 g/dL — ABNORMAL LOW (ref 3.5–5.0)
Anion Gap: 6 mEq/L (ref 3–11)
BUN: 16.2 mg/dL (ref 7.0–26.0)
CHLORIDE: 105 meq/L (ref 98–109)
CO2: 30 mEq/L — ABNORMAL HIGH (ref 22–29)
Calcium: 9.3 mg/dL (ref 8.4–10.4)
Creatinine: 0.8 mg/dL (ref 0.6–1.1)
EGFR: 68 mL/min/{1.73_m2} — AB (ref >90)
Glucose: 86 mg/dl (ref 70–140)
POTASSIUM: 4.2 meq/L (ref 3.5–5.1)
Sodium: 141 mEq/L (ref 136–145)
Total Bilirubin: 0.44 mg/dL (ref 0.20–1.20)
Total Protein: 6.6 g/dL (ref 6.4–8.3)

## 2017-01-02 NOTE — Telephone Encounter (Signed)
Gave patient avs and calendar for upcoming appts.

## 2017-01-02 NOTE — Progress Notes (Signed)
[place] [phone]   [phone]  OFFICE PROGRESS NOTE  Natalie Bis, MD [place] [address]  DIAGNOSIS: Recurrent non-small cell lung cancer presented with bilateral lung nodules in October 2016 initially diagnosed as a stage IA adenocarcinoma with positive EGFR mutation with deletion in exon 56 in December 2012.  PRIOR THERAPY: Status post bronchoscopy with right VATS and right upper lobectomy with lymph node dissection under the care of Dr. Servando Snare on 05/31/2011.  CURRENT THERAPY: Iressa 250 mg by mouth daily started 04/17/2015, status post 20 months of treatment.  INTERVAL HISTORY: Natalie Carlson 83 y.o. female returns to the clinic today for follow-up visit accompanied by her friend. The patient has no complaints today. She denied having any significant weight loss or night sweats. She has no nausea, vomiting, diarrhea or constipation. She continues to have very mild skin rash on the face. She has no chest pain, shortness of breath, cough or hemoptysis. She denied having any fever or chills. She is here today for evaluation and repeat blood work.   MEDICAL HISTORY: Past Medical History:  Diagnosis Date  . Arthritis   . Atrial fibrillation ([place])   . Cataract of both eyes   . Chronic back pain   . Chronic diarrhea   . Colitis   . Hemorrhoids   . History of colon polyps   . History of migraines   . Hyperlipidemia   . Hypothyroidism   . Macular degeneration, dry   . Malignant neoplasm of right upper lobe of lung (HCC)    Stage 1A  EGFR positive  Deletion in exon 19.  ALK-negative  Right Upper Lobe Lung Mass  SUV 2.9 on PET Status post VATS and right upper lobe lobectomy 05/31/2011  . Mucus in stool   . Nephrolithiasis    1994 - passed on its on    ALLERGIES:  is allergic to doxycycline; entocort ec [budesonide]; neurontin [gabapentin]; vicodin [hydrocodone-acetaminophen]; adhesive [tape]; amoxicillin; chocolate;  codeine; levaquin [levofloxacin in d5w]; morphine and related; naprosyn [naproxen]; penicillins; prednisone; tramadol; and uncoded nonscreenable allergen.  MEDICATIONS:  Current Outpatient Prescriptions  Medication Sig Dispense Refill  . APRISO 0.375 g 24 hr capsule TAKE 2 CAPSULE BY MOUTH TWICE DAILY 120 capsule 6  . Cholecalciferol 2000 UNITS CAPS Take 2,000 Units by mouth daily.     . diclofenac sodium (VOLTAREN) 1 % GEL Apply 1 application topically 2 (two) times daily as needed (for neck pain).     Marland Kitchen diltiazem (CARDIZEM CD) 180 MG 24 hr capsule TAKE 1 CAPSULE BY MOUTH EVERY DAY (DOSE INCREASE) 30 capsule 6  . furosemide (LASIX) 20 MG tablet TAKE 1 TABLET BY MOUTH EVERY DAY AS NEEDED FOR FLUID (Patient taking differently: TAKE 1 TABLET BY MOUTH EVERY DAY AS NEEDED FOR FLUID (Takes Tueday, Thursday, and Saturday)) 30 tablet 5  . gefitinib (IRESSA) 250 MG tablet Take 1 tablet (250 mg total) by mouth daily. 30 tablet 2  . HYDROmorphone (DILAUDID) 4 MG tablet Take 1 tablet (4 mg total) by mouth every 6 (six) hours as needed for severe pain. (Patient not taking: Reported on 10/31/2016) 20 tablet 0  . LUTEIN PO Take 1 capsule by mouth daily. Reported on 10/27/2015    . Multiple Vitamins-Minerals (AIRBORNE) CHEW Chew 1 tablet by mouth daily.    . Multiple Vitamins-Minerals (HAIR/SKIN/NAILS) TABS Take 1 tablet by mouth daily.    . Omega-3 Fatty Acids (FISH OIL) 1200 MG CAPS  Take 1,200 mg by mouth daily.    . ondansetron (ZOFRAN ODT) 4 MG disintegrating tablet Take 1 tablet (4 mg total) by mouth every 8 (eight) hours as needed. (Patient not taking: Reported on 10/31/2016) 10 tablet 1  . ondansetron (ZOFRAN) 4 MG tablet Take 1 tablet (4 mg total) by mouth every 8 (eight) hours as needed for nausea or vomiting. (Patient not taking: Reported on 05/18/2016) 20 tablet 0  . Polyvinyl Alcohol-Povidone (REFRESH OP) Place 1 drop into both eyes as needed (for dry eyes).    . potassium chloride (K-DUR,KLOR-CON) 10 MEQ  tablet Take 1 tablet (10 mEq total) by mouth daily. 30 tablet 6  . warfarin (COUMADIN) 5 MG tablet Take 1 tablet daily except 1 1/2 tablets on Tuesdays and Fridays 35 tablet 4   No current facility-administered medications for this visit.     SURGICAL HISTORY:  Past Surgical History:  Procedure Laterality Date  . Bladder tack  1996  . CARDIOVERSION  11/02/09   x 2  . CATARACT EXTRACTION  2010/2011  . COLONOSCOPY  3 YRS AGO  . COLONOSCOPY  03/06/2011   Procedure: COLONOSCOPY;  Surgeon: Rogene Houston, MD;  [facility];  Service: Endoscopy;  Laterality: N/A;  7:30 am  . ESOPHAGOGASTRODUODENOSCOPY    . FLEXIBLE SIGMOIDOSCOPY N/A 10/04/2012   Procedure: FLEXIBLE SIGMOIDOSCOPY;  Surgeon: Rogene Houston, MD;  [facility];  Service: Endoscopy;  Laterality: N/A;  11:00-moved to 1015 Ann to notify pt  . THYROID SURGERY  1961  . TONSILLECTOMY    . VIDEO BRONCHOSCOPY  05/31/2011   Procedure: VIDEO BRONCHOSCOPY;  Surgeon: Grace Isaac, MD;  Location: [facility] OR;  Service: Thoracic;  Laterality: N/A;    REVIEW OF SYSTEMS:  A comprehensive review of systems was negative.   PHYSICAL EXAMINATION: General appearance: alert, cooperative and no distress Head: Normocephalic, without obvious abnormality, atraumatic Neck: no adenopathy, no JVD, supple, symmetrical, trachea midline and thyroid not enlarged, symmetric, no tenderness/mass/nodules Lymph nodes: Cervical, supraclavicular, and axillary nodes normal. Resp: clear to auscultation bilaterally Back: symmetric, no curvature. ROM normal. No CVA tenderness. Cardio: regular rate and rhythm, S1, S2 normal, no murmur, click, rub or gallop GI: soft, non-tender; bowel sounds normal; no masses,  no organomegaly Extremities: extremities normal, atraumatic, no cyanosis or edema  ECOG PERFORMANCE STATUS: 0 - Asymptomatic  Blood pressure 124/69, pulse 63, temperature 98 F (36.7 C), temperature source Oral, resp. rate 18, height 5'  3" (1.6 m), weight 113 lb 8 oz (51.5 kg), SpO2 97 %.  LABORATORY DATA: Lab Results  Component Value Date   WBC 4.0 01/02/2017   HGB 13.9 01/02/2017   HCT 42.5 01/02/2017   MCV 93.8 01/02/2017   PLT 243 01/02/2017      Chemistry      Component Value Date/Time   NA 141 01/02/2017 1311   K 4.2 01/02/2017 1311   CL 102 09/26/2016 0720   CO2 30 (H) 01/02/2017 1311   BUN 16.2 01/02/2017 1311   CREATININE 0.8 01/02/2017 1311      Component Value Date/Time   CALCIUM 9.3 01/02/2017 1311   ALKPHOS 93 01/02/2017 1311   AST 24 01/02/2017 1311   ALT 13 01/02/2017 1311   BILITOT 0.44 01/02/2017 1311       RADIOGRAPHIC STUDIES: No results found.  ASSESSMENT AND PLAN:  This is a very pleasant 83 years old white female with recurrent non-small cell lung cancer, adenocarcinoma with positive EGFR mutation. The patient was  started on treatment with Iressa 250 mg by mouth daily status post 20 months of treatment. The patient continues to tolerate her treatment with Iressa fairly well. I recommended for her to continue her treatment with the same dose. I will see her back for follow-up visit in one month's for reevaluation with repeat blood work. She was advised to call immediately if she has any concerning symptoms in the interval. The patient voices understanding of current disease status and treatment options and is in agreement with the current care plan. All questions were answered. The patient knows to call the clinic with any problems, questions or concerns. We can certainly see the patient much sooner if necessary. I spent 10 minutes counseling the patient face to face. The total time spent in the appointment was 15 minutes.  Disclaimer: This note was dictated with voice recognition software. Similar sounding words can inadvertently be transcribed and may not be corrected upon review.

## 2017-01-18 ENCOUNTER — Telehealth: Payer: Self-pay | Admitting: Cardiology

## 2017-01-18 ENCOUNTER — Ambulatory Visit (INDEPENDENT_AMBULATORY_CARE_PROVIDER_SITE_OTHER): Payer: Medicare Other | Admitting: *Deleted

## 2017-01-18 ENCOUNTER — Other Ambulatory Visit: Payer: Self-pay | Admitting: Cardiology

## 2017-01-18 DIAGNOSIS — I4891 Unspecified atrial fibrillation: Secondary | ICD-10-CM

## 2017-01-18 DIAGNOSIS — I6523 Occlusion and stenosis of bilateral carotid arteries: Secondary | ICD-10-CM

## 2017-01-18 DIAGNOSIS — Z5181 Encounter for therapeutic drug level monitoring: Secondary | ICD-10-CM | POA: Diagnosis not present

## 2017-01-18 LAB — POCT INR: INR: 3

## 2017-01-18 NOTE — Telephone Encounter (Signed)
2 yr fu Cartoid Dopplers   per VAS REPORT  - last Cartoid Study was 07/09/2014   Scheduled Sept 19, 2018 in [place]

## 2017-01-31 ENCOUNTER — Ambulatory Visit: Payer: Medicare Other

## 2017-01-31 DIAGNOSIS — I6523 Occlusion and stenosis of bilateral carotid arteries: Secondary | ICD-10-CM

## 2017-01-31 LAB — VAS US CAROTID
LCCAPSYS: 106 cm/s
LEFT ECA DIAS: -11 cm/s
LEFT VERTEBRAL DIAS: -14 cm/s
LICADSYS: -74 cm/s
LICAPDIAS: -19 cm/s
LICAPSYS: -59 cm/s
Left CCA dist dias: -24 cm/s
Left CCA dist sys: -89 cm/s
Left CCA prox dias: 15 cm/s
Left ICA dist dias: -28 cm/s
RIGHT ECA DIAS: -11 cm/s
RIGHT VERTEBRAL DIAS: -19 cm/s
Right CCA prox dias: 25 cm/s
Right CCA prox sys: 109 cm/s
Right cca dist sys: -78 cm/s

## 2017-02-01 ENCOUNTER — Telehealth: Payer: Self-pay

## 2017-02-01 NOTE — Telephone Encounter (Signed)
Patient notified. Routed to PCP 

## 2017-02-01 NOTE — Telephone Encounter (Signed)
-----   Message from Acquanetta Chain, LPN sent at 7/94/4461 11:02 AM EDT -----   ----- Message ----- From: Satira Sark, MD Sent: 02/01/2017   8:45 AM To: Merlene Laughter, LPN  Results reviewed. Stable, mild bilateral ICA disease at 1-39%. A copy of this test should be forwarded to [name], MD.

## 2017-02-06 ENCOUNTER — Other Ambulatory Visit (HOSPITAL_BASED_OUTPATIENT_CLINIC_OR_DEPARTMENT_OTHER): Payer: Medicare Other

## 2017-02-06 ENCOUNTER — Telehealth: Payer: Self-pay | Admitting: Internal Medicine

## 2017-02-06 ENCOUNTER — Encounter: Payer: Self-pay | Admitting: Internal Medicine

## 2017-02-06 ENCOUNTER — Ambulatory Visit (HOSPITAL_BASED_OUTPATIENT_CLINIC_OR_DEPARTMENT_OTHER): Payer: Medicare Other | Admitting: Internal Medicine

## 2017-02-06 VITALS — BP 129/82 | HR 72 | Temp 97.8°F | Resp 18 | Ht 63.0 in | Wt 111.8 lb

## 2017-02-06 DIAGNOSIS — C3411 Malignant neoplasm of upper lobe, right bronchus or lung: Secondary | ICD-10-CM

## 2017-02-06 DIAGNOSIS — Z5181 Encounter for therapeutic drug level monitoring: Secondary | ICD-10-CM

## 2017-02-06 DIAGNOSIS — C3490 Malignant neoplasm of unspecified part of unspecified bronchus or lung: Secondary | ICD-10-CM

## 2017-02-06 LAB — COMPREHENSIVE METABOLIC PANEL
ALT: 13 U/L (ref 0–55)
AST: 27 U/L (ref 5–34)
Albumin: 3.6 g/dL (ref 3.5–5.0)
Alkaline Phosphatase: 91 U/L (ref 40–150)
Anion Gap: 8 mEq/L (ref 3–11)
BUN: 13.4 mg/dL (ref 7.0–26.0)
CHLORIDE: 106 meq/L (ref 98–109)
CO2: 28 meq/L (ref 22–29)
CREATININE: 0.7 mg/dL (ref 0.6–1.1)
Calcium: 9.2 mg/dL (ref 8.4–10.4)
EGFR: 74 mL/min/{1.73_m2} — ABNORMAL LOW (ref >90)
GLUCOSE: 97 mg/dL (ref 70–140)
Potassium: 4 mEq/L (ref 3.5–5.1)
Sodium: 142 mEq/L (ref 136–145)
Total Bilirubin: 0.58 mg/dL (ref 0.20–1.20)
Total Protein: 6.9 g/dL (ref 6.4–8.3)

## 2017-02-06 LAB — CBC WITH DIFFERENTIAL/PLATELET
BASO%: 0.9 % (ref 0.0–2.0)
Basophils Absolute: 0 10*3/uL (ref 0.0–0.1)
EOS%: 4.3 % (ref 0.0–7.0)
Eosinophils Absolute: 0.2 10*3/uL (ref 0.0–0.5)
HCT: 44.2 % (ref 34.8–46.6)
HGB: 14.5 g/dL (ref 11.6–15.9)
LYMPH#: 1 10*3/uL (ref 0.9–3.3)
LYMPH%: 26.8 % (ref 14.0–49.7)
MCH: 30.4 pg (ref 25.1–34.0)
MCHC: 32.8 g/dL (ref 31.5–36.0)
MCV: 92.8 fL (ref 79.5–101.0)
MONO#: 0.5 10*3/uL (ref 0.1–0.9)
MONO%: 13.1 % (ref 0.0–14.0)
NEUT%: 54.9 % (ref 38.4–76.8)
NEUTROS ABS: 2.1 10*3/uL (ref 1.5–6.5)
Platelets: 230 10*3/uL (ref 145–400)
RBC: 4.76 10*6/uL (ref 3.70–5.45)
RDW: 14 % (ref 11.2–14.5)
WBC: 3.9 10*3/uL (ref 3.9–10.3)

## 2017-02-06 NOTE — Telephone Encounter (Signed)
Gave avs and calendar for October

## 2017-02-06 NOTE — Progress Notes (Signed)
[place] Telephone:([phone]) [phone]   [phone]) [phone]  OFFICE PROGRESS NOTE  [name], MD [place] [address]  DIAGNOSIS: Recurrent non-small cell lung cancer presented with bilateral lung nodules in October 2016 initially diagnosed as a stage IA adenocarcinoma with positive EGFR mutation with deletion in exon 3 in December 2012.  PRIOR THERAPY: Status post bronchoscopy with right VATS and right upper lobectomy with lymph node dissection under the care of Dr. Servando Snare on 05/31/2011.  CURRENT THERAPY: Iressa 250 mg by mouth daily started 04/17/2015, status post 21 months of treatment.  INTERVAL HISTORY: Natalie Carlson 83 y.o. female returns to the clinic today for follow-up visit accompanied by her friend. The patient is feeling fine today with no specific complaints. She continues to tolerate her treatment with Iressa fairly well. She denied having any skin rash or diarrhea. She has no chest pain, shortness of breath, cough or hemoptysis. She denied having any fever or chills. She has no weight loss or night sweats. She is here today for evaluation and repeat blood work.  MEDICAL HISTORY: Past Medical History:  Diagnosis Date  . Arthritis   . Atrial fibrillation ([place])   . Cataract of both eyes   . Chronic back pain   . Chronic diarrhea   . Colitis   . Hemorrhoids   . History of colon polyps   . History of migraines   . Hyperlipidemia   . Hypothyroidism   . Macular degeneration, dry   . Malignant neoplasm of right upper lobe of lung (HCC)    Stage 1A  EGFR positive  Deletion in exon 19.  ALK-negative  Right Upper Lobe Lung Mass  SUV 2.9 on PET Status post VATS and right upper lobe lobectomy 05/31/2011  . Mucus in stool   . Nephrolithiasis    1994 - passed on its on    ALLERGIES:  is allergic to doxycycline; entocort ec [budesonide]; neurontin [gabapentin]; vicodin [hydrocodone-acetaminophen]; adhesive [tape]; amoxicillin; chocolate;  codeine; levaquin [levofloxacin in d5w]; morphine and related; naprosyn [naproxen]; penicillins; prednisone; tramadol; and uncoded nonscreenable allergen.  MEDICATIONS:  Current Outpatient Prescriptions  Medication Sig Dispense Refill  . APRISO 0.375 g 24 hr capsule TAKE 2 CAPSULE BY MOUTH TWICE DAILY 120 capsule 6  . Cholecalciferol 2000 UNITS CAPS Take 2,000 Units by mouth daily.     . diclofenac sodium (VOLTAREN) 1 % GEL Apply 1 application topically 2 (two) times daily as needed (for neck pain).     Marland Kitchen diltiazem (CARDIZEM CD) 180 MG 24 hr capsule TAKE 1 CAPSULE BY MOUTH EVERY DAY (DOSE INCREASE) 30 capsule 6  . furosemide (LASIX) 20 MG tablet TAKE 1 TABLET BY MOUTH EVERY DAY AS NEEDED FOR FLUID (Patient taking differently: TAKE 1 TABLET BY MOUTH EVERY DAY AS NEEDED FOR FLUID (Takes Tueday, Thursday, and Saturday)) 30 tablet 5  . gefitinib (IRESSA) 250 MG tablet Take 1 tablet (250 mg total) by mouth daily. 30 tablet 2  . HYDROmorphone (DILAUDID) 4 MG tablet Take 1 tablet (4 mg total) by mouth every 6 (six) hours as needed for severe pain. (Patient not taking: Reported on 10/31/2016) 20 tablet 0  . LUTEIN PO Take 1 capsule by mouth daily. Reported on 10/27/2015    . magnesium 30 MG tablet Take 30 mg by mouth every morning.    . Multiple Vitamins-Minerals (AIRBORNE) CHEW Chew 1 tablet by mouth daily.    . Multiple Vitamins-Minerals (HAIR/SKIN/NAILS) TABS Take 1 tablet  by mouth daily.    . Omega-3 Fatty Acids (FISH OIL) 1200 MG CAPS Take 1,200 mg by mouth daily.    . ondansetron (ZOFRAN ODT) 4 MG disintegrating tablet Take 1 tablet (4 mg total) by mouth every 8 (eight) hours as needed. (Patient not taking: Reported on 10/31/2016) 10 tablet 1  . Polyvinyl Alcohol-Povidone (REFRESH OP) Place 1 drop into both eyes as needed (for dry eyes).    . potassium chloride (K-DUR,KLOR-CON) 10 MEQ tablet Take 1 tablet (10 mEq total) by mouth daily. 30 tablet 6  . warfarin (COUMADIN) 5 MG tablet Take 1 tablet daily  except 1 1/2 tablets on Tuesdays and Fridays 35 tablet 4   No current facility-administered medications for this visit.     SURGICAL HISTORY:  Past Surgical History:  Procedure Laterality Date  . Bladder tack  1996  . CARDIOVERSION  11/02/09   x 2  . CATARACT EXTRACTION  2010/2011  . COLONOSCOPY  3 YRS AGO  . COLONOSCOPY  03/06/2011   Procedure: COLONOSCOPY;  Surgeon: Rogene Houston, MD;  [facility];  Service: Endoscopy;  Laterality: N/A;  7:30 am  . ESOPHAGOGASTRODUODENOSCOPY    . FLEXIBLE SIGMOIDOSCOPY N/A 10/04/2012   Procedure: FLEXIBLE SIGMOIDOSCOPY;  Surgeon: Rogene Houston, MD;  Location: [facility];  Service: Endoscopy;  Laterality: N/A;  11:00-moved to 1015 Ann to notify pt  . THYROID SURGERY  1961  . TONSILLECTOMY    . VIDEO BRONCHOSCOPY  05/31/2011   Procedure: VIDEO BRONCHOSCOPY;  Surgeon: Grace Isaac, MD;  [facility];  Service: Thoracic;  Laterality: N/A;    REVIEW OF SYSTEMS:  A comprehensive review of systems was negative.   PHYSICAL EXAMINATION: General appearance: alert, cooperative and no distress Head: Normocephalic, without obvious abnormality, atraumatic Neck: no adenopathy, no JVD, supple, symmetrical, trachea midline and thyroid not enlarged, symmetric, no tenderness/mass/nodules Lymph nodes: Cervical, supraclavicular, and axillary nodes normal. Resp: clear to auscultation bilaterally Back: symmetric, no curvature. ROM normal. No CVA tenderness. Cardio: regular rate and rhythm, S1, S2 normal, no murmur, click, rub or gallop GI: soft, non-tender; bowel sounds normal; no masses,  no organomegaly Extremities: extremities normal, atraumatic, no cyanosis or edema  ECOG PERFORMANCE STATUS: 0 - Asymptomatic  Blood pressure 129/82, pulse 72, temperature 97.8 F (36.6 C), temperature source Oral, resp. rate 18, height _0  (1.6 m), weight 111 lb 12.8 oz (50.7 kg), SpO2 99 %.  LABORATORY DATA: Lab Results  Component Value Date   WBC  3.9 02/06/2017   HGB 14.5 02/06/2017   HCT 44.2 02/06/2017   MCV 92.8 02/06/2017   PLT 230 02/06/2017      Chemistry      Component Value Date/Time   NA 141 01/02/2017 1311   K 4.2 01/02/2017 1311   CL 102 09/26/2016 0720   CO2 30 (H) 01/02/2017 1311   BUN 16.2 01/02/2017 1311   CREATININE 0.8 01/02/2017 1311      Component Value Date/Time   CALCIUM 9.3 01/02/2017 1311   ALKPHOS 93 01/02/2017 1311   AST 24 01/02/2017 1311   ALT 13 01/02/2017 1311   BILITOT 0.44 01/02/2017 1311       RADIOGRAPHIC STUDIES: No results found.  ASSESSMENT AND PLAN:  This is a very pleasant 83 years old white female with recurrent non-small cell lung cancer, adenocarcinoma with positive EGFR mutation. The patient was started on treatment with Iressa 250 mg by mouth daily status post 21 months of treatment. She continues to  tolerate her treatment well. I recommended for her to continue her treatment with Iressa for now. I would see her back for follow-up visit in one month for evaluation after repeating CT scan of the chest for restaging of her disease. She was advised to call immediately if she has any concerning symptoms in the interval. The patient voices understanding of current disease status and treatment options and is in agreement with the current care plan. All questions were answered. The patient knows to call the clinic with any problems, questions or concerns. We can certainly see the patient much sooner if necessary. I spent 10 minutes counseling the patient face to face. The total time spent in the appointment was 15 minutes.  Disclaimer: This note was dictated with voice recognition software. Similar sounding words can inadvertently be transcribed and may not be corrected upon review.

## 2017-02-19 ENCOUNTER — Ambulatory Visit (INDEPENDENT_AMBULATORY_CARE_PROVIDER_SITE_OTHER): Payer: Medicare Other | Admitting: Cardiology

## 2017-02-19 ENCOUNTER — Encounter: Payer: Self-pay | Admitting: Cardiology

## 2017-02-19 VITALS — BP 92/60 | HR 75 | Ht 63.0 in | Wt 110.4 lb

## 2017-02-19 DIAGNOSIS — Z23 Encounter for immunization: Secondary | ICD-10-CM | POA: Diagnosis not present

## 2017-02-19 DIAGNOSIS — I482 Chronic atrial fibrillation, unspecified: Secondary | ICD-10-CM

## 2017-02-19 DIAGNOSIS — I6523 Occlusion and stenosis of bilateral carotid arteries: Secondary | ICD-10-CM | POA: Diagnosis not present

## 2017-02-19 DIAGNOSIS — Z85118 Personal history of other malignant neoplasm of bronchus and lung: Secondary | ICD-10-CM | POA: Diagnosis not present

## 2017-02-19 DIAGNOSIS — E782 Mixed hyperlipidemia: Secondary | ICD-10-CM

## 2017-02-19 NOTE — Progress Notes (Signed)
Cardiology Office Note  Date: 02/19/2017   ID: Natalie Carlson, DOB [birth year], MRN [id number]  PCP: [name], MD  Primary Cardiologist: Rozann Lesches, MD   Chief Complaint  Patient presents with  . Atrial Fibrillation    History of Present Illness: Natalie Carlson is an 83 y.o. female last seen in February. She presents for a routine follow-up visit. Since last encounter she does not report any palpitations or chest pain. She describes NYHA class II dyspnea with typical activities including yard work.  She remains on Coumadin with follow-up in the anticoagulation clinic. She does not report any bleeding problems. Last INR was 3.0.  Follow-up carotid Dopplers in September showed 1-39% bilateral ICA stenoses. She is asymptomatic.  I reviewed her medications which are outlined below. Cardiac regimen includes Cardizem CD, Lasix, and potassium supplements.  Past Medical History:  Diagnosis Date  . Arthritis   . Atrial fibrillation ([place])   . Cataract of both eyes   . Chronic back pain   . Chronic diarrhea   . Colitis   . Hemorrhoids   . History of colon polyps   . History of migraines   . Hyperlipidemia   . Hypothyroidism   . Macular degeneration, dry   . Malignant neoplasm of right upper lobe of lung (HCC)    Stage 1A  EGFR positive  Deletion in exon 19.  ALK-negative  Right Upper Lobe Lung Mass  SUV 2.9 on PET Status post VATS and right upper lobe lobectomy 05/31/2011  . Mucus in stool   . Nephrolithiasis    1994 - passed on its on    Past Surgical History:  Procedure Laterality Date  . Bladder tack  1996  . CARDIOVERSION  11/02/09   x 2  . CATARACT EXTRACTION  2010/2011  . COLONOSCOPY  3 YRS AGO  . COLONOSCOPY  03/06/2011   Procedure: COLONOSCOPY;  Surgeon: Rogene Houston, MD;  [facility];  Service: Endoscopy;  Laterality: N/A;  7:30 am  . ESOPHAGOGASTRODUODENOSCOPY    . FLEXIBLE SIGMOIDOSCOPY N/A 10/04/2012   Procedure: FLEXIBLE SIGMOIDOSCOPY;   Surgeon: Rogene Houston, MD;  [facility];  Service: Endoscopy;  Laterality: N/A;  11:00-moved to 1015 Ann to notify pt  . THYROID SURGERY  1961  . TONSILLECTOMY    . VIDEO BRONCHOSCOPY  05/31/2011   Procedure: VIDEO BRONCHOSCOPY;  Surgeon: Grace Isaac, MD;  Location: [facility] OR;  Service: Thoracic;  Laterality: N/A;    Current Outpatient Prescriptions  Medication Sig Dispense Refill  . APRISO 0.375 g 24 hr capsule TAKE 2 CAPSULE BY MOUTH TWICE DAILY 120 capsule 6  . Cholecalciferol 2000 UNITS CAPS Take 2,000 Units by mouth daily.     . diclofenac sodium (VOLTAREN) 1 % GEL Apply 1 application topically 2 (two) times daily as needed (for neck pain).     Marland Kitchen diltiazem (CARDIZEM CD) 180 MG 24 hr capsule TAKE 1 CAPSULE BY MOUTH EVERY DAY (DOSE INCREASE) 30 capsule 6  . furosemide (LASIX) 20 MG tablet TAKE 1 TABLET BY MOUTH EVERY DAY AS NEEDED FOR FLUID (Patient taking differently: TAKE 1 TABLET BY MOUTH EVERY DAY AS NEEDED FOR FLUID (Takes Tueday, Thursday, and Saturday)) 30 tablet 5  . gefitinib (IRESSA) 250 MG tablet Take 1 tablet (250 mg total) by mouth daily. 30 tablet 2  . HYDROmorphone (DILAUDID) 4 MG tablet Take 1 tablet (4 mg total) by mouth every 6 (six) hours as needed for severe pain.  20 tablet 0  . LUTEIN PO Take 1 capsule by mouth daily. Reported on 10/27/2015    . magnesium 30 MG tablet Take 30 mg by mouth every morning.    . Multiple Vitamins-Minerals (AIRBORNE) CHEW Chew 1 tablet by mouth daily.    . Multiple Vitamins-Minerals (HAIR/SKIN/NAILS) TABS Take 1 tablet by mouth daily.    . Omega-3 Fatty Acids (FISH OIL) 1200 MG CAPS Take 1,200 mg by mouth daily.    . ondansetron (ZOFRAN ODT) 4 MG disintegrating tablet Take 1 tablet (4 mg total) by mouth every 8 (eight) hours as needed. 10 tablet 1  . Polyvinyl Alcohol-Povidone (REFRESH OP) Place 1 drop into both eyes as needed (for dry eyes).    . potassium chloride (K-DUR,KLOR-CON) 10 MEQ tablet Take 1 tablet (10 mEq total) by  mouth daily. 30 tablet 6  . warfarin (COUMADIN) 5 MG tablet Take 1 tablet daily except 1 1/2 tablets on Tuesdays and Fridays 35 tablet 4   No current facility-administered medications for this visit.    Allergies:  Doxycycline; Entocort ec [budesonide]; Neurontin [gabapentin]; Vicodin [hydrocodone-acetaminophen]; Adhesive [tape]; Amoxicillin; Chocolate; Codeine; Levaquin [levofloxacin in d5w]; Morphine and related; Naprosyn [naproxen]; Penicillins; Prednisone; Tramadol; and Uncoded nonscreenable allergen   Social History: The patient  reports that she has never smoked. She has never used smokeless tobacco. She reports that she does not drink alcohol or use drugs.   ROS:  Please see the history of present illness. Otherwise, complete review of systems is positive for none.  All other systems are reviewed and negative.   Physical Exam: VS:  BP 92/60   Pulse 75   Ht _0  (1.6 m)   Wt 110 lb 6.4 oz (50.1 kg)   SpO2 96%   BMI 19.56 kg/m , BMI Body mass index is 19.56 kg/m.  Wt Readings from Last 3 Encounters:  02/19/17 110 lb 6.4 oz (50.1 kg)  02/06/17 111 lb 12.8 oz (50.7 kg)  01/02/17 113 lb 8 oz (51.5 kg)    General: Elderly woman, appears comfortable at rest. HEENT: Conjunctiva and lids normal, oropharynx clear. Neck: Supple, no elevated JVP or carotid bruits, no thyromegaly. Lungs: Irregularly irregular, nonlabored breathing at rest. Cardiac: Regular rate and rhythm, no S3 or significant systolic murmur, no pericardial rub. Abdomen: Soft, nontender, bowel sounds present, no guarding or rebound. Extremities: Trace ankle edema, distal pulses 2+. Skin: Warm and dry. Musculoskeletal: No kyphosis. Neuropsychiatric: Alert and oriented x3, affect grossly appropriate.  ECG: I personally reviewed the tracing from 08/10/2015 which showed rate-controlled atrial fibrillation.  Recent Labwork: 02/06/2017: ALT 13; AST 27; BUN 13.4; Creatinine 0.7; HGB 14.5; Platelets 230; Potassium 4.0;  Sodium 142   Other Studies Reviewed Today:  Carotid Dopplers 01/31/2017: Stable, mild bilateral ICA disease at 1-39%.  Echocardiogram 02/04/2014: Study Conclusions  - Left ventricle: The cavity size was normal. Wall thickness was normal. Systolic function was normal. The estimated ejection fraction was in the range of 60% to 65%. Wall motion was normal; there were no regional wall motion abnormalities. The study is not technically sufficient to allow evaluation of LV diastolic function. - Aortic valve: Mildly calcified annulus. Trileaflet. There was trivial regurgitation. - Mitral valve: Calcified annulus. There was mild regurgitation. - Right ventricle: The cavity size was mildly to moderately dilated. Systolic function was normal. - Right atrium: The atrium was mildly dilated. Central venous pressure (est): 3 mm Hg. - Atrial septum: No defect or patent foramen ovale was identified. - Tricuspid valve: There was  mild-moderate regurgitation. - Pulmonary arteries: PA peak pressure: 34 mm Hg (S). - Pericardium, extracardiac: There was no pericardial effusion.  Impressions:  - Normal LV wall thickness and chamber size with LVEF 60-65%. Indeterminate diastolic function in the setting of coarse atrial fibrillation/flutter. MAC with mild mitral regurgitation. Mild to moderate RV enlargement with normal contraction. Mild right atrial enlargement. Mild to moderate tricuspid regurgitation with PASP 34 mmHg.  Assessment and Plan:  1. Chronic atrial fibrillation, no active palpitations on current regimen including Cardizem CD and Coumadin for stroke prophylaxis. Keep follow-up in the anticoagulation clinic.  2. Hyperlipidemia, on omega-3 supplements. She is now following with Dr. Quillian Quince.  3. Mild, asymptomatic carotid artery disease. Reports history of statin intolerance.  4. Non-small cell lung cancer with history of recurrence. She has been stable and  continues on Iressa with follow-up per oncology.  Current medicines were reviewed with the patient today.  Disposition: Follow-up in 6 months.  Signed, Satira Sark, MD, [facility] 02/19/2017 4:32 PM    [place] [facility] [place], [place],  [address] Phone: [phone]; Fax: [phone]

## 2017-02-19 NOTE — Patient Instructions (Signed)

## 2017-02-22 ENCOUNTER — Other Ambulatory Visit: Payer: Self-pay | Admitting: Medical Oncology

## 2017-02-22 DIAGNOSIS — C3490 Malignant neoplasm of unspecified part of unspecified bronchus or lung: Secondary | ICD-10-CM

## 2017-02-22 MED ORDER — GEFITINIB 250 MG PO TABS: 250.0000 mg | ORAL_TABLET | Freq: Every day | ORAL | 2 refills | Status: DC

## 2017-02-22 MED ORDER — GEFITINIB 250 MG PO TABS: 250.0000 mg | ORAL_TABLET | Freq: Every day | ORAL | 5 refills | Status: DC

## 2017-02-27 ENCOUNTER — Encounter (INDEPENDENT_AMBULATORY_CARE_PROVIDER_SITE_OTHER): Payer: Self-pay | Admitting: Internal Medicine

## 2017-02-27 ENCOUNTER — Ambulatory Visit (INDEPENDENT_AMBULATORY_CARE_PROVIDER_SITE_OTHER): Payer: Medicare Other | Admitting: Internal Medicine

## 2017-02-27 VITALS — BP 98/60 | HR 58 | Temp 97.8°F | Resp 18 | Ht 63.0 in | Wt 110.2 lb

## 2017-02-27 DIAGNOSIS — I6523 Occlusion and stenosis of bilateral carotid arteries: Secondary | ICD-10-CM

## 2017-02-27 DIAGNOSIS — K5010 Crohn's disease of large intestine without complications: Secondary | ICD-10-CM

## 2017-02-27 NOTE — Progress Notes (Signed)
Presenting complaint;  Follow-up for Crohn's colitis.  Subjective:  Patient is 83 year old Caucasian female who has Crohn's colitis and is on oral mesalamine and is here for scheduled visit. She was last seen in June 2017. She says she is doing well. On most days she has 1 formed stool daily. Once in a while she has diarrhea which she believes is due to Iressa. She denies melena or rectal bleeding. She continues to have scant rectal discharge almost daily. She denies abdominal pain nausea vomiting. She says she has good appetite. She says she was given Dilaudid and ondansetron when she was diagnosed with kidney stone. She passed the stone even before each she could use these medications. She is under care of Dr. Earlie Server for recurrent non-small cell lung carcinoma and remains in remission on Iressa.   Current Medications: Outpatient Encounter Prescriptions as of 02/27/2017  Medication Sig  . APRISO 0.375 g 24 hr capsule TAKE 2 CAPSULE BY MOUTH TWICE DAILY  . Cholecalciferol 2000 UNITS CAPS Take 2,000 Units by mouth daily.   . diclofenac sodium (VOLTAREN) 1 % GEL Apply 1 application topically 2 (two) times daily as needed (for neck pain).   Marland Kitchen diltiazem (CARDIZEM CD) 180 MG 24 hr capsule TAKE 1 CAPSULE BY MOUTH EVERY DAY (DOSE INCREASE)  . FLUZONE HIGH-DOSE 0.5 ML injection Inject 0.5 mLs into the muscle See admin instructions.   . furosemide (LASIX) 20 MG tablet TAKE 1 TABLET BY MOUTH EVERY DAY AS NEEDED FOR FLUID (Patient taking differently: TAKE 1 TABLET BY MOUTH EVERY DAY AS NEEDED FOR FLUID ([place], Thursday, and Saturday))  . gefitinib (IRESSA) 250 MG tablet Take 1 tablet (250 mg total) by mouth daily.  Marland Kitchen HYDROmorphone (DILAUDID) 4 MG tablet Take 1 tablet (4 mg total) by mouth every 6 (six) hours as needed for severe pain.  Marland Kitchen LUTEIN PO Take 1 capsule by mouth daily. Reported on 10/27/2015  . magnesium 30 MG tablet Take 30 mg by mouth every morning.  . Multiple Vitamins-Minerals  (AIRBORNE) CHEW Chew 1 tablet by mouth daily.  . Multiple Vitamins-Minerals (HAIR/SKIN/NAILS) TABS Take 1 tablet by mouth daily.  . Omega-3 Fatty Acids (FISH OIL) 1200 MG CAPS Take 1,200 mg by mouth daily.  . Polyvinyl Alcohol-Povidone (REFRESH OP) Place 1 drop into both eyes as needed (for dry eyes).  . potassium chloride (K-DUR,KLOR-CON) 10 MEQ tablet Take 1 tablet (10 mEq total) by mouth daily.  Marland Kitchen warfarin (COUMADIN) 5 MG tablet Take 1 tablet daily except 1 1/2 tablets on Tuesdays and Fridays  . ondansetron (ZOFRAN ODT) 4 MG disintegrating tablet Take 1 tablet (4 mg total) by mouth every 8 (eight) hours as needed. (Patient not taking: Reported on 02/27/2017)   No facility-administered encounter medications on file as of 02/27/2017.      Objective: Blood pressure 98/60, pulse (!) 58, temperature 97.8 F (36.6 C), temperature source Oral, resp. rate 18, height 5' 3"  (1.6 m), weight 110 lb 3.2 oz (50 kg). Patient is alert and in no acute distress. Conjunctiva is pink. Sclera is nonicteric Oropharyngeal mucosa is normal. No neck masses or thyromegaly noted. Cardiac exam with iregular rhythm normal S1 and S2. No murmur or gallop noted. Lungs are clear to auscultation. Abdomen is symmetrical soft and nontender without organomegaly or masses. No LE edema or clubbing noted.  Labs/studies Results: Lab data from 02/06/2017   WBC 3.9, H&H 14.5 and 44.2 and platelet count 230K. Bilirubin 0.58. AP 91, AST 27, ALT 13 and albumin 3.6 by  Serum calcium 9.2.   Assessment:  #1. Crohn's colitis. She remains in remission. She remains with clear rectal discharge. She has not responded to topical steroid in the past. Low-dose Imodium may help.   Plan:  Continue Apriso at current dose. Imodium OTC 1 mg daily for 1-2 weeks to see if it would slow down rectal discharge. Office visit in one year unless symptoms relapse.

## 2017-02-27 NOTE — Patient Instructions (Signed)
Try Imodium OTC 1 mg daily or every other da to see if it stops rectal discharge. Try for 2 weeks or so.

## 2017-02-28 DIAGNOSIS — H353131 Nonexudative age-related macular degeneration, bilateral, early dry stage: Secondary | ICD-10-CM | POA: Diagnosis not present

## 2017-02-28 DIAGNOSIS — H16223 Keratoconjunctivitis sicca, not specified as Sjogren's, bilateral: Secondary | ICD-10-CM | POA: Diagnosis not present

## 2017-02-28 DIAGNOSIS — H524 Presbyopia: Secondary | ICD-10-CM | POA: Diagnosis not present

## 2017-02-28 DIAGNOSIS — Z961 Presence of intraocular lens: Secondary | ICD-10-CM | POA: Diagnosis not present

## 2017-03-01 ENCOUNTER — Ambulatory Visit (INDEPENDENT_AMBULATORY_CARE_PROVIDER_SITE_OTHER): Payer: Medicare Other | Admitting: *Deleted

## 2017-03-01 DIAGNOSIS — Z5181 Encounter for therapeutic drug level monitoring: Secondary | ICD-10-CM

## 2017-03-01 DIAGNOSIS — I4891 Unspecified atrial fibrillation: Secondary | ICD-10-CM

## 2017-03-01 LAB — POCT INR: INR: 2.4

## 2017-03-05 ENCOUNTER — Telehealth: Payer: Self-pay | Admitting: Internal Medicine

## 2017-03-05 NOTE — Telephone Encounter (Signed)
MM PAL - moved 10/25 lab/fu to 10/30. Spoke with patient re change and new d/t for 10/30 @ 10:45 am.

## 2017-03-06 ENCOUNTER — Ambulatory Visit (HOSPITAL_COMMUNITY)
Admission: RE | Admit: 2017-03-06 | Discharge: 2017-03-06 | Disposition: A | Discharge status: Home / Self Care | Payer: Medicare Other | Source: Ambulatory Visit | Attending: Internal Medicine | Admitting: Internal Medicine

## 2017-03-06 DIAGNOSIS — E041 Nontoxic single thyroid nodule: Secondary | ICD-10-CM | POA: Diagnosis not present

## 2017-03-06 DIAGNOSIS — C3411 Malignant neoplasm of upper lobe, right bronchus or lung: Secondary | ICD-10-CM | POA: Diagnosis not present

## 2017-03-06 DIAGNOSIS — I700 Atherosclerosis of aorta: Secondary | ICD-10-CM | POA: Insufficient documentation

## 2017-03-06 DIAGNOSIS — R918 Other nonspecific abnormal finding of lung field: Secondary | ICD-10-CM | POA: Diagnosis not present

## 2017-03-06 DIAGNOSIS — C3490 Malignant neoplasm of unspecified part of unspecified bronchus or lung: Secondary | ICD-10-CM

## 2017-03-06 DIAGNOSIS — Z5181 Encounter for therapeutic drug level monitoring: Secondary | ICD-10-CM

## 2017-03-06 DIAGNOSIS — I2510 Atherosclerotic heart disease of native coronary artery without angina pectoris: Secondary | ICD-10-CM | POA: Diagnosis not present

## 2017-03-06 MED ORDER — IOPAMIDOL (ISOVUE-300) INJECTION 61%
75.0000 mL | Freq: Once | INTRAVENOUS | Status: AC | PRN
  Administered 2017-03-06: 75 mL via INTRAVENOUS

## 2017-03-08 ENCOUNTER — Ambulatory Visit: Payer: Medicare Other | Admitting: Internal Medicine

## 2017-03-08 ENCOUNTER — Other Ambulatory Visit: Payer: Medicare Other

## 2017-03-09 DIAGNOSIS — I482 Chronic atrial fibrillation: Secondary | ICD-10-CM | POA: Diagnosis not present

## 2017-03-09 DIAGNOSIS — I5032 Chronic diastolic (congestive) heart failure: Secondary | ICD-10-CM | POA: Diagnosis not present

## 2017-03-09 DIAGNOSIS — R300 Dysuria: Secondary | ICD-10-CM | POA: Diagnosis not present

## 2017-03-09 DIAGNOSIS — Z6820 Body mass index (BMI) 20.0-20.9, adult: Secondary | ICD-10-CM | POA: Diagnosis not present

## 2017-03-11 ENCOUNTER — Other Ambulatory Visit (INDEPENDENT_AMBULATORY_CARE_PROVIDER_SITE_OTHER): Payer: Self-pay | Admitting: Internal Medicine

## 2017-03-11 ENCOUNTER — Other Ambulatory Visit: Payer: Self-pay | Admitting: Cardiology

## 2017-03-12 ENCOUNTER — Telehealth: Payer: Self-pay | Admitting: *Deleted

## 2017-03-12 NOTE — Telephone Encounter (Signed)
Please give pt a call --she's started taking an antibiotic

## 2017-03-12 NOTE — Telephone Encounter (Signed)
Pt was started on Macrobid 135m x 5 days for UTI.  She will finish tomorrow.  Told pt it does not interfere with coumadin.  She will continue current dose.  She verbalized understanding.

## 2017-03-13 ENCOUNTER — Ambulatory Visit (HOSPITAL_BASED_OUTPATIENT_CLINIC_OR_DEPARTMENT_OTHER): Payer: Medicare Other | Admitting: Internal Medicine

## 2017-03-13 ENCOUNTER — Telehealth: Payer: Self-pay | Admitting: Internal Medicine

## 2017-03-13 ENCOUNTER — Encounter: Payer: Self-pay | Admitting: Internal Medicine

## 2017-03-13 ENCOUNTER — Other Ambulatory Visit (HOSPITAL_BASED_OUTPATIENT_CLINIC_OR_DEPARTMENT_OTHER): Payer: Medicare Other

## 2017-03-13 VITALS — BP 137/75 | HR 64 | Temp 97.8°F | Resp 20 | Ht 63.0 in | Wt 112.1 lb

## 2017-03-13 DIAGNOSIS — C3490 Malignant neoplasm of unspecified part of unspecified bronchus or lung: Secondary | ICD-10-CM

## 2017-03-13 DIAGNOSIS — Z5181 Encounter for therapeutic drug level monitoring: Secondary | ICD-10-CM

## 2017-03-13 DIAGNOSIS — C3411 Malignant neoplasm of upper lobe, right bronchus or lung: Secondary | ICD-10-CM | POA: Diagnosis not present

## 2017-03-13 LAB — COMPREHENSIVE METABOLIC PANEL
ALBUMIN: 3.7 g/dL (ref 3.5–5.0)
ALK PHOS: 88 U/L (ref 40–150)
ALT: 15 U/L (ref 0–55)
AST: 25 U/L (ref 5–34)
Anion Gap: 7 mEq/L (ref 3–11)
BUN: 16.1 mg/dL (ref 7.0–26.0)
CO2: 28 meq/L (ref 22–29)
Calcium: 9.1 mg/dL (ref 8.4–10.4)
Chloride: 104 mEq/L (ref 98–109)
Creatinine: 0.7 mg/dL (ref 0.6–1.1)
EGFR: >60 mL/min/{1.73_m2} (ref >60)
GLUCOSE: 93 mg/dL (ref 70–140)
POTASSIUM: 4.2 meq/L (ref 3.5–5.1)
SODIUM: 138 meq/L (ref 136–145)
TOTAL PROTEIN: 7 g/dL (ref 6.4–8.3)
Total Bilirubin: 0.66 mg/dL (ref 0.20–1.20)

## 2017-03-13 LAB — CBC WITH DIFFERENTIAL/PLATELET
BASO%: 1.4 % (ref 0.0–2.0)
Basophils Absolute: 0.1 10*3/uL (ref 0.0–0.1)
EOS%: 6.9 % (ref 0.0–7.0)
Eosinophils Absolute: 0.3 10*3/uL (ref 0.0–0.5)
HCT: 44.1 % (ref 34.8–46.6)
HGB: 14.5 g/dL (ref 11.6–15.9)
LYMPH%: 19.8 % (ref 14.0–49.7)
MCH: 30.3 pg (ref 25.1–34.0)
MCHC: 32.8 g/dL (ref 31.5–36.0)
MCV: 92.6 fL (ref 79.5–101.0)
MONO#: 0.7 10*3/uL (ref 0.1–0.9)
MONO%: 18.2 % — AB (ref 0.0–14.0)
NEUT%: 53.7 % (ref 38.4–76.8)
NEUTROS ABS: 2 10*3/uL (ref 1.5–6.5)
Platelets: 229 10*3/uL (ref 145–400)
RBC: 4.76 10*6/uL (ref 3.70–5.45)
RDW: 13.6 % (ref 11.2–14.5)
WBC: 3.7 10*3/uL — AB (ref 3.9–10.3)
lymph#: 0.7 10*3/uL — ABNORMAL LOW (ref 0.9–3.3)

## 2017-03-13 NOTE — Telephone Encounter (Signed)
Gave avs and calendar for November

## 2017-03-13 NOTE — Progress Notes (Signed)
[facility] Telephone:(336) [phone]   Fax:(336) [phone]  OFFICE PROGRESS NOTE  [name], MD [place] [address]  DIAGNOSIS: Recurrent non-small cell lung cancer presented with bilateral lung nodules in October 2016 initially diagnosed as a stage IA adenocarcinoma with positive EGFR mutation with deletion in exon 68 in December 2012.  PRIOR THERAPY: Status post bronchoscopy with right VATS and right upper lobectomy with lymph node dissection under the care of Dr. Servando Snare on 05/31/2011.  CURRENT THERAPY: Iressa 250 mg by mouth daily started 04/17/2015, status post 22 months of treatment.  INTERVAL HISTORY: Natalie Carlson 83 y.o. female returns to the clinic today for follow-up visit accompanied by her friend.  The patient has no complaints today.  She denied having any skin rash or diarrhea.  She denied having chest pain, shortness of breath, cough or hemoptysis.  She denied having any fever or chills.  She has no nausea, vomiting, constipation.  She has no significant weight loss or night sweats.  She has no headache or visual changes.  She has been tolerating her treatment with Iressa fairly well.  The patient had repeat CT scan of the chest performed recently and she is here for evaluation and discussion of her risk results.  MEDICAL HISTORY: Past Medical History:  Diagnosis Date  . Arthritis   . Atrial fibrillation ([place])   . Cataract of both eyes   . Chronic back pain   . Chronic diarrhea   . Colitis   . Hemorrhoids   . History of colon polyps   . History of migraines   . Hyperlipidemia   . Hypothyroidism   . Macular degeneration, dry   . Malignant neoplasm of right upper lobe of lung (HCC)    Stage 1A  EGFR positive  Deletion in exon 19.  ALK-negative  Right Upper Lobe Lung Mass  SUV 2.9 on PET Status post VATS and right upper lobe lobectomy 05/31/2011  . Mucus in stool   . Nephrolithiasis    1994 - passed on its on    ALLERGIES:  is  allergic to doxycycline; entocort ec [budesonide]; neurontin [gabapentin]; vicodin [hydrocodone-acetaminophen]; adhesive [tape]; amoxicillin; chocolate; codeine; levaquin [levofloxacin in d5w]; morphine and related; naprosyn [naproxen]; penicillins; prednisone; tramadol; and uncoded nonscreenable allergen.  MEDICATIONS:  Current Outpatient Prescriptions  Medication Sig Dispense Refill  . APRISO 0.375 g 24 hr capsule TAKE 2 CAPSULE BY MOUTH TWICE DAILY 120 capsule 11  . Cholecalciferol 2000 UNITS CAPS Take 2,000 Units by mouth daily.     . diclofenac sodium (VOLTAREN) 1 % GEL Apply 1 application topically 2 (two) times daily as needed (for neck pain).     Marland Kitchen diltiazem (CARDIZEM CD) 180 MG 24 hr capsule TAKE 1 CAPSULE BY MOUTH EVERY DAY (DOSE INCREASE) 30 capsule 6  . furosemide (LASIX) 20 MG tablet TAKE 1 TABLET BY MOUTH EVERY DAY AS NEEDED FOR FLUID (Patient taking differently: TAKE 1 TABLET BY MOUTH EVERY DAY AS NEEDED FOR FLUID (Takes Tueday, Thursday, and Saturday)) 30 tablet 5  . gefitinib (IRESSA) 250 MG tablet Take 1 tablet (250 mg total) by mouth daily. 30 tablet 5  . LUTEIN PO Take 1 capsule by mouth daily. Reported on 10/27/2015    . magnesium 30 MG tablet Take 30 mg by mouth every morning.    . Multiple Vitamins-Minerals (AIRBORNE) CHEW Chew 1 tablet by mouth daily.    . Multiple Vitamins-Minerals (HAIR/SKIN/NAILS) TABS Take 1 tablet by  mouth daily.    . nitrofurantoin, macrocrystal-monohydrate, (MACROBID) 100 MG capsule Take 100 mg by mouth 2 (two) times daily.  0  . Omega-3 Fatty Acids (FISH OIL) 1200 MG CAPS Take 1,200 mg by mouth daily.    . Polyvinyl Alcohol-Povidone (REFRESH OP) Place 1 drop into both eyes as needed (for dry eyes).    . potassium chloride (K-DUR,KLOR-CON) 10 MEQ tablet Take 1 tablet (10 mEq total) by mouth daily. 30 tablet 6  . warfarin (COUMADIN) 5 MG tablet TAKE 1 TABLET BY MOUTH DAILY EXCEPT 1 AND 1/2 TABLETS ON TUESDAYS AND FRIDAYS. 40 tablet 6  . HYDROmorphone  (DILAUDID) 4 MG tablet Take 1 tablet (4 mg total) by mouth every 6 (six) hours as needed for severe pain. (Patient not taking: Reported on 03/13/2017) 20 tablet 0  . ondansetron (ZOFRAN ODT) 4 MG disintegrating tablet Take 1 tablet (4 mg total) by mouth every 8 (eight) hours as needed. (Patient not taking: Reported on 02/27/2017) 10 tablet 1   No current facility-administered medications for this visit.     SURGICAL HISTORY:  Past Surgical History:  Procedure Laterality Date  . Bladder tack  1996  . CARDIOVERSION  11/02/09   x 2  . CATARACT EXTRACTION  2010/2011  . COLONOSCOPY  3 YRS AGO  . COLONOSCOPY  03/06/2011   Procedure: COLONOSCOPY;  Surgeon: Rogene Houston, MD;  [facility];  Service: Endoscopy;  Laterality: N/A;  7:30 am  . ESOPHAGOGASTRODUODENOSCOPY    . FLEXIBLE SIGMOIDOSCOPY N/A 10/04/2012   Procedure: FLEXIBLE SIGMOIDOSCOPY;  Surgeon: Rogene Houston, MD;  [facility]: [facility];  Service: Endoscopy;  Laterality: N/A;  11:00-moved to 1015 Ann to notify pt  . THYROID SURGERY  1961  . TONSILLECTOMY    . VIDEO BRONCHOSCOPY  05/31/2011   Procedure: VIDEO BRONCHOSCOPY;  Surgeon: Grace Isaac, MD;  Location: [facility] OR;  Service: Thoracic;  Laterality: N/A;    REVIEW OF SYSTEMS:  Constitutional: negative Eyes: negative Ears, nose, mouth, throat, and face: negative Respiratory: negative Cardiovascular: negative Gastrointestinal: negative Genitourinary:negative Integument/breast: negative Hematologic/lymphatic: negative Musculoskeletal:negative Neurological: negative Behavioral/Psych: negative Endocrine: negative Allergic/Immunologic: negative   PHYSICAL EXAMINATION: General appearance: alert, cooperative and no distress Head: Normocephalic, without obvious abnormality, atraumatic Neck: no adenopathy, no JVD, supple, symmetrical, trachea midline and thyroid not enlarged, symmetric, no tenderness/mass/nodules Lymph nodes: Cervical, supraclavicular, and  axillary nodes normal. Resp: clear to auscultation bilaterally Back: symmetric, no curvature. ROM normal. No CVA tenderness. Cardio: regular rate and rhythm, S1, S2 normal, no murmur, click, rub or gallop GI: soft, non-tender; bowel sounds normal; no masses,  no organomegaly Extremities: extremities normal, atraumatic, no cyanosis or edema Neurologic: Alert and oriented X 3, normal strength and tone. Normal symmetric reflexes. Normal coordination and gait  ECOG PERFORMANCE STATUS: 0 - Asymptomatic  Blood pressure 137/75, pulse 64, temperature 97.8 F (36.6 C), temperature source Oral, resp. rate 20, height 5' 3" (1.6 m), weight 112 lb 1.6 oz (50.8 kg), SpO2 99 %.  LABORATORY DATA: Lab Results  Component Value Date   WBC 3.7 (L) 03/13/2017   HGB 14.5 03/13/2017   HCT 44.1 03/13/2017   MCV 92.6 03/13/2017   PLT 229 03/13/2017      Chemistry      Component Value Date/Time   NA 142 02/06/2017 1306   K 4.0 02/06/2017 1306   CL 102 09/26/2016 0720   CO2 28 02/06/2017 1306   BUN 13.4 02/06/2017 1306   CREATININE 0.7 02/06/2017 1306  Component Value Date/Time   CALCIUM 9.2 02/06/2017 1306   ALKPHOS 91 02/06/2017 1306   AST 27 02/06/2017 1306   ALT 13 02/06/2017 1306   BILITOT 0.58 02/06/2017 1306       RADIOGRAPHIC STUDIES: Ct Chest W Contrast  Result Date: 03/06/2017 CLINICAL DATA:  Restaging non-small cell lung ca; surg/chemo/ pt states she is currently taking immunotherapy; no chest complaints today; chronic sob^43m ISOVUE-300 IOPAMIDOL (ISOVUE-300) INJECTION 61% EXAM: CT CHEST WITH CONTRAST TECHNIQUE: Multidetector CT imaging of the chest was performed during intravenous contrast administration. CONTRAST:  780mISOVUE-300 IOPAMIDOL (ISOVUE-300) INJECTION 61% COMPARISON:  CT 11/28/2016, 08/23/2016 FINDINGS: Cardiovascular: Coronary artery calcification and aortic atherosclerotic calcification. Mediastinum/Nodes: No axillary or supraclavicular adenopathy. Nodules of the  thyroid gland enlargement. Note mediastinal hilar adenopathy. Lungs/Pleura: Linear nodular thickening at the RIGHT lung apex is unchanged. No new nodularity. Airways normal Upper Abdomen: Limited view of the liver, kidneys, pancreas are unremarkable. Normal adrenal glands. Musculoskeletal: No aggressive osseous lesion. IMPRESSION: 1. Stable nodularity in in the RIGHT upper lobe. 2. No evidence of lung cancer recurrence. 3.  Aortic Atherosclerosis (ICD10-I70.0). Electronically Signed   By: StSuzy Bouchard.D.   On: 03/06/2017 16:26    ASSESSMENT AND PLAN:  This is a very pleasant [age]ears old white female with recurrent non-small cell lung cancer, adenocarcinoma with positive EGFR mutation. The patient was started on treatment with Iressa 250 mg by mouth daily status post 22 months of treatment. The patient is doing very well today and continues to tolerate her treatment with Iressa fairly well. She had a repeat CT scan of the chest performed recently.  I personally and independently reviewed the scans and discussed the results with the patient today. HIDA scan showed no evidence for disease progression.  I recommended for the patient to continue her current treatment with Iressa with the same dose. I will see her back for follow-up visit in 1 month for evaluation after repeating CBC and comprehensive metabolic panel for close monitoring of her treatment. The patient was advised to call immediately if she has any concerning symptoms in the interval. The patient voices understanding of current disease status and treatment options and is in agreement with the current care plan. All questions were answered. The patient knows to call the clinic with any problems, questions or concerns. We can certainly see the patient much sooner if necessary.  Disclaimer: This note was dictated with voice recognition software. Similar sounding words can inadvertently be transcribed and may not be corrected upon  review.

## 2017-03-15 ENCOUNTER — Telehealth: Payer: Self-pay | Admitting: Medical Oncology

## 2017-03-15 NOTE — Telephone Encounter (Signed)
Updated med list faxed to [facility]

## 2017-03-16 DIAGNOSIS — H16223 Keratoconjunctivitis sicca, not specified as Sjogren's, bilateral: Secondary | ICD-10-CM | POA: Diagnosis not present

## 2017-03-16 DIAGNOSIS — H353131 Nonexudative age-related macular degeneration, bilateral, early dry stage: Secondary | ICD-10-CM | POA: Diagnosis not present

## 2017-03-16 DIAGNOSIS — Z961 Presence of intraocular lens: Secondary | ICD-10-CM | POA: Diagnosis not present

## 2017-03-20 DIAGNOSIS — M810 Age-related osteoporosis without current pathological fracture: Secondary | ICD-10-CM | POA: Diagnosis not present

## 2017-03-26 DIAGNOSIS — H353112 Nonexudative age-related macular degeneration, right eye, intermediate dry stage: Secondary | ICD-10-CM | POA: Diagnosis not present

## 2017-03-26 DIAGNOSIS — H35371 Puckering of macula, right eye: Secondary | ICD-10-CM | POA: Diagnosis not present

## 2017-03-26 DIAGNOSIS — H43813 Vitreous degeneration, bilateral: Secondary | ICD-10-CM | POA: Diagnosis not present

## 2017-03-26 DIAGNOSIS — H353221 Exudative age-related macular degeneration, left eye, with active choroidal neovascularization: Secondary | ICD-10-CM | POA: Diagnosis not present

## 2017-03-27 DIAGNOSIS — I5032 Chronic diastolic (congestive) heart failure: Secondary | ICD-10-CM | POA: Diagnosis not present

## 2017-03-27 DIAGNOSIS — C3411 Malignant neoplasm of upper lobe, right bronchus or lung: Secondary | ICD-10-CM | POA: Diagnosis not present

## 2017-03-27 DIAGNOSIS — Z0001 Encounter for general adult medical examination with abnormal findings: Secondary | ICD-10-CM | POA: Diagnosis not present

## 2017-03-27 DIAGNOSIS — H35342 Macular cyst, hole, or pseudohole, left eye: Secondary | ICD-10-CM | POA: Diagnosis not present

## 2017-03-27 DIAGNOSIS — K5100 Ulcerative (chronic) pancolitis without complications: Secondary | ICD-10-CM | POA: Diagnosis not present

## 2017-03-27 DIAGNOSIS — I482 Chronic atrial fibrillation: Secondary | ICD-10-CM | POA: Diagnosis not present

## 2017-03-27 DIAGNOSIS — Z681 Body mass index (BMI) 19 or less, adult: Secondary | ICD-10-CM | POA: Diagnosis not present

## 2017-04-06 ENCOUNTER — Telehealth: Payer: Self-pay | Admitting: Internal Medicine

## 2017-04-06 NOTE — Telephone Encounter (Signed)
MM PAL - moved from MM to [facility]. Spoke with patient re change and new time for 11/29 at 11am.

## 2017-04-09 ENCOUNTER — Telehealth: Payer: Self-pay | Admitting: *Deleted

## 2017-04-09 NOTE — Telephone Encounter (Signed)
Please give pt a call on cell phone [phone]

## 2017-04-09 NOTE — Telephone Encounter (Signed)
Spoke with pt.  She needs to reschedule INR appt 59/27 due to conflict.  Appt moved to 04/17/17.  Pt in agreement.

## 2017-04-12 ENCOUNTER — Other Ambulatory Visit (HOSPITAL_BASED_OUTPATIENT_CLINIC_OR_DEPARTMENT_OTHER): Payer: Medicare Other

## 2017-04-12 ENCOUNTER — Ambulatory Visit (HOSPITAL_BASED_OUTPATIENT_CLINIC_OR_DEPARTMENT_OTHER): Payer: Medicare Other | Admitting: Oncology

## 2017-04-12 ENCOUNTER — Telehealth: Payer: Self-pay | Admitting: Oncology

## 2017-04-12 ENCOUNTER — Encounter: Payer: Self-pay | Admitting: Oncology

## 2017-04-12 VITALS — BP 143/70 | HR 74 | Temp 97.0°F | Resp 17 | Ht 63.0 in | Wt 111.4 lb

## 2017-04-12 DIAGNOSIS — Z5181 Encounter for therapeutic drug level monitoring: Secondary | ICD-10-CM

## 2017-04-12 DIAGNOSIS — C3411 Malignant neoplasm of upper lobe, right bronchus or lung: Secondary | ICD-10-CM

## 2017-04-12 LAB — COMPREHENSIVE METABOLIC PANEL
ALT: 14 U/L (ref 0–55)
ANION GAP: 7 meq/L (ref 3–11)
AST: 24 U/L (ref 5–34)
Albumin: 3.6 g/dL (ref 3.5–5.0)
Alkaline Phosphatase: 92 U/L (ref 40–150)
BUN: 14.9 mg/dL (ref 7.0–26.0)
CO2: 27 mEq/L (ref 22–29)
Calcium: 9 mg/dL (ref 8.4–10.4)
Chloride: 105 mEq/L (ref 98–109)
Creatinine: 0.8 mg/dL (ref 0.6–1.1)
GLUCOSE: 91 mg/dL (ref 70–140)
POTASSIUM: 4.5 meq/L (ref 3.5–5.1)
SODIUM: 139 meq/L (ref 136–145)
Total Bilirubin: 0.6 mg/dL (ref 0.20–1.20)
Total Protein: 7 g/dL (ref 6.4–8.3)

## 2017-04-12 LAB — CBC WITH DIFFERENTIAL/PLATELET
BASO%: 1.2 % (ref 0.0–2.0)
Basophils Absolute: 0 10*3/uL (ref 0.0–0.1)
EOS%: 5.8 % (ref 0.0–7.0)
Eosinophils Absolute: 0.2 10*3/uL (ref 0.0–0.5)
HEMATOCRIT: 43.4 % (ref 34.8–46.6)
HEMOGLOBIN: 14.2 g/dL (ref 11.6–15.9)
LYMPH#: 0.9 10*3/uL (ref 0.9–3.3)
LYMPH%: 26.1 % (ref 14.0–49.7)
MCH: 30.3 pg (ref 25.1–34.0)
MCHC: 32.7 g/dL (ref 31.5–36.0)
MCV: 92.7 fL (ref 79.5–101.0)
MONO#: 0.6 10*3/uL (ref 0.1–0.9)
MONO%: 16 % — ABNORMAL HIGH (ref 0.0–14.0)
NEUT%: 50.9 % (ref 38.4–76.8)
NEUTROS ABS: 1.8 10*3/uL (ref 1.5–6.5)
PLATELETS: 225 10*3/uL (ref 145–400)
RBC: 4.68 10*6/uL (ref 3.70–5.45)
RDW: 13.9 % (ref 11.2–14.5)
WBC: 3.6 10*3/uL — ABNORMAL LOW (ref 3.9–10.3)

## 2017-04-12 NOTE — Progress Notes (Signed)
[name] OFFICE PROGRESS NOTE  [name], MD [place] [address]  DIAGNOSIS: Recurrent non-small cell lung cancer presented with bilateral lung nodules in October 2016 initially diagnosed as a stage IA adenocarcinoma with positive EGFR mutation with deletion in exon 69 in December 2012.  PRIOR THERAPY: Status post bronchoscopy with right VATS and right upper lobectomy with lymph node dissection under the care of Dr. Servando Snare on 05/31/2011.  CURRENT THERAPY: Iressa 250 mg by mouth daily started 04/17/2015, status post 23 months of treatment.  INTERVAL HISTORY: Natalie Carlson 83 y.o. female returns for routine follow-up visit accompanied by her friend.  The patient has no complaints today except for occasional itching.  This resolves when she takes a shower and puts moisturizer on.  She denies having any rash.  Denies fevers and chills.  Denies chest pain, shortness breath, cough, hemoptysis.  Denies nausea, vomiting, constipation.  She reports infrequent diarrhea about once a month.  She uses Imodium which is effective.  The patient continues to tolerate her Iressa fairly well.  The patient is here for evaluation and repeat lab work.  MEDICAL HISTORY: Past Medical History:  Diagnosis Date  . Arthritis   . Atrial fibrillation ([place])   . Cataract of both eyes   . Chronic back pain   . Chronic diarrhea   . Colitis   . Hemorrhoids   . History of colon polyps   . History of migraines   . Hyperlipidemia   . Hypothyroidism   . Macular degeneration, dry   . Malignant neoplasm of right upper lobe of lung (HCC)    Stage 1A  EGFR positive  Deletion in exon 19.  ALK-negative  Right Upper Lobe Lung Mass  SUV 2.9 on PET Status post VATS and right upper lobe lobectomy 05/31/2011  . Mucus in stool   . Nephrolithiasis    1994 - passed on its on    ALLERGIES:  is allergic to doxycycline; entocort ec [budesonide]; neurontin [gabapentin]; vicodin  [hydrocodone-acetaminophen]; adhesive [tape]; amoxicillin; chocolate; codeine; levaquin [levofloxacin in d5w]; morphine and related; naprosyn [naproxen]; penicillins; prednisone; tramadol; and uncoded nonscreenable allergen.  MEDICATIONS:  Current Outpatient Medications  Medication Sig Dispense Refill  . APRISO 0.375 g 24 hr capsule TAKE 2 CAPSULE BY MOUTH TWICE DAILY 120 capsule 11  . Calcium Carbonate (CALCIUM 600 PO) Take 1 tablet by mouth 2 (two) times daily.    . Cholecalciferol 2000 UNITS CAPS Take 2,000 Units by mouth daily.     . diclofenac sodium (VOLTAREN) 1 % GEL Apply 1 application topically 2 (two) times daily as needed (for neck pain).     Marland Kitchen diltiazem (CARDIZEM CD) 180 MG 24 hr capsule TAKE 1 CAPSULE BY MOUTH EVERY DAY (DOSE INCREASE) 30 capsule 6  . furosemide (LASIX) 20 MG tablet TAKE 1 TABLET BY MOUTH EVERY DAY AS NEEDED FOR FLUID (Patient taking differently: TAKE 1 TABLET BY MOUTH EVERY DAY AS NEEDED FOR FLUID (Takes Tueday, Thursday, and Saturday)) 30 tablet 5  . gefitinib (IRESSA) 250 MG tablet Take 1 tablet (250 mg total) by mouth daily. 30 tablet 5  . HYDROmorphone (DILAUDID) 4 MG tablet Take 1 tablet (4 mg total) by mouth every 6 (six) hours as needed for severe pain. 20 tablet 0  . LUTEIN PO Take 1 capsule by mouth daily. Reported on 10/27/2015    . magnesium 30 MG tablet Take 30 mg by mouth every morning.    . Multiple Vitamins-Minerals (AIRBORNE)  CHEW Chew 1 tablet by mouth daily.    . Multiple Vitamins-Minerals (HAIR/SKIN/NAILS) TABS Take 1 tablet by mouth daily.    . Omega-3 Fatty Acids (FISH OIL) 1200 MG CAPS Take 1,200 mg by mouth daily.    . ondansetron (ZOFRAN ODT) 4 MG disintegrating tablet Take 1 tablet (4 mg total) by mouth every 8 (eight) hours as needed. 10 tablet 1  . Polyvinyl Alcohol-Povidone (REFRESH OP) Place 1 drop into both eyes as needed (for dry eyes).    . potassium chloride (K-DUR,KLOR-CON) 10 MEQ tablet Take 1 tablet (10 mEq total) by mouth daily.  30 tablet 6  . warfarin (COUMADIN) 5 MG tablet TAKE 1 TABLET BY MOUTH DAILY EXCEPT 1 AND 1/2 TABLETS ON TUESDAYS AND FRIDAYS. 40 tablet 6  . nitrofurantoin, macrocrystal-monohydrate, (MACROBID) 100 MG capsule Take 100 mg by mouth 2 (two) times daily.  0   No current facility-administered medications for this visit.     SURGICAL HISTORY:  Past Surgical History:  Procedure Laterality Date  . Bladder tack  1996  . CARDIOVERSION  11/02/09   x 2  . CATARACT EXTRACTION  2010/2011  . COLONOSCOPY  3 YRS AGO  . COLONOSCOPY  03/06/2011   Procedure: COLONOSCOPY;  Surgeon: Rogene Houston, MD;  [facility];  Service: Endoscopy;  Laterality: N/A;  7:30 am  . ESOPHAGOGASTRODUODENOSCOPY    . FLEXIBLE SIGMOIDOSCOPY N/A 10/04/2012   Procedure: FLEXIBLE SIGMOIDOSCOPY;  Surgeon: Rogene Houston, MD;  [facility];  Service: Endoscopy;  Laterality: N/A;  11:00-moved to 1015 Ann to notify pt  . THYROID SURGERY  1961  . TONSILLECTOMY    . VIDEO BRONCHOSCOPY  05/31/2011   Procedure: VIDEO BRONCHOSCOPY;  Surgeon: Grace Isaac, MD;  Location: [facility] OR;  Service: Thoracic;  Laterality: N/A;    REVIEW OF SYSTEMS:   Review of Systems  Constitutional: Negative for appetite change, chills, fatigue, fever and unexpected weight change.  HENT:   Negative for mouth sores, nosebleeds, sore throat and trouble swallowing.   Eyes: Negative for eye problems and icterus.  Respiratory: Negative for cough, hemoptysis, shortness of breath and wheezing.   Cardiovascular: Negative for chest pain and leg swelling.  Gastrointestinal: Negative for abdominal pain, constipation, diarrhea, nausea and vomiting.  Genitourinary: Negative for bladder incontinence, difficulty urinating, dysuria, frequency and hematuria.   Musculoskeletal: Negative for back pain, gait problem, neck pain and neck stiffness.  Skin: Negative for rash. Positive for itching. Neurological: Negative for dizziness, extremity weakness,  gait problem, headaches, light-headedness and seizures.  Hematological: Negative for adenopathy. Does not bruise/bleed easily.  Psychiatric/Behavioral: Negative for confusion, depression and sleep disturbance. The patient is not nervous/anxious.     PHYSICAL EXAMINATION:  Blood pressure (!) 143/70, pulse 74, temperature (!) 97 F (36.1 C), temperature source Oral, resp. rate 17, height _0  (1.6 m), weight 111 lb 6.4 oz (50.5 kg), SpO2 98 %, peak flow 98 L/min.  ECOG PERFORMANCE STATUS: 1 - Symptomatic but completely ambulatory  Physical Exam  Constitutional: Oriented to person, place, and time and well-developed, well-nourished, and in no distress. No distress.  HENT:  Head: Normocephalic and atraumatic.  Mouth/Throat: Oropharynx is clear and moist. No oropharyngeal exudate.  Eyes: Conjunctivae are normal. Right eye exhibits no discharge. Left eye exhibits no discharge. No scleral icterus.  Neck: Normal range of motion. Neck supple.  Cardiovascular: Normal rate, regular rhythm, normal heart sounds and intact distal pulses.   Pulmonary/Chest: Effort normal and breath sounds normal. No respiratory  distress. No wheezes. No rales.  Abdominal: Soft. Bowel sounds are normal. Exhibits no distension and no mass. There is no tenderness.  Musculoskeletal: Normal range of motion. Exhibits no edema.  Lymphadenopathy:    No cervical adenopathy.  Neurological: Alert and oriented to person, place, and time. Exhibits normal muscle tone. Gait normal. Coordination normal.  Skin: Skin is warm and dry. No rash noted. Not diaphoretic. No erythema. No pallor.  Psychiatric: Mood, memory and judgment normal.  Vitals reviewed.  LABORATORY DATA: Lab Results  Component Value Date   WBC 3.6 (L) 04/12/2017   HGB 14.2 04/12/2017   HCT 43.4 04/12/2017   MCV 92.7 04/12/2017   PLT 225 04/12/2017      Chemistry      Component Value Date/Time   NA 139 04/12/2017 1043   K 4.5 04/12/2017 1043   CL 102  09/26/2016 0720   CO2 27 04/12/2017 1043   BUN 14.9 04/12/2017 1043   CREATININE 0.8 04/12/2017 1043      Component Value Date/Time   CALCIUM 9.0 04/12/2017 1043   ALKPHOS 92 04/12/2017 1043   AST 24 04/12/2017 1043   ALT 14 04/12/2017 1043   BILITOT 0.60 04/12/2017 1043       RADIOGRAPHIC STUDIES:  No results found.   ASSESSMENT/PLAN:  Malignant neoplasm of right upper lobe of lung [facility]) This is a very pleasant 83 year old white female with recurrent non-small cell lung cancer, adenocarcinoma with positive EGFR mutation. The patient was started on treatment with Iressa 250 mg by mouth daily status post 23 months of treatment. The patient is doing very well today and continues to tolerate her treatment with Iressa fairly well. Labs were reviewed with the patient and her friend and are overall stable.  Recommend that she continue treatment with Iressa.  She will have a follow-up visit in 1 month with a repeat CBC and CMET for close monitoring of her treatment.  The patient was advised to call immediately if she has any concerning symptoms in the interval. The patient voices understanding of current disease status and treatment options and is in agreement with the current care plan. All questions were answered. The patient knows to call the clinic with any problems, questions or concerns. We can certainly see the patient much sooner if necessary.  Orders Placed This Encounter  Procedures  . CBC with Differential/Platelet    Standing Status:   Future    Standing Expiration Date:   04/12/2018  . Comprehensive metabolic panel    Standing Status:   Future    Standing Expiration Date:   04/12/2018    Mikey Bussing, DNP, AGPCNP-BC, AOCNP 04/12/17

## 2017-04-12 NOTE — Assessment & Plan Note (Signed)
This is a very pleasant 83 year old white female with recurrent non-small cell lung cancer, adenocarcinoma with positive EGFR mutation. The patient was started on treatment with Iressa 250 mg by mouth daily status post 23 months of treatment. The patient is doing very well today and continues to tolerate her treatment with Iressa fairly well. Labs were reviewed with the patient and her friend and are overall stable.  Recommend that she continue treatment with Iressa.  She will have a follow-up visit in 1 month with a repeat CBC and CMET for close monitoring of her treatment.  The patient was advised to call immediately if she has any concerning symptoms in the interval. The patient voices understanding of current disease status and treatment options and is in agreement with the current care plan. All questions were answered. The patient knows to call the clinic with any problems, questions or concerns. We can certainly see the patient much sooner if necessary.

## 2017-04-12 NOTE — Telephone Encounter (Signed)
Scheduled appt per 11/29 los - Gave patient AVS and calender per los.

## 2017-04-17 ENCOUNTER — Ambulatory Visit (INDEPENDENT_AMBULATORY_CARE_PROVIDER_SITE_OTHER): Payer: Medicare Other | Admitting: *Deleted

## 2017-04-17 DIAGNOSIS — Z5181 Encounter for therapeutic drug level monitoring: Secondary | ICD-10-CM

## 2017-04-17 DIAGNOSIS — I4891 Unspecified atrial fibrillation: Secondary | ICD-10-CM

## 2017-04-17 LAB — POCT INR: INR: 2.4

## 2017-04-19 ENCOUNTER — Telehealth: Payer: Self-pay | Admitting: Pharmacy Technician

## 2017-04-19 NOTE — Telephone Encounter (Signed)
Oral Oncology Patient Advocate Encounter  Met patient in [place] to complete a renewal application for [place] Patient Assistance Program in an effort to keep the patient's out of pocket expense for Iressa at $0.    Application completed and faxed to [phone].   [place] patient [place] phone number for follow up is [phone].   This encounter will be updated until final determination.  Fabio Asa. Melynda Keller, [place] Patient [place] [phone] 04/19/2017 3:57 PM

## 2017-04-26 DIAGNOSIS — H353112 Nonexudative age-related macular degeneration, right eye, intermediate dry stage: Secondary | ICD-10-CM | POA: Diagnosis not present

## 2017-04-26 DIAGNOSIS — H43813 Vitreous degeneration, bilateral: Secondary | ICD-10-CM | POA: Diagnosis not present

## 2017-04-26 DIAGNOSIS — H353221 Exudative age-related macular degeneration, left eye, with active choroidal neovascularization: Secondary | ICD-10-CM | POA: Diagnosis not present

## 2017-04-26 DIAGNOSIS — H35371 Puckering of macula, right eye: Secondary | ICD-10-CM | POA: Diagnosis not present

## 2017-05-10 ENCOUNTER — Other Ambulatory Visit: Payer: Self-pay | Admitting: Cardiology

## 2017-05-14 ENCOUNTER — Ambulatory Visit (HOSPITAL_BASED_OUTPATIENT_CLINIC_OR_DEPARTMENT_OTHER): Payer: Medicare Other | Admitting: Internal Medicine

## 2017-05-14 ENCOUNTER — Other Ambulatory Visit (HOSPITAL_BASED_OUTPATIENT_CLINIC_OR_DEPARTMENT_OTHER): Payer: Medicare Other

## 2017-05-14 ENCOUNTER — Telehealth: Payer: Self-pay | Admitting: Internal Medicine

## 2017-05-14 ENCOUNTER — Encounter: Payer: Self-pay | Admitting: Internal Medicine

## 2017-05-14 DIAGNOSIS — C3411 Malignant neoplasm of upper lobe, right bronchus or lung: Secondary | ICD-10-CM | POA: Diagnosis not present

## 2017-05-14 DIAGNOSIS — C3490 Malignant neoplasm of unspecified part of unspecified bronchus or lung: Secondary | ICD-10-CM

## 2017-05-14 LAB — CBC WITH DIFFERENTIAL/PLATELET
BASO%: 0.4 % (ref 0.0–2.0)
BASOS ABS: 0 10*3/uL (ref 0.0–0.1)
EOS ABS: 0.2 10*3/uL (ref 0.0–0.5)
EOS%: 4 % (ref 0.0–7.0)
HCT: 42.3 % (ref 34.8–46.6)
HGB: 13.8 g/dL (ref 11.6–15.9)
LYMPH%: 25.1 % (ref 14.0–49.7)
MCH: 30.7 pg (ref 25.1–34.0)
MCHC: 32.6 g/dL (ref 31.5–36.0)
MCV: 94 fL (ref 79.5–101.0)
MONO#: 0.5 10*3/uL (ref 0.1–0.9)
MONO%: 10 % (ref 0.0–14.0)
NEUT#: 3 10*3/uL (ref 1.5–6.5)
NEUT%: 60.5 % (ref 38.4–76.8)
Platelets: 237 10*3/uL (ref 145–400)
RBC: 4.5 10*6/uL (ref 3.70–5.45)
RDW: 13.8 % (ref 11.2–14.5)
WBC: 5 10*3/uL (ref 3.9–10.3)
lymph#: 1.3 10*3/uL (ref 0.9–3.3)

## 2017-05-14 LAB — COMPREHENSIVE METABOLIC PANEL
ALT: 15 U/L (ref 0–55)
AST: 25 U/L (ref 5–34)
Albumin: 3.6 g/dL (ref 3.5–5.0)
Alkaline Phosphatase: 86 U/L (ref 40–150)
Anion Gap: 6 mEq/L (ref 3–11)
BUN: 19 mg/dL (ref 7.0–26.0)
CALCIUM: 9.1 mg/dL (ref 8.4–10.4)
CHLORIDE: 105 meq/L (ref 98–109)
CO2: 30 meq/L — AB (ref 22–29)
CREATININE: 0.8 mg/dL (ref 0.6–1.1)
EGFR: >60 mL/min/{1.73_m2} (ref >60)
GLUCOSE: 126 mg/dL (ref 70–140)
Potassium: 4.3 mEq/L (ref 3.5–5.1)
Sodium: 141 mEq/L (ref 136–145)
Total Bilirubin: 0.57 mg/dL (ref 0.20–1.20)
Total Protein: 6.9 g/dL (ref 6.4–8.3)

## 2017-05-14 NOTE — Telephone Encounter (Signed)
Gave patient avs and calendar with appts per 12/31 los.

## 2017-05-14 NOTE — Progress Notes (Signed)
[facility] [phone]   [phone]  OFFICE PROGRESS NOTE  [name], MD [place] [address]  DIAGNOSIS: Recurrent non-small cell lung cancer presented with bilateral lung nodules in October 2016 initially diagnosed as a stage IA adenocarcinoma with positive EGFR mutation with deletion in exon 80 in December 2012.  PRIOR THERAPY: Status post bronchoscopy with right VATS and right upper lobectomy with lymph node dissection under the care of Dr. Servando Snare on 05/31/2011.  CURRENT THERAPY: Iressa 250 mg by mouth daily started 04/17/2015, status post 23 months of treatment.  INTERVAL HISTORY: MAYSON STERBENZ 83 y.o. female returns to the clinic today for follow-up visit accompanied by her friend.  The patient is feeling fine today with no specific complaints.  She denied having any chest pain, shortness breath, cough or hemoptysis.  She denied having any fever or chills.  She has no nausea, vomiting, diarrhea or constipation.  She continues to tolerate her treatment with Aricept fairly well.  She is here today for evaluation and repeat blood work.  MEDICAL HISTORY: Past Medical History:  Diagnosis Date  . Arthritis   . Atrial fibrillation ([place])   . Cataract of both eyes   . Chronic back pain   . Chronic diarrhea   . Colitis   . Hemorrhoids   . History of colon polyps   . History of migraines   . Hyperlipidemia   . Hypothyroidism   . Macular degeneration, dry   . Malignant neoplasm of right upper lobe of lung (HCC)    Stage 1A  EGFR positive  Deletion in exon 19.  ALK-negative  Right Upper Lobe Lung Mass  SUV 2.9 on PET Status post VATS and right upper lobe lobectomy 05/31/2011  . Mucus in stool   . Nephrolithiasis    1994 - passed on its on    ALLERGIES:  is allergic to doxycycline; entocort ec [budesonide]; neurontin [gabapentin]; vicodin [hydrocodone-acetaminophen]; adhesive [tape]; amoxicillin; chocolate; codeine; levaquin  [levofloxacin in d5w]; morphine and related; naprosyn [naproxen]; penicillins; prednisone; tramadol; and uncoded nonscreenable allergen.  MEDICATIONS:  Current Outpatient Medications  Medication Sig Dispense Refill  . APRISO 0.375 g 24 hr capsule TAKE 2 CAPSULE BY MOUTH TWICE DAILY 120 capsule 11  . Calcium Carbonate (CALCIUM 600 PO) Take 1 tablet by mouth 2 (two) times daily.    Marland Kitchen CARTIA XT 180 MG 24 hr capsule TAKE ONE CAPSULE BY MOUTH EVERY DAY 30 capsule 6  . Cholecalciferol 2000 UNITS CAPS Take 2,000 Units by mouth daily.     . diclofenac sodium (VOLTAREN) 1 % GEL Apply 1 application topically 2 (two) times daily as needed (for neck pain).     Marland Kitchen gefitinib (IRESSA) 250 MG tablet Take 1 tablet (250 mg total) by mouth daily. 30 tablet 5  . LUTEIN PO Take 1 capsule by mouth daily. Reported on 10/27/2015    . magnesium 30 MG tablet Take 30 mg by mouth every morning.    . Multiple Vitamins-Minerals (AIRBORNE) CHEW Chew 1 tablet by mouth daily.    . Multiple Vitamins-Minerals (HAIR/SKIN/NAILS) TABS Take 1 tablet by mouth daily.    . NON FORMULARY Eye In jection every 5-6 weeks.    . Polyvinyl Alcohol-Povidone (REFRESH OP) Place 1 drop into both eyes as needed (for dry eyes).    . warfarin (COUMADIN) 5 MG tablet TAKE 1 TABLET BY MOUTH DAILY EXCEPT 1 AND 1/2 TABLETS ON TUESDAYS AND FRIDAYS. 40 tablet  6  . furosemide (LASIX) 20 MG tablet TAKE 1 TABLET BY MOUTH EVERY DAY AS NEEDED FOR FLUID (Patient not taking: Reported on 05/14/2017) 30 tablet 5  . HYDROmorphone (DILAUDID) 4 MG tablet Take 1 tablet (4 mg total) by mouth every 6 (six) hours as needed for severe pain. (Patient not taking: Reported on 05/14/2017) 20 tablet 0  . Omega-3 Fatty Acids (FISH OIL) 1200 MG CAPS Take 1,200 mg by mouth daily.    . ondansetron (ZOFRAN ODT) 4 MG disintegrating tablet Take 1 tablet (4 mg total) by mouth every 8 (eight) hours as needed. 10 tablet 1  . potassium chloride (K-DUR,KLOR-CON) 10 MEQ tablet Take 1 tablet  (10 mEq total) by mouth daily. (Patient not taking: Reported on 05/14/2017) 30 tablet 6   No current facility-administered medications for this visit.     SURGICAL HISTORY:  Past Surgical History:  Procedure Laterality Date  . Bladder tack  1996  . CARDIOVERSION  11/02/09   x 2  . CATARACT EXTRACTION  2010/2011  . COLONOSCOPY  3 YRS AGO  . COLONOSCOPY  03/06/2011   Procedure: COLONOSCOPY;  Surgeon: Rogene Houston, MD;  [facility];  Service: Endoscopy;  Laterality: N/A;  7:30 am  . ESOPHAGOGASTRODUODENOSCOPY    . FLEXIBLE SIGMOIDOSCOPY N/A 10/04/2012   Procedure: FLEXIBLE SIGMOIDOSCOPY;  Surgeon: Rogene Houston, MD;  Location: [facility];  Service: Endoscopy;  Laterality: N/A;  11:00-moved to [address] to notify pt  . THYROID SURGERY  1961  . TONSILLECTOMY    . VIDEO BRONCHOSCOPY  05/31/2011   Procedure: VIDEO BRONCHOSCOPY;  Surgeon: Grace Isaac, MD;  Location: [facility] OR;  Service: Thoracic;  Laterality: N/A;    REVIEW OF SYSTEMS:  A comprehensive review of systems was negative.   PHYSICAL EXAMINATION: General appearance: alert, cooperative and no distress Head: Normocephalic, without obvious abnormality, atraumatic Neck: no adenopathy, no JVD, supple, symmetrical, trachea midline and thyroid not enlarged, symmetric, no tenderness/mass/nodules Lymph nodes: Cervical, supraclavicular, and axillary nodes normal. Resp: clear to auscultation bilaterally Back: symmetric, no curvature. ROM normal. No CVA tenderness. Cardio: regular rate and rhythm, S1, S2 normal, no murmur, click, rub or gallop GI: soft, non-tender; bowel sounds normal; no masses,  no organomegaly Extremities: extremities normal, atraumatic, no cyanosis or edema  ECOG PERFORMANCE STATUS: 0 - Asymptomatic  Blood pressure 126/65, pulse 77, temperature (!) 97.5 F (36.4 C), temperature source Oral, resp. rate 18, height 5' 3" (1.6 m), weight 111 lb 14.4 oz (50.8 kg), SpO2 98 %.  LABORATORY DATA: Lab  Results  Component Value Date   WBC 5.0 05/14/2017   HGB 13.8 05/14/2017   HCT 42.3 05/14/2017   MCV 94.0 05/14/2017   PLT 237 05/14/2017      Chemistry      Component Value Date/Time   NA 141 05/14/2017 1304   K 4.3 05/14/2017 1304   CL 102 09/26/2016 0720   CO2 30 (H) 05/14/2017 1304   BUN 19.0 05/14/2017 1304   CREATININE 0.8 05/14/2017 1304      Component Value Date/Time   CALCIUM 9.1 05/14/2017 1304   ALKPHOS 86 05/14/2017 1304   AST 25 05/14/2017 1304   ALT 15 05/14/2017 1304   BILITOT 0.57 05/14/2017 1304       RADIOGRAPHIC STUDIES: No results found.  ASSESSMENT AND PLAN:  This is a very pleasant 83 years old white female with recurrent non-small cell lung cancer, adenocarcinoma with positive EGFR mutation. The patient was started on treatment  with Iressa 250 mg by mouth daily status post 23 months of treatment. The patient continues to tolerate this treatment fairly well with no concerning complaints. I recommended for her to proceed with her treatment as a schedule. I will see her back for follow-up visit in 1 month for evaluation after repeating CT scan of the chest for restaging of her disease. She was advised to call immediately if she has any concerning symptoms in the interval. The patient voices understanding of current disease status and treatment options and is in agreement with the current care plan. All questions were answered. The patient knows to call the clinic with any problems, questions or concerns. We can certainly see the patient much sooner if necessary.  Disclaimer: This note was dictated with voice recognition software. Similar sounding words can inadvertently be transcribed and may not be corrected upon review.

## 2017-05-24 NOTE — Telephone Encounter (Signed)
Oral Oncology Patient Advocate Encounter  Received notification from [facility] and Me Patient Assistance program that patient has been successfully re-enrolled into their program to continue to receive Iressa from the manufacturer at $0 out of pocket until 05/14/2018.   I called and left a message for Mrs Koffler.   Oral Oncology Clinic will continue to follow.  Gilmore Laroche, CPhT, [place] Oral Oncology Patient Advocate [phone] 05/24/2017 1:56 PM

## 2017-05-29 ENCOUNTER — Ambulatory Visit (INDEPENDENT_AMBULATORY_CARE_PROVIDER_SITE_OTHER): Payer: Medicare Other | Admitting: *Deleted

## 2017-05-29 DIAGNOSIS — Z5181 Encounter for therapeutic drug level monitoring: Secondary | ICD-10-CM

## 2017-05-29 DIAGNOSIS — I4891 Unspecified atrial fibrillation: Secondary | ICD-10-CM | POA: Diagnosis not present

## 2017-05-29 LAB — POCT INR: INR: 3

## 2017-05-29 NOTE — Patient Instructions (Signed)
Continue coumadin 1 tablet daily except 1 1/2 tablets on Tuesdays and Fridays Recheck in 6 weeks

## 2017-06-04 DIAGNOSIS — H43813 Vitreous degeneration, bilateral: Secondary | ICD-10-CM | POA: Diagnosis not present

## 2017-06-04 DIAGNOSIS — H353112 Nonexudative age-related macular degeneration, right eye, intermediate dry stage: Secondary | ICD-10-CM | POA: Diagnosis not present

## 2017-06-04 DIAGNOSIS — H35371 Puckering of macula, right eye: Secondary | ICD-10-CM | POA: Diagnosis not present

## 2017-06-04 DIAGNOSIS — H353221 Exudative age-related macular degeneration, left eye, with active choroidal neovascularization: Secondary | ICD-10-CM | POA: Diagnosis not present

## 2017-06-11 ENCOUNTER — Ambulatory Visit (HOSPITAL_COMMUNITY)
Admission: RE | Admit: 2017-06-11 | Discharge: 2017-06-11 | Disposition: A | Discharge status: Home / Self Care | Payer: Medicare Other | Source: Ambulatory Visit | Attending: Internal Medicine | Admitting: Internal Medicine

## 2017-06-11 DIAGNOSIS — C3490 Malignant neoplasm of unspecified part of unspecified bronchus or lung: Secondary | ICD-10-CM | POA: Insufficient documentation

## 2017-06-11 MED ORDER — IOPAMIDOL (ISOVUE-300) INJECTION 61%
75.0000 mL | Freq: Once | INTRAVENOUS | Status: AC | PRN
  Administered 2017-06-11: 75 mL via INTRAVENOUS

## 2017-06-15 DIAGNOSIS — Z681 Body mass index (BMI) 19 or less, adult: Secondary | ICD-10-CM | POA: Diagnosis not present

## 2017-06-15 DIAGNOSIS — H10023 Other mucopurulent conjunctivitis, bilateral: Secondary | ICD-10-CM | POA: Diagnosis not present

## 2017-06-18 ENCOUNTER — Other Ambulatory Visit: Payer: Self-pay | Admitting: Medical Oncology

## 2017-06-18 ENCOUNTER — Inpatient Hospital Stay (HOSPITAL_BASED_OUTPATIENT_CLINIC_OR_DEPARTMENT_OTHER): Payer: Medicare Other | Admitting: Oncology

## 2017-06-18 ENCOUNTER — Inpatient Hospital Stay: Payer: Medicare Other | Attending: Internal Medicine

## 2017-06-18 ENCOUNTER — Encounter: Payer: Self-pay | Admitting: Oncology

## 2017-06-18 ENCOUNTER — Encounter: Payer: Self-pay | Admitting: Medical Oncology

## 2017-06-18 ENCOUNTER — Other Ambulatory Visit: Payer: Self-pay | Admitting: Oncology

## 2017-06-18 ENCOUNTER — Telehealth: Payer: Self-pay | Admitting: Oncology

## 2017-06-18 VITALS — BP 127/58 | HR 85 | Temp 97.8°F | Resp 17 | Ht 63.0 in | Wt 113.1 lb

## 2017-06-18 DIAGNOSIS — Z7901 Long term (current) use of anticoagulants: Secondary | ICD-10-CM | POA: Diagnosis not present

## 2017-06-18 DIAGNOSIS — C3411 Malignant neoplasm of upper lobe, right bronchus or lung: Secondary | ICD-10-CM

## 2017-06-18 DIAGNOSIS — Z5181 Encounter for therapeutic drug level monitoring: Secondary | ICD-10-CM

## 2017-06-18 DIAGNOSIS — Z86010 Personal history of colonic polyps: Secondary | ICD-10-CM | POA: Insufficient documentation

## 2017-06-18 DIAGNOSIS — I4891 Unspecified atrial fibrillation: Secondary | ICD-10-CM | POA: Diagnosis not present

## 2017-06-18 DIAGNOSIS — Z79899 Other long term (current) drug therapy: Secondary | ICD-10-CM | POA: Insufficient documentation

## 2017-06-18 DIAGNOSIS — C3490 Malignant neoplasm of unspecified part of unspecified bronchus or lung: Secondary | ICD-10-CM

## 2017-06-18 LAB — CBC WITH DIFFERENTIAL/PLATELET
Basophils Absolute: 0 10*3/uL (ref 0.0–0.1)
Basophils Relative: 1 %
EOS PCT: 5 %
Eosinophils Absolute: 0.2 10*3/uL (ref 0.0–0.5)
HEMATOCRIT: 41.9 % (ref 34.8–46.6)
HEMOGLOBIN: 13.6 g/dL (ref 11.6–15.9)
LYMPHS ABS: 1.2 10*3/uL (ref 0.9–3.3)
LYMPHS PCT: 29 %
MCH: 30.2 pg (ref 25.1–34.0)
MCHC: 32.5 g/dL (ref 31.5–36.0)
MCV: 93.1 fL (ref 79.5–101.0)
Monocytes Absolute: 0.5 10*3/uL (ref 0.1–0.9)
Monocytes Relative: 13 %
Neutro Abs: 2.2 10*3/uL (ref 1.5–6.5)
Neutrophils Relative %: 52 %
PLATELETS: 260 10*3/uL (ref 145–400)
RBC: 4.5 MIL/uL (ref 3.70–5.45)
RDW: 13.7 % (ref 11.2–14.5)
WBC: 4.2 10*3/uL (ref 3.9–10.3)

## 2017-06-18 LAB — COMPREHENSIVE METABOLIC PANEL
ALBUMIN: 3.3 g/dL — AB (ref 3.5–5.0)
ALT: 11 U/L (ref 0–55)
AST: 23 U/L (ref 5–34)
Alkaline Phosphatase: 84 U/L (ref 40–150)
Anion gap: 6 (ref 3–11)
BUN: 14 mg/dL (ref 7–26)
CHLORIDE: 104 mmol/L (ref 98–109)
CO2: 30 mmol/L — ABNORMAL HIGH (ref 22–29)
Calcium: 8.9 mg/dL (ref 8.4–10.4)
Creatinine, Ser: 0.76 mg/dL (ref 0.60–1.10)
GFR calc Af Amer: >60 mL/min (ref >60)
Glucose, Bld: 129 mg/dL (ref 70–140)
POTASSIUM: 4.3 mmol/L (ref 3.5–5.1)
Sodium: 140 mmol/L (ref 136–145)
Total Bilirubin: 0.5 mg/dL (ref 0.2–1.2)
Total Protein: 6.7 g/dL (ref 6.4–8.3)

## 2017-06-18 MED ORDER — GEFITINIB 250 MG PO TABS
250.0000 mg | ORAL_TABLET | Freq: Every day | ORAL | 5 refills | Status: DC
Start: 2017-06-18 — End: 2017-06-20

## 2017-06-18 NOTE — Progress Notes (Signed)
[facility] OFFICE PROGRESS NOTE  Natalie Bis, MD [place] [address]  DIAGNOSIS: Recurrent non-small cell lung cancer presented with bilateral lung nodules in October 2016 initially diagnosed as a stage IA adenocarcinoma with positive EGFR mutation with deletion in exon 28 in December 2012.  PRIOR THERAPY: Status post bronchoscopy with right VATS and right upper lobectomy with lymph node dissection under the care of Dr. Servando Snare on 05/31/2011.  CURRENT THERAPY: Iressa 250 mg by mouth daily started 04/17/2015, status post 24 months of treatment.  INTERVAL HISTORY: Natalie Carlson 84 y.o. female returns for routine follow-up visit accompanied by her friend.  The patient is feeling fine today and has no specific complaints except for drainage to her eyes.  She was put on antibiotic eyedrops by her primary care provider is due to follow-up with her eye doctor tomorrow.  Eyedrops are helping some.  She denies fevers and chills.  Denies chest pain, shortness breath, cough, hemoptysis.  Denies nausea, vomiting, constipation, diarrhea.  The patient continues to tolerate her treatment with Iressa fairly well.  The patient is here for evaluation and repeat blood work and to review her restaging CT scan results.  MEDICAL HISTORY: Past Medical History:  Diagnosis Date  . Arthritis   . Atrial fibrillation ([place])   . Cataract of both eyes   . Chronic back pain   . Chronic diarrhea   . Colitis   . Hemorrhoids   . History of colon polyps   . History of migraines   . Hyperlipidemia   . Hypothyroidism   . Macular degeneration, dry   . Malignant neoplasm of right upper lobe of lung (HCC)    Stage 1A  EGFR positive  Deletion in exon 19.  ALK-negative  Right Upper Lobe Lung Mass  SUV 2.9 on PET Status post VATS and right upper lobe lobectomy 05/31/2011  . Mucus in stool   . Nephrolithiasis    1994 - passed on its on    ALLERGIES:  is allergic to doxycycline; entocort ec  [budesonide]; neurontin [gabapentin]; vicodin [hydrocodone-acetaminophen]; adhesive [tape]; amoxicillin; chocolate; codeine; levaquin [levofloxacin in d5w]; morphine and related; naprosyn [naproxen]; penicillins; prednisone; tramadol; and uncoded nonscreenable allergen.  MEDICATIONS:  Current Outpatient Medications  Medication Sig Dispense Refill  . APRISO 0.375 g 24 hr capsule TAKE 2 CAPSULE BY MOUTH TWICE DAILY 120 capsule 11  . Calcium Carbonate (CALCIUM 600 PO) Take 1 tablet by mouth 2 (two) times daily.    Marland Kitchen CARTIA XT 180 MG 24 hr capsule TAKE ONE CAPSULE BY MOUTH EVERY DAY 30 capsule 6  . Cholecalciferol 2000 UNITS CAPS Take 2,000 Units by mouth daily.     . furosemide (LASIX) 20 MG tablet TAKE 1 TABLET BY MOUTH EVERY DAY AS NEEDED FOR FLUID 30 tablet 5  . gefitinib (IRESSA) 250 MG tablet Take 1 tablet (250 mg total) by mouth daily. 30 tablet 5  . gentamicin (GARAMYCIN) 0.3 % ophthalmic solution Place 1 drop into both eyes 4 (four) times daily.  0  . LUTEIN PO Take 1 capsule by mouth daily. Reported on 10/27/2015    . magnesium 30 MG tablet Take 30 mg by mouth every morning.    . Multiple Vitamins-Minerals (AIRBORNE) CHEW Chew 1 tablet by mouth daily.    . Multiple Vitamins-Minerals (HAIR/SKIN/NAILS) TABS Take 1 tablet by mouth daily.    . NON FORMULARY Eye In jection every 5-6 weeks.    . NON FORMULARY Place 1 drop  into both eyes. Thera Tears    . Omega-3 Fatty Acids (FISH OIL) 1200 MG CAPS Take 1,200 mg by mouth daily.    . potassium chloride (K-DUR,KLOR-CON) 10 MEQ tablet Take 1 tablet (10 mEq total) by mouth daily. 30 tablet 6  . warfarin (COUMADIN) 5 MG tablet TAKE 1 TABLET BY MOUTH DAILY EXCEPT 1 AND 1/2 TABLETS ON TUESDAYS AND FRIDAYS. 40 tablet 6  . diclofenac sodium (VOLTAREN) 1 % GEL Apply 1 application topically 2 (two) times daily as needed (for neck pain).     Marland Kitchen HYDROmorphone (DILAUDID) 4 MG tablet Take 1 tablet (4 mg total) by mouth every 6 (six) hours as needed for severe  pain. (Patient not taking: Reported on 05/14/2017) 20 tablet 0  . ondansetron (ZOFRAN ODT) 4 MG disintegrating tablet Take 1 tablet (4 mg total) by mouth every 8 (eight) hours as needed. (Patient not taking: Reported on 06/18/2017) 10 tablet 1   No current facility-administered medications for this visit.     SURGICAL HISTORY:  Past Surgical History:  Procedure Laterality Date  . Bladder tack  1996  . CARDIOVERSION  11/02/09   x 2  . CATARACT EXTRACTION  2010/2011  . COLONOSCOPY  3 YRS AGO  . COLONOSCOPY  03/06/2011   Procedure: COLONOSCOPY;  Surgeon: Rogene Houston, MD;  [facility];  Service: Endoscopy;  Laterality: N/A;  7:30 am  . ESOPHAGOGASTRODUODENOSCOPY    . FLEXIBLE SIGMOIDOSCOPY N/A 10/04/2012   Procedure: FLEXIBLE SIGMOIDOSCOPY;  Surgeon: Rogene Houston, MD;  [facility];  Service: Endoscopy;  Laterality: N/A;  11:00-moved to [address] to notify pt  . THYROID SURGERY  1961  . TONSILLECTOMY    . VIDEO BRONCHOSCOPY  05/31/2011   Procedure: VIDEO BRONCHOSCOPY;  Surgeon: Grace Isaac, MD;  Location: [facility] OR;  Service: Thoracic;  Laterality: N/A;    REVIEW OF SYSTEMS:   Review of Systems  Constitutional: Negative for appetite change, chills, fatigue, fever and unexpected weight change.  HENT:   Negative for mouth sores, nosebleeds, sore throat and trouble swallowing.   Eyes: Negative for icterus. Positive for eye drainage. Respiratory: Negative for cough, hemoptysis, shortness of breath and wheezing.   Cardiovascular: Negative for chest pain and leg swelling.  Gastrointestinal: Negative for abdominal pain, constipation, diarrhea, nausea and vomiting.  Genitourinary: Negative for bladder incontinence, difficulty urinating, dysuria, frequency and hematuria.   Musculoskeletal: Negative for back pain, gait problem, neck pain and neck stiffness.  Skin: Negative for itching and rash.  Neurological: Negative for dizziness, extremity weakness, gait problem,  headaches, light-headedness and seizures.  Hematological: Negative for adenopathy. Does not bruise/bleed easily.  Psychiatric/Behavioral: Negative for confusion, depression and sleep disturbance. The patient is not nervous/anxious.     PHYSICAL EXAMINATION:  Blood pressure (!) 127/58, pulse 85, temperature 97.8 F (36.6 C), temperature source Oral, resp. rate 17, height _0  (1.6 m), weight 113 lb 1.6 oz (51.3 kg), SpO2 98 %.  ECOG PERFORMANCE STATUS: 1 - Symptomatic but completely ambulatory  Physical Exam  Constitutional: Oriented to person, place, and time and well-developed, well-nourished, and in no distress. No distress.  HENT:  Head: Normocephalic and atraumatic.  Mouth/Throat: Oropharynx is clear and moist. No oropharyngeal exudate.  Eyes: Conjunctivae are normal. Right eye exhibits no discharge. Left eye exhibits no discharge. No scleral icterus.  Neck: Normal range of motion. Neck supple.  Cardiovascular: Normal rate, regular rhythm, normal heart sounds and intact distal pulses.   Pulmonary/Chest: Effort normal and breath  sounds normal. No respiratory distress. No wheezes. No rales.  Abdominal: Soft. Bowel sounds are normal. Exhibits no distension and no mass. There is no tenderness.  Musculoskeletal: Normal range of motion. Exhibits no edema.  Lymphadenopathy:    No cervical adenopathy.  Neurological: Alert and oriented to person, place, and time. Exhibits normal muscle tone. Gait normal. Coordination normal.  Skin: Skin is warm and dry. No rash noted. Not diaphoretic. No erythema. No pallor.  Psychiatric: Mood, memory and judgment normal.  Vitals reviewed.  LABORATORY DATA: Lab Results  Component Value Date   WBC 4.2 06/18/2017   HGB 13.6 06/18/2017   HCT 41.9 06/18/2017   MCV 93.1 06/18/2017   PLT 260 06/18/2017      Chemistry      Component Value Date/Time   NA 140 06/18/2017 1301   NA 141 05/14/2017 1304   K 4.3 06/18/2017 1301   K 4.3 05/14/2017 1304    CL 104 06/18/2017 1301   CO2 30 (H) 06/18/2017 1301   CO2 30 (H) 05/14/2017 1304   BUN 14 06/18/2017 1301   BUN 19.0 05/14/2017 1304   CREATININE 0.76 06/18/2017 1301   CREATININE 0.8 05/14/2017 1304      Component Value Date/Time   CALCIUM 8.9 06/18/2017 1301   CALCIUM 9.1 05/14/2017 1304   ALKPHOS 84 06/18/2017 1301   ALKPHOS 86 05/14/2017 1304   AST 23 06/18/2017 1301   AST 25 05/14/2017 1304   ALT 11 06/18/2017 1301   ALT 15 05/14/2017 1304   BILITOT 0.5 06/18/2017 1301   BILITOT 0.57 05/14/2017 1304       RADIOGRAPHIC STUDIES:  Ct Chest W Contrast  Result Date: 06/11/2017 CLINICAL DATA:  Restaging non-small cell lung ca surg/chemo pt is currently on immunotherapy treatments; no chest complaints today; chronic sob^66m ISOVUE-300 IOPAMIDOL (ISOVUE-300) INJECTION 61% EXAM: CT CHEST WITH CONTRAST TECHNIQUE: Multidetector CT imaging of the chest was performed during intravenous contrast administration. CONTRAST:  741mISOVUE-300 IOPAMIDOL (ISOVUE-300) INJECTION 61% COMPARISON:  Chest CT 03/06/2017 FINDINGS: Cardiovascular: Ascending aorta measures 35 mm. Mediastinum/Nodes: No axillary supraclavicular adenopathy. The RIGHT lobe of thyroid gland is enlarged but not changed from comparison exams. Gland with uniformly metabolic on comparison PET-CT scan consistent with thyroiditis. No mediastinal adenopathy. No hilar adenopathy. No pericardial effusion. Esophagus normal. Lungs/Pleura: Mild nodular thickening along the oblique fissure in the RIGHT lung. No change from prior. Confluence of vessels in the LEFT upper lobe is unchanged (image 19, series 4). Peripheral nodule in the LEFT upper lobe measuring 4 mm (image 56, series 4 ) is unchanged No new pulmonary nodularity Upper Abdomen: Limited view of the liver, kidneys, pancreas are unremarkable. Normal adrenal glands. Musculoskeletal: No aggressive osseous lesion. IMPRESSION: 1. No evidence lung cancer recurrence 2. Stable mild nodular  thickening along the RIGHT oblique fissure is Electronically Signed   By: StSuzy Bouchard.D.   On: 06/11/2017 15:11     ASSESSMENT/PLAN:  [facility] right [facility] [place] very [place] [age][facility] old white female with recurrent non-small cell lung cancer, adenocarcinoma with positive EGFR mutation. The patient was started on treatment with Iressa 250 mg by mouth daily status post 24 months of treatment. The patient continues to tolerate this treatment fairly well with no concerning complaints.  The patient was seen with Dr. MoJulien Nordmann Restaging CT scan results were discussed with the patient and her friend which showed no evidence of disease progression.  Recommend that she continue her treatment  with Iressa. The patient will return in 1 month for evaluation and repeat labs.   She was advised to call immediately if she has any concerning symptoms in the interval. The patient voices understanding of current disease status and treatment options and is in agreement with the current care plan. All questions were answered. The patient knows to call the clinic with any problems, questions or concerns. We can certainly see the patient much sooner if necessary.  Orders Placed This Encounter  Procedures  . CBC with Differential (Cancer Center Only)    Standing Status:   Future    Standing Expiration Date:   06/18/2018  . CMP ([facility] only)    Standing Status:   Future    Standing Expiration Date:   06/18/2018    Mikey Bussing, DNP, AGPCNP-BC, AOCNP 06/18/17   ADDENDUM: Hematology/Oncology Attending: I had a face-to-face encounter with the patient today.  I recommended her care plan.  She is a very pleasant 84 years old white female with recurrent non-small cell lung cancer, adenocarcinoma with positive EGFR mutation.  She is currently undergoing treatment with Iressa 250 mg p.o. daily status post 24 months of treatment.  She has been tolerating this treatment  fairly well with no concerning complaints. The patient had repeat CT scan of the chest performed recently. I personally and independently reviewed the scans and discussed the results with the patient and her friend today.  Her scan showed no evidence for disease progression. I recommended for the patient to continue her current treatment with Iressa. I would see her back for follow-up visit in 1 month for reevaluation with repeat blood work. She was advised to call immediately if she has any concerning symptoms in the interval.  Disclaimer: This note was dictated with voice recognition software. Similar sounding words can inadvertently be transcribed and may be missed upon review. Eilleen Kempf, MD 06/18/17

## 2017-06-18 NOTE — Progress Notes (Signed)
Refills faxed to [facility] [place]

## 2017-06-18 NOTE — Assessment & Plan Note (Signed)
This is a very pleasant 84 year old white female with recurrent non-small cell lung cancer, adenocarcinoma with positive EGFR mutation. The patient was started on treatment with Iressa 250 mg by mouth daily status post 24 months of treatment. The patient continues to tolerate this treatment fairly well with no concerning complaints.  The patient was seen with Dr. Julien Nordmann.  Restaging CT scan results were discussed with the patient and her friend which showed no evidence of disease progression.  Recommend that she continue her treatment with Iressa. The patient will return in 1 month for evaluation and repeat labs.   She was advised to call immediately if she has any concerning symptoms in the interval. The patient voices understanding of current disease status and treatment options and is in agreement with the current care plan. All questions were answered. The patient knows to call the clinic with any problems, questions or concerns. We can certainly see the patient much sooner if necessary.

## 2017-06-18 NOTE — Telephone Encounter (Signed)
Scheduled appt per 2/4 los - Gave patient AVS and calender per los.

## 2017-06-19 DIAGNOSIS — H16223 Keratoconjunctivitis sicca, not specified as Sjogren's, bilateral: Secondary | ICD-10-CM | POA: Diagnosis not present

## 2017-06-19 DIAGNOSIS — H10503 Unspecified blepharoconjunctivitis, bilateral: Secondary | ICD-10-CM | POA: Diagnosis not present

## 2017-06-19 DIAGNOSIS — Z961 Presence of intraocular lens: Secondary | ICD-10-CM | POA: Diagnosis not present

## 2017-06-20 ENCOUNTER — Other Ambulatory Visit: Payer: Self-pay | Admitting: Medical Oncology

## 2017-06-20 DIAGNOSIS — C3490 Malignant neoplasm of unspecified part of unspecified bronchus or lung: Secondary | ICD-10-CM

## 2017-06-20 MED ORDER — GEFITINIB 250 MG PO TABS: 250.0000 mg | ORAL_TABLET | Freq: Every day | ORAL | 5 refills | Status: DC

## 2017-06-20 NOTE — Progress Notes (Signed)
Faxed new rx to [facility] and me.

## 2017-07-13 ENCOUNTER — Ambulatory Visit (INDEPENDENT_AMBULATORY_CARE_PROVIDER_SITE_OTHER): Payer: Medicare Other | Admitting: *Deleted

## 2017-07-13 DIAGNOSIS — Z5181 Encounter for therapeutic drug level monitoring: Secondary | ICD-10-CM

## 2017-07-13 DIAGNOSIS — I4891 Unspecified atrial fibrillation: Secondary | ICD-10-CM | POA: Diagnosis not present

## 2017-07-13 LAB — POCT INR: INR: 3.4

## 2017-07-13 NOTE — Patient Instructions (Signed)
Take coumadin 1/2 tablet tonight then decrease dose to 1 tablet daily except 1 1/2 tablets on Tuesdays  Recheck in 3 weeks

## 2017-07-16 ENCOUNTER — Encounter: Payer: Self-pay | Admitting: Oncology

## 2017-07-16 ENCOUNTER — Inpatient Hospital Stay: Payer: Medicare Other

## 2017-07-16 ENCOUNTER — Inpatient Hospital Stay: Payer: Medicare Other | Attending: Internal Medicine | Admitting: Oncology

## 2017-07-16 VITALS — BP 116/73 | HR 70 | Temp 97.6°F | Resp 20 | Ht 63.0 in | Wt 113.8 lb

## 2017-07-16 DIAGNOSIS — Z86010 Personal history of colonic polyps: Secondary | ICD-10-CM | POA: Diagnosis not present

## 2017-07-16 DIAGNOSIS — Z7901 Long term (current) use of anticoagulants: Secondary | ICD-10-CM | POA: Diagnosis not present

## 2017-07-16 DIAGNOSIS — Z5181 Encounter for therapeutic drug level monitoring: Secondary | ICD-10-CM

## 2017-07-16 DIAGNOSIS — I4891 Unspecified atrial fibrillation: Secondary | ICD-10-CM | POA: Diagnosis not present

## 2017-07-16 DIAGNOSIS — Z79899 Other long term (current) drug therapy: Secondary | ICD-10-CM | POA: Diagnosis not present

## 2017-07-16 DIAGNOSIS — C3411 Malignant neoplasm of upper lobe, right bronchus or lung: Secondary | ICD-10-CM | POA: Insufficient documentation

## 2017-07-16 LAB — CBC WITH DIFFERENTIAL (CANCER CENTER ONLY)
BASOS PCT: 1 %
Basophils Absolute: 0 10*3/uL (ref 0.0–0.1)
EOS ABS: 0.4 10*3/uL (ref 0.0–0.5)
Eosinophils Relative: 7 %
HEMATOCRIT: 44.4 % (ref 34.8–46.6)
HEMOGLOBIN: 14.6 g/dL (ref 11.6–15.9)
Lymphocytes Relative: 28 %
Lymphs Abs: 1.5 10*3/uL (ref 0.9–3.3)
MCH: 30.6 pg (ref 25.1–34.0)
MCHC: 32.9 g/dL (ref 31.5–36.0)
MCV: 93.1 fL (ref 79.5–101.0)
Monocytes Absolute: 0.6 10*3/uL (ref 0.1–0.9)
Monocytes Relative: 12 %
NEUTROS ABS: 2.7 10*3/uL (ref 1.5–6.5)
NEUTROS PCT: 52 %
Platelet Count: 279 10*3/uL (ref 145–400)
RBC: 4.77 MIL/uL (ref 3.70–5.45)
RDW: 13.8 % (ref 11.2–14.5)
WBC: 5.3 10*3/uL (ref 3.9–10.3)

## 2017-07-16 LAB — CMP (CANCER CENTER ONLY)
ALK PHOS: 91 U/L (ref 40–150)
ALT: 17 U/L (ref 0–55)
AST: 30 U/L (ref 5–34)
Albumin: 3.9 g/dL (ref 3.5–5.0)
Anion gap: 10 (ref 3–11)
BILIRUBIN TOTAL: 0.8 mg/dL (ref 0.2–1.2)
BUN: 17 mg/dL (ref 7–26)
CALCIUM: 9.6 mg/dL (ref 8.4–10.4)
CO2: 28 mmol/L (ref 22–29)
CREATININE: 0.79 mg/dL (ref 0.60–1.10)
Chloride: 103 mmol/L (ref 98–109)
GFR, Est AFR Am: >60 mL/min (ref >60)
GFR, Estimated: >60 mL/min (ref >60)
GLUCOSE: 86 mg/dL (ref 70–140)
Potassium: 3.7 mmol/L (ref 3.5–5.1)
SODIUM: 141 mmol/L (ref 136–145)
TOTAL PROTEIN: 7.6 g/dL (ref 6.4–8.3)

## 2017-07-16 LAB — RESEARCH LABS

## 2017-07-16 NOTE — Assessment & Plan Note (Signed)
This is a very pleasant 84 year old white female with recurrent non-small cell lung cancer, adenocarcinoma with positive EGFR mutation. The patient was started on treatment with Iressa 250 mg by mouth daily status post 29month of treatment. The patient continues to tolerate this treatment fairly well with no concerning complaints. Recommend that she continue her treatment with Iressa. The patient will return in 1 month for evaluation and repeat labs.   She was advised to call immediately if she has any concerning symptoms in the interval. The patient voices understanding of current disease status and treatment options and is in agreement with the current care plan. All questions were answered. The patient knows to call the clinic with any problems, questions or concerns. We can certainly see the patient much sooner if necessary.

## 2017-07-16 NOTE — Progress Notes (Signed)
[facility] OFFICE PROGRESS NOTE  [name], MD [place] [address]  DIAGNOSIS: Recurrent non-small cell lung cancer presented with bilateral lung nodules in October 2016 initially diagnosed as a stage IA adenocarcinoma with positive EGFR mutation with deletion in exon 46 in December 2012.  PRIOR THERAPY: Status post bronchoscopy with right VATS and right upper lobectomy with lymph node dissection under the care of Dr. Servando Snare on 05/31/2011.  CURRENT THERAPY: Iressa 250 mg by mouth daily started 04/17/2015, status post 71month of treatment.  INTERVAL HISTORY: Natalie CHATMON84y.o. female returns for routine follow-up visit accompanied by her friend.  The patient is feeling fine today and has no specific complaints.  Patient denies fevers and chills.  Denies chest pain, shortness breath, cough, hemoptysis.  Denies nausea, vomiting, constipation, diarrhea.  The patient continues to tolerate her treatment with Iressa fairly well.  The patient is here for evaluation repeat lab work.  MEDICAL HISTORY: Past Medical History:  Diagnosis Date  . Arthritis   . [place]rial [place]   . Cataract of both eyes   . Chronic back pain   . Chronic diarrhea   . Colitis   . Hemorrhoids   . History of colon polyps   . History of migraines   . Hyperlipidemia   . Hypothyroidism   . Macular degeneration, dry   . Malignant neoplasm of right upper lobe of lung (HCC)    Stage 1A  EGFR positive  Deletion in exon 19.  ALK-negative  Right Upper Lobe Lung Mass  SUV 2.9 on PET Status post VATS and right upper lobe lobectomy 05/31/2011  . Mucus in stool   . Nephrolithiasis    1994 - passed on its on    ALLERGIES:  is allergic to doxycycline; entocort ec [budesonide]; neurontin [gabapentin]; vicodin [hydrocodone-acetaminophen]; adhesive [tape]; amoxicillin; chocolate; codeine; levaquin [levofloxacin in d5w]; morphine and related; naprosyn [naproxen]; penicillins; prednisone;  tramadol; and uncoded nonscreenable allergen.  MEDICATIONS:  Current Outpatient Medications  Medication Sig Dispense Refill  . APRISO 0.375 g 24 hr capsule TAKE 2 CAPSULE BY MOUTH TWICE DAILY 120 capsule 11  . Calcium Carbonate (CALCIUM 600 PO) Take 1 tablet by mouth 2 (two) times daily.    .Marland KitchenCARTIA XT 180 MG 24 hr capsule TAKE ONE CAPSULE BY MOUTH EVERY DAY 30 capsule 6  . Cholecalciferol 2000 UNITS CAPS Take 2,000 Units by mouth daily.     . diclofenac sodium (VOLTAREN) 1 % GEL Apply 1 application topically 2 (two) times daily as needed (for neck pain).     . furosemide (LASIX) 20 MG tablet TAKE 1 TABLET BY MOUTH EVERY DAY AS NEEDED FOR FLUID 30 tablet 5  . gefitinib (IRESSA) 250 MG tablet Take 1 tablet (250 mg total) by mouth daily. 30 tablet 5  . HYDROmorphone (DILAUDID) 4 MG tablet Take 1 tablet (4 mg total) by mouth every 6 (six) hours as needed for severe pain. (Patient not taking: Reported on 05/14/2017) 20 tablet 0  . LUTEIN PO Take 1 capsule by mouth daily. Reported on 10/27/2015    . magnesium 30 MG tablet Take 30 mg by mouth every morning.    . Multiple Vitamins-Minerals (AIRBORNE) CHEW Chew 1 tablet by mouth daily.    . Multiple Vitamins-Minerals (HAIR/SKIN/NAILS) TABS Take 1 tablet by mouth daily.    . NON FORMULARY Eye In jection every 5-6 weeks.    . NON FORMULARY Place 1 drop into both eyes. Thera Tears    .  Omega-3 Fatty Acids (FISH OIL) 1200 MG CAPS Take 1,200 mg by mouth daily.    . ondansetron (ZOFRAN ODT) 4 MG disintegrating tablet Take 1 tablet (4 mg total) by mouth every 8 (eight) hours as needed. (Patient not taking: Reported on 06/18/2017) 10 tablet 1  . potassium chloride (K-DUR,KLOR-CON) 10 MEQ tablet Take 1 tablet (10 mEq total) by mouth daily. 30 tablet 6  . warfarin (COUMADIN) 5 MG tablet TAKE 1 TABLET BY MOUTH DAILY EXCEPT 1 AND 1/2 TABLETS ON TUESDAYS AND FRIDAYS. 40 tablet 6   No current facility-administered medications for this visit.     SURGICAL HISTORY:   Past Surgical History:  Procedure Laterality Date  . Bladder tack  1996  . CARDIOVERSION  11/02/09   x 2  . CATARACT EXTRACTION  2010/2011  . COLONOSCOPY  3 YRS AGO  . COLONOSCOPY  03/06/2011   Procedure: COLONOSCOPY;  Surgeon: Rogene Houston, MD;  [facility];  Service: Endoscopy;  Laterality: N/A;  7:30 am  . ESOPHAGOGASTRODUODENOSCOPY    . FLEXIBLE SIGMOIDOSCOPY N/A 10/04/2012   Procedure: FLEXIBLE SIGMOIDOSCOPY;  Surgeon: Rogene Houston, MD;  Location: [facility];  Service: Endoscopy;  Laterality: N/A;  11:00-moved to 1015 Ann to notify pt  . THYROID SURGERY  1961  . TONSILLECTOMY    . VIDEO BRONCHOSCOPY  05/31/2011   Procedure: VIDEO BRONCHOSCOPY;  Surgeon: Grace Isaac, MD;  Location: [facility] OR;  Service: Thoracic;  Laterality: N/A;    REVIEW OF SYSTEMS:   Review of Systems  Constitutional: Negative for appetite change, chills, fatigue, fever and unexpected weight change.  HENT:   Negative for mouth sores, nosebleeds, sore throat and trouble swallowing.   Eyes: Negative for eye problems and icterus.  Respiratory: Negative for cough, hemoptysis, shortness of breath and wheezing.   Cardiovascular: Negative for chest pain and leg swelling.  Gastrointestinal: Negative for abdominal pain, constipation, diarrhea, nausea and vomiting.  Genitourinary: Negative for bladder incontinence, difficulty urinating, dysuria, frequency and hematuria.   Musculoskeletal: Negative for back pain, gait problem, neck pain and neck stiffness.  Skin: Negative for itching and rash.  Neurological: Negative for dizziness, extremity weakness, gait problem, headaches, light-headedness and seizures.  Hematological: Negative for adenopathy. Does not bruise/bleed easily.  Psychiatric/Behavioral: Negative for confusion, depression and sleep disturbance. The patient is not nervous/anxious.     PHYSICAL EXAMINATION:  Blood pressure 116/73, pulse 70, temperature 97.6 F (36.4 C), temperature  source Oral, resp. rate 20, height 5' 3"  (1.6 m), weight 113 lb 12.8 oz (51.6 kg), SpO2 100 %.  ECOG PERFORMANCE STATUS: 1 - Symptomatic but completely ambulatory  Physical Exam  Constitutional: Oriented to person, place, and time and well-developed, well-nourished, and in no distress. No distress.  HENT:  Head: Normocephalic and atraumatic.  Mouth/Throat: Oropharynx is clear and moist. No oropharyngeal exudate.  Eyes: Conjunctivae are normal. Right eye exhibits no discharge. Left eye exhibits no discharge. No scleral icterus.  Neck: Normal range of motion. Neck supple.  Cardiovascular: Normal rate, regular rhythm, normal heart sounds and intact distal pulses.   Pulmonary/Chest: Effort normal and breath sounds normal. No respiratory distress. No wheezes. No rales.  Abdominal: Soft. Bowel sounds are normal. Exhibits no distension and no mass. There is no tenderness.  Musculoskeletal: Normal range of motion. Exhibits no edema.  Lymphadenopathy:    No cervical adenopathy.  Neurological: Alert and oriented to person, place, and time. Exhibits normal muscle tone. Gait normal. Coordination normal.  Skin: Skin is warm and  dry. No rash noted. Not diaphoretic. No erythema. No pallor.  Psychiatric: Mood, memory and judgment normal.  Vitals reviewed.  LABORATORY DATA: Lab Results  Component Value Date   WBC 5.3 07/16/2017   HGB 13.6 06/18/2017   HCT 44.4 07/16/2017   MCV 93.1 07/16/2017   PLT 279 07/16/2017      Chemistry      Component Value Date/Time   NA 141 07/16/2017 1226   NA 141 05/14/2017 1304   K 3.7 07/16/2017 1226   K 4.3 05/14/2017 1304   CL 103 07/16/2017 1226   CO2 28 07/16/2017 1226   CO2 30 (H) 05/14/2017 1304   BUN 17 07/16/2017 1226   BUN 19.0 05/14/2017 1304   CREATININE 0.79 07/16/2017 1226   CREATININE 0.8 05/14/2017 1304      Component Value Date/Time   CALCIUM 9.6 07/16/2017 1226   CALCIUM 9.1 05/14/2017 1304   ALKPHOS 91 07/16/2017 1226   ALKPHOS 86  05/14/2017 1304   AST 30 07/16/2017 1226   AST 25 05/14/2017 1304   ALT 17 07/16/2017 1226   ALT 15 05/14/2017 1304   BILITOT 0.8 07/16/2017 1226   BILITOT 0.57 05/14/2017 1304       RADIOGRAPHIC STUDIES:  No results found.   ASSESSMENT/PLAN:  Malignant neoplasm of right upper lobe of lung [facility]) This is a very pleasant 84 year old white female with recurrent non-small cell lung cancer, adenocarcinoma with positive EGFR mutation. The patient was started on treatment with Iressa 250 mg by mouth daily status post 42month of treatment. The patient continues to tolerate this treatment fairly well with no concerning complaints. Recommend that she continue her treatment with Iressa. The patient will return in 1 month for evaluation and repeat labs.   She was advised to call immediately if she has any concerning symptoms in the interval. The patient voices understanding of current disease status and treatment options and is in agreement with the current care plan. All questions were answered. The patient knows to call the clinic with any problems, questions or concerns. We can certainly see the patient much sooner if necessary.  Orders Placed This Encounter  Procedures  . CBC with Differential (Cancer Center Only)    Standing Status:   Future    Standing Expiration Date:   07/17/2018  . C[place]    Standing Status:   Future    Standing Expiration Date:   07/17/2018  . CBC with Differential (Cancer Center Only)    Standing Status:   Future    Standing Expiration Date:   07/17/2018  . [place]    Standing Status:   Future    Standing Expiration Date:   07/17/2018   KMikey Bussing DNP, AGPCNP-BC, AOCNP 07/16/17

## 2017-07-23 DIAGNOSIS — H35371 Puckering of macula, right eye: Secondary | ICD-10-CM | POA: Diagnosis not present

## 2017-07-23 DIAGNOSIS — H43813 Vitreous degeneration, bilateral: Secondary | ICD-10-CM | POA: Diagnosis not present

## 2017-07-23 DIAGNOSIS — H353221 Exudative age-related macular degeneration, left eye, with active choroidal neovascularization: Secondary | ICD-10-CM | POA: Diagnosis not present

## 2017-07-23 DIAGNOSIS — H353112 Nonexudative age-related macular degeneration, right eye, intermediate dry stage: Secondary | ICD-10-CM | POA: Diagnosis not present

## 2017-08-07 ENCOUNTER — Ambulatory Visit (INDEPENDENT_AMBULATORY_CARE_PROVIDER_SITE_OTHER): Payer: Medicare Other | Admitting: *Deleted

## 2017-08-07 DIAGNOSIS — Z5181 Encounter for therapeutic drug level monitoring: Secondary | ICD-10-CM | POA: Diagnosis not present

## 2017-08-07 DIAGNOSIS — I4891 Unspecified atrial fibrillation: Secondary | ICD-10-CM | POA: Diagnosis not present

## 2017-08-07 LAB — POCT INR: INR: 1.9

## 2017-08-07 NOTE — Patient Instructions (Signed)
Increase coumadin to 1 tablet daily except 1 1/2 tablets on Tuesdays and Fridays Recheck in 3 weeks

## 2017-08-13 ENCOUNTER — Inpatient Hospital Stay: Payer: Medicare Other

## 2017-08-13 ENCOUNTER — Inpatient Hospital Stay: Payer: Medicare Other | Attending: Internal Medicine | Admitting: Internal Medicine

## 2017-08-13 ENCOUNTER — Encounter: Payer: Self-pay | Admitting: Internal Medicine

## 2017-08-13 ENCOUNTER — Telehealth: Payer: Self-pay

## 2017-08-13 DIAGNOSIS — Z86010 Personal history of colonic polyps: Secondary | ICD-10-CM | POA: Diagnosis not present

## 2017-08-13 DIAGNOSIS — C3411 Malignant neoplasm of upper lobe, right bronchus or lung: Secondary | ICD-10-CM | POA: Diagnosis not present

## 2017-08-13 DIAGNOSIS — Z5181 Encounter for therapeutic drug level monitoring: Secondary | ICD-10-CM

## 2017-08-13 DIAGNOSIS — I4891 Unspecified atrial fibrillation: Secondary | ICD-10-CM

## 2017-08-13 DIAGNOSIS — Z7901 Long term (current) use of anticoagulants: Secondary | ICD-10-CM | POA: Insufficient documentation

## 2017-08-13 DIAGNOSIS — Z79899 Other long term (current) drug therapy: Secondary | ICD-10-CM | POA: Insufficient documentation

## 2017-08-13 DIAGNOSIS — C3490 Malignant neoplasm of unspecified part of unspecified bronchus or lung: Secondary | ICD-10-CM

## 2017-08-13 LAB — CBC WITH DIFFERENTIAL (CANCER CENTER ONLY)
BASOS PCT: 1 %
Basophils Absolute: 0.1 10*3/uL (ref 0.0–0.1)
EOS ABS: 0.2 10*3/uL (ref 0.0–0.5)
Eosinophils Relative: 4 %
HEMATOCRIT: 43.2 % (ref 34.8–46.6)
HEMOGLOBIN: 14.2 g/dL (ref 11.6–15.9)
LYMPHS ABS: 1.2 10*3/uL (ref 0.9–3.3)
Lymphocytes Relative: 27 %
MCH: 30.4 pg (ref 25.1–34.0)
MCHC: 32.8 g/dL (ref 31.5–36.0)
MCV: 92.5 fL (ref 79.5–101.0)
Monocytes Absolute: 0.6 10*3/uL (ref 0.1–0.9)
Monocytes Relative: 13 %
NEUTROS ABS: 2.5 10*3/uL (ref 1.5–6.5)
NEUTROS PCT: 55 %
Platelet Count: 247 10*3/uL (ref 145–400)
RBC: 4.67 MIL/uL (ref 3.70–5.45)
RDW: 13.9 % (ref 11.2–14.5)
WBC: 4.5 10*3/uL (ref 3.9–10.3)

## 2017-08-13 LAB — CMP (CANCER CENTER ONLY)
ALT: 16 U/L (ref 0–55)
ANION GAP: 7 (ref 3–11)
AST: 27 U/L (ref 5–34)
Albumin: 3.8 g/dL (ref 3.5–5.0)
Alkaline Phosphatase: 92 U/L (ref 40–150)
BUN: 15 mg/dL (ref 7–26)
CO2: 30 mmol/L — ABNORMAL HIGH (ref 22–29)
CREATININE: 0.86 mg/dL (ref 0.60–1.10)
Calcium: 9.8 mg/dL (ref 8.4–10.4)
Chloride: 105 mmol/L (ref 98–109)
GFR, EST NON AFRICAN AMERICAN: 60 mL/min — AB (ref >60)
Glucose, Bld: 131 mg/dL (ref 70–140)
Potassium: 5.7 mmol/L — ABNORMAL HIGH (ref 3.5–5.1)
SODIUM: 142 mmol/L (ref 136–145)
Total Bilirubin: 0.5 mg/dL (ref 0.2–1.2)
Total Protein: 7.3 g/dL (ref 6.4–8.3)

## 2017-08-13 NOTE — Telephone Encounter (Signed)
Printed avs and calender of upcoming appointment. Appointment for May was already scheduled and patient will get CT at [facility] location gave her radiology #. Per 4/1 los

## 2017-08-13 NOTE — Progress Notes (Signed)
[facility] [phone]   [phone]  OFFICE PROGRESS NOTE  [name], MD [place] [address]  DIAGNOSIS: Recurrent non-small cell lung cancer presented with bilateral lung nodules in October 2016 initially diagnosed as a stage IA adenocarcinoma with positive EGFR mutation with deletion in exon 78 in December 2012.  PRIOR THERAPY: Status post bronchoscopy with right VATS and right upper lobectomy with lymph node dissection under the care of Dr. Servando Snare on 05/31/2011.  CURRENT THERAPY: Iressa 250 mg by mouth daily started 04/17/2015, status post 24 months of treatment.  INTERVAL HISTORY: Natalie Carlson 84 y.o. female returns to the clinic today for follow-up visit accompanied by her friend.  The patient is feeling fine today with no specific complaints.  She denied having any chest pain, shortness breath, cough or hemoptysis.  She denied having any weight loss or night sweats.  She has no nausea, vomiting, diarrhea or constipation.  She denied having any skin rash.  She continues to tolerate her treatment with Iressa fairly well.  The patient is here today for evaluation and repeat blood work.   MEDICAL HISTORY: Past Medical History:  Diagnosis Date  . Arthritis   . Atrial fibrillation ([place])   . Cataract of both eyes   . Chronic back pain   . Chronic diarrhea   . Colitis   . Hemorrhoids   . History of colon polyps   . History of migraines   . Hyperlipidemia   . Hypothyroidism   . Macular degeneration, dry   . Malignant neoplasm of right upper lobe of lung (HCC)    Stage 1A  EGFR positive  Deletion in exon 19.  ALK-negative  Right Upper Lobe Lung Mass  SUV 2.9 on PET Status post VATS and right upper lobe lobectomy 05/31/2011  . Mucus in stool   . Nephrolithiasis    1994 - passed on its on    ALLERGIES:  is allergic to doxycycline; entocort ec [budesonide]; neurontin [gabapentin]; vicodin [hydrocodone-acetaminophen];  adhesive [tape]; amoxicillin; chocolate; codeine; levaquin [levofloxacin in d5w]; morphine and related; naprosyn [naproxen]; penicillins; prednisone; tramadol; and uncoded nonscreenable allergen.  MEDICATIONS:  Current Outpatient Medications  Medication Sig Dispense Refill  . APRISO 0.375 g 24 hr capsule TAKE 2 CAPSULE BY MOUTH TWICE DAILY 120 capsule 11  . Calcium Carbonate (CALCIUM 600 PO) Take 1 tablet by mouth 2 (two) times daily.    Marland Kitchen CARTIA XT 180 MG 24 hr capsule TAKE ONE CAPSULE BY MOUTH EVERY DAY 30 capsule 6  . Cholecalciferol 2000 UNITS CAPS Take 2,000 Units by mouth daily.     . diclofenac sodium (VOLTAREN) 1 % GEL Apply 1 application topically 2 (two) times daily as needed (for neck pain).     . furosemide (LASIX) 20 MG tablet TAKE 1 TABLET BY MOUTH EVERY DAY AS NEEDED FOR FLUID 30 tablet 5  . gefitinib (IRESSA) 250 MG tablet Take 1 tablet (250 mg total) by mouth daily. 30 tablet 5  . HYDROmorphone (DILAUDID) 4 MG tablet Take 1 tablet (4 mg total) by mouth every 6 (six) hours as needed for severe pain. (Patient not taking: Reported on 05/14/2017) 20 tablet 0  . LUTEIN PO Take 1 capsule by mouth daily. Reported on 10/27/2015    . magnesium 30 MG tablet Take 30 mg by mouth every morning.    . Multiple Vitamins-Minerals (AIRBORNE) CHEW Chew 1 tablet by mouth daily.    . Multiple Vitamins-Minerals (  HAIR/SKIN/NAILS) TABS Take 1 tablet by mouth daily.    . NON FORMULARY Eye In jection every 5-6 weeks.    . NON FORMULARY Place 1 drop into both eyes. Thera Tears    . Omega-3 Fatty Acids (FISH OIL) 1200 MG CAPS Take 1,200 mg by mouth daily.    . ondansetron (ZOFRAN ODT) 4 MG disintegrating tablet Take 1 tablet (4 mg total) by mouth every 8 (eight) hours as needed. (Patient not taking: Reported on 06/18/2017) 10 tablet 1  . potassium chloride (K-DUR,KLOR-CON) 10 MEQ tablet Take 1 tablet (10 mEq total) by mouth daily. 30 tablet 6  . warfarin (COUMADIN) 5 MG tablet TAKE 1 TABLET BY MOUTH DAILY  EXCEPT 1 AND 1/2 TABLETS ON TUESDAYS AND FRIDAYS. 40 tablet 6   No current facility-administered medications for this visit.     SURGICAL HISTORY:  Past Surgical History:  Procedure Laterality Date  . Bladder tack  1996  . CARDIOVERSION  11/02/09   x 2  . CATARACT EXTRACTION  2010/2011  . COLONOSCOPY  3 YRS AGO  . COLONOSCOPY  03/06/2011   Procedure: COLONOSCOPY;  Surgeon: Rogene Houston, MD;  [facility];  Service: Endoscopy;  Laterality: N/A;  7:30 am  . ESOPHAGOGASTRODUODENOSCOPY    . FLEXIBLE SIGMOIDOSCOPY N/A 10/04/2012   Procedure: FLEXIBLE SIGMOIDOSCOPY;  Surgeon: Rogene Houston, MD;  Location[facility];  Service: Endoscopy;  Laterality: N/A;  11:00-moved to 1015 Ann to notify pt  . THYROID SURGERY  1961  . TONSILLECTOMY    . VIDEO BRONCHOSCOPY  05/31/2011   Procedure: VIDEO BRONCHOSCOPY;  Surgeon: Grace Isaac, MD;  Location[facility];  Service: Thoracic;  Laterality: N/A;    REVIEW OF SYSTEMS:  A comprehensive review of systems was negative.   PHYSICAL EXAMINATION: General appearance: alert, cooperative and no distress Head: Normocephalic, without obvious abnormality, atraumatic Neck: no adenopathy, no JVD, supple, symmetrical, trachea midline and thyroid not enlarged, symmetric, no tenderness/mass/nodules Lymph nodes: Cervical, supraclavicular, and axillary nodes normal. Resp: clear to auscultation bilaterally Back: symmetric, no curvature. ROM normal. No CVA tenderness. Cardio: regular rate and rhythm, S1, S2 normal, no murmur, click, rub or gallop GI: soft, non-tender; bowel sounds normal; no masses,  no organomegaly Extremities: extremities normal, atraumatic, no cyanosis or edema  ECOG PERFORMANCE STATUS: 0 - Asymptomatic  Blood pressure 121/60, pulse 77, temperature 97.6 F (36.4 C), temperature source Oral, resp. rate 18, height 5' 3"  (1.6 m), weight 111 lb 9.6 oz (50.6 kg), SpO2 98 %.  LABORATORY DATA: Lab Results  Component Value Date    WBC 4.5 08/13/2017   HGB 13.6 06/18/2017   HCT 43.2 08/13/2017   MCV 92.5 08/13/2017   PLT 247 08/13/2017      Chemistry      Component Value Date/Time   NA 141 07/16/2017 1226   NA 141 05/14/2017 1304   K 3.7 07/16/2017 1226   K 4.3 05/14/2017 1304   CL 103 07/16/2017 1226   CO2 28 07/16/2017 1226   CO2 30 (H) 05/14/2017 1304   BUN 17 07/16/2017 1226   BUN 19.0 05/14/2017 1304   CREATININE 0.79 07/16/2017 1226   CREATININE 0.8 05/14/2017 1304      Component Value Date/Time   CALCIUM 9.6 07/16/2017 1226   CALCIUM 9.1 05/14/2017 1304   ALKPHOS 91 07/16/2017 1226   ALKPHOS 86 05/14/2017 1304   AST 30 07/16/2017 1226   AST 25 05/14/2017 1304   ALT 17 07/16/2017 1226  ALT 15 05/14/2017 1304   BILITOT 0.8 07/16/2017 1226   BILITOT 0.57 05/14/2017 1304       RADIOGRAPHIC STUDIES: No results found.  ASSESSMENT AND PLAN:  This is a very pleasant 84 years old white female with recurrent non-small cell lung cancer, adenocarcinoma with positive EGFR mutation. The patient has been on treatment with Iressa 250 mg p.o. daily for the last 24 months. She is tolerating this treatment well with no significant adverse effects. I recommended for her to continue her treatment as a schedule and she will come back for follow-up visit in 1 month for evaluation after repeating CT scan of the chest for restaging of her disease. She was advised to call immediately if she has any concerning symptoms in the interval. The patient voices understanding of current disease status and treatment options and is in agreement with the current care plan. All questions were answered. The patient knows to call the clinic with any problems, questions or concerns. We can certainly see the patient much sooner if necessary.  Disclaimer: This note was dictated with voice recognition software. Similar sounding words can inadvertently be transcribed and may not be corrected upon review.

## 2017-08-17 DIAGNOSIS — Z1389 Encounter for screening for other disorder: Secondary | ICD-10-CM | POA: Diagnosis not present

## 2017-08-17 DIAGNOSIS — I482 Chronic atrial fibrillation: Secondary | ICD-10-CM | POA: Diagnosis not present

## 2017-08-17 DIAGNOSIS — H35342 Macular cyst, hole, or pseudohole, left eye: Secondary | ICD-10-CM | POA: Diagnosis not present

## 2017-08-17 DIAGNOSIS — Z681 Body mass index (BMI) 19 or less, adult: Secondary | ICD-10-CM | POA: Diagnosis not present

## 2017-08-17 DIAGNOSIS — I5032 Chronic diastolic (congestive) heart failure: Secondary | ICD-10-CM | POA: Diagnosis not present

## 2017-08-17 DIAGNOSIS — Z1331 Encounter for screening for depression: Secondary | ICD-10-CM | POA: Diagnosis not present

## 2017-08-17 DIAGNOSIS — K5100 Ulcerative (chronic) pancolitis without complications: Secondary | ICD-10-CM | POA: Diagnosis not present

## 2017-08-17 DIAGNOSIS — C3411 Malignant neoplasm of upper lobe, right bronchus or lung: Secondary | ICD-10-CM | POA: Diagnosis not present

## 2017-09-04 ENCOUNTER — Ambulatory Visit (INDEPENDENT_AMBULATORY_CARE_PROVIDER_SITE_OTHER): Payer: Medicare Other | Admitting: *Deleted

## 2017-09-04 DIAGNOSIS — I4891 Unspecified atrial fibrillation: Secondary | ICD-10-CM | POA: Diagnosis not present

## 2017-09-04 DIAGNOSIS — Z5181 Encounter for therapeutic drug level monitoring: Secondary | ICD-10-CM | POA: Diagnosis not present

## 2017-09-04 LAB — POCT INR: INR: 1.9

## 2017-09-04 NOTE — Patient Instructions (Signed)
Increase coumadin to 1 tablet daily except 1 1/2 tablets on Tuesdays, Thursdays and Saturdays Recheck in 2 weeks

## 2017-09-12 ENCOUNTER — Ambulatory Visit (HOSPITAL_COMMUNITY)
Admission: RE | Admit: 2017-09-12 | Discharge: 2017-09-12 | Disposition: A | Discharge status: Home / Self Care | Payer: Medicare Other | Source: Ambulatory Visit | Attending: Internal Medicine | Admitting: Internal Medicine

## 2017-09-12 DIAGNOSIS — C3490 Malignant neoplasm of unspecified part of unspecified bronchus or lung: Secondary | ICD-10-CM

## 2017-09-12 DIAGNOSIS — Z08 Encounter for follow-up examination after completed treatment for malignant neoplasm: Secondary | ICD-10-CM | POA: Insufficient documentation

## 2017-09-12 MED ORDER — IOPAMIDOL (ISOVUE-300) INJECTION 61%
80.0000 mL | Freq: Once | INTRAVENOUS | Status: AC | PRN
  Administered 2017-09-12: 80 mL via INTRAVENOUS

## 2017-09-17 ENCOUNTER — Inpatient Hospital Stay: Payer: Medicare Other | Attending: Internal Medicine | Admitting: Oncology

## 2017-09-17 ENCOUNTER — Inpatient Hospital Stay: Payer: Medicare Other

## 2017-09-17 ENCOUNTER — Telehealth: Payer: Self-pay

## 2017-09-17 ENCOUNTER — Other Ambulatory Visit: Payer: Self-pay

## 2017-09-17 ENCOUNTER — Encounter: Payer: Self-pay | Admitting: Oncology

## 2017-09-17 VITALS — BP 137/71 | HR 74 | Temp 97.8°F | Resp 17 | Ht 63.0 in | Wt 109.8 lb

## 2017-09-17 DIAGNOSIS — Z5181 Encounter for therapeutic drug level monitoring: Secondary | ICD-10-CM

## 2017-09-17 DIAGNOSIS — I4891 Unspecified atrial fibrillation: Secondary | ICD-10-CM | POA: Insufficient documentation

## 2017-09-17 DIAGNOSIS — C3411 Malignant neoplasm of upper lobe, right bronchus or lung: Secondary | ICD-10-CM

## 2017-09-17 DIAGNOSIS — Z79899 Other long term (current) drug therapy: Secondary | ICD-10-CM | POA: Diagnosis not present

## 2017-09-17 DIAGNOSIS — Z7901 Long term (current) use of anticoagulants: Secondary | ICD-10-CM | POA: Diagnosis not present

## 2017-09-17 LAB — CBC WITH DIFFERENTIAL (CANCER CENTER ONLY)
Basophils Absolute: 0 10*3/uL (ref 0.0–0.1)
Basophils Relative: 1 %
EOS ABS: 0.2 10*3/uL (ref 0.0–0.5)
Eosinophils Relative: 5 %
HEMATOCRIT: 41.4 % (ref 34.8–46.6)
HEMOGLOBIN: 13.6 g/dL (ref 11.6–15.9)
LYMPHS ABS: 1 10*3/uL (ref 0.9–3.3)
LYMPHS PCT: 26 %
MCH: 30.2 pg (ref 25.1–34.0)
MCHC: 32.9 g/dL (ref 31.5–36.0)
MCV: 91.8 fL (ref 79.5–101.0)
MONOS PCT: 12 %
Monocytes Absolute: 0.5 10*3/uL (ref 0.1–0.9)
Neutro Abs: 2.2 10*3/uL (ref 1.5–6.5)
Neutrophils Relative %: 56 %
Platelet Count: 225 10*3/uL (ref 145–400)
RBC: 4.51 MIL/uL (ref 3.70–5.45)
RDW: 13.9 % (ref 11.2–14.5)
WBC: 3.9 10*3/uL (ref 3.9–10.3)

## 2017-09-17 LAB — CMP (CANCER CENTER ONLY)
ALK PHOS: 82 U/L (ref 40–150)
ALT: 15 U/L (ref 0–55)
AST: 27 U/L (ref 5–34)
Albumin: 3.6 g/dL (ref 3.5–5.0)
Anion gap: 5 (ref 3–11)
BILIRUBIN TOTAL: 0.4 mg/dL (ref 0.2–1.2)
BUN: 16 mg/dL (ref 7–26)
CALCIUM: 9 mg/dL (ref 8.4–10.4)
CO2: 30 mmol/L — AB (ref 22–29)
Chloride: 105 mmol/L (ref 98–109)
Creatinine: 0.81 mg/dL (ref 0.60–1.10)
Glucose, Bld: 108 mg/dL (ref 70–140)
Potassium: 3.9 mmol/L (ref 3.5–5.1)
SODIUM: 140 mmol/L (ref 136–145)
TOTAL PROTEIN: 6.7 g/dL (ref 6.4–8.3)

## 2017-09-17 NOTE — Telephone Encounter (Signed)
Printed avs and calender of upcoming appointment. Per 5/6 los

## 2017-09-17 NOTE — Progress Notes (Signed)
[place] OFFICE PROGRESS NOTE  [name], MD [place] [address]  DIAGNOSIS: Recurrent non-small cell lung cancer presented with bilateral lung nodules in October 2016 initially diagnosed as a stage IA adenocarcinoma with positive EGFR mutation with deletion in exon 58 in December 2012.  PRIOR THERAPY: Status post bronchoscopy with right VATS and right upper lobectomy with lymph node dissection under the care of Dr. Servando Snare on 05/31/2011.  CURRENT THERAPY: Iressa 250 mg by mouth daily started 04/17/2015, status post 28 months of treatment.  INTERVAL HISTORY: SHAMERA YARBERRY 84 y.o. female returns for routine follow-up visit accompanied by her friend.  The patient is feeling fine today has no specific complaints.  The patient denies fevers and chills.  Denies chest pain, shortness breath, cough, hemoptysis.  Denies nausea, vomiting, constipation, diarrhea.  Denies skin rash.  Denies recent weight loss or night sweats.  The patient continues to tolerate treatment with Iressa fairly well.  She had a recent restaging CT scan and is here to discuss the results.  MEDICAL HISTORY: Past Medical History:  Diagnosis Date  . Arthritis   . Atrial fibrillation ([place])   . Cataract of both eyes   . Chronic back pain   . Chronic diarrhea   . Colitis   . Hemorrhoids   . History of colon polyps   . History of migraines   . Hyperlipidemia   . Hypothyroidism   . Macular degeneration, dry   . Malignant neoplasm of right upper lobe of lung (HCC)    Stage 1A  EGFR positive  Deletion in exon 19.  ALK-negative  Right Upper Lobe Lung Mass  SUV 2.9 on PET Status post VATS and right upper lobe lobectomy 05/31/2011  . Mucus in stool   . Nephrolithiasis    1994 - passed on its on    ALLERGIES:  is allergic to doxycycline; entocort ec [budesonide]; neurontin [gabapentin]; vicodin [hydrocodone-acetaminophen]; adhesive [tape]; amoxicillin; chocolate; codeine; levaquin [levofloxacin  in d5w]; morphine and related; naprosyn [naproxen]; penicillins; prednisone; tramadol; and uncoded nonscreenable allergen.  MEDICATIONS:  Current Outpatient Medications  Medication Sig Dispense Refill  . APRISO 0.375 g 24 hr capsule TAKE 2 CAPSULE BY MOUTH TWICE DAILY 120 capsule 11  . Calcium Carbonate (CALCIUM 600 PO) Take 1 tablet by mouth 2 (two) times daily.    Marland Kitchen CARTIA XT 180 MG 24 hr capsule TAKE ONE CAPSULE BY MOUTH EVERY DAY 30 capsule 6  . Cholecalciferol 2000 UNITS CAPS Take 2,000 Units by mouth daily.     . furosemide (LASIX) 20 MG tablet TAKE 1 TABLET BY MOUTH EVERY DAY AS NEEDED FOR FLUID 30 tablet 5  . gefitinib (IRESSA) 250 MG tablet Take 1 tablet (250 mg total) by mouth daily. 30 tablet 5  . LUTEIN PO Take 1 capsule by mouth daily. Reported on 10/27/2015    . magnesium 30 MG tablet Take 30 mg by mouth every morning.    . Multiple Vitamins-Minerals (AIRBORNE) CHEW Chew 1 tablet by mouth daily.    . Multiple Vitamins-Minerals (HAIR/SKIN/NAILS) TABS Take 1 tablet by mouth daily.    . NON FORMULARY Eye In jection every 5-6 weeks.    . NON FORMULARY Place 1 drop into both eyes. Thera Tears    . Omega-3 Fatty Acids (FISH OIL) 1200 MG CAPS Take 1,200 mg by mouth daily.    . potassium chloride (K-DUR,KLOR-CON) 10 MEQ tablet Take 1 tablet (10 mEq total) by mouth daily. 30 tablet 6  .  warfarin (COUMADIN) 5 MG tablet TAKE 1 TABLET BY MOUTH DAILY EXCEPT 1 AND 1/2 TABLETS ON TUESDAYS AND FRIDAYS. 40 tablet 6  . diclofenac sodium (VOLTAREN) 1 % GEL Apply 1 application topically 2 (two) times daily as needed (for neck pain).     Marland Kitchen HYDROmorphone (DILAUDID) 4 MG tablet Take 1 tablet (4 mg total) by mouth every 6 (six) hours as needed for severe pain. (Patient not taking: Reported on 05/14/2017) 20 tablet 0  . ondansetron (ZOFRAN ODT) 4 MG disintegrating tablet Take 1 tablet (4 mg total) by mouth every 8 (eight) hours as needed. (Patient not taking: Reported on 06/18/2017) 10 tablet 1   No  current facility-administered medications for this visit.     SURGICAL HISTORY:  Past Surgical History:  Procedure Laterality Date  . Bladder tack  1996  . CARDIOVERSION  11/02/09   x 2  . CATARACT EXTRACTION  2010/2011  . COLONOSCOPY  3 YRS AGO  . COLONOSCOPY  03/06/2011   Procedure: COLONOSCOPY;  Surgeon: Rogene Houston, MD;  Location: [facility];  Service: Endoscopy;  Laterality: N/A;  7:30 am  . ESOPHAGOGASTRODUODENOSCOPY    . FLEXIBLE SIGMOIDOSCOPY N/A 10/04/2012   Procedure: FLEXIBLE SIGMOIDOSCOPY;  Surgeon: Rogene Houston, MD;  Location: [facility];  Service: Endoscopy;  Laterality: N/A;  11:00-moved to 1015 Ann to notify pt  . THYROID SURGERY  1961  . TONSILLECTOMY    . VIDEO BRONCHOSCOPY  05/31/2011   Procedure: VIDEO BRONCHOSCOPY;  Surgeon: Grace Isaac, MD;  Location[facility] OR;  Service: Thoracic;  Laterality: N/A;    REVIEW OF SYSTEMS:   Review of Systems  Constitutional: Negative for appetite change, chills, fatigue, fever and unexpected weight change.  HENT:   Negative for mouth sores, nosebleeds, sore throat and trouble swallowing.   Eyes: Negative for eye problems and icterus.  Respiratory: Negative for cough, hemoptysis, shortness of breath and wheezing.   Cardiovascular: Negative for chest pain and leg swelling.  Gastrointestinal: Negative for abdominal pain, constipation, diarrhea, nausea and vomiting.  Genitourinary: Negative for bladder incontinence, difficulty urinating, dysuria, frequency and hematuria.   Musculoskeletal: Negative for back pain, gait problem, neck pain and neck stiffness.  Skin: Negative for itching and rash.  Neurological: Negative for dizziness, extremity weakness, gait problem, headaches, light-headedness and seizures.  Hematological: Negative for adenopathy. Does not bruise/bleed easily.  Psychiatric/Behavioral: Negative for confusion, depression and sleep disturbance. The patient is not nervous/anxious.     PHYSICAL  EXAMINATION:  Blood pressure 137/71, pulse 74, temperature 97.8 F (36.6 C), temperature source Oral, resp. rate 17, height 5' 3"  (1.6 m), weight 109 lb 12.8 oz (49.8 kg), SpO2 100 %.  ECOG PERFORMANCE STATUS: 1 - Symptomatic but completely ambulatory  Physical Exam  Constitutional: Oriented to person, place, and time and well-developed, well-nourished, and in no distress. No distress.  HENT:  Head: Normocephalic and atraumatic.  Mouth/Throat: Oropharynx is clear and moist. No oropharyngeal exudate.  Eyes: Conjunctivae are normal. Right eye exhibits no discharge. Left eye exhibits no discharge. No scleral icterus.  Neck: Normal range of motion. Neck supple.  Cardiovascular: Normal rate, irregular heart rate, normal heart sounds and intact distal pulses.   Pulmonary/Chest: Effort normal and breath sounds normal. No respiratory distress. No wheezes. No rales.  Abdominal: Soft. Bowel sounds are normal. Exhibits no distension and no mass. There is no tenderness.  Musculoskeletal: Normal range of motion. Exhibits no edema.  Lymphadenopathy:    No cervical adenopathy.  Neurological: Alert and  oriented to person, place, and time. Exhibits normal muscle tone. Gait normal. Coordination normal.  Skin: Skin is warm and dry. No rash noted. Not diaphoretic. No erythema. No pallor.  Psychiatric: Mood, memory and judgment normal.  Vitals reviewed.  LABORATORY DATA: Lab Results  Component Value Date   WBC 3.9 09/17/2017   HGB 13.6 09/17/2017   HCT 41.4 09/17/2017   MCV 91.8 09/17/2017   PLT 225 09/17/2017      Chemistry      Component Value Date/Time   NA 140 09/17/2017 1224   NA 141 05/14/2017 1304   K 3.9 09/17/2017 1224   K 4.3 05/14/2017 1304   CL 105 09/17/2017 1224   CO2 30 (H) 09/17/2017 1224   CO2 30 (H) 05/14/2017 1304   BUN 16 09/17/2017 1224   BUN 19.0 05/14/2017 1304   CREATININE 0.81 09/17/2017 1224   CREATININE 0.8 05/14/2017 1304      Component Value Date/Time    CALCIUM 9.0 09/17/2017 1224   CALCIUM 9.1 05/14/2017 1304   ALKPHOS 82 09/17/2017 1224   ALKPHOS 86 05/14/2017 1304   AST 27 09/17/2017 1224   AST 25 05/14/2017 1304   ALT 15 09/17/2017 1224   ALT 15 05/14/2017 1304   BILITOT 0.4 09/17/2017 1224   BILITOT 0.57 05/14/2017 1304       RADIOGRAPHIC STUDIES:  Ct Chest W Contrast  Result Date: 09/12/2017 CLINICAL DATA:  Restaging non-small cell lung carcinoma. EXAM: CT CHEST WITH CONTRAST TECHNIQUE: Multidetector CT imaging of the chest was performed during intravenous contrast administration. CONTRAST:  61m ISOVUE-300 IOPAMIDOL (ISOVUE-300) INJECTION 61% COMPARISON:  CT 06/11/2017 FINDINGS: Cardiovascular: No significant vascular findings. Normal heart size. No pericardial effusion. Mediastinum/Nodes: No axillary or supraclavicular adenopathy. No mediastinal hilar adenopathy. Low-density lesions within the LEFT and RIGHT lobe of thyroid gland measure 15 mm on the RIGHT and is 8 mm on LEFT not changed from CT 08/23/2016 1 year prior. No pericardial effusion. Esophagus normal. Lungs/Pleura: LEFT upper lobe nodule measures 7 mm (image 19/4) compared to 8 mm on prior. On coronal images this is favored a confluence vessels as previously described. Small peripheral nodule in the more inferior LEFT upper lobe measures 4 mm unchanged (image 55/4). There is nodular pleural thickening along the superior aspect the LEFT and RIGHT oblique fissures not changed from prior. Mild bronchiectasis. Centrilobular emphysema noted throughout the lungs Upper Abdomen: Limited view of the liver, kidneys, pancreas are unremarkable. Normal adrenal glands. Musculoskeletal: No aggressive osseous lesion. IMPRESSION: 1. No evidence of lung cancer recurrence. 2. Stable nodular pleural thickening along the oblique fissures. 3. Stable small LEFT upper lobe pulmonary nodule and confluence of vessels in the LEFT lung apex. Electronically Signed   By: SSuzy BouchardM.D.   On: 09/12/2017  14:52     ASSESSMENT/PLAN:  Malignant neoplasm of right upper lobe of lung ([facility] This is a very pleasant 84year old white female with recurrent non-small cell lung cancer, adenocarcinoma with positive EGFR mutation. The patient has been on treatment with Iressa 250 mg p.o. daily for the last 28 months. She is tolerating this treatment well with no significant adverse effects. The patient had a recent restaging CT scan and is here to discuss the results.  The patient was seen with Dr. MJulien Nordmann  CT scan results were discussed with the patient and her friend which showed no evidence of disease progression.  Recommend that she continue treatment with Iressa 250 mg daily. The patient will follow-up in approximately  6 weeks for evaluation and repeat lab work.  She was advised to call immediately if she has any concerning symptoms in the interval. The patient voices understanding of current disease status and treatment options and is in agreement with the current care plan. All questions were answered. The patient knows to call the clinic with any problems, questions or concerns. We can certainly see the patient much sooner if necessary.   Orders Placed This Encounter  Procedures  . CBC with Differential (Cancer Center Only)    Standing Status:   Future    Standing Expiration Date:   09/18/2018  . CMP ([facility] only)    Standing Status:   Future    Standing Expiration Date:   09/18/2018   Mikey Bussing, DNP, AGPCNP-BC, AOCNP 09/17/17  ADDENDUM: Hematology/Oncology Attending: I had a face-to-face encounter with the patient today.  I recommended her care plan.  This is a very pleasant 84 years old white female with recurrent non-small cell lung cancer, adenocarcinoma with positive EGFR mutation.  The patient is currently on treatment with Iressa 250 mg p.o. daily status post 28 months of treatment. She has been tolerating this treatment fairly well with no concerning adverse effects.  The  patient had a repeat CT scan of the chest performed recently.  I personally and independently reviewed the scans and discussed the results with the patient today.  Her scan showed no concerning findings for disease progression.  I recommended for the patient to continue her current treatment with Iressa with the same dose. I will see her back for follow-up visit in 6 weeks for evaluation with repeat blood work. She was advised to call immediately if she has any concerning symptoms in the interval.  Disclaimer: This note was dictated with voice recognition software. Similar sounding words can inadvertently be transcribed and may be missed upon review. Eilleen Kempf, MD 09/17/17

## 2017-09-17 NOTE — Assessment & Plan Note (Signed)
This is a very pleasant 84 year old white female with recurrent non-small cell lung cancer, adenocarcinoma with positive EGFR mutation. The patient has been on treatment with Iressa 250 mg p.o. daily for the last 28 months. She is tolerating this treatment well with no significant adverse effects. The patient had a recent restaging CT scan and is here to discuss the results.  The patient was seen with Dr. Julien Nordmann.  CT scan results were discussed with the patient and her friend which showed no evidence of disease progression.  Recommend that she continue treatment with Iressa 250 mg daily. The patient will follow-up in approximately 6 weeks for evaluation and repeat lab work.  She was advised to call immediately if she has any concerning symptoms in the interval. The patient voices understanding of current disease status and treatment options and is in agreement with the current care plan. All questions were answered. The patient knows to call the clinic with any problems, questions or concerns. We can certainly see the patient much sooner if necessary.

## 2017-09-18 ENCOUNTER — Ambulatory Visit (INDEPENDENT_AMBULATORY_CARE_PROVIDER_SITE_OTHER): Payer: Medicare Other | Admitting: *Deleted

## 2017-09-18 DIAGNOSIS — Z5181 Encounter for therapeutic drug level monitoring: Secondary | ICD-10-CM

## 2017-09-18 DIAGNOSIS — I4891 Unspecified atrial fibrillation: Secondary | ICD-10-CM

## 2017-09-18 LAB — POCT INR: INR: 2.5

## 2017-09-18 NOTE — Patient Instructions (Signed)
Continue coumadin 1 tablet daily except 1 1/2 tablets on Tuesdays, Thursdays and Saturdays Recheck in 4 weeks

## 2017-09-21 NOTE — Telephone Encounter (Signed)
Opened by error.

## 2017-09-24 DIAGNOSIS — H35371 Puckering of macula, right eye: Secondary | ICD-10-CM | POA: Diagnosis not present

## 2017-09-24 DIAGNOSIS — H43813 Vitreous degeneration, bilateral: Secondary | ICD-10-CM | POA: Diagnosis not present

## 2017-09-24 DIAGNOSIS — H353231 Exudative age-related macular degeneration, bilateral, with active choroidal neovascularization: Secondary | ICD-10-CM | POA: Diagnosis not present

## 2017-10-08 ENCOUNTER — Other Ambulatory Visit: Payer: Self-pay | Admitting: Cardiology

## 2017-10-12 ENCOUNTER — Telehealth: Payer: Self-pay | Admitting: *Deleted

## 2017-10-12 DIAGNOSIS — Z681 Body mass index (BMI) 19 or less, adult: Secondary | ICD-10-CM | POA: Diagnosis not present

## 2017-10-12 DIAGNOSIS — K140 Glossitis: Secondary | ICD-10-CM | POA: Diagnosis not present

## 2017-10-12 NOTE — Telephone Encounter (Signed)
Reviewed pt concern with NP, discussed with pt NP recommends Magic Mouthwash, Claritin instead of benadryl and baking soda saltwater rinses. Pt  Advised she has a Rx from GP for [facility] mouthwash and will pick up Claritin at drug store. No further concerns.

## 2017-10-12 NOTE — Telephone Encounter (Signed)
I went to see my GP and he said I should talk to Dr. Julien Nordmann. Mouth is scalded"It's more in my bottom lip is swollen, began yesterday am. I used peroxide rinse 4x yesterday. And I thought it helped but this morning I woke up and my goodness I could hardly talk." Recommended pt use baking soda, salt and warm water rinses, take benadryl. Will review with NP and call pt back with additional recommendations.

## 2017-10-15 ENCOUNTER — Telehealth: Payer: Self-pay | Admitting: Internal Medicine

## 2017-10-15 NOTE — Telephone Encounter (Signed)
Left msg on pt voicemail re rescheduling of appts from 6/18 to 7/9 starting @ 1:00. Mailed calendar reminder.

## 2017-10-16 ENCOUNTER — Ambulatory Visit (INDEPENDENT_AMBULATORY_CARE_PROVIDER_SITE_OTHER): Payer: Medicare Other | Admitting: *Deleted

## 2017-10-16 DIAGNOSIS — Z5181 Encounter for therapeutic drug level monitoring: Secondary | ICD-10-CM

## 2017-10-16 DIAGNOSIS — I4891 Unspecified atrial fibrillation: Secondary | ICD-10-CM | POA: Diagnosis not present

## 2017-10-16 LAB — POCT INR: INR: 3 (ref 2.0–3.0)

## 2017-10-16 NOTE — Patient Instructions (Signed)
Continue coumadin 1 tablet daily except 1 1/2 tablets on Tuesdays, Thursdays and Saturdays Recheck in 4 weeks

## 2017-10-29 DIAGNOSIS — H353211 Exudative age-related macular degeneration, right eye, with active choroidal neovascularization: Secondary | ICD-10-CM | POA: Diagnosis not present

## 2017-10-29 DIAGNOSIS — H353231 Exudative age-related macular degeneration, bilateral, with active choroidal neovascularization: Secondary | ICD-10-CM | POA: Diagnosis not present

## 2017-10-30 ENCOUNTER — Other Ambulatory Visit: Payer: Medicare Other

## 2017-10-30 ENCOUNTER — Ambulatory Visit: Payer: Medicare Other | Admitting: Internal Medicine

## 2017-11-13 ENCOUNTER — Ambulatory Visit (INDEPENDENT_AMBULATORY_CARE_PROVIDER_SITE_OTHER): Payer: Medicare Other | Admitting: *Deleted

## 2017-11-13 DIAGNOSIS — I4891 Unspecified atrial fibrillation: Secondary | ICD-10-CM | POA: Diagnosis not present

## 2017-11-13 DIAGNOSIS — Z5181 Encounter for therapeutic drug level monitoring: Secondary | ICD-10-CM | POA: Diagnosis not present

## 2017-11-13 LAB — POCT INR: INR: 2.4 (ref 2.0–3.0)

## 2017-11-13 NOTE — Patient Instructions (Signed)
Continue coumadin 1 tablet daily except 1 1/2 tablets on Tuesdays, Thursdays and Saturdays Recheck in 6 weeks

## 2017-11-20 ENCOUNTER — Inpatient Hospital Stay (HOSPITAL_BASED_OUTPATIENT_CLINIC_OR_DEPARTMENT_OTHER): Payer: Medicare Other | Admitting: Internal Medicine

## 2017-11-20 ENCOUNTER — Inpatient Hospital Stay: Payer: Medicare Other | Attending: Internal Medicine

## 2017-11-20 ENCOUNTER — Encounter: Payer: Self-pay | Admitting: Internal Medicine

## 2017-11-20 ENCOUNTER — Telehealth: Payer: Self-pay

## 2017-11-20 VITALS — BP 123/81 | HR 78 | Temp 97.8°F | Resp 18 | Ht 63.0 in | Wt 109.3 lb

## 2017-11-20 DIAGNOSIS — Z5181 Encounter for therapeutic drug level monitoring: Secondary | ICD-10-CM

## 2017-11-20 DIAGNOSIS — Z79899 Other long term (current) drug therapy: Secondary | ICD-10-CM | POA: Diagnosis not present

## 2017-11-20 DIAGNOSIS — C3490 Malignant neoplasm of unspecified part of unspecified bronchus or lung: Secondary | ICD-10-CM

## 2017-11-20 DIAGNOSIS — I4891 Unspecified atrial fibrillation: Secondary | ICD-10-CM

## 2017-11-20 DIAGNOSIS — C3411 Malignant neoplasm of upper lobe, right bronchus or lung: Secondary | ICD-10-CM | POA: Insufficient documentation

## 2017-11-20 DIAGNOSIS — Z86010 Personal history of colonic polyps: Secondary | ICD-10-CM | POA: Insufficient documentation

## 2017-11-20 DIAGNOSIS — Z7901 Long term (current) use of anticoagulants: Secondary | ICD-10-CM | POA: Insufficient documentation

## 2017-11-20 LAB — CMP (CANCER CENTER ONLY)
ALBUMIN: 3.9 g/dL (ref 3.5–5.0)
ALT: 20 U/L (ref 0–44)
ANION GAP: 6 (ref 5–15)
AST: 30 U/L (ref 15–41)
Alkaline Phosphatase: 90 U/L (ref 38–126)
BUN: 15 mg/dL (ref 8–23)
CHLORIDE: 104 mmol/L (ref 98–111)
CO2: 31 mmol/L (ref 22–32)
Calcium: 9.5 mg/dL (ref 8.9–10.3)
Creatinine: 0.85 mg/dL (ref 0.44–1.00)
GFR, Est AFR Am: >60 mL/min (ref >60)
GFR, Estimated: >60 mL/min (ref >60)
GLUCOSE: 130 mg/dL — AB (ref 70–99)
POTASSIUM: 4.2 mmol/L (ref 3.5–5.1)
SODIUM: 141 mmol/L (ref 135–145)
Total Bilirubin: 0.7 mg/dL (ref 0.3–1.2)
Total Protein: 7.1 g/dL (ref 6.5–8.1)

## 2017-11-20 LAB — CBC WITH DIFFERENTIAL (CANCER CENTER ONLY)
BASOS ABS: 0 10*3/uL (ref 0.0–0.1)
Basophils Relative: 1 %
EOS PCT: 5 %
Eosinophils Absolute: 0.2 10*3/uL (ref 0.0–0.5)
HEMATOCRIT: 42 % (ref 34.8–46.6)
Hemoglobin: 13.8 g/dL (ref 11.6–15.9)
LYMPHS ABS: 1 10*3/uL (ref 0.9–3.3)
LYMPHS PCT: 26 %
MCH: 30.5 pg (ref 25.1–34.0)
MCHC: 32.9 g/dL (ref 31.5–36.0)
MCV: 92.7 fL (ref 79.5–101.0)
MONO ABS: 0.5 10*3/uL (ref 0.1–0.9)
Monocytes Relative: 13 %
NEUTROS ABS: 2 10*3/uL (ref 1.5–6.5)
Neutrophils Relative %: 55 %
PLATELETS: 221 10*3/uL (ref 145–400)
RBC: 4.53 MIL/uL (ref 3.70–5.45)
RDW: 14.1 % (ref 11.2–14.5)
WBC Count: 3.7 10*3/uL — ABNORMAL LOW (ref 3.9–10.3)

## 2017-11-20 NOTE — Telephone Encounter (Signed)
Printed avs and calender of upcoming appointment. Per 7/9 los

## 2017-11-20 NOTE — Progress Notes (Signed)
    [facility] [phone]   [phone]  OFFICE PROGRESS NOTE  Daniel, Terry G, MD [address] [place] [address]  DIAGNOSIS: Recurrent non-small cell lung cancer presented with bilateral lung nodules in October 2016 initially diagnosed as a stage IA adenocarcinoma with positive EGFR mutation with deletion in exon 19 in December 2012.  PRIOR THERAPY: Status post bronchoscopy with right VATS and right upper lobectomy with lymph node dissection under the care of Dr. Gerhardt on 05/31/2011.  CURRENT THERAPY: Iressa 250 mg by mouth daily started 04/17/2015, status post 25 months of treatment.  INTERVAL HISTORY: Natalie Carlson 84 y.o. female returns to the clinic today for follow-up visit accompanied by her friend.  The patient is feeling fine with no concerning complaints.  She denied having any chest pain, shortness of breath, cough or hemoptysis.  She denied having any fever or chills.  She has no nausea, vomiting, diarrhea or constipation.  She has no weight loss or night sweats.  She continues to tolerate her treatment with Iressa fairly well.  She is here today for evaluation and repeat blood work.   MEDICAL HISTORY: Past Medical History:  Diagnosis Date  . Arthritis   . Atrial fibrillation ([facility])   . Cataract of both eyes   . Chronic back pain   . Chronic diarrhea   . Colitis   . Hemorrhoids   . History of colon polyps   . History of migraines   . Hyperlipidemia   . Hypothyroidism   . Macular degeneration, dry   . Malignant neoplasm of right upper lobe of lung (HCC)    Stage 1A  EGFR positive  Deletion in exon 19.  ALK-negative  Right Upper Lobe Lung Mass  SUV 2.9 on PET Status post VATS and right upper lobe lobectomy 05/31/2011  . Mucus in stool   . Nephrolithiasis    1994 - passed on its on    ALLERGIES:  is allergic to doxycycline; entocort ec [budesonide]; neurontin [gabapentin]; vicodin [hydrocodone-acetaminophen]; adhesive [tape];  amoxicillin; chocolate; codeine; levaquin [levofloxacin in d5w]; morphine and related; naprosyn [naproxen]; penicillins; prednisone; tramadol; and uncoded nonscreenable allergen.  MEDICATIONS:  Current Outpatient Medications  Medication Sig Dispense Refill  . APRISO 0.375 g 24 hr capsule TAKE 2 CAPSULE BY MOUTH TWICE DAILY 120 capsule 11  . Calcium Carbonate (CALCIUM 600 PO) Take 1 tablet by mouth 2 (two) times daily.    . CARTIA XT 180 MG 24 hr capsule TAKE ONE CAPSULE BY MOUTH EVERY DAY 30 capsule 6  . Cholecalciferol 2000 UNITS CAPS Take 2,000 Units by mouth daily.     . diclofenac sodium (VOLTAREN) 1 % GEL Apply 1 application topically 2 (two) times daily as needed (for neck pain).     . furosemide (LASIX) 20 MG tablet TAKE 1 TABLET BY MOUTH EVERY DAY AS NEEDED FOR FLUID 30 tablet 5  . gefitinib (IRESSA) 250 MG tablet Take 1 tablet (250 mg total) by mouth daily. 30 tablet 5  . HYDROmorphone (DILAUDID) 4 MG tablet Take 1 tablet (4 mg total) by mouth every 6 (six) hours as needed for severe pain. (Patient not taking: Reported on 05/14/2017) 20 tablet 0  . LUTEIN PO Take 1 capsule by mouth daily. Reported on 10/27/2015    . magnesium 30 MG tablet Take 30 mg by mouth every morning.    . Multiple Vitamins-Minerals (AIRBORNE) CHEW Chew 1 tablet by mouth daily.    . Multiple Vitamins-Minerals (HAIR/SKIN/NAILS)   TABS Take 1 tablet by mouth daily.    . NON FORMULARY Eye In jection every 5-6 weeks.    . NON FORMULARY Place 1 drop into both eyes. Thera Tears    . Omega-3 Fatty Acids (FISH OIL) 1200 MG CAPS Take 1,200 mg by mouth daily.    . ondansetron (ZOFRAN ODT) 4 MG disintegrating tablet Take 1 tablet (4 mg total) by mouth every 8 (eight) hours as needed. (Patient not taking: Reported on 06/18/2017) 10 tablet 1  . potassium chloride (K-DUR,KLOR-CON) 10 MEQ tablet Take 1 tablet (10 mEq total) by mouth daily. 30 tablet 6  . warfarin (COUMADIN) 5 MG tablet TAKE ONE TABLET BY MOUTH DAILY EXCEPT ONE AND  ONE-HALF TABLETS ON TUESDAYS AND FRIDAYS 40 tablet 6   No current facility-administered medications for this visit.     SURGICAL HISTORY:  Past Surgical History:  Procedure Laterality Date  . Bladder tack  1996  . CARDIOVERSION  11/02/09   x 2  . CATARACT EXTRACTION  2010/2011  . COLONOSCOPY  3 YRS AGO  . COLONOSCOPY  03/06/2011   Procedure: COLONOSCOPY;  Surgeon: Najeeb U Rehman, MD;  [facility];  Service: Endoscopy;  Laterality: N/A;  7:30 am  . ESOPHAGOGASTRODUODENOSCOPY    . FLEXIBLE SIGMOIDOSCOPY N/A 10/04/2012   Procedure: FLEXIBLE SIGMOIDOSCOPY;  Surgeon: Najeeb U Rehman, MD;  [facility];  Service: Endoscopy;  Laterality: N/A;  11:00-moved to 1015 Ann to notify pt  . THYROID SURGERY  1961  . TONSILLECTOMY    . VIDEO BRONCHOSCOPY  05/31/2011   Procedure: VIDEO BRONCHOSCOPY;  Surgeon: Edward B Gerhardt, MD;  Location: [facility] OR;  Service: Thoracic;  Laterality: N/A;    REVIEW OF SYSTEMS:  A comprehensive review of systems was negative.   PHYSICAL EXAMINATION: General appearance: alert, cooperative and no distress Head: Normocephalic, without obvious abnormality, atraumatic Neck: no adenopathy, no JVD, supple, symmetrical, trachea midline and thyroid not enlarged, symmetric, no tenderness/mass/nodules Lymph nodes: Cervical, supraclavicular, and axillary nodes normal. Resp: clear to auscultation bilaterally Back: symmetric, no curvature. ROM normal. No CVA tenderness. Cardio: regular rate and rhythm, S1, S2 normal, no murmur, click, rub or gallop GI: soft, non-tender; bowel sounds normal; no masses,  no organomegaly Extremities: extremities normal, atraumatic, no cyanosis or edema  ECOG PERFORMANCE STATUS: 0 - Asymptomatic  Blood pressure 123/81, pulse 78, temperature 97.8 F (36.6 C), temperature source Oral, resp. rate 18, height 5' 3" (1.6 m), weight 109 lb 4.8 oz (49.6 kg), SpO2 98 %.  LABORATORY DATA: Lab Results  Component Value Date   WBC 3.7  (L) 11/20/2017   HGB 13.8 11/20/2017   HCT 42.0 11/20/2017   MCV 92.7 11/20/2017   PLT 221 11/20/2017      Chemistry      Component Value Date/Time   NA 140 09/17/2017 1224   NA 141 05/14/2017 1304   K 3.9 09/17/2017 1224   K 4.3 05/14/2017 1304   CL 105 09/17/2017 1224   CO2 30 (H) 09/17/2017 1224   CO2 30 (H) 05/14/2017 1304   BUN 16 09/17/2017 1224   BUN 19.0 05/14/2017 1304   CREATININE 0.81 09/17/2017 1224   CREATININE 0.8 05/14/2017 1304      Component Value Date/Time   CALCIUM 9.0 09/17/2017 1224   CALCIUM 9.1 05/14/2017 1304   ALKPHOS 82 09/17/2017 1224   ALKPHOS 86 05/14/2017 1304   AST 27 09/17/2017 1224   AST 25 05/14/2017 1304   ALT 15 09/17/2017 1224     ALT 15 05/14/2017 1304   BILITOT 0.4 09/17/2017 1224   BILITOT 0.57 05/14/2017 1304       RADIOGRAPHIC STUDIES: No results found.  ASSESSMENT AND PLAN:  This is a very pleasant 84 years old white female with recurrent non-small cell lung cancer, adenocarcinoma with positive EGFR mutation. The patient has been on treatment with Iressa 250 mg p.o. daily for the last 25 months. The patient is feeling fine today with no concerning complaints and she continues to tolerate her treatment well. I recommended for her to continue her current treatment with Iressa. I will see her back for follow-up visit in 6 weeks for evaluation with repeat blood work. She was advised to call immediately if she has any concerning symptoms in the interval. The patient voices understanding of current disease status and treatment options and is in agreement with the current care plan. All questions were answered. The patient knows to call the clinic with any problems, questions or concerns. We can certainly see the patient much sooner if necessary.  Disclaimer: This note was dictated with voice recognition software. Similar sounding words can inadvertently be transcribed and may not be corrected upon review.        

## 2017-12-06 ENCOUNTER — Other Ambulatory Visit: Payer: Self-pay | Admitting: Cardiology

## 2017-12-10 DIAGNOSIS — H353231 Exudative age-related macular degeneration, bilateral, with active choroidal neovascularization: Secondary | ICD-10-CM | POA: Diagnosis not present

## 2017-12-10 DIAGNOSIS — H43813 Vitreous degeneration, bilateral: Secondary | ICD-10-CM | POA: Diagnosis not present

## 2017-12-10 DIAGNOSIS — H35371 Puckering of macula, right eye: Secondary | ICD-10-CM | POA: Diagnosis not present

## 2017-12-19 DIAGNOSIS — M810 Age-related osteoporosis without current pathological fracture: Secondary | ICD-10-CM | POA: Diagnosis not present

## 2017-12-19 DIAGNOSIS — I482 Chronic atrial fibrillation: Secondary | ICD-10-CM | POA: Diagnosis not present

## 2017-12-19 DIAGNOSIS — Z681 Body mass index (BMI) 19 or less, adult: Secondary | ICD-10-CM | POA: Diagnosis not present

## 2017-12-19 DIAGNOSIS — I5032 Chronic diastolic (congestive) heart failure: Secondary | ICD-10-CM | POA: Diagnosis not present

## 2017-12-19 DIAGNOSIS — H35342 Macular cyst, hole, or pseudohole, left eye: Secondary | ICD-10-CM | POA: Diagnosis not present

## 2017-12-19 DIAGNOSIS — K5100 Ulcerative (chronic) pancolitis without complications: Secondary | ICD-10-CM | POA: Diagnosis not present

## 2017-12-19 DIAGNOSIS — C3411 Malignant neoplasm of upper lobe, right bronchus or lung: Secondary | ICD-10-CM | POA: Diagnosis not present

## 2017-12-25 ENCOUNTER — Ambulatory Visit (INDEPENDENT_AMBULATORY_CARE_PROVIDER_SITE_OTHER): Payer: Medicare Other | Admitting: *Deleted

## 2017-12-25 DIAGNOSIS — I4891 Unspecified atrial fibrillation: Secondary | ICD-10-CM

## 2017-12-25 DIAGNOSIS — Z5181 Encounter for therapeutic drug level monitoring: Secondary | ICD-10-CM | POA: Diagnosis not present

## 2017-12-25 LAB — POCT INR: INR: 2.6 (ref 2.0–3.0)

## 2017-12-25 NOTE — Patient Instructions (Signed)
Continue coumadin 1 tablet daily except 1 1/2 tablets on Tuesdays, Thursdays and Saturdays Recheck in 6 weeks

## 2018-01-01 ENCOUNTER — Other Ambulatory Visit: Payer: Self-pay

## 2018-01-01 ENCOUNTER — Telehealth: Payer: Self-pay | Admitting: Internal Medicine

## 2018-01-01 ENCOUNTER — Encounter: Payer: Self-pay | Admitting: Oncology

## 2018-01-01 ENCOUNTER — Inpatient Hospital Stay: Payer: Medicare Other | Attending: Internal Medicine

## 2018-01-01 ENCOUNTER — Inpatient Hospital Stay (HOSPITAL_BASED_OUTPATIENT_CLINIC_OR_DEPARTMENT_OTHER): Payer: Medicare Other | Admitting: Oncology

## 2018-01-01 VITALS — BP 130/88 | HR 76 | Temp 97.7°F | Resp 18 | Ht 63.0 in | Wt 108.4 lb

## 2018-01-01 DIAGNOSIS — Z7901 Long term (current) use of anticoagulants: Secondary | ICD-10-CM

## 2018-01-01 DIAGNOSIS — Z86010 Personal history of colonic polyps: Secondary | ICD-10-CM

## 2018-01-01 DIAGNOSIS — C3411 Malignant neoplasm of upper lobe, right bronchus or lung: Secondary | ICD-10-CM | POA: Insufficient documentation

## 2018-01-01 DIAGNOSIS — Z5181 Encounter for therapeutic drug level monitoring: Secondary | ICD-10-CM

## 2018-01-01 DIAGNOSIS — I4891 Unspecified atrial fibrillation: Secondary | ICD-10-CM

## 2018-01-01 DIAGNOSIS — Z79899 Other long term (current) drug therapy: Secondary | ICD-10-CM | POA: Diagnosis not present

## 2018-01-01 LAB — CBC WITH DIFFERENTIAL (CANCER CENTER ONLY)
Basophils Absolute: 0 10*3/uL (ref 0.0–0.1)
Basophils Relative: 1 %
Eosinophils Absolute: 0.1 10*3/uL (ref 0.0–0.5)
Eosinophils Relative: 2 %
HEMATOCRIT: 42.9 % (ref 34.8–46.6)
Hemoglobin: 14.1 g/dL (ref 11.6–15.9)
LYMPHS ABS: 0.9 10*3/uL (ref 0.9–3.3)
Lymphocytes Relative: 24 %
MCH: 30.4 pg (ref 25.1–34.0)
MCHC: 32.8 g/dL (ref 31.5–36.0)
MCV: 92.8 fL (ref 79.5–101.0)
MONOS PCT: 13 %
Monocytes Absolute: 0.5 10*3/uL (ref 0.1–0.9)
NEUTROS ABS: 2.4 10*3/uL (ref 1.5–6.5)
Neutrophils Relative %: 60 %
Platelet Count: 222 10*3/uL (ref 145–400)
RBC: 4.62 MIL/uL (ref 3.70–5.45)
RDW: 13.9 % (ref 11.2–14.5)
WBC Count: 4 10*3/uL (ref 3.9–10.3)

## 2018-01-01 LAB — CMP (CANCER CENTER ONLY)
ALT: 14 U/L (ref 0–44)
ANION GAP: 7 (ref 5–15)
AST: 27 U/L (ref 15–41)
Albumin: 3.8 g/dL (ref 3.5–5.0)
Alkaline Phosphatase: 84 U/L (ref 38–126)
BUN: 15 mg/dL (ref 8–23)
CHLORIDE: 103 mmol/L (ref 98–111)
CO2: 29 mmol/L (ref 22–32)
CREATININE: 0.78 mg/dL (ref 0.44–1.00)
Calcium: 9.1 mg/dL (ref 8.9–10.3)
GFR, Est AFR Am: >60 mL/min (ref >60)
GFR, Estimated: >60 mL/min (ref >60)
Glucose, Bld: 100 mg/dL — ABNORMAL HIGH (ref 70–99)
Potassium: 4.9 mmol/L (ref 3.5–5.1)
SODIUM: 139 mmol/L (ref 135–145)
Total Bilirubin: 0.7 mg/dL (ref 0.3–1.2)
Total Protein: 7.2 g/dL (ref 6.5–8.1)

## 2018-01-01 NOTE — Telephone Encounter (Signed)
Appts scheduled AVs/Calendar printed per 8/20 los

## 2018-01-01 NOTE — Assessment & Plan Note (Addendum)
This is a very pleasant 84 year old white female with recurrent non-small cell lung cancer, adenocarcinoma with positive EGFR mutation. The patient has been on treatment with Iressa 250 mg p.o. daily for the last 31 months. The patient is feeling fine today with no concerning complaints and she continues to tolerate her treatment well. I recommended for her to continue her current treatment with Iressa. The patient will have a restaging CT scan of the chest prior to her next visit.  She will follow-up in 6 weeks to discuss her restaging CT scan results.  I will see her back for follow-up visit in 6 weeks for evaluation with repeat blood work. She was advised to call immediately if she has any concerning symptoms in the interval. The patient voices understanding of current disease status and treatment options and is in agreement with the current care plan. All questions were answered. The patient knows to call the clinic with any problems, questions or concerns. We can certainly see the patient much sooner if necessary.

## 2018-01-01 NOTE — Progress Notes (Signed)
[place] OFFICE PROGRESS NOTE  [name], MD [place] [address]  DIAGNOSIS: Recurrent non-small cell lung cancer presented with bilateral lung nodules in October 2016 initially diagnosed as a stage IA adenocarcinoma with positive EGFR mutation with deletion in exon 59 in December 2012.  PRIOR THERAPY: Status post bronchoscopy with right VATS and right upper lobectomy with lymph node dissection under the care of Dr. Servando Snare on 05/31/2011.  CURRENT THERAPY: Iressa 250 mg by mouth daily started 04/17/2015, status post 31 months of treatment.  INTERVAL HISTORY: Natalie Carlson 84 y.o. female returns for routine follow-up visit accompanied by her friend.  The patient is feeling fine today with no specific complaints.  The patient denies fevers chills.  Denies chest pain, shortness of breath, cough, hemoptysis.  Denies nausea, vomiting, constipation, diarrhea.  Denies recent weight loss or night sweats.  The patient continues to tolerate treatment with Iressa fairly well.  The patient is here for evaluation and repeat lab work.  MEDICAL HISTORY: Past Medical History:  Diagnosis Date  . Arthritis   . Atrial fibrillation ([place])   . Cataract of both eyes   . Chronic back pain   . Chronic diarrhea   . Colitis   . Hemorrhoids   . History of colon polyps   . History of migraines   . Hyperlipidemia   . Hypothyroidism   . Macular degeneration, dry   . Malignant neoplasm of right upper lobe of lung (HCC)    Stage 1A  EGFR positive  Deletion in exon 19.  ALK-negative  Right Upper Lobe Lung Mass  SUV 2.9 on PET Status post VATS and right upper lobe lobectomy 05/31/2011  . Mucus in stool   . Nephrolithiasis    1994 - passed on its on    ALLERGIES:  is allergic to doxycycline; entocort ec [budesonide]; neurontin [gabapentin]; vicodin [hydrocodone-acetaminophen]; adhesive [tape]; amoxicillin; chocolate; codeine; levaquin [levofloxacin in d5w]; morphine and related;  naprosyn [naproxen]; penicillins; prednisone; tramadol; and uncoded nonscreenable allergen.  MEDICATIONS:  Current Outpatient Medications  Medication Sig Dispense Refill  . APRISO 0.375 g 24 hr capsule TAKE 2 CAPSULE BY MOUTH TWICE DAILY 120 capsule 11  . Calcium Carbonate (CALCIUM 600 PO) Take 1 tablet by mouth 2 (two) times daily.    Marland Kitchen CARTIA XT 180 MG 24 hr capsule TAKE ONE CAPSULE BY MOUTH EVERY DAY 30 capsule 0  . Cholecalciferol 2000 UNITS CAPS Take 2,000 Units by mouth daily.     . diclofenac sodium (VOLTAREN) 1 % GEL Apply 1 application topically 2 (two) times daily as needed (for neck pain).     . furosemide (LASIX) 20 MG tablet TAKE 1 TABLET BY MOUTH EVERY DAY AS NEEDED FOR FLUID 30 tablet 5  . gefitinib (IRESSA) 250 MG tablet Take 1 tablet (250 mg total) by mouth daily. 30 tablet 5  . LUTEIN PO Take 1 capsule by mouth daily. Reported on 10/27/2015    . magnesium 30 MG tablet Take 30 mg by mouth every morning.    . Multiple Vitamins-Minerals (AIRBORNE) CHEW Chew 1 tablet by mouth daily.    . Multiple Vitamins-Minerals (HAIR/SKIN/NAILS) TABS Take 1 tablet by mouth daily.    . Multiple Vitamins-Minerals (PRESERVISION AREDS 2 PO) Take 1 tablet by mouth 2 (two) times daily with a meal.    . NON FORMULARY Eye In jection every 5-6 weeks.    . NON FORMULARY Place 1 drop into both eyes. Thera Tears    .  Omega-3 Fatty Acids (FISH OIL) 1200 MG CAPS Take 1,200 mg by mouth daily.    Marland Kitchen warfarin (COUMADIN) 5 MG tablet TAKE ONE TABLET BY MOUTH DAILY EXCEPT ONE AND ONE-HALF TABLETS ON TUESDAYS AND FRIDAYS 40 tablet 6  . HYDROmorphone (DILAUDID) 4 MG tablet Take 1 tablet (4 mg total) by mouth every 6 (six) hours as needed for severe pain. (Patient not taking: Reported on 05/14/2017) 20 tablet 0  . ondansetron (ZOFRAN ODT) 4 MG disintegrating tablet Take 1 tablet (4 mg total) by mouth every 8 (eight) hours as needed. (Patient not taking: Reported on 06/18/2017) 10 tablet 1  . potassium chloride  (K-DUR,KLOR-CON) 10 MEQ tablet Take 1 tablet (10 mEq total) by mouth daily. (Patient not taking: Reported on 01/01/2018) 30 tablet 6   No current facility-administered medications for this visit.     SURGICAL HISTORY:  Past Surgical History:  Procedure Laterality Date  . Bladder tack  1996  . CARDIOVERSION  11/02/09   x 2  . CATARACT EXTRACTION  2010/2011  . COLONOSCOPY  3 YRS AGO  . COLONOSCOPY  03/06/2011   Procedure: COLONOSCOPY;  Surgeon: Rogene Houston, MD;  [facility];  Service: Endoscopy;  Laterality: N/A;  7:30 am  . ESOPHAGOGASTRODUODENOSCOPY    . FLEXIBLE SIGMOIDOSCOPY N/A 10/04/2012   Procedure: FLEXIBLE SIGMOIDOSCOPY;  Surgeon: Rogene Houston, MD;  Location: [facility];  Service: Endoscopy;  Laterality: N/A;  11:00-moved to 1015 Ann to notify pt  . THYROID SURGERY  1961  . TONSILLECTOMY    . VIDEO BRONCHOSCOPY  05/31/2011   Procedure: VIDEO BRONCHOSCOPY;  Surgeon: Grace Isaac, MD;  Location[facility] OR;  Service: Thoracic;  Laterality: N/A;    REVIEW OF SYSTEMS:   Review of Systems  Constitutional: Negative for appetite change, chills, fatigue, fever and unexpected weight change.  HENT:   Negative for mouth sores, nosebleeds, sore throat and trouble swallowing.   Eyes: Negative for eye problems and icterus.  Respiratory: Negative for cough, hemoptysis, shortness of breath and wheezing.   Cardiovascular: Negative for chest pain and leg swelling.  Gastrointestinal: Negative for abdominal pain, constipation, diarrhea, nausea and vomiting.  Genitourinary: Negative for bladder incontinence, difficulty urinating, dysuria, frequency and hematuria.   Musculoskeletal: Negative for back pain, gait problem, neck pain and neck stiffness.  Skin: Negative for itching and rash.  Neurological: Negative for dizziness, extremity weakness, gait problem, headaches, light-headedness and seizures.  Hematological: Negative for adenopathy. Does not bruise/bleed easily.   Psychiatric/Behavioral: Negative for confusion, depression and sleep disturbance. The patient is not nervous/anxious.     PHYSICAL EXAMINATION:  Blood pressure 130/88, pulse 76, temperature 97.7 F (36.5 C), temperature source Oral, resp. rate 18, height 5' 3"  (1.6 m), weight 108 lb 6.4 oz (49.2 kg), SpO2 97 %.  ECOG PERFORMANCE STATUS: 1 - Symptomatic but completely ambulatory  Physical Exam  Constitutional: Oriented to person, place, and time and well-developed, well-nourished, and in no distress. No distress.  HENT:  Head: Normocephalic and atraumatic.  Mouth/Throat: Oropharynx is clear and moist. No oropharyngeal exudate.  Eyes: Conjunctivae are normal. Right eye exhibits no discharge. Left eye exhibits no discharge. No scleral icterus.  Neck: Normal range of motion. Neck supple.  Cardiovascular: Normal rate, regular rhythm, normal heart sounds and intact distal pulses.   Pulmonary/Chest: Effort normal and breath sounds normal. No respiratory distress. No wheezes. No rales.  Abdominal: Soft. Bowel sounds are normal. Exhibits no distension and no mass. There is no tenderness.  Musculoskeletal:  Normal range of motion. Exhibits no edema.  Lymphadenopathy:    No cervical adenopathy.  Neurological: Alert and oriented to person, place, and time. Exhibits normal muscle tone. Gait normal. Coordination normal.  Skin: Skin is warm and dry. No rash noted. Not diaphoretic. No erythema. No pallor.  Psychiatric: Mood, memory and judgment normal.  Vitals reviewed.  LABORATORY DATA: Lab Results  Component Value Date   WBC 4.0 01/01/2018   HGB 14.1 01/01/2018   HCT 42.9 01/01/2018   MCV 92.8 01/01/2018   PLT 222 01/01/2018      Chemistry      Component Value Date/Time   NA 139 01/01/2018 1028   NA 141 05/14/2017 1304   K 4.9 01/01/2018 1028   K 4.3 05/14/2017 1304   CL 103 01/01/2018 1028   CO2 29 01/01/2018 1028   CO2 30 (H) 05/14/2017 1304   BUN 15 01/01/2018 1028   BUN 19.0  05/14/2017 1304   CREATININE 0.78 01/01/2018 1028   CREATININE 0.8 05/14/2017 1304      Component Value Date/Time   CALCIUM 9.1 01/01/2018 1028   CALCIUM 9.1 05/14/2017 1304   ALKPHOS 84 01/01/2018 1028   ALKPHOS 86 05/14/2017 1304   AST 27 01/01/2018 1028   AST 25 05/14/2017 1304   ALT 14 01/01/2018 1028   ALT 15 05/14/2017 1304   BILITOT 0.7 01/01/2018 1028   BILITOT 0.57 05/14/2017 1304       RADIOGRAPHIC STUDIES:  No results found.   ASSESSMENT/PLAN:  Malignant neoplasm of right upper lobe of lung [facility]) This is a very pleasant 84 year old white female with recurrent non-small cell lung cancer, adenocarcinoma with positive EGFR mutation. The patient has been on treatment with Iressa 250 mg p.o. daily for the last 31 months. The patient is feeling fine today with no concerning complaints and she continues to tolerate her treatment well. I recommended for her to continue her current treatment with Iressa. The patient will have a restaging CT scan of the chest prior to her next visit.  She will follow-up in 6 weeks to discuss her restaging CT scan results.  I will see her back for follow-up visit in 6 weeks for evaluation with repeat blood work. She was advised to call immediately if she has any concerning symptoms in the interval. The patient voices understanding of current disease status and treatment options and is in agreement with the current care plan. All questions were answered. The patient knows to call the clinic with any problems, questions or concerns. We can certainly see the patient much sooner if necessary.   Orders Placed This Encounter  Procedures  . CT CHEST W CONTRAST    Standing Status:   Future    Standing Expiration Date:   01/02/2019    Order Specific Question:   If indicated for the ordered procedure, I authorize the administration of contrast media per Radiology protocol    Answer:   Yes    Order Specific Question:   Preferred imaging location?     Answer:   [facility]    Order Specific Question:   Radiology Contrast Protocol - do NOT remove file path    Answer:   [email or web address]    Order Specific Question:   ** REASON FOR EXAM (FREE TEXT)    Answer:   Lung cancer. Restaging.  Marland Kitchen CBC with Differential (Cancer Center Only)    Standing Status:   Future    Standing Expiration Date:   01/02/2019  .  CMP ([place] only)    Standing Status:   Future    Standing Expiration Date:   01/02/2019     Mikey Bussing, DNP, AGPCNP-BC, AOCNP 01/01/18

## 2018-01-03 ENCOUNTER — Telehealth: Payer: Self-pay | Admitting: Internal Medicine

## 2018-01-03 NOTE — Telephone Encounter (Signed)
Spoke to PT regarding upcoming sept appt updates per 8/22 sch message

## 2018-01-07 ENCOUNTER — Other Ambulatory Visit: Payer: Self-pay | Admitting: Cardiology

## 2018-01-21 DIAGNOSIS — Z23 Encounter for immunization: Secondary | ICD-10-CM | POA: Diagnosis not present

## 2018-01-22 NOTE — Progress Notes (Signed)
  Cardiology Office Note  Date: 01/23/2018   ID: Issabelle Y Carlson, DOB 04/30/1930, MRN [id number]  PCP: Daniel, Terry G, MD  Primary Cardiologist: Samuel McDowell, MD   Chief Complaint  Patient presents with  . Atrial Fibrillation    History of Present Illness: Natalie Carlson is an 84 y.o. female last seen in October 2018.  She presents for a routine visit.  Reports no progressive palpitations or chest pain.  Still remains functional with ADLs including yard work.  She continues on Coumadin with follow-up in the anticoagulation clinic.  Last INR was 2.6.  She reports no bleeding problems.  I personally reviewed her ECG today which shows rate controlled atrial fibrillation.  I reviewed her medications.  Cardiac regimen includes Cartia XT 180 mg daily and Coumadin.  Past Medical History:  Diagnosis Date  . Arthritis   . Atrial fibrillation (HCC)   . Cataract of both eyes   . Chronic back pain   . Chronic diarrhea   . Colitis   . Hemorrhoids   . History of colon polyps   . History of migraines   . Hyperlipidemia   . Hypothyroidism   . Macular degeneration, dry   . Malignant neoplasm of right upper lobe of lung (HCC)    Stage 1A  EGFR positive  Deletion in exon 19.  ALK-negative  Right Upper Lobe Lung Mass  SUV 2.9 on PET Status post VATS and right upper lobe lobectomy 05/31/2011  . Mucus in stool   . Nephrolithiasis    1994 - passed on its on    Past Surgical History:  Procedure Laterality Date  . Bladder tack  1996  . CARDIOVERSION  11/02/09   x 2  . CATARACT EXTRACTION  2010/2011  . COLONOSCOPY  3 YRS AGO  . COLONOSCOPY  03/06/2011   Procedure: COLONOSCOPY;  Surgeon: Najeeb U Rehman, MD;  [facility];  Service: Endoscopy;  Laterality: N/A;  7:30 am  . ESOPHAGOGASTRODUODENOSCOPY    . FLEXIBLE SIGMOIDOSCOPY N/A 10/04/2012   Procedure: FLEXIBLE SIGMOIDOSCOPY;  Surgeon: Najeeb U Rehman, MD;  [facility]: [facility];  Service: Endoscopy;  Laterality: N/A;   11:00-moved to 1015 Ann to notify pt  . THYROID SURGERY  1961  . TONSILLECTOMY    . VIDEO BRONCHOSCOPY  05/31/2011   Procedure: VIDEO BRONCHOSCOPY;  Surgeon: Edward B Gerhardt, MD;  Location: [facility] OR;  Service: Thoracic;  Laterality: N/A;    Current Outpatient Medications  Medication Sig Dispense Refill  . APRISO 0.375 g 24 hr capsule TAKE 2 CAPSULE BY MOUTH TWICE DAILY 120 capsule 11  . Calcium Carbonate (CALCIUM 600 PO) Take 1 tablet by mouth 2 (two) times daily.    . CARTIA XT 180 MG 24 hr capsule TAKE ONE CAPSULE BY MOUTH DAILY 90 capsule 0  . Cholecalciferol 2000 UNITS CAPS Take 2,000 Units by mouth daily.     . diclofenac sodium (VOLTAREN) 1 % GEL Apply 1 application topically 2 (two) times daily as needed (for neck pain).     . furosemide (LASIX) 20 MG tablet TAKE 1 TABLET BY MOUTH EVERY DAY AS NEEDED FOR FLUID 30 tablet 5  . gefitinib (IRESSA) 250 MG tablet Take 1 tablet (250 mg total) by mouth daily. 30 tablet 5  . HYDROmorphone (DILAUDID) 4 MG tablet Take 1 tablet (4 mg total) by mouth every 6 (six) hours as needed for severe pain. 20 tablet 0  . LUTEIN PO Take 1 capsule by mouth daily. Reported   on 10/27/2015    . magnesium 30 MG tablet Take 30 mg by mouth every morning.    . Multiple Vitamins-Minerals (AIRBORNE) CHEW Chew 1 tablet by mouth daily.    . Multiple Vitamins-Minerals (HAIR/SKIN/NAILS) TABS Take 1 tablet by mouth daily.    . Multiple Vitamins-Minerals (PRESERVISION AREDS 2 PO) Take 1 tablet by mouth 2 (two) times daily with a meal.    . NON FORMULARY Eye In jection every 5-6 weeks.    . NON FORMULARY Place 1 drop into both eyes. Thera Tears    . Omega-3 Fatty Acids (FISH OIL) 1200 MG CAPS Take 1,200 mg by mouth daily.    . ondansetron (ZOFRAN ODT) 4 MG disintegrating tablet Take 1 tablet (4 mg total) by mouth every 8 (eight) hours as needed. 10 tablet 1  . potassium chloride (K-DUR,KLOR-CON) 10 MEQ tablet Take 1 tablet (10 mEq total) by mouth daily. 30 tablet 6  . warfarin  (COUMADIN) 5 MG tablet TAKE ONE TABLET BY MOUTH DAILY EXCEPT ONE AND ONE-HALF TABLETS ON TUESDAYS AND FRIDAYS 40 tablet 6   No current facility-administered medications for this visit.    Allergies:  Doxycycline; Entocort ec [budesonide]; Neurontin [gabapentin]; Vicodin [hydrocodone-acetaminophen]; Adhesive [tape]; Amoxicillin; Chocolate; Codeine; Levaquin [levofloxacin in d5w]; Morphine and related; Naprosyn [naproxen]; Penicillins; Prednisone; Tramadol; and Uncoded nonscreenable allergen   Social History: The patient  reports that she has never smoked. She has never used smokeless tobacco. She reports that she does not drink alcohol or use drugs.   ROS:  Please see the history of present illness. Otherwise, complete review of systems is positive for none.  All other systems are reviewed and negative.   Physical Exam: VS:  BP 112/64   Pulse 85   Ht 5' 3" (1.6 m)   Wt 109 lb (49.4 kg)   SpO2 97%   BMI 19.31 kg/m , BMI Body mass index is 19.31 kg/m.  Wt Readings from Last 3 Encounters:  01/23/18 109 lb (49.4 kg)  01/01/18 108 lb 6.4 oz (49.2 kg)  11/20/17 109 lb 4.8 oz (49.6 kg)    General: Elderly woman, appears comfortable at rest. HEENT: Conjunctiva and lids normal, oropharynx clear. Neck: Supple, no elevated JVP or carotid bruits, no thyromegaly. Lungs: Clear to auscultation, nonlabored breathing at rest. Cardiac: Irregularly irregular, no S3 or significant systolic murmur. Abdomen: Soft, nontender, bowel sounds present. Extremities: Trace ankle edema, distal pulses 2+.  ECG: I personally reviewed the tracing from 08/10/2015 which showed rate-controlled atrial fibrillation.  Recent Labwork: 01/01/2018: ALT 14; AST 27; BUN 15; Creatinine 0.78; Hemoglobin 14.1; Platelet Count 222; Potassium 4.9; Sodium 139   Other Studies Reviewed Today:  Echocardiogram 02/04/2014: Study Conclusions  - Left ventricle: The cavity size was normal. Wall thickness was normal. Systolic function  was normal. The estimated ejection fraction was in the range of 60% to 65%. Wall motion was normal; there were no regional wall motion abnormalities. The study is not technically sufficient to allow evaluation of LV diastolic function. - Aortic valve: Mildly calcified annulus. Trileaflet. There was trivial regurgitation. - Mitral valve: Calcified annulus. There was mild regurgitation. - Right ventricle: The cavity size was mildly to moderately dilated. Systolic function was normal. - Right atrium: The atrium was mildly dilated. Central venous pressure (est): 3 mm Hg. - Atrial septum: No defect or patent foramen ovale was identified. - Tricuspid valve: There was mild-moderate regurgitation. - Pulmonary arteries: PA peak pressure: 34 mm Hg (S). - Pericardium, extracardiac: There was no pericardial  effusion.  Impressions:  - Normal LV wall thickness and chamber size with LVEF 60-65%. Indeterminate diastolic function in the setting of coarse atrial fibrillation/flutter. MAC with mild mitral regurgitation. Mild to moderate RV enlargement with normal contraction. Mild right atrial enlargement. Mild to moderate tricuspid regurgitation with PASP 34 mmHg.  Assessment and Plan:  1.  Permanent atrial fibrillation.  Continue strategy of heart rate control and anticoagulation, no changes made to present regimen.  Keep follow-up in the anticoagulation clinic.  2.  Mixed hyperlipidemia, continues to follow with Dr. Daniel.  3.  Asymptomatic, mild internal carotid artery disease.  She has statin intolerance.  Not on aspirin given use of Coumadin.  Current medicines were reviewed with the patient today.   Orders Placed This Encounter  Procedures  . EKG 12-Lead    Disposition: Follow-up in 1 year.  Signed, Samuel G. McDowell, MD, FACC 01/23/2018 2:17 PM    [facility] [place] [address], [place], [place] [address] Phone: [phone];  Fax: [phone] 

## 2018-01-23 ENCOUNTER — Other Ambulatory Visit: Payer: Self-pay | Admitting: Cardiology

## 2018-01-23 ENCOUNTER — Encounter: Payer: Self-pay | Admitting: Cardiology

## 2018-01-23 ENCOUNTER — Ambulatory Visit (INDEPENDENT_AMBULATORY_CARE_PROVIDER_SITE_OTHER): Payer: Medicare Other | Admitting: Cardiology

## 2018-01-23 VITALS — BP 112/64 | HR 85 | Ht 63.0 in | Wt 109.0 lb

## 2018-01-23 DIAGNOSIS — I4821 Permanent atrial fibrillation: Secondary | ICD-10-CM

## 2018-01-23 DIAGNOSIS — I6523 Occlusion and stenosis of bilateral carotid arteries: Secondary | ICD-10-CM

## 2018-01-23 DIAGNOSIS — I482 Chronic atrial fibrillation: Secondary | ICD-10-CM

## 2018-01-23 DIAGNOSIS — E782 Mixed hyperlipidemia: Secondary | ICD-10-CM

## 2018-01-23 NOTE — Patient Instructions (Addendum)
Medication Instructions:   Your physician recommends that you continue on your current medications as directed. Please refer to the Current Medication list given to you today.  Labwork:  NONE  Testing/Procedures:  NONE  Follow-Up:  Your physician recommends that you schedule a follow-up appointment in: 1 year. You will receive a reminder letter in the mail in about 10 months reminding you to call and schedule your appointment. If you don't receive this letter, please contact our office.  Any Other Special Instructions Will Be Listed Below (If Applicable).  If you need a refill on your cardiac medications before your next appointment, please call your pharmacy.

## 2018-02-04 ENCOUNTER — Other Ambulatory Visit: Payer: Self-pay | Admitting: *Deleted

## 2018-02-04 MED ORDER — DILTIAZEM HCL ER COATED BEADS 180 MG PO CP24: 180.0000 mg | ORAL_CAPSULE | Freq: Every day | ORAL | 2 refills | Status: DC

## 2018-02-05 ENCOUNTER — Telehealth: Payer: Self-pay

## 2018-02-05 NOTE — Telephone Encounter (Signed)
Oral Oncology Patient Advocate Encounter  [facility] have updated their system and are now having major issues. The patients and [place] here at the office are sitting on the phone between 1-3 hours to talk to someone.   When I got them on the phone today I requested refills for all of our [facility] patients and they will expedite the refill process and overnight Ms. Wootens Iressa.  I spoke to the patient and she verbalized understanding and appreciation.  [place] Patient [place] Phone [phone] Fax [phone]

## 2018-02-06 ENCOUNTER — Ambulatory Visit (HOSPITAL_COMMUNITY)
Admission: RE | Admit: 2018-02-06 | Discharge: 2018-02-06 | Disposition: A | Discharge status: Home / Self Care | Payer: Medicare Other | Source: Ambulatory Visit | Attending: Oncology | Admitting: Oncology

## 2018-02-06 DIAGNOSIS — I700 Atherosclerosis of aorta: Secondary | ICD-10-CM | POA: Insufficient documentation

## 2018-02-06 DIAGNOSIS — C3411 Malignant neoplasm of upper lobe, right bronchus or lung: Secondary | ICD-10-CM | POA: Diagnosis not present

## 2018-02-06 DIAGNOSIS — C3490 Malignant neoplasm of unspecified part of unspecified bronchus or lung: Secondary | ICD-10-CM | POA: Diagnosis not present

## 2018-02-06 MED ORDER — IOHEXOL 300 MG/ML  SOLN
75.0000 mL | Freq: Once | INTRAMUSCULAR | Status: AC | PRN
  Administered 2018-02-06: 75 mL via INTRAVENOUS

## 2018-02-11 ENCOUNTER — Telehealth: Payer: Self-pay | Admitting: Internal Medicine

## 2018-02-11 ENCOUNTER — Inpatient Hospital Stay: Payer: Medicare Other

## 2018-02-11 ENCOUNTER — Other Ambulatory Visit: Payer: Medicare Other

## 2018-02-11 ENCOUNTER — Inpatient Hospital Stay: Payer: Medicare Other | Attending: Internal Medicine | Admitting: Internal Medicine

## 2018-02-11 ENCOUNTER — Encounter: Payer: Self-pay | Admitting: Internal Medicine

## 2018-02-11 VITALS — BP 139/81 | HR 77 | Temp 98.6°F | Resp 17 | Ht 63.0 in | Wt 108.7 lb

## 2018-02-11 DIAGNOSIS — Z7901 Long term (current) use of anticoagulants: Secondary | ICD-10-CM | POA: Diagnosis not present

## 2018-02-11 DIAGNOSIS — I4891 Unspecified atrial fibrillation: Secondary | ICD-10-CM | POA: Insufficient documentation

## 2018-02-11 DIAGNOSIS — C3411 Malignant neoplasm of upper lobe, right bronchus or lung: Secondary | ICD-10-CM

## 2018-02-11 DIAGNOSIS — Z79899 Other long term (current) drug therapy: Secondary | ICD-10-CM | POA: Insufficient documentation

## 2018-02-11 DIAGNOSIS — Z5181 Encounter for therapeutic drug level monitoring: Secondary | ICD-10-CM

## 2018-02-11 LAB — CBC WITH DIFFERENTIAL (CANCER CENTER ONLY)
Basophils Absolute: 0 10*3/uL (ref 0.0–0.1)
Basophils Relative: 1 %
EOS PCT: 4 %
Eosinophils Absolute: 0.2 10*3/uL (ref 0.0–0.5)
HCT: 43.1 % (ref 34.8–46.6)
Hemoglobin: 14.2 g/dL (ref 11.6–15.9)
LYMPHS ABS: 1.1 10*3/uL (ref 0.9–3.3)
LYMPHS PCT: 25 %
MCH: 30.5 pg (ref 25.1–34.0)
MCHC: 33 g/dL (ref 31.5–36.0)
MCV: 92.5 fL (ref 79.5–101.0)
MONO ABS: 0.7 10*3/uL (ref 0.1–0.9)
MONOS PCT: 16 %
Neutro Abs: 2.4 10*3/uL (ref 1.5–6.5)
Neutrophils Relative %: 54 %
Platelet Count: 213 10*3/uL (ref 145–400)
RBC: 4.66 MIL/uL (ref 3.70–5.45)
RDW: 13.8 % (ref 11.2–14.5)
WBC Count: 4.3 10*3/uL (ref 3.9–10.3)

## 2018-02-11 LAB — CMP (CANCER CENTER ONLY)
ALK PHOS: 81 U/L (ref 38–126)
ALT: 15 U/L (ref 0–44)
AST: 26 U/L (ref 15–41)
Albumin: 3.7 g/dL (ref 3.5–5.0)
Anion gap: 6 (ref 5–15)
BUN: 17 mg/dL (ref 8–23)
CALCIUM: 9.1 mg/dL (ref 8.9–10.3)
CO2: 29 mmol/L (ref 22–32)
CREATININE: 0.78 mg/dL (ref 0.44–1.00)
Chloride: 105 mmol/L (ref 98–111)
GFR, Estimated: >60 mL/min (ref >60)
Glucose, Bld: 98 mg/dL (ref 70–99)
Potassium: 4.3 mmol/L (ref 3.5–5.1)
Sodium: 140 mmol/L (ref 135–145)
Total Bilirubin: 0.6 mg/dL (ref 0.3–1.2)
Total Protein: 7.3 g/dL (ref 6.5–8.1)

## 2018-02-11 NOTE — Telephone Encounter (Signed)
Appts scheduled avs/calendar printed per 9/30 los

## 2018-02-11 NOTE — Progress Notes (Signed)
[facility] [phone]   [phone]  OFFICE PROGRESS NOTE  [name], MD [place] [address]  DIAGNOSIS: Recurrent non-small cell lung cancer presented with bilateral lung nodules in October 2016 initially diagnosed as a stage IA adenocarcinoma with positive EGFR mutation with deletion in exon 21 in December 2012.  PRIOR THERAPY: Status post bronchoscopy with right VATS and right upper lobectomy with lymph node dissection under the care of Dr. Servando Snare on 05/31/2011.  CURRENT THERAPY: Iressa 250 mg by mouth daily started 04/17/2015, status post 28 months of treatment.  INTERVAL HISTORY: Natalie Carlson 84 y.o. female returns to the clinic today for follow-up visit.  The patient is feeling fine today with no concerning complaints.  She continues to tolerate her treatment with Iressa fairly well.  She denied having any chest pain, shortness of breath, cough or hemoptysis.  She denied having any fever or chills.  She has no nausea, vomiting, diarrhea or constipation.  She denied having any significant skin rash.  The patient had repeat CT scan of the chest performed recently and she is here for evaluation and discussion of her risk her results.   MEDICAL HISTORY: Past Medical History:  Diagnosis Date  . Arthritis   . Atrial fibrillation ([place])   . Cataract of both eyes   . Chronic back pain   . Chronic diarrhea   . Colitis   . Hemorrhoids   . History of colon polyps   . History of migraines   . Hyperlipidemia   . Hypothyroidism   . Macular degeneration, dry   . Malignant neoplasm of right upper lobe of lung (HCC)    Stage 1A  EGFR positive  Deletion in exon 19.  ALK-negative  Right Upper Lobe Lung Mass  SUV 2.9 on PET Status post VATS and right upper lobe lobectomy 05/31/2011  . Mucus in stool   . Nephrolithiasis    1994 - passed on its on    ALLERGIES:  is allergic to doxycycline; entocort ec [budesonide]; neurontin  [gabapentin]; vicodin [hydrocodone-acetaminophen]; adhesive [tape]; amoxicillin; chocolate; codeine; levaquin [levofloxacin in d5w]; morphine and related; naprosyn [naproxen]; penicillins; prednisone; tramadol; and uncoded nonscreenable allergen.  MEDICATIONS:  Current Outpatient Medications  Medication Sig Dispense Refill  . APRISO 0.375 g 24 hr capsule TAKE 2 CAPSULE BY MOUTH TWICE DAILY 120 capsule 11  . Calcium Carbonate (CALCIUM 600 PO) Take 1 tablet by mouth 2 (two) times daily.    . Cholecalciferol 2000 UNITS CAPS Take 2,000 Units by mouth daily.     . diclofenac sodium (VOLTAREN) 1 % GEL Apply 1 application topically 2 (two) times daily as needed (for neck pain).     Marland Kitchen diltiazem (CARTIA XT) 180 MG 24 hr capsule Take 1 capsule (180 mg total) by mouth daily. 90 capsule 2  . furosemide (LASIX) 20 MG tablet TAKE 1 TABLET BY MOUTH EVERY DAY AS NEEDED FOR FLUID 30 tablet 5  . gefitinib (IRESSA) 250 MG tablet Take 1 tablet (250 mg total) by mouth daily. 30 tablet 5  . HYDROmorphone (DILAUDID) 4 MG tablet Take 1 tablet (4 mg total) by mouth every 6 (six) hours as needed for severe pain. 20 tablet 0  . LUTEIN PO Take 1 capsule by mouth daily. Reported on 10/27/2015    . magnesium 30 MG tablet Take 30 mg by mouth every morning.    . Multiple Vitamins-Minerals (AIRBORNE) CHEW Chew 1 tablet by mouth daily.    Marland Kitchen  Multiple Vitamins-Minerals (HAIR/SKIN/NAILS) TABS Take 1 tablet by mouth daily.    . Multiple Vitamins-Minerals (PRESERVISION AREDS 2 PO) Take 1 tablet by mouth 2 (two) times daily with a meal.    . NON FORMULARY Eye In jection every 5-6 weeks.    . NON FORMULARY Place 1 drop into both eyes. Thera Tears    . Omega-3 Fatty Acids (FISH OIL) 1200 MG CAPS Take 1,200 mg by mouth daily.    . ondansetron (ZOFRAN ODT) 4 MG disintegrating tablet Take 1 tablet (4 mg total) by mouth every 8 (eight) hours as needed. 10 tablet 1  . potassium chloride (K-DUR,KLOR-CON) 10 MEQ tablet Take 1 tablet (10 mEq  total) by mouth daily. 30 tablet 6  . warfarin (COUMADIN) 5 MG tablet TAKE ONE TABLET BY MOUTH DAILY EXCEPT ONE AND ONE-HALF TABLETS ON TUESDAYS AND FRIDAYS 40 tablet 6   No current facility-administered medications for this visit.     SURGICAL HISTORY:  Past Surgical History:  Procedure Laterality Date  . Bladder tack  1996  . CARDIOVERSION  11/02/09   x 2  . CATARACT EXTRACTION  2010/2011  . COLONOSCOPY  3 YRS AGO  . COLONOSCOPY  03/06/2011   Procedure: COLONOSCOPY;  Surgeon: Rogene Houston, MD;  Location: [facility];  Service: Endoscopy;  Laterality: N/A;  7:30 am  . ESOPHAGOGASTRODUODENOSCOPY    . FLEXIBLE SIGMOIDOSCOPY N/A 10/04/2012   Procedure: FLEXIBLE SIGMOIDOSCOPY;  Surgeon: Rogene Houston, MD;  Location: [facility];  Service: Endoscopy;  Laterality: N/A;  11:00-moved to 1015 Ann to notify pt  . THYROID SURGERY  1961  . TONSILLECTOMY    . VIDEO BRONCHOSCOPY  05/31/2011   Procedure: VIDEO BRONCHOSCOPY;  Surgeon: Grace Isaac, MD;  Location: [facility] OR;  Service: Thoracic;  Laterality: N/A;    REVIEW OF SYSTEMS:  Constitutional: negative Eyes: negative Ears, nose, mouth, throat, and face: negative Respiratory: negative Cardiovascular: negative Gastrointestinal: negative Genitourinary:negative Integument/breast: negative Hematologic/lymphatic: negative Musculoskeletal:negative Neurological: negative Behavioral/Psych: negative Endocrine: negative Allergic/Immunologic: negative   PHYSICAL EXAMINATION: General appearance: alert, cooperative and no distress Head: Normocephalic, without obvious abnormality, atraumatic Neck: no adenopathy, no JVD, supple, symmetrical, trachea midline and thyroid not enlarged, symmetric, no tenderness/mass/nodules Lymph nodes: Cervical, supraclavicular, and axillary nodes normal. Resp: clear to auscultation bilaterally Back: symmetric, no curvature. ROM normal. No CVA tenderness. Cardio: regular rate and rhythm, S1, S2 normal, no  murmur, click, rub or gallop GI: soft, non-tender; bowel sounds normal; no masses,  no organomegaly Extremities: extremities normal, atraumatic, no cyanosis or edema Neurologic: Alert and oriented X 3, normal strength and tone. Normal symmetric reflexes. Normal coordination and gait  ECOG PERFORMANCE STATUS: 0 - Asymptomatic  Blood pressure 139/81, pulse 77, temperature 98.6 F (37 C), temperature source Oral, resp. rate 17, height 5' 3"  (1.6 m), weight 108 lb 11.2 oz (49.3 kg), SpO2 98 %.  LABORATORY DATA: Lab Results  Component Value Date   WBC 4.3 02/11/2018   HGB 14.2 02/11/2018   HCT 43.1 02/11/2018   MCV 92.5 02/11/2018   PLT 213 02/11/2018      Chemistry      Component Value Date/Time   NA 139 01/01/2018 1028   NA 141 05/14/2017 1304   K 4.9 01/01/2018 1028   K 4.3 05/14/2017 1304   CL 103 01/01/2018 1028   CO2 29 01/01/2018 1028   CO2 30 (H) 05/14/2017 1304   BUN 15 01/01/2018 1028   BUN 19.0 05/14/2017 1304   CREATININE 0.78 01/01/2018  1028   CREATININE 0.8 05/14/2017 1304      Component Value Date/Time   CALCIUM 9.1 01/01/2018 1028   CALCIUM 9.1 05/14/2017 1304   ALKPHOS 84 01/01/2018 1028   ALKPHOS 86 05/14/2017 1304   AST 27 01/01/2018 1028   AST 25 05/14/2017 1304   ALT 14 01/01/2018 1028   ALT 15 05/14/2017 1304   BILITOT 0.7 01/01/2018 1028   BILITOT 0.57 05/14/2017 1304       RADIOGRAPHIC STUDIES: Ct Chest W Contrast  Result Date: 02/07/2018 CLINICAL DATA:  Followup lung cancer EXAM: CT CHEST WITH CONTRAST TECHNIQUE: Multidetector CT imaging of the chest was performed during intravenous contrast administration. CONTRAST:  23m OMNIPAQUE IOHEXOL 300 MG/ML  SOLN COMPARISON:  09/12/2017 FINDINGS: Cardiovascular: Ascending thoracic aorta measures 3.8 cm in diameter. Heart size is moderately enlarged. Aortic atherosclerosis. No pericardial effusion. Mediastinum/Nodes: Thyroid nodules are again noted. The largest is in the right lobe measuring 1.8 cm,  image 20/2. The trachea appears patent. Unremarkable appearance of the esophagus. No enlarged supraclavicular, axillary, mediastinal or hilar lymph nodes. Lungs/Pleura: No pleural effusion identified. No airspace consolidation, atelectasis or pneumothorax. Bilateral, basilar predominant interstitial thickening is again identified. 6 mm nodule within the left upper lobe is stable, image 13/4. 3 mm left upper lobe lung nodule is unchanged, image 48/4. Nodularity along the unchanged. This includes a 4 mm perifissural nodule in the right upper lobe, image 40/6. Upper Abdomen: No acute abnormality. Musculoskeletal: Scoliosis deformity noted. No aggressive lytic or sclerotic bone lesions identified. IMPRESSION: 1. Stable exam.  No findings to suggest lung cancer recurrence. 2. Small left lung nodules and perifissural nodularity in the right lung are unchanged from previous exam. 3.  Aortic Atherosclerosis (ICD10-I70.0). Electronically Signed   By: TKerby MoorsM.D.   On: 02/07/2018 09:06    ASSESSMENT AND PLAN:  This is a very pleasant 84years old white female with recurrent non-small cell lung cancer, adenocarcinoma with positive EGFR mutation. The patient has been on treatment with Iressa 250 mg p.o. daily for the last 28 months. She has been tolerating her treatment with Iressa fairly well. The patient had repeat CT scan of the chest performed recently.  I personally and independently reviewed the scan and discussed the results with the patient today.  Her scan showed no concerning findings for disease progression. I recommended for the patient to continue on treatment with Iressa 250 mg p.o. daily. I will see her back for follow-up visit in 6 weeks for evaluation with repeat blood work. She was advised to call immediately if she has any concerning symptoms in the interval. The patient voices understanding of current disease status and treatment options and is in agreement with the current care plan. All  questions were answered. The patient knows to call the clinic with any problems, questions or concerns. We can certainly see the patient much sooner if necessary. I spent 15 minutes counseling the patient face to face. The total time spent in the appointment was 25 minutes.   Disclaimer: This note was dictated with voice recognition software. Similar sounding words can inadvertently be transcribed and may not be corrected upon review.

## 2018-02-12 ENCOUNTER — Ambulatory Visit (INDEPENDENT_AMBULATORY_CARE_PROVIDER_SITE_OTHER): Payer: Medicare Other | Admitting: *Deleted

## 2018-02-12 DIAGNOSIS — I4891 Unspecified atrial fibrillation: Secondary | ICD-10-CM | POA: Diagnosis not present

## 2018-02-12 DIAGNOSIS — Z5181 Encounter for therapeutic drug level monitoring: Secondary | ICD-10-CM | POA: Diagnosis not present

## 2018-02-12 LAB — POCT INR: INR: 2.3 (ref 2.0–3.0)

## 2018-02-12 NOTE — Patient Instructions (Signed)
Continue coumadin 1 tablet daily except 1 1/2 tablets on Tuesdays, Thursdays and Saturdays Recheck in 6 weeks

## 2018-02-18 DIAGNOSIS — H353231 Exudative age-related macular degeneration, bilateral, with active choroidal neovascularization: Secondary | ICD-10-CM | POA: Diagnosis not present

## 2018-02-18 DIAGNOSIS — H35371 Puckering of macula, right eye: Secondary | ICD-10-CM | POA: Diagnosis not present

## 2018-02-18 DIAGNOSIS — H43813 Vitreous degeneration, bilateral: Secondary | ICD-10-CM | POA: Diagnosis not present

## 2018-02-18 DIAGNOSIS — H43391 Other vitreous opacities, right eye: Secondary | ICD-10-CM | POA: Diagnosis not present

## 2018-02-19 ENCOUNTER — Ambulatory Visit (INDEPENDENT_AMBULATORY_CARE_PROVIDER_SITE_OTHER): Payer: Medicare Other | Admitting: Internal Medicine

## 2018-02-19 ENCOUNTER — Encounter (INDEPENDENT_AMBULATORY_CARE_PROVIDER_SITE_OTHER): Payer: Self-pay | Admitting: Internal Medicine

## 2018-02-19 VITALS — BP 98/68 | HR 62 | Temp 97.7°F | Resp 18 | Ht 63.0 in | Wt 109.7 lb

## 2018-02-19 DIAGNOSIS — K5010 Crohn's disease of large intestine without complications: Secondary | ICD-10-CM | POA: Diagnosis not present

## 2018-02-19 DIAGNOSIS — I6523 Occlusion and stenosis of bilateral carotid arteries: Secondary | ICD-10-CM

## 2018-02-19 MED ORDER — MESALAMINE ER 0.375 G PO CP24: ORAL_CAPSULE | ORAL | 11 refills | Status: DC

## 2018-02-19 NOTE — Patient Instructions (Signed)
Notify if you have diarrhea or rectal bleeding.

## 2018-02-19 NOTE — Progress Notes (Signed)
Presenting complaint;  Follow-up for Crohn's colitis.  Database and subjective:  Patient is 84 year old Caucasian female who presented back in fall 2012 with diarrhea and rectal discharge.  Colonoscopy revealed erosions involving cecum and ileocecal valve and she has skip lesions.  She was felt to have Crohn's colitis.  She was initially treated with short course of prednisone which she did not tolerate well followed by budesonide.  She was reevaluated in May 2014 with a flexible sigmoidoscopy and had multiple erosion involving sigmoid colon and rectum.  Biopsies once again showed changes consistent with IBD and or NSAIDs use.  However she has not been using NSAIDs. She has been maintained on oral mesalamine and as needed low-dose Imodium and has done well. She was last seen in February 2018.  She has no complaints.  She says she generally has 1 formed stool daily.  She states she has not had a diarrhea episode in over 3 months.  She also denies rectal discharge or abdominal pain.  She denies frank rectal bleeding but pain notice blood in the tissue once or twice a month.  She has hemorrhoids.  Her appetite is good and she has not lost any weight. She states she was given prescription for Dilaudid when she had kidney stone but she never used it. She has a history of lung carcinoma and remains under care of Dr. Earlie Server.  She is on maintenance therapy.  She had chest CT recently and there was no evidence of mass or other abnormalities.  Current Medications: Outpatient Encounter Medications as of 02/19/2018  Medication Sig  . APRISO 0.375 g 24 hr capsule TAKE 2 CAPSULE BY MOUTH TWICE DAILY  . Calcium Carbonate (CALCIUM 600 PO) Take 1 tablet by mouth 2 (two) times daily.  . diclofenac sodium (VOLTAREN) 1 % GEL Apply 1 application topically 2 (two) times daily as needed (for neck pain).   Marland Kitchen diltiazem (CARTIA XT) 180 MG 24 hr capsule Take 1 capsule (180 mg total) by mouth daily.  . furosemide (LASIX)  20 MG tablet TAKE 1 TABLET BY MOUTH EVERY DAY AS NEEDED FOR FLUID  . gefitinib (IRESSA) 250 MG tablet Take 1 tablet (250 mg total) by mouth daily.  Marland Kitchen HYDROmorphone (DILAUDID) 4 MG tablet Take 1 tablet (4 mg total) by mouth every 6 (six) hours as needed for severe pain.  Marland Kitchen LUTEIN PO Take 1 capsule by mouth daily. Reported on 10/27/2015  . magnesium 30 MG tablet Take 30 mg by mouth at bedtime.   . Multiple Vitamins-Minerals (AIRBORNE) CHEW Chew 1 tablet by mouth daily.  . Multiple Vitamins-Minerals (HAIR/SKIN/NAILS) TABS Take 1 tablet by mouth daily.  . Multiple Vitamins-Minerals (PRESERVISION AREDS 2 PO) Take 1 tablet by mouth 2 (two) times daily with a meal.  . NON FORMULARY Eye In jection every 8-10weeks.  . NON FORMULARY Place 1 drop into both eyes. Thera Tears  . Omega-3 Fatty Acids (FISH OIL) 1200 MG CAPS Take 1,200 mg by mouth daily.  . ondansetron (ZOFRAN ODT) 4 MG disintegrating tablet Take 1 tablet (4 mg total) by mouth every 8 (eight) hours as needed.  . potassium chloride (K-DUR,KLOR-CON) 10 MEQ tablet Take 1 tablet (10 mEq total) by mouth daily.  Marland Kitchen warfarin (COUMADIN) 5 MG tablet TAKE ONE TABLET BY MOUTH DAILY EXCEPT ONE AND ONE-HALF TABLETS ON TUESDAYS AND FRIDAYS  . [DISCONTINUED] Cholecalciferol 2000 UNITS CAPS Take 2,000 Units by mouth daily.    No facility-administered encounter medications on file as of 02/19/2018.  Objective: Blood pressure 98/68, pulse 62, temperature 97.7 F (36.5 C), temperature source Oral, resp. rate 18, height 5' 3"  (1.6 m), weight 109 lb 11.2 oz (49.8 kg). Patient is alert and in no acute distress. Conjunctiva is pink. Sclera is nonicteric Oropharyngeal mucosa is normal. No neck masses or thyromegaly noted. Cardiac exam with irregular rhythm normal S1 and S2. No murmur or gallop noted. Lungs are clear to auscultation. Abdomen is symmetrical soft and nontender with organomegaly or masses. No LE edema or clubbing noted.  Labs/studies  Results: Lab data from 02/11/2018  WBC 4.3, H&H 14.2 and 43.1 and platelet count 213 K.  Glucose 98 BUN 17 and creatinine 0.78 Serum sodium 140, potassium 4.3, chloride 105, CO2 29. Serum calcium 9.1.  Bilirubin 0.6, AP 81, AST 26, ALT 15, total protein 7.3 and albumin 3.7.    Assessment:  Low-grade Crohn's colitis.  She was diagnosed 7 years ago.  She is on oral mesalamine and appears to be in remission. She is not having any side effects with mesalamine.  Renal function is well preserved.  Transaminases are normal.    Plan:  Continue Apriso at a dose of 1500 mg/day.  She is taking 750 mg twice a day which is fine. New prescription sent to her pharmacy. Unless she has diarrhea or rectal bleeding she will return for office visit in 1 year.

## 2018-02-23 ENCOUNTER — Other Ambulatory Visit: Payer: Self-pay | Admitting: Cardiology

## 2018-02-26 ENCOUNTER — Ambulatory Visit (INDEPENDENT_AMBULATORY_CARE_PROVIDER_SITE_OTHER): Payer: Medicare Other | Admitting: Internal Medicine

## 2018-03-12 DIAGNOSIS — H353232 Exudative age-related macular degeneration, bilateral, with inactive choroidal neovascularization: Secondary | ICD-10-CM | POA: Diagnosis not present

## 2018-03-12 DIAGNOSIS — H10503 Unspecified blepharoconjunctivitis, bilateral: Secondary | ICD-10-CM | POA: Diagnosis not present

## 2018-03-12 DIAGNOSIS — H16223 Keratoconjunctivitis sicca, not specified as Sjogren's, bilateral: Secondary | ICD-10-CM | POA: Diagnosis not present

## 2018-03-12 DIAGNOSIS — H04123 Dry eye syndrome of bilateral lacrimal glands: Secondary | ICD-10-CM | POA: Diagnosis not present

## 2018-03-14 ENCOUNTER — Telehealth: Payer: Self-pay

## 2018-03-14 NOTE — Telephone Encounter (Signed)
Oral Oncology Patient Advocate Encounter  Iressa application with [employer] will expire 05/14/18. [employer] informed me that it would be best to send them as soon as possible since they have had system update problems. I have filled out a new application for the patient.   I called the patient to let her know that I needed 2018 income document and her signature. Natalie Carlson stated she would be coming in for an office visit on 03/25/18 and will bring the documents and sign at that time. She is not able to come in earlier due to transportation.  This encounter will be updated until final determination  [place] Patient [place] Phone [phone] Fax [phone]

## 2018-03-20 ENCOUNTER — Telehealth: Payer: Self-pay

## 2018-03-20 NOTE — Telephone Encounter (Signed)
Oral Oncology Patient Advocate Encounter  I was successful at securing a grant with [facility]) for $5300. This will keep the out of pocket expense for Iressa at $0. The grant information is as follows and has been shared with [facility] [facility].  Approval dates: 12/18/2017-03/18/2019 ID: [id number] Group: [id number] BIN: [id number] PCN: [id number]  I called Ms. Zell and gave her this information. I also explained to her that she would receive her Iressa from [employer] through the end of the year, then in January we will fill it at [facility] using her Fatima Sanger. She verbalized understanding and appreciation.   [place] [facility] [place] Phone [phone] Fax [phone]

## 2018-03-20 NOTE — Telephone Encounter (Signed)
Oral Oncology Patient Advocate Encounter  Natalie Carlson came in and I met her in the lobby. She signed the [employer] form and brought her 2019 tax return.  This renewal application will be put on hold for now because I was able to get her a grant. This information will be in a separate encounter.  [place] Patient [place] Phone [phone] Fax [phone]

## 2018-03-25 ENCOUNTER — Telehealth: Payer: Self-pay

## 2018-03-25 ENCOUNTER — Inpatient Hospital Stay: Payer: Medicare Other

## 2018-03-25 ENCOUNTER — Inpatient Hospital Stay: Payer: Medicare Other | Attending: Internal Medicine | Admitting: Oncology

## 2018-03-25 ENCOUNTER — Encounter: Payer: Self-pay | Admitting: Oncology

## 2018-03-25 VITALS — BP 125/70 | HR 79 | Temp 97.6°F | Resp 12 | Ht 63.0 in | Wt 111.2 lb

## 2018-03-25 DIAGNOSIS — E039 Hypothyroidism, unspecified: Secondary | ICD-10-CM | POA: Diagnosis not present

## 2018-03-25 DIAGNOSIS — I4891 Unspecified atrial fibrillation: Secondary | ICD-10-CM | POA: Insufficient documentation

## 2018-03-25 DIAGNOSIS — Z79899 Other long term (current) drug therapy: Secondary | ICD-10-CM

## 2018-03-25 DIAGNOSIS — Z7901 Long term (current) use of anticoagulants: Secondary | ICD-10-CM | POA: Diagnosis not present

## 2018-03-25 DIAGNOSIS — C3411 Malignant neoplasm of upper lobe, right bronchus or lung: Secondary | ICD-10-CM | POA: Insufficient documentation

## 2018-03-25 DIAGNOSIS — Z9221 Personal history of antineoplastic chemotherapy: Secondary | ICD-10-CM | POA: Insufficient documentation

## 2018-03-25 LAB — CBC WITH DIFFERENTIAL (CANCER CENTER ONLY)
ABS IMMATURE GRANULOCYTES: 0 10*3/uL (ref 0.00–0.07)
Basophils Absolute: 0 10*3/uL (ref 0.0–0.1)
Basophils Relative: 1 %
Eosinophils Absolute: 0.2 10*3/uL (ref 0.0–0.5)
Eosinophils Relative: 4 %
HEMATOCRIT: 43.2 % (ref 36.0–46.0)
Hemoglobin: 13.9 g/dL (ref 12.0–15.0)
Immature Granulocytes: 0 %
LYMPHS ABS: 1.3 10*3/uL (ref 0.7–4.0)
LYMPHS PCT: 28 %
MCH: 30 pg (ref 26.0–34.0)
MCHC: 32.2 g/dL (ref 30.0–36.0)
MCV: 93.1 fL (ref 80.0–100.0)
MONO ABS: 0.6 10*3/uL (ref 0.1–1.0)
MONOS PCT: 13 %
NEUTROS ABS: 2.5 10*3/uL (ref 1.7–7.7)
Neutrophils Relative %: 54 %
Platelet Count: 270 10*3/uL (ref 150–400)
RBC: 4.64 MIL/uL (ref 3.87–5.11)
RDW: 13.3 % (ref 11.5–15.5)
WBC: 4.6 10*3/uL (ref 4.0–10.5)
nRBC: 0 % (ref 0.0–0.2)

## 2018-03-25 LAB — CMP (CANCER CENTER ONLY)
ALK PHOS: 88 U/L (ref 38–126)
ALT: 15 U/L (ref 0–44)
AST: 26 U/L (ref 15–41)
Albumin: 3.6 g/dL (ref 3.5–5.0)
Anion gap: 8 (ref 5–15)
BUN: 15 mg/dL (ref 8–23)
CALCIUM: 9.3 mg/dL (ref 8.9–10.3)
CO2: 30 mmol/L (ref 22–32)
Chloride: 104 mmol/L (ref 98–111)
Creatinine: 0.78 mg/dL (ref 0.44–1.00)
GFR, Estimated: >60 mL/min (ref >60)
GLUCOSE: 128 mg/dL — AB (ref 70–99)
Potassium: 4 mmol/L (ref 3.5–5.1)
SODIUM: 142 mmol/L (ref 135–145)
Total Bilirubin: 0.6 mg/dL (ref 0.3–1.2)
Total Protein: 7.3 g/dL (ref 6.5–8.1)

## 2018-03-25 NOTE — Progress Notes (Signed)
[facility] OFFICE PROGRESS NOTE  [name], MD [place] [address]  DIAGNOSIS: Recurrent non-small cell lung cancer presented with bilateral lung nodules in October 2016 initially diagnosed as a stage IA adenocarcinoma with positive EGFR mutation with deletion in exon 19 in December 2012.  PRIOR THERAPY: Status post bronchoscopy with right VATS and right upper lobectomy with lymph node dissection under the care of Dr. Servando Snare on 05/31/2011.  CURRENT THERAPY: Iressa 250 mg by mouth daily started 04/17/2015, status post 34 months of treatment.  INTERVAL HISTORY: Natalie Carlson 84 y.o. female returns for routine follow-up visit accompanied by her friend.  The patient is feeling fine and has no specific complaints.  She continues to tolerate treatment with Iressa fairly well.  She denies fevers and chills.  Denies chest pain, shortness of breath, cough, hemoptysis.  Denies nausea, vomiting, constipation, diarrhea.  Denies skin rashes.  The patient is here for evaluation and repeat lab work.  MEDICAL HISTORY: Past Medical History:  Diagnosis Date  . Arthritis   . Atrial fibrillation ([place])   . Cataract of both eyes   . Chronic back pain   . Chronic diarrhea   . Colitis   . Hemorrhoids   . History of colon polyps   . History of migraines   . Hyperlipidemia   . Hypothyroidism   . Macular degeneration, dry   . Malignant neoplasm of right upper lobe of lung (HCC)    Stage 1A  EGFR positive  Deletion in exon 19.  ALK-negative  Right Upper Lobe Lung Mass  SUV 2.9 on PET Status post VATS and right upper lobe lobectomy 05/31/2011  . Mucus in stool   . Nephrolithiasis    1994 - passed on its on    ALLERGIES:  is allergic to doxycycline; entocort ec [budesonide]; neurontin [gabapentin]; vicodin [hydrocodone-acetaminophen]; adhesive [tape]; amoxicillin; chocolate; codeine; levaquin [levofloxacin in d5w]; morphine and related; naprosyn [naproxen]; penicillins;  prednisone; tramadol; and uncoded nonscreenable allergen.  MEDICATIONS:  Current Outpatient Medications  Medication Sig Dispense Refill  . Calcium Carbonate (CALCIUM 600 PO) Take 1 tablet by mouth 2 (two) times daily.    . diclofenac sodium (VOLTAREN) 1 % GEL Apply 1 application topically 2 (two) times daily as needed (for neck pain).     Marland Kitchen diltiazem (CARTIA XT) 180 MG 24 hr capsule Take 1 capsule (180 mg total) by mouth daily. 90 capsule 2  . furosemide (LASIX) 20 MG tablet TAKE 1 TABLET BY MOUTH EVERY DAY AS NEEDED FOR FLUID 90 tablet 1  . gefitinib (IRESSA) 250 MG tablet Take 1 tablet (250 mg total) by mouth daily. 30 tablet 5  . HYDROmorphone (DILAUDID) 4 MG tablet Take 1 tablet (4 mg total) by mouth every 6 (six) hours as needed for severe pain. 20 tablet 0  . loperamide (IMODIUM A-D) 2 MG tablet Take 1 mg by mouth daily as needed for diarrhea or loose stools.    . LUTEIN PO Take 1 capsule by mouth daily. Reported on 10/27/2015    . magnesium 30 MG tablet Take 30 mg by mouth at bedtime.     . mesalamine (APRISO) 0.375 g 24 hr capsule TAKE 2 CAPSULE BY MOUTH TWICE DAILY 120 capsule 11  . Multiple Vitamins-Minerals (AIRBORNE) CHEW Chew 1 tablet by mouth daily.    . Multiple Vitamins-Minerals (HAIR/SKIN/NAILS) TABS Take 1 tablet by mouth daily.    . Multiple Vitamins-Minerals (PRESERVISION AREDS 2 PO) Take 1 tablet by mouth  2 (two) times daily with a meal.    . NON FORMULARY Eye In jection every 8-10weeks.    . NON FORMULARY Place 1 drop into both eyes. Thera Tears    . Omega-3 Fatty Acids (FISH OIL) 1200 MG CAPS Take 1,200 mg by mouth daily.    . ondansetron (ZOFRAN ODT) 4 MG disintegrating tablet Take 1 tablet (4 mg total) by mouth every 8 (eight) hours as needed. 10 tablet 1  . potassium chloride (K-DUR,KLOR-CON) 10 MEQ tablet TAKE 1 TABLET BY MOUTH EVERY DAY 90 tablet 1  . warfarin (COUMADIN) 5 MG tablet TAKE ONE TABLET BY MOUTH DAILY EXCEPT ONE AND ONE-HALF TABLETS ON TUESDAYS AND  FRIDAYS 40 tablet 6   No current facility-administered medications for this visit.     SURGICAL HISTORY:  Past Surgical History:  Procedure Laterality Date  . Bladder tack  1996  . CARDIOVERSION  11/02/09   x 2  . CATARACT EXTRACTION  2010/2011  . COLONOSCOPY  3 YRS AGO  . COLONOSCOPY  03/06/2011   Procedure: COLONOSCOPY;  Surgeon: Rogene Houston, MD;  [facility];  Service: Endoscopy;  Laterality: N/A;  7:30 am  . ESOPHAGOGASTRODUODENOSCOPY    . FLEXIBLE SIGMOIDOSCOPY N/A 10/04/2012   Procedure: FLEXIBLE SIGMOIDOSCOPY;  Surgeon: Rogene Houston, MD;  [facility];  Service: Endoscopy;  Laterality: N/A;  11:00-moved to 1015 Ann to notify pt  . THYROID SURGERY  1961  . TONSILLECTOMY    . VIDEO BRONCHOSCOPY  05/31/2011   Procedure: VIDEO BRONCHOSCOPY;  Surgeon: Grace Isaac, MD;  [facility] OR;  Service: Thoracic;  Laterality: N/A;    REVIEW OF SYSTEMS:   Review of Systems  Constitutional: Negative for appetite change, chills, fatigue, fever and unexpected weight change.  HENT:   Negative for mouth sores, nosebleeds, sore throat and trouble swallowing.   Eyes: Negative for eye problems and icterus.  Respiratory: Negative for cough, hemoptysis, shortness of breath and wheezing.   Cardiovascular: Negative for chest pain and leg swelling.  Gastrointestinal: Negative for abdominal pain, constipation, diarrhea, nausea and vomiting.  Genitourinary: Negative for bladder incontinence, difficulty urinating, dysuria, frequency and hematuria.   Musculoskeletal: Negative for back pain, gait problem, neck pain and neck stiffness.  Skin: Negative for itching and rash.  Neurological: Negative for dizziness, extremity weakness, gait problem, headaches, light-headedness and seizures.  Hematological: Negative for adenopathy. Does not bruise/bleed easily.  Psychiatric/Behavioral: Negative for confusion, depression and sleep disturbance. The patient is not nervous/anxious.      PHYSICAL EXAMINATION:  Blood pressure 125/70, pulse 79, temperature 97.6 F (36.4 C), temperature source Oral, resp. rate 12, height _0  (1.6 m), weight 111 lb 3.2 oz (50.4 kg), SpO2 99 %.  ECOG PERFORMANCE STATUS: 1 - Symptomatic but completely ambulatory  Physical Exam  Constitutional: Oriented to person, place, and time and well-developed, well-nourished, and in no distress. No distress.  HENT:  Head: Normocephalic and atraumatic.  Mouth/Throat: Oropharynx is clear and moist. No oropharyngeal exudate.  Eyes: Conjunctivae are normal. Right eye exhibits no discharge. Left eye exhibits no discharge. No scleral icterus.  Neck: Normal range of motion. Neck supple.  Cardiovascular: Normal rate, regular rhythm, normal heart sounds and intact distal pulses.   Pulmonary/Chest: Effort normal and breath sounds normal. No respiratory distress. No wheezes. No rales.  Abdominal: Soft. Bowel sounds are normal. Exhibits no distension and no mass. There is no tenderness.  Musculoskeletal: Normal range of motion. Exhibits no edema.  Lymphadenopathy:  No cervical adenopathy.  Neurological: Alert and oriented to person, place, and time. Exhibits normal muscle tone. Gait normal. Coordination normal.  Skin: Skin is warm and dry. No rash noted. Not diaphoretic. No erythema. No pallor.  Psychiatric: Mood, memory and judgment normal.  Vitals reviewed.  LABORATORY DATA: Lab Results  Component Value Date   WBC 4.6 03/25/2018   HGB 13.9 03/25/2018   HCT 43.2 03/25/2018   MCV 93.1 03/25/2018   PLT 270 03/25/2018      Chemistry      Component Value Date/Time   NA 142 03/25/2018 1229   NA 141 05/14/2017 1304   K 4.0 03/25/2018 1229   K 4.3 05/14/2017 1304   CL 104 03/25/2018 1229   CO2 30 03/25/2018 1229   CO2 30 (H) 05/14/2017 1304   BUN 15 03/25/2018 1229   BUN 19.0 05/14/2017 1304   CREATININE 0.78 03/25/2018 1229   CREATININE 0.8 05/14/2017 1304      Component Value Date/Time    CALCIUM 9.3 03/25/2018 1229   CALCIUM 9.1 05/14/2017 1304   ALKPHOS 88 03/25/2018 1229   ALKPHOS 86 05/14/2017 1304   AST 26 03/25/2018 1229   AST 25 05/14/2017 1304   ALT 15 03/25/2018 1229   ALT 15 05/14/2017 1304   BILITOT 0.6 03/25/2018 1229   BILITOT 0.57 05/14/2017 1304       RADIOGRAPHIC STUDIES:  No results found.   ASSESSMENT/PLAN:  Malignant neoplasm of right upper lobe of lung [facility]) This is a very pleasant 84 year old white female with recurrent non-small cell lung cancer, adenocarcinoma with positive EGFR mutation. The patient has been on treatment with Iressa 250 mg p.o. daily for the last 34 months. She has been tolerating her treatment with Iressa fairly well. Recommend for her to continue treatment with Iressa 250 mg p.o. daily.  She will have a repeat CT scan of the chest in 6 months.  She will follow-up 1 to 2 days after the CT scan to discuss the results.  She was advised to call immediately if she has any concerning symptoms in the interval. The patient voices understanding of current disease status and treatment options and is in agreement with the current care plan. All questions were answered. The patient knows to call the clinic with any problems, questions or concerns. We can certainly see the patient much sooner if necessary.   Orders Placed This Encounter  Procedures  . CT CHEST W CONTRAST    Standing Status:   Future    Standing Expiration Date:   03/26/2019    Order Specific Question:   If indicated for the ordered procedure, I authorize the administration of contrast media per Radiology protocol    Answer:   Yes    Order Specific Question:   Preferred imaging location?    Answer:   [facility]    Order Specific Question:   Radiology Contrast Protocol - do NOT remove file path    Answer:   [email or web address]    Order Specific Question:   ** REASON FOR EXAM (FREE TEXT)    Answer:   Lung cancer. Restaging.  Marland Kitchen CBC  with Differential (Cancer Center Only)    Standing Status:   Future    Standing Expiration Date:   03/26/2019  . CMP ([place] only)    Standing Status:   Future    Standing Expiration Date:   03/26/2019     Mikey Bussing, DNP, AGPCNP-BC, AOCNP 03/25/18

## 2018-03-25 NOTE — Telephone Encounter (Signed)
Printed avs and calender of upcoming appointment. Per 11/11 los Gave no contrast Abd/ct canceled patient will be getting chest only

## 2018-03-26 ENCOUNTER — Ambulatory Visit (INDEPENDENT_AMBULATORY_CARE_PROVIDER_SITE_OTHER): Payer: Medicare Other | Admitting: *Deleted

## 2018-03-26 DIAGNOSIS — I4891 Unspecified atrial fibrillation: Secondary | ICD-10-CM

## 2018-03-26 DIAGNOSIS — Z5181 Encounter for therapeutic drug level monitoring: Secondary | ICD-10-CM | POA: Diagnosis not present

## 2018-03-26 LAB — POCT INR: INR: 2.4 (ref 2.0–3.0)

## 2018-03-26 NOTE — Assessment & Plan Note (Signed)
This is a very pleasant 84 year old white female with recurrent non-small cell lung cancer, adenocarcinoma with positive EGFR mutation. The patient has been on treatment with Iressa 250 mg p.o. daily for the last 34 months. She has been tolerating her treatment with Iressa fairly well. Recommend for her to continue treatment with Iressa 250 mg p.o. daily.  She will have a repeat CT scan of the chest in 6 months.  She will follow-up 1 to 2 days after the CT scan to discuss the results.  She was advised to call immediately if she has any concerning symptoms in the interval. The patient voices understanding of current disease status and treatment options and is in agreement with the current care plan. All questions were answered. The patient knows to call the clinic with any problems, questions or concerns. We can certainly see the patient much sooner if necessary.

## 2018-03-26 NOTE — Patient Instructions (Signed)
Continue coumadin 1 tablet daily except 1 1/2 tablets on Tuesdays, Thursdays and Saturdays Recheck in 6 weeks

## 2018-03-28 ENCOUNTER — Other Ambulatory Visit: Payer: Self-pay | Admitting: Medical Oncology

## 2018-03-28 DIAGNOSIS — C3490 Malignant neoplasm of unspecified part of unspecified bronchus or lung: Secondary | ICD-10-CM

## 2018-03-28 MED ORDER — GEFITINIB 250 MG PO TABS: 250.0000 mg | ORAL_TABLET | Freq: Every day | ORAL | 5 refills | Status: DC

## 2018-04-02 NOTE — Telephone Encounter (Signed)
Oral Oncology Patient Advocate Encounter  Natalie Carlson called today and her AZ&ME manufacturer assistance will be automatically renewed for 05/15/18-05/15/19.  [place] Patient [place] Phone [phone] Fax [phone]

## 2018-04-13 DIAGNOSIS — K5090 Crohn's disease, unspecified, without complications: Secondary | ICD-10-CM | POA: Diagnosis not present

## 2018-04-13 DIAGNOSIS — C3411 Malignant neoplasm of upper lobe, right bronchus or lung: Secondary | ICD-10-CM | POA: Diagnosis not present

## 2018-04-13 DIAGNOSIS — I482 Chronic atrial fibrillation, unspecified: Secondary | ICD-10-CM | POA: Diagnosis not present

## 2018-05-01 DIAGNOSIS — Z0001 Encounter for general adult medical examination with abnormal findings: Secondary | ICD-10-CM | POA: Diagnosis not present

## 2018-05-01 DIAGNOSIS — Z681 Body mass index (BMI) 19 or less, adult: Secondary | ICD-10-CM | POA: Diagnosis not present

## 2018-05-01 DIAGNOSIS — Z23 Encounter for immunization: Secondary | ICD-10-CM | POA: Diagnosis not present

## 2018-05-03 ENCOUNTER — Ambulatory Visit (HOSPITAL_COMMUNITY)
Admission: RE | Admit: 2018-05-03 | Discharge: 2018-05-03 | Disposition: A | Discharge status: Home / Self Care | Payer: Medicare Other | Source: Ambulatory Visit | Attending: Oncology | Admitting: Oncology

## 2018-05-03 DIAGNOSIS — C3411 Malignant neoplasm of upper lobe, right bronchus or lung: Secondary | ICD-10-CM | POA: Diagnosis not present

## 2018-05-03 DIAGNOSIS — C3490 Malignant neoplasm of unspecified part of unspecified bronchus or lung: Secondary | ICD-10-CM | POA: Diagnosis not present

## 2018-05-03 MED ORDER — IOHEXOL 300 MG/ML  SOLN
75.0000 mL | Freq: Once | INTRAMUSCULAR | Status: AC | PRN
  Administered 2018-05-03: 75 mL via INTRAVENOUS

## 2018-05-06 ENCOUNTER — Telehealth: Payer: Self-pay | Admitting: Pharmacist

## 2018-05-06 ENCOUNTER — Inpatient Hospital Stay: Payer: Medicare Other | Attending: Internal Medicine

## 2018-05-06 ENCOUNTER — Encounter: Payer: Self-pay | Admitting: Internal Medicine

## 2018-05-06 ENCOUNTER — Telehealth: Payer: Self-pay

## 2018-05-06 ENCOUNTER — Inpatient Hospital Stay (HOSPITAL_BASED_OUTPATIENT_CLINIC_OR_DEPARTMENT_OTHER): Payer: Medicare Other | Admitting: Internal Medicine

## 2018-05-06 VITALS — BP 139/78 | HR 82 | Temp 98.2°F | Resp 18 | Ht 63.0 in | Wt 110.9 lb

## 2018-05-06 DIAGNOSIS — C3411 Malignant neoplasm of upper lobe, right bronchus or lung: Secondary | ICD-10-CM

## 2018-05-06 DIAGNOSIS — Z79899 Other long term (current) drug therapy: Secondary | ICD-10-CM | POA: Diagnosis not present

## 2018-05-06 DIAGNOSIS — I4891 Unspecified atrial fibrillation: Secondary | ICD-10-CM

## 2018-05-06 DIAGNOSIS — Z5181 Encounter for therapeutic drug level monitoring: Secondary | ICD-10-CM

## 2018-05-06 DIAGNOSIS — Z7901 Long term (current) use of anticoagulants: Secondary | ICD-10-CM | POA: Insufficient documentation

## 2018-05-06 LAB — CBC WITH DIFFERENTIAL (CANCER CENTER ONLY)
ABS IMMATURE GRANULOCYTES: 0 10*3/uL (ref 0.00–0.07)
Basophils Absolute: 0 10*3/uL (ref 0.0–0.1)
Basophils Relative: 1 %
Eosinophils Absolute: 0.2 10*3/uL (ref 0.0–0.5)
Eosinophils Relative: 4 %
HCT: 41.5 % (ref 36.0–46.0)
HEMOGLOBIN: 13.2 g/dL (ref 12.0–15.0)
Immature Granulocytes: 0 %
LYMPHS PCT: 24 %
Lymphs Abs: 1.1 10*3/uL (ref 0.7–4.0)
MCH: 29.7 pg (ref 26.0–34.0)
MCHC: 31.8 g/dL (ref 30.0–36.0)
MCV: 93.5 fL (ref 80.0–100.0)
MONO ABS: 0.7 10*3/uL (ref 0.1–1.0)
MONOS PCT: 16 %
NEUTROS ABS: 2.5 10*3/uL (ref 1.7–7.7)
Neutrophils Relative %: 55 %
Platelet Count: 245 10*3/uL (ref 150–400)
RBC: 4.44 MIL/uL (ref 3.87–5.11)
RDW: 13.4 % (ref 11.5–15.5)
WBC Count: 4.4 10*3/uL (ref 4.0–10.5)
nRBC: 0 % (ref 0.0–0.2)

## 2018-05-06 LAB — CMP (CANCER CENTER ONLY)
ALT: 16 U/L (ref 0–44)
AST: 27 U/L (ref 15–41)
Albumin: 3.4 g/dL — ABNORMAL LOW (ref 3.5–5.0)
Alkaline Phosphatase: 93 U/L (ref 38–126)
Anion gap: 8 (ref 5–15)
BUN: 14 mg/dL (ref 8–23)
CO2: 27 mmol/L (ref 22–32)
Calcium: 9 mg/dL (ref 8.9–10.3)
Chloride: 104 mmol/L (ref 98–111)
Creatinine: 0.81 mg/dL (ref 0.44–1.00)
GFR, Est AFR Am: >60 mL/min (ref >60)
GFR, Estimated: >60 mL/min (ref >60)
Glucose, Bld: 88 mg/dL (ref 70–99)
Potassium: 4.3 mmol/L (ref 3.5–5.1)
Sodium: 139 mmol/L (ref 135–145)
Total Bilirubin: 0.6 mg/dL (ref 0.3–1.2)
Total Protein: 7 g/dL (ref 6.5–8.1)

## 2018-05-06 MED ORDER — GEFITINIB 250 MG PO TABS: 250.0000 mg | ORAL_TABLET | Freq: Every day | ORAL | 0 refills | Status: DC

## 2018-05-06 NOTE — Telephone Encounter (Signed)
Oral Chemotherapy Pharmacist Encounter  Follow-Up Form  Spoke with patient in exam room today to follow up regarding patient's oral chemotherapy medication: Iressa (gefitinib) for the treatment of metastatic non small cell lung cancer, with known EGFR mutation (exon 19 deletion), until disease progression or unacceptable toxicity.  Original Start date of oral chemotherapy: 04/17/2015  Pt is doing well today  Pt reports 0 tablets/doses of Iressa 276m tablets, 1 tablet taken by mouth, without regard to food, missed in the last month.  Patient states she takes her Iressa before breakfast every morning.  Pt reports the following side effects: dry skin, itchiness, intermittent diarrhea, cavities, and macular degeneration. She has been managing them all very well since therapy initiation.  Pertinent labs reviewed: ORichmondfor continued treatment.  Other Issues:  Patient enrolled with AZ&me prescription savings program through calendar year 2020.  We will [facility] with the use of copayment grant secured in early November 2019. [facility] for delivery to patient's home on 05/23/18. Home address verified.  Patient knows to call the office with questions or concerns. Oral Oncology Clinic will continue to follow.  JJohny Drilling PharmD, BCPS, BCOP  05/06/2018 1:17 PM Oral Oncology Clinic [phone]

## 2018-05-06 NOTE — Progress Notes (Signed)
[place] [phone]   Fax:([phone]  OFFICE PROGRESS NOTE  [name], MD [place] [address]  DIAGNOSIS: Recurrent non-small cell lung cancer presented with bilateral lung nodules in October 2016 initially diagnosed as a stage IA adenocarcinoma with positive EGFR mutation with deletion in exon 16 in December 2012.  PRIOR THERAPY: Status post bronchoscopy with right VATS and right upper lobectomy with lymph node dissection under the care of Dr. Servando Snare on 05/31/2011.  CURRENT THERAPY: Iressa 250 mg by mouth daily started 04/17/2015, status post 36 months of treatment.  INTERVAL HISTORY: Natalie Carlson 84 y.o. female returns to the clinic today for follow-up visit accompanied by friend.  The patient is feeling fine today with no concerning complaints.  She denied having any current chest pain, shortness of breath, cough or hemoptysis.  She denied having any fever or chills.  She has no nausea, vomiting, diarrhea or constipation.  She has no headache or visual changes.  She continues to tolerate her treatment with Aricept fairly well.  She is here today for evaluation with repeat CT scan of the chest for restaging of her disease.   MEDICAL HISTORY: Past Medical History:  Diagnosis Date  . Arthritis   . Atrial fibrillation ([place])   . Cataract of both eyes   . Chronic back pain   . Chronic diarrhea   . Colitis   . Hemorrhoids   . History of colon polyps   . History of migraines   . Hyperlipidemia   . Hypothyroidism   . Macular degeneration, dry   . Malignant neoplasm of right upper lobe of lung (HCC)    Stage 1A  EGFR positive  Deletion in exon 19.  ALK-negative  Right Upper Lobe Lung Mass  SUV 2.9 on PET Status post VATS and right upper lobe lobectomy 05/31/2011  . Mucus in stool   . Nephrolithiasis    1994 - passed on its on    ALLERGIES:  is allergic to doxycycline; entocort ec [budesonide]; neurontin [gabapentin]; vicodin  [hydrocodone-acetaminophen]; adhesive [tape]; amoxicillin; chocolate; codeine; levaquin [levofloxacin in d5w]; morphine and related; naprosyn [naproxen]; penicillins; prednisone; tramadol; and uncoded nonscreenable allergen.  MEDICATIONS:  Current Outpatient Medications  Medication Sig Dispense Refill  . Calcium Carbonate (CALCIUM 600 PO) Take 1 tablet by mouth 2 (two) times daily.    . diclofenac sodium (VOLTAREN) 1 % GEL Apply 1 application topically 2 (two) times daily as needed (for neck pain).     Marland Kitchen diltiazem (CARTIA XT) 180 MG 24 hr capsule Take 1 capsule (180 mg total) by mouth daily. 90 capsule 2  . furosemide (LASIX) 20 MG tablet TAKE 1 TABLET BY MOUTH EVERY DAY AS NEEDED FOR FLUID 90 tablet 1  . gefitinib (IRESSA) 250 MG tablet Take 1 tablet (250 mg total) by mouth daily. 30 tablet 5  . HYDROmorphone (DILAUDID) 4 MG tablet Take 1 tablet (4 mg total) by mouth every 6 (six) hours as needed for severe pain. 20 tablet 0  . loperamide (IMODIUM A-D) 2 MG tablet Take 1 mg by mouth daily as needed for diarrhea or loose stools.    . LUTEIN PO Take 1 capsule by mouth daily. Reported on 10/27/2015    . magnesium 30 MG tablet Take 30 mg by mouth at bedtime.     . mesalamine (APRISO) 0.375 g 24 hr capsule TAKE 2 CAPSULE BY MOUTH TWICE DAILY 120 capsule 11  . Multiple Vitamins-Minerals (  AIRBORNE) CHEW Chew 1 tablet by mouth daily.    . Multiple Vitamins-Minerals (HAIR/SKIN/NAILS) TABS Take 1 tablet by mouth daily.    . Multiple Vitamins-Minerals (PRESERVISION AREDS 2 PO) Take 1 tablet by mouth 2 (two) times daily with a meal.    . NON FORMULARY Eye In jection every 8-10weeks.    . NON FORMULARY Place 1 drop into both eyes. Thera Tears    . Omega-3 Fatty Acids (FISH OIL) 1200 MG CAPS Take 1,200 mg by mouth daily.    . ondansetron (ZOFRAN ODT) 4 MG disintegrating tablet Take 1 tablet (4 mg total) by mouth every 8 (eight) hours as needed. 10 tablet 1  . potassium chloride (K-DUR,KLOR-CON) 10 MEQ tablet  TAKE 1 TABLET BY MOUTH EVERY DAY 90 tablet 1  . warfarin (COUMADIN) 5 MG tablet TAKE ONE TABLET BY MOUTH DAILY EXCEPT ONE AND ONE-HALF TABLETS ON TUESDAYS AND FRIDAYS 40 tablet 6   No current facility-administered medications for this visit.     SURGICAL HISTORY:  Past Surgical History:  Procedure Laterality Date  . Bladder tack  1996  . CARDIOVERSION  11/02/09   x 2  . CATARACT EXTRACTION  2010/2011  . COLONOSCOPY  3 YRS AGO  . COLONOSCOPY  03/06/2011   Procedure: COLONOSCOPY;  Surgeon: Rogene Houston, MD;  [facility];  Service: Endoscopy;  Laterality: N/A;  7:30 am  . ESOPHAGOGASTRODUODENOSCOPY    . FLEXIBLE SIGMOIDOSCOPY N/A 10/04/2012   Procedure: FLEXIBLE SIGMOIDOSCOPY;  Surgeon: Rogene Houston, MD;  [facility];  Service: Endoscopy;  Laterality: N/A;  11:00-moved to 1015 Ann to notify pt  . THYROID SURGERY  1961  . TONSILLECTOMY    . VIDEO BRONCHOSCOPY  05/31/2011   Procedure: VIDEO BRONCHOSCOPY;  Surgeon: Grace Isaac, MD;  Location: [facility] OR;  Service: Thoracic;  Laterality: N/A;    REVIEW OF SYSTEMS:  Constitutional: negative Eyes: negative Ears, nose, mouth, throat, and face: negative Respiratory: negative Cardiovascular: negative Gastrointestinal: negative Genitourinary:negative Integument/breast: negative Hematologic/lymphatic: negative Musculoskeletal:negative Neurological: negative Behavioral/Psych: negative Endocrine: negative Allergic/Immunologic: negative   PHYSICAL EXAMINATION: General appearance: alert, cooperative and no distress Head: Normocephalic, without obvious abnormality, atraumatic Neck: no adenopathy, no JVD, supple, symmetrical, trachea midline and thyroid not enlarged, symmetric, no tenderness/mass/nodules Lymph nodes: Cervical, supraclavicular, and axillary nodes normal. Resp: clear to auscultation bilaterally Back: symmetric, no curvature. ROM normal. No CVA tenderness. Cardio: regular rate and rhythm, S1, S2  normal, no murmur, click, rub or gallop GI: soft, non-tender; bowel sounds normal; no masses,  no organomegaly Extremities: extremities normal, atraumatic, no cyanosis or edema Neurologic: Alert and oriented X 3, normal strength and tone. Normal symmetric reflexes. Normal coordination and gait  ECOG PERFORMANCE STATUS: 0 - Asymptomatic  Blood pressure 139/78, pulse 82, temperature 98.2 F (36.8 C), temperature source Oral, resp. rate 18, height _0  (1.6 m), weight 110 lb 14.4 oz (50.3 kg), SpO2 96 %.  LABORATORY DATA: Lab Results  Component Value Date   WBC 4.4 05/06/2018   HGB 13.2 05/06/2018   HCT 41.5 05/06/2018   MCV 93.5 05/06/2018   PLT 245 05/06/2018      Chemistry      Component Value Date/Time   NA 142 03/25/2018 1229   NA 141 05/14/2017 1304   K 4.0 03/25/2018 1229   K 4.3 05/14/2017 1304   CL 104 03/25/2018 1229   CO2 30 03/25/2018 1229   CO2 30 (H) 05/14/2017 1304   BUN 15 03/25/2018 1229  BUN 19.0 05/14/2017 1304   CREATININE 0.78 03/25/2018 1229   CREATININE 0.8 05/14/2017 1304      Component Value Date/Time   CALCIUM 9.3 03/25/2018 1229   CALCIUM 9.1 05/14/2017 1304   ALKPHOS 88 03/25/2018 1229   ALKPHOS 86 05/14/2017 1304   AST 26 03/25/2018 1229   AST 25 05/14/2017 1304   ALT 15 03/25/2018 1229   ALT 15 05/14/2017 1304   BILITOT 0.6 03/25/2018 1229   BILITOT 0.57 05/14/2017 1304       RADIOGRAPHIC STUDIES: Ct Chest W Contrast  Result Date: 05/03/2018 CLINICAL DATA:  Restaging lung cancer. EXAM: CT CHEST WITH CONTRAST TECHNIQUE: Multidetector CT imaging of the chest was performed during intravenous contrast administration. CONTRAST:  59m OMNIPAQUE IOHEXOL 300 MG/ML  SOLN COMPARISON:  Multiple previous chest CTs. The most recent is 02/06/2018 FINDINGS: Cardiovascular: The heart is normal in size. No pericardial effusion. Stable enlargement of the left atrium with prominent left atrial appendage and pulmonary veins. There is stable tortuosity,  ectasia and calcification of the thoracic aorta but no dissection. The branch vessels are patent. Stable coronary artery calcifications. Mediastinum/Nodes: No mediastinal or hilar mass or lymphadenopathy. Small scattered lymph nodes are stable. The esophagus is grossly normal. Lungs/Pleura: Stable 7.5 mm left apical pulmonary nodule. Stable emphysematous changes and pulmonary scarring. Stable perifissural nodularity and peripheral interstitial lung disease. No worrisome pulmonary lesions or acute overlying pulmonary process. Upper Abdomen: No significant upper abdominal findings. No hepatic or adrenal gland lesions are identified. Musculoskeletal: No significant bony findings. No breast masses, supraclavicular or axillary lymphadenopathy. Scattered lymph nodes are noted. Stable multinodular thyroid goiter with a dominant 18 mm nodule in the right lobe. IMPRESSION: 1. Stable CT appearance of the chest since prior studies. No new or worrisome findings are identified. 2. Stable 7.5 mm left apical nodule. 3. Stable chronic lung disease. No findings for metastatic pulmonary disease and no mediastinal or hilar lymphadenopathy. Aortic Atherosclerosis (ICD10-I70.0) and Emphysema (ICD10-J43.9). Electronically Signed   By: PMarijo SanesM.D.   On: 05/03/2018 15:12    ASSESSMENT AND PLAN:  This is a very pleasant 84years old white female with recurrent non-small cell lung cancer, adenocarcinoma with positive EGFR mutation. The patient has been on treatment with Iressa 250 mg p.o. daily for the last 36 months. The patient has been tolerating this treatment well with no concerning adverse effects. She had repeat CT scan of the chest performed recently.  I personally and independently reviewed the scans and discussed the results with the patient today. Her scan showed no concerning findings for disease progression. I recommended for the patient to continue her current treatment with Iressa with the same dose. I will  see her back for follow-up visit in 6 weeks for evaluation and repeat blood work. She was advised to call immediately if she has any concerning symptoms in the interval. The patient voices understanding of current disease status and treatment options and is in agreement with the current care plan. All questions were answered. The patient knows to call the clinic with any problems, questions or concerns. We can certainly see the patient much sooner if necessary. I spent 15 minutes counseling the patient face to face. The total time spent in the appointment was 25 minutes.   Disclaimer: This note was dictated with voice recognition software. Similar sounding words can inadvertently be transcribed and may not be corrected upon review.

## 2018-05-06 NOTE — Telephone Encounter (Signed)
Printed avs and calender of upcoming appointment. Per 12/23 los

## 2018-05-07 ENCOUNTER — Ambulatory Visit (INDEPENDENT_AMBULATORY_CARE_PROVIDER_SITE_OTHER): Payer: Medicare Other | Admitting: *Deleted

## 2018-05-07 DIAGNOSIS — I4891 Unspecified atrial fibrillation: Secondary | ICD-10-CM | POA: Diagnosis not present

## 2018-05-07 DIAGNOSIS — Z5181 Encounter for therapeutic drug level monitoring: Secondary | ICD-10-CM

## 2018-05-07 LAB — POCT INR: INR: 3.7 — AB (ref 2.0–3.0)

## 2018-05-07 NOTE — Patient Instructions (Signed)
Hold coumadin tonight then resume 1 tablet daily except 1 1/2 tablets on Tuesdays, Thursdays and Saturdays Recheck in 3 weeks

## 2018-05-08 ENCOUNTER — Other Ambulatory Visit: Payer: Self-pay | Admitting: Cardiology

## 2018-05-13 ENCOUNTER — Other Ambulatory Visit: Payer: Medicare Other

## 2018-05-13 ENCOUNTER — Ambulatory Visit: Payer: Medicare Other | Admitting: Internal Medicine

## 2018-05-13 DIAGNOSIS — H43391 Other vitreous opacities, right eye: Secondary | ICD-10-CM | POA: Diagnosis not present

## 2018-05-13 DIAGNOSIS — H353231 Exudative age-related macular degeneration, bilateral, with active choroidal neovascularization: Secondary | ICD-10-CM | POA: Diagnosis not present

## 2018-05-13 DIAGNOSIS — H35371 Puckering of macula, right eye: Secondary | ICD-10-CM | POA: Diagnosis not present

## 2018-05-13 DIAGNOSIS — H43813 Vitreous degeneration, bilateral: Secondary | ICD-10-CM | POA: Diagnosis not present

## 2018-05-22 MED FILL — IRESSA 250 MG TABLET: 250 | 30 days supply | Qty: 30 | Fill #0

## 2018-05-22 NOTE — Telephone Encounter (Signed)
Oral Oncology Patient Advocate Encounter  Confirmed with [place] that Iressa was shipped on 05/22/18 with a $0 copay using PANF grant.   [place] Patient Reader Phone [phone] Fax [phone]

## 2018-05-28 ENCOUNTER — Ambulatory Visit (INDEPENDENT_AMBULATORY_CARE_PROVIDER_SITE_OTHER): Payer: Medicare Other | Admitting: Pharmacist

## 2018-05-28 DIAGNOSIS — I4891 Unspecified atrial fibrillation: Secondary | ICD-10-CM

## 2018-05-28 DIAGNOSIS — Z5181 Encounter for therapeutic drug level monitoring: Secondary | ICD-10-CM

## 2018-05-28 LAB — POCT INR: INR: 2.8 (ref 2.0–3.0)

## 2018-05-28 NOTE — Patient Instructions (Signed)
Description   Continue 1 tablet daily except 1 1/2 tablets on Tuesdays, Thursdays and Saturdays Recheck in 4 weeks

## 2018-06-17 ENCOUNTER — Encounter: Payer: Self-pay | Admitting: Internal Medicine

## 2018-06-17 ENCOUNTER — Telehealth: Payer: Self-pay | Admitting: Internal Medicine

## 2018-06-17 ENCOUNTER — Inpatient Hospital Stay: Payer: Medicare Other

## 2018-06-17 ENCOUNTER — Inpatient Hospital Stay: Payer: Medicare Other | Attending: Internal Medicine | Admitting: Internal Medicine

## 2018-06-17 VITALS — BP 136/74 | HR 82 | Temp 97.8°F | Resp 18 | Ht 63.0 in | Wt 109.6 lb

## 2018-06-17 DIAGNOSIS — M1990 Unspecified osteoarthritis, unspecified site: Secondary | ICD-10-CM | POA: Diagnosis not present

## 2018-06-17 DIAGNOSIS — Z7901 Long term (current) use of anticoagulants: Secondary | ICD-10-CM | POA: Diagnosis not present

## 2018-06-17 DIAGNOSIS — M542 Cervicalgia: Secondary | ICD-10-CM | POA: Diagnosis not present

## 2018-06-17 DIAGNOSIS — Z5181 Encounter for therapeutic drug level monitoring: Secondary | ICD-10-CM

## 2018-06-17 DIAGNOSIS — I4891 Unspecified atrial fibrillation: Secondary | ICD-10-CM | POA: Insufficient documentation

## 2018-06-17 DIAGNOSIS — C3411 Malignant neoplasm of upper lobe, right bronchus or lung: Secondary | ICD-10-CM

## 2018-06-17 DIAGNOSIS — Z79899 Other long term (current) drug therapy: Secondary | ICD-10-CM

## 2018-06-17 DIAGNOSIS — G8929 Other chronic pain: Secondary | ICD-10-CM

## 2018-06-17 LAB — CMP (CANCER CENTER ONLY)
ALT: 14 U/L (ref 0–44)
AST: 25 U/L (ref 15–41)
Albumin: 3.5 g/dL (ref 3.5–5.0)
Alkaline Phosphatase: 90 U/L (ref 38–126)
Anion gap: 6 (ref 5–15)
BUN: 17 mg/dL (ref 8–23)
CO2: 31 mmol/L (ref 22–32)
Calcium: 9.1 mg/dL (ref 8.9–10.3)
Chloride: 103 mmol/L (ref 98–111)
Creatinine: 0.79 mg/dL (ref 0.44–1.00)
GFR, Est AFR Am: >60 mL/min (ref >60)
GFR, Estimated: >60 mL/min (ref >60)
Glucose, Bld: 130 mg/dL — ABNORMAL HIGH (ref 70–99)
POTASSIUM: 3.8 mmol/L (ref 3.5–5.1)
Sodium: 140 mmol/L (ref 135–145)
Total Bilirubin: 0.6 mg/dL (ref 0.3–1.2)
Total Protein: 7.1 g/dL (ref 6.5–8.1)

## 2018-06-17 LAB — CBC WITH DIFFERENTIAL (CANCER CENTER ONLY)
Abs Immature Granulocytes: 0.01 10*3/uL (ref 0.00–0.07)
Basophils Absolute: 0 10*3/uL (ref 0.0–0.1)
Basophils Relative: 1 %
Eosinophils Absolute: 0.1 10*3/uL (ref 0.0–0.5)
Eosinophils Relative: 3 %
HCT: 41.8 % (ref 36.0–46.0)
HEMOGLOBIN: 13.8 g/dL (ref 12.0–15.0)
Immature Granulocytes: 0 %
LYMPHS PCT: 29 %
Lymphs Abs: 1.2 10*3/uL (ref 0.7–4.0)
MCH: 30 pg (ref 26.0–34.0)
MCHC: 33 g/dL (ref 30.0–36.0)
MCV: 90.9 fL (ref 80.0–100.0)
Monocytes Absolute: 0.5 10*3/uL (ref 0.1–1.0)
Monocytes Relative: 12 %
NEUTROS ABS: 2.4 10*3/uL (ref 1.7–7.7)
Neutrophils Relative %: 55 %
Platelet Count: 267 10*3/uL (ref 150–400)
RBC: 4.6 MIL/uL (ref 3.87–5.11)
RDW: 13.6 % (ref 11.5–15.5)
WBC Count: 4.3 10*3/uL (ref 4.0–10.5)
nRBC: 0 % (ref 0.0–0.2)

## 2018-06-17 NOTE — Telephone Encounter (Signed)
Scheduled appt per 02/03 los.  Printed calendar.

## 2018-06-17 NOTE — Progress Notes (Signed)
[facility] [phone]   [phone]  OFFICE PROGRESS NOTE  [name], MD [place] [address]  DIAGNOSIS: Recurrent non-small cell lung cancer presented with bilateral lung nodules in October 2016 initially diagnosed as a stage IA adenocarcinoma with positive EGFR mutation with deletion in exon 63 in December 2012.  PRIOR THERAPY: Status post bronchoscopy with right VATS and right upper lobectomy with lymph node dissection under the care of Dr. Servando Snare on 05/31/2011.  CURRENT THERAPY: Iressa 250 mg by mouth daily started 04/17/2015, status post 38 months of treatment.  INTERVAL HISTORY: Natalie Carlson 85 y.o. female returns to the clinic today for follow-up visit accompanied by her friend.  The patient is feeling fine today with no concerning complaints.  She had 1 or 2 days of neck pain secondary to degenerative disc disease and she applied Voltaren cream to that area with some improvement.  She denied having any current chest pain, shortness of breath, cough or hemoptysis.  She denied having any fever or chills.  She has no nausea, vomiting, diarrhea or constipation.  She has no headache or visual changes.  She is here today for evaluation and repeat blood work.   MEDICAL HISTORY: Past Medical History:  Diagnosis Date  . Arthritis   . Atrial fibrillation ([place])   . Cataract of both eyes   . Chronic back pain   . Chronic diarrhea   . Colitis   . Hemorrhoids   . History of colon polyps   . History of migraines   . Hyperlipidemia   . Hypothyroidism   . Macular degeneration, dry   . Malignant neoplasm of right upper lobe of lung (HCC)    Stage 1A  EGFR positive  Deletion in exon 19.  ALK-negative  Right Upper Lobe Lung Mass  SUV 2.9 on PET Status post VATS and right upper lobe lobectomy 05/31/2011  . Mucus in stool   . Nephrolithiasis    1994 - passed on its on    ALLERGIES:  is allergic to doxycycline; entocort ec  [budesonide]; neurontin [gabapentin]; vicodin [hydrocodone-acetaminophen]; adhesive [tape]; amoxicillin; chocolate; codeine; levaquin [levofloxacin in d5w]; morphine and related; naprosyn [naproxen]; penicillins; prednisone; tramadol; and uncoded nonscreenable allergen.  MEDICATIONS:  Current Outpatient Medications  Medication Sig Dispense Refill  . Calcium Carbonate (CALCIUM 600 PO) Take 1 tablet by mouth 2 (two) times daily.    . diclofenac sodium (VOLTAREN) 1 % GEL Apply 1 application topically 2 (two) times daily as needed (for neck pain).     Marland Kitchen diltiazem (CARTIA XT) 180 MG 24 hr capsule Take 1 capsule (180 mg total) by mouth daily. 90 capsule 2  . furosemide (LASIX) 20 MG tablet TAKE 1 TABLET BY MOUTH EVERY DAY AS NEEDED FOR FLUID 90 tablet 1  . gefitinib (IRESSA) 250 MG tablet Take 1 tablet (250 mg total) by mouth daily. 30 tablet 5  . HYDROmorphone (DILAUDID) 4 MG tablet Take 1 tablet (4 mg total) by mouth every 6 (six) hours as needed for severe pain. 20 tablet 0  . loperamide (IMODIUM A-D) 2 MG tablet Take 1 mg by mouth daily as needed for diarrhea or loose stools.    . LUTEIN PO Take 1 capsule by mouth daily. Reported on 10/27/2015    . magnesium 30 MG tablet Take 30 mg by mouth at bedtime.     . mesalamine (APRISO) 0.375 g 24 hr capsule TAKE 2 CAPSULE BY MOUTH TWICE  DAILY 120 capsule 11  . Multiple Vitamins-Minerals (AIRBORNE) CHEW Chew 1 tablet by mouth daily.    . Multiple Vitamins-Minerals (HAIR/SKIN/NAILS) TABS Take 1 tablet by mouth daily.    . Multiple Vitamins-Minerals (PRESERVISION AREDS 2 PO) Take 1 tablet by mouth 2 (two) times daily with a meal.    . NON FORMULARY Eye In jection every 8-10weeks.    . NON FORMULARY Place 1 drop into both eyes. Thera Tears    . Omega-3 Fatty Acids (FISH OIL) 1200 MG CAPS Take 1,200 mg by mouth daily.    . ondansetron (ZOFRAN ODT) 4 MG disintegrating tablet Take 1 tablet (4 mg total) by mouth every 8 (eight) hours as needed. 10 tablet 1  .  potassium chloride (K-DUR,KLOR-CON) 10 MEQ tablet TAKE 1 TABLET BY MOUTH EVERY DAY 90 tablet 1  . warfarin (COUMADIN) 5 MG tablet TAKE 1 TABLET BY MOUTH DAILY EXCEPT ONE AND ONE-HALF TABLETS ON TUESDAYS AND FRIDAYS 40 tablet 3   No current facility-administered medications for this visit.     SURGICAL HISTORY:  Past Surgical History:  Procedure Laterality Date  . Bladder tack  1996  . CARDIOVERSION  11/02/09   x 2  . CATARACT EXTRACTION  2010/2011  . COLONOSCOPY  3 YRS AGO  . COLONOSCOPY  03/06/2011   Procedure: COLONOSCOPY;  Surgeon: Rogene Houston, MD;  [facility];  Service: Endoscopy;  Laterality: N/A;  7:30 am  . ESOPHAGOGASTRODUODENOSCOPY    . FLEXIBLE SIGMOIDOSCOPY N/A 10/04/2012   Procedure: FLEXIBLE SIGMOIDOSCOPY;  Surgeon: Rogene Houston, MD;  [facility];  Service: Endoscopy;  Laterality: N/A;  11:00-moved to 1015 Ann to notify pt  . THYROID SURGERY  1961  . TONSILLECTOMY    . VIDEO BRONCHOSCOPY  05/31/2011   Procedure: VIDEO BRONCHOSCOPY;  Surgeon: Grace Isaac, MD;  Location: [facility] OR;  Service: Thoracic;  Laterality: N/A;    REVIEW OF SYSTEMS:  A comprehensive review of systems was negative except for: Musculoskeletal: positive for neck pain   PHYSICAL EXAMINATION: General appearance: alert, cooperative and no distress Head: Normocephalic, without obvious abnormality, atraumatic Neck: no adenopathy, no JVD, supple, symmetrical, trachea midline and thyroid not enlarged, symmetric, no tenderness/mass/nodules Lymph nodes: Cervical, supraclavicular, and axillary nodes normal. Resp: clear to auscultation bilaterally Back: symmetric, no curvature. ROM normal. No CVA tenderness. Cardio: regular rate and rhythm, S1, S2 normal, no murmur, click, rub or gallop GI: soft, non-tender; bowel sounds normal; no masses,  no organomegaly Extremities: extremities normal, atraumatic, no cyanosis or edema  ECOG PERFORMANCE STATUS: 0 - Asymptomatic  Blood  pressure 136/74, pulse 82, temperature 97.8 F (36.6 C), temperature source Oral, resp. rate 18, height _0  (1.6 m), weight 109 lb 9.6 oz (49.7 kg), SpO2 99 %.  LABORATORY DATA: Lab Results  Component Value Date   WBC 4.3 06/17/2018   HGB 13.8 06/17/2018   HCT 41.8 06/17/2018   MCV 90.9 06/17/2018   PLT 267 06/17/2018      Chemistry      Component Value Date/Time   NA 139 05/06/2018 1145   NA 141 05/14/2017 1304   K 4.3 05/06/2018 1145   K 4.3 05/14/2017 1304   CL 104 05/06/2018 1145   CO2 27 05/06/2018 1145   CO2 30 (H) 05/14/2017 1304   BUN 14 05/06/2018 1145   BUN 19.0 05/14/2017 1304   CREATININE 0.81 05/06/2018 1145   CREATININE 0.8 05/14/2017 1304      Component Value Date/Time  CALCIUM 9.0 05/06/2018 1145   CALCIUM 9.1 05/14/2017 1304   ALKPHOS 93 05/06/2018 1145   ALKPHOS 86 05/14/2017 1304   AST 27 05/06/2018 1145   AST 25 05/14/2017 1304   ALT 16 05/06/2018 1145   ALT 15 05/14/2017 1304   BILITOT 0.6 05/06/2018 1145   BILITOT 0.57 05/14/2017 1304       RADIOGRAPHIC STUDIES: No results found.  ASSESSMENT AND PLAN:  This is a very pleasant 85 years old white female with recurrent non-small cell lung cancer, adenocarcinoma with positive EGFR mutation. The patient has been on treatment with Iressa 250 mg p.o. daily for the last 38 months. The patient continues to tolerate her treatment well with no concerning adverse effects. I recommended for her to continue her current treatment with Iressa with the same dose. I will see her back for follow-up visit in 6 weeks for evaluation and repeat blood work. For the neck pain, she will continue to apply Voltaren cream and she will also use ibuprofen on as-needed basis. The patient was advised to call immediately if she has any concerning symptoms in the interval. The patient voices understanding of current disease status and treatment options and is in agreement with the current care plan. All questions were  answered. The patient knows to call the clinic with any problems, questions or concerns. We can certainly see the patient much sooner if necessary. I spent 10 minutes counseling the patient face to face. The total time spent in the appointment was 15 minutes.   Disclaimer: This note was dictated with voice recognition software. Similar sounding words can inadvertently be transcribed and may not be corrected upon review.

## 2018-06-25 ENCOUNTER — Ambulatory Visit (INDEPENDENT_AMBULATORY_CARE_PROVIDER_SITE_OTHER): Payer: Medicare Other | Admitting: Pharmacist

## 2018-06-25 DIAGNOSIS — I4891 Unspecified atrial fibrillation: Secondary | ICD-10-CM

## 2018-06-25 DIAGNOSIS — Z5181 Encounter for therapeutic drug level monitoring: Secondary | ICD-10-CM | POA: Diagnosis not present

## 2018-06-25 LAB — POCT INR: INR: 3.6 — AB (ref 2.0–3.0)

## 2018-06-25 NOTE — Patient Instructions (Signed)
Description   Skip your Coumadin today, then continue 1 tablet daily except 1 1/2 tablets on Tuesdays, Thursdays and Saturdays Recheck in 3 weeks

## 2018-07-16 ENCOUNTER — Ambulatory Visit (INDEPENDENT_AMBULATORY_CARE_PROVIDER_SITE_OTHER): Payer: Medicare Other | Admitting: *Deleted

## 2018-07-16 DIAGNOSIS — Z5181 Encounter for therapeutic drug level monitoring: Secondary | ICD-10-CM | POA: Diagnosis not present

## 2018-07-16 DIAGNOSIS — I4891 Unspecified atrial fibrillation: Secondary | ICD-10-CM | POA: Diagnosis not present

## 2018-07-16 LAB — POCT INR: INR: 2.6 (ref 2.0–3.0)

## 2018-07-16 NOTE — Patient Instructions (Signed)
Continue coumadin 1 tablet daily except 1 1/2 tablets on Tuesdays, Thursdays and Saturdays Recheck in 4 weeks

## 2018-07-29 ENCOUNTER — Inpatient Hospital Stay: Payer: Medicare Other | Attending: Internal Medicine

## 2018-07-29 ENCOUNTER — Telehealth: Payer: Self-pay | Admitting: Internal Medicine

## 2018-07-29 ENCOUNTER — Other Ambulatory Visit: Payer: Self-pay

## 2018-07-29 ENCOUNTER — Inpatient Hospital Stay (HOSPITAL_BASED_OUTPATIENT_CLINIC_OR_DEPARTMENT_OTHER): Payer: Medicare Other | Admitting: Physician Assistant

## 2018-07-29 ENCOUNTER — Encounter: Payer: Self-pay | Admitting: Physician Assistant

## 2018-07-29 VITALS — BP 133/91 | HR 78 | Temp 97.6°F | Resp 20 | Ht 63.0 in | Wt 107.5 lb

## 2018-07-29 DIAGNOSIS — C3411 Malignant neoplasm of upper lobe, right bronchus or lung: Secondary | ICD-10-CM | POA: Diagnosis not present

## 2018-07-29 LAB — CBC WITH DIFFERENTIAL (CANCER CENTER ONLY)
Abs Immature Granulocytes: 0.01 10*3/uL (ref 0.00–0.07)
BASOS ABS: 0 10*3/uL (ref 0.0–0.1)
Basophils Relative: 1 %
Eosinophils Absolute: 0.2 10*3/uL (ref 0.0–0.5)
Eosinophils Relative: 4 %
HCT: 45.7 % (ref 36.0–46.0)
Hemoglobin: 14.5 g/dL (ref 12.0–15.0)
Immature Granulocytes: 0 %
Lymphocytes Relative: 28 %
Lymphs Abs: 1.1 10*3/uL (ref 0.7–4.0)
MCH: 29.8 pg (ref 26.0–34.0)
MCHC: 31.7 g/dL (ref 30.0–36.0)
MCV: 93.8 fL (ref 80.0–100.0)
Monocytes Absolute: 0.7 10*3/uL (ref 0.1–1.0)
Monocytes Relative: 17 %
NEUTROS ABS: 2 10*3/uL (ref 1.7–7.7)
NEUTROS PCT: 50 %
Platelet Count: 246 10*3/uL (ref 150–400)
RBC: 4.87 MIL/uL (ref 3.87–5.11)
RDW: 13.7 % (ref 11.5–15.5)
WBC Count: 3.9 10*3/uL — ABNORMAL LOW (ref 4.0–10.5)
nRBC: 0 % (ref 0.0–0.2)

## 2018-07-29 LAB — CMP (CANCER CENTER ONLY)
ALT: 16 U/L (ref 0–44)
AST: 24 U/L (ref 15–41)
Albumin: 3.7 g/dL (ref 3.5–5.0)
Alkaline Phosphatase: 92 U/L (ref 38–126)
Anion gap: 9 (ref 5–15)
BUN: 19 mg/dL (ref 8–23)
CO2: 28 mmol/L (ref 22–32)
Calcium: 9.2 mg/dL (ref 8.9–10.3)
Chloride: 102 mmol/L (ref 98–111)
Creatinine: 0.84 mg/dL (ref 0.44–1.00)
GFR, Estimated: >60 mL/min (ref >60)
Glucose, Bld: 88 mg/dL (ref 70–99)
Potassium: 4.3 mmol/L (ref 3.5–5.1)
Sodium: 139 mmol/L (ref 135–145)
Total Bilirubin: 0.5 mg/dL (ref 0.3–1.2)
Total Protein: 7.4 g/dL (ref 6.5–8.1)

## 2018-07-29 NOTE — Telephone Encounter (Signed)
Gave patient appointment schedule for April. Patient declined avs report. Central radiology will call re scan. Patient will have lab/fu same day at [facility].

## 2018-07-29 NOTE — Progress Notes (Signed)
[facility] OFFICE PROGRESS NOTE  [name], MD [place] [address]  DIAGNOSIS: Recurrent non-small cell lung cancer presented with bilateral lung nodules in October 2016 initially diagnosed as a stage IA adenocarcinoma with positive EGFR mutation with deletion in exon 27 in December 2012.  PRIOR THERAPY: Status post bronchoscopy with right VATS and right upper lobectomy with lymph node dissection under the care of Dr. Servando Snare on 05/31/2011.  CURRENT THERAPY: Iressa 250 mg by mouth daily started 04/17/2015, status post 39 months of treatment.  INTERVAL HISTORY: Natalie Carlson 85 y.o. female returns to the clinic today for a follow-up visit accompanied by her friend.  The patient is feeling well today without any concerning complaints.  She continues to tolerate her treatment well without any adverse side effects.  She denies any fever, chills, night sweats, or weight loss.  She denies chest pain, shortness of breath, cough, or hemoptysis.  She denies any nausea, vomiting, diarrhea, or constipation.  She denies any headaches but endorses visual changes secondary to her wet macular degeneration.  She denies any rashes or skin changes.  She is here today for evaluation and repeat blood work.  MEDICAL HISTORY: Past Medical History:  Diagnosis Date  . Arthritis   . Atrial fibrillation ([place])   . Cataract of both eyes   . Chronic back pain   . Chronic diarrhea   . Colitis   . Hemorrhoids   . History of colon polyps   . History of migraines   . Hyperlipidemia   . Hypothyroidism   . Macular degeneration, dry   . Malignant neoplasm of right upper lobe of lung (HCC)    Stage 1A  EGFR positive  Deletion in exon 19.  ALK-negative  Right Upper Lobe Lung Mass  SUV 2.9 on PET Status post VATS and right upper lobe lobectomy 05/31/2011  . Mucus in stool   . Nephrolithiasis    1994 - passed on its on    ALLERGIES:  is allergic to doxycycline; entocort ec [budesonide];  neurontin [gabapentin]; vicodin [hydrocodone-acetaminophen]; adhesive [tape]; amoxicillin; chocolate; codeine; levaquin [levofloxacin in d5w]; morphine and related; naprosyn [naproxen]; penicillins; prednisone; tramadol; and uncoded nonscreenable allergen.  MEDICATIONS:  Current Outpatient Medications  Medication Sig Dispense Refill  . Calcium Carbonate (CALCIUM 600 PO) Take 1 tablet by mouth 2 (two) times daily.    . diclofenac sodium (VOLTAREN) 1 % GEL Apply 1 application topically 2 (two) times daily as needed (for neck pain).     Marland Kitchen diltiazem (CARTIA XT) 180 MG 24 hr capsule Take 1 capsule (180 mg total) by mouth daily. 90 capsule 2  . furosemide (LASIX) 20 MG tablet TAKE 1 TABLET BY MOUTH EVERY DAY AS NEEDED FOR FLUID 90 tablet 1  . gefitinib (IRESSA) 250 MG tablet Take 1 tablet (250 mg total) by mouth daily. 30 tablet 5  . LUTEIN PO Take 1 capsule by mouth daily. Reported on 10/27/2015    . magnesium 30 MG tablet Take 30 mg by mouth at bedtime.     . mesalamine (APRISO) 0.375 g 24 hr capsule TAKE 2 CAPSULE BY MOUTH TWICE DAILY 120 capsule 11  . Multiple Vitamins-Minerals (AIRBORNE) CHEW Chew 1 tablet by mouth daily.    . Multiple Vitamins-Minerals (HAIR/SKIN/NAILS) TABS Take 1 tablet by mouth daily.    . Multiple Vitamins-Minerals (PRESERVISION AREDS 2 PO) Take 1 tablet by mouth 2 (two) times daily with a meal.    . NON FORMULARY Eye  In jection every 8-10weeks.    . NON FORMULARY Place 1 drop into both eyes. Thera Tears    . Omega-3 Fatty Acids (FISH OIL) 1200 MG CAPS Take 1,200 mg by mouth daily.    . potassium chloride (K-DUR,KLOR-CON) 10 MEQ tablet TAKE 1 TABLET BY MOUTH EVERY DAY 90 tablet 1  . warfarin (COUMADIN) 5 MG tablet TAKE 1 TABLET BY MOUTH DAILY EXCEPT ONE AND ONE-HALF TABLETS ON TUESDAYS AND FRIDAYS 40 tablet 3  . HYDROmorphone (DILAUDID) 4 MG tablet Take 1 tablet (4 mg total) by mouth every 6 (six) hours as needed for severe pain. (Patient not taking: Reported on 06/17/2018) 20  tablet 0  . loperamide (IMODIUM A-D) 2 MG tablet Take 1 mg by mouth daily as needed for diarrhea or loose stools.    . ondansetron (ZOFRAN ODT) 4 MG disintegrating tablet Take 1 tablet (4 mg total) by mouth every 8 (eight) hours as needed. (Patient not taking: Reported on 06/17/2018) 10 tablet 1   No current facility-administered medications for this visit.     SURGICAL HISTORY:  Past Surgical History:  Procedure Laterality Date  . Bladder tack  1996  . CARDIOVERSION  11/02/09   x 2  . CATARACT EXTRACTION  2010/2011  . COLONOSCOPY  3 YRS AGO  . COLONOSCOPY  03/06/2011   Procedure: COLONOSCOPY;  Surgeon: Rogene Houston, MD;  [facility];  Service: Endoscopy;  Laterality: N/A;  7:30 am  . ESOPHAGOGASTRODUODENOSCOPY    . FLEXIBLE SIGMOIDOSCOPY N/A 10/04/2012   Procedure: FLEXIBLE SIGMOIDOSCOPY;  Surgeon: Rogene Houston, MD;  [facility];  Service: Endoscopy;  Laterality: N/A;  11:00-moved to 1015 Ann to notify pt  . THYROID SURGERY  1961  . TONSILLECTOMY    . VIDEO BRONCHOSCOPY  05/31/2011   Procedure: VIDEO BRONCHOSCOPY;  Surgeon: Grace Isaac, MD;  Location[facility] OR;  Service: Thoracic;  Laterality: N/A;    REVIEW OF SYSTEMS:   Review of Systems  Constitutional: Negative for appetite change, chills, fatigue, fever and unexpected weight change.  HENT:  Positive for nosebleeds which she gets occasionally. Negative for mouth sores, sore throat and trouble swallowing.   Eyes: Positive for blurry vision secondary to macular degeneration. Negative for icterus.  Respiratory: Negative for cough, hemoptysis, shortness of breath and wheezing.   Cardiovascular: Negative for chest pain and leg swelling.  Gastrointestinal: Negative for abdominal pain, constipation, diarrhea, nausea and vomiting.  Genitourinary: Negative for bladder incontinence, difficulty urinating, dysuria, frequency and hematuria.   Musculoskeletal: Negative for back pain, gait problem, neck pain and  neck stiffness.  Skin: Negative for itching and rash.  Neurological: Negative for dizziness, extremity weakness, gait problem, headaches, light-headedness and seizures.  Hematological: Negative for adenopathy. Does not bruise/bleed easily.  Psychiatric/Behavioral: Negative for confusion, depression and sleep disturbance. The patient is not nervous/anxious.     PHYSICAL EXAMINATION:  Blood pressure (!) 133/91, pulse 78, temperature 97.6 F (36.4 C), temperature source Oral, resp. rate 20, height 5' 3"  (1.6 m), weight 107 lb 8 oz (48.8 kg), SpO2 98 %.  ECOG PERFORMANCE STATUS: 0 - Asymptomatic  Physical Exam  Constitutional: Oriented to person, place, and time and well-developed, well-nourished, and in no distress. No distress.  HENT:  Head: Normocephalic and atraumatic.  Mouth/Throat: Oropharynx is clear and moist. No oropharyngeal exudate.  Eyes: Conjunctivae are normal. Right eye exhibits no discharge. Left eye exhibits no discharge. No scleral icterus.  Neck: Normal range of motion. Neck supple.  Cardiovascular: Normal  rate, regular rhythm, normal heart sounds and intact distal pulses.   Pulmonary/Chest: Effort normal and breath sounds normal. No respiratory distress. No wheezes. No rales.  Abdominal: Soft. Bowel sounds are normal. Exhibits no distension and no mass. There is no tenderness.  Musculoskeletal: Normal range of motion. Exhibits no edema.  Lymphadenopathy:    No cervical adenopathy.  Neurological: Alert and oriented to person, place, and time. Exhibits normal muscle tone. Gait normal. Coordination normal.  Skin: Skin is warm and dry. No rash noted. Not diaphoretic. No erythema. No pallor.  Psychiatric: Mood, memory and judgment normal.  Vitals reviewed.  LABORATORY DATA: Lab Results  Component Value Date   WBC 3.9 (L) 07/29/2018   HGB 14.5 07/29/2018   HCT 45.7 07/29/2018   MCV 93.8 07/29/2018   PLT 246 07/29/2018      Chemistry      Component Value Date/Time    NA 139 07/29/2018 1041   NA 141 05/14/2017 1304   K 4.3 07/29/2018 1041   K 4.3 05/14/2017 1304   CL 102 07/29/2018 1041   CO2 28 07/29/2018 1041   CO2 30 (H) 05/14/2017 1304   BUN 19 07/29/2018 1041   BUN 19.0 05/14/2017 1304   CREATININE 0.84 07/29/2018 1041   CREATININE 0.8 05/14/2017 1304      Component Value Date/Time   CALCIUM 9.2 07/29/2018 1041   CALCIUM 9.1 05/14/2017 1304   ALKPHOS 92 07/29/2018 1041   ALKPHOS 86 05/14/2017 1304   AST 24 07/29/2018 1041   AST 25 05/14/2017 1304   ALT 16 07/29/2018 1041   ALT 15 05/14/2017 1304   BILITOT 0.5 07/29/2018 1041   BILITOT 0.57 05/14/2017 1304       RADIOGRAPHIC STUDIES:  No results found.   ASSESSMENT/PLAN:  This a very pleasant 85 year old Caucasian female with recurrent non-small cell lung cancer, adenocarcinoma.  She presented with bilateral lung nodules in October 2016 and was initially diagnosed as stage IA in December 2012. She has a positive EGFR mutation with an exon 64.   The patient is currently undergoing treatment with Iressa 250 mg p.o. daily.  She is status post 39 months of treatment and continues to tolerate it well without any adverse effects.  Patient was seen with Dr. Julien Nordmann today.  Labs were reviewed.  Recommend she continue treatment with Iressa at the same dose.   I have arranged for a repeat chest CT scan to be performed prior to her next visit. I will see her back in 6 weeks for evaluation and to review the scan results.   The patient was advised to call immediately if she has any concerning symptoms in the interval. The patient voices understanding of current disease status and treatment options and is in agreement with the current care plan. All questions were answered. The patient knows to call the clinic with any problems, questions or concerns. We can certainly see the patient much sooner if necessary   Orders Placed This Encounter  Procedures  . CT Chest W Contrast    Standing  Status:   Future    Standing Expiration Date:   07/29/2019    Order Specific Question:   ** REASON FOR EXAM (FREE TEXT)    Answer:   Restaging CT scan    Order Specific Question:   If indicated for the ordered procedure, I authorize the administration of contrast media per Radiology protocol    Answer:   Yes    Order Specific Question:   Preferred  imaging location?    Answer:   [facility]    Order Specific Question:   Radiology Contrast Protocol - do NOT remove file path    Answer:   [email or web address]  . CBC with Differential ([facility] Only)    Standing Status:   Future    Standing Expiration Date:   07/29/2019  . CMP ([facility] only)    Standing Status:   Future    Standing Expiration Date:   07/29/2019     Tobe Sos Heilingoetter, PA-C 07/29/18  ADDENDUM: Hematology/Oncology Attending: I had a face-to-face encounter with the patient today.  I recommended her care plan.  This is a very pleasant 85 years old white female with recurrent non-small cell lung cancer, adenocarcinoma with positive EGFR mutation in exon 19.  She is currently on treatment with Iressa status post 39 months of treatment.  The patient has been tolerating this treatment well with no concerning complaints. I recommended for the patient to continue her current treatment with Iressa with the same dose. We will see her back for follow-up visit and 6 weeks for evaluation after repeating CT scan of the chest for restaging of her disease. The patient was advised to call immediately if she has any concerning symptoms in the interval.  Disclaimer: This note was dictated with voice recognition software. Similar sounding words can inadvertently be transcribed and may be missed upon review. Eilleen Kempf, MD 07/29/18

## 2018-08-12 ENCOUNTER — Other Ambulatory Visit: Payer: Self-pay

## 2018-08-13 ENCOUNTER — Ambulatory Visit (INDEPENDENT_AMBULATORY_CARE_PROVIDER_SITE_OTHER): Payer: Medicare Other | Admitting: *Deleted

## 2018-08-13 DIAGNOSIS — I4891 Unspecified atrial fibrillation: Secondary | ICD-10-CM | POA: Diagnosis not present

## 2018-08-13 DIAGNOSIS — Z5181 Encounter for therapeutic drug level monitoring: Secondary | ICD-10-CM

## 2018-08-13 LAB — POCT INR: INR: 3.5 — AB (ref 2.0–3.0)

## 2018-08-13 NOTE — Patient Instructions (Signed)
Hold coumadin tonight then resume 1 tablet daily except 1 1/2 tablets on Tuesdays, Thursdays and Saturdays Recheck in 5 weeks

## 2018-08-19 DIAGNOSIS — H353231 Exudative age-related macular degeneration, bilateral, with active choroidal neovascularization: Secondary | ICD-10-CM | POA: Diagnosis not present

## 2018-09-02 ENCOUNTER — Other Ambulatory Visit: Payer: Self-pay | Admitting: Cardiology

## 2018-09-04 ENCOUNTER — Ambulatory Visit (HOSPITAL_COMMUNITY)
Admission: RE | Admit: 2018-09-04 | Discharge: 2018-09-04 | Disposition: A | Discharge status: Home / Self Care | Payer: Medicare Other | Source: Ambulatory Visit | Attending: Physician Assistant | Admitting: Physician Assistant

## 2018-09-04 ENCOUNTER — Telehealth: Payer: Self-pay | Admitting: Pharmacist

## 2018-09-04 ENCOUNTER — Other Ambulatory Visit: Payer: Self-pay

## 2018-09-04 DIAGNOSIS — C3411 Malignant neoplasm of upper lobe, right bronchus or lung: Secondary | ICD-10-CM | POA: Diagnosis not present

## 2018-09-04 DIAGNOSIS — R911 Solitary pulmonary nodule: Secondary | ICD-10-CM | POA: Diagnosis not present

## 2018-09-04 DIAGNOSIS — Z859 Personal history of malignant neoplasm, unspecified: Secondary | ICD-10-CM | POA: Diagnosis not present

## 2018-09-04 MED ORDER — IOHEXOL 300 MG/ML  SOLN
75.0000 mL | Freq: Once | INTRAMUSCULAR | Status: AC | PRN
  Administered 2018-09-04: 15:00:00 75 mL via INTRAVENOUS

## 2018-09-04 NOTE — Telephone Encounter (Signed)
Oral Chemotherapy Pharmacist Encounter   Spoke with patient today to follow up regarding patient's oral chemotherapy medication: Iressa (gefitinib) for the treatment of metastatic non small cell lung cancer, with known EGFR mutation (exon 19 deletion), until disease progression or unacceptable toxicity.  Original Start date of oral chemotherapy: 04/17/2015  Pt is doing well today  Pt reports 0 tablets/doses of Iressa 239m tablets, 1 tablet taken by mouth, without regard to food, missed in the last month.  Patient states she takes her Iressa before breakfast every morning.  Patient continues to report the following side effects: dry skin, itchiness, intermittent diarrhea, cavities, and macular degeneration. She has been managing them all very well since therapy initiation.  Pertinent labs reviewed: [place] continued treatment.  Other Issues:  Patient had been enrolled for foundation copayment grant at the end of 2019 to help cover out of pocket expenses at the pharmacy for her Iressa. We received notification after grant was secured that patient would be automatically re-enrolled to receive Iressa from [facility] compassionate use program for calendar year [place] the [facility] [place] Patient has now received communication from foundation that grant will expire as there has been no activity on account. Patient has no issues receiving her Iressa from [place]. We will not use foundation grant, we will let it expire. We will work with patient at the end of this year to secure a different copayment grant at that time or enroll [facility] for 2021. Patient understands and is in agreement with this plan.  [facility]. [facility] for lab appt and follow-up with Dr. MJulien Nordmannon 4/27. [facility] Her follow-up appt with Dr. MJulien Nordmannis  scheduled as a WebEx visit. She has not been contacted about how she will be present for this visit. I will reach out to collaborative practice RN about this.  Patient knows to call the office with questions or concerns. Oral Oncology Clinic will continue to follow.  JJohny Drilling PharmD, BCPS, BCOP  09/04/2018  10:54 AM  Oral Oncology Clinic [phone]

## 2018-09-09 ENCOUNTER — Inpatient Hospital Stay: Payer: Medicare Other

## 2018-09-09 ENCOUNTER — Inpatient Hospital Stay: Payer: Medicare Other | Attending: Internal Medicine | Admitting: Internal Medicine

## 2018-09-09 ENCOUNTER — Encounter: Payer: Self-pay | Admitting: Internal Medicine

## 2018-09-09 ENCOUNTER — Other Ambulatory Visit: Payer: Self-pay

## 2018-09-09 VITALS — BP 138/75 | HR 77 | Temp 97.6°F | Resp 18 | Ht 63.0 in | Wt 107.9 lb

## 2018-09-09 DIAGNOSIS — I4891 Unspecified atrial fibrillation: Secondary | ICD-10-CM | POA: Diagnosis not present

## 2018-09-09 DIAGNOSIS — Z79899 Other long term (current) drug therapy: Secondary | ICD-10-CM | POA: Insufficient documentation

## 2018-09-09 DIAGNOSIS — C3411 Malignant neoplasm of upper lobe, right bronchus or lung: Secondary | ICD-10-CM | POA: Diagnosis not present

## 2018-09-09 DIAGNOSIS — E042 Nontoxic multinodular goiter: Secondary | ICD-10-CM | POA: Insufficient documentation

## 2018-09-09 DIAGNOSIS — Z791 Long term (current) use of non-steroidal anti-inflammatories (NSAID): Secondary | ICD-10-CM | POA: Insufficient documentation

## 2018-09-09 DIAGNOSIS — Z7901 Long term (current) use of anticoagulants: Secondary | ICD-10-CM | POA: Insufficient documentation

## 2018-09-09 DIAGNOSIS — E785 Hyperlipidemia, unspecified: Secondary | ICD-10-CM | POA: Diagnosis not present

## 2018-09-09 DIAGNOSIS — Z5181 Encounter for therapeutic drug level monitoring: Secondary | ICD-10-CM

## 2018-09-09 DIAGNOSIS — E039 Hypothyroidism, unspecified: Secondary | ICD-10-CM | POA: Diagnosis not present

## 2018-09-09 LAB — CBC WITH DIFFERENTIAL (CANCER CENTER ONLY)
Abs Immature Granulocytes: 0.01 10*3/uL (ref 0.00–0.07)
Basophils Absolute: 0 10*3/uL (ref 0.0–0.1)
Basophils Relative: 1 %
Eosinophils Absolute: 0.2 10*3/uL (ref 0.0–0.5)
Eosinophils Relative: 4 %
HCT: 43.5 % (ref 36.0–46.0)
Hemoglobin: 14 g/dL (ref 12.0–15.0)
Immature Granulocytes: 0 %
Lymphocytes Relative: 25 %
Lymphs Abs: 1.2 10*3/uL (ref 0.7–4.0)
MCH: 30.2 pg (ref 26.0–34.0)
MCHC: 32.2 g/dL (ref 30.0–36.0)
MCV: 93.8 fL (ref 80.0–100.0)
Monocytes Absolute: 0.8 10*3/uL (ref 0.1–1.0)
Monocytes Relative: 17 %
Neutro Abs: 2.6 10*3/uL (ref 1.7–7.7)
Neutrophils Relative %: 53 %
Platelet Count: 255 10*3/uL (ref 150–400)
RBC: 4.64 MIL/uL (ref 3.87–5.11)
RDW: 13.8 % (ref 11.5–15.5)
WBC Count: 4.9 10*3/uL (ref 4.0–10.5)
nRBC: 0 % (ref 0.0–0.2)

## 2018-09-09 LAB — CMP (CANCER CENTER ONLY)
ALT: 13 U/L (ref 0–44)
AST: 23 U/L (ref 15–41)
Albumin: 3.5 g/dL (ref 3.5–5.0)
Alkaline Phosphatase: 101 U/L (ref 38–126)
Anion gap: 9 (ref 5–15)
BUN: 13 mg/dL (ref 8–23)
CO2: 27 mmol/L (ref 22–32)
Calcium: 9 mg/dL (ref 8.9–10.3)
Chloride: 103 mmol/L (ref 98–111)
Creatinine: 0.77 mg/dL (ref 0.44–1.00)
GFR, Est AFR Am: >60 mL/min (ref >60)
GFR, Estimated: >60 mL/min (ref >60)
Glucose, Bld: 93 mg/dL (ref 70–99)
Potassium: 4.2 mmol/L (ref 3.5–5.1)
Sodium: 139 mmol/L (ref 135–145)
Total Bilirubin: 0.5 mg/dL (ref 0.3–1.2)
Total Protein: 7.6 g/dL (ref 6.5–8.1)

## 2018-09-09 NOTE — Progress Notes (Signed)
[place] Telephone:(336) [phone]   Fax:(336) [phone]  OFFICE PROGRESS NOTE  [name], MD [place] [address]  DIAGNOSIS: Recurrent non-small cell lung cancer presented with bilateral lung nodules in October 2016 initially diagnosed as a stage IA adenocarcinoma with positive EGFR mutation with deletion in exon 85 in December 2012.  PRIOR THERAPY: Status post bronchoscopy with right VATS and right upper lobectomy with lymph node dissection under the care of Dr. Servando Snare on 05/31/2011.  CURRENT THERAPY: Iressa 250 mg by mouth daily started 04/17/2015, status post 40 months of treatment.  INTERVAL HISTORY: Natalie Carlson 85 y.o. female returns to the clinic today for follow-up visit.  The patient is feeling fine today with no concerning complaints.  She has been tolerating her treatment with Iressa fairly well.  She denied having any skin rash or diarrhea.  She has no chest pain, shortness of breath, cough or hemoptysis.  She has no nausea, vomiting or constipation.  The patient denied having any significant weight loss or night sweats.  She denied having any fever or chills.  She has no headache or visual changes.  She had repeat CT scan of the chest performed recently and she is here for evaluation and discussion of her scan results.   MEDICAL HISTORY: Past Medical History:  Diagnosis Date  . Arthritis   . Atrial fibrillation ([place])   . Cataract of both eyes   . Chronic back pain   . Chronic diarrhea   . Colitis   . Hemorrhoids   . History of colon polyps   . History of migraines   . Hyperlipidemia   . Hypothyroidism   . Macular degeneration, dry   . Malignant neoplasm of right upper lobe of lung (HCC)    Stage 1A  EGFR positive  Deletion in exon 19.  ALK-negative  Right Upper Lobe Lung Mass  SUV 2.9 on PET Status post VATS and right upper lobe lobectomy 05/31/2011  . Mucus in stool   . Nephrolithiasis    1994 - passed on its on     ALLERGIES:  is allergic to doxycycline; entocort ec [budesonide]; neurontin [gabapentin]; vicodin [hydrocodone-acetaminophen]; adhesive [tape]; amoxicillin; chocolate; codeine; levaquin [levofloxacin in d5w]; morphine and related; naprosyn [naproxen]; penicillins; prednisone; tramadol; and uncoded nonscreenable allergen.  MEDICATIONS:  Current Outpatient Medications  Medication Sig Dispense Refill  . Calcium Carbonate (CALCIUM 600 PO) Take 1 tablet by mouth 2 (two) times daily.    . diclofenac sodium (VOLTAREN) 1 % GEL Apply 1 application topically 2 (two) times daily as needed (for neck pain).     Marland Kitchen diltiazem (CARTIA XT) 180 MG 24 hr capsule Take 1 capsule (180 mg total) by mouth daily. 90 capsule 2  . furosemide (LASIX) 20 MG tablet TAKE 1 TABLET BY MOUTH EVERY DAY AS NEEDED FOR FLUID 90 tablet 1  . gefitinib (IRESSA) 250 MG tablet Take 1 tablet (250 mg total) by mouth daily. 30 tablet 5  . HYDROmorphone (DILAUDID) 4 MG tablet Take 1 tablet (4 mg total) by mouth every 6 (six) hours as needed for severe pain. (Patient not taking: Reported on 06/17/2018) 20 tablet 0  . loperamide (IMODIUM A-D) 2 MG tablet Take 1 mg by mouth daily as needed for diarrhea or loose stools.    . LUTEIN PO Take 1 capsule by mouth daily. Reported on 10/27/2015    . magnesium 30 MG tablet Take 30 mg by mouth at bedtime.     Marland Kitchen  mesalamine (APRISO) 0.375 g 24 hr capsule TAKE 2 CAPSULE BY MOUTH TWICE DAILY 120 capsule 11  . Multiple Vitamins-Minerals (AIRBORNE) CHEW Chew 1 tablet by mouth daily.    . Multiple Vitamins-Minerals (HAIR/SKIN/NAILS) TABS Take 1 tablet by mouth daily.    . Multiple Vitamins-Minerals (PRESERVISION AREDS 2 PO) Take 1 tablet by mouth 2 (two) times daily with a meal.    . NON FORMULARY Eye In jection every 8-10weeks.    . NON FORMULARY Place 1 drop into both eyes. Thera Tears    . Omega-3 Fatty Acids (FISH OIL) 1200 MG CAPS Take 1,200 mg by mouth daily.    . ondansetron (ZOFRAN ODT) 4 MG disintegrating  tablet Take 1 tablet (4 mg total) by mouth every 8 (eight) hours as needed. (Patient not taking: Reported on 06/17/2018) 10 tablet 1  . potassium chloride (K-DUR,KLOR-CON) 10 MEQ tablet TAKE 1 TABLET BY MOUTH EVERY DAY 90 tablet 1  . warfarin (COUMADIN) 5 MG tablet Take 1 tablet daily except 1 1/2 tablets on Tuesdays, Thursdays and Saturdays or as directed 40 tablet 3   No current facility-administered medications for this visit.     SURGICAL HISTORY:  Past Surgical History:  Procedure Laterality Date  . Bladder tack  1996  . CARDIOVERSION  11/02/09   x 2  . CATARACT EXTRACTION  2010/2011  . COLONOSCOPY  3 YRS AGO  . COLONOSCOPY  03/06/2011   Procedure: COLONOSCOPY;  Surgeon: Rogene Houston, MD;  [facility];  Service: Endoscopy;  Laterality: N/A;  7:30 am  . ESOPHAGOGASTRODUODENOSCOPY    . FLEXIBLE SIGMOIDOSCOPY N/A 10/04/2012   Procedure: FLEXIBLE SIGMOIDOSCOPY;  Surgeon: Rogene Houston, MD;  [facility];  Service: Endoscopy;  Laterality: N/A;  11:00-moved to 1015 Ann to notify pt  . THYROID SURGERY  1961  . TONSILLECTOMY    . VIDEO BRONCHOSCOPY  05/31/2011   Procedure: VIDEO BRONCHOSCOPY;  Surgeon: Grace Isaac, MD;  Location: [facility] OR;  Service: Thoracic;  Laterality: N/A;    REVIEW OF SYSTEMS:  Constitutional: negative Eyes: negative Ears, nose, mouth, throat, and face: negative Respiratory: negative Cardiovascular: negative Gastrointestinal: negative Genitourinary:negative Integument/breast: negative Hematologic/lymphatic: negative Musculoskeletal:negative Neurological: negative Behavioral/Psych: negative Endocrine: negative Allergic/Immunologic: negative   PHYSICAL EXAMINATION: General appearance: alert, cooperative and no distress Head: Normocephalic, without obvious abnormality, atraumatic Neck: no adenopathy, no JVD, supple, symmetrical, trachea midline and thyroid not enlarged, symmetric, no tenderness/mass/nodules Lymph nodes: Cervical,  supraclavicular, and axillary nodes normal. Resp: clear to auscultation bilaterally Back: symmetric, no curvature. ROM normal. No CVA tenderness. Cardio: regular rate and rhythm, S1, S2 normal, no murmur, click, rub or gallop GI: soft, non-tender; bowel sounds normal; no masses,  no organomegaly Extremities: extremities normal, atraumatic, no cyanosis or edema Neurologic: Alert and oriented X 3, normal strength and tone. Normal symmetric reflexes. Normal coordination and gait  ECOG PERFORMANCE STATUS: 0 - Asymptomatic  Blood pressure 138/75, pulse 77, temperature 97.6 F (36.4 C), temperature source Oral, resp. rate 18, height 5' 3"  (1.6 m), weight 107 lb 14.4 oz (48.9 kg), SpO2 97 %.  LABORATORY DATA: Lab Results  Component Value Date   WBC 4.9 09/09/2018   HGB 14.0 09/09/2018   HCT 43.5 09/09/2018   MCV 93.8 09/09/2018   PLT 255 09/09/2018      Chemistry      Component Value Date/Time   NA 139 07/29/2018 1041   NA 141 05/14/2017 1304   K 4.3 07/29/2018 1041   K 4.3 05/14/2017  1304   CL 102 07/29/2018 1041   CO2 28 07/29/2018 1041   CO2 30 (H) 05/14/2017 1304   BUN 19 07/29/2018 1041   BUN 19.0 05/14/2017 1304   CREATININE 0.84 07/29/2018 1041   CREATININE 0.8 05/14/2017 1304      Component Value Date/Time   CALCIUM 9.2 07/29/2018 1041   CALCIUM 9.1 05/14/2017 1304   ALKPHOS 92 07/29/2018 1041   ALKPHOS 86 05/14/2017 1304   AST 24 07/29/2018 1041   AST 25 05/14/2017 1304   ALT 16 07/29/2018 1041   ALT 15 05/14/2017 1304   BILITOT 0.5 07/29/2018 1041   BILITOT 0.57 05/14/2017 1304       RADIOGRAPHIC STUDIES: Ct Chest W Contrast  Result Date: 09/04/2018 CLINICAL DATA:  85 year old female with a history of recurrent neoplasm EXAM: CT CHEST WITH CONTRAST TECHNIQUE: Multidetector CT imaging of the chest was performed during intravenous contrast administration. CONTRAST:  74m OMNIPAQUE IOHEXOL 300 MG/ML  SOLN COMPARISON:  Multiple prior most recent 05/03/2018  FINDINGS: Cardiovascular: Cardiomegaly again demonstrated. No pericardial fluid/thickening. Calcifications of the left anterior descending coronary artery and the right coronary artery. Course caliber and contour of the thoracic aorta is unchanged. Greatest diameter of the ascending aorta measures 37 mm. No pericardial fluid/thickening. Mild calcifications of the aorta. Branch vessels are patent. Unremarkable diameter of the main pulmonary artery. Mediastinum/Nodes: Redemonstration of right-sided thyroid nodules and subcentimeter left-sided thyroid nodule. Unremarkable appearance of the thoracic esophagus. Small lymph nodes of the mediastinum, none of which are enlarged and overall unchanged. Lungs/Pleura: Subpleural reticulation of the bilateral lungs, with regions of linear scarring/atelectasis such as adjacent to the right fissure, and at the periphery of the left lower lobe, similar to the comparison. Unchanged nodule at the left lung apex, previously 7 mm, currently 7 mm. No pneumothorax, pleural effusion, or confluent airspace disease. Similar appearance of early bronchiectasis without bronchial wall thickening. Partially calcified granulomas of the right lower lobe. Upper Abdomen: No acute finding of the upper abdomen Musculoskeletal: No acute displaced fracture. Degenerative changes of the spine with no bony canal narrowing. Similar appearance of scoliotic curvature. IMPRESSION: No acute finding. Similar appearance of chronic lung changes. Left apical 7 mm nodule, unchanged. Aortic atherosclerosis and associated coronary artery disease. Aortic Atherosclerosis (ICD10-I70.0). Electronically Signed   By: JCorrie MckusickD.O.   On: 09/04/2018 16:12    ASSESSMENT AND PLAN:  This is a very pleasant 85years old white female with recurrent non-small cell lung cancer, adenocarcinoma with positive EGFR mutation. The patient has been on treatment with Iressa 250 mg p.o. daily for the last 40 months. The patient  has been tolerating this treatment well with no concerning adverse effects. She had repeat CT scan of the chest performed recently.  I personally and independently reviewed the scans and discussed the results with the patient today. Her scan showed no concerning findings for disease progression. I recommended for the patient to continue her current treatment with Iressa 250 mg p.o. daily. I will see the patient back for follow-up visit in 2 months for evaluation with repeat blood work. She was advised to call immediately if she has any concerning symptoms in the interval. The patient voices understanding of current disease status and treatment options and is in agreement with the current care plan. All questions were answered. The patient knows to call the clinic with any problems, questions or concerns. We can certainly see the patient much sooner if necessary. I spent 15 minutes counseling the patient  face to face. The total time spent in the appointment was 25 minutes.   Disclaimer: This note was dictated with voice recognition software. Similar sounding words can inadvertently be transcribed and may not be corrected upon review.

## 2018-09-10 ENCOUNTER — Telehealth: Payer: Self-pay | Admitting: Internal Medicine

## 2018-09-10 NOTE — Telephone Encounter (Signed)
Tried to reach regarding schedule

## 2018-09-16 ENCOUNTER — Telehealth: Payer: Self-pay | Admitting: *Deleted

## 2018-09-16 NOTE — Telephone Encounter (Signed)
°  _____________   [id number] Pre-Screening Questions:    Do you currently have a fever?  (yes = cancel and refer to pcp for e-visit)    NO  Have you recently travelled on a cruise, internationally, or to [place], [place], [place], [place], [place], or [place], [place] [employer]) ?  (yes = cancel, stay home, monitor symptoms, and contact pcp or initiate e-visit if symptoms develop)   NO  Have you been in contact with someone that is currently pending confirmation of Covid19 testing or has been confirmed to have the Covid19 virus?   (yes = cancel, stay home, away from tested individual, monitor symptoms, and contact pcp or initiate e-visit if symptoms develop)   NO  Are you currently experiencing fatigue or cough? (yes = pt should be prepared to have a mask placed at the time of their visit).   NO

## 2018-09-17 ENCOUNTER — Ambulatory Visit (INDEPENDENT_AMBULATORY_CARE_PROVIDER_SITE_OTHER): Payer: Medicare Other | Admitting: *Deleted

## 2018-09-17 ENCOUNTER — Other Ambulatory Visit: Payer: Self-pay

## 2018-09-17 DIAGNOSIS — I4891 Unspecified atrial fibrillation: Secondary | ICD-10-CM | POA: Diagnosis not present

## 2018-09-17 DIAGNOSIS — Z5181 Encounter for therapeutic drug level monitoring: Secondary | ICD-10-CM | POA: Diagnosis not present

## 2018-09-17 LAB — POCT INR: INR: 2.9 (ref 2.0–3.0)

## 2018-09-17 NOTE — Patient Instructions (Signed)
Continue 1 tablet daily except 1 1/2 tablets on Tuesdays, Thursdays and Saturdays Recheck in 6 weeks

## 2018-10-29 ENCOUNTER — Telehealth: Payer: Self-pay | Admitting: *Deleted

## 2018-10-29 NOTE — Telephone Encounter (Signed)

## 2018-10-30 ENCOUNTER — Ambulatory Visit (INDEPENDENT_AMBULATORY_CARE_PROVIDER_SITE_OTHER): Payer: Medicare Other | Admitting: *Deleted

## 2018-10-30 DIAGNOSIS — K5100 Ulcerative (chronic) pancolitis without complications: Secondary | ICD-10-CM | POA: Diagnosis not present

## 2018-10-30 DIAGNOSIS — I4891 Unspecified atrial fibrillation: Secondary | ICD-10-CM | POA: Diagnosis not present

## 2018-10-30 DIAGNOSIS — C3411 Malignant neoplasm of upper lobe, right bronchus or lung: Secondary | ICD-10-CM | POA: Diagnosis not present

## 2018-10-30 DIAGNOSIS — Z5181 Encounter for therapeutic drug level monitoring: Secondary | ICD-10-CM | POA: Diagnosis not present

## 2018-10-30 DIAGNOSIS — I5032 Chronic diastolic (congestive) heart failure: Secondary | ICD-10-CM | POA: Diagnosis not present

## 2018-10-30 DIAGNOSIS — Z681 Body mass index (BMI) 19 or less, adult: Secondary | ICD-10-CM | POA: Diagnosis not present

## 2018-10-30 DIAGNOSIS — H35342 Macular cyst, hole, or pseudohole, left eye: Secondary | ICD-10-CM | POA: Diagnosis not present

## 2018-10-30 DIAGNOSIS — M810 Age-related osteoporosis without current pathological fracture: Secondary | ICD-10-CM | POA: Diagnosis not present

## 2018-10-30 LAB — POCT INR: INR: 3.3 — AB (ref 2.0–3.0)

## 2018-10-30 NOTE — Patient Instructions (Signed)
Hold coumadin tonight then resume 1 tablet daily except 1 1/2 tablets on Tuesdays, Thursdays and Saturdays Recheck in 6 weeks

## 2018-10-31 ENCOUNTER — Other Ambulatory Visit: Payer: Self-pay | Admitting: Cardiology

## 2018-11-06 ENCOUNTER — Other Ambulatory Visit: Payer: Self-pay

## 2018-11-06 ENCOUNTER — Encounter: Payer: Self-pay | Admitting: Internal Medicine

## 2018-11-06 ENCOUNTER — Inpatient Hospital Stay: Payer: Medicare Other | Attending: Internal Medicine

## 2018-11-06 ENCOUNTER — Inpatient Hospital Stay (HOSPITAL_BASED_OUTPATIENT_CLINIC_OR_DEPARTMENT_OTHER): Payer: Medicare Other | Admitting: Internal Medicine

## 2018-11-06 ENCOUNTER — Other Ambulatory Visit: Payer: Self-pay | Admitting: Medical Oncology

## 2018-11-06 VITALS — BP 139/88 | HR 87 | Temp 97.8°F | Resp 18 | Ht 63.0 in | Wt 103.0 lb

## 2018-11-06 DIAGNOSIS — C3411 Malignant neoplasm of upper lobe, right bronchus or lung: Secondary | ICD-10-CM

## 2018-11-06 DIAGNOSIS — E785 Hyperlipidemia, unspecified: Secondary | ICD-10-CM

## 2018-11-06 DIAGNOSIS — Z7901 Long term (current) use of anticoagulants: Secondary | ICD-10-CM | POA: Diagnosis not present

## 2018-11-06 DIAGNOSIS — I4891 Unspecified atrial fibrillation: Secondary | ICD-10-CM

## 2018-11-06 DIAGNOSIS — E039 Hypothyroidism, unspecified: Secondary | ICD-10-CM | POA: Insufficient documentation

## 2018-11-06 DIAGNOSIS — Z79899 Other long term (current) drug therapy: Secondary | ICD-10-CM

## 2018-11-06 DIAGNOSIS — C3490 Malignant neoplasm of unspecified part of unspecified bronchus or lung: Secondary | ICD-10-CM

## 2018-11-06 DIAGNOSIS — Z5181 Encounter for therapeutic drug level monitoring: Secondary | ICD-10-CM

## 2018-11-06 LAB — CMP (CANCER CENTER ONLY)
ALT: 17 U/L (ref 0–44)
AST: 28 U/L (ref 15–41)
Albumin: 3.9 g/dL (ref 3.5–5.0)
Alkaline Phosphatase: 93 U/L (ref 38–126)
Anion gap: 9 (ref 5–15)
BUN: 15 mg/dL (ref 8–23)
CO2: 28 mmol/L (ref 22–32)
Calcium: 9 mg/dL (ref 8.9–10.3)
Chloride: 102 mmol/L (ref 98–111)
Creatinine: 0.97 mg/dL (ref 0.44–1.00)
GFR, Est AFR Am: >60 mL/min (ref >60)
GFR, Estimated: 53 mL/min — ABNORMAL LOW (ref >60)
Glucose, Bld: 97 mg/dL (ref 70–99)
Potassium: 4.5 mmol/L (ref 3.5–5.1)
Sodium: 139 mmol/L (ref 135–145)
Total Bilirubin: 0.5 mg/dL (ref 0.3–1.2)
Total Protein: 7.4 g/dL (ref 6.5–8.1)

## 2018-11-06 LAB — CBC WITH DIFFERENTIAL (CANCER CENTER ONLY)
Abs Immature Granulocytes: 0.01 10*3/uL (ref 0.00–0.07)
Basophils Absolute: 0 10*3/uL (ref 0.0–0.1)
Basophils Relative: 1 %
Eosinophils Absolute: 0.3 10*3/uL (ref 0.0–0.5)
Eosinophils Relative: 7 %
HCT: 45.5 % (ref 36.0–46.0)
Hemoglobin: 14.3 g/dL (ref 12.0–15.0)
Immature Granulocytes: 0 %
Lymphocytes Relative: 28 %
Lymphs Abs: 1.3 10*3/uL (ref 0.7–4.0)
MCH: 29.2 pg (ref 26.0–34.0)
MCHC: 31.4 g/dL (ref 30.0–36.0)
MCV: 93 fL (ref 80.0–100.0)
Monocytes Absolute: 0.7 10*3/uL (ref 0.1–1.0)
Monocytes Relative: 17 %
Neutro Abs: 2.1 10*3/uL (ref 1.7–7.7)
Neutrophils Relative %: 47 %
Platelet Count: 250 10*3/uL (ref 150–400)
RBC: 4.89 MIL/uL (ref 3.87–5.11)
RDW: 14.2 % (ref 11.5–15.5)
WBC Count: 4.5 10*3/uL (ref 4.0–10.5)
nRBC: 0 % (ref 0.0–0.2)

## 2018-11-06 MED ORDER — IRESSA 250 MG PO TABS: 250.0000 mg | ORAL_TABLET | Freq: Every day | ORAL | 5 refills | Status: DC

## 2018-11-06 NOTE — Progress Notes (Signed)
[place] [phone]   Fax:(336) [phone]  OFFICE PROGRESS NOTE  [name], MD [place] [address]  DIAGNOSIS: Recurrent non-small cell lung cancer presented with bilateral lung nodules in October 2016 initially diagnosed as a stage IA adenocarcinoma with positive EGFR mutation with deletion in exon 18 in December 2012.  PRIOR THERAPY: Status post bronchoscopy with right VATS and right upper lobectomy with lymph node dissection under the care of Dr. Servando Snare on 05/31/2011.  CURRENT THERAPY: Iressa 250 mg by mouth daily started 04/17/2015, status post 42 months of treatment.  INTERVAL HISTORY: Natalie Carlson 85 y.o. female returns to the clinic today for follow-up visit.  The patient is feeling fine today with no concerning complaints.  She has been tolerating her treatment with Aricept fairly well.  She denied having any chest pain, shortness of breath, cough or hemoptysis.  She denied having any fever or chills.  She has no nausea, vomiting, diarrhea or constipation.  She has no weight loss or night sweats.  The patient is here today for evaluation and repeat blood work.  MEDICAL HISTORY: Past Medical History:  Diagnosis Date  . Arthritis   . Atrial fibrillation ([place])   . Cataract of both eyes   . Chronic back pain   . Chronic diarrhea   . Colitis   . Hemorrhoids   . History of colon polyps   . History of migraines   . Hyperlipidemia   . Hypothyroidism   . Macular degeneration, dry   . Malignant neoplasm of right upper lobe of lung (HCC)    Stage 1A  EGFR positive  Deletion in exon 19.  ALK-negative  Right Upper Lobe Lung Mass  SUV 2.9 on PET Status post VATS and right upper lobe lobectomy 05/31/2011  . Mucus in stool   . Nephrolithiasis    1994 - passed on its on    ALLERGIES:  is allergic to doxycycline; entocort ec [budesonide]; neurontin [gabapentin]; vicodin [hydrocodone-acetaminophen]; adhesive [tape]; amoxicillin;  chocolate; codeine; levaquin [levofloxacin in d5w]; morphine and related; naprosyn [naproxen]; penicillins; prednisone; tramadol; and uncoded nonscreenable allergen.  MEDICATIONS:  Current Outpatient Medications  Medication Sig Dispense Refill  . Calcium Carbonate (CALCIUM 600 PO) Take 1 tablet by mouth 2 (two) times daily.    Marland Kitchen CARTIA XT 180 MG 24 hr capsule TAKE ONE CAPSULE BY MOUTH EVERY DAY 90 capsule 1  . diclofenac sodium (VOLTAREN) 1 % GEL Apply 1 application topically 2 (two) times daily as needed (for neck pain).     . furosemide (LASIX) 20 MG tablet TAKE 1 TABLET BY MOUTH EVERY DAY AS NEEDED FOR FLUID 90 tablet 1  . gefitinib (IRESSA) 250 MG tablet Take 1 tablet (250 mg total) by mouth daily. 30 tablet 5  . HYDROmorphone (DILAUDID) 4 MG tablet Take 1 tablet (4 mg total) by mouth every 6 (six) hours as needed for severe pain. (Patient not taking: Reported on 06/17/2018) 20 tablet 0  . loperamide (IMODIUM A-D) 2 MG tablet Take 1 mg by mouth daily as needed for diarrhea or loose stools.    . LUTEIN PO Take 1 capsule by mouth daily. Reported on 10/27/2015    . magnesium 30 MG tablet Take 30 mg by mouth at bedtime.     . mesalamine (APRISO) 0.375 g 24 hr capsule TAKE 2 CAPSULE BY MOUTH TWICE DAILY 120 capsule 11  . Multiple Vitamins-Minerals (AIRBORNE) CHEW Chew 1 tablet by mouth daily.    Marland Kitchen  Multiple Vitamins-Minerals (HAIR/SKIN/NAILS) TABS Take 1 tablet by mouth daily.    . Multiple Vitamins-Minerals (PRESERVISION AREDS 2 PO) Take 1 tablet by mouth 2 (two) times daily with a meal.    . NON FORMULARY Eye In jection every 8-10weeks.    . NON FORMULARY Place 1 drop into both eyes. Thera Tears    . Omega-3 Fatty Acids (FISH OIL) 1200 MG CAPS Take 1,200 mg by mouth daily.    . ondansetron (ZOFRAN ODT) 4 MG disintegrating tablet Take 1 tablet (4 mg total) by mouth every 8 (eight) hours as needed. (Patient not taking: Reported on 06/17/2018) 10 tablet 1  . potassium chloride (K-DUR,KLOR-CON) 10 MEQ  tablet TAKE 1 TABLET BY MOUTH EVERY DAY 90 tablet 1  . warfarin (COUMADIN) 5 MG tablet Take 1 tablet daily except 1 1/2 tablets on Tuesdays, Thursdays and Saturdays or as directed 40 tablet 3   No current facility-administered medications for this visit.     SURGICAL HISTORY:  Past Surgical History:  Procedure Laterality Date  . Bladder tack  1996  . CARDIOVERSION  11/02/09   x 2  . CATARACT EXTRACTION  2010/2011  . COLONOSCOPY  3 YRS AGO  . COLONOSCOPY  03/06/2011   Procedure: COLONOSCOPY;  Surgeon: Rogene Houston, MD;  [facility];  Service: Endoscopy;  Laterality: N/A;  7:30 am  . ESOPHAGOGASTRODUODENOSCOPY    . FLEXIBLE SIGMOIDOSCOPY N/A 10/04/2012   Procedure: FLEXIBLE SIGMOIDOSCOPY;  Surgeon: Rogene Houston, MD;  [facility];  Service: Endoscopy;  Laterality: N/A;  11:00-moved to 1015 Ann to notify pt  . THYROID SURGERY  1961  . TONSILLECTOMY    . VIDEO BRONCHOSCOPY  05/31/2011   Procedure: VIDEO BRONCHOSCOPY;  Surgeon: Grace Isaac, MD;  Location: [facility] OR;  Service: Thoracic;  Laterality: N/A;    REVIEW OF SYSTEMS:  A comprehensive review of systems was negative.   PHYSICAL EXAMINATION: General appearance: alert, cooperative and no distress Head: Normocephalic, without obvious abnormality, atraumatic Neck: no adenopathy, no JVD, supple, symmetrical, trachea midline and thyroid not enlarged, symmetric, no tenderness/mass/nodules Lymph nodes: Cervical, supraclavicular, and axillary nodes normal. Resp: clear to auscultation bilaterally Back: symmetric, no curvature. ROM normal. No CVA tenderness. Cardio: regular rate and rhythm, S1, S2 normal, no murmur, click, rub or gallop GI: soft, non-tender; bowel sounds normal; no masses,  no organomegaly Extremities: extremities normal, atraumatic, no cyanosis or edema  ECOG PERFORMANCE STATUS: 0 - Asymptomatic  Blood pressure 139/88, pulse 87, temperature 97.8 F (36.6 C), temperature source Oral, resp.  rate 18, height _0  (1.6 m), weight 103 lb (46.7 kg), SpO2 95 %.  LABORATORY DATA: Lab Results  Component Value Date   WBC 4.5 11/06/2018   HGB 14.3 11/06/2018   HCT 45.5 11/06/2018   MCV 93.0 11/06/2018   PLT 250 11/06/2018      Chemistry      Component Value Date/Time   NA 139 09/09/2018 1055   NA 141 05/14/2017 1304   K 4.2 09/09/2018 1055   K 4.3 05/14/2017 1304   CL 103 09/09/2018 1055   CO2 27 09/09/2018 1055   CO2 30 (H) 05/14/2017 1304   BUN 13 09/09/2018 1055   BUN 19.0 05/14/2017 1304   CREATININE 0.77 09/09/2018 1055   CREATININE 0.8 05/14/2017 1304      Component Value Date/Time   CALCIUM 9.0 09/09/2018 1055   CALCIUM 9.1 05/14/2017 1304   ALKPHOS 101 09/09/2018 1055   ALKPHOS 86 05/14/2017 1304  AST 23 09/09/2018 1055   AST 25 05/14/2017 1304   ALT 13 09/09/2018 1055   ALT 15 05/14/2017 1304   BILITOT 0.5 09/09/2018 1055   BILITOT 0.57 05/14/2017 1304       RADIOGRAPHIC STUDIES: No results found.  ASSESSMENT AND PLAN:  This is a very pleasant 85 years old white female with recurrent non-small cell lung cancer, adenocarcinoma with positive EGFR mutation. The patient has been on treatment with Iressa 250 mg p.o. daily for the last 42 months. She has been tolerating this treatment well with no concerning adverse effects. I recommended for her to continue her current treatment with synovitis of the symptoms. I will see her back for follow-up visit in 3 months for evaluation and repeat blood work. The patient was advised to quit immediately if she has any concerning symptoms in the interval. The patient voices understanding of current disease status and treatment options and is in agreement with the current care plan. All questions were answered. The patient knows to call the clinic with any problems, questions or concerns. We can certainly see the patient much sooner if necessary. I spent 15 minutes counseling the patient face to face. The total time  spent in the appointment was 25 minutes.   Disclaimer: This note was dictated with voice recognition software. Similar sounding words can inadvertently be transcribed and may not be corrected upon review.

## 2018-11-07 ENCOUNTER — Telehealth: Payer: Self-pay | Admitting: Internal Medicine

## 2018-11-07 NOTE — Telephone Encounter (Signed)
Scheduled appt per 6/24 los - no answer - mailed letter with appt date and time

## 2018-11-13 ENCOUNTER — Telehealth: Payer: Self-pay | Admitting: Internal Medicine

## 2018-11-13 NOTE — Telephone Encounter (Signed)
Returned patient's phone call regarding rescheduling 08/25 appointment, the appointment has now been moved to 08/27.

## 2018-11-25 DIAGNOSIS — H353231 Exudative age-related macular degeneration, bilateral, with active choroidal neovascularization: Secondary | ICD-10-CM | POA: Diagnosis not present

## 2018-11-25 DIAGNOSIS — H43813 Vitreous degeneration, bilateral: Secondary | ICD-10-CM | POA: Diagnosis not present

## 2018-11-25 DIAGNOSIS — H35371 Puckering of macula, right eye: Secondary | ICD-10-CM | POA: Diagnosis not present

## 2018-11-25 DIAGNOSIS — H43391 Other vitreous opacities, right eye: Secondary | ICD-10-CM | POA: Diagnosis not present

## 2018-12-10 ENCOUNTER — Telehealth: Payer: Self-pay | Admitting: Cardiovascular Disease

## 2018-12-10 NOTE — Telephone Encounter (Signed)

## 2018-12-11 ENCOUNTER — Other Ambulatory Visit: Payer: Self-pay

## 2018-12-11 ENCOUNTER — Ambulatory Visit (INDEPENDENT_AMBULATORY_CARE_PROVIDER_SITE_OTHER): Payer: Medicare Other | Admitting: *Deleted

## 2018-12-11 DIAGNOSIS — I4891 Unspecified atrial fibrillation: Secondary | ICD-10-CM

## 2018-12-11 DIAGNOSIS — Z5181 Encounter for therapeutic drug level monitoring: Secondary | ICD-10-CM | POA: Diagnosis not present

## 2018-12-11 LAB — POCT INR: INR: 3 (ref 2.0–3.0)

## 2018-12-11 NOTE — Patient Instructions (Signed)
Continue coumadin 1 tablet daily except 1 1/2 tablets on Tuesdays, Thursdays and Saturdays Recheck in 6 weeks

## 2018-12-18 ENCOUNTER — Other Ambulatory Visit: Payer: Self-pay | Admitting: *Deleted

## 2018-12-18 DIAGNOSIS — C3490 Malignant neoplasm of unspecified part of unspecified bronchus or lung: Secondary | ICD-10-CM

## 2018-12-18 MED ORDER — IRESSA 250 MG PO TABS: 250.0000 mg | ORAL_TABLET | Freq: Every day | ORAL | 5 refills | Status: DC

## 2018-12-30 DIAGNOSIS — Z681 Body mass index (BMI) 19 or less, adult: Secondary | ICD-10-CM | POA: Diagnosis not present

## 2018-12-30 DIAGNOSIS — J069 Acute upper respiratory infection, unspecified: Secondary | ICD-10-CM | POA: Diagnosis not present

## 2018-12-30 DIAGNOSIS — R05 Cough: Secondary | ICD-10-CM | POA: Diagnosis not present

## 2018-12-31 ENCOUNTER — Other Ambulatory Visit: Payer: Self-pay | Admitting: Cardiology

## 2019-01-07 ENCOUNTER — Ambulatory Visit: Payer: Medicare Other | Admitting: Internal Medicine

## 2019-01-07 ENCOUNTER — Other Ambulatory Visit: Payer: Medicare Other

## 2019-01-09 ENCOUNTER — Encounter: Payer: Self-pay | Admitting: Internal Medicine

## 2019-01-09 ENCOUNTER — Inpatient Hospital Stay (HOSPITAL_BASED_OUTPATIENT_CLINIC_OR_DEPARTMENT_OTHER): Payer: Medicare Other | Admitting: Internal Medicine

## 2019-01-09 ENCOUNTER — Other Ambulatory Visit: Payer: Self-pay

## 2019-01-09 ENCOUNTER — Inpatient Hospital Stay: Payer: Medicare Other | Attending: Internal Medicine

## 2019-01-09 VITALS — BP 135/76 | HR 73 | Temp 98.9°F | Resp 18 | Ht 63.0 in | Wt 105.7 lb

## 2019-01-09 DIAGNOSIS — Z888 Allergy status to other drugs, medicaments and biological substances status: Secondary | ICD-10-CM | POA: Insufficient documentation

## 2019-01-09 DIAGNOSIS — Z881 Allergy status to other antibiotic agents status: Secondary | ICD-10-CM | POA: Diagnosis not present

## 2019-01-09 DIAGNOSIS — R52 Pain, unspecified: Secondary | ICD-10-CM | POA: Insufficient documentation

## 2019-01-09 DIAGNOSIS — I4891 Unspecified atrial fibrillation: Secondary | ICD-10-CM | POA: Diagnosis not present

## 2019-01-09 DIAGNOSIS — C3411 Malignant neoplasm of upper lobe, right bronchus or lung: Secondary | ICD-10-CM

## 2019-01-09 DIAGNOSIS — Z885 Allergy status to narcotic agent status: Secondary | ICD-10-CM | POA: Diagnosis not present

## 2019-01-09 DIAGNOSIS — Z8719 Personal history of other diseases of the digestive system: Secondary | ICD-10-CM | POA: Insufficient documentation

## 2019-01-09 DIAGNOSIS — Z7901 Long term (current) use of anticoagulants: Secondary | ICD-10-CM | POA: Insufficient documentation

## 2019-01-09 DIAGNOSIS — Z880 Allergy status to penicillin: Secondary | ICD-10-CM | POA: Diagnosis not present

## 2019-01-09 DIAGNOSIS — C3490 Malignant neoplasm of unspecified part of unspecified bronchus or lung: Secondary | ICD-10-CM | POA: Diagnosis not present

## 2019-01-09 DIAGNOSIS — Z79899 Other long term (current) drug therapy: Secondary | ICD-10-CM | POA: Insufficient documentation

## 2019-01-09 DIAGNOSIS — Z5181 Encounter for therapeutic drug level monitoring: Secondary | ICD-10-CM | POA: Diagnosis not present

## 2019-01-09 DIAGNOSIS — Z886 Allergy status to analgesic agent status: Secondary | ICD-10-CM | POA: Insufficient documentation

## 2019-01-09 DIAGNOSIS — Z85118 Personal history of other malignant neoplasm of bronchus and lung: Secondary | ICD-10-CM | POA: Insufficient documentation

## 2019-01-09 DIAGNOSIS — M1990 Unspecified osteoarthritis, unspecified site: Secondary | ICD-10-CM | POA: Diagnosis not present

## 2019-01-09 LAB — CBC WITH DIFFERENTIAL (CANCER CENTER ONLY)
Abs Immature Granulocytes: 0 10*3/uL (ref 0.00–0.07)
Basophils Absolute: 0 10*3/uL (ref 0.0–0.1)
Basophils Relative: 1 %
Eosinophils Absolute: 0.2 10*3/uL (ref 0.0–0.5)
Eosinophils Relative: 4 %
HCT: 41.3 % (ref 36.0–46.0)
Hemoglobin: 13.6 g/dL (ref 12.0–15.0)
Immature Granulocytes: 0 %
Lymphocytes Relative: 25 %
Lymphs Abs: 1.2 10*3/uL (ref 0.7–4.0)
MCH: 30.4 pg (ref 26.0–34.0)
MCHC: 32.9 g/dL (ref 30.0–36.0)
MCV: 92.4 fL (ref 80.0–100.0)
Monocytes Absolute: 0.8 10*3/uL (ref 0.1–1.0)
Monocytes Relative: 15 %
Neutro Abs: 2.7 10*3/uL (ref 1.7–7.7)
Neutrophils Relative %: 55 %
Platelet Count: 285 10*3/uL (ref 150–400)
RBC: 4.47 MIL/uL (ref 3.87–5.11)
RDW: 13.9 % (ref 11.5–15.5)
WBC Count: 5 10*3/uL (ref 4.0–10.5)
nRBC: 0 % (ref 0.0–0.2)

## 2019-01-09 LAB — CMP (CANCER CENTER ONLY)
ALT: 14 U/L (ref 0–44)
AST: 27 U/L (ref 15–41)
Albumin: 3.6 g/dL (ref 3.5–5.0)
Alkaline Phosphatase: 85 U/L (ref 38–126)
Anion gap: 8 (ref 5–15)
BUN: 14 mg/dL (ref 8–23)
CO2: 26 mmol/L (ref 22–32)
Calcium: 8.9 mg/dL (ref 8.9–10.3)
Chloride: 104 mmol/L (ref 98–111)
Creatinine: 0.78 mg/dL (ref 0.44–1.00)
GFR, Est AFR Am: >60 mL/min (ref >60)
GFR, Estimated: >60 mL/min (ref >60)
Glucose, Bld: 91 mg/dL (ref 70–99)
Potassium: 4.5 mmol/L (ref 3.5–5.1)
Sodium: 138 mmol/L (ref 135–145)
Total Bilirubin: 0.4 mg/dL (ref 0.3–1.2)
Total Protein: 7.1 g/dL (ref 6.5–8.1)

## 2019-01-09 NOTE — Progress Notes (Signed)
[facility] [phone]   [phone]  OFFICE PROGRESS NOTE  [name], MD [place] [address]  DIAGNOSIS: Recurrent non-small cell lung cancer presented with bilateral lung nodules in October 2016 initially diagnosed as a stage IA adenocarcinoma with positive EGFR mutation with deletion in exon 66 in December 2012.  PRIOR THERAPY: Status post bronchoscopy with right VATS and right upper lobectomy with lymph node dissection under the care of Dr. Servando Snare on 05/31/2011.  CURRENT THERAPY: Iressa 250 mg by mouth daily started 04/17/2015, status post 44 months of treatment.  INTERVAL HISTORY: Natalie Carlson 85 y.o. female returns to the clinic today for follow-up visit.  The patient is feeling fine today with no concerning complaints except for mild aching pain after working in her yard recently.  She denied having any current chest pain, shortness of breath, cough or hemoptysis.  She denied having any fever or chills.  She has no nausea, vomiting, diarrhea or constipation.  She denied having any headache or visual changes.  She continues to tolerate her treatment with Iressa fairly well.  The patient is here today for evaluation and repeat blood work.  MEDICAL HISTORY: Past Medical History:  Diagnosis Date  . Arthritis   . Atrial fibrillation ([place])   . Cataract of both eyes   . Chronic back pain   . Chronic diarrhea   . Colitis   . Hemorrhoids   . History of colon polyps   . History of migraines   . Hyperlipidemia   . Hypothyroidism   . Macular degeneration, dry   . Malignant neoplasm of right upper lobe of lung (HCC)    Stage 1A  EGFR positive  Deletion in exon 19.  ALK-negative  Right Upper Lobe Lung Mass  SUV 2.9 on PET Status post VATS and right upper lobe lobectomy 05/31/2011  . Mucus in stool   . Nephrolithiasis    1994 - passed on its on    ALLERGIES:  is allergic to doxycycline; entocort ec [budesonide]; neurontin  [gabapentin]; vicodin [hydrocodone-acetaminophen]; adhesive [tape]; amoxicillin; chocolate; codeine; levaquin [levofloxacin in d5w]; morphine and related; naprosyn [naproxen]; penicillins; prednisone; tramadol; and uncoded nonscreenable allergen.  MEDICATIONS:  Current Outpatient Medications  Medication Sig Dispense Refill  . Calcium Carbonate (CALCIUM 600 PO) Take 1 tablet by mouth 2 (two) times daily.    Marland Kitchen CARTIA XT 180 MG 24 hr capsule TAKE ONE CAPSULE BY MOUTH EVERY DAY 90 capsule 1  . diclofenac sodium (VOLTAREN) 1 % GEL Apply 1 application topically 2 (two) times daily as needed (for neck pain).     . furosemide (LASIX) 20 MG tablet TAKE 1 TABLET BY MOUTH EVERY DAY AS NEEDED FOR FLUID 90 tablet 1  . gefitinib (IRESSA) 250 MG tablet Take 1 tablet (250 mg total) by mouth daily. 30 tablet 5  . HYDROmorphone (DILAUDID) 4 MG tablet Take 1 tablet (4 mg total) by mouth every 6 (six) hours as needed for severe pain. (Patient not taking: Reported on 06/17/2018) 20 tablet 0  . loperamide (IMODIUM A-D) 2 MG tablet Take 1 mg by mouth daily as needed for diarrhea or loose stools.    . LUTEIN PO Take 1 capsule by mouth daily. Reported on 10/27/2015    . magnesium 30 MG tablet Take 30 mg by mouth at bedtime.     . mesalamine (APRISO) 0.375 g 24 hr capsule TAKE 2 CAPSULE BY MOUTH TWICE DAILY 120 capsule 11  .  Multiple Vitamins-Minerals (AIRBORNE) CHEW Chew 1 tablet by mouth daily.    . Multiple Vitamins-Minerals (HAIR/SKIN/NAILS) TABS Take 1 tablet by mouth daily.    . Multiple Vitamins-Minerals (PRESERVISION AREDS 2 PO) Take 1 tablet by mouth 2 (two) times daily with a meal.    . NON FORMULARY Eye In jection every 8-10weeks.    . NON FORMULARY Place 1 drop into both eyes. Thera Tears    . Omega-3 Fatty Acids (FISH OIL) 1200 MG CAPS Take 1,200 mg by mouth daily.    . ondansetron (ZOFRAN ODT) 4 MG disintegrating tablet Take 1 tablet (4 mg total) by mouth every 8 (eight) hours as needed. (Patient not taking:  Reported on 06/17/2018) 10 tablet 1  . potassium chloride (K-DUR,KLOR-CON) 10 MEQ tablet TAKE 1 TABLET BY MOUTH EVERY DAY 90 tablet 1  . warfarin (COUMADIN) 5 MG tablet TAKE 1 TABLET BY MOUTH DAILY EXCEPT TAKE ONE AND ONE-HALF TABLETS ON TUESDAYS, THURSDAYS, AND SATURDAYS. 40 tablet 3   No current facility-administered medications for this visit.     SURGICAL HISTORY:  Past Surgical History:  Procedure Laterality Date  . Bladder tack  1996  . CARDIOVERSION  11/02/09   x 2  . CATARACT EXTRACTION  2010/2011  . COLONOSCOPY  3 YRS AGO  . COLONOSCOPY  03/06/2011   Procedure: COLONOSCOPY;  Surgeon: Rogene Houston, MD;  Location: [facility];  Service: Endoscopy;  Laterality: N/A;  7:30 am  . ESOPHAGOGASTRODUODENOSCOPY    . FLEXIBLE SIGMOIDOSCOPY N/A 10/04/2012   Procedure: FLEXIBLE SIGMOIDOSCOPY;  Surgeon: Rogene Houston, MD;  Location: [facility];  Service: Endoscopy;  Laterality: N/A;  11:00-moved to 1015 Ann to notify pt  . THYROID SURGERY  1961  . TONSILLECTOMY    . VIDEO BRONCHOSCOPY  05/31/2011   Procedure: VIDEO BRONCHOSCOPY;  Surgeon: Grace Isaac, MD;  Location: [facility] OR;  Service: Thoracic;  Laterality: N/A;    REVIEW OF SYSTEMS:  A comprehensive review of systems was negative.   PHYSICAL EXAMINATION: General appearance: alert, cooperative and no distress Head: Normocephalic, without obvious abnormality, atraumatic Neck: no adenopathy, no JVD, supple, symmetrical, trachea midline and thyroid not enlarged, symmetric, no tenderness/mass/nodules Lymph nodes: Cervical, supraclavicular, and axillary nodes normal. Resp: clear to auscultation bilaterally Back: symmetric, no curvature. ROM normal. No CVA tenderness. Cardio: regular rate and rhythm, S1, S2 normal, no murmur, click, rub or gallop GI: soft, non-tender; bowel sounds normal; no masses,  no organomegaly Extremities: extremities normal, atraumatic, no cyanosis or edema  ECOG PERFORMANCE STATUS: 0 - Asymptomatic   Blood pressure 135/76, pulse 73, temperature 98.9 F (37.2 C), temperature source Oral, resp. rate 18, height 5' 3" (1.6 m), weight 105 lb 11.2 oz (47.9 kg), SpO2 96 %.  LABORATORY DATA: Lab Results  Component Value Date   WBC 5.0 01/09/2019   HGB 13.6 01/09/2019   HCT 41.3 01/09/2019   MCV 92.4 01/09/2019   PLT 285 01/09/2019      Chemistry      Component Value Date/Time   NA 139 11/06/2018 1113   NA 141 05/14/2017 1304   K 4.5 11/06/2018 1113   K 4.3 05/14/2017 1304   CL 102 11/06/2018 1113   CO2 28 11/06/2018 1113   CO2 30 (H) 05/14/2017 1304   BUN 15 11/06/2018 1113   BUN 19.0 05/14/2017 1304   CREATININE 0.97 11/06/2018 1113   CREATININE 0.8 05/14/2017 1304      Component Value Date/Time   CALCIUM 9.0 11/06/2018 1113  CALCIUM 9.1 05/14/2017 1304   ALKPHOS 93 11/06/2018 1113   ALKPHOS 86 05/14/2017 1304   AST 28 11/06/2018 1113   AST 25 05/14/2017 1304   ALT 17 11/06/2018 1113   ALT 15 05/14/2017 1304   BILITOT 0.5 11/06/2018 1113   BILITOT 0.57 05/14/2017 1304       RADIOGRAPHIC STUDIES: No results found.  ASSESSMENT AND PLAN:  This is a very pleasant 85 years old white female with recurrent non-small cell lung cancer, adenocarcinoma with positive EGFR mutation. The patient has been on treatment with Iressa 250 mg p.o. daily for the last 44 months. The patient has been tolerating this treatment well with no concerning adverse effects. I recommended for her to continue her current treatment with Iressa with the same dose. I will see her back for follow-up visit in 2 months for evaluation with repeat blood work and CT scan of the chest. She was advised to call immediately if she has any concerning symptoms in the interval. The patient voices understanding of current disease status and treatment options and is in agreement with the current care plan. All questions were answered. The patient knows to call the clinic with any problems, questions or concerns. We  can certainly see the patient much sooner if necessary. I spent 10 minutes counseling the patient face to face. The total time spent in the appointment was 15 minutes.   Disclaimer: This note was dictated with voice recognition software. Similar sounding words can inadvertently be transcribed and may not be corrected upon review.

## 2019-01-10 ENCOUNTER — Telehealth: Payer: Self-pay | Admitting: Internal Medicine

## 2019-01-10 NOTE — Telephone Encounter (Signed)
Scheduled appt per 8/27 los - unable to reach pt . Left message with appt date and time

## 2019-01-14 ENCOUNTER — Telehealth: Payer: Self-pay | Admitting: Pharmacist

## 2019-01-14 NOTE — Telephone Encounter (Signed)
Called pt to discuss changing to [place] therapy due to better safety and efficacy data, especially in the setting of COVID pandemic to help reduce in-office visits. Pt prefers to stay on warfarin at this time.

## 2019-01-16 ENCOUNTER — Telehealth: Payer: Self-pay | Admitting: Internal Medicine

## 2019-01-16 NOTE — Telephone Encounter (Signed)
Returned patient's phone call regarding cancelling an appointment, patient will be getting both CT scan and Labs done at [facility]. Labs have been cancelled for 10/22.

## 2019-01-22 ENCOUNTER — Ambulatory Visit (INDEPENDENT_AMBULATORY_CARE_PROVIDER_SITE_OTHER): Payer: Medicare Other | Admitting: *Deleted

## 2019-01-22 ENCOUNTER — Other Ambulatory Visit: Payer: Self-pay

## 2019-01-22 DIAGNOSIS — Z5181 Encounter for therapeutic drug level monitoring: Secondary | ICD-10-CM | POA: Diagnosis not present

## 2019-01-22 DIAGNOSIS — I4891 Unspecified atrial fibrillation: Secondary | ICD-10-CM

## 2019-01-22 LAB — POCT INR: INR: 3.8 — AB (ref 2.0–3.0)

## 2019-01-22 NOTE — Patient Instructions (Signed)
Hold coumadin tonight then decrease dose to 1 tablet daily except 1 1/2 tablets on Tuesdays Recheck in 4 weeks

## 2019-02-12 ENCOUNTER — Other Ambulatory Visit (HOSPITAL_COMMUNITY)
Admission: RE | Admit: 2019-02-12 | Discharge: 2019-02-12 | Disposition: A | Discharge status: Home / Self Care | Payer: Medicare Other | Source: Ambulatory Visit | Attending: Internal Medicine | Admitting: Internal Medicine

## 2019-02-12 ENCOUNTER — Other Ambulatory Visit: Payer: Self-pay

## 2019-02-12 DIAGNOSIS — C3490 Malignant neoplasm of unspecified part of unspecified bronchus or lung: Secondary | ICD-10-CM | POA: Insufficient documentation

## 2019-02-12 LAB — COMPREHENSIVE METABOLIC PANEL
ALT: 17 U/L (ref 0–44)
AST: 30 U/L (ref 15–41)
Albumin: 3.7 g/dL (ref 3.5–5.0)
Alkaline Phosphatase: 81 U/L (ref 38–126)
Anion gap: 8 (ref 5–15)
BUN: 17 mg/dL (ref 8–23)
CO2: 27 mmol/L (ref 22–32)
Calcium: 8.9 mg/dL (ref 8.9–10.3)
Chloride: 102 mmol/L (ref 98–111)
Creatinine, Ser: 0.71 mg/dL (ref 0.44–1.00)
GFR calc Af Amer: >60 mL/min (ref >60)
GFR calc non Af Amer: >60 mL/min (ref >60)
Glucose, Bld: 103 mg/dL — ABNORMAL HIGH (ref 70–99)
Potassium: 4.8 mmol/L (ref 3.5–5.1)
Sodium: 137 mmol/L (ref 135–145)
Total Bilirubin: 0.8 mg/dL (ref 0.3–1.2)
Total Protein: 7.2 g/dL (ref 6.5–8.1)

## 2019-02-19 ENCOUNTER — Ambulatory Visit (INDEPENDENT_AMBULATORY_CARE_PROVIDER_SITE_OTHER): Payer: Medicare Other | Admitting: *Deleted

## 2019-02-19 ENCOUNTER — Other Ambulatory Visit: Payer: Self-pay

## 2019-02-19 DIAGNOSIS — Z5181 Encounter for therapeutic drug level monitoring: Secondary | ICD-10-CM

## 2019-02-19 DIAGNOSIS — I4891 Unspecified atrial fibrillation: Secondary | ICD-10-CM | POA: Diagnosis not present

## 2019-02-19 LAB — POCT INR: INR: 2.2 (ref 2.0–3.0)

## 2019-02-19 NOTE — Patient Instructions (Signed)
Continue coumadin 1 tablet daily except 1 1/2 tablets on Tuesdays Recheck in 4 weeks

## 2019-02-25 ENCOUNTER — Other Ambulatory Visit: Payer: Self-pay

## 2019-02-25 ENCOUNTER — Encounter (INDEPENDENT_AMBULATORY_CARE_PROVIDER_SITE_OTHER): Payer: Self-pay | Admitting: Internal Medicine

## 2019-02-25 ENCOUNTER — Ambulatory Visit (INDEPENDENT_AMBULATORY_CARE_PROVIDER_SITE_OTHER): Payer: Medicare Other | Admitting: Internal Medicine

## 2019-02-25 VITALS — BP 132/77 | HR 92 | Temp 97.9°F | Ht 63.0 in | Wt 107.5 lb

## 2019-02-25 DIAGNOSIS — K5010 Crohn's disease of large intestine without complications: Secondary | ICD-10-CM

## 2019-02-25 DIAGNOSIS — R143 Flatulence: Secondary | ICD-10-CM | POA: Diagnosis not present

## 2019-02-25 MED ORDER — MESALAMINE ER 0.375 G PO CP24: ORAL_CAPSULE | ORAL | 11 refills | Status: DC

## 2019-02-25 MED ORDER — SIMETHICONE 180 MG PO CAPS: 180.0000 mg | ORAL_CAPSULE | Freq: Three times a day (TID) | ORAL | 0 refills | Status: DC | PRN

## 2019-02-25 NOTE — Patient Instructions (Signed)
Take Faison on schedule II or 3 times a day for 1 to 2 weeks and if it works can use it on as-needed basis.

## 2019-02-25 NOTE — Progress Notes (Signed)
Presenting complaint;  Follow-up for Crohn's colitis.  Database and subjective:  Patient is 85 year old Caucasian female who has a history of Crohn's colitis which was diagnosed on colonoscopy in October 2012.  She has been maintained on oral mesalamine.  She is here for yearly visit.  Patient states she has doing well.  She has 1 formed stool daily.  She denies diarrhea or constipation.  She also has not passed any blood per rectum or mucus in several months.  She is not having any side effects with Apriso.  She takes split dose rather than taking all 4 capsules at the same time.  She says she has very good appetite.  She says she does community cooking and eats very well.  She also denies abdominal pain or cramping but complains of excessive flatus.  She says at times it is not controllable.  She has not tried any OTC medications.  She also reports pigmentation rash involving both legs.  She states as result lung cancer is concerned she has remained stable on treatment.  She is under care of Dr. Earlie Server of: Oncology.   Current Medications: Outpatient Encounter Medications as of 02/25/2019  Medication Sig  . Calcium Carbonate (CALCIUM 600 PO) Take 1 tablet by mouth 2 (two) times daily.  Marland Kitchen CARTIA XT 180 MG 24 hr capsule TAKE ONE CAPSULE BY MOUTH EVERY DAY  . diclofenac sodium (VOLTAREN) 1 % GEL Apply 1 application topically 2 (two) times daily as needed (for neck pain).   . furosemide (LASIX) 20 MG tablet TAKE 1 TABLET BY MOUTH EVERY DAY AS NEEDED FOR FLUID  . gefitinib (IRESSA) 250 MG tablet Take 1 tablet (250 mg total) by mouth daily.  Marland Kitchen HYDROmorphone (DILAUDID) 4 MG tablet Take 1 tablet (4 mg total) by mouth every 6 (six) hours as needed for severe pain.  Marland Kitchen loperamide (IMODIUM A-D) 2 MG tablet Take 1 mg by mouth daily as needed for diarrhea or loose stools.  . LUTEIN PO Take 1 capsule by mouth daily. Reported on 10/27/2015  . magnesium 30 MG tablet Take 30 mg by mouth at bedtime.   .  mesalamine (APRISO) 0.375 g 24 hr capsule TAKE 2 CAPSULE BY MOUTH TWICE DAILY  . Multiple Vitamins-Minerals (AIRBORNE) CHEW Chew 1 tablet by mouth daily.  . Multiple Vitamins-Minerals (HAIR/SKIN/NAILS) TABS Take 1 tablet by mouth daily.  . Multiple Vitamins-Minerals (PRESERVISION AREDS 2 PO) Take 1 tablet by mouth 2 (two) times daily with a meal.  . NON FORMULARY Eye In jection every 8-10weeks.  . Omega-3 Fatty Acids (FISH OIL) 1200 MG CAPS Take 1,200 mg by mouth daily.  . ondansetron (ZOFRAN ODT) 4 MG disintegrating tablet Take 1 tablet (4 mg total) by mouth every 8 (eight) hours as needed.  . potassium chloride (K-DUR,KLOR-CON) 10 MEQ tablet TAKE 1 TABLET BY MOUTH EVERY DAY  . warfarin (COUMADIN) 5 MG tablet TAKE 1 TABLET BY MOUTH DAILY EXCEPT TAKE ONE AND ONE-HALF TABLETS ON TUESDAYS, THURSDAYS, AND SATURDAYS.  . [DISCONTINUED] NON FORMULARY Place 1 drop into both eyes. Thera Tears   No facility-administered encounter medications on file as of 02/25/2019.    J  Objective: Blood pressure 132/77, pulse 92, temperature 97.9 F (36.6 C), temperature source Oral, height _0  (1.6 m), weight 107 lb 8 oz (48.8 kg). Patient is alert and in no acute distress. Conjunctiva is pink. Sclera is nonicteric Oropharyngeal mucosa is normal. No neck masses or thyromegaly noted. Cardiac exam with irregular rhythm normal S1 and S2. No  murmur or gallop noted. Lungs are clear to auscultation. Abdomen is symmetrical soft and nontender with organomegaly or masses. No LE edema or clubbing noted. She has atrophic to thenar muscles of both hands.  Labs/studies Results:  CBC Latest Ref Rng & Units 01/09/2019 11/06/2018 09/09/2018  WBC 4.0 - 10.5 K/uL 5.0 4.5 4.9  Hemoglobin 12.0 - 15.0 g/dL 13.6 14.3 14.0  Hematocrit 36.0 - 46.0 % 41.3 45.5 43.5  Platelets 150 - 400 K/uL 285 250 255    CMP Latest Ref Rng & Units 02/12/2019 01/09/2019 11/06/2018  Glucose 70 - 99 mg/dL 103(H) 91 97  BUN 8 - 23 mg/dL _0 Creatinine 0.44 - 1.00 mg/dL 0.71 0.78 0.97  Sodium 135 - 145 mmol/L 137 138 139  Potassium 3.5 - 5.1 mmol/L 4.8 4.5 4.5  Chloride 98 - 111 mmol/L 102 104 102  CO2 22 - 32 mmol/L _1 Calcium 8.9 - 10.3 mg/dL 8.9 8.9 9.0  Total Protein 6.5 - 8.1 g/dL 7.2 7.1 7.4  Total Bilirubin 0.3 - 1.2 mg/dL 0.8 0.4 0.5  Alkaline Phos 38 - 126 U/L 81 85 93  AST 15 - 41 U/L _2 ALT 0 - 44 U/L _3 Hepatic Function Latest Ref Rng & Units 02/12/2019 01/09/2019 11/06/2018  Total Protein 6.5 - 8.1 g/dL 7.2 7.1 7.4  Albumin 3.5 - 5.0 g/dL 3.7 3.6 3.9  AST 15 - 41 U/L _4 ALT 0 - 44 U/L _5 Alk Phosphatase 38 - 126 U/L 81 85 93  Total Bilirubin 0.3 - 1.2 mg/dL 0.8 0.4 0.5  Bilirubin, Direct 0.1 - 0.5 mg/dL - - -     Assessment:  #1.  Crohn's colitis.  She remains in clinical remission while on oral mesalamine.  Given her age colonoscopy would not be recommended unless she has relapse.  #2.  Excessive flatulence.  She may have small intestinal bacterial overgrowth.  She does not have other symptoms such as weight loss and diarrhea.  Empiric antibiotic use would not be recommended at this time as it may interfere/potentiate effect of warfarin.  However if flatulence becomes intractable would consider 10-day course of antibiotic and coordination with coagulation clinic.   Plan:  New prescription for Apriso 1500 mg p.o. daily. Phazyme 180 mg 2 or 3 times daily for 1 to 2 weeks.  If this medication helps she can use it on as-needed basis. Patient will call office with progress report in few weeks. Office visit in 1 year.

## 2019-03-06 ENCOUNTER — Other Ambulatory Visit: Payer: Medicare Other

## 2019-03-07 ENCOUNTER — Other Ambulatory Visit: Payer: Self-pay

## 2019-03-07 ENCOUNTER — Ambulatory Visit (HOSPITAL_COMMUNITY)
Admission: RE | Admit: 2019-03-07 | Discharge: 2019-03-07 | Disposition: A | Discharge status: Home / Self Care | Payer: Medicare Other | Source: Ambulatory Visit | Attending: Internal Medicine | Admitting: Internal Medicine

## 2019-03-07 DIAGNOSIS — C3490 Malignant neoplasm of unspecified part of unspecified bronchus or lung: Secondary | ICD-10-CM | POA: Insufficient documentation

## 2019-03-07 MED ORDER — IOHEXOL 300 MG/ML  SOLN
75.0000 mL | Freq: Once | INTRAMUSCULAR | Status: AC | PRN
  Administered 2019-03-07: 75 mL via INTRAVENOUS

## 2019-03-10 ENCOUNTER — Inpatient Hospital Stay: Payer: Medicare Other | Attending: Internal Medicine | Admitting: Internal Medicine

## 2019-03-10 ENCOUNTER — Other Ambulatory Visit: Payer: Self-pay

## 2019-03-10 ENCOUNTER — Telehealth: Payer: Self-pay | Admitting: Internal Medicine

## 2019-03-10 ENCOUNTER — Encounter: Payer: Self-pay | Admitting: Internal Medicine

## 2019-03-10 VITALS — BP 141/95 | HR 72 | Temp 98.2°F | Resp 18 | Ht 63.0 in | Wt 106.9 lb

## 2019-03-10 DIAGNOSIS — Z5181 Encounter for therapeutic drug level monitoring: Secondary | ICD-10-CM | POA: Diagnosis not present

## 2019-03-10 DIAGNOSIS — Z885 Allergy status to narcotic agent status: Secondary | ICD-10-CM | POA: Diagnosis not present

## 2019-03-10 DIAGNOSIS — Z888 Allergy status to other drugs, medicaments and biological substances status: Secondary | ICD-10-CM | POA: Insufficient documentation

## 2019-03-10 DIAGNOSIS — L299 Pruritus, unspecified: Secondary | ICD-10-CM | POA: Insufficient documentation

## 2019-03-10 DIAGNOSIS — Z902 Acquired absence of lung [part of]: Secondary | ICD-10-CM | POA: Diagnosis not present

## 2019-03-10 DIAGNOSIS — I700 Atherosclerosis of aorta: Secondary | ICD-10-CM | POA: Diagnosis not present

## 2019-03-10 DIAGNOSIS — Z79899 Other long term (current) drug therapy: Secondary | ICD-10-CM | POA: Diagnosis not present

## 2019-03-10 DIAGNOSIS — Z880 Allergy status to penicillin: Secondary | ICD-10-CM | POA: Diagnosis not present

## 2019-03-10 DIAGNOSIS — Z886 Allergy status to analgesic agent status: Secondary | ICD-10-CM | POA: Diagnosis not present

## 2019-03-10 DIAGNOSIS — Z881 Allergy status to other antibiotic agents status: Secondary | ICD-10-CM | POA: Diagnosis not present

## 2019-03-10 DIAGNOSIS — I2510 Atherosclerotic heart disease of native coronary artery without angina pectoris: Secondary | ICD-10-CM | POA: Diagnosis not present

## 2019-03-10 DIAGNOSIS — I4821 Permanent atrial fibrillation: Secondary | ICD-10-CM

## 2019-03-10 DIAGNOSIS — M1990 Unspecified osteoarthritis, unspecified site: Secondary | ICD-10-CM | POA: Insufficient documentation

## 2019-03-10 DIAGNOSIS — Z8719 Personal history of other diseases of the digestive system: Secondary | ICD-10-CM | POA: Diagnosis not present

## 2019-03-10 DIAGNOSIS — I4891 Unspecified atrial fibrillation: Secondary | ICD-10-CM | POA: Insufficient documentation

## 2019-03-10 DIAGNOSIS — Z7901 Long term (current) use of anticoagulants: Secondary | ICD-10-CM | POA: Diagnosis not present

## 2019-03-10 DIAGNOSIS — Z86010 Personal history of colonic polyps: Secondary | ICD-10-CM | POA: Insufficient documentation

## 2019-03-10 DIAGNOSIS — C3411 Malignant neoplasm of upper lobe, right bronchus or lung: Secondary | ICD-10-CM | POA: Insufficient documentation

## 2019-03-10 NOTE — Progress Notes (Signed)
[place] Telephone:(336) [phone]   Fax:(336) [phone]  OFFICE PROGRESS NOTE  [name], MD Natalie Carlson [id number]  DIAGNOSIS: Recurrent non-small cell lung cancer presented with bilateral lung nodules in October 2016 initially diagnosed as a stage IA adenocarcinoma with positive EGFR mutation with deletion in exon 47 in December 2012.  PRIOR THERAPY: Status post bronchoscopy with right VATS and right upper lobectomy with lymph node dissection under the care of Dr. Servando Snare on 05/31/2011.  CURRENT THERAPY: Iressa 250 mg by mouth daily started 04/17/2015, status post 46 months of treatment.  INTERVAL HISTORY: Natalie Carlson 85 y.o. female returns to the clinic today for follow-up visit.  The patient is feeling fine today with no concerning complaints.  She has no chest pain, shortness of breath, cough or hemoptysis.  She denied having any nausea, vomiting, diarrhea or constipation.  She has no skin rash but she has some itching and she is using skin lotions at regular basis.  She denied having any recent weight loss or night sweats.  She has no fever or chills.  The patient has been tolerating her treatment with Iressa fairly well.  She is here today for evaluation after repeating CT scan of the chest for restaging of her disease.  MEDICAL HISTORY: Past Medical History:  Diagnosis Date  . Arthritis   . Atrial fibrillation ([place])   . Cataract of both eyes   . Chronic back pain   . Chronic diarrhea   . Colitis   . Hemorrhoids   . History of colon polyps   . History of migraines   . Hyperlipidemia   . Hypothyroidism   . Macular degeneration, dry   . Malignant neoplasm of right upper lobe of lung (HCC)    Stage 1A  EGFR positive  Deletion in exon 19.  ALK-negative  Right Upper Lobe Lung Mass  SUV 2.9 on PET Status post VATS and right upper lobe lobectomy 05/31/2011  . Mucus in stool   . Nephrolithiasis    1994 - passed on its on    ALLERGIES:  is  allergic to doxycycline; entocort ec [budesonide]; neurontin [gabapentin]; vicodin [hydrocodone-acetaminophen]; adhesive [tape]; amoxicillin; chocolate; codeine; levaquin [levofloxacin in d5w]; morphine and related; naprosyn [naproxen]; penicillins; prednisone; tramadol; and uncoded nonscreenable allergen.  MEDICATIONS:  Current Outpatient Medications  Medication Sig Dispense Refill  . Calcium Carbonate (CALCIUM 600 PO) Take 1 tablet by mouth 2 (two) times daily.    Marland Kitchen CARTIA XT 180 MG 24 hr capsule TAKE ONE CAPSULE BY MOUTH EVERY DAY 90 capsule 1  . diclofenac sodium (VOLTAREN) 1 % GEL Apply 1 application topically 2 (two) times daily as needed (for neck pain).     . furosemide (LASIX) 20 MG tablet TAKE 1 TABLET BY MOUTH EVERY DAY AS NEEDED FOR FLUID 90 tablet 1  . gefitinib (IRESSA) 250 MG tablet Take 1 tablet (250 mg total) by mouth daily. 30 tablet 5  . HYDROmorphone (DILAUDID) 4 MG tablet Take 1 tablet (4 mg total) by mouth every 6 (six) hours as needed for severe pain. 20 tablet 0  . loperamide (IMODIUM A-D) 2 MG tablet Take 1 mg by mouth daily as needed for diarrhea or loose stools.    . LUTEIN PO Take 1 capsule by mouth daily. Reported on 10/27/2015    . magnesium 30 MG tablet Take 30 mg by mouth at bedtime.     . mesalamine (APRISO) 0.375 g 24 hr  capsule TAKE 2 CAPSULE BY MOUTH TWICE DAILY 120 capsule 11  . Multiple Vitamins-Minerals (AIRBORNE) CHEW Chew 1 tablet by mouth daily.    . Multiple Vitamins-Minerals (HAIR/SKIN/NAILS) TABS Take 1 tablet by mouth daily.    . Multiple Vitamins-Minerals (PRESERVISION AREDS 2 PO) Take 1 tablet by mouth 2 (two) times daily with a meal.    . NON FORMULARY Eye In jection every 8-10weeks.    . Omega-3 Fatty Acids (FISH OIL) 1200 MG CAPS Take 1,200 mg by mouth daily.    . ondansetron (ZOFRAN ODT) 4 MG disintegrating tablet Take 1 tablet (4 mg total) by mouth every 8 (eight) hours as needed. 10 tablet 1  . potassium chloride (K-DUR,KLOR-CON) 10 MEQ tablet  TAKE 1 TABLET BY MOUTH EVERY DAY 90 tablet 1  . Simethicone (PHAZYME) 180 MG CAPS Take 1 capsule (180 mg total) by mouth 3 (three) times daily as needed.  0  . warfarin (COUMADIN) 5 MG tablet TAKE 1 TABLET BY MOUTH DAILY EXCEPT TAKE ONE AND ONE-HALF TABLETS ON TUESDAYS, THURSDAYS, AND SATURDAYS. 40 tablet 3   No current facility-administered medications for this visit.     SURGICAL HISTORY:  Past Surgical History:  Procedure Laterality Date  . Bladder tack  1996  . CARDIOVERSION  11/02/09   x 2  . CATARACT EXTRACTION  2010/2011  . COLONOSCOPY  3 YRS AGO  . COLONOSCOPY  03/06/2011   Procedure: COLONOSCOPY;  Surgeon: Rogene Houston, MD;  [facility];  Service: Endoscopy;  Laterality: N/A;  7:30 am  . ESOPHAGOGASTRODUODENOSCOPY    . FLEXIBLE SIGMOIDOSCOPY N/A 10/04/2012   Procedure: FLEXIBLE SIGMOIDOSCOPY;  Surgeon: Rogene Houston, MD;  [facility];  Service: Endoscopy;  Laterality: N/A;  11:00-moved to 1015 Ann to notify pt  . THYROID SURGERY  1961  . TONSILLECTOMY    . VIDEO BRONCHOSCOPY  05/31/2011   Procedure: VIDEO BRONCHOSCOPY;  Surgeon: Grace Isaac, MD;  Location[facility] OR;  Service: Thoracic;  Laterality: N/A;    REVIEW OF SYSTEMS:  Constitutional: negative Eyes: negative Ears, nose, mouth, throat, and face: negative Respiratory: negative Cardiovascular: negative Gastrointestinal: negative Genitourinary:negative Integument/breast: positive for pruritus Hematologic/lymphatic: negative Musculoskeletal:negative Neurological: negative Behavioral/Psych: negative Endocrine: negative Allergic/Immunologic: negative   PHYSICAL EXAMINATION: General appearance: alert, cooperative and no distress Head: Normocephalic, without obvious abnormality, atraumatic Neck: no adenopathy, no JVD, supple, symmetrical, trachea midline and thyroid not enlarged, symmetric, no tenderness/mass/nodules Lymph nodes: Cervical, supraclavicular, and axillary nodes normal.  Resp: clear to auscultation bilaterally Back: symmetric, no curvature. ROM normal. No CVA tenderness. Cardio: regular rate and rhythm, S1, S2 normal, no murmur, click, rub or gallop GI: soft, non-tender; bowel sounds normal; no masses,  no organomegaly Extremities: extremities normal, atraumatic, no cyanosis or edema Neurologic: Alert and oriented X 3, normal strength and tone. Normal symmetric reflexes. Normal coordination and gait  ECOG PERFORMANCE STATUS: 0 - Asymptomatic  Blood pressure (!) 141/95, pulse 72, temperature 98.2 F (36.8 C), temperature source Temporal, resp. rate 18, height 5' 3" (1.6 m), weight 106 lb 14.4 oz (48.5 kg), SpO2 96 %.  LABORATORY DATA: Lab Results  Component Value Date   WBC 5.0 01/09/2019   HGB 13.6 01/09/2019   HCT 41.3 01/09/2019   MCV 92.4 01/09/2019   PLT 285 01/09/2019      Chemistry      Component Value Date/Time   NA 137 02/12/2019 1042   NA 141 05/14/2017 1304   K 4.8 02/12/2019 1042   K 4.3  05/14/2017 1304   CL 102 02/12/2019 1042   CO2 27 02/12/2019 1042   CO2 30 (H) 05/14/2017 1304   BUN 17 02/12/2019 1042   BUN 19.0 05/14/2017 1304   CREATININE 0.71 02/12/2019 1042   CREATININE 0.78 01/09/2019 1026   CREATININE 0.8 05/14/2017 1304      Component Value Date/Time   CALCIUM 8.9 02/12/2019 1042   CALCIUM 9.1 05/14/2017 1304   ALKPHOS 81 02/12/2019 1042   ALKPHOS 86 05/14/2017 1304   AST 30 02/12/2019 1042   AST 27 01/09/2019 1026   AST 25 05/14/2017 1304   ALT 17 02/12/2019 1042   ALT 14 01/09/2019 1026   ALT 15 05/14/2017 1304   BILITOT 0.8 02/12/2019 1042   BILITOT 0.4 01/09/2019 1026   BILITOT 0.57 05/14/2017 1304       RADIOGRAPHIC STUDIES: Ct Chest W Contrast  Result Date: 03/07/2019 CLINICAL DATA:  85 year old female with history of non-small cell lung cancer. Follow-up evaluation. EXAM: CT CHEST WITH CONTRAST TECHNIQUE: Multidetector CT imaging of the chest was performed during intravenous contrast  administration. CONTRAST:  65m OMNIPAQUE IOHEXOL 300 MG/ML  SOLN COMPARISON:  Chest CT 09/04/2018. FINDINGS: Cardiovascular: Heart size is mildly enlarged with biatrial dilatation. There is no significant pericardial fluid, thickening or pericardial calcification. There is aortic atherosclerosis, as well as atherosclerosis of the great vessels of the mediastinum and the coronary arteries, including calcified atherosclerotic plaque in the left anterior descending and right coronary arteries. Mediastinum/Nodes: No pathologically enlarged mediastinal or hilar lymph nodes. Esophagus is unremarkable in appearance. No axillary lymphadenopathy. Lungs/Pleura: Status post right upper lobectomy. Compensatory hyperexpansion of the right middle and lower lobes. Previously noted left upper lobe nodule measuring 7 mm near the apex (axial image 21 of series 6) is stable. Multifocal micronodularity along the right major fissure, stable compared to prior examinations, presumably benign. No other larger more suspicious appearing pulmonary nodules or masses are noted. No acute consolidative airspace disease. No pleural effusions. A few patchy areas of peripheral predominant ground-glass attenuation are scattered throughout the lungs bilaterally, nonspecific, but similar to the priors study and potentially indicative of interstitial lung disease. Upper Abdomen: Aortic atherosclerosis. Musculoskeletal: There are no aggressive appearing lytic or blastic lesions noted in the visualized portions of the skeleton. IMPRESSION: 1. Status post right upper lobectomy with no definitive findings to suggest locally recurrent or metastatic lung cancer. All previously noted pulmonary nodules appear stable compared to the prior examination, favored to be benign. 2. Cardiomegaly with biatrial dilatation. 3. Subtle changes in the lungs suggesting interstitial lung disease, similar to the prior study. Outpatient referral to Pulmonology for further  evaluation is suggested. 4. Aortic atherosclerosis, in addition to 2 vessel coronary artery disease. Aortic Atherosclerosis (ICD10-I70.0). Electronically Signed   By: DVinnie LangtonM.D.   On: 03/07/2019 13:36    ASSESSMENT AND PLAN:  This is a very pleasant 85years old white female with recurrent non-small cell lung cancer, adenocarcinoma with positive EGFR mutation. The patient has been on treatment with Iressa 250 mg p.o. daily for the last 46 months. The patient has been tolerating her treatment well with no concerning adverse effects except for itching. She had repeat CT scan of the chest performed recently.  I personally and independently reviewed the scans and discussed the results with the patient today. Her scan showed no concerning findings for disease progression. I recommended for the patient to continue her current treatment with Iressa with the same dose. I will see her back for  follow-up visit in 2 months for evaluation with repeat blood work. For the itching she will continue with the skin lotion and also Benadryl on as-needed basis. She was also advised to monitor her blood pressure closely at home. The patient was advised to call immediately if she has any concerning symptoms in the interval. The patient voices understanding of current disease status and treatment options and is in agreement with the current care plan. All questions were answered. The patient knows to call the clinic with any problems, questions or concerns. We can certainly see the patient much sooner if necessary.  Disclaimer: This note was dictated with voice recognition software. Similar sounding words can inadvertently be transcribed and may not be corrected upon review.

## 2019-03-10 NOTE — Telephone Encounter (Signed)
Scheduled per 10/26 los, patient received after visit summary and calender.

## 2019-03-17 DIAGNOSIS — H35371 Puckering of macula, right eye: Secondary | ICD-10-CM | POA: Diagnosis not present

## 2019-03-17 DIAGNOSIS — H353231 Exudative age-related macular degeneration, bilateral, with active choroidal neovascularization: Secondary | ICD-10-CM | POA: Diagnosis not present

## 2019-03-17 DIAGNOSIS — H43391 Other vitreous opacities, right eye: Secondary | ICD-10-CM | POA: Diagnosis not present

## 2019-03-17 DIAGNOSIS — H43813 Vitreous degeneration, bilateral: Secondary | ICD-10-CM | POA: Diagnosis not present

## 2019-03-19 DIAGNOSIS — Z23 Encounter for immunization: Secondary | ICD-10-CM | POA: Diagnosis not present

## 2019-03-20 ENCOUNTER — Other Ambulatory Visit: Payer: Self-pay

## 2019-03-20 ENCOUNTER — Ambulatory Visit (INDEPENDENT_AMBULATORY_CARE_PROVIDER_SITE_OTHER): Payer: Medicare Other | Admitting: *Deleted

## 2019-03-20 DIAGNOSIS — Z5181 Encounter for therapeutic drug level monitoring: Secondary | ICD-10-CM | POA: Diagnosis not present

## 2019-03-20 DIAGNOSIS — I4891 Unspecified atrial fibrillation: Secondary | ICD-10-CM | POA: Diagnosis not present

## 2019-03-20 LAB — POCT INR: INR: 2.5 (ref 2.0–3.0)

## 2019-03-20 NOTE — Patient Instructions (Signed)
Continue coumadin 1 tablet daily except 1 1/2 tablets on Tuesdays Recheck in 6 weeks

## 2019-03-21 ENCOUNTER — Telehealth: Payer: Self-pay | Admitting: Pharmacist

## 2019-03-21 NOTE — Telephone Encounter (Signed)
Oral Oncology Pharmacist Encounter  Received call from patient with questions about AZ&me renewal application for Iressa (gefitinib). Oral oncology patient advocate will contact AZ&me and return call to patient.  Natalie Carlson, PharmD, BCPS, BCOP  03/21/2019 10:22 AM Oral Oncology Clinic [phone]

## 2019-03-21 NOTE — Telephone Encounter (Signed)
Oral Oncology Patient Advocate Encounter  Iressa patient assistance with AZ&ME will expire 05/15/19  I called AZ&ME and confirmed that the patient can call AZ&ME at [phone] and renew for 2021 over the phone.  I called the patient and gave her this information. She verbalized understanding and great appreciation.   [place] Patient [place] Phone [phone] Fax [phone] 03/21/2019   10:23 AM

## 2019-04-01 ENCOUNTER — Other Ambulatory Visit: Payer: Self-pay

## 2019-04-01 ENCOUNTER — Encounter: Payer: Self-pay | Admitting: Cardiology

## 2019-04-01 ENCOUNTER — Ambulatory Visit (INDEPENDENT_AMBULATORY_CARE_PROVIDER_SITE_OTHER): Payer: Medicare Other | Admitting: Cardiology

## 2019-04-01 VITALS — BP 125/77 | HR 60 | Ht 63.0 in | Wt 106.8 lb

## 2019-04-01 DIAGNOSIS — I4821 Permanent atrial fibrillation: Secondary | ICD-10-CM

## 2019-04-01 DIAGNOSIS — I4891 Unspecified atrial fibrillation: Secondary | ICD-10-CM

## 2019-04-01 NOTE — Progress Notes (Signed)
Cardiology Office Note  Date: 04/01/2019   ID: Natalie Carlson, DOB Oct 12, 1929, MRN [id number]  PCP:  [name], MD  Cardiologist:  Rozann Lesches, MD Electrophysiologist:  None   Chief Complaint  Patient presents with  . Atrial Fibrillation    History of Present Illness: Natalie Carlson is an 85 y.o. female last seen in September 2019.  She presents for a routine visit.  She has been doing well by report, no palpitations or chest pain.  She states that she has been social distancing, does go to church but has been very careful about washing her hands and wearing a mask.  She is on Coumadin with follow-up in the anticoagulation clinic.  Recent INR was 2.5.  She does not report any spontaneous bleeding problems.  I personally reviewed her ECG today which shows rate controlled atrial fibrillation.  She is on Cartia XT 180 mg daily for heart rate control.  Past Medical History:  Diagnosis Date  . Arthritis   . Atrial fibrillation ([place])   . Cataract of both eyes   . Chronic back pain   . Chronic diarrhea   . Colitis   . Hemorrhoids   . History of colon polyps   . History of migraines   . Hyperlipidemia   . Hypothyroidism   . Macular degeneration, dry   . Malignant neoplasm of right upper lobe of lung (HCC)    Stage 1A  EGFR positive  Deletion in exon 19.  ALK-negative  Right Upper Lobe Lung Mass  SUV 2.9 on PET Status post VATS and right upper lobe lobectomy 05/31/2011  . Mucus in stool   . Nephrolithiasis    1994 - passed on its on    Past Surgical History:  Procedure Laterality Date  . Bladder tack  1996  . CARDIOVERSION  11/02/09   x 2  . CATARACT EXTRACTION  2010/2011  . COLONOSCOPY  3 YRS AGO  . COLONOSCOPY  03/06/2011   Procedure: COLONOSCOPY;  Surgeon: Rogene Houston, MD;  [facility];  Service: Endoscopy;  Laterality: N/A;  7:30 am  . ESOPHAGOGASTRODUODENOSCOPY    . FLEXIBLE SIGMOIDOSCOPY N/A 10/04/2012   Procedure: FLEXIBLE SIGMOIDOSCOPY;   Surgeon: Rogene Houston, MD;  [facility];  Service: Endoscopy;  Laterality: N/A;  11:00-moved to 1015 Ann to notify pt  . THYROID SURGERY  1961  . TONSILLECTOMY    . VIDEO BRONCHOSCOPY  05/31/2011   Procedure: VIDEO BRONCHOSCOPY;  Surgeon: Grace Isaac, MD;  Location[facility] OR;  Service: Thoracic;  Laterality: N/A;    Current Outpatient Medications  Medication Sig Dispense Refill  . Calcium Carbonate (CALCIUM 600 PO) Take 1 tablet by mouth 2 (two) times daily.    Marland Kitchen CARTIA XT 180 MG 24 hr capsule TAKE ONE CAPSULE BY MOUTH EVERY DAY 90 capsule 1  . diclofenac sodium (VOLTAREN) 1 % GEL Apply 1 application topically 2 (two) times daily as needed (for neck pain).     . furosemide (LASIX) 20 MG tablet TAKE 1 TABLET BY MOUTH EVERY DAY AS NEEDED FOR FLUID 90 tablet 1  . gefitinib (IRESSA) 250 MG tablet Take 1 tablet (250 mg total) by mouth daily. 30 tablet 5  . HYDROmorphone (DILAUDID) 4 MG tablet Take 1 tablet (4 mg total) by mouth every 6 (six) hours as needed for severe pain. 20 tablet 0  . loperamide (IMODIUM A-D) 2 MG tablet Take 1 mg by mouth daily as needed for  diarrhea or loose stools.    . LUTEIN PO Take 1 capsule by mouth daily. Reported on 10/27/2015    . magnesium 30 MG tablet Take 30 mg by mouth at bedtime.     . mesalamine (APRISO) 0.375 g 24 hr capsule TAKE 2 CAPSULE BY MOUTH TWICE DAILY 120 capsule 11  . Multiple Vitamins-Minerals (AIRBORNE) CHEW Chew 1 tablet by mouth daily.    . Multiple Vitamins-Minerals (HAIR/SKIN/NAILS) TABS Take 1 tablet by mouth daily.    . Multiple Vitamins-Minerals (PRESERVISION AREDS 2 PO) Take 1 tablet by mouth 2 (two) times daily with a meal.    . NON FORMULARY Eye In jection every 8-10weeks.    . Omega-3 Fatty Acids (FISH OIL) 1200 MG CAPS Take 1,200 mg by mouth daily.    . ondansetron (ZOFRAN ODT) 4 MG disintegrating tablet Take 1 tablet (4 mg total) by mouth every 8 (eight) hours as needed. 10 tablet 1  . potassium chloride (KLOR-CON) 10  MEQ tablet Take 10 mEq by mouth as directed. On Tuesday, Thursday, & Saturday    . Simethicone (PHAZYME) 180 MG CAPS Take 1 capsule (180 mg total) by mouth 3 (three) times daily as needed.  0  . warfarin (COUMADIN) 5 MG tablet TAKE 1 TABLET BY MOUTH DAILY EXCEPT TAKE ONE AND ONE-HALF TABLETS ON TUESDAYS, THURSDAYS, AND SATURDAYS. 40 tablet 3   No current facility-administered medications for this visit.    Allergies:  Doxycycline, Entocort ec [budesonide], Neurontin [gabapentin], Vicodin [hydrocodone-acetaminophen], Adhesive [tape], Amoxicillin, Chocolate, Codeine, Levaquin [levofloxacin in d5w], Morphine and related, Naprosyn [naproxen], Penicillins, Prednisone, Tramadol, and Uncoded nonscreenable allergen   Social History: The patient  reports that she has never smoked. She has never used smokeless tobacco. She reports that she does not drink alcohol or use drugs.   ROS:  Please see the history of present illness. Otherwise, complete review of systems is positive for mild lower leg swelling, left worse than right.  All other systems are reviewed and negative.   Physical Exam: VS:  BP 125/77   Pulse 60   Ht _0  (1.6 m)   Wt 106 lb 12.8 oz (48.4 kg)   SpO2 96%   BMI 18.92 kg/m , BMI Body mass index is 18.92 kg/m.  Wt Readings from Last 3 Encounters:  04/01/19 106 lb 12.8 oz (48.4 kg)  03/10/19 106 lb 14.4 oz (48.5 kg)  02/25/19 107 lb 8 oz (48.8 kg)    General: Elderly woman, appears comfortable at rest. HEENT: Conjunctiva and lids normal, wearing a mask. Neck: Supple, no elevated JVP or carotid bruits, no thyromegaly. Lungs: Clear to auscultation, nonlabored breathing at rest. Cardiac: Irregularly irregular, no S3 or significant systolic murmur. Abdomen: Soft, nontender, bowel sounds present. Extremities: Mild ankle edema with venous stasis and hemosiderin deposition, distal pulses 2+.  ECG:  An ECG dated 01/23/2018 was personally reviewed today and demonstrated:  Rate controlled  atrial fibrillation.  Recent Labwork: 01/09/2019: Hemoglobin 13.6; Platelet Count 285 02/12/2019: ALT 17; AST 30; BUN 17; Creatinine, Ser 0.71; Potassium 4.8; Sodium 137   Other Studies Reviewed Today:  Echocardiogram 02/04/2014: Study Conclusions  - Left ventricle: The cavity size was normal. Wall thickness was normal. Systolic function was normal. The estimated ejection fraction was in the range of 60% to 65%. Wall motion was normal; there were no regional wall motion abnormalities. The study is not technically sufficient to allow evaluation of LV diastolic function. - Aortic valve: Mildly calcified annulus. Trileaflet. There was trivial regurgitation. -  Mitral valve: Calcified annulus. There was mild regurgitation. - Right ventricle: The cavity size was mildly to moderately dilated. Systolic function was normal. - Right atrium: The atrium was mildly dilated. Central venous pressure (est): 3 mm Hg. - Atrial septum: No defect or patent foramen ovale was identified. - Tricuspid valve: There was mild-moderate regurgitation. - Pulmonary arteries: PA peak pressure: 34 mm Hg (S). - Pericardium, extracardiac: There was no pericardial effusion.  Impressions:  - Normal LV wall thickness and chamber size with LVEF 60-65%. Indeterminate diastolic function in the setting of coarse atrial fibrillation/flutter. MAC with mild mitral regurgitation. Mild to moderate RV enlargement with normal contraction. Mild right atrial enlargement. Mild to moderate tricuspid regurgitation with PASP 34 mmHg.  Assessment and Plan:  1.  Permanent atrial fibrillation.  She is doing well without palpitations and has good heart rate control on Cartia XT.  Continue Coumadin for stroke prophylaxis.  2.  Mixed hyperlipidemia, on omega-3 supplements with follow-up by Dr. Quillian Quince.  Medication Adjustments/Labs and Tests Ordered: Current medicines are reviewed at length with the patient  today.  Concerns regarding medicines are outlined above.   Tests Ordered: Orders Placed This Encounter  Procedures  . EKG 12-Lead    Medication Changes: No orders of the defined types were placed in this encounter.   Disposition:  Follow up 1 year in the [place] office.  Signed, Satira Sark, MD, [facility] 04/01/2019 4:10 PM    [facility] [place], [place], [place] [address] Phone: [phone]; Fax: [phone]

## 2019-04-01 NOTE — Patient Instructions (Addendum)
Medication Instructions:    Your physician recommends that you continue on your current medications as directed. Please refer to the Current Medication list given to you today.  Labwork:  NONE  Testing/Procedures:  NONE  Follow-Up:  Your physician recommends that you schedule a follow-up appointment in: 1 year. You will receive a reminder letter in the mail in about 10 months reminding you to call and schedule your appointment. If you don't receive this letter, please contact our office.  Any Other Special Instructions Will Be Listed Below (If Applicable).  If you need a refill on your cardiac medications before your next appointment, please call your pharmacy.

## 2019-04-08 ENCOUNTER — Other Ambulatory Visit: Payer: Self-pay

## 2019-04-10 ENCOUNTER — Other Ambulatory Visit: Payer: Self-pay | Admitting: Cardiology

## 2019-05-01 ENCOUNTER — Other Ambulatory Visit: Payer: Self-pay | Admitting: Cardiology

## 2019-05-01 ENCOUNTER — Other Ambulatory Visit: Payer: Self-pay

## 2019-05-01 ENCOUNTER — Ambulatory Visit (INDEPENDENT_AMBULATORY_CARE_PROVIDER_SITE_OTHER): Payer: Medicare Other | Admitting: *Deleted

## 2019-05-01 DIAGNOSIS — Z5181 Encounter for therapeutic drug level monitoring: Secondary | ICD-10-CM

## 2019-05-01 DIAGNOSIS — I4891 Unspecified atrial fibrillation: Secondary | ICD-10-CM | POA: Diagnosis not present

## 2019-05-01 LAB — POCT INR: INR: 2.5 (ref 2.0–3.0)

## 2019-05-01 NOTE — Patient Instructions (Signed)
Continue coumadin 1 tablet daily except 1 1/2 tablets on Tuesdays Recheck in 6 weeks

## 2019-05-02 ENCOUNTER — Telehealth: Payer: Self-pay

## 2019-05-02 NOTE — Telephone Encounter (Addendum)
Oral Oncology Patient Advocate Encounter  Patient has been approved for Natalie Carlson and Natalie Carlson Patient Assistance Program for 2021 in an effort to reduce the patient's out of pocket expense for Iressa to $0.    AZandME patient assistance phone number for follow up is [phone].   [place] Patient [place] Phone [phone] Fax [phone] 05/02/2019 1:26 PM

## 2019-05-12 ENCOUNTER — Encounter: Payer: Self-pay | Admitting: Internal Medicine

## 2019-05-12 ENCOUNTER — Inpatient Hospital Stay: Payer: Medicare Other | Attending: Internal Medicine

## 2019-05-12 ENCOUNTER — Inpatient Hospital Stay (HOSPITAL_BASED_OUTPATIENT_CLINIC_OR_DEPARTMENT_OTHER): Payer: Medicare Other | Admitting: Internal Medicine

## 2019-05-12 ENCOUNTER — Other Ambulatory Visit: Payer: Self-pay

## 2019-05-12 VITALS — BP 121/78 | HR 79 | Temp 98.5°F | Resp 18 | Ht 63.0 in | Wt 107.6 lb

## 2019-05-12 DIAGNOSIS — Z7901 Long term (current) use of anticoagulants: Secondary | ICD-10-CM | POA: Diagnosis not present

## 2019-05-12 DIAGNOSIS — C3411 Malignant neoplasm of upper lobe, right bronchus or lung: Secondary | ICD-10-CM | POA: Insufficient documentation

## 2019-05-12 DIAGNOSIS — Z886 Allergy status to analgesic agent status: Secondary | ICD-10-CM | POA: Insufficient documentation

## 2019-05-12 DIAGNOSIS — Z5181 Encounter for therapeutic drug level monitoring: Secondary | ICD-10-CM

## 2019-05-12 DIAGNOSIS — Z79899 Other long term (current) drug therapy: Secondary | ICD-10-CM | POA: Insufficient documentation

## 2019-05-12 DIAGNOSIS — Z881 Allergy status to other antibiotic agents status: Secondary | ICD-10-CM | POA: Insufficient documentation

## 2019-05-12 DIAGNOSIS — I4891 Unspecified atrial fibrillation: Secondary | ICD-10-CM | POA: Insufficient documentation

## 2019-05-12 DIAGNOSIS — Z880 Allergy status to penicillin: Secondary | ICD-10-CM | POA: Insufficient documentation

## 2019-05-12 DIAGNOSIS — Z885 Allergy status to narcotic agent status: Secondary | ICD-10-CM | POA: Insufficient documentation

## 2019-05-12 DIAGNOSIS — M1990 Unspecified osteoarthritis, unspecified site: Secondary | ICD-10-CM | POA: Insufficient documentation

## 2019-05-12 DIAGNOSIS — Z888 Allergy status to other drugs, medicaments and biological substances status: Secondary | ICD-10-CM | POA: Insufficient documentation

## 2019-05-12 DIAGNOSIS — Z8719 Personal history of other diseases of the digestive system: Secondary | ICD-10-CM | POA: Insufficient documentation

## 2019-05-12 DIAGNOSIS — C3490 Malignant neoplasm of unspecified part of unspecified bronchus or lung: Secondary | ICD-10-CM

## 2019-05-12 LAB — CBC WITH DIFFERENTIAL (CANCER CENTER ONLY)
Abs Immature Granulocytes: 0.01 10*3/uL (ref 0.00–0.07)
Basophils Absolute: 0 10*3/uL (ref 0.0–0.1)
Basophils Relative: 1 %
Eosinophils Absolute: 0.2 10*3/uL (ref 0.0–0.5)
Eosinophils Relative: 3 %
HCT: 40 % (ref 36.0–46.0)
Hemoglobin: 13 g/dL (ref 12.0–15.0)
Immature Granulocytes: 0 %
Lymphocytes Relative: 24 %
Lymphs Abs: 1.3 10*3/uL (ref 0.7–4.0)
MCH: 30.2 pg (ref 26.0–34.0)
MCHC: 32.5 g/dL (ref 30.0–36.0)
MCV: 92.8 fL (ref 80.0–100.0)
Monocytes Absolute: 0.8 10*3/uL (ref 0.1–1.0)
Monocytes Relative: 14 %
Neutro Abs: 3.2 10*3/uL (ref 1.7–7.7)
Neutrophils Relative %: 58 %
Platelet Count: 239 10*3/uL (ref 150–400)
RBC: 4.31 MIL/uL (ref 3.87–5.11)
RDW: 13.9 % (ref 11.5–15.5)
WBC Count: 5.5 10*3/uL (ref 4.0–10.5)
nRBC: 0 % (ref 0.0–0.2)

## 2019-05-12 LAB — CMP (CANCER CENTER ONLY)
ALT: 13 U/L (ref 0–44)
AST: 24 U/L (ref 15–41)
Albumin: 3.6 g/dL (ref 3.5–5.0)
Alkaline Phosphatase: 92 U/L (ref 38–126)
Anion gap: 6 (ref 5–15)
BUN: 18 mg/dL (ref 8–23)
CO2: 30 mmol/L (ref 22–32)
Calcium: 8.6 mg/dL — ABNORMAL LOW (ref 8.9–10.3)
Chloride: 102 mmol/L (ref 98–111)
Creatinine: 0.83 mg/dL (ref 0.44–1.00)
GFR, Est AFR Am: >60 mL/min (ref >60)
GFR, Estimated: >60 mL/min (ref >60)
Glucose, Bld: 89 mg/dL (ref 70–99)
Potassium: 4.3 mmol/L (ref 3.5–5.1)
Sodium: 138 mmol/L (ref 135–145)
Total Bilirubin: 0.5 mg/dL (ref 0.3–1.2)
Total Protein: 6.9 g/dL (ref 6.5–8.1)

## 2019-05-12 NOTE — Progress Notes (Signed)
[facility] [phone]   Fax:([phone]  OFFICE PROGRESS NOTE  [name], MD [place] [address]  DIAGNOSIS: Recurrent non-small cell lung cancer presented with bilateral lung nodules in October 2016 initially diagnosed as a stage IA adenocarcinoma with positive EGFR mutation with deletion in exon 77 in December 2012.  PRIOR THERAPY: Status post bronchoscopy with right VATS and right upper lobectomy with lymph node dissection under the care of Dr. Servando Snare on 05/31/2011.  CURRENT THERAPY: Iressa 250 mg by mouth daily started 04/17/2015, status post 48 months of treatment.  INTERVAL HISTORY: Natalie Carlson 85 y.o. female returns to the clinic today for follow-up visit.  The patient is feeling fine today with no concerning complaints.  She denied having any current chest pain, shortness of breath, cough or hemoptysis.  She denied having any fever or chills.  She has no nausea, vomiting, diarrhea or constipation.  She has no headache or visual changes.  She has been tolerating her treatment with Iressa fairly well.  She is here today for evaluation and repeat blood work.  MEDICAL HISTORY: Past Medical History:  Diagnosis Date  . Arthritis   . Atrial fibrillation ([place])   . Cataract of both eyes   . Chronic back pain   . Chronic diarrhea   . Colitis   . Hemorrhoids   . History of colon polyps   . History of migraines   . Hyperlipidemia   . Hypothyroidism   . Macular degeneration, dry   . Malignant neoplasm of right upper lobe of lung (HCC)    Stage 1A  EGFR positive  Deletion in exon 19.  ALK-negative  Right Upper Lobe Lung Mass  SUV 2.9 on PET Status post VATS and right upper lobe lobectomy 05/31/2011  . Mucus in stool   . Nephrolithiasis    1994 - passed on its on    ALLERGIES:  is allergic to doxycycline; entocort ec [budesonide]; neurontin [gabapentin]; vicodin [hydrocodone-acetaminophen]; adhesive [tape]; amoxicillin;  chocolate; codeine; levaquin [levofloxacin in d5w]; morphine and related; naprosyn [naproxen]; penicillins; prednisone; tramadol; and uncoded nonscreenable allergen.  MEDICATIONS:  Current Outpatient Medications  Medication Sig Dispense Refill  . Calcium Carbonate (CALCIUM 600 PO) Take 1 tablet by mouth 2 (two) times daily.    Marland Kitchen CARTIA XT 180 MG 24 hr capsule TAKE ONE CAPSULE BY MOUTH EVERY DAY 90 capsule 1  . diclofenac sodium (VOLTAREN) 1 % GEL Apply 1 application topically 2 (two) times daily as needed (for neck pain).     . furosemide (LASIX) 20 MG tablet TAKE 1 TABLET BY MOUTH EVERY DAY AS NEEDED FOR FLUID 90 tablet 1  . gefitinib (IRESSA) 250 MG tablet Take 1 tablet (250 mg total) by mouth daily. 30 tablet 5  . HYDROmorphone (DILAUDID) 4 MG tablet Take 1 tablet (4 mg total) by mouth every 6 (six) hours as needed for severe pain. 20 tablet 0  . loperamide (IMODIUM A-D) 2 MG tablet Take 1 mg by mouth daily as needed for diarrhea or loose stools.    . LUTEIN PO Take 1 capsule by mouth daily. Reported on 10/27/2015    . magnesium 30 MG tablet Take 30 mg by mouth at bedtime.     . mesalamine (APRISO) 0.375 g 24 hr capsule TAKE 2 CAPSULE BY MOUTH TWICE DAILY 120 capsule 11  . Multiple Vitamins-Minerals (AIRBORNE) CHEW Chew 1 tablet by mouth daily.    . Multiple Vitamins-Minerals (HAIR/SKIN/NAILS)  TABS Take 1 tablet by mouth daily.    . Multiple Vitamins-Minerals (PRESERVISION AREDS 2 PO) Take 1 tablet by mouth 2 (two) times daily with a meal.    . NON FORMULARY Eye In jection every 8-10weeks.    . Omega-3 Fatty Acids (FISH OIL) 1200 MG CAPS Take 1,200 mg by mouth daily.    . ondansetron (ZOFRAN ODT) 4 MG disintegrating tablet Take 1 tablet (4 mg total) by mouth every 8 (eight) hours as needed. 10 tablet 1  . potassium chloride (KLOR-CON) 10 MEQ tablet TAKE 1 TABLET BY MOUTH EVERY DAY 90 tablet 2  . Simethicone (PHAZYME) 180 MG CAPS Take 1 capsule (180 mg total) by mouth 3 (three) times daily as  needed.  0  . warfarin (COUMADIN) 5 MG tablet TAKE 1 TABLET BY MOUTH DAILY EXCEPT TAKE ONE AND ONE-HALF TABLETS ON TUESDAYS, THURSDAYS, AND SATURDAYS. 40 tablet 3   No current facility-administered medications for this visit.    SURGICAL HISTORY:  Past Surgical History:  Procedure Laterality Date  . Bladder tack  1996  . CARDIOVERSION  11/02/09   x 2  . CATARACT EXTRACTION  2010/2011  . COLONOSCOPY  3 YRS AGO  . COLONOSCOPY  03/06/2011   Procedure: COLONOSCOPY;  Surgeon: Rogene Houston, MD;  [facility];  Service: Endoscopy;  Laterality: N/A;  7:30 am  . ESOPHAGOGASTRODUODENOSCOPY    . FLEXIBLE SIGMOIDOSCOPY N/A 10/04/2012   Procedure: FLEXIBLE SIGMOIDOSCOPY;  Surgeon: Rogene Houston, MD;  [facility];  Service: Endoscopy;  Laterality: N/A;  11:00-moved to 1015 Ann to notify pt  . THYROID SURGERY  1961  . TONSILLECTOMY    . VIDEO BRONCHOSCOPY  05/31/2011   Procedure: VIDEO BRONCHOSCOPY;  Surgeon: Grace Isaac, MD;  Location: [facility] OR;  Service: Thoracic;  Laterality: N/A;    REVIEW OF SYSTEMS:  A comprehensive review of systems was negative.   PHYSICAL EXAMINATION: General appearance: alert, cooperative and no distress Head: Normocephalic, without obvious abnormality, atraumatic Neck: no adenopathy, no JVD, supple, symmetrical, trachea midline and thyroid not enlarged, symmetric, no tenderness/mass/nodules Lymph nodes: Cervical, supraclavicular, and axillary nodes normal. Resp: clear to auscultation bilaterally Back: symmetric, no curvature. ROM normal. No CVA tenderness. Cardio: regular rate and rhythm, S1, S2 normal, no murmur, click, rub or gallop GI: soft, non-tender; bowel sounds normal; no masses,  no organomegaly Extremities: extremities normal, atraumatic, no cyanosis or edema  ECOG PERFORMANCE STATUS: 0 - Asymptomatic  Blood pressure 121/78, pulse 79, temperature 98.5 F (36.9 C), temperature source Temporal, resp. rate 18, height 5' 3"  (1.6  m), weight 107 lb 9.6 oz (48.8 kg), SpO2 100 %.  LABORATORY DATA: Lab Results  Component Value Date   WBC 5.0 01/09/2019   HGB 13.6 01/09/2019   HCT 41.3 01/09/2019   MCV 92.4 01/09/2019   PLT 285 01/09/2019      Chemistry      Component Value Date/Time   NA 137 02/12/2019 1042   NA 141 05/14/2017 1304   K 4.8 02/12/2019 1042   K 4.3 05/14/2017 1304   CL 102 02/12/2019 1042   CO2 27 02/12/2019 1042   CO2 30 (H) 05/14/2017 1304   BUN 17 02/12/2019 1042   BUN 19.0 05/14/2017 1304   CREATININE 0.71 02/12/2019 1042   CREATININE 0.78 01/09/2019 1026   CREATININE 0.8 05/14/2017 1304      Component Value Date/Time   CALCIUM 8.9 02/12/2019 1042   CALCIUM 9.1 05/14/2017 1304   ALKPHOS 81  02/12/2019 1042   ALKPHOS 86 05/14/2017 1304   AST 30 02/12/2019 1042   AST 27 01/09/2019 1026   AST 25 05/14/2017 1304   ALT 17 02/12/2019 1042   ALT 14 01/09/2019 1026   ALT 15 05/14/2017 1304   BILITOT 0.8 02/12/2019 1042   BILITOT 0.4 01/09/2019 1026   BILITOT 0.57 05/14/2017 1304       RADIOGRAPHIC STUDIES: No results found.  ASSESSMENT AND PLAN:  This is a very pleasant 85 years old white female with recurrent non-small cell lung cancer, adenocarcinoma with positive EGFR mutation. The patient has been on treatment with Iressa 250 mg p.o. daily for the last 48 months. She has been tolerating her treatment well with no concerning adverse effects. CBC today is unremarkable.  Comprehensive metabolic panel is still pending. I recommended for the patient to continue her current treatment with Iressa with the same dose. I will see her back for follow-up visit in 2 months for evaluation with repeat blood work. She was advised to call immediately if she has any concerning symptoms in the interval. The patient voices understanding of current disease status and treatment options and is in agreement with the current care plan. All questions were answered. The patient knows to call the  clinic with any problems, questions or concerns. We can certainly see the patient much sooner if necessary.  Disclaimer: This note was dictated with voice recognition software. Similar sounding words can inadvertently be transcribed and may not be corrected upon review.

## 2019-05-22 ENCOUNTER — Encounter: Payer: Self-pay | Admitting: *Deleted

## 2019-05-22 ENCOUNTER — Telehealth: Payer: Self-pay | Admitting: Cardiology

## 2019-05-22 NOTE — Telephone Encounter (Signed)
COVIDVACCINE   Patient called stating that the [facility] Department is requesting a note from the doctor that it is ok for patient to take the vaccine due to her being on coumdin.

## 2019-05-22 NOTE — Progress Notes (Signed)
Healthcare Provider Consent to Vaccinate patient on anticoagulation therapy.  Medication/Medical Condition of concern:  None  We recommend that this patient receive the COVID-19 vaccine   Thank you,  Dr. Inocente Salles [place]/Kordel Leavy Joneen Caraway RN

## 2019-05-22 NOTE — Telephone Encounter (Signed)
Pt notified/lr

## 2019-05-22 NOTE — Telephone Encounter (Signed)
Healthcare Provider Consent to Vaccinate patient on anticoagulation therapy.  Medication/Medical Condition of concern:  We recommend that this patient receive the COVID-19 vaccine   Thank you,

## 2019-05-28 DIAGNOSIS — Z23 Encounter for immunization: Secondary | ICD-10-CM | POA: Diagnosis not present

## 2019-06-05 ENCOUNTER — Telehealth: Payer: Self-pay | Admitting: Internal Medicine

## 2019-06-05 NOTE — Telephone Encounter (Signed)
Scheduled appt per 1/21 sch message - pt is aware of appt date and time

## 2019-06-12 ENCOUNTER — Other Ambulatory Visit: Payer: Self-pay

## 2019-06-12 ENCOUNTER — Ambulatory Visit (INDEPENDENT_AMBULATORY_CARE_PROVIDER_SITE_OTHER): Payer: Medicare Other | Admitting: *Deleted

## 2019-06-12 DIAGNOSIS — Z5181 Encounter for therapeutic drug level monitoring: Secondary | ICD-10-CM

## 2019-06-12 DIAGNOSIS — I4891 Unspecified atrial fibrillation: Secondary | ICD-10-CM | POA: Diagnosis not present

## 2019-06-12 LAB — POCT INR: INR: 2.5 (ref 2.0–3.0)

## 2019-06-12 NOTE — Patient Instructions (Signed)
Continue coumadin 1 tablet daily except 1 1/2 tablets on Tuesdays Recheck in 6 weeks

## 2019-06-27 DIAGNOSIS — Z23 Encounter for immunization: Secondary | ICD-10-CM | POA: Diagnosis not present

## 2019-07-01 ENCOUNTER — Other Ambulatory Visit: Payer: Self-pay | Admitting: Cardiovascular Disease

## 2019-07-14 ENCOUNTER — Telehealth: Payer: Self-pay | Admitting: Internal Medicine

## 2019-07-14 ENCOUNTER — Inpatient Hospital Stay: Payer: Medicare Other | Attending: Internal Medicine | Admitting: Internal Medicine

## 2019-07-14 ENCOUNTER — Inpatient Hospital Stay: Payer: Medicare Other

## 2019-07-14 ENCOUNTER — Other Ambulatory Visit: Payer: Self-pay

## 2019-07-14 ENCOUNTER — Encounter: Payer: Self-pay | Admitting: Internal Medicine

## 2019-07-14 VITALS — BP 143/80 | HR 86 | Temp 98.0°F | Resp 20 | Ht 63.0 in | Wt 106.6 lb

## 2019-07-14 DIAGNOSIS — Z888 Allergy status to other drugs, medicaments and biological substances status: Secondary | ICD-10-CM | POA: Insufficient documentation

## 2019-07-14 DIAGNOSIS — Z5181 Encounter for therapeutic drug level monitoring: Secondary | ICD-10-CM | POA: Diagnosis not present

## 2019-07-14 DIAGNOSIS — M1990 Unspecified osteoarthritis, unspecified site: Secondary | ICD-10-CM | POA: Diagnosis not present

## 2019-07-14 DIAGNOSIS — I4891 Unspecified atrial fibrillation: Secondary | ICD-10-CM | POA: Insufficient documentation

## 2019-07-14 DIAGNOSIS — Z885 Allergy status to narcotic agent status: Secondary | ICD-10-CM | POA: Insufficient documentation

## 2019-07-14 DIAGNOSIS — Z8719 Personal history of other diseases of the digestive system: Secondary | ICD-10-CM | POA: Diagnosis not present

## 2019-07-14 DIAGNOSIS — C3490 Malignant neoplasm of unspecified part of unspecified bronchus or lung: Secondary | ICD-10-CM

## 2019-07-14 DIAGNOSIS — Z880 Allergy status to penicillin: Secondary | ICD-10-CM | POA: Insufficient documentation

## 2019-07-14 DIAGNOSIS — Z79899 Other long term (current) drug therapy: Secondary | ICD-10-CM | POA: Insufficient documentation

## 2019-07-14 DIAGNOSIS — C3411 Malignant neoplasm of upper lobe, right bronchus or lung: Secondary | ICD-10-CM | POA: Insufficient documentation

## 2019-07-14 DIAGNOSIS — Z881 Allergy status to other antibiotic agents status: Secondary | ICD-10-CM | POA: Insufficient documentation

## 2019-07-14 DIAGNOSIS — Z886 Allergy status to analgesic agent status: Secondary | ICD-10-CM | POA: Diagnosis not present

## 2019-07-14 DIAGNOSIS — Z7901 Long term (current) use of anticoagulants: Secondary | ICD-10-CM | POA: Insufficient documentation

## 2019-07-14 LAB — CBC WITH DIFFERENTIAL (CANCER CENTER ONLY)
Abs Immature Granulocytes: 0.04 10*3/uL (ref 0.00–0.07)
Basophils Absolute: 0 10*3/uL (ref 0.0–0.1)
Basophils Relative: 1 %
Eosinophils Absolute: 0.2 10*3/uL (ref 0.0–0.5)
Eosinophils Relative: 4 %
HCT: 43.4 % (ref 36.0–46.0)
Hemoglobin: 14.1 g/dL (ref 12.0–15.0)
Immature Granulocytes: 1 %
Lymphocytes Relative: 28 %
Lymphs Abs: 1.3 10*3/uL (ref 0.7–4.0)
MCH: 29.4 pg (ref 26.0–34.0)
MCHC: 32.5 g/dL (ref 30.0–36.0)
MCV: 90.6 fL (ref 80.0–100.0)
Monocytes Absolute: 0.6 10*3/uL (ref 0.1–1.0)
Monocytes Relative: 14 %
Neutro Abs: 2.3 10*3/uL (ref 1.7–7.7)
Neutrophils Relative %: 52 %
Platelet Count: 262 10*3/uL (ref 150–400)
RBC: 4.79 MIL/uL (ref 3.87–5.11)
RDW: 14.1 % (ref 11.5–15.5)
WBC Count: 4.5 10*3/uL (ref 4.0–10.5)
nRBC: 0 % (ref 0.0–0.2)

## 2019-07-14 LAB — CMP (CANCER CENTER ONLY)
ALT: 15 U/L (ref 0–44)
AST: 29 U/L (ref 15–41)
Albumin: 4 g/dL (ref 3.5–5.0)
Alkaline Phosphatase: 95 U/L (ref 38–126)
Anion gap: 8 (ref 5–15)
BUN: 16 mg/dL (ref 8–23)
CO2: 31 mmol/L (ref 22–32)
Calcium: 9.6 mg/dL (ref 8.9–10.3)
Chloride: 101 mmol/L (ref 98–111)
Creatinine: 0.81 mg/dL (ref 0.44–1.00)
GFR, Est AFR Am: >60 mL/min (ref >60)
GFR, Estimated: >60 mL/min (ref >60)
Glucose, Bld: 96 mg/dL (ref 70–99)
Potassium: 4.6 mmol/L (ref 3.5–5.1)
Sodium: 140 mmol/L (ref 135–145)
Total Bilirubin: 0.7 mg/dL (ref 0.3–1.2)
Total Protein: 7.8 g/dL (ref 6.5–8.1)

## 2019-07-14 NOTE — Progress Notes (Signed)
[place] Telephone:([phone]) [phone]   Fax:(336) [phone]  OFFICE PROGRESS NOTE  [name], MD [place] [address]  DIAGNOSIS: Recurrent non-small cell lung cancer presented with bilateral lung nodules in October 2016 initially diagnosed as a stage IA adenocarcinoma with positive EGFR mutation with deletion in exon 9 in December 2012.  PRIOR THERAPY: Status post bronchoscopy with right VATS and right upper lobectomy with lymph node dissection under the care of Dr. Servando Snare on 05/31/2011.  CURRENT THERAPY: Iressa 250 mg by mouth daily started 04/17/2015, status post 50 months of treatment.  INTERVAL HISTORY: Natalie Carlson 86 y.o. female returns to the clinic today for follow-up visit.  The patient is feeling fine today with no concerning complaints.  She has been tolerating her treatment with Iressa fairly well.  She denied having any skin rash or diarrhea.  She has dry skin.  She denied having any chest pain, shortness of breath, cough or hemoptysis.  She denied having any fever or chills.  She has no nausea, vomiting, diarrhea or constipation.  She has no headache or visual changes.  She is here today for evaluation and repeat blood work.  MEDICAL HISTORY: Past Medical History:  Diagnosis Date  . Arthritis   . Atrial fibrillation ([place])   . Cataract of both eyes   . Chronic back pain   . Chronic diarrhea   . Colitis   . Hemorrhoids   . History of colon polyps   . History of migraines   . Hyperlipidemia   . Hypothyroidism   . Macular degeneration, dry   . Malignant neoplasm of right upper lobe of lung (HCC)    Stage 1A  EGFR positive  Deletion in exon 19.  ALK-negative  Right Upper Lobe Lung Mass  SUV 2.9 on PET Status post VATS and right upper lobe lobectomy 05/31/2011  . Mucus in stool   . Nephrolithiasis    1994 - passed on its on    ALLERGIES:  is allergic to doxycycline; entocort ec [budesonide]; neurontin [gabapentin]; vicodin  [hydrocodone-acetaminophen]; adhesive [tape]; amoxicillin; chocolate; codeine; levaquin [levofloxacin in d5w]; morphine and related; naprosyn [naproxen]; penicillins; prednisone; tramadol; and uncoded nonscreenable allergen.  MEDICATIONS:  Current Outpatient Medications  Medication Sig Dispense Refill  . Calcium Carbonate (CALCIUM 600 PO) Take 1 tablet by mouth 2 (two) times daily.    Marland Kitchen CARTIA XT 180 MG 24 hr capsule TAKE ONE CAPSULE BY MOUTH EVERY DAY 90 capsule 1  . diclofenac sodium (VOLTAREN) 1 % GEL Apply 1 application topically 2 (two) times daily as needed (for neck pain).     . furosemide (LASIX) 20 MG tablet TAKE 1 TABLET BY MOUTH EVERY DAY AS NEEDED FOR FLUID 90 tablet 1  . gefitinib (IRESSA) 250 MG tablet Take 1 tablet (250 mg total) by mouth daily. 30 tablet 5  . HYDROmorphone (DILAUDID) 4 MG tablet Take 1 tablet (4 mg total) by mouth every 6 (six) hours as needed for severe pain. 20 tablet 0  . loperamide (IMODIUM A-D) 2 MG tablet Take 1 mg by mouth daily as needed for diarrhea or loose stools.    . LUTEIN PO Take 1 capsule by mouth daily. Reported on 10/27/2015    . magnesium 30 MG tablet Take 30 mg by mouth at bedtime.     . mesalamine (APRISO) 0.375 g 24 hr capsule TAKE 2 CAPSULE BY MOUTH TWICE DAILY 120 capsule 11  . Multiple Vitamins-Minerals (AIRBORNE) CHEW  Chew 1 tablet by mouth daily.    . Multiple Vitamins-Minerals (HAIR/SKIN/NAILS) TABS Take 1 tablet by mouth daily.    . Multiple Vitamins-Minerals (PRESERVISION AREDS 2 PO) Take 1 tablet by mouth 2 (two) times daily with a meal.    . NON FORMULARY Eye In jection every 8-10weeks.    . Omega-3 Fatty Acids (FISH OIL) 1200 MG CAPS Take 1,200 mg by mouth daily.    . ondansetron (ZOFRAN ODT) 4 MG disintegrating tablet Take 1 tablet (4 mg total) by mouth every 8 (eight) hours as needed. 10 tablet 1  . potassium chloride (KLOR-CON) 10 MEQ tablet TAKE 1 TABLET BY MOUTH EVERY DAY 90 tablet 2  . Simethicone (PHAZYME) 180 MG CAPS Take  1 capsule (180 mg total) by mouth 3 (three) times daily as needed.  0  . warfarin (COUMADIN) 5 MG tablet TAKE 1 TABLET BY MOUTH DAILY EXCEPT TAKE ONE AND 1/2 TABLETS ON TUESDAYS OR AS DIRECTED 40 tablet 3   No current facility-administered medications for this visit.    SURGICAL HISTORY:  Past Surgical History:  Procedure Laterality Date  . Bladder tack  1996  . CARDIOVERSION  11/02/09   x 2  . CATARACT EXTRACTION  2010/2011  . COLONOSCOPY  3 YRS AGO  . COLONOSCOPY  03/06/2011   Procedure: COLONOSCOPY;  Surgeon: Rogene Houston, MD;  [facility];  Service: Endoscopy;  Laterality: N/A;  7:30 am  . ESOPHAGOGASTRODUODENOSCOPY    . FLEXIBLE SIGMOIDOSCOPY N/A 10/04/2012   Procedure: FLEXIBLE SIGMOIDOSCOPY;  Surgeon: Rogene Houston, MD;  [facility];  Service: Endoscopy;  Laterality: N/A;  11:00-moved to 1015 Ann to notify pt  . THYROID SURGERY  1961  . TONSILLECTOMY    . VIDEO BRONCHOSCOPY  05/31/2011   Procedure: VIDEO BRONCHOSCOPY;  Surgeon: Grace Isaac, MD;  Location: [facility] OR;  Service: Thoracic;  Laterality: N/A;    REVIEW OF SYSTEMS:  A comprehensive review of systems was negative.   PHYSICAL EXAMINATION: General appearance: alert, cooperative and no distress Head: Normocephalic, without obvious abnormality, atraumatic Neck: no adenopathy, no JVD, supple, symmetrical, trachea midline and thyroid not enlarged, symmetric, no tenderness/mass/nodules Lymph nodes: Cervical, supraclavicular, and axillary nodes normal. Resp: clear to auscultation bilaterally Back: symmetric, no curvature. ROM normal. No CVA tenderness. Cardio: regular rate and rhythm, S1, S2 normal, no murmur, click, rub or gallop GI: soft, non-tender; bowel sounds normal; no masses,  no organomegaly Extremities: extremities normal, atraumatic, no cyanosis or edema  ECOG PERFORMANCE STATUS: 0 - Asymptomatic  Blood pressure (!) 143/80, pulse 86, temperature 98 F (36.7 C), temperature source  Temporal, resp. rate 20, height _0  (1.6 m), weight 106 lb 9.6 oz (48.4 kg), SpO2 97 %.  LABORATORY DATA: Lab Results  Component Value Date   WBC 4.5 07/14/2019   HGB 14.1 07/14/2019   HCT 43.4 07/14/2019   MCV 90.6 07/14/2019   PLT 262 07/14/2019      Chemistry      Component Value Date/Time   NA 138 05/12/2019 1303   NA 141 05/14/2017 1304   K 4.3 05/12/2019 1303   K 4.3 05/14/2017 1304   CL 102 05/12/2019 1303   CO2 30 05/12/2019 1303   CO2 30 (H) 05/14/2017 1304   BUN 18 05/12/2019 1303   BUN 19.0 05/14/2017 1304   CREATININE 0.83 05/12/2019 1303   CREATININE 0.8 05/14/2017 1304      Component Value Date/Time   CALCIUM 8.6 (L) 05/12/2019 1303  CALCIUM 9.1 05/14/2017 1304   ALKPHOS 92 05/12/2019 1303   ALKPHOS 86 05/14/2017 1304   AST 24 05/12/2019 1303   AST 25 05/14/2017 1304   ALT 13 05/12/2019 1303   ALT 15 05/14/2017 1304   BILITOT 0.5 05/12/2019 1303   BILITOT 0.57 05/14/2017 1304       RADIOGRAPHIC STUDIES: No results found.  ASSESSMENT AND PLAN:  This is a very pleasant 86 years old white female with recurrent non-small cell lung cancer, adenocarcinoma with positive EGFR mutation. The patient has been on treatment with Iressa 250 mg p.o. daily for the last 50 months. The patient continues to tolerate this treatment well with no concerning adverse effects. Her CBC is unremarkable today.  I recommended for the patient to continue her treatment as planned. I will see her back for follow-up visit in 2 months for evaluation with repeat CT scan of the chest for restaging of her disease. She was advised to call immediately if she has any concerning symptoms in the interval. The patient voices understanding of current disease status and treatment options and is in agreement with the current care plan. All questions were answered. The patient knows to call the clinic with any problems, questions or concerns. We can certainly see the patient much sooner if  necessary.  Disclaimer: This note was dictated with voice recognition software. Similar sounding words can inadvertently be transcribed and may not be corrected upon review.

## 2019-07-14 NOTE — Telephone Encounter (Signed)
Scheduled follow up per 3/1 los. Gave pt a print out of calender.

## 2019-07-21 DIAGNOSIS — H35371 Puckering of macula, right eye: Secondary | ICD-10-CM | POA: Diagnosis not present

## 2019-07-21 DIAGNOSIS — H353231 Exudative age-related macular degeneration, bilateral, with active choroidal neovascularization: Secondary | ICD-10-CM | POA: Diagnosis not present

## 2019-07-21 DIAGNOSIS — H43813 Vitreous degeneration, bilateral: Secondary | ICD-10-CM | POA: Diagnosis not present

## 2019-07-24 ENCOUNTER — Other Ambulatory Visit: Payer: Self-pay

## 2019-07-24 ENCOUNTER — Ambulatory Visit (INDEPENDENT_AMBULATORY_CARE_PROVIDER_SITE_OTHER): Payer: Medicare Other | Admitting: *Deleted

## 2019-07-24 DIAGNOSIS — I4891 Unspecified atrial fibrillation: Secondary | ICD-10-CM | POA: Diagnosis not present

## 2019-07-24 DIAGNOSIS — Z5181 Encounter for therapeutic drug level monitoring: Secondary | ICD-10-CM | POA: Diagnosis not present

## 2019-07-24 LAB — POCT INR: INR: 2 (ref 2.0–3.0)

## 2019-07-24 NOTE — Patient Instructions (Signed)
Take 1 1/2 tablets tonight then resume 1 tablet daily except 1 1/2 tablets on Tuesdays Recheck in 6 weeks

## 2019-09-04 ENCOUNTER — Other Ambulatory Visit: Payer: Self-pay

## 2019-09-04 ENCOUNTER — Ambulatory Visit (INDEPENDENT_AMBULATORY_CARE_PROVIDER_SITE_OTHER): Payer: Medicare Other | Admitting: *Deleted

## 2019-09-04 DIAGNOSIS — I4891 Unspecified atrial fibrillation: Secondary | ICD-10-CM

## 2019-09-04 DIAGNOSIS — Z5181 Encounter for therapeutic drug level monitoring: Secondary | ICD-10-CM

## 2019-09-04 LAB — POCT INR: INR: 1.9 — AB (ref 2.0–3.0)

## 2019-09-04 NOTE — Patient Instructions (Signed)
Take 1 1/2 tablets tonight then increase dose to 1 tablet daily except 1 1/2 tablets on Tuesdays and Fridays Recheck in 6 weeks

## 2019-09-05 ENCOUNTER — Other Ambulatory Visit: Payer: Self-pay | Admitting: *Deleted

## 2019-09-05 DIAGNOSIS — C3490 Malignant neoplasm of unspecified part of unspecified bronchus or lung: Secondary | ICD-10-CM

## 2019-09-05 MED ORDER — IRESSA 250 MG PO TABS: 250.0000 mg | ORAL_TABLET | Freq: Every day | ORAL | 5 refills | Status: DC

## 2019-09-10 ENCOUNTER — Other Ambulatory Visit (HOSPITAL_COMMUNITY)
Admission: RE | Admit: 2019-09-10 | Discharge: 2019-09-10 | Disposition: A | Discharge status: Home / Self Care | Payer: Medicare Other | Source: Ambulatory Visit | Attending: Internal Medicine | Admitting: Internal Medicine

## 2019-09-10 ENCOUNTER — Other Ambulatory Visit: Payer: Self-pay

## 2019-09-10 DIAGNOSIS — C3490 Malignant neoplasm of unspecified part of unspecified bronchus or lung: Secondary | ICD-10-CM | POA: Insufficient documentation

## 2019-09-10 LAB — CBC WITH DIFFERENTIAL/PLATELET
Abs Immature Granulocytes: 0.01 10*3/uL (ref 0.00–0.07)
Basophils Absolute: 0 10*3/uL (ref 0.0–0.1)
Basophils Relative: 1 %
Eosinophils Absolute: 0.2 10*3/uL (ref 0.0–0.5)
Eosinophils Relative: 6 %
HCT: 44.8 % (ref 36.0–46.0)
Hemoglobin: 14.2 g/dL (ref 12.0–15.0)
Immature Granulocytes: 0 %
Lymphocytes Relative: 35 %
Lymphs Abs: 1.3 10*3/uL (ref 0.7–4.0)
MCH: 30 pg (ref 26.0–34.0)
MCHC: 31.7 g/dL (ref 30.0–36.0)
MCV: 94.5 fL (ref 80.0–100.0)
Monocytes Absolute: 0.7 10*3/uL (ref 0.1–1.0)
Monocytes Relative: 17 %
Neutro Abs: 1.6 10*3/uL — ABNORMAL LOW (ref 1.7–7.7)
Neutrophils Relative %: 41 %
Platelets: 222 10*3/uL (ref 150–400)
RBC: 4.74 MIL/uL (ref 3.87–5.11)
RDW: 14.7 % (ref 11.5–15.5)
WBC: 3.8 10*3/uL — ABNORMAL LOW (ref 4.0–10.5)
nRBC: 0 % (ref 0.0–0.2)

## 2019-09-10 LAB — COMPREHENSIVE METABOLIC PANEL
ALT: 22 U/L (ref 0–44)
AST: 33 U/L (ref 15–41)
Albumin: 3.8 g/dL (ref 3.5–5.0)
Alkaline Phosphatase: 81 U/L (ref 38–126)
Anion gap: 11 (ref 5–15)
BUN: 17 mg/dL (ref 8–23)
CO2: 28 mmol/L (ref 22–32)
Calcium: 9.5 mg/dL (ref 8.9–10.3)
Chloride: 101 mmol/L (ref 98–111)
Creatinine, Ser: 0.7 mg/dL (ref 0.44–1.00)
GFR calc Af Amer: >60 mL/min (ref >60)
GFR calc non Af Amer: >60 mL/min (ref >60)
Glucose, Bld: 87 mg/dL (ref 70–99)
Potassium: 4.5 mmol/L (ref 3.5–5.1)
Sodium: 140 mmol/L (ref 135–145)
Total Bilirubin: 0.4 mg/dL (ref 0.3–1.2)
Total Protein: 7.2 g/dL (ref 6.5–8.1)

## 2019-09-12 ENCOUNTER — Ambulatory Visit (HOSPITAL_COMMUNITY)
Admission: RE | Admit: 2019-09-12 | Discharge: 2019-09-12 | Disposition: A | Discharge status: Home / Self Care | Payer: Medicare Other | Source: Ambulatory Visit | Attending: Internal Medicine | Admitting: Internal Medicine

## 2019-09-12 ENCOUNTER — Other Ambulatory Visit: Payer: Self-pay

## 2019-09-12 DIAGNOSIS — C3490 Malignant neoplasm of unspecified part of unspecified bronchus or lung: Secondary | ICD-10-CM | POA: Diagnosis not present

## 2019-09-12 MED ORDER — IOHEXOL 300 MG/ML  SOLN
75.0000 mL | Freq: Once | INTRAMUSCULAR | Status: AC | PRN
  Administered 2019-09-12: 75 mL via INTRAVENOUS

## 2019-09-22 ENCOUNTER — Other Ambulatory Visit: Payer: Self-pay

## 2019-09-22 ENCOUNTER — Other Ambulatory Visit: Payer: Self-pay | Admitting: Medical Oncology

## 2019-09-22 ENCOUNTER — Inpatient Hospital Stay: Payer: Medicare Other | Attending: Internal Medicine | Admitting: Internal Medicine

## 2019-09-22 ENCOUNTER — Telehealth: Payer: Self-pay | Admitting: Internal Medicine

## 2019-09-22 ENCOUNTER — Encounter: Payer: Self-pay | Admitting: Internal Medicine

## 2019-09-22 VITALS — BP 136/73 | HR 92 | Temp 98.7°F | Resp 18 | Ht 63.0 in | Wt 107.6 lb

## 2019-09-22 DIAGNOSIS — C3411 Malignant neoplasm of upper lobe, right bronchus or lung: Secondary | ICD-10-CM | POA: Insufficient documentation

## 2019-09-22 DIAGNOSIS — Z888 Allergy status to other drugs, medicaments and biological substances status: Secondary | ICD-10-CM | POA: Insufficient documentation

## 2019-09-22 DIAGNOSIS — E041 Nontoxic single thyroid nodule: Secondary | ICD-10-CM | POA: Diagnosis not present

## 2019-09-22 DIAGNOSIS — M1990 Unspecified osteoarthritis, unspecified site: Secondary | ICD-10-CM | POA: Diagnosis not present

## 2019-09-22 DIAGNOSIS — Z7901 Long term (current) use of anticoagulants: Secondary | ICD-10-CM | POA: Diagnosis not present

## 2019-09-22 DIAGNOSIS — Z881 Allergy status to other antibiotic agents status: Secondary | ICD-10-CM | POA: Insufficient documentation

## 2019-09-22 DIAGNOSIS — K121 Other forms of stomatitis: Secondary | ICD-10-CM

## 2019-09-22 DIAGNOSIS — Z8719 Personal history of other diseases of the digestive system: Secondary | ICD-10-CM | POA: Insufficient documentation

## 2019-09-22 DIAGNOSIS — Z79899 Other long term (current) drug therapy: Secondary | ICD-10-CM | POA: Diagnosis not present

## 2019-09-22 DIAGNOSIS — Z5181 Encounter for therapeutic drug level monitoring: Secondary | ICD-10-CM | POA: Diagnosis not present

## 2019-09-22 DIAGNOSIS — I480 Paroxysmal atrial fibrillation: Secondary | ICD-10-CM | POA: Diagnosis not present

## 2019-09-22 DIAGNOSIS — Z880 Allergy status to penicillin: Secondary | ICD-10-CM | POA: Diagnosis not present

## 2019-09-22 DIAGNOSIS — I4891 Unspecified atrial fibrillation: Secondary | ICD-10-CM | POA: Diagnosis not present

## 2019-09-22 DIAGNOSIS — Z885 Allergy status to narcotic agent status: Secondary | ICD-10-CM | POA: Insufficient documentation

## 2019-09-22 DIAGNOSIS — Z886 Allergy status to analgesic agent status: Secondary | ICD-10-CM | POA: Diagnosis not present

## 2019-09-22 MED ORDER — MAGIC MOUTHWASH: 5.0000 mL | Freq: Four times a day (QID) | ORAL | 0 refills | Status: DC | PRN

## 2019-09-22 NOTE — Progress Notes (Signed)
[place] Telephone:(336) [phone]   Fax:(336) [phone]  OFFICE PROGRESS NOTE  [name], MD [place] [address]  DIAGNOSIS: Recurrent non-small cell lung cancer presented with bilateral lung nodules in October 2016 initially diagnosed as a stage IA adenocarcinoma with positive EGFR mutation with deletion in exon 42 in December 2012.  PRIOR THERAPY: Status post bronchoscopy with right VATS and right upper lobectomy with lymph node dissection under the care of Dr. Servando Snare on 05/31/2011.  CURRENT THERAPY: Iressa 250 mg by mouth daily started 04/17/2015, status post 52 months of treatment.  INTERVAL HISTORY: Natalie Carlson 86 y.o. female returns to the clinic today for follow-up visit.  The patient is feeling fine today with no concerning complaints except for blisters in her lips and mouth.  She had similar episodes more than 2 years ago that improved with the Magic mouthwash.  She tried salt water and regular mouthwash with no improvement.  She denied having any current chest pain, shortness of breath, cough or hemoptysis.  She denied having any fever or chills.  She has no nausea, vomiting, diarrhea or constipation.  She has no headache or visual changes.  The patient had repeat CT scan of the chest performed recently and she is here for evaluation and discussion of her risk her results.  MEDICAL HISTORY: Past Medical History:  Diagnosis Date  . Arthritis   . Atrial fibrillation ([place])   . Cataract of both eyes   . Chronic back pain   . Chronic diarrhea   . Colitis   . Hemorrhoids   . History of colon polyps   . History of migraines   . Hyperlipidemia   . Hypothyroidism   . Macular degeneration, dry   . Malignant neoplasm of right upper lobe of lung (HCC)    Stage 1A  EGFR positive  Deletion in exon 19.  ALK-negative  Right Upper Lobe Lung Mass  SUV 2.9 on PET Status post VATS and right upper lobe lobectomy 05/31/2011  . Mucus in stool   .  Nephrolithiasis    1994 - passed on its on    ALLERGIES:  is allergic to doxycycline; entocort ec [budesonide]; neurontin [gabapentin]; vicodin [hydrocodone-acetaminophen]; adhesive [tape]; amoxicillin; chocolate; codeine; levaquin [levofloxacin in d5w]; morphine and related; naprosyn [naproxen]; penicillins; prednisone; tramadol; and uncoded nonscreenable allergen.  MEDICATIONS:  Current Outpatient Medications  Medication Sig Dispense Refill  . Calcium Carbonate (CALCIUM 600 PO) Take 1 tablet by mouth 2 (two) times daily.    Marland Kitchen CARTIA XT 180 MG 24 hr capsule TAKE ONE CAPSULE BY MOUTH EVERY DAY 90 capsule 1  . diclofenac sodium (VOLTAREN) 1 % GEL Apply 1 application topically 2 (two) times daily as needed (for neck pain).     . furosemide (LASIX) 20 MG tablet TAKE 1 TABLET BY MOUTH EVERY DAY AS NEEDED FOR FLUID 90 tablet 1  . gefitinib (IRESSA) 250 MG tablet Take 1 tablet (250 mg total) by mouth daily. 30 tablet 5  . HYDROmorphone (DILAUDID) 4 MG tablet Take 1 tablet (4 mg total) by mouth every 6 (six) hours as needed for severe pain. (Patient not taking: Reported on 07/14/2019) 20 tablet 0  . loperamide (IMODIUM A-D) 2 MG tablet Take 1 mg by mouth daily as needed for diarrhea or loose stools.    . LUTEIN PO Take 1 capsule by mouth daily. Reported on 10/27/2015    . magnesium 30 MG tablet Take 30 mg by  mouth at bedtime.     . mesalamine (APRISO) 0.375 g 24 hr capsule TAKE 2 CAPSULE BY MOUTH TWICE DAILY 120 capsule 11  . Multiple Vitamins-Minerals (AIRBORNE) CHEW Chew 1 tablet by mouth daily.    . Multiple Vitamins-Minerals (HAIR/SKIN/NAILS) TABS Take 1 tablet by mouth daily.    . Multiple Vitamins-Minerals (PRESERVISION AREDS 2 PO) Take 1 tablet by mouth 2 (two) times daily with a meal.    . NON FORMULARY Eye In jection every 8-10weeks.    . Omega-3 Fatty Acids (FISH OIL) 1200 MG CAPS Take 1,200 mg by mouth daily.    . ondansetron (ZOFRAN ODT) 4 MG disintegrating tablet Take 1 tablet (4 mg total)  by mouth every 8 (eight) hours as needed. (Patient not taking: Reported on 07/14/2019) 10 tablet 1  . potassium chloride (KLOR-CON) 10 MEQ tablet TAKE 1 TABLET BY MOUTH EVERY DAY 90 tablet 2  . Simethicone (PHAZYME) 180 MG CAPS Take 1 capsule (180 mg total) by mouth 3 (three) times daily as needed.  0  . warfarin (COUMADIN) 5 MG tablet TAKE 1 TABLET BY MOUTH DAILY EXCEPT TAKE ONE AND 1/2 TABLETS ON TUESDAYS OR AS DIRECTED 40 tablet 3   No current facility-administered medications for this visit.    SURGICAL HISTORY:  Past Surgical History:  Procedure Laterality Date  . Bladder tack  1996  . CARDIOVERSION  11/02/09   x 2  . CATARACT EXTRACTION  2010/2011  . COLONOSCOPY  3 YRS AGO  . COLONOSCOPY  03/06/2011   Procedure: COLONOSCOPY;  Surgeon: Rogene Houston, MD;  [facility];  Service: Endoscopy;  Laterality: N/A;  7:30 am  . ESOPHAGOGASTRODUODENOSCOPY    . FLEXIBLE SIGMOIDOSCOPY N/A 10/04/2012   Procedure: FLEXIBLE SIGMOIDOSCOPY;  Surgeon: Rogene Houston, MD;  [facility]: [facility];  Service: Endoscopy;  Laterality: N/A;  11:00-moved to 1015 Ann to notify pt  . THYROID SURGERY  1961  . TONSILLECTOMY    . VIDEO BRONCHOSCOPY  05/31/2011   Procedure: VIDEO BRONCHOSCOPY;  Surgeon: Grace Isaac, MD;  Location: [facility] OR;  Service: Thoracic;  Laterality: N/A;    REVIEW OF SYSTEMS:  Constitutional: negative Eyes: negative Ears, nose, mouth, throat, and face: positive for sore mouth Respiratory: negative Cardiovascular: negative Gastrointestinal: negative Genitourinary:negative Integument/breast: negative Hematologic/lymphatic: negative Musculoskeletal:negative Neurological: negative Behavioral/Psych: negative Endocrine: negative Allergic/Immunologic: negative   PHYSICAL EXAMINATION: General appearance: alert, cooperative and no distress Head: Normocephalic, without obvious abnormality, atraumatic Neck: no adenopathy, no JVD, supple, symmetrical, trachea midline and  thyroid not enlarged, symmetric, no tenderness/mass/nodules Lymph nodes: Cervical, supraclavicular, and axillary nodes normal. Resp: clear to auscultation bilaterally Back: symmetric, no curvature. ROM normal. No CVA tenderness. Cardio: regular rate and rhythm, S1, S2 normal, no murmur, click, rub or gallop GI: soft, non-tender; bowel sounds normal; no masses,  no organomegaly Extremities: extremities normal, atraumatic, no cyanosis or edema Neurologic: Alert and oriented X 3, normal strength and tone. Normal symmetric reflexes. Normal coordination and gait  ECOG PERFORMANCE STATUS: 0 - Asymptomatic  Blood pressure 136/73, pulse 92, temperature 98.7 F (37.1 C), temperature source Temporal, resp. rate 18, height _0  (1.6 m), weight 107 lb 9.6 oz (48.8 kg), SpO2 98 %.  LABORATORY DATA: Lab Results  Component Value Date   WBC 3.8 (L) 09/10/2019   HGB 14.2 09/10/2019   HCT 44.8 09/10/2019   MCV 94.5 09/10/2019   PLT 222 09/10/2019      Chemistry      Component Value Date/Time  NA 140 09/10/2019 1015   NA 141 05/14/2017 1304   K 4.5 09/10/2019 1015   K 4.3 05/14/2017 1304   CL 101 09/10/2019 1015   CO2 28 09/10/2019 1015   CO2 30 (H) 05/14/2017 1304   BUN 17 09/10/2019 1015   BUN 19.0 05/14/2017 1304   CREATININE 0.70 09/10/2019 1015   CREATININE 0.81 07/14/2019 1205   CREATININE 0.8 05/14/2017 1304      Component Value Date/Time   CALCIUM 9.5 09/10/2019 1015   CALCIUM 9.1 05/14/2017 1304   ALKPHOS 81 09/10/2019 1015   ALKPHOS 86 05/14/2017 1304   AST 33 09/10/2019 1015   AST 29 07/14/2019 1205   AST 25 05/14/2017 1304   ALT 22 09/10/2019 1015   ALT 15 07/14/2019 1205   ALT 15 05/14/2017 1304   BILITOT 0.4 09/10/2019 1015   BILITOT 0.7 07/14/2019 1205   BILITOT 0.57 05/14/2017 1304       RADIOGRAPHIC STUDIES: CT Chest W Contrast  Result Date: 09/12/2019 CLINICAL DATA:  Non-small-cell lung cancer. Restaging. EXAM: CT CHEST WITH CONTRAST TECHNIQUE:  Multidetector CT imaging of the chest was performed during intravenous contrast administration. CONTRAST:  23m OMNIPAQUE IOHEXOL 300 MG/ML  SOLN COMPARISON:  03/07/2019 FINDINGS: Cardiovascular: Heart is upper normal to mildly enlarged. No substantial pericardial effusion. Ascending thoracic aorta measures 4 cm diameter. Mediastinum/Nodes: Right thyroid nodule measures 2.2 cm. No mediastinal lymphadenopathy. There is no hilar lymphadenopathy. The esophagus has normal imaging features. There is no axillary lymphadenopathy. Lungs/Pleura: 7 mm left apical nodule is stable. Nodular thickening along the right major fissure towards the apex is similar to prior. Surgical changes again noted in the right hilum. No new suspicious pulmonary nodule or mass. Chronic subpleural reticulation/interstitial thickening is similar to prior. No focal airspace consolidation. No pleural effusion. Upper Abdomen: Tiny nonobstructing stones noted left kidney. Musculoskeletal: No worrisome lytic or sclerotic osseous abnormality. IMPRESSION: 1. Stable exam. No new or progressive findings to suggest recurrent or metastatic disease. 2. Stable 7 mm left apical pulmonary nodule. 3. Subpleural reticulation suggests underlying component of fibrotic lung disease. 4. Stable 2 cm right thyroid nodule. This has been documented on prior studies. No follow-up recommended. (Ref: J Am Coll Radiol. 2015 Feb;12(2): 143-50). 5. Aortic Atherosclerosis (ICD10-I70.0). Electronically Signed   By: EMisty StanleyM.D.   On: 09/12/2019 13:04    ASSESSMENT AND PLAN:  This is a very pleasant 86years old white female with recurrent non-small cell lung cancer, adenocarcinoma with positive EGFR mutation. The patient has been on treatment with Iressa 250 mg p.o. daily for the last 52 months. The patient continues to tolerate this treatment well with no concerning adverse effects except for the mouth sores. She had repeat CT scan of the chest performed recently.  I  personally and independently reviewed the scans and discussed the results with the patient today. Her scan showed no concerning findings for disease progression. I recommended for her to continue her current treatment with Iressa with the same dose. For the mouth sores, we will start the patient on Magic mouthwash. The patient will come back for follow-up visit in 2 months for evaluation and repeat blood work. She was advised to call immediately if she has any concerning symptoms in the interval. The patient voices understanding of current disease status and treatment options and is in agreement with the current care plan. All questions were answered. The patient knows to call the clinic with any problems, questions or concerns. We can certainly see  the patient much sooner if necessary.  Disclaimer: This note was dictated with voice recognition software. Similar sounding words can inadvertently be transcribed and may not be corrected upon review.

## 2019-09-22 NOTE — Telephone Encounter (Signed)
Scheduled per 5/10 los. Gave pt calender.

## 2019-09-24 ENCOUNTER — Telehealth: Payer: Self-pay | Admitting: Medical Oncology

## 2019-09-24 NOTE — Telephone Encounter (Signed)
Faxed notes per [employer] request for pt assistance application completion for iressa.

## 2019-10-16 ENCOUNTER — Other Ambulatory Visit: Payer: Self-pay

## 2019-10-16 ENCOUNTER — Ambulatory Visit (INDEPENDENT_AMBULATORY_CARE_PROVIDER_SITE_OTHER): Payer: Medicare Other | Admitting: *Deleted

## 2019-10-16 DIAGNOSIS — I4891 Unspecified atrial fibrillation: Secondary | ICD-10-CM | POA: Diagnosis not present

## 2019-10-16 DIAGNOSIS — Z5181 Encounter for therapeutic drug level monitoring: Secondary | ICD-10-CM | POA: Diagnosis not present

## 2019-10-16 LAB — POCT INR: INR: 2.6 (ref 2.0–3.0)

## 2019-10-16 NOTE — Patient Instructions (Signed)
Continue warfarin 1 tablet daily except 1 1/2 tablets on Tuesdays and Fridays Recheck in 6 weeks

## 2019-10-26 ENCOUNTER — Other Ambulatory Visit: Payer: Self-pay | Admitting: Cardiovascular Disease

## 2019-10-26 ENCOUNTER — Other Ambulatory Visit: Payer: Self-pay | Admitting: Cardiology

## 2019-10-28 DIAGNOSIS — L03116 Cellulitis of left lower limb: Secondary | ICD-10-CM | POA: Diagnosis not present

## 2019-10-28 DIAGNOSIS — Z681 Body mass index (BMI) 19 or less, adult: Secondary | ICD-10-CM | POA: Diagnosis not present

## 2019-10-28 DIAGNOSIS — S70362A Insect bite (nonvenomous), left thigh, initial encounter: Secondary | ICD-10-CM | POA: Diagnosis not present

## 2019-11-24 ENCOUNTER — Other Ambulatory Visit: Payer: Self-pay

## 2019-11-24 ENCOUNTER — Inpatient Hospital Stay: Payer: Medicare Other | Attending: Internal Medicine | Admitting: Internal Medicine

## 2019-11-24 ENCOUNTER — Inpatient Hospital Stay: Payer: Medicare Other

## 2019-11-24 ENCOUNTER — Encounter: Payer: Self-pay | Admitting: Internal Medicine

## 2019-11-24 VITALS — BP 140/68 | HR 83 | Temp 97.9°F | Resp 18 | Ht 63.0 in | Wt 104.4 lb

## 2019-11-24 DIAGNOSIS — Z880 Allergy status to penicillin: Secondary | ICD-10-CM | POA: Insufficient documentation

## 2019-11-24 DIAGNOSIS — Z881 Allergy status to other antibiotic agents status: Secondary | ICD-10-CM | POA: Insufficient documentation

## 2019-11-24 DIAGNOSIS — C3411 Malignant neoplasm of upper lobe, right bronchus or lung: Secondary | ICD-10-CM | POA: Diagnosis not present

## 2019-11-24 DIAGNOSIS — Z885 Allergy status to narcotic agent status: Secondary | ICD-10-CM | POA: Insufficient documentation

## 2019-11-24 DIAGNOSIS — Z85118 Personal history of other malignant neoplasm of bronchus and lung: Secondary | ICD-10-CM | POA: Diagnosis not present

## 2019-11-24 DIAGNOSIS — Z7901 Long term (current) use of anticoagulants: Secondary | ICD-10-CM | POA: Diagnosis not present

## 2019-11-24 DIAGNOSIS — Z8719 Personal history of other diseases of the digestive system: Secondary | ICD-10-CM | POA: Insufficient documentation

## 2019-11-24 DIAGNOSIS — I4891 Unspecified atrial fibrillation: Secondary | ICD-10-CM | POA: Diagnosis not present

## 2019-11-24 DIAGNOSIS — Z888 Allergy status to other drugs, medicaments and biological substances status: Secondary | ICD-10-CM | POA: Diagnosis not present

## 2019-11-24 DIAGNOSIS — Z79899 Other long term (current) drug therapy: Secondary | ICD-10-CM | POA: Diagnosis not present

## 2019-11-24 DIAGNOSIS — Z886 Allergy status to analgesic agent status: Secondary | ICD-10-CM | POA: Insufficient documentation

## 2019-11-24 LAB — CBC WITH DIFFERENTIAL (CANCER CENTER ONLY)
Abs Immature Granulocytes: 0.01 10*3/uL (ref 0.00–0.07)
Basophils Absolute: 0.1 10*3/uL (ref 0.0–0.1)
Basophils Relative: 1 %
Eosinophils Absolute: 0.2 10*3/uL (ref 0.0–0.5)
Eosinophils Relative: 5 %
HCT: 43.9 % (ref 36.0–46.0)
Hemoglobin: 14.4 g/dL (ref 12.0–15.0)
Immature Granulocytes: 0 %
Lymphocytes Relative: 25 %
Lymphs Abs: 1.1 10*3/uL (ref 0.7–4.0)
MCH: 30.7 pg (ref 26.0–34.0)
MCHC: 32.8 g/dL (ref 30.0–36.0)
MCV: 93.6 fL (ref 80.0–100.0)
Monocytes Absolute: 0.7 10*3/uL (ref 0.1–1.0)
Monocytes Relative: 16 %
Neutro Abs: 2.3 10*3/uL (ref 1.7–7.7)
Neutrophils Relative %: 53 %
Platelet Count: 230 10*3/uL (ref 150–400)
RBC: 4.69 MIL/uL (ref 3.87–5.11)
RDW: 13.5 % (ref 11.5–15.5)
WBC Count: 4.4 10*3/uL (ref 4.0–10.5)
nRBC: 0 % (ref 0.0–0.2)

## 2019-11-24 LAB — CMP (CANCER CENTER ONLY)
ALT: 19 U/L (ref 0–44)
AST: 31 U/L (ref 15–41)
Albumin: 3.7 g/dL (ref 3.5–5.0)
Alkaline Phosphatase: 86 U/L (ref 38–126)
Anion gap: 8 (ref 5–15)
BUN: 19 mg/dL (ref 8–23)
CO2: 29 mmol/L (ref 22–32)
Calcium: 9.7 mg/dL (ref 8.9–10.3)
Chloride: 104 mmol/L (ref 98–111)
Creatinine: 0.96 mg/dL (ref 0.44–1.00)
GFR, Est AFR Am: >60 mL/min (ref >60)
GFR, Estimated: 53 mL/min — ABNORMAL LOW (ref >60)
Glucose, Bld: 84 mg/dL (ref 70–99)
Potassium: 4.1 mmol/L (ref 3.5–5.1)
Sodium: 141 mmol/L (ref 135–145)
Total Bilirubin: 0.6 mg/dL (ref 0.3–1.2)
Total Protein: 7.2 g/dL (ref 6.5–8.1)

## 2019-11-24 NOTE — Progress Notes (Signed)
[place] Telephone:([phone]   Fax:(336) [phone]  OFFICE PROGRESS NOTE  [name], MD [place] [address]  DIAGNOSIS: Recurrent non-small cell lung cancer presented with bilateral lung nodules in October 2016 initially diagnosed as a stage IA adenocarcinoma with positive EGFR mutation with deletion in exon 20 in December 2012.  PRIOR THERAPY: Status post bronchoscopy with right VATS and right upper lobectomy with lymph node dissection under the care of Dr. Servando Snare on 05/31/2011.  CURRENT THERAPY: Iressa 250 mg by mouth daily started 04/17/2015, status post 54 months of treatment.  INTERVAL HISTORY: Natalie Carlson 86 y.o. female returns to the clinic today for follow-up visit.  The patient is feeling fine today with no concerning complaints.  She had a tick bite 6 weeks ago and treated with doxycycline by her primary care physician.  She denied having any current chest pain, shortness of breath, cough or hemoptysis.  She denied having any fever or chills.  She has no nausea, vomiting, diarrhea or constipation.  She has no skin rash or itching.  She continues to tolerate her treatment with Iressa fairly well.  The patient is here today for evaluation and repeat blood work.  MEDICAL HISTORY: Past Medical History:  Diagnosis Date  . Arthritis   . Atrial fibrillation ([place])   . Cataract of both eyes   . Chronic back pain   . Chronic diarrhea   . Colitis   . Hemorrhoids   . History of colon polyps   . History of migraines   . Hyperlipidemia   . Hypothyroidism   . Macular degeneration, dry   . Malignant neoplasm of right upper lobe of lung (HCC)    Stage 1A  EGFR positive  Deletion in exon 19.  ALK-negative  Right Upper Lobe Lung Mass  SUV 2.9 on PET Status post VATS and right upper lobe lobectomy 05/31/2011  . Mucus in stool   . Nephrolithiasis    1994 - passed on its on    ALLERGIES:  is allergic to doxycycline, entocort ec [budesonide],  neurontin [gabapentin], vicodin [hydrocodone-acetaminophen], adhesive [tape], amoxicillin, chocolate, codeine, levaquin [levofloxacin in d5w], morphine and related, naprosyn [naproxen], penicillins, prednisone, tramadol, and uncoded nonscreenable allergen.  MEDICATIONS:  Current Outpatient Medications  Medication Sig Dispense Refill  . Calcium Carbonate (CALCIUM 600 PO) Take 1 tablet by mouth 2 (two) times daily.    . diclofenac sodium (VOLTAREN) 1 % GEL Apply 1 application topically 2 (two) times daily as needed (for neck pain).     Marland Kitchen diltiazem (CARDIZEM CD) 180 MG 24 hr capsule TAKE ONE CAPSULE BY MOUTH EVERY DAY 90 capsule 2  . furosemide (LASIX) 20 MG tablet TAKE 1 TABLET BY MOUTH EVERY DAY AS NEEDED FOR FLUID 90 tablet 1  . gefitinib (IRESSA) 250 MG tablet Take 1 tablet (250 mg total) by mouth daily. 30 tablet 5  . HYDROmorphone (DILAUDID) 4 MG tablet Take 1 tablet (4 mg total) by mouth every 6 (six) hours as needed for severe pain. (Patient not taking: Reported on 07/14/2019) 20 tablet 0  . loperamide (IMODIUM A-D) 2 MG tablet Take 1 mg by mouth daily as needed for diarrhea or loose stools.    . LUTEIN PO Take 1 capsule by mouth daily. Reported on 10/27/2015    . magic mouthwash SOLN Take 5 mLs by mouth 4 (four) times daily as needed for mouth pain. 240 mL 0  . magnesium 30 MG tablet  Take 30 mg by mouth at bedtime.     . mesalamine (APRISO) 0.375 g 24 hr capsule TAKE 2 CAPSULE BY MOUTH TWICE DAILY 120 capsule 11  . Multiple Vitamins-Minerals (AIRBORNE) CHEW Chew 1 tablet by mouth daily.    . Multiple Vitamins-Minerals (HAIR/SKIN/NAILS) TABS Take 1 tablet by mouth daily.    . Multiple Vitamins-Minerals (PRESERVISION AREDS 2 PO) Take 1 tablet by mouth 2 (two) times daily with a meal.    . NON FORMULARY Eye In jection every 8-10weeks.    . Omega-3 Fatty Acids (FISH OIL) 1200 MG CAPS Take 1,200 mg by mouth daily.    . ondansetron (ZOFRAN ODT) 4 MG disintegrating tablet Take 1 tablet (4 mg total)  by mouth every 8 (eight) hours as needed. (Patient not taking: Reported on 07/14/2019) 10 tablet 1  . potassium chloride (KLOR-CON) 10 MEQ tablet TAKE 1 TABLET BY MOUTH EVERY DAY 90 tablet 2  . Simethicone (PHAZYME) 180 MG CAPS Take 1 capsule (180 mg total) by mouth 3 (three) times daily as needed.  0  . warfarin (COUMADIN) 5 MG tablet TAKE 1 TABLET BY MOUTH DAILY EXCEPT TAKE ONE AND 1/2 TABLETS ON TUESDAYS AND FRIDAYS 40 tablet 3   No current facility-administered medications for this visit.    SURGICAL HISTORY:  Past Surgical History:  Procedure Laterality Date  . Bladder tack  1996  . CARDIOVERSION  11/02/09   x 2  . CATARACT EXTRACTION  2010/2011  . COLONOSCOPY  3 YRS AGO  . COLONOSCOPY  03/06/2011   Procedure: COLONOSCOPY;  Surgeon: Rogene Houston, MD;  [facility];  Service: Endoscopy;  Laterality: N/A;  7:30 am  . ESOPHAGOGASTRODUODENOSCOPY    . FLEXIBLE SIGMOIDOSCOPY N/A 10/04/2012   Procedure: FLEXIBLE SIGMOIDOSCOPY;  Surgeon: Rogene Houston, MD;  [facility]: [facility];  Service: Endoscopy;  Laterality: N/A;  11:00-moved to 1015 Ann to notify pt  . THYROID SURGERY  1961  . TONSILLECTOMY    . VIDEO BRONCHOSCOPY  05/31/2011   Procedure: VIDEO BRONCHOSCOPY;  Surgeon: Grace Isaac, MD;  Location[facility] OR;  Service: Thoracic;  Laterality: N/A;    REVIEW OF SYSTEMS:  A comprehensive review of systems was negative.   PHYSICAL EXAMINATION: General appearance: alert, cooperative and no distress Head: Normocephalic, without obvious abnormality, atraumatic Neck: no adenopathy, no JVD, supple, symmetrical, trachea midline and thyroid not enlarged, symmetric, no tenderness/mass/nodules Lymph nodes: Cervical, supraclavicular, and axillary nodes normal. Resp: clear to auscultation bilaterally Back: symmetric, no curvature. ROM normal. No CVA tenderness. Cardio: regular rate and rhythm, S1, S2 normal, no murmur, click, rub or gallop GI: soft, non-tender; bowel sounds  normal; no masses,  no organomegaly Extremities: extremities normal, atraumatic, no cyanosis or edema  ECOG PERFORMANCE STATUS: 0 - Asymptomatic  Blood pressure 140/68, pulse 83, temperature 97.9 F (36.6 C), temperature source Temporal, resp. rate 18, height 5' 3" (1.6 m), weight 104 lb 6.4 oz (47.4 kg), SpO2 99 %.  LABORATORY DATA: Lab Results  Component Value Date   WBC 4.4 11/24/2019   HGB 14.4 11/24/2019   HCT 43.9 11/24/2019   MCV 93.6 11/24/2019   PLT 230 11/24/2019      Chemistry      Component Value Date/Time   NA 140 09/10/2019 1015   NA 141 05/14/2017 1304   K 4.5 09/10/2019 1015   K 4.3 05/14/2017 1304   CL 101 09/10/2019 1015   CO2 28 09/10/2019 1015   CO2 30 (H) 05/14/2017 1304  BUN 17 09/10/2019 1015   BUN 19.0 05/14/2017 1304   CREATININE 0.70 09/10/2019 1015   CREATININE 0.81 07/14/2019 1205   CREATININE 0.8 05/14/2017 1304      Component Value Date/Time   CALCIUM 9.5 09/10/2019 1015   CALCIUM 9.1 05/14/2017 1304   ALKPHOS 81 09/10/2019 1015   ALKPHOS 86 05/14/2017 1304   AST 33 09/10/2019 1015   AST 29 07/14/2019 1205   AST 25 05/14/2017 1304   ALT 22 09/10/2019 1015   ALT 15 07/14/2019 1205   ALT 15 05/14/2017 1304   BILITOT 0.4 09/10/2019 1015   BILITOT 0.7 07/14/2019 1205   BILITOT 0.57 05/14/2017 1304       RADIOGRAPHIC STUDIES: No results found.  ASSESSMENT AND PLAN:  This is a very pleasant 86 years old white female with recurrent non-small cell lung cancer, adenocarcinoma with positive EGFR mutation. The patient has been on treatment with Iressa 250 mg p.o. daily for the last 54 months. The patient continues to tolerate her treatment well with no concerning adverse effects. I recommended for her to continue her treatment with Iressa with the same dose. She will come back for follow-up visit in 2 months for evaluation with repeat blood work. I will schedule her to have imaging studies every 6 months since she has been doing very  well on her current treatment with Iressa. The patient was advised to call immediately if she has any concerning symptoms in the interval. The patient voices understanding of current disease status and treatment options and is in agreement with the current care plan. All questions were answered. The patient knows to call the clinic with any problems, questions or concerns. We can certainly see the patient much sooner if necessary.  Disclaimer: This note was dictated with voice recognition software. Similar sounding words can inadvertently be transcribed and may not be corrected upon review.        

## 2019-11-25 ENCOUNTER — Telehealth: Payer: Self-pay | Admitting: Internal Medicine

## 2019-11-25 NOTE — Telephone Encounter (Signed)
Scheduled appt per 7/12 los - unable to reach pt . Left message with appt date and time

## 2019-11-27 ENCOUNTER — Telehealth: Payer: Self-pay | Admitting: Internal Medicine

## 2019-11-27 ENCOUNTER — Ambulatory Visit (INDEPENDENT_AMBULATORY_CARE_PROVIDER_SITE_OTHER): Payer: Medicare Other | Admitting: *Deleted

## 2019-11-27 DIAGNOSIS — Z5181 Encounter for therapeutic drug level monitoring: Secondary | ICD-10-CM | POA: Diagnosis not present

## 2019-11-27 DIAGNOSIS — I4891 Unspecified atrial fibrillation: Secondary | ICD-10-CM | POA: Diagnosis not present

## 2019-11-27 LAB — POCT INR: INR: 1.7 — AB (ref 2.0–3.0)

## 2019-11-27 NOTE — Telephone Encounter (Signed)
Called pt back per 7/14 sch msg - left message for patient to call back if reschedule is still needed

## 2019-11-27 NOTE — Patient Instructions (Signed)
Take warfarin 2 tablets tonight and tomorrow night then resume 1 tablet daily except 1 1/2 tablets on Tuesdays and Fridays Recheck in 2 weeks

## 2019-12-15 ENCOUNTER — Other Ambulatory Visit: Payer: Self-pay

## 2019-12-15 ENCOUNTER — Ambulatory Visit (INDEPENDENT_AMBULATORY_CARE_PROVIDER_SITE_OTHER): Payer: Medicare Other | Admitting: *Deleted

## 2019-12-15 DIAGNOSIS — Z5181 Encounter for therapeutic drug level monitoring: Secondary | ICD-10-CM

## 2019-12-15 DIAGNOSIS — I4891 Unspecified atrial fibrillation: Secondary | ICD-10-CM | POA: Diagnosis not present

## 2019-12-15 LAB — POCT INR: INR: 2.1 (ref 2.0–3.0)

## 2019-12-15 NOTE — Patient Instructions (Signed)
Increase warfarin to 1 tablet daily except 1 1/2 tablets on Monday, Wednesdays and Fridays Recheck in 3 weeks

## 2020-01-05 ENCOUNTER — Ambulatory Visit (INDEPENDENT_AMBULATORY_CARE_PROVIDER_SITE_OTHER): Payer: Medicare Other | Admitting: *Deleted

## 2020-01-05 ENCOUNTER — Other Ambulatory Visit: Payer: Self-pay

## 2020-01-05 DIAGNOSIS — I4891 Unspecified atrial fibrillation: Secondary | ICD-10-CM | POA: Diagnosis not present

## 2020-01-05 DIAGNOSIS — Z5181 Encounter for therapeutic drug level monitoring: Secondary | ICD-10-CM | POA: Diagnosis not present

## 2020-01-05 LAB — POCT INR: INR: 2.6 (ref 2.0–3.0)

## 2020-01-05 NOTE — Patient Instructions (Signed)
Continue warfarin 1 tablet daily except 1 1/2 tablets on Monday, Wednesdays and Fridays Recheck in 4 weeks

## 2020-01-26 ENCOUNTER — Encounter: Payer: Self-pay | Admitting: Internal Medicine

## 2020-01-26 ENCOUNTER — Other Ambulatory Visit: Payer: Self-pay

## 2020-01-26 ENCOUNTER — Inpatient Hospital Stay: Payer: Medicare Other | Attending: Internal Medicine

## 2020-01-26 ENCOUNTER — Inpatient Hospital Stay (HOSPITAL_BASED_OUTPATIENT_CLINIC_OR_DEPARTMENT_OTHER): Payer: Medicare Other | Admitting: Internal Medicine

## 2020-01-26 VITALS — BP 132/84 | HR 88 | Temp 97.9°F | Resp 18 | Ht 63.0 in | Wt 103.2 lb

## 2020-01-26 DIAGNOSIS — Z79899 Other long term (current) drug therapy: Secondary | ICD-10-CM | POA: Diagnosis not present

## 2020-01-26 DIAGNOSIS — Z8719 Personal history of other diseases of the digestive system: Secondary | ICD-10-CM | POA: Insufficient documentation

## 2020-01-26 DIAGNOSIS — Z881 Allergy status to other antibiotic agents status: Secondary | ICD-10-CM | POA: Insufficient documentation

## 2020-01-26 DIAGNOSIS — C3411 Malignant neoplasm of upper lobe, right bronchus or lung: Secondary | ICD-10-CM | POA: Insufficient documentation

## 2020-01-26 DIAGNOSIS — I4891 Unspecified atrial fibrillation: Secondary | ICD-10-CM | POA: Insufficient documentation

## 2020-01-26 DIAGNOSIS — Z7901 Long term (current) use of anticoagulants: Secondary | ICD-10-CM | POA: Diagnosis not present

## 2020-01-26 DIAGNOSIS — M1990 Unspecified osteoarthritis, unspecified site: Secondary | ICD-10-CM | POA: Diagnosis not present

## 2020-01-26 DIAGNOSIS — Z885 Allergy status to narcotic agent status: Secondary | ICD-10-CM | POA: Diagnosis not present

## 2020-01-26 DIAGNOSIS — Z5181 Encounter for therapeutic drug level monitoring: Secondary | ICD-10-CM | POA: Diagnosis not present

## 2020-01-26 DIAGNOSIS — C3490 Malignant neoplasm of unspecified part of unspecified bronchus or lung: Secondary | ICD-10-CM | POA: Diagnosis not present

## 2020-01-26 DIAGNOSIS — Z886 Allergy status to analgesic agent status: Secondary | ICD-10-CM | POA: Diagnosis not present

## 2020-01-26 DIAGNOSIS — Z880 Allergy status to penicillin: Secondary | ICD-10-CM | POA: Insufficient documentation

## 2020-01-26 DIAGNOSIS — Z888 Allergy status to other drugs, medicaments and biological substances status: Secondary | ICD-10-CM | POA: Diagnosis not present

## 2020-01-26 LAB — CMP (CANCER CENTER ONLY)
ALT: 19 U/L (ref 0–44)
AST: 30 U/L (ref 15–41)
Albumin: 3.6 g/dL (ref 3.5–5.0)
Alkaline Phosphatase: 101 U/L (ref 38–126)
Anion gap: 5 (ref 5–15)
BUN: 16 mg/dL (ref 8–23)
CO2: 31 mmol/L (ref 22–32)
Calcium: 9.3 mg/dL (ref 8.9–10.3)
Chloride: 104 mmol/L (ref 98–111)
Creatinine: 0.92 mg/dL (ref 0.44–1.00)
GFR, Est AFR Am: >60 mL/min (ref >60)
GFR, Estimated: 56 mL/min — ABNORMAL LOW (ref >60)
Glucose, Bld: 96 mg/dL (ref 70–99)
Potassium: 4.3 mmol/L (ref 3.5–5.1)
Sodium: 140 mmol/L (ref 135–145)
Total Bilirubin: 0.6 mg/dL (ref 0.3–1.2)
Total Protein: 7 g/dL (ref 6.5–8.1)

## 2020-01-26 LAB — CBC WITH DIFFERENTIAL (CANCER CENTER ONLY)
Abs Immature Granulocytes: 0.01 10*3/uL (ref 0.00–0.07)
Basophils Absolute: 0 10*3/uL (ref 0.0–0.1)
Basophils Relative: 1 %
Eosinophils Absolute: 0.2 10*3/uL (ref 0.0–0.5)
Eosinophils Relative: 4 %
HCT: 42.4 % (ref 36.0–46.0)
Hemoglobin: 14 g/dL (ref 12.0–15.0)
Immature Granulocytes: 0 %
Lymphocytes Relative: 30 %
Lymphs Abs: 1.3 10*3/uL (ref 0.7–4.0)
MCH: 30.7 pg (ref 26.0–34.0)
MCHC: 33 g/dL (ref 30.0–36.0)
MCV: 93 fL (ref 80.0–100.0)
Monocytes Absolute: 0.6 10*3/uL (ref 0.1–1.0)
Monocytes Relative: 13 %
Neutro Abs: 2.2 10*3/uL (ref 1.7–7.7)
Neutrophils Relative %: 52 %
Platelet Count: 236 10*3/uL (ref 150–400)
RBC: 4.56 MIL/uL (ref 3.87–5.11)
RDW: 14.1 % (ref 11.5–15.5)
WBC Count: 4.2 10*3/uL (ref 4.0–10.5)
nRBC: 0 % (ref 0.0–0.2)

## 2020-01-26 NOTE — Progress Notes (Signed)
[name]   [phone]  OFFICE PROGRESS NOTE  [name], MD [place] [address]  DIAGNOSIS: Recurrent non-small cell lung cancer presented with bilateral lung nodules in October 2016 initially diagnosed as a stage IA adenocarcinoma with positive EGFR mutation with deletion in exon 51 in December 2012.  PRIOR THERAPY: Status post bronchoscopy with right VATS and right upper lobectomy with lymph node dissection under the care of Dr. Servando Snare on 05/31/2011.  CURRENT THERAPY: Iressa 250 mg by mouth daily started 04/17/2015, status post 56 months of treatment.  INTERVAL HISTORY: Natalie Carlson 86 y.o. female returns to the clinic today for follow-up visit.  The patient is feeling fine today with no concerning complaints.  She denied having any current chest pain, shortness of breath, cough or hemoptysis.  She denied having any nausea, vomiting, diarrhea or constipation.  She has no headache or visual changes.  She continues to tolerate her treatment with Iressa fairly well.  The patient is here today for evaluation with repeat blood work.  MEDICAL HISTORY: Past Medical History:  Diagnosis Date  . Arthritis   . Atrial fibrillation ([place])   . Cataract of both eyes   . Chronic back pain   . Chronic diarrhea   . Colitis   . Hemorrhoids   . History of colon polyps   . History of migraines   . Hyperlipidemia   . Hypothyroidism   . Macular degeneration, dry   . Malignant neoplasm of right upper lobe of lung (HCC)    Stage 1A  EGFR positive  Deletion in exon 19.  ALK-negative  Right Upper Lobe Lung Mass  SUV 2.9 on PET Status post VATS and right upper lobe lobectomy 05/31/2011  . Mucus in stool   . Nephrolithiasis    1994 - passed on its on    ALLERGIES:  is allergic to doxycycline, entocort ec [budesonide], neurontin [gabapentin], vicodin [hydrocodone-acetaminophen], adhesive [tape], amoxicillin, chocolate, codeine,  levaquin [levofloxacin in d5w], morphine and related, naprosyn [naproxen], penicillins, prednisone, tramadol, and uncoded nonscreenable allergen.  MEDICATIONS:  Current Outpatient Medications  Medication Sig Dispense Refill  . Calcium Carbonate (CALCIUM 600 PO) Take 1 tablet by mouth 2 (two) times daily.    . diclofenac sodium (VOLTAREN) 1 % GEL Apply 1 application topically 2 (two) times daily as needed (for neck pain).     Marland Kitchen diltiazem (CARDIZEM CD) 180 MG 24 hr capsule TAKE ONE CAPSULE BY MOUTH EVERY DAY 90 capsule 2  . furosemide (LASIX) 20 MG tablet TAKE 1 TABLET BY MOUTH EVERY DAY AS NEEDED FOR FLUID 90 tablet 1  . gefitinib (IRESSA) 250 MG tablet Take 1 tablet (250 mg total) by mouth daily. 30 tablet 5  . loperamide (IMODIUM A-D) 2 MG tablet Take 1 mg by mouth daily as needed for diarrhea or loose stools.    . LUTEIN PO Take 1 capsule by mouth daily. Reported on 10/27/2015    . magic mouthwash SOLN Take 5 mLs by mouth 4 (four) times daily as needed for mouth pain. 240 mL 0  . magnesium 30 MG tablet Take 30 mg by mouth at bedtime.     . mesalamine (APRISO) 0.375 g 24 hr capsule TAKE 2 CAPSULE BY MOUTH TWICE DAILY 120 capsule 11  . Multiple Vitamins-Minerals (AIRBORNE) CHEW Chew 1 tablet by mouth daily.    . Multiple Vitamins-Minerals (HAIR/SKIN/NAILS) TABS Take 1 tablet by mouth daily.    Marland Kitchen  Multiple Vitamins-Minerals (PRESERVISION AREDS 2 PO) Take 1 tablet by mouth 2 (two) times daily with a meal.    . NON FORMULARY Eye In jection every 8-10weeks.    . Omega-3 Fatty Acids (FISH OIL) 1200 MG CAPS Take 1,200 mg by mouth daily.    . potassium chloride (KLOR-CON) 10 MEQ tablet TAKE 1 TABLET BY MOUTH EVERY DAY 90 tablet 2  . Simethicone (PHAZYME) 180 MG CAPS Take 1 capsule (180 mg total) by mouth 3 (three) times daily as needed.  0  . warfarin (COUMADIN) 5 MG tablet TAKE 1 TABLET BY MOUTH DAILY EXCEPT TAKE ONE AND 1/2 TABLETS ON TUESDAYS AND FRIDAYS 40 tablet 3  . HYDROmorphone (DILAUDID) 4 MG  tablet Take 1 tablet (4 mg total) by mouth every 6 (six) hours as needed for severe pain. (Patient not taking: Reported on 07/14/2019) 20 tablet 0  . ondansetron (ZOFRAN ODT) 4 MG disintegrating tablet Take 1 tablet (4 mg total) by mouth every 8 (eight) hours as needed. (Patient not taking: Reported on 07/14/2019) 10 tablet 1   No current facility-administered medications for this visit.    SURGICAL HISTORY:  Past Surgical History:  Procedure Laterality Date  . Bladder tack  1996  . CARDIOVERSION  11/02/09   x 2  . CATARACT EXTRACTION  2010/2011  . COLONOSCOPY  3 YRS AGO  . COLONOSCOPY  03/06/2011   Procedure: COLONOSCOPY;  Surgeon: Rogene Houston, MD;  Location: [facility];  Service: Endoscopy;  Laterality: N/A;  7:30 am  . ESOPHAGOGASTRODUODENOSCOPY    . FLEXIBLE SIGMOIDOSCOPY N/A 10/04/2012   Procedure: FLEXIBLE SIGMOIDOSCOPY;  Surgeon: Rogene Houston, MD;  Location: [facility];  Service: Endoscopy;  Laterality: N/A;  11:00-moved to 1015 Ann to notify pt  . THYROID SURGERY  1961  . TONSILLECTOMY    . VIDEO BRONCHOSCOPY  05/31/2011   Procedure: VIDEO BRONCHOSCOPY;  Surgeon: Grace Isaac, MD;  Location: [facility] OR;  Service: Thoracic;  Laterality: N/A;    REVIEW OF SYSTEMS:  A comprehensive review of systems was negative.   PHYSICAL EXAMINATION: General appearance: alert, cooperative and no distress Head: Normocephalic, without obvious abnormality, atraumatic Neck: no adenopathy, no JVD, supple, symmetrical, trachea midline and thyroid not enlarged, symmetric, no tenderness/mass/nodules Lymph nodes: Cervical, supraclavicular, and axillary nodes normal. Resp: clear to auscultation bilaterally Back: symmetric, no curvature. ROM normal. No CVA tenderness. Cardio: regular rate and rhythm, S1, S2 normal, no murmur, click, rub or gallop GI: soft, non-tender; bowel sounds normal; no masses,  no organomegaly Extremities: extremities normal, atraumatic, no cyanosis or edema  ECOG  PERFORMANCE STATUS: 0 - Asymptomatic  Blood pressure 132/84, pulse 88, temperature 97.9 F (36.6 C), temperature source Tympanic, resp. rate 18, height 5' 3"  (1.6 m), weight 103 lb 3.2 oz (46.8 kg), SpO2 97 %.  LABORATORY DATA: Lab Results  Component Value Date   WBC 4.2 01/26/2020   HGB 14.0 01/26/2020   HCT 42.4 01/26/2020   MCV 93.0 01/26/2020   PLT 236 01/26/2020      Chemistry      Component Value Date/Time   NA 141 11/24/2019 1332   NA 141 05/14/2017 1304   K 4.1 11/24/2019 1332   K 4.3 05/14/2017 1304   CL 104 11/24/2019 1332   CO2 29 11/24/2019 1332   CO2 30 (H) 05/14/2017 1304   BUN 19 11/24/2019 1332   BUN 19.0 05/14/2017 1304   CREATININE 0.96 11/24/2019 1332   CREATININE 0.8 05/14/2017 1304  Component Value Date/Time   CALCIUM 9.7 11/24/2019 1332   CALCIUM 9.1 05/14/2017 1304   ALKPHOS 86 11/24/2019 1332   ALKPHOS 86 05/14/2017 1304   AST 31 11/24/2019 1332   AST 25 05/14/2017 1304   ALT 19 11/24/2019 1332   ALT 15 05/14/2017 1304   BILITOT 0.6 11/24/2019 1332   BILITOT 0.57 05/14/2017 1304       RADIOGRAPHIC STUDIES: No results found.  ASSESSMENT AND PLAN:  This is a very pleasant 86 years old white female with recurrent non-small cell lung cancer, adenocarcinoma with positive EGFR mutation. The patient has been on treatment with Iressa 250 mg p.o. daily for the last 56 months. The patient continues to tolerate her treatment with Tagrisso fairly well with no concerning complaints. I recommended for her to continue her current treatment with the same dose. I will see her back for follow-up visit in 2 months for evaluation with repeat CT scan of the chest for restaging of her disease. The patient was advised to call immediately if she has any concerning symptoms in the interval. The patient voices understanding of current disease status and treatment options and is in agreement with the current care plan. All questions were answered. The patient  knows to call the clinic with any problems, questions or concerns. We can certainly see the patient much sooner if necessary.  Disclaimer: This note was dictated with voice recognition software. Similar sounding words can inadvertently be transcribed and may not be corrected upon review.

## 2020-01-28 ENCOUNTER — Telehealth: Payer: Self-pay | Admitting: Internal Medicine

## 2020-01-28 NOTE — Telephone Encounter (Signed)
Scheduled per los. Called and spoke with patient confirmed appt

## 2020-02-03 ENCOUNTER — Ambulatory Visit (INDEPENDENT_AMBULATORY_CARE_PROVIDER_SITE_OTHER): Payer: Medicare Other | Admitting: *Deleted

## 2020-02-03 DIAGNOSIS — Z5181 Encounter for therapeutic drug level monitoring: Secondary | ICD-10-CM

## 2020-02-03 DIAGNOSIS — I4891 Unspecified atrial fibrillation: Secondary | ICD-10-CM

## 2020-02-03 LAB — POCT INR: INR: 2.2 (ref 2.0–3.0)

## 2020-02-03 NOTE — Patient Instructions (Signed)
Continue warfarin 1 tablet daily except 1 1/2 tablets on Monday, Wednesdays and Fridays Recheck in 4 weeks

## 2020-02-10 DIAGNOSIS — Z1329 Encounter for screening for other suspected endocrine disorder: Secondary | ICD-10-CM | POA: Diagnosis not present

## 2020-02-10 DIAGNOSIS — K5090 Crohn's disease, unspecified, without complications: Secondary | ICD-10-CM | POA: Diagnosis not present

## 2020-02-10 DIAGNOSIS — I482 Chronic atrial fibrillation, unspecified: Secondary | ICD-10-CM | POA: Diagnosis not present

## 2020-02-10 DIAGNOSIS — Z13220 Encounter for screening for lipoid disorders: Secondary | ICD-10-CM | POA: Diagnosis not present

## 2020-02-10 DIAGNOSIS — E755 Other lipid storage disorders: Secondary | ICD-10-CM | POA: Diagnosis not present

## 2020-02-12 DIAGNOSIS — Z23 Encounter for immunization: Secondary | ICD-10-CM | POA: Diagnosis not present

## 2020-02-12 DIAGNOSIS — K5100 Ulcerative (chronic) pancolitis without complications: Secondary | ICD-10-CM | POA: Diagnosis not present

## 2020-02-12 DIAGNOSIS — C3411 Malignant neoplasm of upper lobe, right bronchus or lung: Secondary | ICD-10-CM | POA: Diagnosis not present

## 2020-02-12 DIAGNOSIS — H35342 Macular cyst, hole, or pseudohole, left eye: Secondary | ICD-10-CM | POA: Diagnosis not present

## 2020-02-12 DIAGNOSIS — Z7189 Other specified counseling: Secondary | ICD-10-CM | POA: Diagnosis not present

## 2020-02-12 DIAGNOSIS — M810 Age-related osteoporosis without current pathological fracture: Secondary | ICD-10-CM | POA: Diagnosis not present

## 2020-02-12 DIAGNOSIS — I5032 Chronic diastolic (congestive) heart failure: Secondary | ICD-10-CM | POA: Diagnosis not present

## 2020-02-23 DIAGNOSIS — H353231 Exudative age-related macular degeneration, bilateral, with active choroidal neovascularization: Secondary | ICD-10-CM | POA: Diagnosis not present

## 2020-02-23 DIAGNOSIS — H43813 Vitreous degeneration, bilateral: Secondary | ICD-10-CM | POA: Diagnosis not present

## 2020-02-23 DIAGNOSIS — H43391 Other vitreous opacities, right eye: Secondary | ICD-10-CM | POA: Diagnosis not present

## 2020-02-23 DIAGNOSIS — H35371 Puckering of macula, right eye: Secondary | ICD-10-CM | POA: Diagnosis not present

## 2020-02-24 ENCOUNTER — Ambulatory Visit (INDEPENDENT_AMBULATORY_CARE_PROVIDER_SITE_OTHER): Payer: Medicare Other | Admitting: Internal Medicine

## 2020-02-24 ENCOUNTER — Other Ambulatory Visit: Payer: Medicare Other

## 2020-02-24 ENCOUNTER — Other Ambulatory Visit: Payer: Self-pay

## 2020-02-24 ENCOUNTER — Ambulatory Visit: Payer: Medicare Other | Admitting: Internal Medicine

## 2020-02-24 ENCOUNTER — Encounter (INDEPENDENT_AMBULATORY_CARE_PROVIDER_SITE_OTHER): Payer: Self-pay | Admitting: Internal Medicine

## 2020-02-24 VITALS — BP 135/79 | HR 65 | Temp 98.4°F | Ht 63.0 in | Wt 101.2 lb

## 2020-02-24 DIAGNOSIS — K5010 Crohn's disease of large intestine without complications: Secondary | ICD-10-CM | POA: Diagnosis not present

## 2020-02-24 MED ORDER — MESALAMINE ER 0.375 G PO CP24
ORAL_CAPSULE | ORAL | 11 refills | Status: DC
Start: 2020-02-24 — End: 2021-02-24

## 2020-02-24 NOTE — Progress Notes (Signed)
Presenting complaint;  Follow-up for Crohn's colitis.  Database and subjective:  Patient is 86 year old Caucasian female who was diagnosed with Crohn's colitis back in October 2012 who has been maintained on mesalamine who is here for yearly visit. She says she is doing well.  She states she has very good appetite.  She eats well.  She eats 3 meals and 2 snacks every day.  She says she stays busy.  She does housework yard work and she also works with [employer] on [employer].  She denies heartburn nausea vomiting dysphagia abdominal pain melena or rectal bleeding.  Her bowels move once a day and occasionally she may go twice.  She is not having any side effects with mesalamine.  She says she takes Lasix no more than 2-3 times a week.  She is taking Imodium 1 mg 3-4 times a week.  She has lost 6 pounds since her last visit but she says she is not worried. She says she has macular degeneration.  She says she has had 11 injections on the right side and 13 on the left. She says as for his lung carcinoma is concerned she she is doing well with result.  She is under care of Dr. Curt Bears.  Current Medications: Outpatient Encounter Medications as of 02/24/2020  Medication Sig  . acetaminophen (TYLENOL) 500 MG tablet Take 500 mg by mouth. Patient takes as needed , which is seldom.  . Calcium Carbonate (CALCIUM 600 PO) Take 1 tablet by mouth 2 (two) times daily.  . diclofenac sodium (VOLTAREN) 1 % GEL Apply 1 application topically 2 (two) times daily as needed (for neck pain).   Marland Kitchen diltiazem (CARDIZEM CD) 180 MG 24 hr capsule TAKE ONE CAPSULE BY MOUTH EVERY DAY  . furosemide (LASIX) 20 MG tablet TAKE 1 TABLET BY MOUTH EVERY DAY AS NEEDED FOR FLUID  . gefitinib (IRESSA) 250 MG tablet Take 1 tablet (250 mg total) by mouth daily.  Marland Kitchen HYDROmorphone (DILAUDID) 4 MG tablet Take 1 tablet (4 mg total) by mouth every 6 (six) hours as needed for severe pain.  Marland Kitchen loperamide (IMODIUM A-D) 2 MG tablet Take 1 mg by mouth  daily as needed for diarrhea or loose stools.  . LUTEIN PO Take 1 capsule by mouth daily. Reported on 10/27/2015  . magic mouthwash SOLN Take 5 mLs by mouth 4 (four) times daily as needed for mouth pain.  . magnesium 30 MG tablet Take 30 mg by mouth at bedtime.   . mesalamine (APRISO) 0.375 g 24 hr capsule TAKE 2 CAPSULE BY MOUTH TWICE DAILY  . Multiple Vitamins-Minerals (AIRBORNE) CHEW Chew 1 tablet by mouth daily.  . Multiple Vitamins-Minerals (HAIR/SKIN/NAILS) TABS Take 1 tablet by mouth daily.  . Multiple Vitamins-Minerals (PRESERVISION AREDS 2 PO) Take 1 tablet by mouth 2 (two) times daily with a meal.  . NON FORMULARY Eye In jection every 14 weeks. Both eyes are injected. Last injection was 02/23/2020.  . Omega-3 Fatty Acids (FISH OIL) 1200 MG CAPS Take 1,200 mg by mouth daily.  . ondansetron (ZOFRAN ODT) 4 MG disintegrating tablet Take 1 tablet (4 mg total) by mouth every 8 (eight) hours as needed.  . potassium chloride (KLOR-CON) 10 MEQ tablet TAKE 1 TABLET BY MOUTH EVERY DAY  . warfarin (COUMADIN) 5 MG tablet TAKE 1 TABLET BY MOUTH DAILY EXCEPT TAKE ONE AND 1/2 TABLETS ON TUESDAYS AND FRIDAYS (Patient taking differently: TAKE 1 TABLET BY MOUTH DAILY EXCEPT TAKE ONE AND  1 1/2 TABLETS ON Monday ,  Wednesday  AND FRIDAYS)  . [DISCONTINUED] Simethicone (PHAZYME) 180 MG CAPS Take 1 capsule (180 mg total) by mouth 3 (three) times daily as needed. (Patient not taking: Reported on 02/24/2020)   No facility-administered encounter medications on file as of 02/24/2020.     Objective: Blood pressure 135/79, pulse 65, temperature 98.4 F (36.9 C), temperature source Oral, height 5' 3"  (1.6 m), weight 101 lb 3.2 oz (45.9 kg). Patient was alert and in no acute distress. She is wearing a mask. Conjunctiva is pink. Sclera is nonicteric Oropharyngeal mucosa is normal. No neck masses or thyromegaly noted. Cardiac exam with irregular rhythm normal S1 and S2. No murmur or gallop noted. Lungs are  clear to auscultation. Abdomen is symmetrical soft and nontender with organomegaly or masses. No LE edema or clubbing noted.  Labs/studies Results:  CBC Latest Ref Rng & Units 01/26/2020 11/24/2019 09/10/2019  WBC 4.0 - 10.5 K/uL 4.2 4.4 3.8(L)  Hemoglobin 12.0 - 15.0 g/dL 14.0 14.4 14.2  Hematocrit 36 - 46 % 42.4 43.9 44.8  Platelets 150 - 400 K/uL 236 230 222    CMP Latest Ref Rng & Units 01/26/2020 11/24/2019 09/10/2019  Glucose 70 - 99 mg/dL 96 84 87  BUN 8 - 23 mg/dL 16 19 17   Creatinine 0.44 - 1.00 mg/dL 0.92 0.96 0.70  Sodium 135 - 145 mmol/L 140 141 140  Potassium 3.5 - 5.1 mmol/L 4.3 4.1 4.5  Chloride 98 - 111 mmol/L 104 104 101  CO2 22 - 32 mmol/L 31 29 28   Calcium 8.9 - 10.3 mg/dL 9.3 9.7 9.5  Total Protein 6.5 - 8.1 g/dL 7.0 7.2 7.2  Total Bilirubin 0.3 - 1.2 mg/dL 0.6 0.6 0.4  Alkaline Phos 38 - 126 U/L 101 86 81  AST 15 - 41 U/L 30 31 33  ALT 0 - 44 U/L 19 19 22     Hepatic Function Latest Ref Rng & Units 01/26/2020 11/24/2019 09/10/2019  Total Protein 6.5 - 8.1 g/dL 7.0 7.2 7.2  Albumin 3.5 - 5.0 g/dL 3.6 3.7 3.8  AST 15 - 41 U/L 30 31 33  ALT 0 - 44 U/L 19 19 22   Alk Phosphatase 38 - 126 U/L 101 86 81  Total Bilirubin 0.3 - 1.2 mg/dL 0.6 0.6 0.4  Bilirubin, Direct 0.1 - 0.5 mg/dL - - -      Assessment:  #1.  Crohn's colitis.  Disease duration 9 years.  Course has been rather mild she is doing well with oral mesalamine.  She will continue this therapy and definitely as long as it is working.  Recent lab studies including CBC have been normal.  Plan:  New prescription for Apriso sent to patient's pharmacy for 1 month 11 refills. Patient will call office if she has rectal bleeding or diarrhea otherwise return for office visit in 1 year.

## 2020-02-24 NOTE — Patient Instructions (Signed)
Please call if you experience rectal bleeding or diarrhes

## 2020-02-25 ENCOUNTER — Telehealth: Payer: Self-pay | Admitting: Medical Oncology

## 2020-02-25 ENCOUNTER — Other Ambulatory Visit: Payer: Self-pay | Admitting: Medical Oncology

## 2020-02-25 DIAGNOSIS — C3490 Malignant neoplasm of unspecified part of unspecified bronchus or lung: Secondary | ICD-10-CM

## 2020-02-25 NOTE — Telephone Encounter (Signed)
Gave the pt the number to schedule lab appt at [facility].

## 2020-03-02 ENCOUNTER — Ambulatory Visit (INDEPENDENT_AMBULATORY_CARE_PROVIDER_SITE_OTHER): Payer: Medicare Other | Admitting: *Deleted

## 2020-03-02 DIAGNOSIS — Z5181 Encounter for therapeutic drug level monitoring: Secondary | ICD-10-CM | POA: Diagnosis not present

## 2020-03-02 DIAGNOSIS — I4891 Unspecified atrial fibrillation: Secondary | ICD-10-CM | POA: Diagnosis not present

## 2020-03-02 LAB — POCT INR: INR: 2 (ref 2.0–3.0)

## 2020-03-02 NOTE — Patient Instructions (Signed)
Increase warfarin to 1 1/2 tablets daily except 1 tablet on Sundays, Tuesdays and Thursdays Recheck in 4 weeks

## 2020-03-06 DIAGNOSIS — Z23 Encounter for immunization: Secondary | ICD-10-CM | POA: Diagnosis not present

## 2020-03-18 ENCOUNTER — Inpatient Hospital Stay (HOSPITAL_COMMUNITY): Payer: Medicare Other | Attending: Hematology

## 2020-03-18 ENCOUNTER — Other Ambulatory Visit: Payer: Self-pay

## 2020-03-18 DIAGNOSIS — C3490 Malignant neoplasm of unspecified part of unspecified bronchus or lung: Secondary | ICD-10-CM | POA: Diagnosis not present

## 2020-03-18 LAB — CBC WITH DIFFERENTIAL/PLATELET
Abs Immature Granulocytes: 0.01 10*3/uL (ref 0.00–0.07)
Basophils Absolute: 0 10*3/uL (ref 0.0–0.1)
Basophils Relative: 1 %
Eosinophils Absolute: 0.2 10*3/uL (ref 0.0–0.5)
Eosinophils Relative: 5 %
HCT: 44.7 % (ref 36.0–46.0)
Hemoglobin: 14.3 g/dL (ref 12.0–15.0)
Immature Granulocytes: 0 %
Lymphocytes Relative: 25 %
Lymphs Abs: 0.9 10*3/uL (ref 0.7–4.0)
MCH: 30.4 pg (ref 26.0–34.0)
MCHC: 32 g/dL (ref 30.0–36.0)
MCV: 94.9 fL (ref 80.0–100.0)
Monocytes Absolute: 0.7 10*3/uL (ref 0.1–1.0)
Monocytes Relative: 18 %
Neutro Abs: 1.9 10*3/uL (ref 1.7–7.7)
Neutrophils Relative %: 51 %
Platelets: 318 10*3/uL (ref 150–400)
RBC: 4.71 MIL/uL (ref 3.87–5.11)
RDW: 13.8 % (ref 11.5–15.5)
WBC: 3.7 10*3/uL — ABNORMAL LOW (ref 4.0–10.5)
nRBC: 0 % (ref 0.0–0.2)

## 2020-03-18 LAB — COMPREHENSIVE METABOLIC PANEL
ALT: 16 U/L (ref 0–44)
AST: 28 U/L (ref 15–41)
Albumin: 3.7 g/dL (ref 3.5–5.0)
Alkaline Phosphatase: 106 U/L (ref 38–126)
Anion gap: 8 (ref 5–15)
BUN: 19 mg/dL (ref 8–23)
CO2: 30 mmol/L (ref 22–32)
Calcium: 9.1 mg/dL (ref 8.9–10.3)
Chloride: 99 mmol/L (ref 98–111)
Creatinine, Ser: 0.68 mg/dL (ref 0.44–1.00)
GFR, Estimated: >60 mL/min (ref >60)
Glucose, Bld: 100 mg/dL — ABNORMAL HIGH (ref 70–99)
Potassium: 4.4 mmol/L (ref 3.5–5.1)
Sodium: 137 mmol/L (ref 135–145)
Total Bilirubin: 0.5 mg/dL (ref 0.3–1.2)
Total Protein: 7.3 g/dL (ref 6.5–8.1)

## 2020-03-19 ENCOUNTER — Ambulatory Visit (HOSPITAL_COMMUNITY)
Admission: RE | Admit: 2020-03-19 | Discharge: 2020-03-19 | Disposition: A | Discharge status: Home / Self Care | Payer: Medicare Other | Source: Ambulatory Visit | Attending: Internal Medicine | Admitting: Internal Medicine

## 2020-03-19 ENCOUNTER — Other Ambulatory Visit: Payer: Self-pay

## 2020-03-19 DIAGNOSIS — M47816 Spondylosis without myelopathy or radiculopathy, lumbar region: Secondary | ICD-10-CM | POA: Diagnosis not present

## 2020-03-19 DIAGNOSIS — C3490 Malignant neoplasm of unspecified part of unspecified bronchus or lung: Secondary | ICD-10-CM | POA: Diagnosis not present

## 2020-03-19 DIAGNOSIS — J929 Pleural plaque without asbestos: Secondary | ICD-10-CM | POA: Diagnosis not present

## 2020-03-19 DIAGNOSIS — M47814 Spondylosis without myelopathy or radiculopathy, thoracic region: Secondary | ICD-10-CM | POA: Diagnosis not present

## 2020-03-19 MED ORDER — IOHEXOL 300 MG/ML  SOLN
75.0000 mL | Freq: Once | INTRAMUSCULAR | Status: AC | PRN
  Administered 2020-03-19: 75 mL via INTRAVENOUS

## 2020-03-22 ENCOUNTER — Other Ambulatory Visit: Payer: Medicare Other

## 2020-03-23 ENCOUNTER — Other Ambulatory Visit: Payer: Self-pay

## 2020-03-23 ENCOUNTER — Encounter: Payer: Self-pay | Admitting: Internal Medicine

## 2020-03-23 ENCOUNTER — Inpatient Hospital Stay: Payer: Medicare Other | Attending: Internal Medicine | Admitting: Internal Medicine

## 2020-03-23 VITALS — BP 137/70 | HR 77 | Temp 97.1°F | Resp 18 | Ht 63.0 in

## 2020-03-23 DIAGNOSIS — Z886 Allergy status to analgesic agent status: Secondary | ICD-10-CM | POA: Diagnosis not present

## 2020-03-23 DIAGNOSIS — I517 Cardiomegaly: Secondary | ICD-10-CM | POA: Diagnosis not present

## 2020-03-23 DIAGNOSIS — E049 Nontoxic goiter, unspecified: Secondary | ICD-10-CM | POA: Insufficient documentation

## 2020-03-23 DIAGNOSIS — Z7901 Long term (current) use of anticoagulants: Secondary | ICD-10-CM | POA: Insufficient documentation

## 2020-03-23 DIAGNOSIS — I700 Atherosclerosis of aorta: Secondary | ICD-10-CM | POA: Diagnosis not present

## 2020-03-23 DIAGNOSIS — R222 Localized swelling, mass and lump, trunk: Secondary | ICD-10-CM | POA: Insufficient documentation

## 2020-03-23 DIAGNOSIS — Z888 Allergy status to other drugs, medicaments and biological substances status: Secondary | ICD-10-CM | POA: Insufficient documentation

## 2020-03-23 DIAGNOSIS — Z881 Allergy status to other antibiotic agents status: Secondary | ICD-10-CM | POA: Diagnosis not present

## 2020-03-23 DIAGNOSIS — C3411 Malignant neoplasm of upper lobe, right bronchus or lung: Secondary | ICD-10-CM

## 2020-03-23 DIAGNOSIS — Z8719 Personal history of other diseases of the digestive system: Secondary | ICD-10-CM | POA: Insufficient documentation

## 2020-03-23 DIAGNOSIS — Z880 Allergy status to penicillin: Secondary | ICD-10-CM | POA: Insufficient documentation

## 2020-03-23 DIAGNOSIS — Z5181 Encounter for therapeutic drug level monitoring: Secondary | ICD-10-CM

## 2020-03-23 DIAGNOSIS — Z885 Allergy status to narcotic agent status: Secondary | ICD-10-CM | POA: Insufficient documentation

## 2020-03-23 DIAGNOSIS — Z79899 Other long term (current) drug therapy: Secondary | ICD-10-CM | POA: Insufficient documentation

## 2020-03-23 DIAGNOSIS — I4891 Unspecified atrial fibrillation: Secondary | ICD-10-CM | POA: Insufficient documentation

## 2020-03-23 NOTE — Progress Notes (Signed)
[facility] Telephone:(336) [phone]   Fax:(336) [phone]  OFFICE PROGRESS NOTE  [name], MD [place] [address]  DIAGNOSIS: Recurrent non-small cell lung cancer presented with bilateral lung nodules in October 2016 initially diagnosed as a stage IA adenocarcinoma with positive EGFR mutation with deletion in exon 37 in December 2012.  PRIOR THERAPY: Status post bronchoscopy with right VATS and right upper lobectomy with lymph node dissection under the care of Dr. Servando Snare on 05/31/2011.  CURRENT THERAPY: Iressa 250 mg by mouth daily started 04/17/2015, status post 59 months of treatment.  INTERVAL HISTORY: Natalie Carlson 86 y.o. female returns to the clinic today for follow-up visit.  The patient is feeling fine today with no concerning complaints.  She denied having any current chest pain, shortness of breath, cough or hemoptysis.  She denied having any fever or chills.  She has no nausea, vomiting, diarrhea or constipation.  She denied having any headache or visual changes.  She continues to tolerate her treatment with Iressa fairly well.  The patient had repeat CT scan of the chest performed recently and she is here for evaluation and discussion of her risk her results.  MEDICAL HISTORY: Past Medical History:  Diagnosis Date  . Arthritis   . Atrial fibrillation ([place])   . Cataract of both eyes   . Chronic back pain   . Chronic diarrhea   . Colitis   . Hemorrhoids   . History of colon polyps   . History of migraines   . Hyperlipidemia   . Hypothyroidism   . Macular degeneration, dry   . Malignant neoplasm of right upper lobe of lung (HCC)    Stage 1A  EGFR positive  Deletion in exon 19.  ALK-negative  Right Upper Lobe Lung Mass  SUV 2.9 on PET Status post VATS and right upper lobe lobectomy 05/31/2011  . Mucus in stool   . Nephrolithiasis    1994 - passed on its on    ALLERGIES:  is allergic to doxycycline, entocort ec [budesonide],  neurontin [gabapentin], vicodin [hydrocodone-acetaminophen], adhesive [tape], amoxicillin, chocolate, codeine, levaquin [levofloxacin in d5w], morphine and related, naprosyn [naproxen], penicillins, prednisone, tramadol, and uncoded nonscreenable allergen.  MEDICATIONS:  Current Outpatient Medications  Medication Sig Dispense Refill  . acetaminophen (TYLENOL) 500 MG tablet Take 500 mg by mouth. Patient takes as needed , which is seldom.    . Calcium Carbonate (CALCIUM 600 PO) Take 1 tablet by mouth 2 (two) times daily.    . diclofenac sodium (VOLTAREN) 1 % GEL Apply 1 application topically 2 (two) times daily as needed (for neck pain).     Marland Kitchen diltiazem (CARDIZEM CD) 180 MG 24 hr capsule TAKE ONE CAPSULE BY MOUTH EVERY DAY 90 capsule 2  . furosemide (LASIX) 20 MG tablet TAKE 1 TABLET BY MOUTH EVERY DAY AS NEEDED FOR FLUID 90 tablet 1  . gefitinib (IRESSA) 250 MG tablet Take 1 tablet (250 mg total) by mouth daily. 30 tablet 5  . HYDROmorphone (DILAUDID) 4 MG tablet Take 1 tablet (4 mg total) by mouth every 6 (six) hours as needed for severe pain. 20 tablet 0  . loperamide (IMODIUM A-D) 2 MG tablet Take 1 mg by mouth daily as needed for diarrhea or loose stools.    . LUTEIN PO Take 1 capsule by mouth daily. Reported on 10/27/2015    . magic mouthwash SOLN Take 5 mLs by mouth 4 (four) times daily as needed  for mouth pain. 240 mL 0  . magnesium 30 MG tablet Take 30 mg by mouth at bedtime.     . mesalamine (APRISO) 0.375 g 24 hr capsule TAKE 2 CAPSULE BY MOUTH TWICE DAILY 120 capsule 11  . Multiple Vitamins-Minerals (AIRBORNE) CHEW Chew 1 tablet by mouth daily.    . Multiple Vitamins-Minerals (HAIR/SKIN/NAILS) TABS Take 1 tablet by mouth daily.    . Multiple Vitamins-Minerals (PRESERVISION AREDS 2 PO) Take 1 tablet by mouth 2 (two) times daily with a meal.    . NON FORMULARY Eye In jection every 14 weeks. Both eyes are injected. Last injection was 02/23/2020.    . Omega-3 Fatty Acids (FISH OIL) 1200 MG  CAPS Take 1,200 mg by mouth daily.    . ondansetron (ZOFRAN ODT) 4 MG disintegrating tablet Take 1 tablet (4 mg total) by mouth every 8 (eight) hours as needed. 10 tablet 1  . potassium chloride (KLOR-CON) 10 MEQ tablet TAKE 1 TABLET BY MOUTH EVERY DAY 90 tablet 2  . warfarin (COUMADIN) 5 MG tablet TAKE 1 TABLET BY MOUTH DAILY EXCEPT TAKE ONE AND 1/2 TABLETS ON TUESDAYS AND FRIDAYS (Patient taking differently: TAKE 1 TABLET BY MOUTH DAILY EXCEPT TAKE ONE AND  1 1/2 TABLETS ON Monday , Wednesday  AND FRIDAYS) 40 tablet 3   No current facility-administered medications for this visit.    SURGICAL HISTORY:  Past Surgical History:  Procedure Laterality Date  . Bladder tack  1996  . CARDIOVERSION  11/02/09   x 2  . CATARACT EXTRACTION  2010/2011  . COLONOSCOPY  3 YRS AGO  . COLONOSCOPY  03/06/2011   Procedure: COLONOSCOPY;  Surgeon: Rogene Houston, MD;  [facility];  Service: Endoscopy;  Laterality: N/A;  7:30 am  . ESOPHAGOGASTRODUODENOSCOPY    . FLEXIBLE SIGMOIDOSCOPY N/A 10/04/2012   Procedure: FLEXIBLE SIGMOIDOSCOPY;  Surgeon: Rogene Houston, MD;  Location: [facility];  Service: Endoscopy;  Laterality: N/A;  11:00-moved to 1015 Ann to notify pt  . THYROID SURGERY  1961  . TONSILLECTOMY    . VIDEO BRONCHOSCOPY  05/31/2011   Procedure: VIDEO BRONCHOSCOPY;  Surgeon: Grace Isaac, MD;  Location: [facility] OR;  Service: Thoracic;  Laterality: N/A;    REVIEW OF SYSTEMS:  Constitutional: negative Eyes: negative Ears, nose, mouth, throat, and face: negative Respiratory: negative Cardiovascular: negative Gastrointestinal: negative Genitourinary:negative Integument/breast: negative Hematologic/lymphatic: negative Musculoskeletal:negative Neurological: negative Behavioral/Psych: negative Endocrine: negative Allergic/Immunologic: negative   PHYSICAL EXAMINATION: General appearance: alert, cooperative and no distress Head: Normocephalic, without obvious abnormality,  atraumatic Neck: no adenopathy, no JVD, supple, symmetrical, trachea midline and thyroid not enlarged, symmetric, no tenderness/mass/nodules Lymph nodes: Cervical, supraclavicular, and axillary nodes normal. Resp: clear to auscultation bilaterally Back: symmetric, no curvature. ROM normal. No CVA tenderness. Cardio: regular rate and rhythm, S1, S2 normal, no murmur, click, rub or gallop GI: soft, non-tender; bowel sounds normal; no masses,  no organomegaly Extremities: extremities normal, atraumatic, no cyanosis or edema Neurologic: Alert and oriented X 3, normal strength and tone. Normal symmetric reflexes. Normal coordination and gait  ECOG PERFORMANCE STATUS: 0 - Asymptomatic  Blood pressure 137/70, pulse 77, temperature (!) 97.1 F (36.2 C), temperature source Tympanic, resp. rate 18, height _0  (1.6 m), SpO2 97 %.  LABORATORY DATA: Lab Results  Component Value Date   WBC 3.7 (L) 03/18/2020   HGB 14.3 03/18/2020   HCT 44.7 03/18/2020   MCV 94.9 03/18/2020   PLT 318 03/18/2020      Chemistry  Component Value Date/Time   NA 137 03/18/2020 1027   NA 141 05/14/2017 1304   K 4.4 03/18/2020 1027   K 4.3 05/14/2017 1304   CL 99 03/18/2020 1027   CO2 30 03/18/2020 1027   CO2 30 (H) 05/14/2017 1304   BUN 19 03/18/2020 1027   BUN 19.0 05/14/2017 1304   CREATININE 0.68 03/18/2020 1027   CREATININE 0.92 01/26/2020 1300   CREATININE 0.8 05/14/2017 1304      Component Value Date/Time   CALCIUM 9.1 03/18/2020 1027   CALCIUM 9.1 05/14/2017 1304   ALKPHOS 106 03/18/2020 1027   ALKPHOS 86 05/14/2017 1304   AST 28 03/18/2020 1027   AST 30 01/26/2020 1300   AST 25 05/14/2017 1304   ALT 16 03/18/2020 1027   ALT 19 01/26/2020 1300   ALT 15 05/14/2017 1304   BILITOT 0.5 03/18/2020 1027   BILITOT 0.6 01/26/2020 1300   BILITOT 0.57 05/14/2017 1304       RADIOGRAPHIC STUDIES: CT Chest W Contrast  Result Date: 03/19/2020 CLINICAL DATA:  Non-small cell lung cancer staging  in this 86 year old female EXAM: CT CHEST WITH CONTRAST TECHNIQUE: Multidetector CT imaging of the chest was performed during intravenous contrast administration. CONTRAST:  32m OMNIPAQUE IOHEXOL 300 MG/ML  SOLN COMPARISON:  September 12, 2019 FINDINGS: Cardiovascular: 3.7 cm ascending thoracic aorta. RIGHT heart enlargement without central pulmonary vascular dilation. Enlarged LEFT atrium. Calcified and noncalcified plaque throughout the thoracic aorta. Mediastinum/Nodes: Stable heterogeneous RIGHT thyroid lesion approximately 2.1 cm. Generalized thyroid enlargement of the RIGHT lobe of the thyroid relative to the LEFT. No thoracic inlet adenopathy. No axillary lymphadenopathy. No mediastinal adenopathy. Lungs/Pleura: Mild septal thickening and subpleural reticulation with similar appearance to previous imaging. No consolidation. No pleural effusion. Stable small irregular nodule at the LEFT lung apex 7 x 7 mm unchanged dating back to April of 2020. No new or suspicious pulmonary nodule or mass. Upper Abdomen: Incidental imaging of upper abdominal contents is unremarkable. Musculoskeletal: No acute bone finding. No destructive bone process. Spinal degenerative changes and spinal curvature, levo convexity in the lower thoracic/upper lumbar spine with similar appearance. IMPRESSION: 1. Stable small irregular nodule at the LEFT lung apex unchanged dating back to April of 2020. No new or suspicious pulmonary nodule or mass. 2. Stable heterogeneous RIGHT thyroid lesion. Generalized thyroid enlargement of the RIGHT lobe of the thyroid relative to the LEFT. This is been documented on prior studies. Finding is unchanged. 3. Interstitial thickening and subpleural reticulation with similar appearance suggests chronic fibrotic lung disease 4. Aortic atherosclerosis. Aortic Atherosclerosis (ICD10-I70.0). Electronically Signed   By: GZetta BillsM.D.   On: 03/19/2020 16:55    ASSESSMENT AND PLAN:  This is a very pleasant 86 years old white female with recurrent non-small cell lung cancer, adenocarcinoma with positive EGFR mutation. The patient has been on treatment with Iressa 250 mg p.o. daily for the last 59 months. The patient continues to tolerate her treatment with Iressa fairly well with no concerning adverse effects. She had repeat CT scan of the chest performed recently.  I personally and independently reviewed the scan and discussed the results with the patient today. Her scan showed no concerning findings for disease progression. I recommended for the patient to continue her current treatment with Iressa with the same dose. She will come back for follow-up visit in 2 months for evaluation and repeat blood work. She was advised to call immediately if she has any concerning symptoms in the interval. The patient  voices understanding of current disease status and treatment options and is in agreement with the current care plan. All questions were answered. The patient knows to call the clinic with any problems, questions or concerns. We can certainly see the patient much sooner if necessary.  Disclaimer: This note was dictated with voice recognition software. Similar sounding words can inadvertently be transcribed and may not be corrected upon review.

## 2020-03-25 ENCOUNTER — Other Ambulatory Visit: Payer: Self-pay | Admitting: Cardiology

## 2020-03-30 ENCOUNTER — Ambulatory Visit (INDEPENDENT_AMBULATORY_CARE_PROVIDER_SITE_OTHER): Payer: Medicare Other | Admitting: *Deleted

## 2020-03-30 DIAGNOSIS — Z5181 Encounter for therapeutic drug level monitoring: Secondary | ICD-10-CM

## 2020-03-30 DIAGNOSIS — I4891 Unspecified atrial fibrillation: Secondary | ICD-10-CM

## 2020-03-30 LAB — POCT INR: INR: 2.3 (ref 2.0–3.0)

## 2020-03-30 NOTE — Patient Instructions (Signed)
Continue warfarin 1 1/2 tablets daily except 1 tablet on Sundays, Tuesdays and Thursdays Recheck in 6 weeks

## 2020-04-01 ENCOUNTER — Ambulatory Visit (INDEPENDENT_AMBULATORY_CARE_PROVIDER_SITE_OTHER): Payer: Medicare Other | Admitting: Gastroenterology

## 2020-04-20 ENCOUNTER — Telehealth: Payer: Self-pay

## 2020-04-20 ENCOUNTER — Other Ambulatory Visit: Payer: Self-pay | Admitting: Internal Medicine

## 2020-04-20 DIAGNOSIS — C3490 Malignant neoplasm of unspecified part of unspecified bronchus or lung: Secondary | ICD-10-CM

## 2020-04-20 MED ORDER — IRESSA 250 MG PO TABS: 250.0000 mg | ORAL_TABLET | Freq: Every day | ORAL | 5 refills | Status: DC

## 2020-04-20 NOTE — Telephone Encounter (Signed)
Pt is requesting a refill of IRESSA  Last visit 03/23/20  Next Visit: 05/24/20

## 2020-05-18 ENCOUNTER — Ambulatory Visit (INDEPENDENT_AMBULATORY_CARE_PROVIDER_SITE_OTHER): Payer: Medicare Other | Admitting: *Deleted

## 2020-05-18 ENCOUNTER — Telehealth: Payer: Self-pay

## 2020-05-18 ENCOUNTER — Other Ambulatory Visit: Payer: Self-pay | Admitting: Internal Medicine

## 2020-05-18 DIAGNOSIS — I4891 Unspecified atrial fibrillation: Secondary | ICD-10-CM | POA: Diagnosis not present

## 2020-05-18 DIAGNOSIS — Z5181 Encounter for therapeutic drug level monitoring: Secondary | ICD-10-CM | POA: Diagnosis not present

## 2020-05-18 LAB — POCT INR: INR: 1.9 — AB (ref 2.0–3.0)

## 2020-05-18 NOTE — Patient Instructions (Signed)
Take warfarin 1 1/2 tablet tonight then resume 1 1/2 tablets daily except 1 tablet on Sundays, Tuesdays and Thursdays Recheck in 6 weeks

## 2020-05-18 NOTE — Telephone Encounter (Signed)
Pt requesting refill of rx Iressa  Last sent 04/20/20

## 2020-05-18 NOTE — Telephone Encounter (Signed)
It was ordered on 04/20/20 with 5 refills. Thank you

## 2020-05-19 NOTE — Telephone Encounter (Signed)
I called pt back and advised as indicated. She expressed understaniding of this information and advised she will call her pharmacy back.

## 2020-05-20 ENCOUNTER — Telehealth: Payer: Self-pay

## 2020-05-20 NOTE — Telephone Encounter (Signed)
Oral Oncology Patient Advocate Encounter  Patient has been approved for copay assistance with [employer] (TAF).  The [place] will cover all copayment expenses for Iressa for the remainder of the calendar year.    The billing information is as follows and has been shared with [place].   Member ID: [id number] Group ID: [id number] PCN: [id number] BIN: [id number] Eligibility Dates: 05/15/20 to 05/14/21  Fund: [place] Patient [place] Phone [phone] Fax [phone] 05/20/2020 11:07 AM

## 2020-05-24 ENCOUNTER — Inpatient Hospital Stay: Payer: Medicare Other

## 2020-05-24 ENCOUNTER — Other Ambulatory Visit: Payer: Self-pay

## 2020-05-24 ENCOUNTER — Inpatient Hospital Stay: Payer: Medicare Other | Attending: Internal Medicine | Admitting: Internal Medicine

## 2020-05-24 ENCOUNTER — Encounter: Payer: Self-pay | Admitting: Internal Medicine

## 2020-05-24 ENCOUNTER — Telehealth: Payer: Self-pay | Admitting: Internal Medicine

## 2020-05-24 VITALS — BP 141/75 | HR 83 | Temp 98.1°F | Resp 16 | Ht 63.0 in | Wt 105.0 lb

## 2020-05-24 DIAGNOSIS — C3411 Malignant neoplasm of upper lobe, right bronchus or lung: Secondary | ICD-10-CM | POA: Insufficient documentation

## 2020-05-24 DIAGNOSIS — Z880 Allergy status to penicillin: Secondary | ICD-10-CM | POA: Insufficient documentation

## 2020-05-24 DIAGNOSIS — Z8719 Personal history of other diseases of the digestive system: Secondary | ICD-10-CM | POA: Insufficient documentation

## 2020-05-24 DIAGNOSIS — Z5181 Encounter for therapeutic drug level monitoring: Secondary | ICD-10-CM | POA: Diagnosis not present

## 2020-05-24 DIAGNOSIS — Z881 Allergy status to other antibiotic agents status: Secondary | ICD-10-CM | POA: Insufficient documentation

## 2020-05-24 DIAGNOSIS — Z7901 Long term (current) use of anticoagulants: Secondary | ICD-10-CM | POA: Insufficient documentation

## 2020-05-24 DIAGNOSIS — I4891 Unspecified atrial fibrillation: Secondary | ICD-10-CM | POA: Diagnosis not present

## 2020-05-24 DIAGNOSIS — Z885 Allergy status to narcotic agent status: Secondary | ICD-10-CM | POA: Diagnosis not present

## 2020-05-24 DIAGNOSIS — Z888 Allergy status to other drugs, medicaments and biological substances status: Secondary | ICD-10-CM | POA: Insufficient documentation

## 2020-05-24 DIAGNOSIS — Z79899 Other long term (current) drug therapy: Secondary | ICD-10-CM | POA: Insufficient documentation

## 2020-05-24 DIAGNOSIS — Z886 Allergy status to analgesic agent status: Secondary | ICD-10-CM | POA: Insufficient documentation

## 2020-05-24 LAB — CMP (CANCER CENTER ONLY)
ALT: 16 U/L (ref 0–44)
AST: 28 U/L (ref 15–41)
Albumin: 3.7 g/dL (ref 3.5–5.0)
Alkaline Phosphatase: 91 U/L (ref 38–126)
Anion gap: 8 (ref 5–15)
BUN: 18 mg/dL (ref 8–23)
CO2: 28 mmol/L (ref 22–32)
Calcium: 9.3 mg/dL (ref 8.9–10.3)
Chloride: 105 mmol/L (ref 98–111)
Creatinine: 0.81 mg/dL (ref 0.44–1.00)
GFR, Estimated: >60 mL/min (ref >60)
Glucose, Bld: 98 mg/dL (ref 70–99)
Potassium: 4.5 mmol/L (ref 3.5–5.1)
Sodium: 141 mmol/L (ref 135–145)
Total Bilirubin: 0.6 mg/dL (ref 0.3–1.2)
Total Protein: 7.2 g/dL (ref 6.5–8.1)

## 2020-05-24 LAB — CBC WITH DIFFERENTIAL (CANCER CENTER ONLY)
Abs Immature Granulocytes: 0 10*3/uL (ref 0.00–0.07)
Basophils Absolute: 0 10*3/uL (ref 0.0–0.1)
Basophils Relative: 1 %
Eosinophils Absolute: 0.1 10*3/uL (ref 0.0–0.5)
Eosinophils Relative: 4 %
HCT: 40.8 % (ref 36.0–46.0)
Hemoglobin: 13.2 g/dL (ref 12.0–15.0)
Immature Granulocytes: 0 %
Lymphocytes Relative: 29 %
Lymphs Abs: 1.2 10*3/uL (ref 0.7–4.0)
MCH: 30.3 pg (ref 26.0–34.0)
MCHC: 32.4 g/dL (ref 30.0–36.0)
MCV: 93.6 fL (ref 80.0–100.0)
Monocytes Absolute: 0.6 10*3/uL (ref 0.1–1.0)
Monocytes Relative: 16 %
Neutro Abs: 2 10*3/uL (ref 1.7–7.7)
Neutrophils Relative %: 50 %
Platelet Count: 223 10*3/uL (ref 150–400)
RBC: 4.36 MIL/uL (ref 3.87–5.11)
RDW: 14.1 % (ref 11.5–15.5)
WBC Count: 3.9 10*3/uL — ABNORMAL LOW (ref 4.0–10.5)
nRBC: 0 % (ref 0.0–0.2)

## 2020-05-24 NOTE — Telephone Encounter (Signed)
Scheduled appointments per 1/10 los. Spoke to patient who is aware of appointments date and times. Gave patient calendar print out.

## 2020-05-24 NOTE — Progress Notes (Signed)
[facility] Telephone:(336) [phone]   Fax:(336) [phone]  OFFICE PROGRESS NOTE  Natalie Bis, MD [place] [address]  DIAGNOSIS: Recurrent non-small cell lung cancer presented with bilateral lung nodules in October 2016 initially diagnosed as a stage IA adenocarcinoma with positive EGFR mutation with deletion in exon 73 in December 2012.  PRIOR THERAPY: Status post bronchoscopy with right VATS and right upper lobectomy with lymph node dissection under the care of Dr. Servando Carlson on 05/31/2011.  CURRENT THERAPY: Iressa 250 mg by mouth daily started 04/17/2015, status post 61 months of treatment.  INTERVAL HISTORY: Natalie Carlson 87 y.o. female returns to the clinic today for 16-monthfollow-up visit.  The patient is feeling fine today with no concerning complaints.  She denied having any skin rash or diarrhea.  She denied having any chest pain, shortness of breath, cough or hemoptysis.  She has no nausea, vomiting, abdominal pain or constipation.  She denied having any headache or visual changes.  She continues to tolerate her treatment with Iressa fairly well.  The patient is here today for evaluation and repeat blood work.  MEDICAL HISTORY: Past Medical History:  Diagnosis Date  . Arthritis   . Atrial fibrillation ([place]   . Cataract of both eyes   . Chronic back pain   . Chronic diarrhea   . Colitis   . Hemorrhoids   . History of colon polyps   . History of migraines   . Hyperlipidemia   . Hypothyroidism   . Macular degeneration, dry   . Malignant neoplasm of right upper lobe of lung (HCC)    Stage 1A  EGFR positive  Deletion in exon 19.  ALK-negative  Right Upper Lobe Lung Mass  SUV 2.9 on PET Status post VATS and right upper lobe lobectomy 05/31/2011  . Mucus in stool   . Nephrolithiasis    1994 - passed on its on    ALLERGIES:  is allergic to doxycycline, entocort ec [budesonide], neurontin [gabapentin], vicodin [hydrocodone-acetaminophen],  adhesive [tape], amoxicillin, chocolate, codeine, levaquin [levofloxacin in d5w], morphine and related, naprosyn [naproxen], penicillins, prednisone, tramadol, and uncoded nonscreenable allergen.  MEDICATIONS:  Current Outpatient Medications  Medication Sig Dispense Refill  . acetaminophen (TYLENOL) 500 MG tablet Take 500 mg by mouth. Patient takes as needed , which is seldom.    . Calcium Carbonate (CALCIUM 600 PO) Take 1 tablet by mouth 2 (two) times daily.    . diclofenac sodium (VOLTAREN) 1 % GEL Apply 1 application topically 2 (two) times daily as needed (for neck pain).     .Marland Kitchendiltiazem (CARDIZEM CD) 180 MG 24 hr capsule TAKE ONE CAPSULE BY MOUTH EVERY DAY 90 capsule 2  . furosemide (LASIX) 20 MG tablet TAKE 1 TABLET BY MOUTH EVERY DAY AS NEEDED FOR FLUID 90 tablet 1  . gefitinib (IRESSA) 250 MG tablet Take 1 tablet (250 mg total) by mouth daily. 30 tablet 5  . HYDROmorphone (DILAUDID) 4 MG tablet Take 1 tablet (4 mg total) by mouth every 6 (six) hours as needed for severe pain. 20 tablet 0  . loperamide (IMODIUM A-D) 2 MG tablet Take 1 mg by mouth daily as needed for diarrhea or loose stools.    . LUTEIN PO Take 1 capsule by mouth daily. Reported on 10/27/2015    . magic mouthwash SOLN Take 5 mLs by mouth 4 (four) times daily as needed for mouth pain. 240 mL 0  . magnesium 30 MG  tablet Take 30 mg by mouth at bedtime.     . mesalamine (APRISO) 0.375 g 24 hr capsule TAKE 2 CAPSULE BY MOUTH TWICE DAILY 120 capsule 11  . Multiple Vitamins-Minerals (AIRBORNE) CHEW Chew 1 tablet by mouth daily.    . Multiple Vitamins-Minerals (HAIR/SKIN/NAILS) TABS Take 1 tablet by mouth daily.    . Multiple Vitamins-Minerals (PRESERVISION AREDS 2 PO) Take 1 tablet by mouth 2 (two) times daily with a meal.    . NON FORMULARY Eye In jection every 14 weeks. Both eyes are injected. Last injection was 02/23/2020.    . Omega-3 Fatty Acids (FISH OIL) 1200 MG CAPS Take 1,200 mg by mouth daily.    . ondansetron (ZOFRAN  ODT) 4 MG disintegrating tablet Take 1 tablet (4 mg total) by mouth every 8 (eight) hours as needed. 10 tablet 1  . potassium chloride (KLOR-CON) 10 MEQ tablet TAKE 1 TABLET BY MOUTH EVERY DAY 90 tablet 2  . warfarin (COUMADIN) 5 MG tablet Take 1 1/2 tablets daily except 1 tablet on Sundays, Tuesdays and Thursdays or as directed 40 tablet 6   No current facility-administered medications for this visit.    SURGICAL HISTORY:  Past Surgical History:  Procedure Laterality Date  . Bladder tack  1996  . CARDIOVERSION  11/02/09   x 2  . CATARACT EXTRACTION  2010/2011  . COLONOSCOPY  3 YRS AGO  . COLONOSCOPY  03/06/2011   Procedure: COLONOSCOPY;  Surgeon: Rogene Houston, MD;  [facility];  Service: Endoscopy;  Laterality: N/A;  7:30 am  . ESOPHAGOGASTRODUODENOSCOPY    . FLEXIBLE SIGMOIDOSCOPY N/A 10/04/2012   Procedure: FLEXIBLE SIGMOIDOSCOPY;  Surgeon: Rogene Houston, MD;  [facility];  Service: Endoscopy;  Laterality: N/A;  11:00-moved to 1015 Ann to notify pt  . THYROID SURGERY  1961  . TONSILLECTOMY    . VIDEO BRONCHOSCOPY  05/31/2011   Procedure: VIDEO BRONCHOSCOPY;  Surgeon: Grace Isaac, MD;  Location: [facility] OR;  Service: Thoracic;  Laterality: N/A;    REVIEW OF SYSTEMS:  A comprehensive review of systems was negative.   PHYSICAL EXAMINATION: General appearance: alert, cooperative and no distress Head: Normocephalic, without obvious abnormality, atraumatic Neck: no adenopathy, no JVD, supple, symmetrical, trachea midline and thyroid not enlarged, symmetric, no tenderness/mass/nodules Lymph nodes: Cervical, supraclavicular, and axillary nodes normal. Resp: clear to auscultation bilaterally Back: symmetric, no curvature. ROM normal. No CVA tenderness. Cardio: regular rate and rhythm, S1, S2 normal, no murmur, click, rub or gallop GI: soft, non-tender; bowel sounds normal; no masses,  no organomegaly Extremities: extremities normal, atraumatic, no cyanosis or  edema  ECOG PERFORMANCE STATUS: 0 - Asymptomatic  Blood pressure (!) 141/75, pulse 83, temperature 98.1 F (36.7 C), temperature source Tympanic, resp. rate 16, height _0  (1.6 m), weight 105 lb (47.6 kg), SpO2 97 %.  LABORATORY DATA: Lab Results  Component Value Date   WBC 3.9 (L) 05/24/2020   HGB 13.2 05/24/2020   HCT 40.8 05/24/2020   MCV 93.6 05/24/2020   PLT 223 05/24/2020      Chemistry      Component Value Date/Time   NA 137 03/18/2020 1027   NA 141 05/14/2017 1304   K 4.4 03/18/2020 1027   K 4.3 05/14/2017 1304   CL 99 03/18/2020 1027   CO2 30 03/18/2020 1027   CO2 30 (H) 05/14/2017 1304   BUN 19 03/18/2020 1027   BUN 19.0 05/14/2017 1304   CREATININE 0.68 03/18/2020 1027  CREATININE 0.92 01/26/2020 1300   CREATININE 0.8 05/14/2017 1304      Component Value Date/Time   CALCIUM 9.1 03/18/2020 1027   CALCIUM 9.1 05/14/2017 1304   ALKPHOS 106 03/18/2020 1027   ALKPHOS 86 05/14/2017 1304   AST 28 03/18/2020 1027   AST 30 01/26/2020 1300   AST 25 05/14/2017 1304   ALT 16 03/18/2020 1027   ALT 19 01/26/2020 1300   ALT 15 05/14/2017 1304   BILITOT 0.5 03/18/2020 1027   BILITOT 0.6 01/26/2020 1300   BILITOT 0.57 05/14/2017 1304       RADIOGRAPHIC STUDIES: No results found.  ASSESSMENT AND PLAN:  This is a very pleasant 86 years old white female with recurrent non-small cell lung cancer, adenocarcinoma with positive EGFR mutation. The patient has been on treatment with Iressa 250 mg p.o. daily for the last 61 months. The patient continues to tolerate this treatment well with no concerning adverse effects. I recommended for her to continue her current treatment with Iressa with the same dose. I will see her back for follow-up visit in 2 months for evaluation and repeat blood work. She was advised to call immediately if she has any concerning symptoms in the interval. The patient voices understanding of current disease status and treatment options and is  in agreement with the current care plan. All questions were answered. The patient knows to call the clinic with any problems, questions or concerns. We can certainly see the patient much sooner if necessary.  Disclaimer: This note was dictated with voice recognition software. Similar sounding words can inadvertently be transcribed and may not be corrected upon review.

## 2020-06-10 ENCOUNTER — Other Ambulatory Visit: Payer: Self-pay | Admitting: Cardiology

## 2020-06-16 DIAGNOSIS — H524 Presbyopia: Secondary | ICD-10-CM | POA: Diagnosis not present

## 2020-06-16 DIAGNOSIS — H353232 Exudative age-related macular degeneration, bilateral, with inactive choroidal neovascularization: Secondary | ICD-10-CM | POA: Diagnosis not present

## 2020-06-16 DIAGNOSIS — Z961 Presence of intraocular lens: Secondary | ICD-10-CM | POA: Diagnosis not present

## 2020-06-21 ENCOUNTER — Ambulatory Visit (INDEPENDENT_AMBULATORY_CARE_PROVIDER_SITE_OTHER): Payer: Medicare Other | Admitting: *Deleted

## 2020-06-21 ENCOUNTER — Telehealth: Payer: Self-pay | Admitting: *Deleted

## 2020-06-21 DIAGNOSIS — Z5181 Encounter for therapeutic drug level monitoring: Secondary | ICD-10-CM

## 2020-06-21 DIAGNOSIS — I4891 Unspecified atrial fibrillation: Secondary | ICD-10-CM | POA: Diagnosis not present

## 2020-06-21 LAB — POCT INR: INR: 3.8 — AB (ref 2.0–3.0)

## 2020-06-21 NOTE — Telephone Encounter (Signed)
New message   Patient called stated she is having nose bleeds since Saturday they are waking her up in the middle of the night, having multiple nose bleeds a day, thinks her blood is too thin and wants to know if she should hold her coumadin?

## 2020-06-21 NOTE — Telephone Encounter (Signed)
Spoke with patient.  Appointment made for her to come in today for INR check.

## 2020-06-21 NOTE — Patient Instructions (Signed)
Hold warfarin tonight, take 1/2 tablet tomorrow night then resume 1 1/2 tablets daily except 1 tablet on Sundays, Tuesdays and Thursdays Recheck in 2 weeks

## 2020-06-24 NOTE — Progress Notes (Unsigned)
Cardiology Office Note  Date: 06/25/2020   ID: CORAH WILLEFORD, DOB June 28, 1929, MRN [id number]  PCP:  [name], MD  Cardiologist:  Rozann Lesches, MD Electrophysiologist:  None   Chief Complaint: 1 year cardiac follow-up  History of Present Illness: Natalie Carlson is a 87 y.o. female with a history of atrial fibrillation, HLD, hypothyroidism, chronic diastolic heart failure.   Last encounter with Dr. Domenic Polite 04/01/2019.  He has been doing well without reports of palpitations or chest pain.  She was on Coumadin and following with Coumadin clinic.  She did not report any bleeding problems.  She was continuing Cartia XT 180 mg daily for heart rate control.  She was doing well without palpitation and had good heart rate control on cardia XT.  She was continuing Coumadin for stroke prophylaxis.  She was on omega-3 supplements for follow-up with Dr. Quillian Quince for her mixed hyperlipidemia.  This pleasant 87 year old female is here today for 1 year follow-up.  She states she has been doing very well except for an odd feeling in the left side of her neck.  She states she notices it when she lays down to sleep at night.  Otherwise she denies any recent issues.  She history of permanent atrial fibrillation with rate of 69 on EKG today.  She follows in the Coumadin clinic with Edrick Oh.  She states her INR was recently slightly above 3 and had to be adjusted.  She denies any significant palpitations or arrhythmias.  Denies any orthostatic symptoms, CVA or TIA-like symptoms, denies any PND, orthopnea.  States she had some minor nosebleeding prior to her recent INR check.  States the nose bleeding has stopped.  Denies any claudication-like symptoms, DVT or PE-like symptoms.  She does have some chronic lower extremity edema for which she takes Lasix.  She states she has some chronic back issues secondary to scoliosis and arthritis.   Past Medical History:  Diagnosis Date  . Arthritis   . Atrial  fibrillation ([place])   . Cataract of both eyes   . Chronic back pain   . Chronic diarrhea   . Colitis   . Hemorrhoids   . History of colon polyps   . History of migraines   . Hyperlipidemia   . Hypothyroidism   . Macular degeneration, dry   . Malignant neoplasm of right upper lobe of lung (HCC)    Stage 1A  EGFR positive  Deletion in exon 19.  ALK-negative  Right Upper Lobe Lung Mass  SUV 2.9 on PET Status post VATS and right upper lobe lobectomy 05/31/2011  . Mucus in stool   . Nephrolithiasis    1994 - passed on its on    Past Surgical History:  Procedure Laterality Date  . Bladder tack  1996  . CARDIOVERSION  11/02/09   x 2  . CATARACT EXTRACTION  2010/2011  . COLONOSCOPY  3 YRS AGO  . COLONOSCOPY  03/06/2011   Procedure: COLONOSCOPY;  Surgeon: Rogene Houston, MD;  [facility]: [facility];  Service: Endoscopy;  Laterality: N/A;  7:30 am  . ESOPHAGOGASTRODUODENOSCOPY    . FLEXIBLE SIGMOIDOSCOPY N/A 10/04/2012   Procedure: FLEXIBLE SIGMOIDOSCOPY;  Surgeon: Rogene Houston, MD;  Location: [facility];  Service: Endoscopy;  Laterality: N/A;  11:00-moved to 1015 Ann to notify pt  . THYROID SURGERY  1961  . TONSILLECTOMY    . VIDEO BRONCHOSCOPY  05/31/2011   Procedure: VIDEO BRONCHOSCOPY;  Surgeon: Grace Isaac,  MD;  Location: [facility] OR;  Service: Thoracic;  Laterality: N/A;    Current Outpatient Medications  Medication Sig Dispense Refill  . acetaminophen (TYLENOL) 500 MG tablet Take 500 mg by mouth. Patient takes as needed , which is seldom.    . Calcium Carbonate (CALCIUM 600 PO) Take 1 tablet by mouth 2 (two) times daily.    . diclofenac sodium (VOLTAREN) 1 % GEL Apply 1 application topically 2 (two) times daily as needed (for neck pain).     Marland Kitchen diltiazem (CARDIZEM CD) 180 MG 24 hr capsule TAKE ONE CAPSULE BY MOUTH EVERY DAY 90 capsule 2  . furosemide (LASIX) 20 MG tablet TAKE 1 TABLET BY MOUTH EVERY DAY AS NEEDED FOR FLUID 30 tablet 1  . gefitinib (IRESSA) 250 MG tablet  Take 1 tablet (250 mg total) by mouth daily. 30 tablet 5  . HYDROmorphone (DILAUDID) 4 MG tablet Take 1 tablet (4 mg total) by mouth every 6 (six) hours as needed for severe pain. 20 tablet 0  . loperamide (IMODIUM A-D) 2 MG tablet Take 1 mg by mouth daily as needed for diarrhea or loose stools.    . LUTEIN PO Take 1 capsule by mouth daily. Reported on 10/27/2015    . magic mouthwash SOLN Take 5 mLs by mouth 4 (four) times daily as needed for mouth pain. 240 mL 0  . magnesium 30 MG tablet Take 30 mg by mouth at bedtime.     . mesalamine (APRISO) 0.375 g 24 hr capsule TAKE 2 CAPSULE BY MOUTH TWICE DAILY 120 capsule 11  . Multiple Vitamins-Minerals (AIRBORNE) CHEW Chew 1 tablet by mouth daily.    . Multiple Vitamins-Minerals (HAIR/SKIN/NAILS) TABS Take 1 tablet by mouth daily.    . Multiple Vitamins-Minerals (PRESERVISION AREDS 2 PO) Take 1 tablet by mouth 2 (two) times daily with a meal.    . NON FORMULARY Eye In jection every 14 weeks. Both eyes are injected. Last injection was 02/23/2020.    . Omega-3 Fatty Acids (FISH OIL) 1200 MG CAPS Take 1,200 mg by mouth daily.    . ondansetron (ZOFRAN ODT) 4 MG disintegrating tablet Take 1 tablet (4 mg total) by mouth every 8 (eight) hours as needed. 10 tablet 1  . potassium chloride (KLOR-CON) 10 MEQ tablet TAKE 1 TABLET BY MOUTH EVERY DAY 30 tablet 1  . warfarin (COUMADIN) 5 MG tablet Take 1 1/2 tablets daily except 1 tablet on Sundays, Tuesdays and Thursdays or as directed 40 tablet 6   No current facility-administered medications for this visit.   Allergies:  Doxycycline, Entocort ec [budesonide], Neurontin [gabapentin], Vicodin [hydrocodone-acetaminophen], Adhesive [tape], Amoxicillin, Chocolate, Codeine, Levaquin [levofloxacin in d5w], Morphine and related, Naprosyn [naproxen], Penicillins, Prednisone, Tramadol, and Uncoded nonscreenable allergen   Social History: The patient  reports that she has never smoked. She has never used smokeless tobacco.  She reports that she does not drink alcohol and does not use drugs.   Family History: The patient's family history includes Anesthesia problems in her sister; Heart disease in an other family member; Liver disease in her paternal grandmother; Stroke in her father, mother, and another family member.   ROS:  Please see the history of present illness. Otherwise, complete review of systems is positive for none.  All other systems are reviewed and negative.   Physical Exam: VS:  BP 128/82   Pulse (!) 45   Ht _0  (1.6 m)   Wt 102 lb 9.6 oz (46.5 kg)   SpO2 96%  BMI 18.17 kg/m , BMI Body mass index is 18.17 kg/m.  Wt Readings from Last 3 Encounters:  06/25/20 102 lb 9.6 oz (46.5 kg)  05/24/20 105 lb (47.6 kg)  02/24/20 101 lb 3.2 oz (45.9 kg)    General: Patient appears comfortable at rest. Neck: Supple, no elevated JVP or carotid bruits, no thyromegaly. Lungs: Clear to auscultation, nonlabored breathing at rest. Cardiac: Irregularly irregular and rhythm, no S3 or significant systolic murmur, no pericardial rub. Extremities: No pitting edema, distal pulses 2+. Skin: Warm and dry. Musculoskeletal: No kyphosis. Neuropsychiatric: Alert and oriented x3, affect grossly appropriate.  ECG:  An ECG dated 06/25/2020 was personally reviewed today and demonstrated:  Atrial fibrillation with rate of 69.  Septal infarct, age undetermined, lateral infarct, age undetermined.  Recent Labwork: 05/24/2020: ALT 16; AST 28; BUN 18; Creatinine 0.81; Hemoglobin 13.2; Platelet Count 223; Potassium 4.5; Sodium 141  No results found for: CHOL, TRIG, HDL, CHOLHDL, VLDL, LDLCALC, LDLDIRECT  Other Studies Reviewed Today:  Echocardiogram 02/04/2014: Study Conclusions  - Left ventricle: The cavity size was normal. Wall thickness was normal. Systolic function was normal. The estimated ejection fraction was in the range of 60% to 65%. Wall motion was normal; there were no regional wall motion  abnormalities. The study is not technically sufficient to allow evaluation of LV diastolic function. - Aortic valve: Mildly calcified annulus. Trileaflet. There was trivial regurgitation. - Mitral valve: Calcified annulus. There was mild regurgitation. - Right ventricle: The cavity size was mildly to moderately dilated. Systolic function was normal. - Right atrium: The atrium was mildly dilated. Central venous pressure (est): 3 mm Hg. - Atrial septum: No defect or patent foramen ovale was identified. - Tricuspid valve: There was mild-moderate regurgitation. - Pulmonary arteries: PA peak pressure: 34 mm Hg (S). - Pericardium, extracardiac: There was no pericardial effusion.  Impressions:  - Normal LV wall thickness and chamber size with LVEF 60-65%. Indeterminate diastolic function in the setting of coarse atrial fibrillation/flutter. MAC with mild mitral regurgitation. Mild to moderate RV enlargement with normal contraction. Mild right atrial enlargement. Mild to moderate tricuspid regurgitation with PASP 34 mmHg.   Assessment and Plan:  1. Permanent atrial fibrillation ([place])   2. Mixed hyperlipidemia   3. Chronic diastolic heart failure ([place])    1. Permanent atrial fibrillation ([place]) Remains in atrial fibrillation with EKG showing atrial fibrillation with a rate of 69 today.  Continue diltiazem CD 180 mg daily.  Continue Coumadin as directed by Coumadin clinic.  2. Mixed hyperlipidemia Continue omega-3 fatty acids 1200 mg by mouth daily.  3.  Chronic diastolic heart failure Denies any weight gain, shortness of breath.  Does have some chronic lower extremity edema.  Continue 20 mg by mouth every other day as needed for fluid.  Continue potassium supplementation 10 mEq every other day.  Medication Adjustments/Labs and Tests Ordered: Current medicines are reviewed at length with the patient today.  Concerns regarding medicines are outlined above.     Disposition: Follow-up with Dr. Domenic Polite or [facility] 1 year.   Signed, Levell July, NP 06/25/2020 3:29 PM    [facility] at [place], [place], [place] [address] Phone: [phone]; Fax: [phone]

## 2020-06-25 ENCOUNTER — Encounter: Payer: Self-pay | Admitting: Family Medicine

## 2020-06-25 ENCOUNTER — Ambulatory Visit (INDEPENDENT_AMBULATORY_CARE_PROVIDER_SITE_OTHER): Payer: Medicare Other | Admitting: Family Medicine

## 2020-06-25 VITALS — BP 128/82 | HR 45 | Ht 63.0 in | Wt 102.6 lb

## 2020-06-25 DIAGNOSIS — I5032 Chronic diastolic (congestive) heart failure: Secondary | ICD-10-CM | POA: Diagnosis not present

## 2020-06-25 DIAGNOSIS — I4821 Permanent atrial fibrillation: Secondary | ICD-10-CM | POA: Diagnosis not present

## 2020-06-25 DIAGNOSIS — E782 Mixed hyperlipidemia: Secondary | ICD-10-CM

## 2020-06-25 NOTE — Patient Instructions (Signed)
Medication Instructions:  Continue all current medications.  Labwork: none  Testing/Procedures: noine  Follow-Up: Your physician wants you to follow up in:  1 year.  You will receive a reminder letter in the mail one-two months in advance.  If you don't receive a letter, please call our office to schedule the follow up appointment.    Any Other Special Instructions Will Be Listed Below (If Applicable).  If you need a refill on your cardiac medications before your next appointment, please call your pharmacy.

## 2020-06-28 DIAGNOSIS — H35371 Puckering of macula, right eye: Secondary | ICD-10-CM | POA: Diagnosis not present

## 2020-06-28 DIAGNOSIS — H43391 Other vitreous opacities, right eye: Secondary | ICD-10-CM | POA: Diagnosis not present

## 2020-06-28 DIAGNOSIS — H43813 Vitreous degeneration, bilateral: Secondary | ICD-10-CM | POA: Diagnosis not present

## 2020-06-28 DIAGNOSIS — H353231 Exudative age-related macular degeneration, bilateral, with active choroidal neovascularization: Secondary | ICD-10-CM | POA: Diagnosis not present

## 2020-07-07 DIAGNOSIS — I776 Arteritis, unspecified: Secondary | ICD-10-CM | POA: Diagnosis not present

## 2020-07-07 DIAGNOSIS — L932 Other local lupus erythematosus: Secondary | ICD-10-CM | POA: Diagnosis not present

## 2020-07-07 DIAGNOSIS — Z681 Body mass index (BMI) 19 or less, adult: Secondary | ICD-10-CM | POA: Diagnosis not present

## 2020-07-08 ENCOUNTER — Ambulatory Visit (INDEPENDENT_AMBULATORY_CARE_PROVIDER_SITE_OTHER): Payer: Medicare Other | Admitting: *Deleted

## 2020-07-08 DIAGNOSIS — Z5181 Encounter for therapeutic drug level monitoring: Secondary | ICD-10-CM

## 2020-07-08 DIAGNOSIS — I4891 Unspecified atrial fibrillation: Secondary | ICD-10-CM | POA: Diagnosis not present

## 2020-07-08 LAB — POCT INR: INR: 4.2 — AB (ref 2.0–3.0)

## 2020-07-08 NOTE — Patient Instructions (Signed)
Hold warfarin tonight, take 1/2 tablet tomorrow night then decrease dose to 1 tablet daily while on prednisone taper.  Will finish 07/18/20 Recheck in 1 week

## 2020-07-15 ENCOUNTER — Ambulatory Visit (INDEPENDENT_AMBULATORY_CARE_PROVIDER_SITE_OTHER): Payer: Medicare Other | Admitting: *Deleted

## 2020-07-15 DIAGNOSIS — Z5181 Encounter for therapeutic drug level monitoring: Secondary | ICD-10-CM | POA: Diagnosis not present

## 2020-07-15 DIAGNOSIS — I4891 Unspecified atrial fibrillation: Secondary | ICD-10-CM | POA: Diagnosis not present

## 2020-07-15 LAB — POCT INR: INR: 4 — AB (ref 2.0–3.0)

## 2020-07-15 NOTE — Patient Instructions (Signed)
Hold warfarin tonight, take 1/2 tablet tomorrow night then decrease dose to 1 tablet daily while on prednisone taper.  Will finish 07/18/20 Recheck in 1 week

## 2020-07-19 DIAGNOSIS — Z20828 Contact with and (suspected) exposure to other viral communicable diseases: Secondary | ICD-10-CM | POA: Diagnosis not present

## 2020-07-19 DIAGNOSIS — M79672 Pain in left foot: Secondary | ICD-10-CM | POA: Diagnosis not present

## 2020-07-19 DIAGNOSIS — R531 Weakness: Secondary | ICD-10-CM | POA: Diagnosis not present

## 2020-07-19 DIAGNOSIS — K529 Noninfective gastroenteritis and colitis, unspecified: Secondary | ICD-10-CM | POA: Diagnosis not present

## 2020-07-22 ENCOUNTER — Ambulatory Visit (INDEPENDENT_AMBULATORY_CARE_PROVIDER_SITE_OTHER): Payer: Medicare Other | Admitting: *Deleted

## 2020-07-22 DIAGNOSIS — Z5181 Encounter for therapeutic drug level monitoring: Secondary | ICD-10-CM | POA: Diagnosis not present

## 2020-07-22 DIAGNOSIS — I4891 Unspecified atrial fibrillation: Secondary | ICD-10-CM | POA: Diagnosis not present

## 2020-07-22 LAB — POCT INR: INR: 7 — AB (ref 2.0–3.0)

## 2020-07-22 NOTE — Patient Instructions (Addendum)
Pt refused to go to lab for PT/INR Hold warfarin and come back for INR check on Monday.   Was extremely sick with stomach flu Monday.  N&V  Took 6 doses of Pepto Denies any s/s of bleeding.  Has left over pill of Vit K from the past but will hold off unless pt develops symptoms. Recheck on Monday

## 2020-07-25 ENCOUNTER — Other Ambulatory Visit: Payer: Self-pay | Admitting: Cardiology

## 2020-07-26 ENCOUNTER — Ambulatory Visit (INDEPENDENT_AMBULATORY_CARE_PROVIDER_SITE_OTHER): Payer: Medicare Other | Admitting: *Deleted

## 2020-07-26 ENCOUNTER — Other Ambulatory Visit: Payer: Self-pay

## 2020-07-26 DIAGNOSIS — Z5181 Encounter for therapeutic drug level monitoring: Secondary | ICD-10-CM | POA: Diagnosis not present

## 2020-07-26 DIAGNOSIS — I4891 Unspecified atrial fibrillation: Secondary | ICD-10-CM | POA: Diagnosis not present

## 2020-07-26 LAB — POCT INR: INR: 2.1 (ref 2.0–3.0)

## 2020-07-26 NOTE — Patient Instructions (Signed)
Start warfarin 1 tablet daily except 1 1/2 tablet on Mondays Recheck in 10 days

## 2020-08-02 ENCOUNTER — Inpatient Hospital Stay: Payer: Medicare Other | Attending: Internal Medicine

## 2020-08-02 ENCOUNTER — Inpatient Hospital Stay (HOSPITAL_BASED_OUTPATIENT_CLINIC_OR_DEPARTMENT_OTHER): Payer: Medicare Other | Admitting: Internal Medicine

## 2020-08-02 ENCOUNTER — Other Ambulatory Visit: Payer: Self-pay

## 2020-08-02 VITALS — BP 122/72 | HR 91 | Temp 97.2°F | Resp 18 | Ht 63.0 in | Wt 101.0 lb

## 2020-08-02 DIAGNOSIS — C3411 Malignant neoplasm of upper lobe, right bronchus or lung: Secondary | ICD-10-CM | POA: Insufficient documentation

## 2020-08-02 DIAGNOSIS — I4891 Unspecified atrial fibrillation: Secondary | ICD-10-CM | POA: Diagnosis not present

## 2020-08-02 DIAGNOSIS — Z880 Allergy status to penicillin: Secondary | ICD-10-CM | POA: Diagnosis not present

## 2020-08-02 DIAGNOSIS — R197 Diarrhea, unspecified: Secondary | ICD-10-CM | POA: Insufficient documentation

## 2020-08-02 DIAGNOSIS — Z888 Allergy status to other drugs, medicaments and biological substances status: Secondary | ICD-10-CM | POA: Insufficient documentation

## 2020-08-02 DIAGNOSIS — Z885 Allergy status to narcotic agent status: Secondary | ICD-10-CM | POA: Diagnosis not present

## 2020-08-02 DIAGNOSIS — Z881 Allergy status to other antibiotic agents status: Secondary | ICD-10-CM | POA: Diagnosis not present

## 2020-08-02 DIAGNOSIS — Z886 Allergy status to analgesic agent status: Secondary | ICD-10-CM | POA: Insufficient documentation

## 2020-08-02 DIAGNOSIS — Z79899 Other long term (current) drug therapy: Secondary | ICD-10-CM | POA: Diagnosis not present

## 2020-08-02 DIAGNOSIS — Z7901 Long term (current) use of anticoagulants: Secondary | ICD-10-CM | POA: Insufficient documentation

## 2020-08-02 DIAGNOSIS — Z8719 Personal history of other diseases of the digestive system: Secondary | ICD-10-CM | POA: Insufficient documentation

## 2020-08-02 DIAGNOSIS — R1110 Vomiting, unspecified: Secondary | ICD-10-CM | POA: Insufficient documentation

## 2020-08-02 DIAGNOSIS — C3490 Malignant neoplasm of unspecified part of unspecified bronchus or lung: Secondary | ICD-10-CM | POA: Diagnosis not present

## 2020-08-02 DIAGNOSIS — Z5181 Encounter for therapeutic drug level monitoring: Secondary | ICD-10-CM | POA: Diagnosis not present

## 2020-08-02 LAB — CBC WITH DIFFERENTIAL (CANCER CENTER ONLY)
Abs Immature Granulocytes: 0.01 10*3/uL (ref 0.00–0.07)
Basophils Absolute: 0 10*3/uL (ref 0.0–0.1)
Basophils Relative: 1 %
Eosinophils Absolute: 0.1 10*3/uL (ref 0.0–0.5)
Eosinophils Relative: 3 %
HCT: 43.7 % (ref 36.0–46.0)
Hemoglobin: 14.1 g/dL (ref 12.0–15.0)
Immature Granulocytes: 0 %
Lymphocytes Relative: 26 %
Lymphs Abs: 1 10*3/uL (ref 0.7–4.0)
MCH: 30.2 pg (ref 26.0–34.0)
MCHC: 32.3 g/dL (ref 30.0–36.0)
MCV: 93.6 fL (ref 80.0–100.0)
Monocytes Absolute: 0.4 10*3/uL (ref 0.1–1.0)
Monocytes Relative: 11 %
Neutro Abs: 2.2 10*3/uL (ref 1.7–7.7)
Neutrophils Relative %: 59 %
Platelet Count: 242 10*3/uL (ref 150–400)
RBC: 4.67 MIL/uL (ref 3.87–5.11)
RDW: 14.3 % (ref 11.5–15.5)
WBC Count: 3.8 10*3/uL — ABNORMAL LOW (ref 4.0–10.5)
nRBC: 0 % (ref 0.0–0.2)

## 2020-08-02 LAB — CMP (CANCER CENTER ONLY)
ALT: 17 U/L (ref 0–44)
AST: 29 U/L (ref 15–41)
Albumin: 3.7 g/dL (ref 3.5–5.0)
Alkaline Phosphatase: 81 U/L (ref 38–126)
Anion gap: 8 (ref 5–15)
BUN: 15 mg/dL (ref 8–23)
CO2: 30 mmol/L (ref 22–32)
Calcium: 9.2 mg/dL (ref 8.9–10.3)
Chloride: 104 mmol/L (ref 98–111)
Creatinine: 1.01 mg/dL — ABNORMAL HIGH (ref 0.44–1.00)
GFR, Estimated: 54 mL/min — ABNORMAL LOW (ref >60)
Glucose, Bld: 108 mg/dL — ABNORMAL HIGH (ref 70–99)
Potassium: 4 mmol/L (ref 3.5–5.1)
Sodium: 142 mmol/L (ref 135–145)
Total Bilirubin: 0.5 mg/dL (ref 0.3–1.2)
Total Protein: 7.2 g/dL (ref 6.5–8.1)

## 2020-08-02 NOTE — Progress Notes (Signed)
[name] Telephone:([phone]   Fax:([phone]  OFFICE PROGRESS NOTE  [name], MD [place] [address]  DIAGNOSIS: Recurrent non-small cell lung cancer presented with bilateral lung nodules in October 2016 initially diagnosed as a stage IA adenocarcinoma with positive EGFR mutation with deletion in exon 56 in December 2012.  PRIOR THERAPY: Status post bronchoscopy with right VATS and right upper lobectomy with lymph node dissection under the care of Dr. Servando Snare on 05/31/2011.  CURRENT THERAPY: Iressa 250 mg by mouth daily started 04/17/2015, status post 63 months of treatment.  INTERVAL HISTORY: Natalie Carlson 87 y.o. female returns to the clinic today for follow-up visit.  The patient is feeling fine today with no concerning complaints.  She denied having any chest pain, shortness of breath, cough or hemoptysis.  She denied having any fever or chills.  She has no nausea, vomiting, diarrhea or constipation.  She had few episodes of sickness in the last 2 months and she was diagnosed with vasculitis and treated with a course of prednisone.  She also had stomach bug with diarrhea and vomiting and her INR significantly increased to 7.0.  She was treated with vitamin K and her last INR was down to 2.1.  The patient is feeling much better now.  She continues to tolerate her treatment with Iressa fairly well.  She is here today for evaluation and repeat blood work.  MEDICAL HISTORY: Past Medical History:  Diagnosis Date  . Arthritis   . Atrial fibrillation ([place])   . Cataract of both eyes   . Chronic back pain   . Chronic diarrhea   . Colitis   . Hemorrhoids   . History of colon polyps   . History of migraines   . Hyperlipidemia   . Hypothyroidism   . Macular degeneration, dry   . Malignant neoplasm of right upper lobe of lung (HCC)    Stage 1A  EGFR positive  Deletion in exon 19.  ALK-negative  Right Upper Lobe Lung Mass  SUV 2.9 on PET  Status post VATS and right upper lobe lobectomy 05/31/2011  . Mucus in stool   . Nephrolithiasis    1994 - passed on its on    ALLERGIES:  is allergic to doxycycline, entocort ec [budesonide], neurontin [gabapentin], vicodin [hydrocodone-acetaminophen], adhesive [tape], amoxicillin, chocolate, codeine, levaquin [levofloxacin in d5w], morphine and related, naprosyn [naproxen], penicillins, prednisone, tramadol, and uncoded nonscreenable allergen.  MEDICATIONS:  Current Outpatient Medications  Medication Sig Dispense Refill  . acetaminophen (TYLENOL) 500 MG tablet Take 500 mg by mouth. Patient takes as needed , which is seldom.    . Calcium Carbonate (CALCIUM 600 PO) Take 1 tablet by mouth 2 (two) times daily.    . diclofenac sodium (VOLTAREN) 1 % GEL Apply 1 application topically 2 (two) times daily as needed (for neck pain).     Marland Kitchen diltiazem (CARDIZEM CD) 180 MG 24 hr capsule TAKE ONE CAPSULE BY MOUTH EVERY DAY 90 capsule 2  . furosemide (LASIX) 20 MG tablet TAKE 1 TABLET BY MOUTH EVERY DAY AS NEEDED FOR FLUID 30 tablet 1  . gefitinib (IRESSA) 250 MG tablet Take 1 tablet (250 mg total) by mouth daily. 30 tablet 5  . HYDROmorphone (DILAUDID) 4 MG tablet Take 1 tablet (4 mg total) by mouth every 6 (six) hours as needed for severe pain. 20 tablet 0  . loperamide (IMODIUM A-D) 2 MG tablet Take 1 mg by mouth  daily as needed for diarrhea or loose stools.    . LUTEIN PO Take 1 capsule by mouth daily. Reported on 10/27/2015    . magic mouthwash SOLN Take 5 mLs by mouth 4 (four) times daily as needed for mouth pain. 240 mL 0  . magnesium 30 MG tablet Take 30 mg by mouth at bedtime.     . mesalamine (APRISO) 0.375 g 24 hr capsule TAKE 2 CAPSULE BY MOUTH TWICE DAILY 120 capsule 11  . Multiple Vitamins-Minerals (AIRBORNE) CHEW Chew 1 tablet by mouth daily.    . Multiple Vitamins-Minerals (HAIR/SKIN/NAILS) TABS Take 1 tablet by mouth daily.    . Multiple Vitamins-Minerals (PRESERVISION AREDS 2 PO) Take 1  tablet by mouth 2 (two) times daily with a meal.    . NON FORMULARY Eye In jection every 14 weeks. Both eyes are injected. Last injection was 02/23/2020.    . Omega-3 Fatty Acids (FISH OIL) 1200 MG CAPS Take 1,200 mg by mouth daily.    . ondansetron (ZOFRAN ODT) 4 MG disintegrating tablet Take 1 tablet (4 mg total) by mouth every 8 (eight) hours as needed. 10 tablet 1  . potassium chloride (KLOR-CON) 10 MEQ tablet TAKE 1 TABLET BY MOUTH EVERY DAY 30 tablet 1  . warfarin (COUMADIN) 5 MG tablet Take 1 1/2 tablets daily except 1 tablet on Sundays, Tuesdays and Thursdays or as directed 40 tablet 6   No current facility-administered medications for this visit.    SURGICAL HISTORY:  Past Surgical History:  Procedure Laterality Date  . Bladder tack  1996  . CARDIOVERSION  11/02/09   x 2  . CATARACT EXTRACTION  2010/2011  . COLONOSCOPY  3 YRS AGO  . COLONOSCOPY  03/06/2011   Procedure: COLONOSCOPY;  Surgeon: Rogene Houston, MD;  [facility];  Service: Endoscopy;  Laterality: N/A;  7:30 am  . ESOPHAGOGASTRODUODENOSCOPY    . FLEXIBLE SIGMOIDOSCOPY N/A 10/04/2012   Procedure: FLEXIBLE SIGMOIDOSCOPY;  Surgeon: Rogene Houston, MD;  [facility];  Service: Endoscopy;  Laterality: N/A;  11:00-moved to 1015 Ann to notify pt  . THYROID SURGERY  1961  . TONSILLECTOMY    . VIDEO BRONCHOSCOPY  05/31/2011   Procedure: VIDEO BRONCHOSCOPY;  Surgeon: Grace Isaac, MD;  Location: [facility] OR;  Service: Thoracic;  Laterality: N/A;    REVIEW OF SYSTEMS:  A comprehensive review of systems was negative.   PHYSICAL EXAMINATION: General appearance: alert, cooperative and no distress Head: Normocephalic, without obvious abnormality, atraumatic Neck: no adenopathy, no JVD, supple, symmetrical, trachea midline and thyroid not enlarged, symmetric, no tenderness/mass/nodules Lymph nodes: Cervical, supraclavicular, and axillary nodes normal. Resp: clear to auscultation bilaterally Back:  symmetric, no curvature. ROM normal. No CVA tenderness. Cardio: regular rate and rhythm, S1, S2 normal, no murmur, click, rub or gallop GI: soft, non-tender; bowel sounds normal; no masses,  no organomegaly Extremities: extremities normal, atraumatic, no cyanosis or edema  ECOG PERFORMANCE STATUS: 0 - Asymptomatic  Blood pressure 122/72, pulse 91, temperature (!) 97.2 F (36.2 C), temperature source Tympanic, resp. rate 18, height _0  (1.6 m), weight 101 lb (45.8 kg), SpO2 98 %.  LABORATORY DATA: Lab Results  Component Value Date   WBC 3.8 (L) 08/02/2020   HGB 14.1 08/02/2020   HCT 43.7 08/02/2020   MCV 93.6 08/02/2020   PLT 242 08/02/2020      Chemistry      Component Value Date/Time   NA 141 05/24/2020 1102   NA 141 05/14/2017  1304   K 4.5 05/24/2020 1102   K 4.3 05/14/2017 1304   CL 105 05/24/2020 1102   CO2 28 05/24/2020 1102   CO2 30 (H) 05/14/2017 1304   BUN 18 05/24/2020 1102   BUN 19.0 05/14/2017 1304   CREATININE 0.81 05/24/2020 1102   CREATININE 0.8 05/14/2017 1304      Component Value Date/Time   CALCIUM 9.3 05/24/2020 1102   CALCIUM 9.1 05/14/2017 1304   ALKPHOS 91 05/24/2020 1102   ALKPHOS 86 05/14/2017 1304   AST 28 05/24/2020 1102   AST 25 05/14/2017 1304   ALT 16 05/24/2020 1102   ALT 15 05/14/2017 1304   BILITOT 0.6 05/24/2020 1102   BILITOT 0.57 05/14/2017 1304       RADIOGRAPHIC STUDIES: No results found.  ASSESSMENT AND PLAN:  This is a very pleasant 87 years old white female with recurrent non-small cell lung cancer, adenocarcinoma with positive EGFR mutation. The patient has been on treatment with Iressa 250 mg p.o. daily for the last 63 months. The patient continues to tolerate her treatment well with no concerning adverse effects. I recommended for her to stay on treatment with Iressa 250 mg p.o. daily. I will see her back for follow-up visit in 2 months for evaluation and repeat CT scan of the chest for restaging of her  disease. She was advised to call immediately if she has any concerning symptoms in the interval. The patient voices understanding of current disease status and treatment options and is in agreement with the current care plan. All questions were answered. The patient knows to call the clinic with any problems, questions or concerns. We can certainly see the patient much sooner if necessary.  Disclaimer: This note was dictated with voice recognition software. Similar sounding words can inadvertently be transcribed and may not be corrected upon review.

## 2020-08-03 ENCOUNTER — Other Ambulatory Visit: Payer: Self-pay | Admitting: Internal Medicine

## 2020-08-03 ENCOUNTER — Other Ambulatory Visit: Payer: Self-pay | Admitting: Medical Oncology

## 2020-08-03 ENCOUNTER — Telehealth: Payer: Self-pay | Admitting: Internal Medicine

## 2020-08-03 DIAGNOSIS — C3490 Malignant neoplasm of unspecified part of unspecified bronchus or lung: Secondary | ICD-10-CM

## 2020-08-03 MED ORDER — IRESSA 250 MG PO TABS: 250.0000 mg | ORAL_TABLET | Freq: Every day | ORAL | 5 refills | Status: DC

## 2020-08-03 NOTE — Telephone Encounter (Signed)
Send iressa refills to speciality pharmacy [facility] does not dispense this drug.

## 2020-08-03 NOTE — Telephone Encounter (Signed)
Scheduled per 03/21 los, patient has been called and notified.

## 2020-08-05 ENCOUNTER — Ambulatory Visit (INDEPENDENT_AMBULATORY_CARE_PROVIDER_SITE_OTHER): Payer: Medicare Other | Admitting: Pharmacist

## 2020-08-05 DIAGNOSIS — Z5181 Encounter for therapeutic drug level monitoring: Secondary | ICD-10-CM

## 2020-08-05 DIAGNOSIS — I4891 Unspecified atrial fibrillation: Secondary | ICD-10-CM

## 2020-08-05 LAB — POCT INR: INR: 4.6 — AB (ref 2.0–3.0)

## 2020-08-05 NOTE — Patient Instructions (Signed)
Description   Hold dose today and tomorrow then continue warfarin 1 tablet daily except 1 1/2 tablet on Mondays

## 2020-08-09 DIAGNOSIS — M109 Gout, unspecified: Secondary | ICD-10-CM | POA: Diagnosis not present

## 2020-08-09 DIAGNOSIS — Z681 Body mass index (BMI) 19 or less, adult: Secondary | ICD-10-CM | POA: Diagnosis not present

## 2020-08-10 ENCOUNTER — Telehealth: Payer: Self-pay | Admitting: *Deleted

## 2020-08-10 NOTE — Telephone Encounter (Signed)
Spoke with pt.  She had cortisone shot yesterday in ankle for gout and was started on colchicine.  Questioned interactions.  Informed steroid shot s=could increase INR but colchicine should be fine.  Pt is pain free now and plans to stop colchicine today.  Will eat extra greens and keep INR appt for 08/17/20.

## 2020-08-10 NOTE — Telephone Encounter (Signed)
Pt has a question regarding a new medication she's going to be taking

## 2020-08-17 ENCOUNTER — Other Ambulatory Visit: Payer: Self-pay

## 2020-08-17 ENCOUNTER — Ambulatory Visit (INDEPENDENT_AMBULATORY_CARE_PROVIDER_SITE_OTHER): Payer: Medicare Other | Admitting: *Deleted

## 2020-08-17 DIAGNOSIS — I4891 Unspecified atrial fibrillation: Secondary | ICD-10-CM

## 2020-08-17 DIAGNOSIS — Z5181 Encounter for therapeutic drug level monitoring: Secondary | ICD-10-CM

## 2020-08-17 LAB — POCT INR: INR: 3.5 — AB (ref 2.0–3.0)

## 2020-08-17 NOTE — Patient Instructions (Signed)
Hold warfarin today then decrease dose to 1 tablet daily Recheck in 2 wks

## 2020-08-24 ENCOUNTER — Ambulatory Visit (HOSPITAL_COMMUNITY)
Admission: RE | Admit: 2020-08-24 | Discharge: 2020-08-24 | Disposition: A | Discharge status: Home / Self Care | Payer: Medicare Other | Source: Ambulatory Visit | Attending: Family Medicine | Admitting: Family Medicine

## 2020-08-24 ENCOUNTER — Telehealth: Payer: Self-pay | Admitting: Cardiology

## 2020-08-24 ENCOUNTER — Other Ambulatory Visit: Payer: Self-pay

## 2020-08-24 ENCOUNTER — Other Ambulatory Visit (HOSPITAL_COMMUNITY): Payer: Self-pay | Admitting: Family Medicine

## 2020-08-24 DIAGNOSIS — M7989 Other specified soft tissue disorders: Secondary | ICD-10-CM | POA: Diagnosis not present

## 2020-08-24 DIAGNOSIS — M79605 Pain in left leg: Secondary | ICD-10-CM | POA: Diagnosis not present

## 2020-08-24 NOTE — Telephone Encounter (Signed)
Spoke with patient.  She had a steroid injection in her hip today. After reviewing chart, told pt to continue current coumadin dose.  It was decreased at last INR check and she is due for a repeat test next week.  Told pt to eat extra Vit K food the next several days.  She verbalized understanding.

## 2020-08-24 NOTE — Telephone Encounter (Signed)
New message    Patient received a steroid shot today , she has questions about adjusting her coumadin

## 2020-08-31 IMAGING — CT CT CHEST W/ CM
2 of 3 series · 15 of 36 positions shown, 18 images · IV contrast (omnipaque)
Comparison: 09/12/2017

CLINICAL DATA: Followup lung cancer

EXAM:
CT CHEST WITH CONTRAST
TECHNIQUE: Multidetector CT imaging of the chest was performed during
intravenous contrast administration.
CONTRAST:  75mL OMNIPAQUE IOHEXOL 300 MG/ML  SOLN

[Series 2: axial st · axial · 0.56mm/px · z∈[-293,-15]mm · 12 of 165 slices shown, 15 images]
[im 13/165  mediastinal]
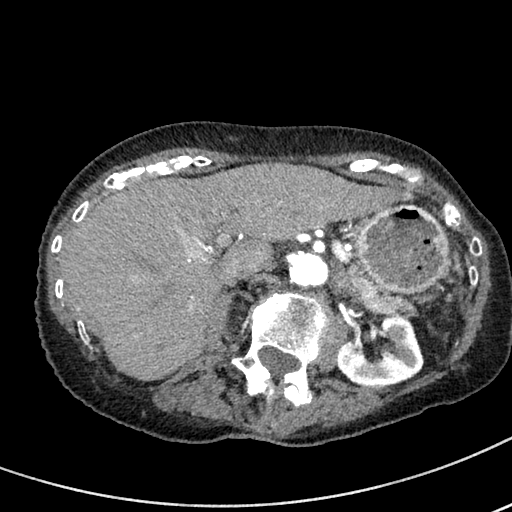
[im 13/165  lung]
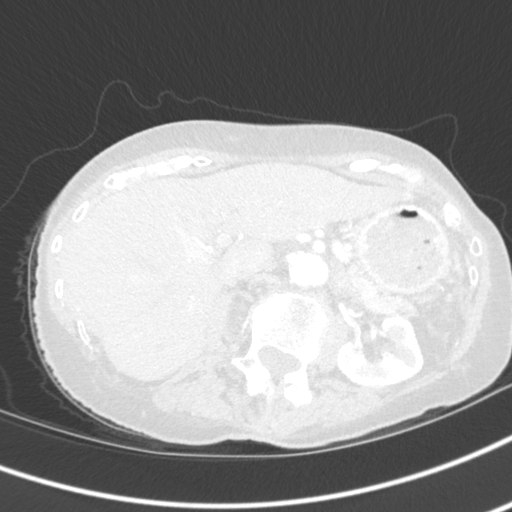
[im 25/165  lung]
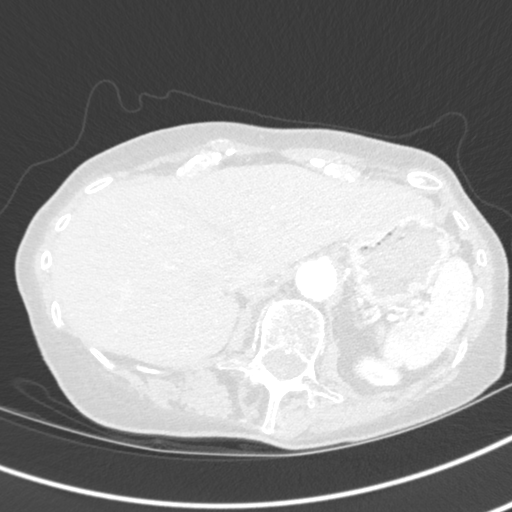
[im 37/165  lung]
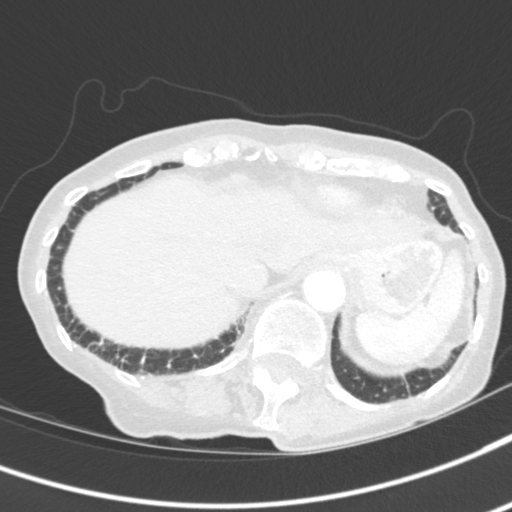
[im 49/165  lung]
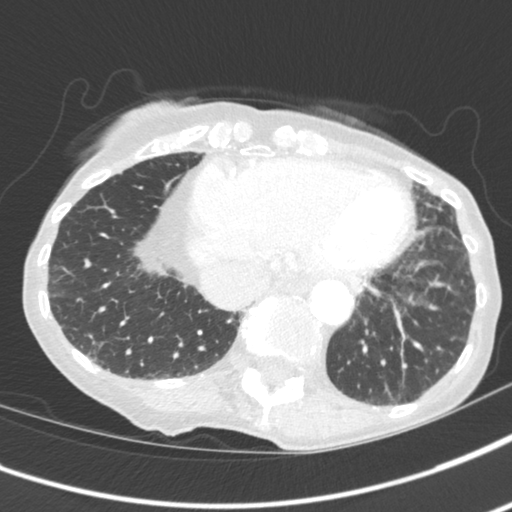
[im 61/165  mediastinal]
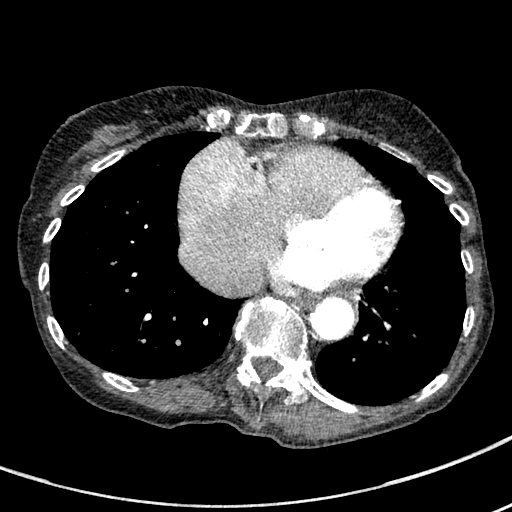
[im 61/165  lung]
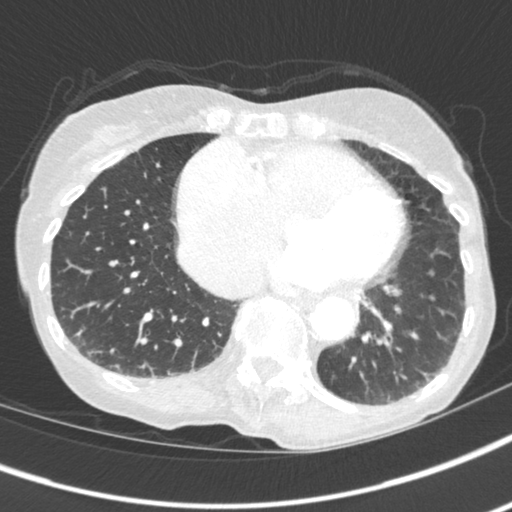
[im 73/165  lung]
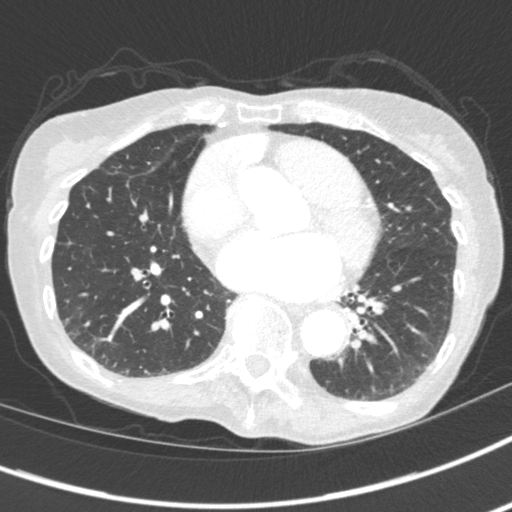
[im 92/165  lung]
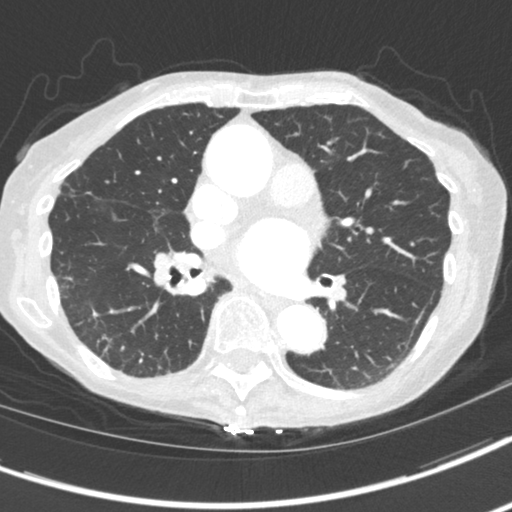
[im 104/165  lung]
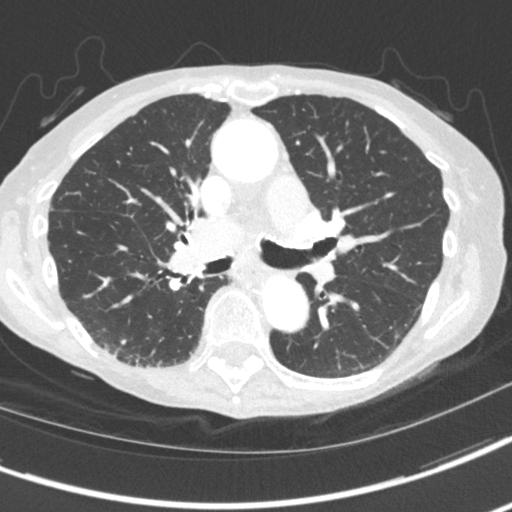
[im 116/165  mediastinal]
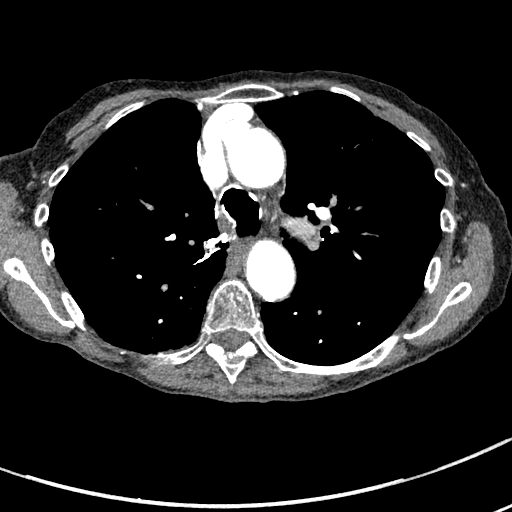
[im 116/165  lung]
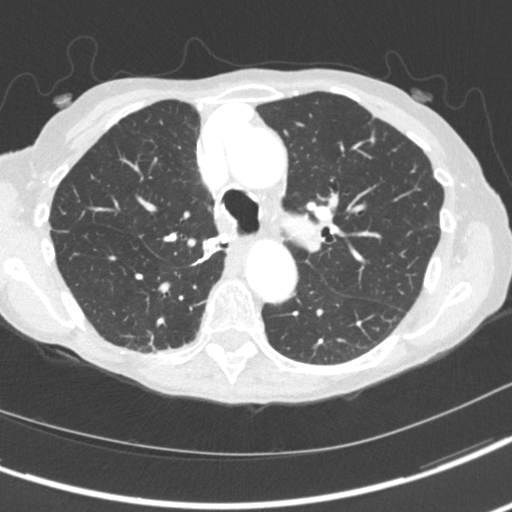
[im 128/165  lung]
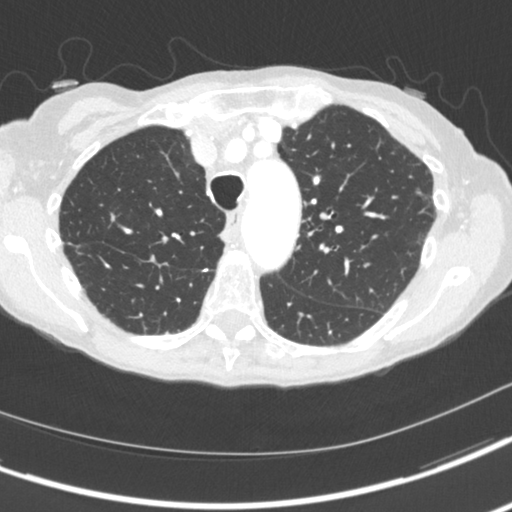
[im 140/165  lung]
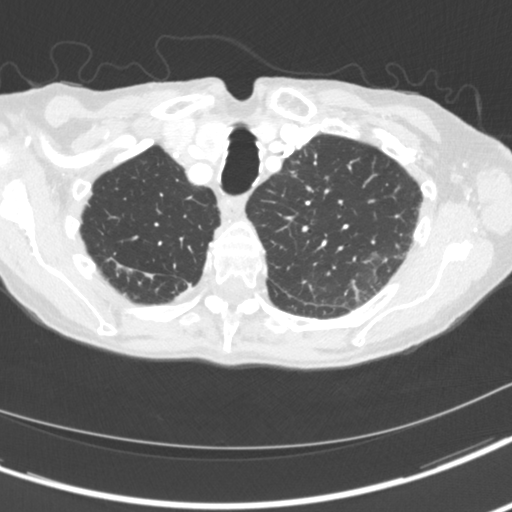
[im 152/165  lung]
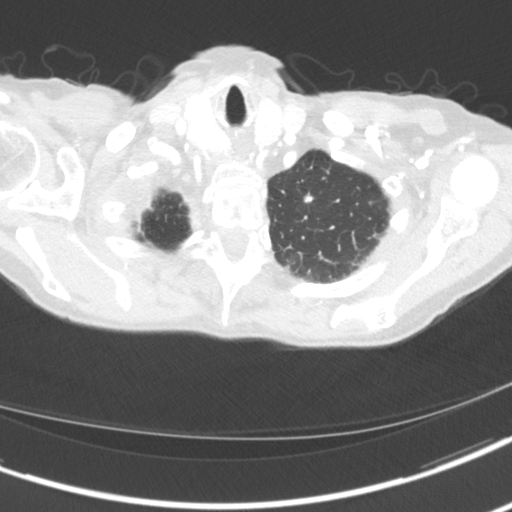

[Series 5: coronal · coronal · 0.52mm/px · 3 of 102 slices shown]
[im 21/102  lung]
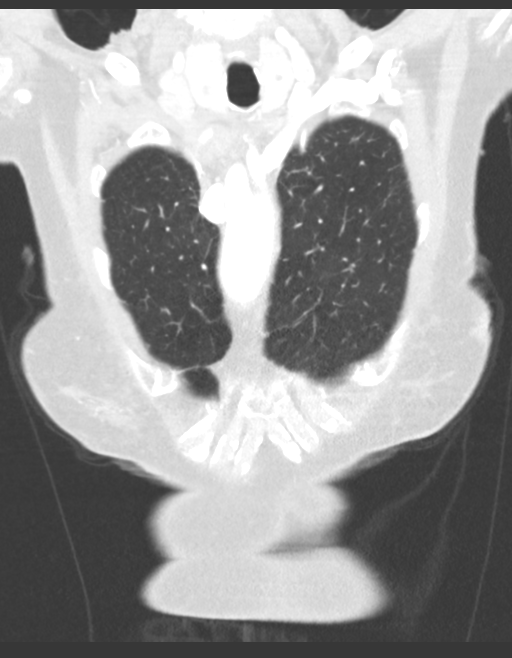
[im 41/102  lung]
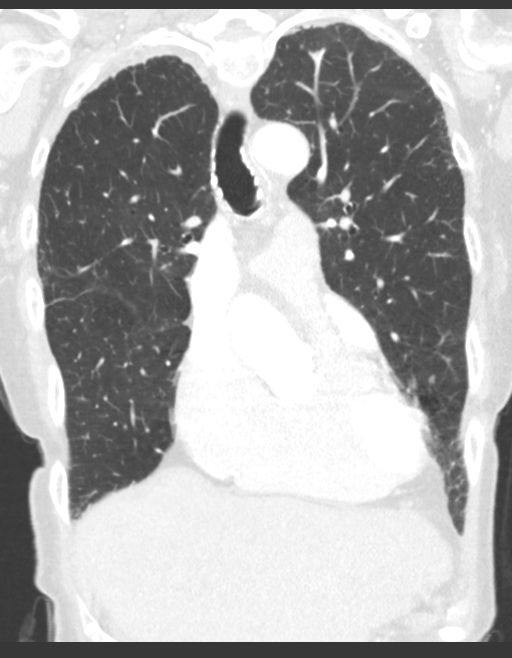
[im 61/102  lung]
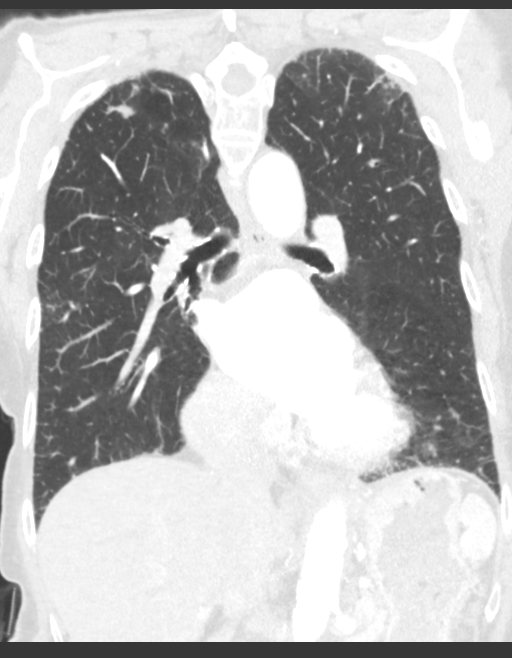

[15 of 36 positions shown; findings below may reference images not displayed]

FINDINGS: Cardiovascular: Ascending thoracic aorta measures 3.8 cm in
diameter. Heart size is moderately enlarged. Aortic atherosclerosis.
No pericardial effusion.

Mediastinum/Nodes: Thyroid nodules are again noted. The largest is
in the right lobe measuring 1.8 cm, image [DATE]. The trachea appears
patent. Unremarkable appearance of the esophagus. No enlarged
supraclavicular, axillary, mediastinal or hilar lymph nodes.

Lungs/Pleura: No pleural effusion identified. No airspace
consolidation, atelectasis or pneumothorax. Bilateral, basilar
predominant interstitial thickening is again identified. 6 mm nodule
within the left upper lobe is stable, image [DATE]. 3 mm left upper
lobe lung nodule is unchanged, image 48/4. Nodularity along the
unchanged. This includes a 4 mm perifissural nodule in the right
upper lobe, image 40/6.

Upper Abdomen: No acute abnormality.

Musculoskeletal: Scoliosis deformity noted. No aggressive lytic or
sclerotic bone lesions identified.
IMPRESSION: 1. Stable exam.  No findings to suggest lung cancer recurrence.
2. Small left lung nodules and perifissural nodularity in the right
lung are unchanged from previous exam.
3.  Aortic Atherosclerosis ([id number]).

## 2020-09-01 ENCOUNTER — Ambulatory Visit (INDEPENDENT_AMBULATORY_CARE_PROVIDER_SITE_OTHER): Payer: Medicare Other | Admitting: *Deleted

## 2020-09-01 DIAGNOSIS — Z5181 Encounter for therapeutic drug level monitoring: Secondary | ICD-10-CM

## 2020-09-01 DIAGNOSIS — I4891 Unspecified atrial fibrillation: Secondary | ICD-10-CM | POA: Diagnosis not present

## 2020-09-01 LAB — POCT INR: INR: 1.8 — AB (ref 2.0–3.0)

## 2020-09-01 NOTE — Patient Instructions (Signed)
Increase warfarin to 1 tablet daily except 1 1/2 tablets on Wednesdays and Saturdays Recheck in 2 wks

## 2020-09-15 ENCOUNTER — Ambulatory Visit (INDEPENDENT_AMBULATORY_CARE_PROVIDER_SITE_OTHER): Payer: Medicare Other | Admitting: *Deleted

## 2020-09-15 DIAGNOSIS — Z5181 Encounter for therapeutic drug level monitoring: Secondary | ICD-10-CM

## 2020-09-15 DIAGNOSIS — I4891 Unspecified atrial fibrillation: Secondary | ICD-10-CM | POA: Diagnosis not present

## 2020-09-15 LAB — POCT INR: INR: 2.4 (ref 2.0–3.0)

## 2020-09-15 NOTE — Patient Instructions (Signed)
Continue warfarin 1 tablet daily except 1 1/2 tablets on Wednesdays and Saturdays Recheck in 4 wks

## 2020-09-17 ENCOUNTER — Telehealth (HOSPITAL_COMMUNITY): Payer: Self-pay

## 2020-09-20 ENCOUNTER — Other Ambulatory Visit (HOSPITAL_COMMUNITY): Payer: Medicare Other

## 2020-09-27 ENCOUNTER — Other Ambulatory Visit: Payer: Self-pay

## 2020-09-27 ENCOUNTER — Inpatient Hospital Stay (HOSPITAL_COMMUNITY): Payer: Medicare Other | Attending: Internal Medicine

## 2020-09-27 DIAGNOSIS — C3490 Malignant neoplasm of unspecified part of unspecified bronchus or lung: Secondary | ICD-10-CM

## 2020-09-27 LAB — COMPREHENSIVE METABOLIC PANEL
ALT: 24 U/L (ref 0–44)
AST: 27 U/L (ref 15–41)
Albumin: 3.5 g/dL (ref 3.5–5.0)
Alkaline Phosphatase: 72 U/L (ref 38–126)
Anion gap: 6 (ref 5–15)
BUN: 23 mg/dL (ref 8–23)
CO2: 29 mmol/L (ref 22–32)
Calcium: 8.8 mg/dL — ABNORMAL LOW (ref 8.9–10.3)
Chloride: 100 mmol/L (ref 98–111)
Creatinine, Ser: 0.76 mg/dL (ref 0.44–1.00)
GFR, Estimated: >60 mL/min (ref >60)
Glucose, Bld: 94 mg/dL (ref 70–99)
Potassium: 4.2 mmol/L (ref 3.5–5.1)
Sodium: 135 mmol/L (ref 135–145)
Total Bilirubin: 0.8 mg/dL (ref 0.3–1.2)
Total Protein: 6.9 g/dL (ref 6.5–8.1)

## 2020-09-27 LAB — CBC WITH DIFFERENTIAL/PLATELET
Abs Immature Granulocytes: 0.03 10*3/uL (ref 0.00–0.07)
Basophils Absolute: 0.1 10*3/uL (ref 0.0–0.1)
Basophils Relative: 1 %
Eosinophils Absolute: 0.2 10*3/uL (ref 0.0–0.5)
Eosinophils Relative: 3 %
HCT: 46.3 % — ABNORMAL HIGH (ref 36.0–46.0)
Hemoglobin: 14.7 g/dL (ref 12.0–15.0)
Immature Granulocytes: 0 %
Lymphocytes Relative: 6 %
Lymphs Abs: 0.6 10*3/uL — ABNORMAL LOW (ref 0.7–4.0)
MCH: 30.6 pg (ref 26.0–34.0)
MCHC: 31.7 g/dL (ref 30.0–36.0)
MCV: 96.3 fL (ref 80.0–100.0)
Monocytes Absolute: 0.9 10*3/uL (ref 0.1–1.0)
Monocytes Relative: 10 %
Neutro Abs: 7.7 10*3/uL (ref 1.7–7.7)
Neutrophils Relative %: 80 %
Platelets: 238 10*3/uL (ref 150–400)
RBC: 4.81 MIL/uL (ref 3.87–5.11)
RDW: 15.6 % — ABNORMAL HIGH (ref 11.5–15.5)
WBC: 9.6 10*3/uL (ref 4.0–10.5)
nRBC: 0 % (ref 0.0–0.2)

## 2020-09-29 ENCOUNTER — Observation Stay (HOSPITAL_COMMUNITY): Payer: Medicare Other

## 2020-09-29 ENCOUNTER — Other Ambulatory Visit (HOSPITAL_COMMUNITY): Payer: Medicare Other

## 2020-09-29 ENCOUNTER — Ambulatory Visit (HOSPITAL_COMMUNITY)
Admission: RE | Admit: 2020-09-29 | Discharge: 2020-09-29 | Disposition: A | Discharge status: Home / Self Care | Payer: Medicare Other | Source: Ambulatory Visit | Attending: Internal Medicine | Admitting: Internal Medicine

## 2020-09-29 DIAGNOSIS — J849 Interstitial pulmonary disease, unspecified: Secondary | ICD-10-CM | POA: Diagnosis not present

## 2020-09-29 DIAGNOSIS — I77810 Thoracic aortic ectasia: Secondary | ICD-10-CM | POA: Diagnosis not present

## 2020-09-29 DIAGNOSIS — I2510 Atherosclerotic heart disease of native coronary artery without angina pectoris: Secondary | ICD-10-CM | POA: Diagnosis not present

## 2020-09-29 DIAGNOSIS — C3490 Malignant neoplasm of unspecified part of unspecified bronchus or lung: Secondary | ICD-10-CM | POA: Insufficient documentation

## 2020-09-29 MED ORDER — IOHEXOL 300 MG/ML  SOLN
75.0000 mL | Freq: Once | INTRAMUSCULAR | Status: AC | PRN
  Administered 2020-09-29: 75 mL via INTRAVENOUS

## 2020-10-05 ENCOUNTER — Inpatient Hospital Stay: Payer: Medicare Other | Attending: Internal Medicine | Admitting: Internal Medicine

## 2020-10-05 ENCOUNTER — Encounter: Payer: Self-pay | Admitting: Internal Medicine

## 2020-10-05 ENCOUNTER — Other Ambulatory Visit: Payer: Self-pay

## 2020-10-05 ENCOUNTER — Other Ambulatory Visit: Payer: Self-pay | Admitting: Medical Oncology

## 2020-10-05 VITALS — BP 124/73 | HR 94 | Temp 97.5°F | Resp 17 | Ht 63.0 in | Wt 97.0 lb

## 2020-10-05 DIAGNOSIS — I517 Cardiomegaly: Secondary | ICD-10-CM | POA: Diagnosis not present

## 2020-10-05 DIAGNOSIS — Z888 Allergy status to other drugs, medicaments and biological substances status: Secondary | ICD-10-CM | POA: Diagnosis not present

## 2020-10-05 DIAGNOSIS — M549 Dorsalgia, unspecified: Secondary | ICD-10-CM | POA: Diagnosis not present

## 2020-10-05 DIAGNOSIS — Z7901 Long term (current) use of anticoagulants: Secondary | ICD-10-CM | POA: Insufficient documentation

## 2020-10-05 DIAGNOSIS — M419 Scoliosis, unspecified: Secondary | ICD-10-CM | POA: Diagnosis not present

## 2020-10-05 DIAGNOSIS — Z880 Allergy status to penicillin: Secondary | ICD-10-CM | POA: Insufficient documentation

## 2020-10-05 DIAGNOSIS — I4891 Unspecified atrial fibrillation: Secondary | ICD-10-CM | POA: Insufficient documentation

## 2020-10-05 DIAGNOSIS — E041 Nontoxic single thyroid nodule: Secondary | ICD-10-CM | POA: Insufficient documentation

## 2020-10-05 DIAGNOSIS — Z5181 Encounter for therapeutic drug level monitoring: Secondary | ICD-10-CM

## 2020-10-05 DIAGNOSIS — Z885 Allergy status to narcotic agent status: Secondary | ICD-10-CM | POA: Diagnosis not present

## 2020-10-05 DIAGNOSIS — C3411 Malignant neoplasm of upper lobe, right bronchus or lung: Secondary | ICD-10-CM | POA: Insufficient documentation

## 2020-10-05 DIAGNOSIS — Z881 Allergy status to other antibiotic agents status: Secondary | ICD-10-CM | POA: Diagnosis not present

## 2020-10-05 DIAGNOSIS — R5383 Other fatigue: Secondary | ICD-10-CM | POA: Diagnosis not present

## 2020-10-05 DIAGNOSIS — Z79899 Other long term (current) drug therapy: Secondary | ICD-10-CM | POA: Diagnosis not present

## 2020-10-05 DIAGNOSIS — Z886 Allergy status to analgesic agent status: Secondary | ICD-10-CM | POA: Diagnosis not present

## 2020-10-05 DIAGNOSIS — I700 Atherosclerosis of aorta: Secondary | ICD-10-CM | POA: Insufficient documentation

## 2020-10-05 NOTE — Progress Notes (Signed)
[place] Telephone:(336) [phone]   Fax:(336) [phone]  OFFICE PROGRESS NOTE  [name], MD [place] [address]  DIAGNOSIS: Recurrent non-small cell lung cancer presented with bilateral lung nodules in October 2016 initially diagnosed as a stage IA adenocarcinoma with positive EGFR mutation with deletion in exon 48 in December 2012.  PRIOR THERAPY: Status post bronchoscopy with right VATS and right upper lobectomy with lymph node dissection under the care of Dr. Servando Snare on 05/31/2011.  CURRENT THERAPY: Iressa 250 mg by mouth daily started 04/17/2015, status post 65 months of treatment.  INTERVAL HISTORY: Natalie Carlson 87 y.o. female returns to the clinic today for follow-up visit.  The patient is feeling fine today with no concerning complaints except for intermittent back pain.  She was able to work and mow her yard yesterday.  She denied having any current chest pain, shortness of breath, cough or hemoptysis.  She denied having any fever or chills.  She has no nausea, vomiting, diarrhea or constipation.  She has no headache or visual changes.  She continues to tolerate her treatment with Iressa fairly well.  She had repeat CT scan of the chest performed recently and she is here for evaluation and discussion of her risk her results.  MEDICAL HISTORY: Past Medical History:  Diagnosis Date  . Arthritis   . Atrial fibrillation ([place])   . Cataract of both eyes   . Chronic back pain   . Chronic diarrhea   . Colitis   . Hemorrhoids   . History of colon polyps   . History of migraines   . Hyperlipidemia   . Hypothyroidism   . Macular degeneration, dry   . Malignant neoplasm of right upper lobe of lung (HCC)    Stage 1A  EGFR positive  Deletion in exon 19.  ALK-negative  Right Upper Lobe Lung Mass  SUV 2.9 on PET Status post VATS and right upper lobe lobectomy 05/31/2011  . Mucus in stool   . Nephrolithiasis    1994 - passed on its on     ALLERGIES:  is allergic to doxycycline, entocort ec [budesonide], neurontin [gabapentin], vicodin [hydrocodone-acetaminophen], adhesive [tape], amoxicillin, chocolate, codeine, levaquin [levofloxacin in d5w], morphine and related, naprosyn [naproxen], penicillins, prednisone, tramadol, and uncoded nonscreenable allergen.  MEDICATIONS:  Current Outpatient Medications  Medication Sig Dispense Refill  . acetaminophen (TYLENOL) 500 MG tablet Take 500 mg by mouth. Patient takes as needed , which is seldom.    . Calcium Carbonate (CALCIUM 600 PO) Take 1 tablet by mouth 2 (two) times daily.    . diclofenac sodium (VOLTAREN) 1 % GEL Apply 1 application topically 2 (two) times daily as needed (for neck pain).     Marland Kitchen diltiazem (CARDIZEM CD) 180 MG 24 hr capsule TAKE ONE CAPSULE BY MOUTH EVERY DAY 90 capsule 2  . furosemide (LASIX) 20 MG tablet TAKE 1 TABLET BY MOUTH EVERY DAY AS NEEDED FOR FLUID 30 tablet 1  . gefitinib (IRESSA) 250 MG tablet Take 1 tablet (250 mg total) by mouth daily. 30 tablet 5  . HYDROmorphone (DILAUDID) 4 MG tablet Take 1 tablet (4 mg total) by mouth every 6 (six) hours as needed for severe pain. 20 tablet 0  . loperamide (IMODIUM A-D) 2 MG tablet Take 1 mg by mouth daily as needed for diarrhea or loose stools.    . LUTEIN PO Take 1 capsule by mouth daily. Reported on 10/27/2015    .  magic mouthwash SOLN Take 5 mLs by mouth 4 (four) times daily as needed for mouth pain. 240 mL 0  . magnesium 30 MG tablet Take 30 mg by mouth at bedtime.     . mesalamine (APRISO) 0.375 g 24 hr capsule TAKE 2 CAPSULE BY MOUTH TWICE DAILY 120 capsule 11  . Multiple Vitamins-Minerals (AIRBORNE) CHEW Chew 1 tablet by mouth daily.    . Multiple Vitamins-Minerals (HAIR/SKIN/NAILS) TABS Take 1 tablet by mouth daily.    . Multiple Vitamins-Minerals (PRESERVISION AREDS 2 PO) Take 1 tablet by mouth 2 (two) times daily with a meal.    . NON FORMULARY Eye In jection every 14 weeks. Both eyes are  injected. Last injection was 02/23/2020.    . Omega-3 Fatty Acids (FISH OIL) 1200 MG CAPS Take 1,200 mg by mouth daily.    . ondansetron (ZOFRAN ODT) 4 MG disintegrating tablet Take 1 tablet (4 mg total) by mouth every 8 (eight) hours as needed. 10 tablet 1  . potassium chloride (KLOR-CON) 10 MEQ tablet TAKE 1 TABLET BY MOUTH EVERY DAY 30 tablet 1  . warfarin (COUMADIN) 5 MG tablet Take 1 1/2 tablets daily except 1 tablet on Sundays, Tuesdays and Thursdays or as directed 40 tablet 6   No current facility-administered medications for this visit.    SURGICAL HISTORY:  Past Surgical History:  Procedure Laterality Date  . Bladder tack  1996  . CARDIOVERSION  11/02/09   x 2  . CATARACT EXTRACTION  2010/2011  . COLONOSCOPY  3 YRS AGO  . COLONOSCOPY  03/06/2011   Procedure: COLONOSCOPY;  Surgeon: Rogene Houston, MD;  [facility];  Service: Endoscopy;  Laterality: N/A;  7:30 am  . ESOPHAGOGASTRODUODENOSCOPY    . FLEXIBLE SIGMOIDOSCOPY N/A 10/04/2012   Procedure: FLEXIBLE SIGMOIDOSCOPY;  Surgeon: Rogene Houston, MD;  Location: [facility];  Service: Endoscopy;  Laterality: N/A;  11:00-moved to 1015 Ann to notify pt  . THYROID SURGERY  1961  . TONSILLECTOMY    . VIDEO BRONCHOSCOPY  05/31/2011   Procedure: VIDEO BRONCHOSCOPY;  Surgeon: Grace Isaac, MD;  Location: [facility] OR;  Service: Thoracic;  Laterality: N/A;    REVIEW OF SYSTEMS:  Constitutional: positive for fatigue Eyes: negative Ears, nose, mouth, throat, and face: negative Respiratory: negative Cardiovascular: negative Gastrointestinal: negative Genitourinary:negative Integument/breast: negative Hematologic/lymphatic: negative Musculoskeletal:positive for back pain Neurological: negative Behavioral/Psych: negative Endocrine: negative Allergic/Immunologic: negative   PHYSICAL EXAMINATION: General appearance: alert, cooperative, fatigued and no distress Head: Normocephalic, without obvious abnormality,  atraumatic Neck: no adenopathy, no JVD, supple, symmetrical, trachea midline and thyroid not enlarged, symmetric, no tenderness/mass/nodules Lymph nodes: Cervical, supraclavicular, and axillary nodes normal. Resp: clear to auscultation bilaterally Back: symmetric, no curvature. ROM normal. No CVA tenderness. Cardio: regular rate and rhythm, S1, S2 normal, no murmur, click, rub or gallop GI: soft, non-tender; bowel sounds normal; no masses,  no organomegaly Extremities: extremities normal, atraumatic, no cyanosis or edema Neurologic: Alert and oriented X 3, normal strength and tone. Normal symmetric reflexes. Normal coordination and gait  ECOG PERFORMANCE STATUS: 0 - Asymptomatic  Blood pressure 124/73, pulse 94, temperature (!) 97.5 F (36.4 C), temperature source Tympanic, resp. rate 17, height 5' 3"  (1.6 m), weight 97 lb (44 kg), SpO2 97 %.  LABORATORY DATA: Lab Results  Component Value Date   WBC 9.6 09/27/2020   HGB 14.7 09/27/2020   HCT 46.3 (H) 09/27/2020   MCV 96.3 09/27/2020   PLT 238 09/27/2020  Chemistry      Component Value Date/Time   NA 135 09/27/2020 0939   NA 141 05/14/2017 1304   K 4.2 09/27/2020 0939   K 4.3 05/14/2017 1304   CL 100 09/27/2020 0939   CO2 29 09/27/2020 0939   CO2 30 (H) 05/14/2017 1304   BUN 23 09/27/2020 0939   BUN 19.0 05/14/2017 1304   CREATININE 0.76 09/27/2020 0939   CREATININE 1.01 (H) 08/02/2020 1305   CREATININE 0.8 05/14/2017 1304      Component Value Date/Time   CALCIUM 8.8 (L) 09/27/2020 0939   CALCIUM 9.1 05/14/2017 1304   ALKPHOS 72 09/27/2020 0939   ALKPHOS 86 05/14/2017 1304   AST 27 09/27/2020 0939   AST 29 08/02/2020 1305   AST 25 05/14/2017 1304   ALT 24 09/27/2020 0939   ALT 17 08/02/2020 1305   ALT 15 05/14/2017 1304   BILITOT 0.8 09/27/2020 0939   BILITOT 0.5 08/02/2020 1305   BILITOT 0.57 05/14/2017 1304       RADIOGRAPHIC STUDIES: CT Chest W Contrast  Result Date: 09/29/2020 CLINICAL DATA:   Primary Cancer Type: Lung Imaging Indication: Routine surveillance Interval therapy since last imaging? Yes Initial Cancer Diagnosis Date: 04/2011; Established by: Biopsy-proven Detailed Pathology: Recurrent non-small cell lung cancer initially diagnosed as a stage IA adenocarcinoma. Primary Tumor location: Right upper lobe. Recurrence? Yes; Date(s) of recurrence: 03/29/2015; Established by: Biopsy-proven Surgeries: Right upper lobectomy 05/31/2011. Chemotherapy: Yes; Ongoing?  Yes; Iressa daily Immunotherapy? No Radiation therapy? No EXAM: CT CHEST WITH CONTRAST TECHNIQUE: Multidetector CT imaging of the chest was performed during intravenous contrast administration. CONTRAST:  26m OMNIPAQUE IOHEXOL 300 MG/ML  SOLN COMPARISON:  Most recent CT chest 03/19/2020.  03/11/2015 PET-CT. FINDINGS: Cardiovascular: Ectasia of the ascending thoracic aorta measures up to 3.7 cm. Right heart enlargement is again noted. No pericardial effusion. Aortic atherosclerosis. Lad and RCA coronary artery calcifications. Mediastinum/Nodes: Right lobe of thyroid gland nodule measures 1.8 cm, image 22/2. This has been evaluated on previous imaging. (ref: J Am Coll Radiol. 2015 Feb;12(2): 143-50). The trachea appears patent and is midline. Normal appearance of the esophagus. No enlarged supraclavicular or axillary nodes. No mediastinal or hilar adenopathy. Lungs/Pleura: No pleural effusion, airspace consolidation, atelectasis, or pneumothorax. Progressive lower lung zone predominant interstitial septal thickening and subpleural reticulation is identified. New multifocal, patchy areas of ground-glass attenuation are noted within the right lung base and lingula. The index nodule within the apex of the left lung is stable measuring 0.7 cm, image 14/4. Upper Abdomen: No acute abnormality. Stone within the left renal collecting system is identified measuring 3 mm, image 160/2. Musculoskeletal: Scoliosis and degenerative disc disease. No acute or  suspicious bone lesions. IMPRESSION: 1. Stable appearance of 0.7 cm left upper lobe lung nodule. 2. Progressive lower lung zone predominant interstitial septal thickening and subpleural reticulation. Findings are nonspecific and may be the sequelae of chronic interstitial lung disease. Consider follow-up imaging with high-resolution CT of the chest. 3. New multifocal, patchy areas of ground-glass attenuation are noted within the right lung base and lingula. Findings are nonspecific, but favored postinflammatory. 4. Multi vessel coronary artery calcifications noted. 5. Nonobstructing left renal calculus. 6. Right lobe of thyroid gland nodule measures 1.8 cm. This has been evaluated on previous imaging. (ref: J Am Coll Radiol. 2015 Feb;12(2): 143-50). Aortic Atherosclerosis (ICD10-I70.0). Electronically Signed   By: TKerby MoorsM.D.   On: 09/29/2020 11:31    ASSESSMENT AND PLAN:  This is a very pleasant 87years old  white female with recurrent non-small cell lung cancer, adenocarcinoma with positive EGFR mutation. The patient has been on treatment with Iressa 250 mg p.o. daily for the last 65 months. The patient continues to tolerate this treatment well with no concerning adverse effect except for mild fatigue. She had repeat CT scan of the chest performed recently.  I personally and independently reviewed the scan images and discussed the results with the patient today. Her scan showed no concerning findings for disease progression and the patient continues to have new Multi focal patchy areas of groundglass attenuation that need close monitoring. I recommended for her to continue her current treatment with Tagrisso with the same dose. I will see her back for follow-up visit in 2 months for evaluation and repeat blood work. She was advised to call immediately if she has any other concerning symptoms in the interval. The patient voices understanding of current disease status and treatment options and is  in agreement with the current care plan. All questions were answered. The patient knows to call the clinic with any problems, questions or concerns. We can certainly see the patient much sooner if necessary.  Disclaimer: This note was dictated with voice recognition software. Similar sounding words can inadvertently be transcribed and may not be corrected upon review.

## 2020-10-07 ENCOUNTER — Telehealth: Payer: Self-pay | Admitting: Internal Medicine

## 2020-10-07 NOTE — Telephone Encounter (Signed)
Scheduled per los. Called and left msg. Mailed printout  °

## 2020-10-13 ENCOUNTER — Ambulatory Visit (INDEPENDENT_AMBULATORY_CARE_PROVIDER_SITE_OTHER): Payer: Medicare Other | Admitting: *Deleted

## 2020-10-13 DIAGNOSIS — I4891 Unspecified atrial fibrillation: Secondary | ICD-10-CM | POA: Diagnosis not present

## 2020-10-13 DIAGNOSIS — Z5181 Encounter for therapeutic drug level monitoring: Secondary | ICD-10-CM | POA: Diagnosis not present

## 2020-10-13 LAB — POCT INR: INR: 2.7 (ref 2.0–3.0)

## 2020-10-13 NOTE — Patient Instructions (Signed)
Continue warfarin 1 tablet daily except 1 1/2 tablets on Wednesdays and Saturdays Recheck in 6 wks

## 2020-10-25 DIAGNOSIS — H35371 Puckering of macula, right eye: Secondary | ICD-10-CM | POA: Diagnosis not present

## 2020-10-25 DIAGNOSIS — H353231 Exudative age-related macular degeneration, bilateral, with active choroidal neovascularization: Secondary | ICD-10-CM | POA: Diagnosis not present

## 2020-10-25 DIAGNOSIS — H43813 Vitreous degeneration, bilateral: Secondary | ICD-10-CM | POA: Diagnosis not present

## 2020-10-25 DIAGNOSIS — H43391 Other vitreous opacities, right eye: Secondary | ICD-10-CM | POA: Diagnosis not present

## 2020-11-08 DIAGNOSIS — Z23 Encounter for immunization: Secondary | ICD-10-CM | POA: Diagnosis not present

## 2020-11-24 ENCOUNTER — Ambulatory Visit (INDEPENDENT_AMBULATORY_CARE_PROVIDER_SITE_OTHER): Payer: Medicare Other | Admitting: *Deleted

## 2020-11-24 DIAGNOSIS — Z5181 Encounter for therapeutic drug level monitoring: Secondary | ICD-10-CM

## 2020-11-24 DIAGNOSIS — I4891 Unspecified atrial fibrillation: Secondary | ICD-10-CM

## 2020-11-24 LAB — POCT INR: INR: 2.1 (ref 2.0–3.0)

## 2020-11-24 NOTE — Patient Instructions (Signed)
Continue warfarin 1 tablet daily except 1 1/2 tablets on Wednesdays and Saturdays Recheck in 6 wks

## 2020-12-07 ENCOUNTER — Inpatient Hospital Stay: Payer: Medicare Other | Attending: Internal Medicine | Admitting: Internal Medicine

## 2020-12-07 ENCOUNTER — Other Ambulatory Visit: Payer: Self-pay

## 2020-12-07 ENCOUNTER — Inpatient Hospital Stay: Payer: Medicare Other

## 2020-12-07 ENCOUNTER — Telehealth: Payer: Self-pay | Admitting: Internal Medicine

## 2020-12-07 VITALS — BP 144/92 | HR 73 | Temp 97.7°F | Resp 18 | Wt 94.2 lb

## 2020-12-07 DIAGNOSIS — Z7901 Long term (current) use of anticoagulants: Secondary | ICD-10-CM | POA: Insufficient documentation

## 2020-12-07 DIAGNOSIS — Z881 Allergy status to other antibiotic agents status: Secondary | ICD-10-CM | POA: Diagnosis not present

## 2020-12-07 DIAGNOSIS — C3411 Malignant neoplasm of upper lobe, right bronchus or lung: Secondary | ICD-10-CM | POA: Diagnosis not present

## 2020-12-07 DIAGNOSIS — Z888 Allergy status to other drugs, medicaments and biological substances status: Secondary | ICD-10-CM | POA: Diagnosis not present

## 2020-12-07 DIAGNOSIS — Z885 Allergy status to narcotic agent status: Secondary | ICD-10-CM | POA: Insufficient documentation

## 2020-12-07 DIAGNOSIS — I4891 Unspecified atrial fibrillation: Secondary | ICD-10-CM | POA: Diagnosis not present

## 2020-12-07 DIAGNOSIS — Z886 Allergy status to analgesic agent status: Secondary | ICD-10-CM | POA: Diagnosis not present

## 2020-12-07 DIAGNOSIS — Z79899 Other long term (current) drug therapy: Secondary | ICD-10-CM | POA: Insufficient documentation

## 2020-12-07 DIAGNOSIS — Z8719 Personal history of other diseases of the digestive system: Secondary | ICD-10-CM | POA: Diagnosis not present

## 2020-12-07 DIAGNOSIS — Z5181 Encounter for therapeutic drug level monitoring: Secondary | ICD-10-CM

## 2020-12-07 DIAGNOSIS — Z880 Allergy status to penicillin: Secondary | ICD-10-CM | POA: Diagnosis not present

## 2020-12-07 LAB — CMP (CANCER CENTER ONLY)
ALT: 15 U/L (ref 0–44)
AST: 29 U/L (ref 15–41)
Albumin: 3.6 g/dL (ref 3.5–5.0)
Alkaline Phosphatase: 66 U/L (ref 38–126)
Anion gap: 5 (ref 5–15)
BUN: 19 mg/dL (ref 8–23)
CO2: 32 mmol/L (ref 22–32)
Calcium: 9.4 mg/dL (ref 8.9–10.3)
Chloride: 105 mmol/L (ref 98–111)
Creatinine: 0.9 mg/dL (ref 0.44–1.00)
GFR, Estimated: >60 mL/min (ref >60)
Glucose, Bld: 101 mg/dL — ABNORMAL HIGH (ref 70–99)
Potassium: 5.3 mmol/L — ABNORMAL HIGH (ref 3.5–5.1)
Sodium: 142 mmol/L (ref 135–145)
Total Bilirubin: 0.6 mg/dL (ref 0.3–1.2)
Total Protein: 7 g/dL (ref 6.5–8.1)

## 2020-12-07 LAB — CBC WITH DIFFERENTIAL (CANCER CENTER ONLY)
Abs Immature Granulocytes: 0 10*3/uL (ref 0.00–0.07)
Basophils Absolute: 0.1 10*3/uL (ref 0.0–0.1)
Basophils Relative: 1 %
Eosinophils Absolute: 0.2 10*3/uL (ref 0.0–0.5)
Eosinophils Relative: 3 %
HCT: 43.8 % (ref 36.0–46.0)
Hemoglobin: 14 g/dL (ref 12.0–15.0)
Immature Granulocytes: 0 %
Lymphocytes Relative: 23 %
Lymphs Abs: 1.3 10*3/uL (ref 0.7–4.0)
MCH: 30.2 pg (ref 26.0–34.0)
MCHC: 32 g/dL (ref 30.0–36.0)
MCV: 94.6 fL (ref 80.0–100.0)
Monocytes Absolute: 0.8 10*3/uL (ref 0.1–1.0)
Monocytes Relative: 15 %
Neutro Abs: 3.1 10*3/uL (ref 1.7–7.7)
Neutrophils Relative %: 58 %
Platelet Count: 231 10*3/uL (ref 150–400)
RBC: 4.63 MIL/uL (ref 3.87–5.11)
RDW: 14 % (ref 11.5–15.5)
WBC Count: 5.4 10*3/uL (ref 4.0–10.5)
nRBC: 0 % (ref 0.0–0.2)

## 2020-12-07 NOTE — Progress Notes (Signed)
[place] Telephone:(336) [phone]   Fax:(336) [phone]  OFFICE PROGRESS NOTE  [name], MD [place] [address]  DIAGNOSIS: Recurrent non-small cell lung cancer presented with bilateral lung nodules in October 2016 initially diagnosed as a stage IA adenocarcinoma with positive EGFR mutation with deletion in exon 41 in December 2012.  PRIOR THERAPY: Status post bronchoscopy with right VATS and right upper lobectomy with lymph node dissection under the care of Dr. Servando Snare on 05/31/2011.  CURRENT THERAPY: Iressa 250 mg by mouth daily started 04/17/2015, status post 67 months of treatment.  INTERVAL HISTORY: Natalie Carlson 87 y.o. female returns to the clinic today for 79-monthfollow-up visit.  The patient is feeling fine today with no concerning complaints.  She has no current chest pain, shortness of breath, cough or hemoptysis.  She has no nausea, vomiting, diarrhea or constipation.  She has no headache or visual changes.  She continues to tolerate her treatment with Iressa fairly well.  The patient is here today for evaluation and repeat blood work.   MEDICAL HISTORY: Past Medical History:  Diagnosis Date   Arthritis    Atrial fibrillation (HCC)    Cataract of both eyes    Chronic back pain    Chronic diarrhea    Colitis    Hemorrhoids    History of colon polyps    History of migraines    Hyperlipidemia    Hypothyroidism    Macular degeneration, dry    Malignant neoplasm of right upper lobe of lung (HCC)    Stage 1A  EGFR positive  Deletion in exon 19.  ALK-negative  Right Upper Lobe Lung Mass  SUV 2.9 on PET Status post VATS and right upper lobe lobectomy 05/31/2011   Mucus in stool    Nephrolithiasis    102/24/94- passed on its on    ALLERGIES:  is allergic to doxycycline, entocort ec [budesonide], neurontin [gabapentin], vicodin [hydrocodone-acetaminophen], adhesive [tape], amoxicillin, chocolate, codeine, levaquin [levofloxacin in d5w],  morphine and related, naprosyn [naproxen], penicillins, prednisone, tramadol, and uncoded nonscreenable allergen.  MEDICATIONS:  Current Outpatient Medications  Medication Sig Dispense Refill   acetaminophen (TYLENOL) 500 MG tablet Take 500 mg by mouth. Patient takes as needed , which is seldom.     Calcium Carbonate (CALCIUM 600 PO) Take 1 tablet by mouth 2 (two) times daily.     diclofenac sodium (VOLTAREN) 1 % GEL Apply 1 application topically 2 (two) times daily as needed (for neck pain).      diltiazem (CARDIZEM CD) 180 MG 24 hr capsule TAKE ONE CAPSULE BY MOUTH EVERY DAY 90 capsule 2   furosemide (LASIX) 20 MG tablet TAKE 1 TABLET BY MOUTH EVERY DAY AS NEEDED FOR FLUID 30 tablet 1   gefitinib (IRESSA) 250 MG tablet Take 1 tablet (250 mg total) by mouth daily. 30 tablet 5   HYDROmorphone (DILAUDID) 4 MG tablet Take 1 tablet (4 mg total) by mouth every 6 (six) hours as needed for severe pain. 20 tablet 0   loperamide (IMODIUM A-D) 2 MG tablet Take 1 mg by mouth daily as needed for diarrhea or loose stools.     LUTEIN PO Take 1 capsule by mouth daily. Reported on 10/27/2015     magic mouthwash SOLN Take 5 mLs by mouth 4 (four) times daily as needed for mouth pain. 240 mL 0   magnesium 30 MG tablet Take 30 mg by mouth at bedtime.  mesalamine (APRISO) 0.375 g 24 hr capsule TAKE 2 CAPSULE BY MOUTH TWICE DAILY 120 capsule 11   Multiple Vitamins-Minerals (AIRBORNE) CHEW Chew 1 tablet by mouth daily.     Multiple Vitamins-Minerals (HAIR/SKIN/NAILS) TABS Take 1 tablet by mouth daily.     Multiple Vitamins-Minerals (PRESERVISION AREDS 2 PO) Take 1 tablet by mouth 2 (two) times daily with a meal.     NON FORMULARY Eye In jection every 14 weeks. Both eyes are injected. Last injection was 02/23/2020.     Omega-3 Fatty Acids (FISH OIL) 1200 MG CAPS Take 1,200 mg by mouth daily.     ondansetron (ZOFRAN ODT) 4 MG disintegrating tablet Take 1 tablet (4 mg total) by mouth every 8 (eight) hours as  needed. 10 tablet 1   potassium chloride (KLOR-CON) 10 MEQ tablet TAKE 1 TABLET BY MOUTH EVERY DAY 30 tablet 1   warfarin (COUMADIN) 5 MG tablet Take 1 1/2 tablets daily except 1 tablet on Sundays, Tuesdays and Thursdays or as directed 40 tablet 6   No current facility-administered medications for this visit.    SURGICAL HISTORY:  Past Surgical History:  Procedure Laterality Date   Bladder tack  1996   CARDIOVERSION  11/02/09   x 2   CATARACT EXTRACTION  2010/2011   COLONOSCOPY  3 YRS AGO   COLONOSCOPY  03/06/2011   Procedure: COLONOSCOPY;  Surgeon: Rogene Houston, MD;  [facility];  Service: Endoscopy;  Laterality: N/A;  7:30 am   ESOPHAGOGASTRODUODENOSCOPY     FLEXIBLE SIGMOIDOSCOPY N/A 10/04/2012   Procedure: FLEXIBLE SIGMOIDOSCOPY;  Surgeon: Rogene Houston, MD;  [facility]: [facility];  Service: Endoscopy;  Laterality: N/A;  11:00-moved to 1015 Ann to notify pt   THYROID SURGERY  1961   TONSILLECTOMY     VIDEO BRONCHOSCOPY  05/31/2011   Procedure: VIDEO BRONCHOSCOPY;  Surgeon: Grace Isaac, MD;  Location: [facility] OR;  Service: Thoracic;  Laterality: N/A;    REVIEW OF SYSTEMS:  A comprehensive review of systems was negative.   PHYSICAL EXAMINATION: General appearance: alert, cooperative, and no distress Head: Normocephalic, without obvious abnormality, atraumatic Neck: no adenopathy, no JVD, supple, symmetrical, trachea midline, and thyroid not enlarged, symmetric, no tenderness/mass/nodules Lymph nodes: Cervical, supraclavicular, and axillary nodes normal. Resp: clear to auscultation bilaterally Back: symmetric, no curvature. ROM normal. No CVA tenderness. Cardio: regular rate and rhythm, S1, S2 normal, no murmur, click, rub or gallop GI: soft, non-tender; bowel sounds normal; no masses,  no organomegaly Extremities: extremities normal, atraumatic, no cyanosis or edema  ECOG PERFORMANCE STATUS: 0 - Asymptomatic  Blood pressure (!) 144/92, pulse 73, temperature  97.7 F (36.5 C), temperature source Tympanic, resp. rate 18, weight 94 lb 3 oz (42.7 kg), SpO2 98 %.  LABORATORY DATA: Lab Results  Component Value Date   WBC 5.4 12/07/2020   HGB 14.0 12/07/2020   HCT 43.8 12/07/2020   MCV 94.6 12/07/2020   PLT 231 12/07/2020      Chemistry      Component Value Date/Time   NA 135 09/27/2020 0939   NA 141 05/14/2017 1304   K 4.2 09/27/2020 0939   K 4.3 05/14/2017 1304   CL 100 09/27/2020 0939   CO2 29 09/27/2020 0939   CO2 30 (H) 05/14/2017 1304   BUN 23 09/27/2020 0939   BUN 19.0 05/14/2017 1304   CREATININE 0.76 09/27/2020 0939   CREATININE 1.01 (H) 08/02/2020 1305   CREATININE 0.8 05/14/2017 1304      Component  Value Date/Time   CALCIUM 8.8 (L) 09/27/2020 0939   CALCIUM 9.1 05/14/2017 1304   ALKPHOS 72 09/27/2020 0939   ALKPHOS 86 05/14/2017 1304   AST 27 09/27/2020 0939   AST 29 08/02/2020 1305   AST 25 05/14/2017 1304   ALT 24 09/27/2020 0939   ALT 17 08/02/2020 1305   ALT 15 05/14/2017 1304   BILITOT 0.8 09/27/2020 0939   BILITOT 0.5 08/02/2020 1305   BILITOT 0.57 05/14/2017 1304       RADIOGRAPHIC STUDIES: No results found.   ASSESSMENT AND PLAN:  This is a very pleasant 87 years old white female with recurrent non-small cell lung cancer, adenocarcinoma with positive EGFR mutation. The patient has been on treatment with Iressa 250 mg p.o. daily for the last 67 months. She continues to tolerate this treatment well with no concerning adverse effects. Her lab work today is unremarkable for any abnormality. I recommended for her to continue her current treatment with Tagrisso with the same dose. I will see her back for follow-up visit in 2 months for evaluation and repeat blood work. She was advised to call immediately if she has any other concerning symptoms in the interval. The patient voices understanding of current disease status and treatment options and is in agreement with the current care plan. All questions  were answered. The patient knows to call the clinic with any problems, questions or concerns. We can certainly see the patient much sooner if necessary.  Disclaimer: This note was dictated with voice recognition software. Similar sounding words can inadvertently be transcribed and may not be corrected upon review.

## 2020-12-07 NOTE — Telephone Encounter (Signed)
Scheduled per los. Gave avs and calendar  

## 2021-01-06 ENCOUNTER — Ambulatory Visit (INDEPENDENT_AMBULATORY_CARE_PROVIDER_SITE_OTHER): Payer: Medicare Other | Admitting: *Deleted

## 2021-01-06 DIAGNOSIS — Z5181 Encounter for therapeutic drug level monitoring: Secondary | ICD-10-CM

## 2021-01-06 DIAGNOSIS — I4891 Unspecified atrial fibrillation: Secondary | ICD-10-CM | POA: Diagnosis not present

## 2021-01-06 LAB — POCT INR: INR: 2.5 (ref 2.0–3.0)

## 2021-01-06 NOTE — Patient Instructions (Signed)
Description   Continue warfarin 1 tablet daily except 1 1/2 tablets on Wednesdays and Saturdays Recheck in 6 wks

## 2021-01-14 DIAGNOSIS — J040 Acute laryngitis: Secondary | ICD-10-CM | POA: Diagnosis not present

## 2021-01-14 DIAGNOSIS — J069 Acute upper respiratory infection, unspecified: Secondary | ICD-10-CM | POA: Diagnosis not present

## 2021-01-18 ENCOUNTER — Telehealth: Payer: Self-pay | Admitting: Family Medicine

## 2021-01-18 NOTE — Telephone Encounter (Signed)
Pt went to PCP on Friday.  Had URI.  Received steroid shot and was started on Keflex.  Stated she had several nose bleeds yesterday that she was able to get stopped.  Pt has not had any nose bleeds since. Told pt to eat Extra Vit K foods today and monitor.  If she has more nose bleeds today she will hold warfarin tonight and call back for an INR appt tomorrow.  Pt in agreement.

## 2021-01-18 NOTE — Telephone Encounter (Signed)
New message   Patient would like a call back she started at new antibiotic and she thinks its messing with her coumadin, she is having nose bleeds. Cephalexin

## 2021-01-19 ENCOUNTER — Other Ambulatory Visit: Payer: Self-pay | Admitting: Cardiology

## 2021-01-20 DIAGNOSIS — H1033 Unspecified acute conjunctivitis, bilateral: Secondary | ICD-10-CM | POA: Diagnosis not present

## 2021-01-24 DIAGNOSIS — J329 Chronic sinusitis, unspecified: Secondary | ICD-10-CM | POA: Diagnosis not present

## 2021-01-24 DIAGNOSIS — Z7189 Other specified counseling: Secondary | ICD-10-CM | POA: Diagnosis not present

## 2021-02-04 DIAGNOSIS — H1045 Other chronic allergic conjunctivitis: Secondary | ICD-10-CM | POA: Diagnosis not present

## 2021-02-07 ENCOUNTER — Other Ambulatory Visit: Payer: Self-pay

## 2021-02-07 ENCOUNTER — Inpatient Hospital Stay: Payer: Medicare Other

## 2021-02-07 ENCOUNTER — Inpatient Hospital Stay: Payer: Medicare Other | Attending: Internal Medicine | Admitting: Internal Medicine

## 2021-02-07 VITALS — BP 142/82 | HR 75 | Temp 98.0°F | Resp 17 | Wt 95.6 lb

## 2021-02-07 DIAGNOSIS — Z79899 Other long term (current) drug therapy: Secondary | ICD-10-CM | POA: Diagnosis not present

## 2021-02-07 DIAGNOSIS — Z7901 Long term (current) use of anticoagulants: Secondary | ICD-10-CM | POA: Insufficient documentation

## 2021-02-07 DIAGNOSIS — C3411 Malignant neoplasm of upper lobe, right bronchus or lung: Secondary | ICD-10-CM

## 2021-02-07 DIAGNOSIS — Z885 Allergy status to narcotic agent status: Secondary | ICD-10-CM | POA: Diagnosis not present

## 2021-02-07 DIAGNOSIS — Z881 Allergy status to other antibiotic agents status: Secondary | ICD-10-CM | POA: Diagnosis not present

## 2021-02-07 DIAGNOSIS — Z8719 Personal history of other diseases of the digestive system: Secondary | ICD-10-CM | POA: Insufficient documentation

## 2021-02-07 DIAGNOSIS — Z888 Allergy status to other drugs, medicaments and biological substances status: Secondary | ICD-10-CM | POA: Diagnosis not present

## 2021-02-07 DIAGNOSIS — Z886 Allergy status to analgesic agent status: Secondary | ICD-10-CM | POA: Insufficient documentation

## 2021-02-07 DIAGNOSIS — I4891 Unspecified atrial fibrillation: Secondary | ICD-10-CM | POA: Diagnosis not present

## 2021-02-07 DIAGNOSIS — Z880 Allergy status to penicillin: Secondary | ICD-10-CM | POA: Insufficient documentation

## 2021-02-07 DIAGNOSIS — C3490 Malignant neoplasm of unspecified part of unspecified bronchus or lung: Secondary | ICD-10-CM

## 2021-02-07 LAB — CMP (CANCER CENTER ONLY)
ALT: 14 U/L (ref 0–44)
AST: 26 U/L (ref 15–41)
Albumin: 3.6 g/dL (ref 3.5–5.0)
Alkaline Phosphatase: 75 U/L (ref 38–126)
Anion gap: 7 (ref 5–15)
BUN: 18 mg/dL (ref 8–23)
CO2: 30 mmol/L (ref 22–32)
Calcium: 9.3 mg/dL (ref 8.9–10.3)
Chloride: 106 mmol/L (ref 98–111)
Creatinine: 0.8 mg/dL (ref 0.44–1.00)
GFR, Estimated: >60 mL/min (ref >60)
Glucose, Bld: 108 mg/dL — ABNORMAL HIGH (ref 70–99)
Potassium: 4.3 mmol/L (ref 3.5–5.1)
Sodium: 143 mmol/L (ref 135–145)
Total Bilirubin: 0.6 mg/dL (ref 0.3–1.2)
Total Protein: 6.8 g/dL (ref 6.5–8.1)

## 2021-02-07 LAB — CBC WITH DIFFERENTIAL (CANCER CENTER ONLY)
Abs Immature Granulocytes: 0.01 10*3/uL (ref 0.00–0.07)
Basophils Absolute: 0 10*3/uL (ref 0.0–0.1)
Basophils Relative: 1 %
Eosinophils Absolute: 0.2 10*3/uL (ref 0.0–0.5)
Eosinophils Relative: 4 %
HCT: 41 % (ref 36.0–46.0)
Hemoglobin: 13.7 g/dL (ref 12.0–15.0)
Immature Granulocytes: 0 %
Lymphocytes Relative: 29 %
Lymphs Abs: 1.3 10*3/uL (ref 0.7–4.0)
MCH: 30.9 pg (ref 26.0–34.0)
MCHC: 33.4 g/dL (ref 30.0–36.0)
MCV: 92.3 fL (ref 80.0–100.0)
Monocytes Absolute: 0.6 10*3/uL (ref 0.1–1.0)
Monocytes Relative: 14 %
Neutro Abs: 2.2 10*3/uL (ref 1.7–7.7)
Neutrophils Relative %: 52 %
Platelet Count: 232 10*3/uL (ref 150–400)
RBC: 4.44 MIL/uL (ref 3.87–5.11)
RDW: 13.8 % (ref 11.5–15.5)
WBC Count: 4.3 10*3/uL (ref 4.0–10.5)
nRBC: 0 % (ref 0.0–0.2)

## 2021-02-07 NOTE — Progress Notes (Signed)
[facility] Telephone:(336) [phone]   Fax:([phone]  OFFICE PROGRESS NOTE  [name], MD [place] [address]  DIAGNOSIS: Recurrent non-small cell lung cancer presented with bilateral lung nodules in October 2016 initially diagnosed as a stage IA adenocarcinoma with positive EGFR mutation with deletion in exon 19 in December 2012.  PRIOR THERAPY: Status post bronchoscopy with right VATS and right upper lobectomy with lymph node dissection under the care of Dr. Servando Snare on 05/31/2011.  CURRENT THERAPY: Iressa 250 mg by mouth daily started 04/17/2015, status post 69 months of treatment.  INTERVAL HISTORY: Natalie Carlson 87 y.o. female returns to the clinic today for 65-monthfollow-up visit.  The patient is feeling fine today with no concerning complaints.  She denied having any current chest pain, shortness of breath, cough or hemoptysis.  She was treated recently for upper respiratory infection with Rocephin intramuscular injection and cephalosporin.  The patient denied having any current nausea, vomiting, diarrhea or constipation.  She has no headache or visual changes.  She is here today for evaluation and repeat blood work.  MEDICAL HISTORY: Past Medical History:  Diagnosis Date   Arthritis    Atrial fibrillation (HCC)    Cataract of both eyes    Chronic back pain    Chronic diarrhea    Colitis    Hemorrhoids    History of colon polyps    History of migraines    Hyperlipidemia    Hypothyroidism    Macular degeneration, dry    Malignant neoplasm of right upper lobe of lung (HCC)    Stage 1A  EGFR positive  Deletion in exon 19.  ALK-negative  Right Upper Lobe Lung Mass  SUV 2.9 on PET Status post VATS and right upper lobe lobectomy 05/31/2011   Mucus in stool    Nephrolithiasis    103/19/94- passed on its on    ALLERGIES:  is allergic to doxycycline, entocort ec [budesonide], neurontin [gabapentin], vicodin [hydrocodone-acetaminophen],  adhesive [tape], amoxicillin, chocolate, codeine, levaquin [levofloxacin in d5w], morphine and related, naprosyn [naproxen], penicillins, prednisone, tramadol, and uncoded nonscreenable allergen.  MEDICATIONS:  Current Outpatient Medications  Medication Sig Dispense Refill   acetaminophen (TYLENOL) 500 MG tablet Take 500 mg by mouth. Patient takes as needed , which is seldom.     Calcium Carbonate (CALCIUM 600 PO) Take 1 tablet by mouth 2 (two) times daily.     diclofenac sodium (VOLTAREN) 1 % GEL Apply 1 application topically 2 (two) times daily as needed (for neck pain).      diltiazem (CARDIZEM CD) 180 MG 24 hr capsule TAKE ONE CAPSULE BY MOUTH EVERY DAY 90 capsule 2   furosemide (LASIX) 20 MG tablet TAKE 1 TABLET BY MOUTH EVERY DAY AS NEEDED FOR FLUID 30 tablet 1   gefitinib (IRESSA) 250 MG tablet Take 1 tablet (250 mg total) by mouth daily. 30 tablet 5   HYDROmorphone (DILAUDID) 4 MG tablet Take 1 tablet (4 mg total) by mouth every 6 (six) hours as needed for severe pain. 20 tablet 0   loperamide (IMODIUM A-D) 2 MG tablet Take 1 mg by mouth daily as needed for diarrhea or loose stools.     LUTEIN PO Take 1 capsule by mouth daily. Reported on 10/27/2015     magic mouthwash SOLN Take 5 mLs by mouth 4 (four) times daily as needed for mouth pain. 240 mL 0   magnesium 30 MG tablet Take 30 mg  by mouth at bedtime.      mesalamine (APRISO) 0.375 g 24 hr capsule TAKE 2 CAPSULE BY MOUTH TWICE DAILY 120 capsule 11   Multiple Vitamins-Minerals (AIRBORNE) CHEW Chew 1 tablet by mouth daily.     Multiple Vitamins-Minerals (HAIR/SKIN/NAILS) TABS Take 1 tablet by mouth daily.     Multiple Vitamins-Minerals (PRESERVISION AREDS 2 PO) Take 1 tablet by mouth 2 (two) times daily with a meal.     NON FORMULARY Eye In jection every 14 weeks. Both eyes are injected. Last injection was 02/23/2020.     Omega-3 Fatty Acids (FISH OIL) 1200 MG CAPS Take 1,200 mg by mouth daily.     ondansetron (ZOFRAN ODT) 4 MG  disintegrating tablet Take 1 tablet (4 mg total) by mouth every 8 (eight) hours as needed. 10 tablet 1   potassium chloride (KLOR-CON) 10 MEQ tablet TAKE 1 TABLET BY MOUTH EVERY DAY 30 tablet 1   warfarin (COUMADIN) 5 MG tablet TAKE 1 TABLET BY MOUTH DAILY EXCEPT TAKE ONE AND 1/2 TABLETS ON  WEDNESDAYS AND SATURDAYS. 40 tablet 6   No current facility-administered medications for this visit.    SURGICAL HISTORY:  Past Surgical History:  Procedure Laterality Date   Bladder tack  1996   CARDIOVERSION  11/02/09   x 2   CATARACT EXTRACTION  2010/2011   COLONOSCOPY  3 YRS AGO   COLONOSCOPY  03/06/2011   Procedure: COLONOSCOPY;  Surgeon: Rogene Houston, MD;  Location: [facility];  Service: Endoscopy;  Laterality: N/A;  7:30 am   ESOPHAGOGASTRODUODENOSCOPY     FLEXIBLE SIGMOIDOSCOPY N/A 10/04/2012   Procedure: FLEXIBLE SIGMOIDOSCOPY;  Surgeon: Rogene Houston, MD;  Location: [facility];  Service: Endoscopy;  Laterality: N/A;  11:00-moved to 1015 Ann to notify pt   THYROID SURGERY  1961   TONSILLECTOMY     VIDEO BRONCHOSCOPY  05/31/2011   Procedure: VIDEO BRONCHOSCOPY;  Surgeon: Grace Isaac, MD;  Location: [facility] OR;  Service: Thoracic;  Laterality: N/A;    REVIEW OF SYSTEMS:  A comprehensive review of systems was negative.   PHYSICAL EXAMINATION: General appearance: alert, cooperative, and no distress Head: Normocephalic, without obvious abnormality, atraumatic Neck: no adenopathy, no JVD, supple, symmetrical, trachea midline, and thyroid not enlarged, symmetric, no tenderness/mass/nodules Lymph nodes: Cervical, supraclavicular, and axillary nodes normal. Resp: clear to auscultation bilaterally Back: symmetric, no curvature. ROM normal. No CVA tenderness. Cardio: regular rate and rhythm, S1, S2 normal, no murmur, click, rub or gallop GI: soft, non-tender; bowel sounds normal; no masses,  no organomegaly Extremities: extremities normal, atraumatic, no cyanosis or edema  ECOG  PERFORMANCE STATUS: 0 - Asymptomatic  Blood pressure (!) 142/82, pulse 75, temperature 98 F (36.7 C), temperature source Tympanic, resp. rate 17, weight 95 lb 9 oz (43.3 kg), SpO2 99 %.  LABORATORY DATA: Lab Results  Component Value Date   WBC 4.3 02/07/2021   HGB 13.7 02/07/2021   HCT 41.0 02/07/2021   MCV 92.3 02/07/2021   PLT 232 02/07/2021      Chemistry      Component Value Date/Time   NA 142 12/07/2020 1508   NA 141 05/14/2017 1304   K 5.3 (H) 12/07/2020 1508   K 4.3 05/14/2017 1304   CL 105 12/07/2020 1508   CO2 32 12/07/2020 1508   CO2 30 (H) 05/14/2017 1304   BUN 19 12/07/2020 1508   BUN 19.0 05/14/2017 1304   CREATININE 0.90 12/07/2020 1508   CREATININE 0.8 05/14/2017 1304  Component Value Date/Time   CALCIUM 9.4 12/07/2020 1508   CALCIUM 9.1 05/14/2017 1304   ALKPHOS 66 12/07/2020 1508   ALKPHOS 86 05/14/2017 1304   AST 29 12/07/2020 1508   AST 25 05/14/2017 1304   ALT 15 12/07/2020 1508   ALT 15 05/14/2017 1304   BILITOT 0.6 12/07/2020 1508   BILITOT 0.57 05/14/2017 1304       RADIOGRAPHIC STUDIES: No results found.   ASSESSMENT AND PLAN:  This is a very pleasant 87 years old white female with recurrent non-small cell lung cancer, adenocarcinoma with positive EGFR mutation. The patient has been on treatment with Iressa 250 mg p.o. daily for the last 69 months. The patient continues to tolerate her treatment with Iressa fairly well. I recommended for her to continue her current treatment with Iressa with the same dose. I will see her back for follow-up visit in 2 months for evaluation with repeat CT scan of the chest for restaging of her disease. The patient was advised to call immediately if she has any other concerning symptoms in the interval. The patient voices understanding of current disease status and treatment options and is in agreement with the current care plan. All questions were answered. The patient knows to call the clinic with  any problems, questions or concerns. We can certainly see the patient much sooner if necessary.  Disclaimer: This note was dictated with voice recognition software. Similar sounding words can inadvertently be transcribed and may not be corrected upon review.

## 2021-02-10 DIAGNOSIS — Z1329 Encounter for screening for other suspected endocrine disorder: Secondary | ICD-10-CM | POA: Diagnosis not present

## 2021-02-10 DIAGNOSIS — Z0001 Encounter for general adult medical examination with abnormal findings: Secondary | ICD-10-CM | POA: Diagnosis not present

## 2021-02-10 DIAGNOSIS — Z681 Body mass index (BMI) 19 or less, adult: Secondary | ICD-10-CM | POA: Diagnosis not present

## 2021-02-10 DIAGNOSIS — I482 Chronic atrial fibrillation, unspecified: Secondary | ICD-10-CM | POA: Diagnosis not present

## 2021-02-10 DIAGNOSIS — I5032 Chronic diastolic (congestive) heart failure: Secondary | ICD-10-CM | POA: Diagnosis not present

## 2021-02-10 DIAGNOSIS — R531 Weakness: Secondary | ICD-10-CM | POA: Diagnosis not present

## 2021-02-10 DIAGNOSIS — M810 Age-related osteoporosis without current pathological fracture: Secondary | ICD-10-CM | POA: Diagnosis not present

## 2021-02-14 DIAGNOSIS — I700 Atherosclerosis of aorta: Secondary | ICD-10-CM | POA: Diagnosis not present

## 2021-02-14 DIAGNOSIS — H353290 Exudative age-related macular degeneration, unspecified eye, stage unspecified: Secondary | ICD-10-CM | POA: Diagnosis not present

## 2021-02-14 DIAGNOSIS — I5032 Chronic diastolic (congestive) heart failure: Secondary | ICD-10-CM | POA: Diagnosis not present

## 2021-02-14 DIAGNOSIS — K5100 Ulcerative (chronic) pancolitis without complications: Secondary | ICD-10-CM | POA: Diagnosis not present

## 2021-02-14 DIAGNOSIS — R4582 Worries: Secondary | ICD-10-CM | POA: Diagnosis not present

## 2021-02-14 DIAGNOSIS — C3411 Malignant neoplasm of upper lobe, right bronchus or lung: Secondary | ICD-10-CM | POA: Diagnosis not present

## 2021-02-14 DIAGNOSIS — H35342 Macular cyst, hole, or pseudohole, left eye: Secondary | ICD-10-CM | POA: Diagnosis not present

## 2021-02-16 ENCOUNTER — Ambulatory Visit (INDEPENDENT_AMBULATORY_CARE_PROVIDER_SITE_OTHER): Payer: Medicare Other | Admitting: *Deleted

## 2021-02-16 DIAGNOSIS — Z5181 Encounter for therapeutic drug level monitoring: Secondary | ICD-10-CM | POA: Diagnosis not present

## 2021-02-16 DIAGNOSIS — I4891 Unspecified atrial fibrillation: Secondary | ICD-10-CM

## 2021-02-16 LAB — POCT INR: INR: 4.4 — AB (ref 2.0–3.0)

## 2021-02-16 NOTE — Patient Instructions (Signed)
Hold warfarin today, take 1/2 tablet tomorrow then resume 1 tablet daily except 1 1/2 tablets on Wednesdays and Saturdays Recheck in 2 wks

## 2021-02-18 ENCOUNTER — Telehealth: Payer: Self-pay | Admitting: Internal Medicine

## 2021-02-18 NOTE — Telephone Encounter (Signed)
Cancelled upcoming appointment per patient's request. Patient to schedule lab/scan at [facility]. Patient is aware of changes.

## 2021-02-21 DIAGNOSIS — H353231 Exudative age-related macular degeneration, bilateral, with active choroidal neovascularization: Secondary | ICD-10-CM | POA: Diagnosis not present

## 2021-02-21 DIAGNOSIS — H43391 Other vitreous opacities, right eye: Secondary | ICD-10-CM | POA: Diagnosis not present

## 2021-02-21 DIAGNOSIS — H35371 Puckering of macula, right eye: Secondary | ICD-10-CM | POA: Diagnosis not present

## 2021-02-21 DIAGNOSIS — H43813 Vitreous degeneration, bilateral: Secondary | ICD-10-CM | POA: Diagnosis not present

## 2021-02-24 ENCOUNTER — Other Ambulatory Visit (INDEPENDENT_AMBULATORY_CARE_PROVIDER_SITE_OTHER): Payer: Self-pay | Admitting: Internal Medicine

## 2021-02-24 NOTE — Telephone Encounter (Signed)
Last seen 02/24/20. No upcoming appt

## 2021-03-01 DIAGNOSIS — Z20828 Contact with and (suspected) exposure to other viral communicable diseases: Secondary | ICD-10-CM | POA: Diagnosis not present

## 2021-03-03 ENCOUNTER — Ambulatory Visit (INDEPENDENT_AMBULATORY_CARE_PROVIDER_SITE_OTHER): Payer: Medicare Other | Admitting: *Deleted

## 2021-03-03 DIAGNOSIS — Z5181 Encounter for therapeutic drug level monitoring: Secondary | ICD-10-CM

## 2021-03-03 DIAGNOSIS — I4891 Unspecified atrial fibrillation: Secondary | ICD-10-CM | POA: Diagnosis not present

## 2021-03-03 LAB — POCT INR: INR: 4.1 — AB (ref 2.0–3.0)

## 2021-03-03 NOTE — Patient Instructions (Signed)
Hold warfarin today then decrease dose to 1 tablet daily Recheck in 3 wks

## 2021-03-07 DIAGNOSIS — Z23 Encounter for immunization: Secondary | ICD-10-CM | POA: Diagnosis not present

## 2021-03-23 ENCOUNTER — Ambulatory Visit (INDEPENDENT_AMBULATORY_CARE_PROVIDER_SITE_OTHER): Payer: Medicare Other | Admitting: *Deleted

## 2021-03-23 DIAGNOSIS — Z5181 Encounter for therapeutic drug level monitoring: Secondary | ICD-10-CM

## 2021-03-23 DIAGNOSIS — I4891 Unspecified atrial fibrillation: Secondary | ICD-10-CM | POA: Diagnosis not present

## 2021-03-23 LAB — POCT INR: INR: 2.1 (ref 2.0–3.0)

## 2021-03-23 NOTE — Patient Instructions (Signed)
Continue warfarin 1 tablet daily Recheck in 4 wks

## 2021-04-02 DIAGNOSIS — Z20828 Contact with and (suspected) exposure to other viral communicable diseases: Secondary | ICD-10-CM | POA: Diagnosis not present

## 2021-04-04 ENCOUNTER — Other Ambulatory Visit (HOSPITAL_COMMUNITY)
Admission: RE | Admit: 2021-04-04 | Discharge: 2021-04-04 | Disposition: A | Discharge status: Home / Self Care | Payer: Medicare Other | Source: Ambulatory Visit | Attending: Internal Medicine | Admitting: Internal Medicine

## 2021-04-04 DIAGNOSIS — C3490 Malignant neoplasm of unspecified part of unspecified bronchus or lung: Secondary | ICD-10-CM | POA: Diagnosis not present

## 2021-04-04 LAB — CBC WITH DIFFERENTIAL/PLATELET
Abs Immature Granulocytes: 0.01 10*3/uL (ref 0.00–0.07)
Basophils Absolute: 0 10*3/uL (ref 0.0–0.1)
Basophils Relative: 1 %
Eosinophils Absolute: 0.2 10*3/uL (ref 0.0–0.5)
Eosinophils Relative: 4 %
HCT: 42.5 % (ref 36.0–46.0)
Hemoglobin: 13.7 g/dL (ref 12.0–15.0)
Immature Granulocytes: 0 %
Lymphocytes Relative: 24 %
Lymphs Abs: 1.1 10*3/uL (ref 0.7–4.0)
MCH: 30.4 pg (ref 26.0–34.0)
MCHC: 32.2 g/dL (ref 30.0–36.0)
MCV: 94.2 fL (ref 80.0–100.0)
Monocytes Absolute: 0.8 10*3/uL (ref 0.1–1.0)
Monocytes Relative: 17 %
Neutro Abs: 2.5 10*3/uL (ref 1.7–7.7)
Neutrophils Relative %: 54 %
Platelets: 221 10*3/uL (ref 150–400)
RBC: 4.51 MIL/uL (ref 3.87–5.11)
RDW: 14.1 % (ref 11.5–15.5)
WBC: 4.7 10*3/uL (ref 4.0–10.5)
nRBC: 0 % (ref 0.0–0.2)

## 2021-04-06 ENCOUNTER — Other Ambulatory Visit: Payer: Medicare Other

## 2021-04-08 ENCOUNTER — Ambulatory Visit (HOSPITAL_COMMUNITY)
Admission: RE | Admit: 2021-04-08 | Discharge: 2021-04-08 | Disposition: A | Discharge status: Home / Self Care | Payer: Medicare Other | Source: Ambulatory Visit | Attending: Internal Medicine | Admitting: Internal Medicine

## 2021-04-08 ENCOUNTER — Other Ambulatory Visit: Payer: Self-pay

## 2021-04-08 DIAGNOSIS — C3490 Malignant neoplasm of unspecified part of unspecified bronchus or lung: Secondary | ICD-10-CM | POA: Insufficient documentation

## 2021-04-08 DIAGNOSIS — R911 Solitary pulmonary nodule: Secondary | ICD-10-CM | POA: Diagnosis not present

## 2021-04-08 DIAGNOSIS — R918 Other nonspecific abnormal finding of lung field: Secondary | ICD-10-CM | POA: Diagnosis not present

## 2021-04-08 DIAGNOSIS — I700 Atherosclerosis of aorta: Secondary | ICD-10-CM | POA: Diagnosis not present

## 2021-04-08 LAB — POCT I-STAT CREATININE: Creatinine, Ser: 0.9 mg/dL (ref 0.44–1.00)

## 2021-04-08 MED ORDER — IOHEXOL 300 MG/ML  SOLN
75.0000 mL | Freq: Once | INTRAMUSCULAR | Status: AC | PRN
  Administered 2021-04-08: 75 mL via INTRAVENOUS

## 2021-04-12 ENCOUNTER — Other Ambulatory Visit: Payer: Self-pay

## 2021-04-12 ENCOUNTER — Inpatient Hospital Stay: Payer: Medicare Other | Attending: Internal Medicine | Admitting: Internal Medicine

## 2021-04-12 VITALS — BP 136/76 | HR 85 | Temp 97.9°F | Resp 16 | Ht 63.0 in | Wt 97.1 lb

## 2021-04-12 DIAGNOSIS — I700 Atherosclerosis of aorta: Secondary | ICD-10-CM | POA: Insufficient documentation

## 2021-04-12 DIAGNOSIS — C3411 Malignant neoplasm of upper lobe, right bronchus or lung: Secondary | ICD-10-CM | POA: Diagnosis not present

## 2021-04-12 DIAGNOSIS — Z888 Allergy status to other drugs, medicaments and biological substances status: Secondary | ICD-10-CM | POA: Insufficient documentation

## 2021-04-12 DIAGNOSIS — C3490 Malignant neoplasm of unspecified part of unspecified bronchus or lung: Secondary | ICD-10-CM

## 2021-04-12 DIAGNOSIS — Z881 Allergy status to other antibiotic agents status: Secondary | ICD-10-CM | POA: Insufficient documentation

## 2021-04-12 DIAGNOSIS — Z880 Allergy status to penicillin: Secondary | ICD-10-CM | POA: Diagnosis not present

## 2021-04-12 DIAGNOSIS — Z885 Allergy status to narcotic agent status: Secondary | ICD-10-CM | POA: Insufficient documentation

## 2021-04-12 DIAGNOSIS — Z902 Acquired absence of lung [part of]: Secondary | ICD-10-CM | POA: Diagnosis not present

## 2021-04-12 DIAGNOSIS — Z79899 Other long term (current) drug therapy: Secondary | ICD-10-CM | POA: Insufficient documentation

## 2021-04-12 DIAGNOSIS — Z7901 Long term (current) use of anticoagulants: Secondary | ICD-10-CM | POA: Diagnosis not present

## 2021-04-12 DIAGNOSIS — I2510 Atherosclerotic heart disease of native coronary artery without angina pectoris: Secondary | ICD-10-CM | POA: Insufficient documentation

## 2021-04-12 DIAGNOSIS — Z5181 Encounter for therapeutic drug level monitoring: Secondary | ICD-10-CM

## 2021-04-12 DIAGNOSIS — Z8719 Personal history of other diseases of the digestive system: Secondary | ICD-10-CM | POA: Insufficient documentation

## 2021-04-12 DIAGNOSIS — I4891 Unspecified atrial fibrillation: Secondary | ICD-10-CM | POA: Insufficient documentation

## 2021-04-12 DIAGNOSIS — Z886 Allergy status to analgesic agent status: Secondary | ICD-10-CM | POA: Diagnosis not present

## 2021-04-12 MED ORDER — IRESSA 250 MG PO TABS: 250.0000 mg | ORAL_TABLET | Freq: Every day | ORAL | 5 refills | Status: DC

## 2021-04-12 NOTE — Progress Notes (Signed)
Natalie Carlson:(336) [phone]   Fax:(336) [phone]  OFFICE PROGRESS NOTE  Natalie Bis, MD [place] [address]  DIAGNOSIS: Recurrent non-small cell lung cancer presented with bilateral lung nodules in October 2016 initially diagnosed as a stage IA adenocarcinoma with positive EGFR mutation with deletion in exon 33 in December 2012.  PRIOR THERAPY: Status post bronchoscopy with right VATS and right upper lobectomy with lymph node dissection under the care of Dr. Servando Snare on 05/31/2011.  CURRENT THERAPY: Iressa 250 mg by mouth daily started 04/17/2015, status post 71 months of treatment.  INTERVAL HISTORY: Natalie Carlson returns to the clinic today for follow-up visit.  The patient is feeling fine today with no concerning complaints.  She denied having any chest pain, shortness of breath, cough or hemoptysis.  She denied having any fever or chills.  She has no nausea, vomiting, diarrhea or constipation.  She has no headache or visual changes.  She denied having any significant weight loss or night sweats.  She has been tolerating her treatment with Tagrisso fairly well.  The patient had repeat CT scan of the chest performed recently and she is here for evaluation and discussion of her scan results.  MEDICAL HISTORY: Past Medical History:  Diagnosis Date   Arthritis    Atrial fibrillation (HCC)    Cataract of both eyes    Chronic back pain    Chronic diarrhea    Colitis    Hemorrhoids    History of colon polyps    History of migraines    Hyperlipidemia    Hypothyroidism    Macular degeneration, dry    Malignant neoplasm of right upper lobe of lung (HCC)    Stage 1A  EGFR positive  Deletion in exon 19.  ALK-negative  Right Upper Lobe Lung Mass  SUV 2.9 on PET Status post VATS and right upper lobe lobectomy 05/31/2011   Mucus in stool    Nephrolithiasis    Aug 01, 1992 - passed on its on    ALLERGIES:  is allergic to doxycycline,  entocort ec [budesonide], neurontin [gabapentin], vicodin [hydrocodone-acetaminophen], adhesive [tape], amoxicillin, chocolate, codeine, levaquin [levofloxacin in d5w], morphine and related, naprosyn [naproxen], penicillins, prednisone, tramadol, and uncoded nonscreenable allergen.  MEDICATIONS:  Current Outpatient Medications  Medication Sig Dispense Refill   acetaminophen (TYLENOL) 500 MG tablet Take 500 mg by mouth. Patient takes as needed , which is seldom.     Calcium Carbonate (CALCIUM 600 PO) Take 1 tablet by mouth 2 (two) times daily.     diclofenac sodium (VOLTAREN) 1 % GEL Apply 1 application topically 2 (two) times daily as needed (for neck pain).      diltiazem (CARDIZEM CD) 180 MG 24 hr capsule TAKE ONE CAPSULE BY MOUTH EVERY DAY 90 capsule 2   furosemide (LASIX) 20 MG tablet TAKE 1 TABLET BY MOUTH EVERY DAY AS NEEDED FOR FLUID 30 tablet 1   gefitinib (IRESSA) 250 MG tablet Take 1 tablet (250 mg total) by mouth daily. 30 tablet 5   HYDROmorphone (DILAUDID) 4 MG tablet Take 1 tablet (4 mg total) by mouth every 6 (six) hours as needed for severe pain. 20 tablet 0   loperamide (IMODIUM A-D) 2 MG tablet Take 1 mg by mouth daily as needed for diarrhea or loose stools.     LUTEIN PO Take 1 capsule by mouth daily. Reported on 10/27/2015     magic mouthwash SOLN Take 5 mLs  by mouth 4 (four) times daily as needed for mouth pain. 240 mL 0   magnesium 30 MG tablet Take 30 mg by mouth at bedtime.      mesalamine (APRISO) 0.375 g 24 hr capsule TAKE TWO CAPSULES BY MOUTH TWICE DAILY 120 capsule 11   Multiple Vitamins-Minerals (AIRBORNE) CHEW Chew 1 tablet by mouth daily.     Multiple Vitamins-Minerals (HAIR/SKIN/NAILS) TABS Take 1 tablet by mouth daily.     Multiple Vitamins-Minerals (PRESERVISION AREDS 2 PO) Take 1 tablet by mouth 2 (two) times daily with a meal.     NON FORMULARY Eye In jection every 14 weeks. Both eyes are injected. Last injection was 02/23/2020.     Omega-3 Fatty Acids (FISH  OIL) 1200 MG CAPS Take 1,200 mg by mouth daily.     ondansetron (ZOFRAN ODT) 4 MG disintegrating tablet Take 1 tablet (4 mg total) by mouth every 8 (eight) hours as needed. 10 tablet 1   potassium chloride (KLOR-CON) 10 MEQ tablet TAKE 1 TABLET BY MOUTH EVERY DAY 30 tablet 1   warfarin (COUMADIN) 5 MG tablet TAKE 1 TABLET BY MOUTH DAILY EXCEPT TAKE ONE AND 1/2 TABLETS ON  WEDNESDAYS AND SATURDAYS. 40 tablet 6   No current facility-administered medications for this visit.    SURGICAL HISTORY:  Past Surgical History:  Procedure Laterality Date   Bladder tack  1996   CARDIOVERSION  11/02/09   x 2   CATARACT EXTRACTION  2010/2011   COLONOSCOPY  3 YRS AGO   COLONOSCOPY  03/06/2011   Procedure: COLONOSCOPY;  Surgeon: Rogene Houston, MD;  [facility]: [facility];  Service: Endoscopy;  Laterality: N/A;  7:30 am   ESOPHAGOGASTRODUODENOSCOPY     FLEXIBLE SIGMOIDOSCOPY N/A 10/04/2012   Procedure: FLEXIBLE SIGMOIDOSCOPY;  Surgeon: Rogene Houston, MD;  Location: [facility];  Service: Endoscopy;  Laterality: N/A;  11:00-moved to 1015 Ann to notify pt   THYROID SURGERY  1961   TONSILLECTOMY     VIDEO BRONCHOSCOPY  05/31/2011   Procedure: VIDEO BRONCHOSCOPY;  Surgeon: Grace Isaac, MD;  Location: [facility] OR;  Service: Thoracic;  Laterality: N/A;    REVIEW OF SYSTEMS:  Constitutional: negative Eyes: negative Ears, nose, mouth, throat, and face: negative Respiratory: negative Cardiovascular: negative Gastrointestinal: negative Genitourinary:negative Integument/breast: negative Hematologic/lymphatic: negative Musculoskeletal:negative Neurological: negative Behavioral/Psych: negative Endocrine: negative Allergic/Immunologic: negative   PHYSICAL EXAMINATION: General appearance: alert, cooperative, and no distress Head: Normocephalic, without obvious abnormality, atraumatic Neck: no adenopathy, no JVD, supple, symmetrical, trachea midline, and thyroid not enlarged, symmetric, no  tenderness/mass/nodules Lymph nodes: Cervical, supraclavicular, and axillary nodes normal. Resp: clear to auscultation bilaterally Back: symmetric, no curvature. ROM normal. No CVA tenderness. Cardio: regular rate and rhythm, S1, S2 normal, no murmur, click, rub or gallop GI: soft, non-tender; bowel sounds normal; no masses,  no organomegaly Extremities: extremities normal, atraumatic, no cyanosis or edema Neurologic: Alert and oriented X 3, normal strength and tone. Normal symmetric reflexes. Normal coordination and gait  ECOG PERFORMANCE STATUS: 0 - Asymptomatic  Blood pressure 136/76, pulse 85, temperature 97.9 F (36.6 C), temperature source Oral, resp. rate 16, height 5' 3"  (1.6 m), weight 97 lb 1.6 oz (44 kg), SpO2 100 %.  LABORATORY DATA: Lab Results  Component Value Date   WBC 4.7 04/04/2021   HGB 13.7 04/04/2021   HCT 42.5 04/04/2021   MCV 94.2 04/04/2021   PLT 221 04/04/2021      Chemistry      Component Value Date/Time  NA 143 02/07/2021 1309   NA 141 05/14/2017 1304   K 4.3 02/07/2021 1309   K 4.3 05/14/2017 1304   CL 106 02/07/2021 1309   CO2 30 02/07/2021 1309   CO2 30 (H) 05/14/2017 1304   BUN 18 02/07/2021 1309   BUN 19.0 05/14/2017 1304   CREATININE 0.90 04/08/2021 1252   CREATININE 0.80 02/07/2021 1309   CREATININE 0.8 05/14/2017 1304      Component Value Date/Time   CALCIUM 9.3 02/07/2021 1309   CALCIUM 9.1 05/14/2017 1304   ALKPHOS 75 02/07/2021 1309   ALKPHOS 86 05/14/2017 1304   AST 26 02/07/2021 1309   AST 25 05/14/2017 1304   ALT 14 02/07/2021 1309   ALT 15 05/14/2017 1304   BILITOT 0.6 02/07/2021 1309   BILITOT 0.57 05/14/2017 1304       RADIOGRAPHIC STUDIES: CT Chest W Contrast  Result Date: 04/09/2021 CLINICAL DATA:  87 year old Carlson with history of non-small cell lung cancer status post right upper lobectomy and lymph node dissection. Ongoing chemotherapy. EXAM: CT CHEST WITH CONTRAST TECHNIQUE: Multidetector CT imaging of the  chest was performed during intravenous contrast administration. CONTRAST:  32m OMNIPAQUE IOHEXOL 300 MG/ML  SOLN COMPARISON:  Chest CT 09/29/2020. FINDINGS: Cardiovascular: Heart size is normal. There is no significant pericardial fluid, thickening or pericardial calcification. There is aortic atherosclerosis, as well as atherosclerosis of the great vessels of the mediastinum and the coronary arteries, including calcified atherosclerotic plaque in the left anterior descending and right coronary arteries. Mediastinum/Nodes: No pathologically enlarged mediastinal or hilar lymph nodes. Esophagus is unremarkable in appearance. No axillary lymphadenopathy. Lungs/Pleura: Status post right upper lobectomy, with compensatory hyperexpansion of the right middle and lower lobes. Multiple small pulmonary nodules and areas of mild pleural nodularity, stable compared to the prior examination, presumably benign. No other new suspicious appearing pulmonary nodules or masses are noted. No acute consolidative airspace disease. No pleural effusions. Scattered areas with mild ground-glass attenuation and septal thickening noted throughout the lungs, predominantly in the periphery, similar to the prior examination. Upper Abdomen: Aortic atherosclerosis. Musculoskeletal: There are no aggressive appearing lytic or blastic lesions noted in the visualized portions of the skeleton. IMPRESSION: 1. Status post right upper lobectomy, with multiple areas of nodularity noted throughout the lungs bilaterally, similar to prior examinations, considered benign. No findings to suggest locally recurrent disease or metastatic disease in the thorax. 2. The appearance of the lungs remains concerning for potential interstitial lung disease, and would be categorized as indeterminate for usual interstitial pneumonia (UIP) per current ATS guidelines. 3. Aortic atherosclerosis, in addition to 2 vessel coronary artery disease. Aortic Atherosclerosis  (ICD10-I70.0). Electronically Signed   By: DVinnie LangtonM.D.   On: 04/09/2021 12:27     ASSESSMENT AND PLAN:  This is a very pleasant 87years old white Carlson with recurrent non-small cell lung cancer, adenocarcinoma with positive EGFR mutation. The patient has been on treatment with Iressa 250 mg p.o. daily for the last 71 months. The patient has been tolerating her treatment with Tagrisso fairly well with no concerning adverse effects. She had repeat CT scan of the chest performed recently.  I personally and independently reviewed the scans and discussed the results with the patient today. Her scan showed no concerning findings for disease progression. I recommended for the patient to continue her current treatment with Aricept with the same dose. I will see her back for follow-up visit in 3 months for evaluation with repeat blood work. She was advised to  call immediately if she has any concerning symptoms in the interval. The patient voices understanding of current disease status and treatment options and is in agreement with the current care plan. All questions were answered. The patient knows to call the clinic with any problems, questions or concerns. We can certainly see the patient much sooner if necessary.  Disclaimer: This note was dictated with voice recognition software. Similar sounding words can inadvertently be transcribed and may not be corrected upon review.

## 2021-04-20 ENCOUNTER — Ambulatory Visit (INDEPENDENT_AMBULATORY_CARE_PROVIDER_SITE_OTHER): Payer: Medicare Other | Admitting: *Deleted

## 2021-04-20 DIAGNOSIS — I4891 Unspecified atrial fibrillation: Secondary | ICD-10-CM

## 2021-04-20 DIAGNOSIS — Z5181 Encounter for therapeutic drug level monitoring: Secondary | ICD-10-CM | POA: Diagnosis not present

## 2021-04-20 LAB — POCT INR: INR: 2.7 (ref 2.0–3.0)

## 2021-04-20 NOTE — Patient Instructions (Signed)
Continue warfarin 1 tablet daily Recheck in 4 wks

## 2021-04-22 ENCOUNTER — Other Ambulatory Visit: Payer: Self-pay | Admitting: Cardiology

## 2021-05-18 ENCOUNTER — Ambulatory Visit (INDEPENDENT_AMBULATORY_CARE_PROVIDER_SITE_OTHER): Payer: Medicare Other | Admitting: *Deleted

## 2021-05-18 ENCOUNTER — Other Ambulatory Visit: Payer: Self-pay

## 2021-05-18 DIAGNOSIS — Z5181 Encounter for therapeutic drug level monitoring: Secondary | ICD-10-CM | POA: Diagnosis not present

## 2021-05-18 DIAGNOSIS — I4891 Unspecified atrial fibrillation: Secondary | ICD-10-CM | POA: Diagnosis not present

## 2021-05-18 LAB — POCT INR: INR: 2.4 (ref 2.0–3.0)

## 2021-05-18 NOTE — Patient Instructions (Signed)
Continue warfarin 1 tablet daily Recheck in 6 wks

## 2021-05-19 ENCOUNTER — Other Ambulatory Visit (HOSPITAL_COMMUNITY): Payer: Self-pay

## 2021-05-23 DIAGNOSIS — Z23 Encounter for immunization: Secondary | ICD-10-CM | POA: Diagnosis not present

## 2021-05-26 ENCOUNTER — Other Ambulatory Visit: Payer: Self-pay | Admitting: Medical Oncology

## 2021-06-14 DIAGNOSIS — H353231 Exudative age-related macular degeneration, bilateral, with active choroidal neovascularization: Secondary | ICD-10-CM | POA: Diagnosis not present

## 2021-06-14 DIAGNOSIS — H43391 Other vitreous opacities, right eye: Secondary | ICD-10-CM | POA: Diagnosis not present

## 2021-06-14 DIAGNOSIS — H43813 Vitreous degeneration, bilateral: Secondary | ICD-10-CM | POA: Diagnosis not present

## 2021-06-14 DIAGNOSIS — H35371 Puckering of macula, right eye: Secondary | ICD-10-CM | POA: Diagnosis not present

## 2021-06-29 ENCOUNTER — Ambulatory Visit (INDEPENDENT_AMBULATORY_CARE_PROVIDER_SITE_OTHER): Payer: Medicare Other | Admitting: *Deleted

## 2021-06-29 DIAGNOSIS — I4891 Unspecified atrial fibrillation: Secondary | ICD-10-CM | POA: Diagnosis not present

## 2021-06-29 DIAGNOSIS — Z5181 Encounter for therapeutic drug level monitoring: Secondary | ICD-10-CM | POA: Diagnosis not present

## 2021-06-29 LAB — POCT INR: INR: 2.5 (ref 2.0–3.0)

## 2021-06-29 NOTE — Patient Instructions (Signed)
Continue warfarin 1 tablet daily Recheck in 6 wks

## 2021-07-12 ENCOUNTER — Encounter: Payer: Self-pay | Admitting: Internal Medicine

## 2021-07-12 ENCOUNTER — Inpatient Hospital Stay: Payer: Medicare Other | Attending: Internal Medicine

## 2021-07-12 ENCOUNTER — Other Ambulatory Visit: Payer: Self-pay

## 2021-07-12 ENCOUNTER — Inpatient Hospital Stay (HOSPITAL_BASED_OUTPATIENT_CLINIC_OR_DEPARTMENT_OTHER): Payer: Medicare Other | Admitting: Internal Medicine

## 2021-07-12 VITALS — BP 151/83 | HR 85 | Temp 97.4°F | Resp 17 | Ht 63.0 in | Wt 95.7 lb

## 2021-07-12 DIAGNOSIS — I4891 Unspecified atrial fibrillation: Secondary | ICD-10-CM | POA: Diagnosis not present

## 2021-07-12 DIAGNOSIS — C3490 Malignant neoplasm of unspecified part of unspecified bronchus or lung: Secondary | ICD-10-CM

## 2021-07-12 DIAGNOSIS — Z79899 Other long term (current) drug therapy: Secondary | ICD-10-CM | POA: Insufficient documentation

## 2021-07-12 DIAGNOSIS — C3411 Malignant neoplasm of upper lobe, right bronchus or lung: Secondary | ICD-10-CM

## 2021-07-12 DIAGNOSIS — Z7901 Long term (current) use of anticoagulants: Secondary | ICD-10-CM | POA: Insufficient documentation

## 2021-07-12 DIAGNOSIS — Z880 Allergy status to penicillin: Secondary | ICD-10-CM | POA: Insufficient documentation

## 2021-07-12 DIAGNOSIS — Z8719 Personal history of other diseases of the digestive system: Secondary | ICD-10-CM | POA: Diagnosis not present

## 2021-07-12 DIAGNOSIS — Z881 Allergy status to other antibiotic agents status: Secondary | ICD-10-CM | POA: Diagnosis not present

## 2021-07-12 DIAGNOSIS — K1379 Other lesions of oral mucosa: Secondary | ICD-10-CM | POA: Diagnosis not present

## 2021-07-12 DIAGNOSIS — Z5181 Encounter for therapeutic drug level monitoring: Secondary | ICD-10-CM

## 2021-07-12 DIAGNOSIS — K121 Other forms of stomatitis: Secondary | ICD-10-CM

## 2021-07-12 DIAGNOSIS — Z886 Allergy status to analgesic agent status: Secondary | ICD-10-CM | POA: Insufficient documentation

## 2021-07-12 DIAGNOSIS — Z885 Allergy status to narcotic agent status: Secondary | ICD-10-CM | POA: Insufficient documentation

## 2021-07-12 DIAGNOSIS — Z888 Allergy status to other drugs, medicaments and biological substances status: Secondary | ICD-10-CM | POA: Diagnosis not present

## 2021-07-12 LAB — CMP (CANCER CENTER ONLY)
ALT: 16 U/L (ref 0–44)
AST: 26 U/L (ref 15–41)
Albumin: 4.1 g/dL (ref 3.5–5.0)
Alkaline Phosphatase: 90 U/L (ref 38–126)
Anion gap: 5 (ref 5–15)
BUN: 18 mg/dL (ref 8–23)
CO2: 31 mmol/L (ref 22–32)
Calcium: 9.4 mg/dL (ref 8.9–10.3)
Chloride: 103 mmol/L (ref 98–111)
Creatinine: 0.78 mg/dL (ref 0.44–1.00)
GFR, Estimated: >60 mL/min (ref >60)
Glucose, Bld: 98 mg/dL (ref 70–99)
Potassium: 4.3 mmol/L (ref 3.5–5.1)
Sodium: 139 mmol/L (ref 135–145)
Total Bilirubin: 0.8 mg/dL (ref 0.3–1.2)
Total Protein: 7.2 g/dL (ref 6.5–8.1)

## 2021-07-12 LAB — CBC WITH DIFFERENTIAL (CANCER CENTER ONLY)
Abs Immature Granulocytes: 0.03 10*3/uL (ref 0.00–0.07)
Basophils Absolute: 0 10*3/uL (ref 0.0–0.1)
Basophils Relative: 1 %
Eosinophils Absolute: 0.1 10*3/uL (ref 0.0–0.5)
Eosinophils Relative: 2 %
HCT: 42.6 % (ref 36.0–46.0)
Hemoglobin: 13.9 g/dL (ref 12.0–15.0)
Immature Granulocytes: 1 %
Lymphocytes Relative: 24 %
Lymphs Abs: 0.9 10*3/uL (ref 0.7–4.0)
MCH: 30.2 pg (ref 26.0–34.0)
MCHC: 32.6 g/dL (ref 30.0–36.0)
MCV: 92.6 fL (ref 80.0–100.0)
Monocytes Absolute: 0.5 10*3/uL (ref 0.1–1.0)
Monocytes Relative: 13 %
Neutro Abs: 2.3 10*3/uL (ref 1.7–7.7)
Neutrophils Relative %: 59 %
Platelet Count: 194 10*3/uL (ref 150–400)
RBC: 4.6 MIL/uL (ref 3.87–5.11)
RDW: 13.7 % (ref 11.5–15.5)
WBC Count: 3.9 10*3/uL — ABNORMAL LOW (ref 4.0–10.5)
nRBC: 0 % (ref 0.0–0.2)

## 2021-07-12 MED ORDER — MAGIC MOUTHWASH: 5.0000 mL | Freq: Four times a day (QID) | ORAL | 0 refills | Status: DC | PRN

## 2021-07-12 MED ORDER — IRESSA 250 MG PO TABS: 250.0000 mg | ORAL_TABLET | Freq: Every day | ORAL | 5 refills | Status: DC

## 2021-07-12 NOTE — Progress Notes (Signed)
[facility] [phone]) [phone]   [phone]  OFFICE PROGRESS NOTE  [name], MD [place] [address]  DIAGNOSIS: Recurrent non-small cell lung cancer presented with bilateral lung nodules in October 2016 initially diagnosed as a stage IA adenocarcinoma with positive EGFR mutation with deletion in exon 77 in December 2012.  PRIOR THERAPY: Status post bronchoscopy with right VATS and right upper lobectomy with lymph node dissection under the care of Dr. Servando Snare on 05/31/2011.  CURRENT THERAPY: Iressa 250 mg by mouth daily started 04/17/2015, status post 74 months of treatment.  INTERVAL HISTORY: Natalie Carlson 88 y.o. female returns to the clinic today for follow-up visit.  The patient is feeling fine today with no concerning issues except for sore mouth and she is requesting refill of the Magic mouthwash.  She denied having any current chest pain, shortness of breath, cough or hemoptysis.  She has no nausea, vomiting, diarrhea or constipation.  She has no skin rash.  She denied having any recent weight loss or night sweats.  She continues to tolerate her treatment with Aricept fairly well.  She is here for evaluation and repeat blood work.   MEDICAL HISTORY: Past Medical History:  Diagnosis Date   Arthritis    Atrial fibrillation (HCC)    Cataract of both eyes    Chronic back pain    Chronic diarrhea    Colitis    Hemorrhoids    History of colon polyps    History of migraines    Hyperlipidemia    Hypothyroidism    Macular degeneration, dry    Malignant neoplasm of right upper lobe of lung (HCC)    Stage 1A  EGFR positive  Deletion in exon 19.  ALK-negative  Right Upper Lobe Lung Mass  SUV 2.9 on PET Status post VATS and right upper lobe lobectomy 05/31/2011   Mucus in stool    Nephrolithiasis    06-Jul-1992 - passed on its on    ALLERGIES:  is allergic to doxycycline, entocort ec [budesonide], neurontin [gabapentin], vicodin  [hydrocodone-acetaminophen], adhesive [tape], amoxicillin, chocolate, codeine, levaquin [levofloxacin in d5w], morphine and related, naprosyn [naproxen], penicillins, prednisone, tramadol, and uncoded nonscreenable allergen.  MEDICATIONS:  Current Outpatient Medications  Medication Sig Dispense Refill   acetaminophen (TYLENOL) 500 MG tablet Take 500 mg by mouth. Patient takes as needed , which is seldom.     Calcium Carbonate (CALCIUM 600 PO) Take 1 tablet by mouth 2 (two) times daily.     diclofenac sodium (VOLTAREN) 1 % GEL Apply 1 application topically 2 (two) times daily as needed (for neck pain).      diltiazem (CARDIZEM CD) 180 MG 24 hr capsule TAKE ONE CAPSULE BY MOUTH EVERY DAY 90 capsule 2   furosemide (LASIX) 20 MG tablet TAKE 1 TABLET BY MOUTH EVERY DAY AS NEEDED FOR FLUID 30 tablet 1   gefitinib (IRESSA) 250 MG tablet Take 1 tablet (250 mg total) by mouth daily. 30 tablet 5   HYDROmorphone (DILAUDID) 4 MG tablet Take 1 tablet (4 mg total) by mouth every 6 (six) hours as needed for severe pain. 20 tablet 0   loperamide (IMODIUM A-D) 2 MG tablet Take 1 mg by mouth daily as needed for diarrhea or loose stools.     LUTEIN PO Take 1 capsule by mouth daily. Reported on 10/27/2015     magic mouthwash SOLN Take 5 mLs by mouth 4 (four) times daily as needed for  mouth pain. 240 mL 0   magnesium 30 MG tablet Take 30 mg by mouth at bedtime.      mesalamine (APRISO) 0.375 g 24 hr capsule TAKE TWO CAPSULES BY MOUTH TWICE DAILY 120 capsule 11   Multiple Vitamins-Minerals (AIRBORNE) CHEW Chew 1 tablet by mouth daily.     Multiple Vitamins-Minerals (HAIR/SKIN/NAILS) TABS Take 1 tablet by mouth daily.     Multiple Vitamins-Minerals (PRESERVISION AREDS 2 PO) Take 1 tablet by mouth 2 (two) times daily with a meal.     NON FORMULARY Eye In jection every 14 weeks. Both eyes are injected. Last injection was 02/23/2020.     Omega-3 Fatty Acids (FISH OIL) 1200 MG CAPS Take 1,200 mg by mouth daily.      ondansetron (ZOFRAN ODT) 4 MG disintegrating tablet Take 1 tablet (4 mg total) by mouth every 8 (eight) hours as needed. 10 tablet 1   potassium chloride (KLOR-CON M) 10 MEQ tablet TAKE 1 TABLET BY MOUTH EVERY DAY 30 tablet 1   tobramycin-dexamethasone (TOBRADEX) ophthalmic solution 1 drop 4 (four) times daily.     warfarin (COUMADIN) 5 MG tablet TAKE 1 TABLET BY MOUTH DAILY EXCEPT TAKE ONE AND 1/2 TABLETS ON  WEDNESDAYS AND SATURDAYS. 40 tablet 6   No current facility-administered medications for this visit.    SURGICAL HISTORY:  Past Surgical History:  Procedure Laterality Date   Bladder tack  1996   CARDIOVERSION  11/02/09   x 2   CATARACT EXTRACTION  2010/2011   COLONOSCOPY  3 YRS AGO   COLONOSCOPY  03/06/2011   Procedure: COLONOSCOPY;  Surgeon: Rogene Houston, MD;  [facility];  Service: Endoscopy;  Laterality: N/A;  7:30 am   ESOPHAGOGASTRODUODENOSCOPY     FLEXIBLE SIGMOIDOSCOPY N/A 10/04/2012   Procedure: FLEXIBLE SIGMOIDOSCOPY;  Surgeon: Rogene Houston, MD;  [facility]: [facility];  Service: Endoscopy;  Laterality: N/A;  11:00-moved to 1015 Ann to notify pt   THYROID SURGERY  1961   TONSILLECTOMY     VIDEO BRONCHOSCOPY  05/31/2011   Procedure: VIDEO BRONCHOSCOPY;  Surgeon: Grace Isaac, MD;  Location: [facility] OR;  Service: Thoracic;  Laterality: N/A;    REVIEW OF SYSTEMS:  A comprehensive review of systems was negative except for: Ears, nose, mouth, throat, and face: positive for sore mouth   PHYSICAL EXAMINATION: General appearance: alert, cooperative, and no distress Head: Normocephalic, without obvious abnormality, atraumatic Neck: no adenopathy, no JVD, supple, symmetrical, trachea midline, and thyroid not enlarged, symmetric, no tenderness/mass/nodules Lymph nodes: Cervical, supraclavicular, and axillary nodes normal. Resp: clear to auscultation bilaterally Back: symmetric, no curvature. ROM normal. No CVA tenderness. Cardio: regular rate and rhythm, S1, S2  normal, no murmur, click, rub or gallop GI: soft, non-tender; bowel sounds normal; no masses,  no organomegaly Extremities: extremities normal, atraumatic, no cyanosis or edema  ECOG PERFORMANCE STATUS: 0 - Asymptomatic  Blood pressure (!) 151/83, pulse 85, temperature (!) 97.4 F (36.3 C), temperature source Tympanic, resp. rate 17, height _0  (1.6 m), weight 95 lb 11.2 oz (43.4 kg), SpO2 100 %.  LABORATORY DATA: Lab Results  Component Value Date   WBC 3.9 (L) 07/12/2021   HGB 13.9 07/12/2021   HCT 42.6 07/12/2021   MCV 92.6 07/12/2021   PLT 194 07/12/2021      Chemistry      Component Value Date/Time   NA 143 02/07/2021 1309   NA 141 05/14/2017 1304   K 4.3 02/07/2021 1309   K 4.3  05/14/2017 1304   CL 106 02/07/2021 1309   CO2 30 02/07/2021 1309   CO2 30 (H) 05/14/2017 1304   BUN 18 02/07/2021 1309   BUN 19.0 05/14/2017 1304   CREATININE 0.90 04/08/2021 1252   CREATININE 0.80 02/07/2021 1309   CREATININE 0.8 05/14/2017 1304      Component Value Date/Time   CALCIUM 9.3 02/07/2021 1309   CALCIUM 9.1 05/14/2017 1304   ALKPHOS 75 02/07/2021 1309   ALKPHOS 86 05/14/2017 1304   AST 26 02/07/2021 1309   AST 25 05/14/2017 1304   ALT 14 02/07/2021 1309   ALT 15 05/14/2017 1304   BILITOT 0.6 02/07/2021 1309   BILITOT 0.57 05/14/2017 1304       RADIOGRAPHIC STUDIES: No results found.   ASSESSMENT AND PLAN:  This is a very pleasant 88 years old white female with recurrent non-small cell lung cancer, adenocarcinoma with positive EGFR mutation. The patient has been on treatment with Iressa 250 mg p.o. daily for the last 74 months. The patient has been tolerating her treatment with Aricept fairly well with no concerning adverse effect except for few mouth sores. I recommended for her to continue her treatment with the same dose. I will see her back for follow-up visit in 3 months for evaluation and repeat blood work as well as CT scan of the chest. For the mouth  sores, I will give her a refill of Magic mouthwash. The patient was advised to call immediately if she has any other concerning symptoms in the interval. The patient voices understanding of current disease status and treatment options and is in agreement with the current care plan. All questions were answered. The patient knows to call the clinic with any problems, questions or concerns. We can certainly see the patient much sooner if necessary.  Disclaimer: This note was dictated with voice recognition software. Similar sounding words can inadvertently be transcribed and may not be corrected upon review.

## 2021-07-13 ENCOUNTER — Other Ambulatory Visit: Payer: Self-pay

## 2021-07-13 DIAGNOSIS — C3411 Malignant neoplasm of upper lobe, right bronchus or lung: Secondary | ICD-10-CM

## 2021-07-13 DIAGNOSIS — K121 Other forms of stomatitis: Secondary | ICD-10-CM

## 2021-07-13 MED ORDER — MAGIC MOUTHWASH: 5.0000 mL | Freq: Four times a day (QID) | ORAL | 0 refills | Status: DC | PRN

## 2021-08-10 ENCOUNTER — Ambulatory Visit (INDEPENDENT_AMBULATORY_CARE_PROVIDER_SITE_OTHER): Payer: Medicare Other | Admitting: *Deleted

## 2021-08-10 DIAGNOSIS — I4891 Unspecified atrial fibrillation: Secondary | ICD-10-CM

## 2021-08-10 DIAGNOSIS — Z5181 Encounter for therapeutic drug level monitoring: Secondary | ICD-10-CM

## 2021-08-10 LAB — POCT INR: INR: 1.5 — AB (ref 2.0–3.0)

## 2021-08-10 NOTE — Patient Instructions (Addendum)
Increase warfarin to 1 tablet daily except 1 1/2 tablets on Wednesdays and Saturdays ?Recheck in 2 wks ?

## 2021-08-15 DIAGNOSIS — Z1329 Encounter for screening for other suspected endocrine disorder: Secondary | ICD-10-CM | POA: Diagnosis not present

## 2021-08-15 DIAGNOSIS — I10 Essential (primary) hypertension: Secondary | ICD-10-CM | POA: Diagnosis not present

## 2021-08-15 DIAGNOSIS — L259 Unspecified contact dermatitis, unspecified cause: Secondary | ICD-10-CM | POA: Diagnosis not present

## 2021-08-15 DIAGNOSIS — C3411 Malignant neoplasm of upper lobe, right bronchus or lung: Secondary | ICD-10-CM | POA: Diagnosis not present

## 2021-08-15 DIAGNOSIS — E7800 Pure hypercholesterolemia, unspecified: Secondary | ICD-10-CM | POA: Diagnosis not present

## 2021-08-15 DIAGNOSIS — Z681 Body mass index (BMI) 19 or less, adult: Secondary | ICD-10-CM | POA: Diagnosis not present

## 2021-08-15 DIAGNOSIS — R946 Abnormal results of thyroid function studies: Secondary | ICD-10-CM | POA: Diagnosis not present

## 2021-08-15 DIAGNOSIS — Z13220 Encounter for screening for lipoid disorders: Secondary | ICD-10-CM | POA: Diagnosis not present

## 2021-08-17 DIAGNOSIS — C3411 Malignant neoplasm of upper lobe, right bronchus or lung: Secondary | ICD-10-CM | POA: Diagnosis not present

## 2021-08-17 DIAGNOSIS — I5032 Chronic diastolic (congestive) heart failure: Secondary | ICD-10-CM | POA: Diagnosis not present

## 2021-08-17 DIAGNOSIS — H35342 Macular cyst, hole, or pseudohole, left eye: Secondary | ICD-10-CM | POA: Diagnosis not present

## 2021-08-17 DIAGNOSIS — Z681 Body mass index (BMI) 19 or less, adult: Secondary | ICD-10-CM | POA: Diagnosis not present

## 2021-08-17 DIAGNOSIS — K5100 Ulcerative (chronic) pancolitis without complications: Secondary | ICD-10-CM | POA: Diagnosis not present

## 2021-08-17 DIAGNOSIS — I700 Atherosclerosis of aorta: Secondary | ICD-10-CM | POA: Diagnosis not present

## 2021-08-17 DIAGNOSIS — H353290 Exudative age-related macular degeneration, unspecified eye, stage unspecified: Secondary | ICD-10-CM | POA: Diagnosis not present

## 2021-08-24 ENCOUNTER — Ambulatory Visit (INDEPENDENT_AMBULATORY_CARE_PROVIDER_SITE_OTHER): Payer: Medicare Other | Admitting: *Deleted

## 2021-08-24 DIAGNOSIS — Z5181 Encounter for therapeutic drug level monitoring: Secondary | ICD-10-CM | POA: Diagnosis not present

## 2021-08-24 DIAGNOSIS — I4891 Unspecified atrial fibrillation: Secondary | ICD-10-CM

## 2021-08-24 LAB — POCT INR: INR: 2.6 (ref 2.0–3.0)

## 2021-08-24 NOTE — Patient Instructions (Signed)
Continue warfarin 1 tablet daily except 1 1/2 tablets on Wednesdays and Saturdays ?Recheck in 4 wks ?

## 2021-09-14 ENCOUNTER — Telehealth: Payer: Self-pay | Admitting: Internal Medicine

## 2021-09-14 NOTE — Telephone Encounter (Signed)
Called patient regarding upcoming appointments, patient is notified. 

## 2021-09-20 ENCOUNTER — Other Ambulatory Visit: Payer: Self-pay | Admitting: Cardiology

## 2021-09-21 ENCOUNTER — Ambulatory Visit (INDEPENDENT_AMBULATORY_CARE_PROVIDER_SITE_OTHER): Payer: Medicare Other | Admitting: *Deleted

## 2021-09-21 DIAGNOSIS — I4891 Unspecified atrial fibrillation: Secondary | ICD-10-CM

## 2021-09-21 DIAGNOSIS — Z5181 Encounter for therapeutic drug level monitoring: Secondary | ICD-10-CM

## 2021-09-21 LAB — POCT INR: INR: 2.3 (ref 2.0–3.0)

## 2021-09-21 NOTE — Patient Instructions (Signed)
Continue warfarin 1 tablet daily except 1 1/2 tablets on Wednesdays and Saturdays ?Recheck in 5 wks ?

## 2021-09-26 ENCOUNTER — Encounter: Payer: Self-pay | Admitting: Cardiology

## 2021-09-26 ENCOUNTER — Ambulatory Visit (INDEPENDENT_AMBULATORY_CARE_PROVIDER_SITE_OTHER): Payer: Medicare Other | Admitting: Cardiology

## 2021-09-26 VITALS — BP 110/64 | HR 92 | Ht 63.0 in | Wt 95.0 lb

## 2021-09-26 DIAGNOSIS — I4821 Permanent atrial fibrillation: Secondary | ICD-10-CM | POA: Diagnosis not present

## 2021-09-26 DIAGNOSIS — I4891 Unspecified atrial fibrillation: Secondary | ICD-10-CM

## 2021-09-26 NOTE — Progress Notes (Signed)
? ? ?Cardiology Office Note ? ?Date: 09/26/2021  ? ?ID: Natalie Carlson, DOB 02-Sep-1929, MRN [id number] ? ?PCP:  [name], MD  ?Cardiologist:  Rozann Lesches, MD ?Electrophysiologist:  None  ? ?Chief Complaint  ?Patient presents with  ? Cardiac follow-up  ? ? ?History of Present Illness: ?Natalie Carlson is an 88 y.o. female last seen in February 2022 by Mr. Leonides Sake NP.  She is here for a routine visit.  Continues to do very well, still lives in her own home and functional with ADLs including outside chores.  She will turn 89 on the 24th of this month. ? ?She remains on Coumadin with follow-up in the anticoagulation clinic, recent INR 2.3.  She does not describe any spontaneous bleeding problems.  Atrial fibrillation has been well controlled with mild intermittent palpitations on Cardizem CD. ? ?I personally reviewed her ECG today which shows rate controlled atrial fibrillation.  She continues to follow with Dr. Quillian Quince for routine lab work. ? ?Past Medical History:  ?Diagnosis Date  ? Arthritis   ? Atrial fibrillation ([place])   ? Cataract of both eyes   ? Chronic back pain   ? Chronic diarrhea   ? Colitis   ? Hemorrhoids   ? History of colon polyps   ? History of migraines   ? Hyperlipidemia   ? Hypothyroidism   ? Macular degeneration, dry   ? Malignant neoplasm of right upper lobe of lung ([place])   ? Stage 1A  EGFR positive  Deletion in exon 19.  ALK-negative  Right Upper Lobe Lung Mass  SUV 2.9 on PET Status post VATS and right upper lobe lobectomy 05/31/2011  ? Mucus in stool   ? Nephrolithiasis   ? 1994 - passed on its on  ? ? ?Past Surgical History:  ?Procedure Laterality Date  ? Bladder tack  1996  ? CARDIOVERSION  11/02/09  ? x 2  ? CATARACT EXTRACTION  2010/2011  ? COLONOSCOPY  3 YRS AGO  ? COLONOSCOPY  03/06/2011  ? Procedure: COLONOSCOPY;  Surgeon: Rogene Houston, MD;  [facility];  Service: Endoscopy;  Laterality: N/A;  7:30 am  ? ESOPHAGOGASTRODUODENOSCOPY    ? FLEXIBLE SIGMOIDOSCOPY N/A 10/04/2012   ? Procedure: FLEXIBLE SIGMOIDOSCOPY;  Surgeon: Rogene Houston, MD;  [facility];  Service: Endoscopy;  Laterality: N/A;  11:00-moved to 1015 Ann to notify pt  ? THYROID SURGERY  1961  ? TONSILLECTOMY    ? VIDEO BRONCHOSCOPY  05/31/2011  ? Procedure: VIDEO BRONCHOSCOPY;  Surgeon: Grace Isaac, MD;  Location: [facility];  Service: Thoracic;  Laterality: N/A;  ? ? ?Current Outpatient Medications  ?Medication Sig Dispense Refill  ? acetaminophen (TYLENOL) 500 MG tablet Take 500 mg by mouth. Patient takes as needed , which is seldom.    ? Calcium Carbonate (CALCIUM 600 PO) Take 1 tablet by mouth 2 (two) times daily.    ? diclofenac sodium (VOLTAREN) 1 % GEL Apply 1 application topically 2 (two) times daily as needed (for neck pain).     ? diltiazem (CARDIZEM CD) 180 MG 24 hr capsule TAKE ONE CAPSULE BY MOUTH EVERY DAY 90 capsule 2  ? furosemide (LASIX) 20 MG tablet TAKE 1 TABLET BY MOUTH EVERY DAY AS NEEDED FOR FLUID 30 tablet 0  ? gefitinib (IRESSA) 250 MG tablet Take 1 tablet (250 mg total) by mouth daily. 30 tablet 5  ? loperamide (IMODIUM A-D) 2 MG tablet Take 1 mg by mouth daily  as needed for diarrhea or loose stools.    ? LUTEIN PO Take 1 capsule by mouth daily. Reported on 10/27/2015    ? magic mouthwash SOLN Take 5 mLs by mouth 4 (four) times daily as needed for mouth pain. 240 mL 0  ? magnesium 30 MG tablet Take 30 mg by mouth at bedtime.     ? mesalamine (APRISO) 0.375 g 24 hr capsule TAKE TWO CAPSULES BY MOUTH TWICE DAILY 120 capsule 11  ? Multiple Vitamins-Minerals (AIRBORNE) CHEW Chew 1 tablet by mouth daily.    ? Multiple Vitamins-Minerals (HAIR/SKIN/NAILS) TABS Take 1 tablet by mouth daily.    ? Multiple Vitamins-Minerals (PRESERVISION AREDS 2 PO) Take 1 tablet by mouth 2 (two) times daily with a meal.    ? NON FORMULARY Eye In jection every 14 weeks. Both eyes are injected. ?Last injection was 02/23/2020.    ? Omega-3 Fatty Acids (FISH OIL) 1200 MG CAPS Take 1,200 mg by mouth daily.    ?  ondansetron (ZOFRAN ODT) 4 MG disintegrating tablet Take 1 tablet (4 mg total) by mouth every 8 (eight) hours as needed. 10 tablet 1  ? potassium chloride (KLOR-CON M) 10 MEQ tablet TAKE 1 TABLET BY MOUTH EVERY DAY 30 tablet 0  ? tobramycin-dexamethasone (TOBRADEX) ophthalmic solution 1 drop 4 (four) times daily.    ? warfarin (COUMADIN) 5 MG tablet TAKE 1 TABLET BY MOUTH DAILY EXCEPT TAKE ONE AND 1/2 TABLETS ON WEDNESDAYS AND SATURDAYS. 40 tablet 6  ? HYDROmorphone (DILAUDID) 4 MG tablet Take 1 tablet (4 mg total) by mouth every 6 (six) hours as needed for severe pain. (Patient not taking: Reported on 09/26/2021) 20 tablet 0  ? ?No current facility-administered medications for this visit.  ? ?Allergies:  Doxycycline, Entocort ec [budesonide], Neurontin [gabapentin], Vicodin [hydrocodone-acetaminophen], Adhesive [tape], Amoxicillin, Chocolate, Codeine, Levaquin [levofloxacin in d5w], Morphine and related, Naprosyn [naproxen], Penicillins, Prednisone, Tramadol, and Uncoded nonscreenable allergen  ? ?ROS:  No dizziness or syncope. ? ?Physical Exam: ?VS:  BP 110/64 (BP Location: Left Arm, Patient Position: Sitting, Cuff Size: Normal)   Pulse 92   Ht 5' 3"  (1.6 m)   Wt 95 lb (43.1 kg)   SpO2 98%   BMI 16.83 kg/m? , BMI Body mass index is 16.83 kg/m?. ? ?Wt Readings from Last 3 Encounters:  ?09/26/21 95 lb (43.1 kg)  ?07/12/21 95 lb 11.2 oz (43.4 kg)  ?04/12/21 97 lb 1.6 oz (44 kg)  ?  ?General: Patient appears comfortable at rest. ?HEENT: Conjunctiva and lids normal, oropharynx clear. ?Neck: Supple, no elevated JVP or carotid bruits, no thyromegaly. ?Lungs: Clear to auscultation, nonlabored breathing at rest. ?Cardiac: Irregularly irregular, no S3 or significant systolic murmur. ?Extremities: No pitting edema. ? ?ECG:  An ECG dated 06/25/2020 was personally reviewed today and demonstrated:  Atrial fibrillation. ? ?Recent Labwork: ?07/12/2021: ALT 16; AST 26; BUN 18; Creatinine 0.78; Hemoglobin 13.9; Platelet Count  194; Potassium 4.3; Sodium 139  ? ?Other Studies Reviewed Today: ? ?Chest CT 04/08/2021: ?IMPRESSION: ?1. Status post right upper lobectomy, with multiple areas of ?nodularity noted throughout the lungs bilaterally, similar to prior ?examinations, considered benign. No findings to suggest locally ?recurrent disease or metastatic disease in the thorax. ?2. The appearance of the lungs remains concerning for potential ?interstitial lung disease, and would be categorized as indeterminate ?for usual interstitial pneumonia (UIP) per current ATS guidelines. ?3. Aortic atherosclerosis, in addition to 2 vessel coronary artery ?disease. ?  ?Aortic Atherosclerosis (ICD10-I70.0). ? ?Assessment and Plan: ? ?Permanent  atrial fibrillation with CHA2DS2-VASc score of 4.  She is doing well with good heart rate control on Cardizem CD and otherwise continues on Coumadin with follow-up in anticoagulation clinic.  ECG reviewed. ? ? ?Medication Adjustments/Labs and Tests Ordered: ?Current medicines are reviewed at length with the patient today.  Concerns regarding medicines are outlined above.  ? ?Tests Ordered: ?Orders Placed This Encounter  ?Procedures  ? EKG 12-Lead  ? ? ?Medication Changes: ?No orders of the defined types were placed in this encounter. ? ? ?Disposition:  Follow up  1 year. ? ?Signed, ?Satira Sark, MD, [facility] ?09/26/2021 3:02 PM    ?[facility] [facility] ?[place], [place], [place] [address] ?Phone: [phone]; Fax: [phone]  ?

## 2021-09-26 NOTE — Patient Instructions (Signed)

## 2021-09-28 ENCOUNTER — Other Ambulatory Visit (HOSPITAL_COMMUNITY)
Admission: RE | Admit: 2021-09-28 | Discharge: 2021-09-28 | Disposition: A | Discharge status: Home / Self Care | Payer: Medicare Other | Source: Ambulatory Visit | Attending: Internal Medicine | Admitting: Internal Medicine

## 2021-09-28 DIAGNOSIS — C3490 Malignant neoplasm of unspecified part of unspecified bronchus or lung: Secondary | ICD-10-CM | POA: Diagnosis not present

## 2021-09-28 LAB — COMPREHENSIVE METABOLIC PANEL
ALT: 21 U/L (ref 0–44)
AST: 33 U/L (ref 15–41)
Albumin: 3.9 g/dL (ref 3.5–5.0)
Alkaline Phosphatase: 77 U/L (ref 38–126)
Anion gap: 8 (ref 5–15)
BUN: 21 mg/dL (ref 8–23)
CO2: 28 mmol/L (ref 22–32)
Calcium: 9 mg/dL (ref 8.9–10.3)
Chloride: 104 mmol/L (ref 98–111)
Creatinine, Ser: 0.68 mg/dL (ref 0.44–1.00)
GFR, Estimated: >60 mL/min (ref >60)
Glucose, Bld: 87 mg/dL (ref 70–99)
Potassium: 4.1 mmol/L (ref 3.5–5.1)
Sodium: 140 mmol/L (ref 135–145)
Total Bilirubin: 0.7 mg/dL (ref 0.3–1.2)
Total Protein: 7.1 g/dL (ref 6.5–8.1)

## 2021-09-28 LAB — CBC WITH DIFFERENTIAL/PLATELET
Abs Immature Granulocytes: 0 10*3/uL (ref 0.00–0.07)
Basophils Absolute: 0 10*3/uL (ref 0.0–0.1)
Basophils Relative: 1 %
Eosinophils Absolute: 0.2 10*3/uL (ref 0.0–0.5)
Eosinophils Relative: 4 %
HCT: 42.4 % (ref 36.0–46.0)
Hemoglobin: 13.7 g/dL (ref 12.0–15.0)
Immature Granulocytes: 0 %
Lymphocytes Relative: 29 %
Lymphs Abs: 1.2 10*3/uL (ref 0.7–4.0)
MCH: 30.5 pg (ref 26.0–34.0)
MCHC: 32.3 g/dL (ref 30.0–36.0)
MCV: 94.4 fL (ref 80.0–100.0)
Monocytes Absolute: 0.6 10*3/uL (ref 0.1–1.0)
Monocytes Relative: 14 %
Neutro Abs: 2.1 10*3/uL (ref 1.7–7.7)
Neutrophils Relative %: 52 %
Platelets: 202 10*3/uL (ref 150–400)
RBC: 4.49 MIL/uL (ref 3.87–5.11)
RDW: 13.6 % (ref 11.5–15.5)
WBC: 4 10*3/uL (ref 4.0–10.5)
nRBC: 0 % (ref 0.0–0.2)

## 2021-10-04 ENCOUNTER — Ambulatory Visit (HOSPITAL_COMMUNITY)
Admission: RE | Admit: 2021-10-04 | Discharge: 2021-10-04 | Disposition: A | Discharge status: Home / Self Care | Payer: Medicare Other | Source: Ambulatory Visit | Attending: Internal Medicine | Admitting: Internal Medicine

## 2021-10-04 DIAGNOSIS — R918 Other nonspecific abnormal finding of lung field: Secondary | ICD-10-CM | POA: Diagnosis not present

## 2021-10-04 DIAGNOSIS — C3490 Malignant neoplasm of unspecified part of unspecified bronchus or lung: Secondary | ICD-10-CM | POA: Insufficient documentation

## 2021-10-04 MED ORDER — IOHEXOL 300 MG/ML  SOLN
100.0000 mL | Freq: Once | INTRAMUSCULAR | Status: AC | PRN
  Administered 2021-10-04: 75 mL via INTRAVENOUS

## 2021-10-11 ENCOUNTER — Other Ambulatory Visit: Payer: Self-pay

## 2021-10-11 ENCOUNTER — Encounter: Payer: Self-pay | Admitting: *Deleted

## 2021-10-11 ENCOUNTER — Inpatient Hospital Stay: Payer: Medicare Other | Attending: Internal Medicine | Admitting: Internal Medicine

## 2021-10-11 VITALS — BP 153/80 | HR 88 | Temp 97.2°F | Resp 17 | Wt 95.1 lb

## 2021-10-11 DIAGNOSIS — L299 Pruritus, unspecified: Secondary | ICD-10-CM | POA: Diagnosis not present

## 2021-10-11 DIAGNOSIS — I4891 Unspecified atrial fibrillation: Secondary | ICD-10-CM | POA: Diagnosis not present

## 2021-10-11 DIAGNOSIS — I700 Atherosclerosis of aorta: Secondary | ICD-10-CM | POA: Diagnosis not present

## 2021-10-11 DIAGNOSIS — Z881 Allergy status to other antibiotic agents status: Secondary | ICD-10-CM | POA: Diagnosis not present

## 2021-10-11 DIAGNOSIS — J984 Other disorders of lung: Secondary | ICD-10-CM | POA: Diagnosis not present

## 2021-10-11 DIAGNOSIS — Z902 Acquired absence of lung [part of]: Secondary | ICD-10-CM | POA: Diagnosis not present

## 2021-10-11 DIAGNOSIS — I2510 Atherosclerotic heart disease of native coronary artery without angina pectoris: Secondary | ICD-10-CM | POA: Diagnosis not present

## 2021-10-11 DIAGNOSIS — Z7901 Long term (current) use of anticoagulants: Secondary | ICD-10-CM | POA: Insufficient documentation

## 2021-10-11 DIAGNOSIS — Z79899 Other long term (current) drug therapy: Secondary | ICD-10-CM | POA: Insufficient documentation

## 2021-10-11 DIAGNOSIS — Z886 Allergy status to analgesic agent status: Secondary | ICD-10-CM | POA: Insufficient documentation

## 2021-10-11 DIAGNOSIS — Z8719 Personal history of other diseases of the digestive system: Secondary | ICD-10-CM | POA: Insufficient documentation

## 2021-10-11 DIAGNOSIS — I7121 Aneurysm of the ascending aorta, without rupture: Secondary | ICD-10-CM | POA: Insufficient documentation

## 2021-10-11 DIAGNOSIS — Z880 Allergy status to penicillin: Secondary | ICD-10-CM | POA: Diagnosis not present

## 2021-10-11 DIAGNOSIS — Z885 Allergy status to narcotic agent status: Secondary | ICD-10-CM | POA: Diagnosis not present

## 2021-10-11 DIAGNOSIS — Z888 Allergy status to other drugs, medicaments and biological substances status: Secondary | ICD-10-CM | POA: Diagnosis not present

## 2021-10-11 DIAGNOSIS — C3411 Malignant neoplasm of upper lobe, right bronchus or lung: Secondary | ICD-10-CM | POA: Insufficient documentation

## 2021-10-11 NOTE — Addendum Note (Signed)
Addended by: Ardeen Garland on: 10/11/2021 03:35 PM   Modules accepted: Orders

## 2021-10-11 NOTE — Progress Notes (Signed)
[place] [phone]) [phone]   Fax:([phone]  OFFICE PROGRESS NOTE  [name], MD [place] [address]  DIAGNOSIS: Recurrent non-small cell lung cancer presented with bilateral lung nodules in October 2016 initially diagnosed as a stage IA adenocarcinoma with positive EGFR mutation with deletion in exon 71 in December 2012.  PRIOR THERAPY: Status post bronchoscopy with right VATS and right upper lobectomy with lymph node dissection under the care of Dr. Servando Snare on 05/31/2011.  CURRENT THERAPY: Iressa 250 mg by mouth daily started 04/17/2015, status post 77 months of treatment.  INTERVAL HISTORY: Natalie Carlson 88 y.o. female returns to the clinic today for follow-up visit.  The patient is feeling fine today with no concerning complaints.  She has been tolerating her treatment with Aricept fairly well.  She denied having any chest pain, shortness of breath, cough or hemoptysis.  She has no nausea, vomiting, diarrhea or constipation.  She has no rash but some itching.  She has no recent weight loss or night sweats.  She has no headache or visual changes.  She is here today for evaluation with repeat CT scan of the chest for restaging of her disease.  MEDICAL HISTORY: Past Medical History:  Diagnosis Date   Arthritis    Atrial fibrillation (HCC)    Cataract of both eyes    Chronic back pain    Chronic diarrhea    Colitis    Hemorrhoids    History of colon polyps    History of migraines    Hyperlipidemia    Hypothyroidism    Macular degeneration, dry    Malignant neoplasm of right upper lobe of lung (HCC)    Stage 1A  EGFR positive  Deletion in exon 19.  ALK-negative  Right Upper Lobe Lung Mass  SUV 2.9 on PET Status post VATS and right upper lobe lobectomy 05/31/2011   Mucus in stool    Nephrolithiasis    Jul 07, 1992 - passed on its on    ALLERGIES:  is allergic to doxycycline, entocort ec [budesonide], neurontin [gabapentin], vicodin  [hydrocodone-acetaminophen], adhesive [tape], amoxicillin, chocolate, codeine, levaquin [levofloxacin in d5w], morphine and related, naprosyn [naproxen], penicillins, prednisone, tramadol, and uncoded nonscreenable allergen.  MEDICATIONS:  Current Outpatient Medications  Medication Sig Dispense Refill   acetaminophen (TYLENOL) 500 MG tablet Take 500 mg by mouth. Patient takes as needed , which is seldom.     Calcium Carbonate (CALCIUM 600 PO) Take 1 tablet by mouth 2 (two) times daily.     diclofenac sodium (VOLTAREN) 1 % GEL Apply 1 application topically 2 (two) times daily as needed (for neck pain).      diltiazem (CARDIZEM CD) 180 MG 24 hr capsule TAKE ONE CAPSULE BY MOUTH EVERY DAY 90 capsule 2   furosemide (LASIX) 20 MG tablet TAKE 1 TABLET BY MOUTH EVERY DAY AS NEEDED FOR FLUID 30 tablet 0   gefitinib (IRESSA) 250 MG tablet Take 1 tablet (250 mg total) by mouth daily. 30 tablet 5   HYDROmorphone (DILAUDID) 4 MG tablet Take 1 tablet (4 mg total) by mouth every 6 (six) hours as needed for severe pain. (Patient not taking: Reported on 09/26/2021) 20 tablet 0   loperamide (IMODIUM A-D) 2 MG tablet Take 1 mg by mouth daily as needed for diarrhea or loose stools.     LUTEIN PO Take 1 capsule by mouth daily. Reported on 10/27/2015     magic mouthwash SOLN Take 5 mLs  by mouth 4 (four) times daily as needed for mouth pain. 240 mL 0   magnesium 30 MG tablet Take 30 mg by mouth at bedtime.      mesalamine (APRISO) 0.375 g 24 hr capsule TAKE TWO CAPSULES BY MOUTH TWICE DAILY 120 capsule 11   Multiple Vitamins-Minerals (AIRBORNE) CHEW Chew 1 tablet by mouth daily.     Multiple Vitamins-Minerals (HAIR/SKIN/NAILS) TABS Take 1 tablet by mouth daily.     Multiple Vitamins-Minerals (PRESERVISION AREDS 2 PO) Take 1 tablet by mouth 2 (two) times daily with a meal.     NON FORMULARY Eye In jection every 14 weeks. Both eyes are injected. Last injection was 02/23/2020.     Omega-3 Fatty Acids (FISH OIL) 1200 MG  CAPS Take 1,200 mg by mouth daily.     ondansetron (ZOFRAN ODT) 4 MG disintegrating tablet Take 1 tablet (4 mg total) by mouth every 8 (eight) hours as needed. 10 tablet 1   potassium chloride (KLOR-CON M) 10 MEQ tablet TAKE 1 TABLET BY MOUTH EVERY DAY 30 tablet 0   tobramycin-dexamethasone (TOBRADEX) ophthalmic solution 1 drop 4 (four) times daily.     warfarin (COUMADIN) 5 MG tablet TAKE 1 TABLET BY MOUTH DAILY EXCEPT TAKE ONE AND 1/2 TABLETS ON WEDNESDAYS AND SATURDAYS. 40 tablet 6   No current facility-administered medications for this visit.    SURGICAL HISTORY:  Past Surgical History:  Procedure Laterality Date   Bladder tack  1996   CARDIOVERSION  11/02/09   x 2   CATARACT EXTRACTION  2010/2011   COLONOSCOPY  3 YRS AGO   COLONOSCOPY  03/06/2011   Procedure: COLONOSCOPY;  Surgeon: Rogene Houston, MD;  [facility];  Service: Endoscopy;  Laterality: N/A;  7:30 am   ESOPHAGOGASTRODUODENOSCOPY     FLEXIBLE SIGMOIDOSCOPY N/A 10/04/2012   Procedure: FLEXIBLE SIGMOIDOSCOPY;  Surgeon: Rogene Houston, MD;  Location: [facility];  Service: Endoscopy;  Laterality: N/A;  11:00-moved to 1015 Ann to notify pt   THYROID SURGERY  1961   TONSILLECTOMY     VIDEO BRONCHOSCOPY  05/31/2011   Procedure: VIDEO BRONCHOSCOPY;  Surgeon: Grace Isaac, MD;  Location: [facility];  Service: Thoracic;  Laterality: N/A;    REVIEW OF SYSTEMS:  Constitutional: negative Eyes: negative Ears, nose, mouth, throat, and face: negative Respiratory: negative Cardiovascular: negative Gastrointestinal: negative Genitourinary:negative Integument/breast: positive for pruritus Hematologic/lymphatic: negative Musculoskeletal:negative Neurological: negative Behavioral/Psych: negative Endocrine: negative Allergic/Immunologic: negative   PHYSICAL EXAMINATION: General appearance: alert, cooperative, and no distress Head: Normocephalic, without obvious abnormality, atraumatic Neck: no adenopathy, no JVD,  supple, symmetrical, trachea midline, and thyroid not enlarged, symmetric, no tenderness/mass/nodules Lymph nodes: Cervical, supraclavicular, and axillary nodes normal. Resp: clear to auscultation bilaterally Back: symmetric, no curvature. ROM normal. No CVA tenderness. Cardio: regular rate and rhythm, S1, S2 normal, no murmur, click, rub or gallop GI: soft, non-tender; bowel sounds normal; no masses,  no organomegaly Extremities: extremities normal, atraumatic, no cyanosis or edema Neurologic: Alert and oriented X 3, normal strength and tone. Normal symmetric reflexes. Normal coordination and gait  ECOG PERFORMANCE STATUS: 0 - Asymptomatic  Blood pressure (!) 153/80, pulse 88, temperature (!) 97.2 F (36.2 C), temperature source Tympanic, resp. rate 17, weight 95 lb 1.6 oz (43.1 kg), SpO2 99 %.  LABORATORY DATA: Lab Results  Component Value Date   WBC 4.0 09/28/2021   HGB 13.7 09/28/2021   HCT 42.4 09/28/2021   MCV 94.4 09/28/2021   PLT 202 09/28/2021  Chemistry      Component Value Date/Time   NA 140 09/28/2021 1005   NA 141 05/14/2017 1304   K 4.1 09/28/2021 1005   K 4.3 05/14/2017 1304   CL 104 09/28/2021 1005   CO2 28 09/28/2021 1005   CO2 30 (H) 05/14/2017 1304   BUN 21 09/28/2021 1005   BUN 19.0 05/14/2017 1304   CREATININE 0.68 09/28/2021 1005   CREATININE 0.78 07/12/2021 1049   CREATININE 0.8 05/14/2017 1304      Component Value Date/Time   CALCIUM 9.0 09/28/2021 1005   CALCIUM 9.1 05/14/2017 1304   ALKPHOS 77 09/28/2021 1005   ALKPHOS 86 05/14/2017 1304   AST 33 09/28/2021 1005   AST 26 07/12/2021 1049   AST 25 05/14/2017 1304   ALT 21 09/28/2021 1005   ALT 16 07/12/2021 1049   ALT 15 05/14/2017 1304   BILITOT 0.7 09/28/2021 1005   BILITOT 0.8 07/12/2021 1049   BILITOT 0.57 05/14/2017 1304       RADIOGRAPHIC STUDIES: CT Chest W Contrast  Result Date: 10/05/2021 CLINICAL DATA:  Primary Cancer Type: Lung Imaging Indication: Routine surveillance  Interval therapy since last imaging? Yes Initial Cancer Diagnosis Date: 04/2011; Established by: Biopsy-proven Detailed Pathology: Recurrent non-small cell lung cancer initially diagnosed as a stage IA adenocarcinoma. Primary Tumor location: Right upper lobe. Recurrence? Yes; Date(s) of recurrence: 03/29/2015; Established by: Biopsy-proven Surgeries: Right upper lobectomy 05/31/2011. Chemotherapy: Yes; Ongoing? Yes;  Iressa daily Immunotherapy? No Radiation therapy? No * Tracking Code: BO * EXAM: CT CHEST WITH CONTRAST TECHNIQUE: Multidetector CT imaging of the chest was performed during intravenous contrast administration. RADIATION DOSE REDUCTION: This exam was performed according to the departmental dose-optimization program which includes automated exposure control, adjustment of the mA and/or kV according to patient size and/or use of iterative reconstruction technique. CONTRAST:  65m OMNIPAQUE IOHEXOL 300 MG/ML  SOLN COMPARISON:  Most recent CT chest 04/08/2021. 03/11/2015 PET-CT. FINDINGS: Cardiovascular: Ascending aortic aneurysm 4.0 cm in diameter on image 83 series 2. Coronary, aortic arch, and branch vessel atherosclerotic vascular disease. Mediastinum/Nodes: 2.1 cm mixed density but primarily hypodense right thyroid nodule on image 26 series 2, roughly stable, previously worked up on thyroid ultrasound 05/29/2011. This has been evaluated on previous imaging. (ref: J Am Coll Radiol. 2015 Feb;12(2): 143-50).No pathologic adenopathy observed. Lungs/Pleura: Biapical pleuroparenchymal scarring. Scattered non progressive nodularity in the lungs along with scattered subpleural reticular interstitial accentuation, no progressive nodule or specific concerning lesion. Prior right upper lobectomy. Upper Abdomen: Abdominal aortic atherosclerosis. Musculoskeletal: Levoconvex lower thoracic thoracolumbar scoliosis. IMPRESSION: 1. Stable chronic scarring, mild nodularity, and subpleural reticular interstitial  accentuation without progression. No lesions suspicious for malignancy identified in the lungs. 2. Ascending thoracic aortic aneurysm 4.0 cm in diameter. Recommend annual imaging followup by CTA or MRA. This recommendation follows 2010 ACCF/AHA/AATS/ACR/ASA/SCA/SCAI/SIR/STS/SVM Guidelines for the Diagnosis and Management of Patients with Thoracic Aortic Disease. Circulation. 2010; 121:: [id number] Aortic aneurysm NOS (ICD10-I71.9) 3.  Aortic Atherosclerosis (ICD10-I70.0).  Coronary atherosclerosis. Electronically Signed   By: WVan ClinesM.D.   On: 10/05/2021 10:23     ASSESSMENT AND PLAN:  This is a very pleasant 88years old white female with recurrent non-small cell lung cancer, adenocarcinoma with positive EGFR mutation. The patient has been on treatment with Iressa 250 mg p.o. daily for the last 77 months. The patient has been tolerating her treatment with Iressa fairly well except for occasional pruritus. She had repeat CT scan of the chest performed recently.  I personally and  independently reviewed the scan and discussed the result with the patient today. Her scan showed no concerning findings for disease progression. I recommended for her to continue on her current treatment with Iressa. I will see her back for follow-up visit in 3 months for evaluation and repeat blood work. For the history of coronary artery disease and aneurysm, she will continue her routine follow-up visit by her primary care physician. The patient was advised to call immediately if she has any other concerning symptoms in the interval. The patient voices understanding of current disease status and treatment options and is in agreement with the current care plan. All questions were answered. The patient knows to call the clinic with any problems, questions or concerns. We can certainly see the patient much sooner if necessary.  Disclaimer: This note was dictated with voice recognition software. Similar sounding  words can inadvertently be transcribed and may not be corrected upon review.

## 2021-10-11 NOTE — Progress Notes (Signed)
Oncology Nurse Navigator Documentation     10/11/2021   11:00 AM 03/15/2015   10:00 AM  Oncology Nurse Navigator Flowsheets  Navigator Location [facility]   Referral Date to RadOnc/MedOnc  03/12/2015  Navigator Encounter Type Clinic/MDC Introductory phone call  Patient Visit Type MedOnc Initial  Treatment Phase Treatment/Patient is currently on oral biologic. She has no questions at this time.  She is doing well on her tx.  Other  Interventions Psycho-Social Support Coordination of Care  Acuity Level 2-Minimal Needs (1-2 Barriers Identified)   Coordination of Care  MD Appointments  Time Spent with Patient 15 15

## 2021-10-24 ENCOUNTER — Ambulatory Visit (INDEPENDENT_AMBULATORY_CARE_PROVIDER_SITE_OTHER): Payer: Medicare Other | Admitting: *Deleted

## 2021-10-24 DIAGNOSIS — Z5181 Encounter for therapeutic drug level monitoring: Secondary | ICD-10-CM | POA: Diagnosis not present

## 2021-10-24 DIAGNOSIS — I4891 Unspecified atrial fibrillation: Secondary | ICD-10-CM

## 2021-10-24 LAB — POCT INR: INR: 3.9 — AB (ref 2.0–3.0)

## 2021-10-24 NOTE — Patient Instructions (Signed)
Hold warfarin tonight then resume 1 tablet daily except 1 1/2 tablets on Wednesdays and Saturdays Eat extra Vit K food tonight Recheck in 3 wks

## 2021-11-03 ENCOUNTER — Telehealth: Payer: Self-pay | Admitting: Cardiology

## 2021-11-03 MED ORDER — WARFARIN SODIUM 5 MG PO TABS: ORAL_TABLET | ORAL | 6 refills | Status: DC

## 2021-11-03 NOTE — Telephone Encounter (Signed)
Pt c/o medication issue:  1. Name of Medication: warfarin (COUMADIN) 5 MG tablet  2. How are you currently taking this medication (dosage and times per day)? As directed  3. Are you having a reaction (difficulty breathing--STAT)? no  4. What is your medication issue? Pharmacy called and wanted to let Dr. Domenic Polite and his team know that there has been a manufacturer change for this patient's medication.   The pharmacist said that the manufacturer change can cause absorption changes and may affect her INR levels

## 2021-11-03 NOTE — Telephone Encounter (Signed)
Thank you, ok to fill. Will keep current INR appointment for 7/3

## 2021-11-03 NOTE — Telephone Encounter (Signed)
Refill complete 

## 2021-11-08 DIAGNOSIS — H353233 Exudative age-related macular degeneration, bilateral, with inactive scar: Secondary | ICD-10-CM | POA: Diagnosis not present

## 2021-11-08 DIAGNOSIS — H35033 Hypertensive retinopathy, bilateral: Secondary | ICD-10-CM | POA: Diagnosis not present

## 2021-11-08 DIAGNOSIS — H35371 Puckering of macula, right eye: Secondary | ICD-10-CM | POA: Diagnosis not present

## 2021-11-08 DIAGNOSIS — H43813 Vitreous degeneration, bilateral: Secondary | ICD-10-CM | POA: Diagnosis not present

## 2021-11-10 ENCOUNTER — Ambulatory Visit: Payer: Medicare Other | Admitting: Cardiology

## 2021-11-11 ENCOUNTER — Telehealth: Payer: Self-pay | Admitting: Internal Medicine

## 2021-11-11 NOTE — Telephone Encounter (Signed)
Called patient regarding all August appointments, patient is notified.

## 2021-11-14 ENCOUNTER — Ambulatory Visit (INDEPENDENT_AMBULATORY_CARE_PROVIDER_SITE_OTHER): Payer: Medicare Other | Admitting: *Deleted

## 2021-11-14 DIAGNOSIS — I4891 Unspecified atrial fibrillation: Secondary | ICD-10-CM | POA: Diagnosis not present

## 2021-11-14 DIAGNOSIS — Z5181 Encounter for therapeutic drug level monitoring: Secondary | ICD-10-CM | POA: Diagnosis not present

## 2021-11-14 LAB — POCT INR: INR: 2.9 (ref 2.0–3.0)

## 2021-11-14 NOTE — Patient Instructions (Signed)
Continue warfarin 1 tablet daily except 1 1/2 tablets on Wednesdays and Saturdays Eat extra Vit K food tonight Recheck in 4 wks

## 2021-11-20 ENCOUNTER — Other Ambulatory Visit: Payer: Self-pay | Admitting: Cardiology

## 2021-11-22 ENCOUNTER — Telehealth: Payer: Self-pay

## 2021-11-22 NOTE — Telephone Encounter (Signed)
Patient called to say that her Iressa would not be available through [facility] and [facility] after December. She wanted Dr. Julien Nordmann to be aware.

## 2021-12-12 ENCOUNTER — Ambulatory Visit (INDEPENDENT_AMBULATORY_CARE_PROVIDER_SITE_OTHER): Payer: Medicare Other | Admitting: *Deleted

## 2021-12-12 DIAGNOSIS — I4891 Unspecified atrial fibrillation: Secondary | ICD-10-CM | POA: Diagnosis not present

## 2021-12-12 DIAGNOSIS — Z5181 Encounter for therapeutic drug level monitoring: Secondary | ICD-10-CM

## 2021-12-12 LAB — POCT INR: INR: 2.5 (ref 2.0–3.0)

## 2021-12-12 NOTE — Patient Instructions (Signed)
Continue warfarin 1 tablet daily except 1 1/2 tablets on Wednesdays and Saturdays Continue same diet Recheck in 4 wks

## 2021-12-24 DIAGNOSIS — Z681 Body mass index (BMI) 19 or less, adult: Secondary | ICD-10-CM | POA: Diagnosis not present

## 2021-12-24 DIAGNOSIS — N760 Acute vaginitis: Secondary | ICD-10-CM | POA: Diagnosis not present

## 2021-12-24 DIAGNOSIS — R030 Elevated blood-pressure reading, without diagnosis of hypertension: Secondary | ICD-10-CM | POA: Diagnosis not present

## 2022-01-09 ENCOUNTER — Ambulatory Visit: Payer: Medicare Other | Attending: Cardiology | Admitting: *Deleted

## 2022-01-09 DIAGNOSIS — Z5181 Encounter for therapeutic drug level monitoring: Secondary | ICD-10-CM | POA: Diagnosis not present

## 2022-01-09 DIAGNOSIS — I4891 Unspecified atrial fibrillation: Secondary | ICD-10-CM | POA: Insufficient documentation

## 2022-01-09 LAB — POCT INR: INR: 3.6 — AB (ref 2.0–3.0)

## 2022-01-09 NOTE — Patient Instructions (Signed)
Hold warfarin tonight then decrease dose to 1 tablet daily except 1 1/2 tablets on Saturdays Continue same diet Recheck in 3 wks

## 2022-01-10 ENCOUNTER — Other Ambulatory Visit (HOSPITAL_COMMUNITY): Payer: Self-pay

## 2022-01-10 ENCOUNTER — Inpatient Hospital Stay: Payer: Medicare Other

## 2022-01-10 ENCOUNTER — Other Ambulatory Visit: Payer: Self-pay

## 2022-01-10 ENCOUNTER — Inpatient Hospital Stay: Payer: Medicare Other | Attending: Internal Medicine | Admitting: Internal Medicine

## 2022-01-10 VITALS — BP 162/88 | HR 77 | Temp 97.9°F | Wt 98.5 lb

## 2022-01-10 DIAGNOSIS — Z79899 Other long term (current) drug therapy: Secondary | ICD-10-CM | POA: Insufficient documentation

## 2022-01-10 DIAGNOSIS — Z8719 Personal history of other diseases of the digestive system: Secondary | ICD-10-CM | POA: Insufficient documentation

## 2022-01-10 DIAGNOSIS — Z885 Allergy status to narcotic agent status: Secondary | ICD-10-CM | POA: Diagnosis not present

## 2022-01-10 DIAGNOSIS — Z888 Allergy status to other drugs, medicaments and biological substances status: Secondary | ICD-10-CM | POA: Diagnosis not present

## 2022-01-10 DIAGNOSIS — C3411 Malignant neoplasm of upper lobe, right bronchus or lung: Secondary | ICD-10-CM | POA: Insufficient documentation

## 2022-01-10 DIAGNOSIS — Z886 Allergy status to analgesic agent status: Secondary | ICD-10-CM | POA: Diagnosis not present

## 2022-01-10 DIAGNOSIS — Z880 Allergy status to penicillin: Secondary | ICD-10-CM | POA: Insufficient documentation

## 2022-01-10 DIAGNOSIS — C3490 Malignant neoplasm of unspecified part of unspecified bronchus or lung: Secondary | ICD-10-CM

## 2022-01-10 DIAGNOSIS — Z881 Allergy status to other antibiotic agents status: Secondary | ICD-10-CM | POA: Insufficient documentation

## 2022-01-10 LAB — CBC WITH DIFFERENTIAL (CANCER CENTER ONLY)
Abs Immature Granulocytes: 0.01 10*3/uL (ref 0.00–0.07)
Basophils Absolute: 0 10*3/uL (ref 0.0–0.1)
Basophils Relative: 1 %
Eosinophils Absolute: 0.2 10*3/uL (ref 0.0–0.5)
Eosinophils Relative: 5 %
HCT: 40.4 % (ref 36.0–46.0)
Hemoglobin: 13.6 g/dL (ref 12.0–15.0)
Immature Granulocytes: 0 %
Lymphocytes Relative: 25 %
Lymphs Abs: 1 10*3/uL (ref 0.7–4.0)
MCH: 31 pg (ref 26.0–34.0)
MCHC: 33.7 g/dL (ref 30.0–36.0)
MCV: 92 fL (ref 80.0–100.0)
Monocytes Absolute: 0.6 10*3/uL (ref 0.1–1.0)
Monocytes Relative: 15 %
Neutro Abs: 2.2 10*3/uL (ref 1.7–7.7)
Neutrophils Relative %: 54 %
Platelet Count: 227 10*3/uL (ref 150–400)
RBC: 4.39 MIL/uL (ref 3.87–5.11)
RDW: 13.4 % (ref 11.5–15.5)
WBC Count: 4.1 10*3/uL (ref 4.0–10.5)
nRBC: 0 % (ref 0.0–0.2)

## 2022-01-10 LAB — CMP (CANCER CENTER ONLY)
ALT: 17 U/L (ref 0–44)
AST: 29 U/L (ref 15–41)
Albumin: 4.2 g/dL (ref 3.5–5.0)
Alkaline Phosphatase: 88 U/L (ref 38–126)
Anion gap: 3 — ABNORMAL LOW (ref 5–15)
BUN: 19 mg/dL (ref 8–23)
CO2: 33 mmol/L — ABNORMAL HIGH (ref 22–32)
Calcium: 9.6 mg/dL (ref 8.9–10.3)
Chloride: 103 mmol/L (ref 98–111)
Creatinine: 0.71 mg/dL (ref 0.44–1.00)
GFR, Estimated: >60 mL/min (ref >60)
Glucose, Bld: 100 mg/dL — ABNORMAL HIGH (ref 70–99)
Potassium: 4.5 mmol/L (ref 3.5–5.1)
Sodium: 139 mmol/L (ref 135–145)
Total Bilirubin: 0.8 mg/dL (ref 0.3–1.2)
Total Protein: 7.2 g/dL (ref 6.5–8.1)

## 2022-01-10 MED ORDER — GEFITINIB 250 MG PO TABS: 250.0000 mg | ORAL_TABLET | Freq: Every day | ORAL | 5 refills | Status: DC

## 2022-01-10 NOTE — Progress Notes (Signed)
[place] [phone]) [phone]   Fax:(336) [phone]  OFFICE PROGRESS NOTE  [name], MD [place] [address]  DIAGNOSIS: Recurrent non-small cell lung cancer presented with bilateral lung nodules in October 2016 initially diagnosed as a stage IA adenocarcinoma with positive EGFR mutation with deletion in exon 59 in December 2012.  PRIOR THERAPY: Status post bronchoscopy with right VATS and right upper lobectomy with lymph node dissection under the care of Dr. Servando Snare on 05/31/2011.  CURRENT THERAPY: Iressa 250 mg by mouth daily started 04/17/2015, status post 80 months of treatment.  INTERVAL HISTORY: Natalie Carlson 88 y.o. female returns to the clinic today for follow-up visit.  The patient is feeling fine today with no concerning complaints.  She denied having any current chest pain, shortness of breath, cough or hemoptysis.  She has no nausea, vomiting, diarrhea or constipation.  She has no headache or visual changes.  She has no recent weight loss or night sweats.  She has been tolerating her treatment with diuresis fairly well.  She received a letter from [facility] and me indicating that the Iressa will no longer be available through the [place] and [facility] prescription savings program effective May 14, 2022.  The patient is worried about her medications in the future.  She is here today for evaluation and repeat blood work.  MEDICAL HISTORY: Past Medical History:  Diagnosis Date   Arthritis    Atrial fibrillation (HCC)    Cataract of both eyes    Chronic back pain    Chronic diarrhea    Colitis    Hemorrhoids    History of colon polyps    History of migraines    Hyperlipidemia    Hypothyroidism    Macular degeneration, dry    Malignant neoplasm of right upper lobe of lung (HCC)    Stage 1A  EGFR positive  Deletion in exon 19.  ALK-negative  Right Upper Lobe Lung Mass  SUV 2.9 on PET Status post VATS and right upper lobe lobectomy 05/31/2011    Mucus in stool    Nephrolithiasis    07/25/1992 - passed on its on    ALLERGIES:  is allergic to doxycycline, entocort ec [budesonide], neurontin [gabapentin], vicodin [hydrocodone-acetaminophen], adhesive [tape], amoxicillin, chocolate, codeine, levaquin [levofloxacin in d5w], morphine and related, naprosyn [naproxen], penicillins, prednisone, tramadol, and uncoded nonscreenable allergen.  MEDICATIONS:  Current Outpatient Medications  Medication Sig Dispense Refill   acetaminophen (TYLENOL) 500 MG tablet Take 500 mg by mouth. Patient takes as needed , which is seldom.     Biotin 10 MG TABS Take 1 tablet by mouth daily.     Calcium Carbonate (CALCIUM 600 PO) Take 1 tablet by mouth 2 (two) times daily.     diclofenac sodium (VOLTAREN) 1 % GEL Apply 1 application topically 2 (two) times daily as needed (for neck pain).      diltiazem (CARDIZEM CD) 180 MG 24 hr capsule TAKE ONE CAPSULE BY MOUTH EVERY DAY 90 capsule 2   furosemide (LASIX) 20 MG tablet TAKE 1 TABLET BY MOUTH EVERY DAY AS NEEDED FOR FLUID 30 tablet 2   gefitinib (IRESSA) 250 MG tablet Take 1 tablet (250 mg total) by mouth daily. 30 tablet 5   HYDROmorphone (DILAUDID) 4 MG tablet Take 1 tablet (4 mg total) by mouth every 6 (six) hours as needed for severe pain. (Patient not taking: Reported on 09/26/2021) 20 tablet 0   loperamide (IMODIUM A-D) 2  MG tablet Take 1 mg by mouth daily as needed for diarrhea or loose stools.     LUTEIN PO Take 1 capsule by mouth daily. Reported on 10/27/2015     magic mouthwash SOLN Take 5 mLs by mouth 4 (four) times daily as needed for mouth pain. 240 mL 0   magnesium 30 MG tablet Take 30 mg by mouth at bedtime.      mesalamine (APRISO) 0.375 g 24 hr capsule TAKE TWO CAPSULES BY MOUTH TWICE DAILY 120 capsule 11   Multiple Vitamins-Minerals (AIRBORNE) CHEW Chew 1 tablet by mouth daily.     Multiple Vitamins-Minerals (PRESERVISION AREDS 2 PO) Take 1 tablet by mouth 2 (two) times daily with a meal.     NON  FORMULARY Eye In jection every 14 weeks. Both eyes are injected. Last injection was 02/23/2020.     Omega-3 Fatty Acids (FISH OIL) 1200 MG CAPS Take 1,200 mg by mouth daily.     ondansetron (ZOFRAN ODT) 4 MG disintegrating tablet Take 1 tablet (4 mg total) by mouth every 8 (eight) hours as needed. 10 tablet 1   potassium chloride (KLOR-CON M) 10 MEQ tablet TAKE 1 TABLET BY MOUTH EVERY DAY 30 tablet 2   tobramycin-dexamethasone (TOBRADEX) ophthalmic solution 1 drop 4 (four) times daily.     warfarin (COUMADIN) 5 MG tablet Take as Directed 40 tablet 6   No current facility-administered medications for this visit.    SURGICAL HISTORY:  Past Surgical History:  Procedure Laterality Date   Bladder tack  1996   CARDIOVERSION  11/02/09   x 2   CATARACT EXTRACTION  2010/2011   COLONOSCOPY  3 YRS AGO   COLONOSCOPY  03/06/2011   Procedure: COLONOSCOPY;  Surgeon: Rogene Houston, MD;  [facility];  Service: Endoscopy;  Laterality: N/A;  7:30 am   ESOPHAGOGASTRODUODENOSCOPY     FLEXIBLE SIGMOIDOSCOPY N/A 10/04/2012   Procedure: FLEXIBLE SIGMOIDOSCOPY;  Surgeon: Rogene Houston, MD;  [facility];  Service: Endoscopy;  Laterality: N/A;  11:00-moved to 1015 Ann to notify pt   THYROID SURGERY  1961   TONSILLECTOMY     VIDEO BRONCHOSCOPY  05/31/2011   Procedure: VIDEO BRONCHOSCOPY;  Surgeon: Grace Isaac, MD;  Location: [facility] OR;  Service: Thoracic;  Laterality: N/A;    REVIEW OF SYSTEMS:  A comprehensive review of systems was negative.   PHYSICAL EXAMINATION: General appearance: alert, cooperative, and no distress Head: Normocephalic, without obvious abnormality, atraumatic Neck: no adenopathy, no JVD, supple, symmetrical, trachea midline, and thyroid not enlarged, symmetric, no tenderness/mass/nodules Lymph nodes: Cervical, supraclavicular, and axillary nodes normal. Resp: clear to auscultation bilaterally Back: symmetric, no curvature. ROM normal. No CVA  tenderness. Cardio: regular rate and rhythm, S1, S2 normal, no murmur, click, rub or gallop GI: soft, non-tender; bowel sounds normal; no masses,  no organomegaly Extremities: extremities normal, atraumatic, no cyanosis or edema  ECOG PERFORMANCE STATUS: 0 - Asymptomatic  Blood pressure (!) 162/88, pulse 77, temperature 97.9 F (36.6 C), temperature source Tympanic, weight 98 lb 8 oz (44.7 kg), SpO2 96 %.  LABORATORY DATA: Lab Results  Component Value Date   WBC 4.1 01/10/2022   HGB 13.6 01/10/2022   HCT 40.4 01/10/2022   MCV 92.0 01/10/2022   PLT 227 01/10/2022      Chemistry      Component Value Date/Time   NA 139 01/10/2022 1054   NA 141 05/14/2017 1304   K 4.5 01/10/2022 1054   K 4.3 05/14/2017  1304   CL 103 01/10/2022 1054   CO2 33 (H) 01/10/2022 1054   CO2 30 (H) 05/14/2017 1304   BUN 19 01/10/2022 1054   BUN 19.0 05/14/2017 1304   CREATININE 0.71 01/10/2022 1054   CREATININE 0.8 05/14/2017 1304      Component Value Date/Time   CALCIUM 9.6 01/10/2022 1054   CALCIUM 9.1 05/14/2017 1304   ALKPHOS 88 01/10/2022 1054   ALKPHOS 86 05/14/2017 1304   AST 29 01/10/2022 1054   AST 25 05/14/2017 1304   ALT 17 01/10/2022 1054   ALT 15 05/14/2017 1304   BILITOT 0.8 01/10/2022 1054   BILITOT 0.57 05/14/2017 1304       RADIOGRAPHIC STUDIES: No results found.   ASSESSMENT AND PLAN:  This is a very pleasant 88 years old white female with recurrent non-small cell lung cancer, adenocarcinoma with positive EGFR mutation. The patient has been on treatment with Iressa 250 mg p.o. daily for the last 80 months. The patient has been tolerating this treatment well with no concerning adverse effects. Her lab work is unremarkable today. I recommended for her to continue her current treatment with Iressa with the same dose. I will see her back for follow-up visit in 3 months for evaluation with repeat CT scan of the chest for restaging of her disease. We will try to work with  other resources for refill of her medication after May 14, 2022. The patient was advised to call immediately if she has any other concerning symptoms in the interval. The patient voices understanding of current disease status and treatment options and is in agreement with the current care plan. All questions were answered. The patient knows to call the clinic with any problems, questions or concerns. We can certainly see the patient much sooner if necessary.  Disclaimer: This note was dictated with voice recognition software. Similar sounding words can inadvertently be transcribed and may not be corrected upon review.

## 2022-01-16 ENCOUNTER — Other Ambulatory Visit: Payer: Self-pay | Admitting: Cardiology

## 2022-01-23 ENCOUNTER — Telehealth: Payer: Self-pay | Admitting: Internal Medicine

## 2022-01-23 NOTE — Telephone Encounter (Signed)
Called patient regarding upcoming November appointment, patient is notified.

## 2022-01-24 DIAGNOSIS — L03811 Cellulitis of head [any part, except face]: Secondary | ICD-10-CM | POA: Diagnosis not present

## 2022-01-24 DIAGNOSIS — Z681 Body mass index (BMI) 19 or less, adult: Secondary | ICD-10-CM | POA: Diagnosis not present

## 2022-01-27 ENCOUNTER — Telehealth: Payer: Self-pay | Admitting: Cardiology

## 2022-01-27 NOTE — Telephone Encounter (Signed)
Pt would ike a callback from [facility] in regards to an Antibiotic that she is having to start. Please advise

## 2022-01-27 NOTE — Telephone Encounter (Signed)
Returned a call to the pt and she states she took Bactrim DS and started on  01/25/2022 and only took 2 doses and states it made her very sick. She stopped Bactrim per her MD. She states she was then prescribed Cephalexin/Keflex and will take for 5 days. She stated she will also take a probiotic due to the in the past having yeast infections, advised that the keflex and probiotic will be safe to take with warfarin. She was thankful and confirmed appt for Monday with Lattie Haw.

## 2022-01-30 ENCOUNTER — Ambulatory Visit: Payer: Medicare Other | Attending: Cardiology | Admitting: *Deleted

## 2022-01-30 DIAGNOSIS — I4891 Unspecified atrial fibrillation: Secondary | ICD-10-CM | POA: Diagnosis not present

## 2022-01-30 DIAGNOSIS — Z5181 Encounter for therapeutic drug level monitoring: Secondary | ICD-10-CM | POA: Diagnosis not present

## 2022-01-30 LAB — POCT INR: INR: 3.7 — AB (ref 2.0–3.0)

## 2022-01-30 NOTE — Patient Instructions (Signed)
Hold warfarin tonight, take 1/2 tablet tomorrow night then resume 1 tablet daily except 1 1/2 tablets on Saturdays Continue same diet Recheck in 2 wks

## 2022-02-07 DIAGNOSIS — H353233 Exudative age-related macular degeneration, bilateral, with inactive scar: Secondary | ICD-10-CM | POA: Diagnosis not present

## 2022-02-07 DIAGNOSIS — H35033 Hypertensive retinopathy, bilateral: Secondary | ICD-10-CM | POA: Diagnosis not present

## 2022-02-11 DIAGNOSIS — I5032 Chronic diastolic (congestive) heart failure: Secondary | ICD-10-CM | POA: Diagnosis not present

## 2022-02-11 DIAGNOSIS — I482 Chronic atrial fibrillation, unspecified: Secondary | ICD-10-CM | POA: Diagnosis not present

## 2022-02-13 ENCOUNTER — Telehealth: Payer: Self-pay | Admitting: *Deleted

## 2022-02-13 ENCOUNTER — Ambulatory Visit: Payer: Medicare Other | Attending: Cardiology | Admitting: *Deleted

## 2022-02-13 DIAGNOSIS — I4891 Unspecified atrial fibrillation: Secondary | ICD-10-CM

## 2022-02-13 DIAGNOSIS — Z5181 Encounter for therapeutic drug level monitoring: Secondary | ICD-10-CM | POA: Diagnosis not present

## 2022-02-13 DIAGNOSIS — Z0000 Encounter for general adult medical examination without abnormal findings: Secondary | ICD-10-CM | POA: Diagnosis not present

## 2022-02-13 DIAGNOSIS — R5383 Other fatigue: Secondary | ICD-10-CM | POA: Diagnosis not present

## 2022-02-13 DIAGNOSIS — Z20828 Contact with and (suspected) exposure to other viral communicable diseases: Secondary | ICD-10-CM | POA: Diagnosis not present

## 2022-02-13 DIAGNOSIS — E039 Hypothyroidism, unspecified: Secondary | ICD-10-CM | POA: Diagnosis not present

## 2022-02-13 DIAGNOSIS — Z13220 Encounter for screening for lipoid disorders: Secondary | ICD-10-CM | POA: Diagnosis not present

## 2022-02-13 LAB — POCT INR: INR: 2.6 (ref 2.0–3.0)

## 2022-02-13 NOTE — Patient Instructions (Signed)
Continue warfarin 1 tablet daily except 1 1/2 tablets on Saturdays Continue same diet Recheck in 2 wks

## 2022-02-13 NOTE — Chronic Care Management (AMB) (Signed)
  Care Coordination   Note   02/13/2022 Name: Natalie Carlson MRN: [id number] DOB: 01/05/1930  Natalie Carlson is a 88 y.o. year old female who sees [name], MD for primary care. I reached out to Shaaron Adler by phone today to offer care coordination services.  Natalie Carlson was given information about Care Coordination services today including:   The Care Coordination services include support from the care team which includes your Nurse Coordinator, Clinical Social Worker, or Pharmacist.  The Care Coordination team is here to help remove barriers to the health concerns and goals most important to you. Care Coordination services are voluntary, and the patient may decline or stop services at any time by request to their care team member.   Care Coordination Consent Status: Patient agreed to services and verbal consent obtained.   Follow up plan:  Telephone appointment with care coordination team member scheduled for:  02/28/22  Encounter Outcome:  Pt. Scheduled  [place]  Direct Dial: [phone]

## 2022-02-16 ENCOUNTER — Other Ambulatory Visit (INDEPENDENT_AMBULATORY_CARE_PROVIDER_SITE_OTHER): Payer: Self-pay | Admitting: Internal Medicine

## 2022-02-16 NOTE — Telephone Encounter (Signed)
Patient now has an appointment 04/13/2022 with [facility]. Can he have enough to last until then?

## 2022-02-16 NOTE — Telephone Encounter (Signed)
I spoke with patient advised she needs an appointment for further refills. Transferred to Mitz[place] to schedule.

## 2022-02-20 DIAGNOSIS — C3411 Malignant neoplasm of upper lobe, right bronchus or lung: Secondary | ICD-10-CM | POA: Diagnosis not present

## 2022-02-20 DIAGNOSIS — H35342 Macular cyst, hole, or pseudohole, left eye: Secondary | ICD-10-CM | POA: Diagnosis not present

## 2022-02-20 DIAGNOSIS — I700 Atherosclerosis of aorta: Secondary | ICD-10-CM | POA: Diagnosis not present

## 2022-02-20 DIAGNOSIS — H353290 Exudative age-related macular degeneration, unspecified eye, stage unspecified: Secondary | ICD-10-CM | POA: Diagnosis not present

## 2022-02-20 DIAGNOSIS — K5100 Ulcerative (chronic) pancolitis without complications: Secondary | ICD-10-CM | POA: Diagnosis not present

## 2022-02-20 DIAGNOSIS — M810 Age-related osteoporosis without current pathological fracture: Secondary | ICD-10-CM | POA: Diagnosis not present

## 2022-02-20 DIAGNOSIS — Z0001 Encounter for general adult medical examination with abnormal findings: Secondary | ICD-10-CM | POA: Diagnosis not present

## 2022-02-20 DIAGNOSIS — R197 Diarrhea, unspecified: Secondary | ICD-10-CM | POA: Diagnosis not present

## 2022-02-20 DIAGNOSIS — Z681 Body mass index (BMI) 19 or less, adult: Secondary | ICD-10-CM | POA: Diagnosis not present

## 2022-02-20 DIAGNOSIS — I5032 Chronic diastolic (congestive) heart failure: Secondary | ICD-10-CM | POA: Diagnosis not present

## 2022-02-21 ENCOUNTER — Other Ambulatory Visit (HOSPITAL_COMMUNITY)
Admission: RE | Admit: 2022-02-21 | Discharge: 2022-02-21 | Disposition: A | Discharge status: Home / Self Care | Payer: Medicare Other | Source: Ambulatory Visit | Attending: Family Medicine | Admitting: Family Medicine

## 2022-02-21 DIAGNOSIS — R197 Diarrhea, unspecified: Secondary | ICD-10-CM | POA: Insufficient documentation

## 2022-02-21 LAB — CLOSTRIDIUM DIFFICILE BY PCR, REFLEXED: Toxigenic C. Difficile by PCR: POSITIVE — AB

## 2022-02-21 LAB — C DIFFICILE QUICK SCREEN W PCR REFLEX
C Diff antigen: POSITIVE — AB
C Diff toxin: NEGATIVE

## 2022-02-22 ENCOUNTER — Telehealth: Payer: Self-pay | Admitting: *Deleted

## 2022-02-22 NOTE — Telephone Encounter (Signed)
Received call from patient.  She has been diagnosed with C Diff and is being treated with Vancomycin 252m 4 x day x 10 days.  Told pt here is no significant interaction with warfarin.  She already has an INR appt scheduled for Monday 10/16.  She will start Vancomycin as ordered and recheck INR Monday.

## 2022-02-23 ENCOUNTER — Telehealth: Payer: Self-pay | Admitting: Pharmacy Technician

## 2022-02-23 ENCOUNTER — Other Ambulatory Visit (HOSPITAL_COMMUNITY): Payer: Self-pay

## 2022-02-23 NOTE — Telephone Encounter (Signed)
Oral Oncology Patient Advocate Encounter   Was successful in securing patient a $4,000 grant from [employer] to provide copayment coverage for Iressa (gefitinib).  This will keep the out of pocket expense at $0.     I have spoken with the patient.    The billing information is as follows and has been shared with [place].   Member ID: [id number] Group ID: [id number] RxBin: [id number] PCN: [id number] Dates of Eligibility: 02/23/2022 through 02/24/2023  Fund name:  NS[employer].   Natalie Carlson, CPhT-Adv Oncology Pharmacy Patient [place] Direct Number: [phone]  Fax: [phone]

## 2022-02-27 ENCOUNTER — Ambulatory Visit: Payer: Medicare Other | Attending: Cardiology | Admitting: *Deleted

## 2022-02-27 DIAGNOSIS — Z5181 Encounter for therapeutic drug level monitoring: Secondary | ICD-10-CM | POA: Diagnosis not present

## 2022-02-27 DIAGNOSIS — I4891 Unspecified atrial fibrillation: Secondary | ICD-10-CM

## 2022-02-27 LAB — POCT INR: INR: 5 — AB (ref 2.0–3.0)

## 2022-02-27 NOTE — Patient Instructions (Signed)
Hold warfarin on Monday and Tuesday, take 1/2 tablet on Wednesday and recheck on Thursday On Vancomycin 4 x day for C diff.  Will finish 10/21. Continue same diet Recheck 03/02/22

## 2022-02-28 ENCOUNTER — Ambulatory Visit: Payer: Self-pay | Admitting: *Deleted

## 2022-02-28 NOTE — Patient Outreach (Signed)
  Care Coordination   Initial Visit Note   02/28/2022 Name: Natalie Carlson MRN: [id number] DOB: Jan 01, 1930  Natalie Carlson is a 88 y.o. year old female who sees [name], MD for primary care. I spoke with  Shaaron Adler by phone today.  What matters to the patients health and wellness today?  Patient state she does not feel up to talking today. She has C-diff and has been having bouts of diarrhea this morning, request call back next week.     SDOH assessments and interventions completed:  No     Care Coordination Interventions Activated:  No  Care Coordination Interventions:  No, not indicated   Follow up plan: Follow up call scheduled for 10/24    Encounter Outcome:  Pt. Request to Call [place], RN, MSN, [place] Management Care Management Coordinator [phone]

## 2022-03-02 ENCOUNTER — Ambulatory Visit: Payer: Medicare Other | Attending: Cardiology | Admitting: *Deleted

## 2022-03-02 DIAGNOSIS — I4891 Unspecified atrial fibrillation: Secondary | ICD-10-CM

## 2022-03-02 DIAGNOSIS — Z5181 Encounter for therapeutic drug level monitoring: Secondary | ICD-10-CM | POA: Diagnosis not present

## 2022-03-02 LAB — POCT INR: INR: 1.5 — AB (ref 2.0–3.0)

## 2022-03-02 NOTE — Patient Instructions (Signed)
Restart warfarin 1 tablet daily  On Vancomycin 4 x day for C diff.  Will finish 10/21. Continue same diet Recheck 03/08/22

## 2022-03-07 ENCOUNTER — Ambulatory Visit: Payer: Self-pay | Admitting: *Deleted

## 2022-03-07 NOTE — Patient Outreach (Signed)
  Care Coordination   03/07/2022 Name: Natalie Carlson MRN: [id number] DOB: 07/26/1929   Care Coordination Outreach Attempts:  An unsuccessful telephone outreach was attempted for a scheduled appointment today.  Follow Up Plan:  Additional outreach attempts will be made to offer the patient care coordination information and services.   Encounter Outcome:  No Answer  Care Coordination Interventions Activated:  No   Care Coordination Interventions:  No, not indicated    Valente David, RN, MSN, [facility] Care Management Care Management Coordinator [phone]

## 2022-03-08 ENCOUNTER — Ambulatory Visit: Payer: Medicare Other | Attending: Cardiology | Admitting: *Deleted

## 2022-03-08 DIAGNOSIS — I4891 Unspecified atrial fibrillation: Secondary | ICD-10-CM | POA: Diagnosis not present

## 2022-03-08 DIAGNOSIS — Z5181 Encounter for therapeutic drug level monitoring: Secondary | ICD-10-CM

## 2022-03-08 LAB — POCT INR: INR: 2.4 (ref 2.0–3.0)

## 2022-03-08 NOTE — Patient Instructions (Signed)
Continue warfarin 1 tablet daily  Continue same diet Recheck in 3 wks

## 2022-03-09 ENCOUNTER — Other Ambulatory Visit (HOSPITAL_COMMUNITY): Payer: Self-pay

## 2022-03-17 ENCOUNTER — Telehealth: Payer: Self-pay | Admitting: *Deleted

## 2022-03-17 NOTE — Telephone Encounter (Signed)
  Care Coordination Note  03/17/2022 Name: Natalie Carlson MRN: [id number] DOB: Feb 29, 1930  Natalie Carlson is a 88 y.o. year old female who is a primary care patient of [name], MD and is actively engaged with the care management team. I reached out to Shaaron Adler by phone today to assist with re-scheduling an initial visit with the RN Case Manager  Follow up plan: Patient declines further follow up and engagement by the care management team. Appropriate care team members and provider have been notified via electronic communication.   [place]  Direct Dial: [phone]

## 2022-03-22 DIAGNOSIS — Z23 Encounter for immunization: Secondary | ICD-10-CM | POA: Diagnosis not present

## 2022-03-29 ENCOUNTER — Ambulatory Visit: Payer: Medicare Other | Attending: Cardiology | Admitting: *Deleted

## 2022-03-29 DIAGNOSIS — I4891 Unspecified atrial fibrillation: Secondary | ICD-10-CM | POA: Diagnosis not present

## 2022-03-29 DIAGNOSIS — Z5181 Encounter for therapeutic drug level monitoring: Secondary | ICD-10-CM

## 2022-03-29 DIAGNOSIS — Z23 Encounter for immunization: Secondary | ICD-10-CM | POA: Diagnosis not present

## 2022-03-29 LAB — POCT INR: INR: 2.5 (ref 2.0–3.0)

## 2022-03-29 NOTE — Patient Instructions (Signed)
Continue warfarin 1 tablet daily  Continue same diet Recheck in 4 wks

## 2022-03-30 ENCOUNTER — Telehealth: Payer: Self-pay | Admitting: Physician Assistant

## 2022-03-30 NOTE — Telephone Encounter (Signed)
Called patient regarding rescheduled 11/28 appointment to 12/04. Patient has been called and notified.

## 2022-04-03 ENCOUNTER — Other Ambulatory Visit (HOSPITAL_COMMUNITY)
Admission: RE | Admit: 2022-04-03 | Discharge: 2022-04-03 | Disposition: A | Discharge status: Home / Self Care | Payer: Medicare Other | Source: Ambulatory Visit | Attending: Internal Medicine | Admitting: Internal Medicine

## 2022-04-03 DIAGNOSIS — C3490 Malignant neoplasm of unspecified part of unspecified bronchus or lung: Secondary | ICD-10-CM | POA: Insufficient documentation

## 2022-04-03 LAB — CBC WITH DIFFERENTIAL/PLATELET
Abs Immature Granulocytes: 0 10*3/uL (ref 0.00–0.07)
Band Neutrophils: 2 %
Basophils Absolute: 0 10*3/uL (ref 0.0–0.1)
Basophils Relative: 0 %
Eosinophils Absolute: 0 10*3/uL (ref 0.0–0.5)
Eosinophils Relative: 1 %
HCT: 40 % (ref 36.0–46.0)
Hemoglobin: 13 g/dL (ref 12.0–15.0)
Lymphocytes Relative: 30 %
Lymphs Abs: 1.3 10*3/uL (ref 0.7–4.0)
MCH: 30.4 pg (ref 26.0–34.0)
MCHC: 32.5 g/dL (ref 30.0–36.0)
MCV: 93.5 fL (ref 80.0–100.0)
Monocytes Absolute: 0.3 10*3/uL (ref 0.1–1.0)
Monocytes Relative: 6 %
Neutro Abs: 2.6 10*3/uL (ref 1.7–7.7)
Neutrophils Relative %: 61 %
Platelets: 220 10*3/uL (ref 150–400)
RBC: 4.28 MIL/uL (ref 3.87–5.11)
RDW: 14.5 % (ref 11.5–15.5)
WBC: 4.2 10*3/uL (ref 4.0–10.5)
nRBC: 0 % (ref 0.0–0.2)

## 2022-04-03 LAB — COMPREHENSIVE METABOLIC PANEL
ALT: 23 U/L (ref 0–44)
AST: 36 U/L (ref 15–41)
Albumin: 3.8 g/dL (ref 3.5–5.0)
Alkaline Phosphatase: 76 U/L (ref 38–126)
Anion gap: 6 (ref 5–15)
BUN: 20 mg/dL (ref 8–23)
CO2: 28 mmol/L (ref 22–32)
Calcium: 9 mg/dL (ref 8.9–10.3)
Chloride: 104 mmol/L (ref 98–111)
Creatinine, Ser: 0.64 mg/dL (ref 0.44–1.00)
GFR, Estimated: >60 mL/min (ref >60)
Glucose, Bld: 102 mg/dL — ABNORMAL HIGH (ref 70–99)
Potassium: 3.8 mmol/L (ref 3.5–5.1)
Sodium: 138 mmol/L (ref 135–145)
Total Bilirubin: 0.7 mg/dL (ref 0.3–1.2)
Total Protein: 7.2 g/dL (ref 6.5–8.1)

## 2022-04-05 ENCOUNTER — Ambulatory Visit (HOSPITAL_COMMUNITY)
Admission: RE | Admit: 2022-04-05 | Discharge: 2022-04-05 | Disposition: A | Discharge status: Home / Self Care | Payer: Medicare Other | Source: Ambulatory Visit | Attending: Internal Medicine | Admitting: Internal Medicine

## 2022-04-05 DIAGNOSIS — R918 Other nonspecific abnormal finding of lung field: Secondary | ICD-10-CM | POA: Diagnosis not present

## 2022-04-05 DIAGNOSIS — C3490 Malignant neoplasm of unspecified part of unspecified bronchus or lung: Secondary | ICD-10-CM | POA: Insufficient documentation

## 2022-04-05 MED ORDER — IOHEXOL 300 MG/ML  SOLN
75.0000 mL | Freq: Once | INTRAMUSCULAR | Status: AC | PRN
  Administered 2022-04-05: 75 mL via INTRAVENOUS

## 2022-04-10 ENCOUNTER — Ambulatory Visit: Payer: Medicare Other | Admitting: Physician Assistant

## 2022-04-10 ENCOUNTER — Other Ambulatory Visit (HOSPITAL_COMMUNITY): Payer: Self-pay

## 2022-04-10 ENCOUNTER — Other Ambulatory Visit: Payer: Self-pay | Admitting: Pharmacist

## 2022-04-10 DIAGNOSIS — C3490 Malignant neoplasm of unspecified part of unspecified bronchus or lung: Secondary | ICD-10-CM

## 2022-04-10 MED ORDER — GEFITINIB 250 MG PO TABS
250.0000 mg | ORAL_TABLET | Freq: Every day | ORAL | 5 refills | Status: DC
  Filled 2022-04-10 – 2022-04-11 (×2): qty 30, 30d supply, fill #0
  Filled 2022-05-09: qty 30, 30d supply, fill #1
  Filled 2022-06-01: qty 30, 30d supply, fill #2
  Filled 2022-07-05 (×2): qty 30, 30d supply, fill #3
  Filled 2022-08-01: qty 30, 30d supply, fill #4
  Filled 2022-08-29: qty 30, 30d supply, fill #5

## 2022-04-10 NOTE — Progress Notes (Signed)
Oral Oncology Pharmacist Encounter  Patient now fills Iressa (gefitinib) through [facility] (patient assistance program for Iressa is being discontinued 05/14/22 as drug is now available as generic).  Prescription redirected to [facility] for dispensing.   Leron Croak, PharmD, BCPS, [facility] Hematology/Oncology Clinical Pharmacist [place] [place] [phone] 04/10/2022 9:31 AM

## 2022-04-11 ENCOUNTER — Ambulatory Visit: Payer: Medicare Other | Admitting: Internal Medicine

## 2022-04-11 ENCOUNTER — Other Ambulatory Visit: Payer: Self-pay | Admitting: Medical Oncology

## 2022-04-11 ENCOUNTER — Other Ambulatory Visit (HOSPITAL_COMMUNITY): Payer: Self-pay

## 2022-04-12 ENCOUNTER — Other Ambulatory Visit (HOSPITAL_COMMUNITY): Payer: Self-pay

## 2022-04-13 ENCOUNTER — Ambulatory Visit (INDEPENDENT_AMBULATORY_CARE_PROVIDER_SITE_OTHER): Payer: Medicare Other | Admitting: Gastroenterology

## 2022-04-13 ENCOUNTER — Ambulatory Visit: Payer: Medicare Other | Admitting: Internal Medicine

## 2022-04-13 ENCOUNTER — Encounter (INDEPENDENT_AMBULATORY_CARE_PROVIDER_SITE_OTHER): Payer: Self-pay | Admitting: Gastroenterology

## 2022-04-13 VITALS — Ht 63.0 in | Wt 92.0 lb

## 2022-04-13 DIAGNOSIS — K5010 Crohn's disease of large intestine without complications: Secondary | ICD-10-CM | POA: Diagnosis not present

## 2022-04-13 NOTE — Progress Notes (Signed)
Primary Care Physician:  [name], MD  Primary GI: previously [place]   Patient Location: Home   Provider Location: [place] office   Reason for Visit: follow up of Crohn's disease    Persons present on the virtual encounter, with roles: Carlen Fils L. Alver Sorrow, MSN, APRN, AGNP-C, Azucena Freed. Natalie Carlson, patient    Total time (minutes) spent on medical discussion: 11 minutes  Virtual Visit via telephone  visit is conducted virtually and was requested by patient.   I connected with ANNALESE Carlson on 04/13/22 at  3:15 PM EST by telephone and verified that I am speaking with the correct person using two identifiers.   I discussed the limitations, risks, security and privacy concerns of performing an evaluation and management service by telephone and the availability of in person appointments. I also discussed with the patient that there may be a patient responsible charge related to this service. The patient expressed understanding and agreed to proceed.  Chief Complaint  Patient presents with   Follow-up    Patient here today for a follow up on Crohn's. She says she will need a new prescription for mesalamine 0.375 two po bid sent to [facility]. Denies any current Gi issues. She says in Oct 2023 she was positive for Cdiff. Has since cleared the infx. Patient has stage four lung cancer.   History of Present Illness: Natalie Carlson is a 88 year old female with pmh of arthritis, afib, crohn's disease, HLD, hypothyroidism, macular degeneration, stage IV lung cancer.   diagnosed with Crohn's colitis back in October 2012 who has been maintained on mesalamine   Last seen in 2021, at that time doing well with 1-2 BMs per day. Doing well on mesalamine, taking imodium 3-4x/week.   Continued on Apriso.   Last CMP and CBC w/diff in November WNL.  Present: Patient states that she is doing well now. She had c diff back in October. States that she saw PCP and was diagnosed with a staph infection of  the scalp, she was started on a sulfa antibiotic which she could not tolerate and then started having severe diarrhea not controlled with imodium. She was then diagnosed with C diff and started on vancomycin. Having 1-2 BMs per day. Stools are solid. She notes that she started a probiotic and was eating yogurt which she has continued. Denies rectal bleeding, abdominal pain, melena. Did have some weight loss with C diff. No weight loss prior to this. She is maintained on treatment for her lung cancer. She notes she did try to stop her Apriso about a year ago but she had to restart it soon after as she was having diarrhea and abdominal cramping.  Extraintestinal Manifestations: Skin: no further issues other than staph infection as above Joints: no issues other than arthritis in her hands  Eyes: hx of AMD, sees eye doctor regularly  Flu Shot:up to date for 2023 Covid shot: recent booster  Pna: previously completed series  RSV: given in 2023  Last colonoscopy: 2012 Normal terminal ileum. Ulceration involving ileocecal valve and rectum with 2 erosions at transverse and 1 at sigmoid colon. Op she taken from ileocecal valve and rectal ulcer. Ileocecal ulcer may explain her right-sided pain and rectal ulcer explain her rectal discharge and bleeding. Flex sig in 2014 with Multiple erosions involving mucosa of sigmoid colon and rectum   Past Medical History:  Diagnosis Date   Arthritis    Atrial fibrillation ([place])    Cataract of  both eyes    Chronic back pain    Chronic diarrhea    Colitis    Hemorrhoids    History of colon polyps    History of migraines    Hyperlipidemia    Hypothyroidism    Macular degeneration, dry    Malignant neoplasm of right upper lobe of lung (HCC)    Stage 1A  EGFR positive  Deletion in exon 19.  ALK-negative  Right Upper Lobe Lung Mass  SUV 2.9 on PET Status post VATS and right upper lobe lobectomy 05/31/2011   Mucus in stool    Nephrolithiasis    1992/07/11 - passed on  its on     Past Surgical History:  Procedure Laterality Date   Bladder tack  1996   CARDIOVERSION  11/02/09   x 2   CATARACT EXTRACTION  2010/2011   COLONOSCOPY  3 YRS AGO   COLONOSCOPY  03/06/2011   Procedure: COLONOSCOPY;  Surgeon: Rogene Houston, MD;  Location: [facility];  Service: Endoscopy;  Laterality: N/A;  7:30 am   ESOPHAGOGASTRODUODENOSCOPY     FLEXIBLE SIGMOIDOSCOPY N/A 10/04/2012   Procedure: FLEXIBLE SIGMOIDOSCOPY;  Surgeon: Rogene Houston, MD;  Location: [facility];  Service: Endoscopy;  Laterality: N/A;  11:00-moved to 1015 Ann to notify pt   THYROID SURGERY  1961   TONSILLECTOMY     VIDEO BRONCHOSCOPY  05/31/2011   Procedure: VIDEO BRONCHOSCOPY;  Surgeon: Grace Isaac, MD;  Location: [facility] OR;  Service: Thoracic;  Laterality: N/A;     Current Meds  Medication Sig   acetaminophen (TYLENOL) 500 MG tablet Take 500 mg by mouth. Patient takes as needed , which is seldom.   Biotin 10 MG TABS Take 1 tablet by mouth daily.   Calcium Carbonate (CALCIUM 600 PO) Take 1 tablet by mouth 2 (two) times daily.   diclofenac sodium (VOLTAREN) 1 % GEL Apply 1 application topically 2 (two) times daily as needed (for neck pain).    diltiazem (CARDIZEM CD) 180 MG 24 hr capsule TAKE ONE CAPSULE BY MOUTH EVERY DAY   furosemide (LASIX) 20 MG tablet TAKE 1 TABLET BY MOUTH EVERY DAY AS NEEDED FOR FLUID   gefitinib (IRESSA) 250 MG tablet Take 1 tablet (250 mg total) by mouth daily.   HYDROmorphone (DILAUDID) 4 MG tablet Take 1 tablet (4 mg total) by mouth every 6 (six) hours as needed for severe pain.   loperamide (IMODIUM A-D) 2 MG tablet Take 1 mg by mouth daily as needed for diarrhea or loose stools.   LUTEIN PO Take 1 capsule by mouth daily. Reported on 10/27/2015   magic mouthwash SOLN Take 5 mLs by mouth 4 (four) times daily as needed for mouth pain.   magnesium 30 MG tablet Take 30 mg by mouth at bedtime.    mesalamine (APRISO) 0.375 g 24 hr capsule TAKE TWO CAPSULES BY MOUTH  TWICE DAILY   Multiple Vitamins-Minerals (AIRBORNE) CHEW Chew 1 tablet by mouth daily.   Multiple Vitamins-Minerals (PRESERVISION AREDS 2 PO) Take 1 tablet by mouth 2 (two) times daily with a meal.   NON FORMULARY Eye In jection every 14 weeks. Both eyes are injected. Last injection was 02/23/2020.   Omega-3 Fatty Acids (FISH OIL) 1200 MG CAPS Take 1,200 mg by mouth daily.   ondansetron (ZOFRAN ODT) 4 MG disintegrating tablet Take 1 tablet (4 mg total) by mouth every 8 (eight) hours as needed.   OVER THE COUNTER MEDICATION Soothe Eye drops uses two -  three times per day.   potassium chloride (KLOR-CON M) 10 MEQ tablet TAKE 1 TABLET BY MOUTH EVERY DAY   warfarin (COUMADIN) 5 MG tablet Take as Directed     Family History  Problem Relation Age of Onset   Stroke Mother    Stroke Father    Anesthesia problems Sister    Stroke Other        two siblings   Heart disease Other        sibling   Liver disease Paternal Grandmother    Hypotension Neg Hx    Malignant hyperthermia Neg Hx    Pseudochol deficiency Neg Hx     Social History   Socioeconomic History   Marital status: Widowed    Spouse name: Not on file   Number of children: Not on file   Years of education: Not on file   Highest education level: Not on file  Occupational History   Not on file  Tobacco Use   Smoking status: Never   Smokeless tobacco: Never  Vaping Use   Vaping Use: Never used  Substance and Sexual Activity   Alcohol use: No    Alcohol/week: 0.0 standard drinks of alcohol   Drug use: No   Sexual activity: Never  Other Topics Concern   Not on file  Social History Narrative   Widow   Retired from [employer]            Social Determinants of Radio broadcast assistant Strain: Not on file  Food Insecurity: Not on file  Transportation Needs: Not on file  Physical Activity: Not on file  Stress: Not on file  Social Connections: Not on file   Review of Systems: Gen: Denies fever, chills, anorexia.  Denies fatigue, weakness, weight loss.  CV: Denies chest pain, palpitations, syncope, peripheral edema, and claudication. Resp: Denies dyspnea at rest, cough, wheezing, coughing up blood, and pleurisy. GI: see HPI Derm: Denies rash, itching, dry skin Psych: Denies depression, anxiety, memory loss, confusion. No homicidal or suicidal ideation.  Heme: Denies bruising, bleeding, and enlarged lymph nodes.  Observations/Objective: No distress. Unable to perform physical exam due to telephone encounter. No video available.   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  Patient reports doing well on current dosage of Apriso 0.66m daily. She has 1-2 BMs per day without blood or mucus. Recent bout of C diff s/p treatment with vancomycin after antibiotic therapy for a staph infection, she had resolution of abdominal pain and diarrhea thereafter. She has no GI complaints today. Recent CMP and CBC w diff were WNL. Will update fecal calprotectin and CRP. Given patient's age, no indication for further endoscopic evaluation.    -check fecal calprotectin and CRP -continue Apriso 1.541mdaily -continue probiotic   Follow Up Instructions: 1 year   I discussed the assessment and treatment plan with the patient. The patient was provided an opportunity to ask questions and all were answered. The patient agreed with the plan and demonstrated an understanding of the instructions.   The patient was advised to call back or seek an in-person evaluation if the symptoms worsen or if the condition fails to improve as anticipated.  I provided 11 minutes of NON face-to-face time during this MyChart Video encounter.  Fawna Cranmer L. CaAlver SorrowMSN, APRN, AGNP-C Adult-Gerontology Nurse Practitioner [facility]  I have reviewed the note and agree with the APP's  assessment as described in this progress note  Patient with  longstanding history of chronic disease which has been managed with mesalamine compounds only.  Has not presented any recent complications or need for surgical intervention.  It seems that she has achieved some clinical remission with this agent.  Although not ideal for Crohn's in general, some studies suggest that mesalamine may help for very mild cases of Crohn's.  Given her age and absence of occasions, we will continue her on Apriso at the current dose.  Maylon Peppers, MD Gastroenterology and Hepatology [facility] Gastroenterology

## 2022-04-13 NOTE — Patient Instructions (Signed)
We will continue your apriso at the current dose, you can continue with your probiotic We will update labs to check inflammatory markers in the blood and stools  Follow up 1 year

## 2022-04-15 NOTE — Progress Notes (Unsigned)
[facility] OFFICE PROGRESS NOTE  Natalie Bis, MD [place] [address]  DIAGNOSIS: Recurrent non-small cell lung cancer presented with bilateral lung nodules in October 2016 initially diagnosed as a stage IA adenocarcinoma with positive EGFR mutation with deletion in exon 28 in December 2012.   PRIOR THERAPY: Status post bronchoscopy with right VATS and right upper lobectomy with lymph node dissection under the care of Dr. Servando Snare on 05/31/2011.   CURRENT THERAPY:  Iressa 250 mg by mouth daily started 04/17/2015, status post 7 years of treatment.   INTERVAL HISTORY: Natalie Carlson 88 y.o. female returns to the clinic for a follow up visit. The patient is feeling well today without any concerning complaints except she recently was treated for C. Diff. The patient continues to tolerate treatment with Iressa well without any adverse effects. However, [facility] sent her a letter that they will no longer have Iressa starting December 31st, 2023. She met with the oral chemotherapy pharmacist today. It was supposed to be delivered last week. She did not check her porch and is going to see if it is there when she gets home. She knows to call back if it is not there. Denies any fever, chills, or night sweats. She lost some weight after her c. Diff but has gained some back. She has baseline shortness of breath with inclines which is stable. Denies any chest pain, cough, or hemoptysis. Denies any nausea, vomiting, or constipation. Denies any headache or visual changes except her baseline visual changes from her macular degeneration. She receives injections for this and has an upcoming appointment this month. Denies any rashes or skin changes.She recently had a restaging CT scan. The patient is here today for evaluation prior to review her scan results.    MEDICAL HISTORY: Past Medical History:  Diagnosis Date   Arthritis    Atrial fibrillation (HCC)    Cataract of both eyes    Chronic  back pain    Chronic diarrhea    Colitis    Hemorrhoids    History of colon polyps    History of migraines    Hyperlipidemia    Hypothyroidism    Macular degeneration, dry    Malignant neoplasm of right upper lobe of lung (HCC)    Stage 1A  EGFR positive  Deletion in exon 19.  ALK-negative  Right Upper Lobe Lung Mass  SUV 2.9 on PET Status post VATS and right upper lobe lobectomy 05/31/2011   Mucus in stool    Nephrolithiasis    22-Jul-1992 - passed on its on    ALLERGIES:  is allergic to doxycycline, entocort ec [budesonide], neurontin [gabapentin], vicodin [hydrocodone-acetaminophen], adhesive [tape], amoxicillin, chocolate, codeine, levaquin [levofloxacin in d5w], morphine and related, naprosyn [naproxen], penicillins, prednisone, tramadol, and uncoded nonscreenable allergen.  MEDICATIONS:  Current Outpatient Medications  Medication Sig Dispense Refill   acetaminophen (TYLENOL) 500 MG tablet Take 500 mg by mouth. Patient takes as needed , which is seldom.     Biotin 10 MG TABS Take 1 tablet by mouth daily.     Calcium Carbonate (CALCIUM 600 PO) Take 1 tablet by mouth 2 (two) times daily.     diclofenac sodium (VOLTAREN) 1 % GEL Apply 1 application topically 2 (two) times daily as needed (for neck pain).      diltiazem (CARDIZEM CD) 180 MG 24 hr capsule TAKE ONE CAPSULE BY MOUTH EVERY DAY 90 capsule 2   furosemide (LASIX) 20 MG tablet TAKE 1  TABLET BY MOUTH EVERY DAY AS NEEDED FOR FLUID 30 tablet 2   gefitinib (IRESSA) 250 MG tablet Take 1 tablet (250 mg total) by mouth daily. 30 tablet 5   HYDROmorphone (DILAUDID) 4 MG tablet Take 1 tablet (4 mg total) by mouth every 6 (six) hours as needed for severe pain. 20 tablet 0   loperamide (IMODIUM A-D) 2 MG tablet Take 1 mg by mouth daily as needed for diarrhea or loose stools.     LUTEIN PO Take 1 capsule by mouth daily. Reported on 10/27/2015     magic mouthwash SOLN Take 5 mLs by mouth 4 (four) times daily as needed for mouth pain. 240 mL 0    magnesium 30 MG tablet Take 30 mg by mouth at bedtime.      mesalamine (APRISO) 0.375 g 24 hr capsule TAKE TWO CAPSULES BY MOUTH TWICE DAILY 120 capsule 2   Multiple Vitamins-Minerals (AIRBORNE) CHEW Chew 1 tablet by mouth daily.     Multiple Vitamins-Minerals (PRESERVISION AREDS 2 PO) Take 1 tablet by mouth 2 (two) times daily with a meal.     NON FORMULARY Eye In jection every 14 weeks. Both eyes are injected. Last injection was 02/23/2020.     Omega-3 Fatty Acids (FISH OIL) 1200 MG CAPS Take 1,200 mg by mouth daily.     ondansetron (ZOFRAN ODT) 4 MG disintegrating tablet Take 1 tablet (4 mg total) by mouth every 8 (eight) hours as needed. 10 tablet 1   OVER THE COUNTER MEDICATION Soothe Eye drops uses two - three times per day.     potassium chloride (KLOR-CON M) 10 MEQ tablet TAKE 1 TABLET BY MOUTH EVERY DAY 30 tablet 2   warfarin (COUMADIN) 5 MG tablet Take as Directed 40 tablet 6   No current facility-administered medications for this visit.    SURGICAL HISTORY:  Past Surgical History:  Procedure Laterality Date   Bladder tack  1996   CARDIOVERSION  11/02/09   x 2   CATARACT EXTRACTION  2010/2011   COLONOSCOPY  3 YRS AGO   COLONOSCOPY  03/06/2011   Procedure: COLONOSCOPY;  Surgeon: Rogene Houston, MD;  Location: [facility];  Service: Endoscopy;  Laterality: N/A;  7:30 am   ESOPHAGOGASTRODUODENOSCOPY     FLEXIBLE SIGMOIDOSCOPY N/A 10/04/2012   Procedure: FLEXIBLE SIGMOIDOSCOPY;  Surgeon: Rogene Houston, MD;  Location: [facility];  Service: Endoscopy;  Laterality: N/A;  11:00-moved to 1015 Ann to notify pt   THYROID SURGERY  1961   TONSILLECTOMY     VIDEO BRONCHOSCOPY  05/31/2011   Procedure: VIDEO BRONCHOSCOPY;  Surgeon: Grace Isaac, MD;  Location: [facility] OR;  Service: Thoracic;  Laterality: N/A;    REVIEW OF SYSTEMS:   Review of Systems  Constitutional: Negative for appetite change, chills, fatigue, fever and unexpected weight change.  HENT: Negative for mouth sores,  nosebleeds, sore throat and trouble swallowing.   Eyes: Negative for eye problems and icterus.  Respiratory: Positive for baseline dyspnea on exertion on incline. Negative for cough, hemoptysis, and wheezing.   Cardiovascular: Negative for chest pain and leg swelling.  Gastrointestinal: Positive for recovering from c.diff. Negative for abdominal pain, constipation, nausea and vomiting.  Genitourinary: Negative for bladder incontinence, difficulty urinating, dysuria, frequency and hematuria.   Musculoskeletal: Negative for back pain, gait problem, neck pain and neck stiffness.  Skin: Negative for itching and rash.  Neurological: Negative for dizziness, extremity weakness, gait problem, headaches, light-headedness and seizures.  Hematological: Negative for adenopathy.  Does not bruise/bleed easily.  Psychiatric/Behavioral: Negative for confusion, depression and sleep disturbance. The patient is not nervous/anxious.     PHYSICAL EXAMINATION:  There were no vitals taken for this visit.  ECOG PERFORMANCE STATUS: 1  Physical Exam  Constitutional: Oriented to person, place, and time and well-developed, well-nourished, and in no distress.  HENT:  Head: Normocephalic and atraumatic.  Mouth/Throat: Oropharynx is clear and moist. No oropharyngeal exudate.  Eyes: Conjunctivae are normal. Right eye exhibits no discharge. Left eye exhibits no discharge. No scleral icterus.  Neck: Normal range of motion. Neck supple.  Cardiovascular: Normal rate, irregular rhythm, normal heart sounds and intact distal pulses.   Pulmonary/Chest: Effort normal and breath sounds normal. No respiratory distress. No wheezes. No rales.  Abdominal: Soft. Bowel sounds are normal. Exhibits no distension and no mass. There is no tenderness.  Musculoskeletal: Normal range of motion. Exhibits no edema.  Lymphadenopathy:    No cervical adenopathy.  Neurological: Alert and oriented to person, place, and time. Exhibits muscle  wasting. Gait normal. Coordination normal.  Skin: Skin is warm and dry. No rash noted. Not diaphoretic. No erythema. No pallor.  Psychiatric: Mood, memory and judgment normal.  Vitals reviewed.  LABORATORY DATA: Lab Results  Component Value Date   WBC 4.2 04/03/2022   HGB 13.0 04/03/2022   HCT 40.0 04/03/2022   MCV 93.5 04/03/2022   PLT 220 04/03/2022      Chemistry      Component Value Date/Time   NA 138 04/03/2022 1244   NA 141 05/14/2017 1304   K 3.8 04/03/2022 1244   K 4.3 05/14/2017 1304   CL 104 04/03/2022 1244   CO2 28 04/03/2022 1244   CO2 30 (H) 05/14/2017 1304   BUN 20 04/03/2022 1244   BUN 19.0 05/14/2017 1304   CREATININE 0.64 04/03/2022 1244   CREATININE 0.71 01/10/2022 1054   CREATININE 0.8 05/14/2017 1304      Component Value Date/Time   CALCIUM 9.0 04/03/2022 1244   CALCIUM 9.1 05/14/2017 1304   ALKPHOS 76 04/03/2022 1244   ALKPHOS 86 05/14/2017 1304   AST 36 04/03/2022 1244   AST 29 01/10/2022 1054   AST 25 05/14/2017 1304   ALT 23 04/03/2022 1244   ALT 17 01/10/2022 1054   ALT 15 05/14/2017 1304   BILITOT 0.7 04/03/2022 1244   BILITOT 0.8 01/10/2022 1054   BILITOT 0.57 05/14/2017 1304       RADIOGRAPHIC STUDIES:  CT Chest W Contrast  Result Date: 04/06/2022 CLINICAL DATA:  Non-small cell lung cancer restaging * Tracking Code: BO * EXAM: CT CHEST WITH CONTRAST TECHNIQUE: Multidetector CT imaging of the chest was performed during intravenous contrast administration. RADIATION DOSE REDUCTION: This exam was performed according to the departmental dose-optimization program which includes automated exposure control, adjustment of the mA and/or kV according to patient size and/or use of iterative reconstruction technique. CONTRAST:  45m OMNIPAQUE IOHEXOL 300 MG/ML  SOLN COMPARISON:  CT chest, 10/04/2021 FINDINGS: Cardiovascular: Aortic atherosclerosis. Tubular ascending thoracic aorta measures 3.9 x 3.8 cm. Cardiomegaly with severe biatrial  dilatation. No pericardial effusion. Mediastinum/Nodes: No enlarged mediastinal, hilar, or axillary lymph nodes. Bilateral thyroid nodules, previously imaged by dedicated thyroid ultrasound. This has been evaluated on previous imaging. (ref: J Am Coll Radiol. 2015 Feb;12(2): 143-50). Trachea, and esophagus demonstrate no significant findings. Lungs/Pleura: Unchanged postoperative appearance of the chest status post right upper lobectomy. Unchanged mild irregular peripheral interstitial opacity and septal thickening at the bilateral lung bases. Occasional small stable,  definitively benign nodules, for example a 0.5 cm fissural nodule lateral segment right middle lobe (series 4, image 63) and a 0.3 cm nodule of the anterior left upper lobe (series 4, image 47). No pleural effusion or pneumothorax. Upper Abdomen: No acute abnormality. Musculoskeletal: No chest wall abnormality. No acute osseous findings. IMPRESSION: 1. Unchanged postoperative appearance of the chest status post right upper lobectomy. No evidence of recurrent or metastatic disease in the chest. 2. Unchanged mild irregular peripheral interstitial opacity and septal thickening at the bilateral lung bases, suggestive of mild pulmonary fibrosis. This could be further characterized by pulmonary referral and dedicated interstitial lung disease protocol CT if desired. 3. Occasional small stable, definitively benign nodules, for which no specific further follow-up or characterization is required. 4. Cardiomegaly with severe biatrial dilatation. Aortic Atherosclerosis (ICD10-I70.0). Electronically Signed   By: Delanna Ahmadi M.D.   On: 04/06/2022 21:28     ASSESSMENT/PLAN:  This is a very pleasant 88 years old Caucasian female with recurrent non-small cell lung cancer, adenocarcinoma with positive EGFR mutation. The patient has been on treatment with Iressa 250 mg p.o. daily for the last 7 years.  The patient recently had a retaging CT scan. Dr. Julien Nordmann  personally and independently reivewed the scan and discussed the results with the patient. The scan showed no evidence of disease progression.   She will continue on the same treatment at the same dose. We will see her back for a follow up visit in 3 months with repeat blood work  She will check when she gets home if her medication is on the porch as it was supposed to be delivered last week. Our pharmacist is also going to call [facility] to ensure it was mailed out.   The patient was advised to call immediately if she has any concerning symptoms in the interval. The patient voices understanding of current disease status and treatment options and is in agreement with the current care plan. All questions were answered. The patient knows to call the clinic with any problems, questions or concerns. We can certainly see the patient much sooner if necessary         No orders of the defined types were placed in this encounter.     Deshay Kirstein L Eilene Voigt, PA-C 04/15/22  ADDENDUM: Hematology/Oncology Attending: I had a face-to-face encounter with the patient today.  I reviewed her record, lab, scan and recommended her care plan.  This is a very pleasant 88 years old white female with recurrent non-small cell lung cancer, adenocarcinoma with positive EGFR mutation.  The patient treatment was targeted therapy with Iressa 250 mg p.o. daily status post 7 years of treatment and has been tolerating this treatment well with no concerning adverse effects.  She was treated recently for C. difficile and improving. She had repeat CT scan of the chest performed recently.  I personally and independently reviewed the scan and discussed the result with the patient today. Her scan showed no concerning findings for disease patient. I recommended for the patient to continue her current treatment with Iressa with the same dose. I will see her back for follow-up visit in 3 months for evaluation and repeat blood  work. She was advised to call immediately if she has any other concerning symptoms in the interval. The total time spent in the appointment was 30 minutes. Disclaimer: This note was dictated with voice recognition software. Similar sounding words can inadvertently be transcribed and may be missed upon review. Eilleen Kempf, MD

## 2022-04-17 ENCOUNTER — Other Ambulatory Visit (HOSPITAL_COMMUNITY): Payer: Self-pay

## 2022-04-17 ENCOUNTER — Inpatient Hospital Stay: Payer: Medicare Other | Attending: Physician Assistant | Admitting: Physician Assistant

## 2022-04-17 ENCOUNTER — Other Ambulatory Visit: Payer: Self-pay | Admitting: Cardiology

## 2022-04-17 ENCOUNTER — Other Ambulatory Visit: Payer: Self-pay

## 2022-04-17 VITALS — BP 152/97 | HR 90 | Temp 97.9°F | Resp 15 | Wt 96.2 lb

## 2022-04-17 DIAGNOSIS — I4891 Unspecified atrial fibrillation: Secondary | ICD-10-CM | POA: Diagnosis not present

## 2022-04-17 DIAGNOSIS — E042 Nontoxic multinodular goiter: Secondary | ICD-10-CM | POA: Diagnosis not present

## 2022-04-17 DIAGNOSIS — R634 Abnormal weight loss: Secondary | ICD-10-CM | POA: Insufficient documentation

## 2022-04-17 DIAGNOSIS — Z880 Allergy status to penicillin: Secondary | ICD-10-CM | POA: Insufficient documentation

## 2022-04-17 DIAGNOSIS — R0602 Shortness of breath: Secondary | ICD-10-CM | POA: Diagnosis not present

## 2022-04-17 DIAGNOSIS — Z888 Allergy status to other drugs, medicaments and biological substances status: Secondary | ICD-10-CM | POA: Diagnosis not present

## 2022-04-17 DIAGNOSIS — Z886 Allergy status to analgesic agent status: Secondary | ICD-10-CM | POA: Insufficient documentation

## 2022-04-17 DIAGNOSIS — Z881 Allergy status to other antibiotic agents status: Secondary | ICD-10-CM | POA: Insufficient documentation

## 2022-04-17 DIAGNOSIS — Z902 Acquired absence of lung [part of]: Secondary | ICD-10-CM | POA: Insufficient documentation

## 2022-04-17 DIAGNOSIS — Z79899 Other long term (current) drug therapy: Secondary | ICD-10-CM | POA: Insufficient documentation

## 2022-04-17 DIAGNOSIS — I700 Atherosclerosis of aorta: Secondary | ICD-10-CM | POA: Diagnosis not present

## 2022-04-17 DIAGNOSIS — Z8719 Personal history of other diseases of the digestive system: Secondary | ICD-10-CM | POA: Insufficient documentation

## 2022-04-17 DIAGNOSIS — C3411 Malignant neoplasm of upper lobe, right bronchus or lung: Secondary | ICD-10-CM | POA: Diagnosis not present

## 2022-04-17 DIAGNOSIS — I517 Cardiomegaly: Secondary | ICD-10-CM | POA: Diagnosis not present

## 2022-04-17 DIAGNOSIS — R590 Localized enlarged lymph nodes: Secondary | ICD-10-CM | POA: Insufficient documentation

## 2022-04-17 DIAGNOSIS — G8929 Other chronic pain: Secondary | ICD-10-CM | POA: Diagnosis not present

## 2022-04-17 DIAGNOSIS — Z885 Allergy status to narcotic agent status: Secondary | ICD-10-CM | POA: Diagnosis not present

## 2022-04-17 NOTE — Telephone Encounter (Signed)
Refill request for warfarin:  Last INR was 2.5 on 03/29/22 Next INR due 04/26/22 LOV was 09/26/21  Myles Gip MD  Refill approved.

## 2022-04-19 ENCOUNTER — Telehealth: Payer: Self-pay | Admitting: Cardiology

## 2022-04-19 NOTE — Telephone Encounter (Signed)
Patient notified and verbalized understanding. Pt will seek evaluation w/ PCP.

## 2022-04-19 NOTE — Telephone Encounter (Signed)
Spoke to pt who stated she has had 2 episodes- Sunday/Tuesday- of dizziness. Pt denies any other symptoms. Pt stated she felt like she was falling but did not actually fall. Pt stated that she is currently taking all prescribed medications as directed.  Pt unable to check bp d/t not having a cuff.   Please advise.

## 2022-04-19 NOTE — Telephone Encounter (Signed)
Pt c/o BP issue: STAT if pt c/o blurred vision, one-sided weakness or slurred speech  1. What are your last 5 BP readings? 152/97  2. Are you having any other symptoms (ex. Dizziness, headache, blurred vision, passed out)?  Dizziness  3. What is your BP issue?  Pt has been feeling dizzy when getting up from sitting down and at church recently.

## 2022-04-26 ENCOUNTER — Ambulatory Visit: Payer: Medicare Other | Attending: Cardiology | Admitting: *Deleted

## 2022-04-26 DIAGNOSIS — Z5181 Encounter for therapeutic drug level monitoring: Secondary | ICD-10-CM | POA: Diagnosis not present

## 2022-04-26 DIAGNOSIS — I4891 Unspecified atrial fibrillation: Secondary | ICD-10-CM

## 2022-04-26 LAB — POCT INR: INR: 4 — AB (ref 2.0–3.0)

## 2022-04-26 NOTE — Patient Instructions (Signed)
Hold warfarin today then resume 1 tablet daily  Increase broccoli  Recheck in 3 wks

## 2022-05-02 DIAGNOSIS — H353211 Exudative age-related macular degeneration, right eye, with active choroidal neovascularization: Secondary | ICD-10-CM | POA: Diagnosis not present

## 2022-05-02 DIAGNOSIS — H43813 Vitreous degeneration, bilateral: Secondary | ICD-10-CM | POA: Diagnosis not present

## 2022-05-02 DIAGNOSIS — H3540 Unspecified peripheral retinal degeneration: Secondary | ICD-10-CM | POA: Diagnosis not present

## 2022-05-02 DIAGNOSIS — H35033 Hypertensive retinopathy, bilateral: Secondary | ICD-10-CM | POA: Diagnosis not present

## 2022-05-02 DIAGNOSIS — H35371 Puckering of macula, right eye: Secondary | ICD-10-CM | POA: Diagnosis not present

## 2022-05-02 DIAGNOSIS — H353223 Exudative age-related macular degeneration, left eye, with inactive scar: Secondary | ICD-10-CM | POA: Diagnosis not present

## 2022-05-09 ENCOUNTER — Other Ambulatory Visit (HOSPITAL_COMMUNITY): Payer: Self-pay

## 2022-05-10 ENCOUNTER — Other Ambulatory Visit (HOSPITAL_COMMUNITY): Payer: Self-pay

## 2022-05-10 ENCOUNTER — Other Ambulatory Visit: Payer: Self-pay

## 2022-05-17 ENCOUNTER — Other Ambulatory Visit (INDEPENDENT_AMBULATORY_CARE_PROVIDER_SITE_OTHER): Payer: Self-pay | Admitting: Gastroenterology

## 2022-05-17 ENCOUNTER — Ambulatory Visit: Payer: Medicare Other | Attending: Cardiology | Admitting: *Deleted

## 2022-05-17 DIAGNOSIS — Z5181 Encounter for therapeutic drug level monitoring: Secondary | ICD-10-CM | POA: Diagnosis not present

## 2022-05-17 DIAGNOSIS — I4891 Unspecified atrial fibrillation: Secondary | ICD-10-CM

## 2022-05-17 LAB — POCT INR: INR: 2.6 (ref 2.0–3.0)

## 2022-05-17 NOTE — Patient Instructions (Signed)
Continue warfarin 1 tablet daily  Continue broccoli  Recheck in 3 wks

## 2022-05-19 DIAGNOSIS — I4821 Permanent atrial fibrillation: Secondary | ICD-10-CM | POA: Diagnosis not present

## 2022-05-19 DIAGNOSIS — Z681 Body mass index (BMI) 19 or less, adult: Secondary | ICD-10-CM | POA: Diagnosis not present

## 2022-05-23 ENCOUNTER — Other Ambulatory Visit (HOSPITAL_COMMUNITY): Payer: Self-pay

## 2022-05-23 ENCOUNTER — Telehealth: Payer: Self-pay | Admitting: Pharmacy Technician

## 2022-05-23 NOTE — Telephone Encounter (Signed)
Oral Oncology Patient Advocate Encounter   Was successful in securing patient a $5,000 grant from [employer] ([employer]) to provide copayment coverage for Iressa.  This will keep the out of pocket expense at $0.     I have spoken with the patient.    The billing information is as follows and has been shared with [employer]   RxBin: [id number] PCN:  [id number] Member ID: [id number] Group ID: [id number] Dates of Eligibility: 11/24/21 through 05/23/23  Lady Deutscher, CPhT-Adv Oncology Pharmacy Patient [place] Direct Number: [phone]  Fax: [phone]

## 2022-06-01 ENCOUNTER — Other Ambulatory Visit (HOSPITAL_COMMUNITY): Payer: Self-pay

## 2022-06-01 DIAGNOSIS — H3540 Unspecified peripheral retinal degeneration: Secondary | ICD-10-CM | POA: Diagnosis not present

## 2022-06-01 DIAGNOSIS — H353211 Exudative age-related macular degeneration, right eye, with active choroidal neovascularization: Secondary | ICD-10-CM | POA: Diagnosis not present

## 2022-06-01 DIAGNOSIS — H353223 Exudative age-related macular degeneration, left eye, with inactive scar: Secondary | ICD-10-CM | POA: Diagnosis not present

## 2022-06-01 DIAGNOSIS — H04123 Dry eye syndrome of bilateral lacrimal glands: Secondary | ICD-10-CM | POA: Diagnosis not present

## 2022-06-01 DIAGNOSIS — H35033 Hypertensive retinopathy, bilateral: Secondary | ICD-10-CM | POA: Diagnosis not present

## 2022-06-01 DIAGNOSIS — H43813 Vitreous degeneration, bilateral: Secondary | ICD-10-CM | POA: Diagnosis not present

## 2022-06-01 DIAGNOSIS — H35371 Puckering of macula, right eye: Secondary | ICD-10-CM | POA: Diagnosis not present

## 2022-06-02 ENCOUNTER — Other Ambulatory Visit (HOSPITAL_COMMUNITY): Payer: Self-pay

## 2022-06-02 ENCOUNTER — Other Ambulatory Visit: Payer: Self-pay

## 2022-06-05 ENCOUNTER — Other Ambulatory Visit: Payer: Self-pay

## 2022-06-05 ENCOUNTER — Other Ambulatory Visit (HOSPITAL_COMMUNITY): Payer: Self-pay

## 2022-06-07 ENCOUNTER — Ambulatory Visit: Payer: Medicare Other | Attending: Cardiology | Admitting: *Deleted

## 2022-06-07 ENCOUNTER — Other Ambulatory Visit: Payer: Self-pay

## 2022-06-07 ENCOUNTER — Other Ambulatory Visit (HOSPITAL_COMMUNITY): Payer: Self-pay

## 2022-06-07 DIAGNOSIS — I4891 Unspecified atrial fibrillation: Secondary | ICD-10-CM | POA: Diagnosis not present

## 2022-06-07 DIAGNOSIS — Z5181 Encounter for therapeutic drug level monitoring: Secondary | ICD-10-CM | POA: Diagnosis not present

## 2022-06-07 LAB — POCT INR: INR: 2.7 (ref 2.0–3.0)

## 2022-06-07 NOTE — Patient Instructions (Signed)
Continue warfarin 1 tablet daily  Continue broccoli  Recheck in 4 wks

## 2022-06-21 ENCOUNTER — Other Ambulatory Visit (HOSPITAL_COMMUNITY): Payer: Self-pay

## 2022-06-27 ENCOUNTER — Other Ambulatory Visit (HOSPITAL_COMMUNITY): Payer: Self-pay

## 2022-06-27 DIAGNOSIS — H35033 Hypertensive retinopathy, bilateral: Secondary | ICD-10-CM | POA: Diagnosis not present

## 2022-06-27 DIAGNOSIS — H04123 Dry eye syndrome of bilateral lacrimal glands: Secondary | ICD-10-CM | POA: Diagnosis not present

## 2022-06-27 DIAGNOSIS — H353211 Exudative age-related macular degeneration, right eye, with active choroidal neovascularization: Secondary | ICD-10-CM | POA: Diagnosis not present

## 2022-06-27 DIAGNOSIS — H353223 Exudative age-related macular degeneration, left eye, with inactive scar: Secondary | ICD-10-CM | POA: Diagnosis not present

## 2022-06-27 DIAGNOSIS — H35371 Puckering of macula, right eye: Secondary | ICD-10-CM | POA: Diagnosis not present

## 2022-06-27 DIAGNOSIS — H3540 Unspecified peripheral retinal degeneration: Secondary | ICD-10-CM | POA: Diagnosis not present

## 2022-06-27 DIAGNOSIS — H43813 Vitreous degeneration, bilateral: Secondary | ICD-10-CM | POA: Diagnosis not present

## 2022-07-05 ENCOUNTER — Other Ambulatory Visit: Payer: Self-pay

## 2022-07-05 ENCOUNTER — Ambulatory Visit: Payer: Medicare Other | Attending: Cardiology | Admitting: *Deleted

## 2022-07-05 ENCOUNTER — Other Ambulatory Visit (HOSPITAL_COMMUNITY): Payer: Self-pay

## 2022-07-05 DIAGNOSIS — Z5181 Encounter for therapeutic drug level monitoring: Secondary | ICD-10-CM

## 2022-07-05 DIAGNOSIS — I4891 Unspecified atrial fibrillation: Secondary | ICD-10-CM | POA: Diagnosis not present

## 2022-07-05 LAB — POCT INR: INR: 2.7 (ref 2.0–3.0)

## 2022-07-05 NOTE — Patient Instructions (Signed)
Continue warfarin 1 tablet daily  Continue broccoli  Recheck in 6 wks

## 2022-07-11 ENCOUNTER — Other Ambulatory Visit (HOSPITAL_COMMUNITY): Payer: Self-pay

## 2022-07-17 ENCOUNTER — Other Ambulatory Visit (HOSPITAL_COMMUNITY): Payer: Self-pay

## 2022-07-17 ENCOUNTER — Other Ambulatory Visit: Payer: Self-pay

## 2022-07-17 ENCOUNTER — Inpatient Hospital Stay: Payer: Medicare Other | Admitting: Internal Medicine

## 2022-07-17 ENCOUNTER — Inpatient Hospital Stay: Payer: Medicare Other | Attending: Physician Assistant

## 2022-07-17 VITALS — BP 146/90 | HR 77 | Temp 97.7°F | Resp 16 | Wt 96.0 lb

## 2022-07-17 DIAGNOSIS — Z7901 Long term (current) use of anticoagulants: Secondary | ICD-10-CM | POA: Diagnosis not present

## 2022-07-17 DIAGNOSIS — Z8719 Personal history of other diseases of the digestive system: Secondary | ICD-10-CM | POA: Diagnosis not present

## 2022-07-17 DIAGNOSIS — C3411 Malignant neoplasm of upper lobe, right bronchus or lung: Secondary | ICD-10-CM | POA: Insufficient documentation

## 2022-07-17 DIAGNOSIS — Z79899 Other long term (current) drug therapy: Secondary | ICD-10-CM | POA: Diagnosis not present

## 2022-07-17 DIAGNOSIS — Z881 Allergy status to other antibiotic agents status: Secondary | ICD-10-CM | POA: Insufficient documentation

## 2022-07-17 DIAGNOSIS — Z888 Allergy status to other drugs, medicaments and biological substances status: Secondary | ICD-10-CM | POA: Diagnosis not present

## 2022-07-17 DIAGNOSIS — C3490 Malignant neoplasm of unspecified part of unspecified bronchus or lung: Secondary | ICD-10-CM | POA: Diagnosis not present

## 2022-07-17 DIAGNOSIS — Z880 Allergy status to penicillin: Secondary | ICD-10-CM | POA: Insufficient documentation

## 2022-07-17 DIAGNOSIS — Z886 Allergy status to analgesic agent status: Secondary | ICD-10-CM | POA: Diagnosis not present

## 2022-07-17 DIAGNOSIS — I4891 Unspecified atrial fibrillation: Secondary | ICD-10-CM | POA: Insufficient documentation

## 2022-07-17 DIAGNOSIS — Z885 Allergy status to narcotic agent status: Secondary | ICD-10-CM | POA: Insufficient documentation

## 2022-07-17 LAB — CBC WITH DIFFERENTIAL (CANCER CENTER ONLY)
Abs Immature Granulocytes: 0.01 10*3/uL (ref 0.00–0.07)
Basophils Absolute: 0 10*3/uL (ref 0.0–0.1)
Basophils Relative: 1 %
Eosinophils Absolute: 0.1 10*3/uL (ref 0.0–0.5)
Eosinophils Relative: 2 %
HCT: 42.3 % (ref 36.0–46.0)
Hemoglobin: 14.1 g/dL (ref 12.0–15.0)
Immature Granulocytes: 0 %
Lymphocytes Relative: 21 %
Lymphs Abs: 0.9 10*3/uL (ref 0.7–4.0)
MCH: 30.9 pg (ref 26.0–34.0)
MCHC: 33.3 g/dL (ref 30.0–36.0)
MCV: 92.8 fL (ref 80.0–100.0)
Monocytes Absolute: 0.5 10*3/uL (ref 0.1–1.0)
Monocytes Relative: 13 %
Neutro Abs: 2.5 10*3/uL (ref 1.7–7.7)
Neutrophils Relative %: 63 %
Platelet Count: 222 10*3/uL (ref 150–400)
RBC: 4.56 MIL/uL (ref 3.87–5.11)
RDW: 13.3 % (ref 11.5–15.5)
WBC Count: 4 10*3/uL (ref 4.0–10.5)
nRBC: 0 % (ref 0.0–0.2)

## 2022-07-17 LAB — CMP (CANCER CENTER ONLY)
ALT: 16 U/L (ref 0–44)
AST: 27 U/L (ref 15–41)
Albumin: 4.3 g/dL (ref 3.5–5.0)
Alkaline Phosphatase: 75 U/L (ref 38–126)
Anion gap: 5 (ref 5–15)
BUN: 26 mg/dL — ABNORMAL HIGH (ref 8–23)
CO2: 32 mmol/L (ref 22–32)
Calcium: 9.5 mg/dL (ref 8.9–10.3)
Chloride: 102 mmol/L (ref 98–111)
Creatinine: 0.81 mg/dL (ref 0.44–1.00)
GFR, Estimated: >60 mL/min (ref >60)
Glucose, Bld: 94 mg/dL (ref 70–99)
Potassium: 4.2 mmol/L (ref 3.5–5.1)
Sodium: 139 mmol/L (ref 135–145)
Total Bilirubin: 0.6 mg/dL (ref 0.3–1.2)
Total Protein: 7.4 g/dL (ref 6.5–8.1)

## 2022-07-17 NOTE — Progress Notes (Signed)
[place] [phone]   Fax:(336) [phone]  OFFICE PROGRESS NOTE  [name], MD [place] [address]  DIAGNOSIS: Recurrent non-small cell lung cancer presented with bilateral lung nodules in October 2016 initially diagnosed as a stage IA adenocarcinoma with positive EGFR mutation with deletion in exon 62 in December 2012.  PRIOR THERAPY: Status post bronchoscopy with right VATS and right upper lobectomy with lymph node dissection under the care of Dr. Servando Snare on 05/31/2011.  CURRENT THERAPY: Iressa 250 mg by mouth daily started 04/17/2015, status post 87 months of treatment.  INTERVAL HISTORY: Natalie Carlson 89 y.o. female returns to clinic today for follow-up visit.  The patient is feeling fine today with no concerning complaints except for the itching in the scalp and some dryness of her skin and nail changes in the right index.  She denied having any current chest pain, shortness of breath, cough or hemoptysis.  She has no nausea, vomiting, diarrhea or constipation.  She has no headache or visual changes.  She denied having any recent weight loss or night sweats.  She continues to tolerate her treatment with Iressa fairly well.  She is here today for evaluation and repeat blood work.  MEDICAL HISTORY: Past Medical History:  Diagnosis Date   Arthritis    Atrial fibrillation (HCC)    Cataract of both eyes    Chronic back pain    Chronic diarrhea    Colitis    Hemorrhoids    History of colon polyps    History of migraines    Hyperlipidemia    Hypothyroidism    Macular degeneration, dry    Malignant neoplasm of right upper lobe of lung (HCC)    Stage 1A  EGFR positive  Deletion in exon 19.  ALK-negative  Right Upper Lobe Lung Mass  SUV 2.9 on PET Status post VATS and right upper lobe lobectomy 05/31/2011   Mucus in stool    Nephrolithiasis    08-18-92 - passed on its on    ALLERGIES:  is allergic to doxycycline, entocort ec  [budesonide], neurontin [gabapentin], vicodin [hydrocodone-acetaminophen], bactrim [sulfamethoxazole-trimethoprim], cephalexin, adhesive [tape], amoxicillin, chocolate, codeine, levaquin [levofloxacin in d5w], morphine and related, naprosyn [naproxen], penicillins, prednisone, tramadol, and uncoded nonscreenable allergen.  MEDICATIONS:  Current Outpatient Medications  Medication Sig Dispense Refill   acetaminophen (TYLENOL) 500 MG tablet Take 500 mg by mouth. Patient takes as needed , which is seldom.     Biotin 10 MG TABS Take 1 tablet by mouth daily.     Calcium Carbonate (CALCIUM 600 PO) Take 1 tablet by mouth 2 (two) times daily.     diclofenac sodium (VOLTAREN) 1 % GEL Apply 1 application topically 2 (two) times daily as needed (for neck pain).      diltiazem (CARDIZEM CD) 180 MG 24 hr capsule TAKE ONE CAPSULE BY MOUTH EVERY DAY 90 capsule 2   furosemide (LASIX) 20 MG tablet TAKE 1 TABLET BY MOUTH EVERY DAY AS NEEDED FOR FLUID 30 tablet 2   gefitinib (IRESSA) 250 MG tablet Take 1 tablet (250 mg total) by mouth daily. 30 tablet 5   HYDROmorphone (DILAUDID) 4 MG tablet Take 1 tablet (4 mg total) by mouth every 6 (six) hours as needed for severe pain. 20 tablet 0   loperamide (IMODIUM A-D) 2 MG tablet Take 1 mg by mouth daily as needed for diarrhea or loose stools.     LUTEIN PO Take 1 capsule  by mouth daily. Reported on 10/27/2015     magic mouthwash SOLN Take 5 mLs by mouth 4 (four) times daily as needed for mouth pain. 240 mL 0   magnesium 30 MG tablet Take 30 mg by mouth at bedtime.      mesalamine (APRISO) 0.375 g 24 hr capsule TAKE TWO CAPSULES BY MOUTH TWICE DAILY 120 capsule 2   Multiple Vitamins-Minerals (AIRBORNE) CHEW Chew 1 tablet by mouth daily.     Multiple Vitamins-Minerals (PRESERVISION AREDS 2 PO) Take 1 tablet by mouth 2 (two) times daily with a meal.     NON FORMULARY Eye In jection every 14 weeks. Both eyes are injected. Last injection was 02/23/2020.     Omega-3 Fatty  Acids (FISH OIL) 1200 MG CAPS Take 1,200 mg by mouth daily.     ondansetron (ZOFRAN ODT) 4 MG disintegrating tablet Take 1 tablet (4 mg total) by mouth every 8 (eight) hours as needed. 10 tablet 1   OVER THE COUNTER MEDICATION Soothe Eye drops uses two - three times per day.     potassium chloride (KLOR-CON M) 10 MEQ tablet TAKE 1 TABLET BY MOUTH EVERY DAY 30 tablet 2   warfarin (COUMADIN) 5 MG tablet TAKE 1 TABLET BY MOUTH DAILY OR AS DIRECTED BY COUMADIN CLINIC 40 tablet 6   No current facility-administered medications for this visit.    SURGICAL HISTORY:  Past Surgical History:  Procedure Laterality Date   Bladder tack  1996   CARDIOVERSION  11/02/09   x 2   CATARACT EXTRACTION  2010/2011   COLONOSCOPY  3 YRS AGO   COLONOSCOPY  03/06/2011   Procedure: COLONOSCOPY;  Surgeon: Rogene Houston, MD;  [facility]: [facility];  Service: Endoscopy;  Laterality: N/A;  7:30 am   ESOPHAGOGASTRODUODENOSCOPY     FLEXIBLE SIGMOIDOSCOPY N/A 10/04/2012   Procedure: FLEXIBLE SIGMOIDOSCOPY;  Surgeon: Rogene Houston, MD;  Location: [facility];  Service: Endoscopy;  Laterality: N/A;  11:00-moved to 1015 Ann to notify pt   THYROID SURGERY  1961   TONSILLECTOMY     VIDEO BRONCHOSCOPY  05/31/2011   Procedure: VIDEO BRONCHOSCOPY;  Surgeon: Grace Isaac, MD;  Location: [facility] OR;  Service: Thoracic;  Laterality: N/A;    REVIEW OF SYSTEMS:  A comprehensive review of systems was negative except for: Integument/breast: positive for dryness   PHYSICAL EXAMINATION: General appearance: alert, cooperative, and no distress Head: Normocephalic, without obvious abnormality, atraumatic Neck: no adenopathy, no JVD, supple, symmetrical, trachea midline, and thyroid not enlarged, symmetric, no tenderness/mass/nodules Lymph nodes: Cervical, supraclavicular, and axillary nodes normal. Resp: clear to auscultation bilaterally Back: symmetric, no curvature. ROM normal. No CVA tenderness. Cardio: regular rate and  rhythm, S1, S2 normal, no murmur, click, rub or gallop GI: soft, non-tender; bowel sounds normal; no masses,  no organomegaly Extremities: extremities normal, atraumatic, no cyanosis or edema  ECOG PERFORMANCE STATUS: 0 - Asymptomatic  Blood pressure (!) 146/90, pulse 77, temperature 97.7 F (36.5 C), temperature source Oral, resp. rate 16, weight 96 lb (43.5 kg), SpO2 98 %.  LABORATORY DATA: Lab Results  Component Value Date   WBC 4.0 07/17/2022   HGB 14.1 07/17/2022   HCT 42.3 07/17/2022   MCV 92.8 07/17/2022   PLT 222 07/17/2022      Chemistry      Component Value Date/Time   NA 138 04/03/2022 1244   NA 141 05/14/2017 1304   K 3.8 04/03/2022 1244   K 4.3 05/14/2017 1304   CL  104 04/03/2022 1244   CO2 28 04/03/2022 1244   CO2 30 (H) 05/14/2017 1304   BUN 20 04/03/2022 1244   BUN 19.0 05/14/2017 1304   CREATININE 0.64 04/03/2022 1244   CREATININE 0.71 01/10/2022 1054   CREATININE 0.8 05/14/2017 1304      Component Value Date/Time   CALCIUM 9.0 04/03/2022 1244   CALCIUM 9.1 05/14/2017 1304   ALKPHOS 76 04/03/2022 1244   ALKPHOS 86 05/14/2017 1304   AST 36 04/03/2022 1244   AST 29 01/10/2022 1054   AST 25 05/14/2017 1304   ALT 23 04/03/2022 1244   ALT 17 01/10/2022 1054   ALT 15 05/14/2017 1304   BILITOT 0.7 04/03/2022 1244   BILITOT 0.8 01/10/2022 1054   BILITOT 0.57 05/14/2017 1304       RADIOGRAPHIC STUDIES: No results found.   ASSESSMENT AND PLAN:  This is a very pleasant 89 years old white female with recurrent non-small cell lung cancer, adenocarcinoma with positive EGFR mutation. The patient has been on treatment with Iressa 250 mg p.o. daily for the last 87 months. The patient has been tolerating this treatment fairly well with no concerning adverse effect except for the dryness of the skin and mild paronychia.  Blood work today is unremarkable. I recommended for her to continue her current treatment with diuresis of the same dose. I will see her  back for follow-up visit in 3 months for evaluation with repeat CT scan of the chest. The patient was advised to call immediately if she has any concerning symptoms in the interval. The patient voices understanding of current disease status and treatment options and is in agreement with the current care plan. All questions were answered. The patient knows to call the clinic with any problems, questions or concerns. We can certainly see the patient much sooner if necessary.  Disclaimer: This note was dictated with voice recognition software. Similar sounding words can inadvertently be transcribed and may not be corrected upon review.

## 2022-07-31 ENCOUNTER — Telehealth: Payer: Self-pay | Admitting: Medical Oncology

## 2022-07-31 ENCOUNTER — Other Ambulatory Visit: Payer: Self-pay | Admitting: Medical Oncology

## 2022-07-31 NOTE — Telephone Encounter (Addendum)
"   Sore with clear drainage-  [name] noticed a  draining sore above her left eye at the hair line .The drainage was clear.  Her hair was matted .   Itching over total body. Benadryl help with the itching.   Iressa- She completed 4 days of it  and did not take it yesterday due to symptoms.  She did not endorse any other sores or rashes.

## 2022-08-01 ENCOUNTER — Other Ambulatory Visit (HOSPITAL_COMMUNITY): Payer: Self-pay

## 2022-08-01 ENCOUNTER — Other Ambulatory Visit: Payer: Self-pay

## 2022-08-01 NOTE — Telephone Encounter (Signed)
Per Dr. Julien Nordmann ,I told Natalie Carlson that Dr. Julien Nordmann "advised for good cleaning of the area and apply antibiotic ointments to the sore area of the skin.  Okay to hold Iressa for few days and then resume again. "      Pt stated she took Benadryl last night and she slept 5 hours.

## 2022-08-07 ENCOUNTER — Other Ambulatory Visit (HOSPITAL_COMMUNITY): Payer: Self-pay

## 2022-08-08 ENCOUNTER — Other Ambulatory Visit: Payer: Self-pay

## 2022-08-10 ENCOUNTER — Other Ambulatory Visit (HOSPITAL_COMMUNITY): Payer: Self-pay

## 2022-08-16 ENCOUNTER — Ambulatory Visit: Payer: Medicare Other | Attending: Cardiology | Admitting: *Deleted

## 2022-08-16 ENCOUNTER — Other Ambulatory Visit (INDEPENDENT_AMBULATORY_CARE_PROVIDER_SITE_OTHER): Payer: Self-pay | Admitting: Gastroenterology

## 2022-08-16 ENCOUNTER — Other Ambulatory Visit: Payer: Self-pay | Admitting: Cardiology

## 2022-08-16 DIAGNOSIS — Z5181 Encounter for therapeutic drug level monitoring: Secondary | ICD-10-CM | POA: Diagnosis not present

## 2022-08-16 DIAGNOSIS — I4891 Unspecified atrial fibrillation: Secondary | ICD-10-CM | POA: Diagnosis not present

## 2022-08-16 LAB — POCT INR: INR: 2.4 (ref 2.0–3.0)

## 2022-08-16 NOTE — Patient Instructions (Signed)
Continue warfarin 1 tablet daily  Continue broccoli  Recheck in 6 wks 

## 2022-08-28 ENCOUNTER — Other Ambulatory Visit: Payer: Self-pay

## 2022-08-28 DIAGNOSIS — C3490 Malignant neoplasm of unspecified part of unspecified bronchus or lung: Secondary | ICD-10-CM

## 2022-08-29 ENCOUNTER — Other Ambulatory Visit (HOSPITAL_COMMUNITY): Payer: Self-pay

## 2022-09-05 DIAGNOSIS — H35033 Hypertensive retinopathy, bilateral: Secondary | ICD-10-CM | POA: Diagnosis not present

## 2022-09-05 DIAGNOSIS — H353211 Exudative age-related macular degeneration, right eye, with active choroidal neovascularization: Secondary | ICD-10-CM | POA: Diagnosis not present

## 2022-09-05 DIAGNOSIS — H04123 Dry eye syndrome of bilateral lacrimal glands: Secondary | ICD-10-CM | POA: Diagnosis not present

## 2022-09-05 DIAGNOSIS — H3540 Unspecified peripheral retinal degeneration: Secondary | ICD-10-CM | POA: Diagnosis not present

## 2022-09-05 DIAGNOSIS — H35371 Puckering of macula, right eye: Secondary | ICD-10-CM | POA: Diagnosis not present

## 2022-09-05 DIAGNOSIS — H353223 Exudative age-related macular degeneration, left eye, with inactive scar: Secondary | ICD-10-CM | POA: Diagnosis not present

## 2022-09-05 DIAGNOSIS — H43813 Vitreous degeneration, bilateral: Secondary | ICD-10-CM | POA: Diagnosis not present

## 2022-09-06 ENCOUNTER — Other Ambulatory Visit (HOSPITAL_COMMUNITY): Payer: Self-pay

## 2022-09-28 ENCOUNTER — Ambulatory Visit: Payer: Medicare Other | Attending: Cardiology | Admitting: *Deleted

## 2022-09-28 DIAGNOSIS — I4891 Unspecified atrial fibrillation: Secondary | ICD-10-CM | POA: Diagnosis not present

## 2022-09-28 DIAGNOSIS — Z5181 Encounter for therapeutic drug level monitoring: Secondary | ICD-10-CM | POA: Diagnosis not present

## 2022-09-28 LAB — POCT INR: POC INR: 2.2

## 2022-09-28 NOTE — Patient Instructions (Signed)
Description   Continue warfarin 1 tablet daily  Continue broccoli  Recheck in 6 wks

## 2022-10-03 ENCOUNTER — Other Ambulatory Visit (HOSPITAL_COMMUNITY): Payer: Self-pay

## 2022-10-03 ENCOUNTER — Other Ambulatory Visit: Payer: Self-pay | Admitting: Internal Medicine

## 2022-10-03 DIAGNOSIS — C3490 Malignant neoplasm of unspecified part of unspecified bronchus or lung: Secondary | ICD-10-CM

## 2022-10-03 MED ORDER — GEFITINIB 250 MG PO TABS
250.0000 mg | ORAL_TABLET | Freq: Every day | ORAL | 5 refills | Status: DC
  Filled 2022-10-03: qty 30, 30d supply, fill #0
  Filled 2022-10-27: qty 30, 30d supply, fill #1
  Filled 2022-11-23: qty 30, 30d supply, fill #2
  Filled 2022-12-29 (×2): qty 30, 30d supply, fill #3
  Filled 2023-01-18: qty 30, 30d supply, fill #4

## 2022-10-04 ENCOUNTER — Other Ambulatory Visit: Payer: Self-pay

## 2022-10-04 ENCOUNTER — Other Ambulatory Visit (HOSPITAL_COMMUNITY): Payer: Self-pay

## 2022-10-05 ENCOUNTER — Other Ambulatory Visit (HOSPITAL_COMMUNITY): Payer: Self-pay

## 2022-10-05 ENCOUNTER — Other Ambulatory Visit: Payer: Self-pay

## 2022-10-15 ENCOUNTER — Other Ambulatory Visit: Payer: Self-pay | Admitting: Cardiology

## 2022-10-17 ENCOUNTER — Inpatient Hospital Stay: Payer: Medicare Other | Attending: Physician Assistant

## 2022-10-17 DIAGNOSIS — Z8719 Personal history of other diseases of the digestive system: Secondary | ICD-10-CM | POA: Diagnosis not present

## 2022-10-17 DIAGNOSIS — Z79899 Other long term (current) drug therapy: Secondary | ICD-10-CM | POA: Insufficient documentation

## 2022-10-17 DIAGNOSIS — Z888 Allergy status to other drugs, medicaments and biological substances status: Secondary | ICD-10-CM | POA: Insufficient documentation

## 2022-10-17 DIAGNOSIS — I517 Cardiomegaly: Secondary | ICD-10-CM | POA: Insufficient documentation

## 2022-10-17 DIAGNOSIS — C3490 Malignant neoplasm of unspecified part of unspecified bronchus or lung: Secondary | ICD-10-CM

## 2022-10-17 DIAGNOSIS — Z7901 Long term (current) use of anticoagulants: Secondary | ICD-10-CM | POA: Insufficient documentation

## 2022-10-17 DIAGNOSIS — I700 Atherosclerosis of aorta: Secondary | ICD-10-CM | POA: Diagnosis not present

## 2022-10-17 DIAGNOSIS — Z885 Allergy status to narcotic agent status: Secondary | ICD-10-CM | POA: Diagnosis not present

## 2022-10-17 DIAGNOSIS — Z902 Acquired absence of lung [part of]: Secondary | ICD-10-CM | POA: Diagnosis not present

## 2022-10-17 DIAGNOSIS — Z881 Allergy status to other antibiotic agents status: Secondary | ICD-10-CM | POA: Insufficient documentation

## 2022-10-17 DIAGNOSIS — Z886 Allergy status to analgesic agent status: Secondary | ICD-10-CM | POA: Diagnosis not present

## 2022-10-17 DIAGNOSIS — I2510 Atherosclerotic heart disease of native coronary artery without angina pectoris: Secondary | ICD-10-CM | POA: Diagnosis not present

## 2022-10-17 DIAGNOSIS — C3411 Malignant neoplasm of upper lobe, right bronchus or lung: Secondary | ICD-10-CM | POA: Diagnosis not present

## 2022-10-17 DIAGNOSIS — I4891 Unspecified atrial fibrillation: Secondary | ICD-10-CM | POA: Diagnosis not present

## 2022-10-17 DIAGNOSIS — Z880 Allergy status to penicillin: Secondary | ICD-10-CM | POA: Insufficient documentation

## 2022-10-17 LAB — CBC WITH DIFFERENTIAL/PLATELET
Abs Immature Granulocytes: 0.01 10*3/uL (ref 0.00–0.07)
Basophils Absolute: 0 10*3/uL (ref 0.0–0.1)
Basophils Relative: 1 %
Eosinophils Absolute: 0.1 10*3/uL (ref 0.0–0.5)
Eosinophils Relative: 1 %
HCT: 41.9 % (ref 36.0–46.0)
Hemoglobin: 13.8 g/dL (ref 12.0–15.0)
Immature Granulocytes: 0 %
Lymphocytes Relative: 19 %
Lymphs Abs: 1.1 10*3/uL (ref 0.7–4.0)
MCH: 31 pg (ref 26.0–34.0)
MCHC: 32.9 g/dL (ref 30.0–36.0)
MCV: 94.2 fL (ref 80.0–100.0)
Monocytes Absolute: 0.7 10*3/uL (ref 0.1–1.0)
Monocytes Relative: 13 %
Neutro Abs: 3.7 10*3/uL (ref 1.7–7.7)
Neutrophils Relative %: 66 %
Platelets: 218 10*3/uL (ref 150–400)
RBC: 4.45 MIL/uL (ref 3.87–5.11)
RDW: 13.4 % (ref 11.5–15.5)
WBC: 5.6 10*3/uL (ref 4.0–10.5)
nRBC: 0 % (ref 0.0–0.2)

## 2022-10-17 LAB — COMPREHENSIVE METABOLIC PANEL
ALT: 21 U/L (ref 0–44)
AST: 33 U/L (ref 15–41)
Albumin: 4.1 g/dL (ref 3.5–5.0)
Alkaline Phosphatase: 76 U/L (ref 38–126)
Anion gap: 8 (ref 5–15)
BUN: 26 mg/dL — ABNORMAL HIGH (ref 8–23)
CO2: 29 mmol/L (ref 22–32)
Calcium: 9.4 mg/dL (ref 8.9–10.3)
Chloride: 100 mmol/L (ref 98–111)
Creatinine, Ser: 0.94 mg/dL (ref 0.44–1.00)
GFR, Estimated: 57 mL/min — ABNORMAL LOW (ref >60)
Glucose, Bld: 115 mg/dL — ABNORMAL HIGH (ref 70–99)
Potassium: 4.2 mmol/L (ref 3.5–5.1)
Sodium: 137 mmol/L (ref 135–145)
Total Bilirubin: 0.9 mg/dL (ref 0.3–1.2)
Total Protein: 7.4 g/dL (ref 6.5–8.1)

## 2022-10-18 ENCOUNTER — Ambulatory Visit (HOSPITAL_COMMUNITY)
Admission: RE | Admit: 2022-10-18 | Discharge: 2022-10-18 | Disposition: A | Discharge status: Home / Self Care | Payer: Medicare Other | Source: Ambulatory Visit | Attending: Internal Medicine | Admitting: Internal Medicine

## 2022-10-18 DIAGNOSIS — C3490 Malignant neoplasm of unspecified part of unspecified bronchus or lung: Secondary | ICD-10-CM | POA: Diagnosis not present

## 2022-10-18 DIAGNOSIS — R918 Other nonspecific abnormal finding of lung field: Secondary | ICD-10-CM | POA: Diagnosis not present

## 2022-10-18 MED ORDER — IOHEXOL 300 MG/ML  SOLN
65.0000 mL | Freq: Once | INTRAMUSCULAR | Status: AC | PRN
  Administered 2022-10-18: 65 mL via INTRAVENOUS

## 2022-10-23 ENCOUNTER — Other Ambulatory Visit: Payer: Self-pay

## 2022-10-23 ENCOUNTER — Inpatient Hospital Stay: Payer: Medicare Other | Admitting: Internal Medicine

## 2022-10-23 VITALS — BP 152/83 | HR 69 | Temp 97.6°F | Resp 14 | Ht 63.0 in | Wt 97.1 lb

## 2022-10-23 DIAGNOSIS — Z886 Allergy status to analgesic agent status: Secondary | ICD-10-CM | POA: Diagnosis not present

## 2022-10-23 DIAGNOSIS — I517 Cardiomegaly: Secondary | ICD-10-CM | POA: Diagnosis not present

## 2022-10-23 DIAGNOSIS — I2510 Atherosclerotic heart disease of native coronary artery without angina pectoris: Secondary | ICD-10-CM | POA: Diagnosis not present

## 2022-10-23 DIAGNOSIS — C3411 Malignant neoplasm of upper lobe, right bronchus or lung: Secondary | ICD-10-CM

## 2022-10-23 DIAGNOSIS — Z7901 Long term (current) use of anticoagulants: Secondary | ICD-10-CM | POA: Diagnosis not present

## 2022-10-23 DIAGNOSIS — Z885 Allergy status to narcotic agent status: Secondary | ICD-10-CM | POA: Diagnosis not present

## 2022-10-23 DIAGNOSIS — I4891 Unspecified atrial fibrillation: Secondary | ICD-10-CM | POA: Diagnosis not present

## 2022-10-23 DIAGNOSIS — I700 Atherosclerosis of aorta: Secondary | ICD-10-CM | POA: Diagnosis not present

## 2022-10-23 DIAGNOSIS — Z880 Allergy status to penicillin: Secondary | ICD-10-CM | POA: Diagnosis not present

## 2022-10-23 DIAGNOSIS — Z8719 Personal history of other diseases of the digestive system: Secondary | ICD-10-CM | POA: Diagnosis not present

## 2022-10-23 DIAGNOSIS — Z79899 Other long term (current) drug therapy: Secondary | ICD-10-CM | POA: Diagnosis not present

## 2022-10-23 DIAGNOSIS — Z888 Allergy status to other drugs, medicaments and biological substances status: Secondary | ICD-10-CM | POA: Diagnosis not present

## 2022-10-23 DIAGNOSIS — Z881 Allergy status to other antibiotic agents status: Secondary | ICD-10-CM | POA: Diagnosis not present

## 2022-10-23 DIAGNOSIS — Z902 Acquired absence of lung [part of]: Secondary | ICD-10-CM | POA: Diagnosis not present

## 2022-10-23 NOTE — Progress Notes (Signed)
[facility] Telephone:(336) [phone]   Fax:(336) [phone]  OFFICE PROGRESS NOTE  Natalie Chimera, MD [address] [place] [address]  DIAGNOSIS: Recurrent non-small cell lung cancer presented with bilateral lung nodules in October 2016 initially diagnosed as a stage IA adenocarcinoma with positive EGFR mutation with deletion in exon 43 in December 2012.  PRIOR THERAPY: Status post bronchoscopy with right VATS and right upper lobectomy with lymph node dissection under the care of Dr. Tyrone Carlson on 05/31/2011.  CURRENT THERAPY: Iressa 250 mg by mouth daily started 04/17/2015, status post 90 months of treatment.  INTERVAL HISTORY: Natalie Carlson 89 y.o. female returns to the clinic today for follow-up visit.  The patient is feeling fine today with no concerning complaints.  She denied having any current chest pain, shortness of breath, cough or hemoptysis.  She has no nausea, vomiting, diarrhea or constipation.  She has no headache or visual changes.  She denied having any recent weight loss or night sweats.  The patient continues to tolerate her treatment with Iressa fairly well.  She is here today for evaluation with repeat CT scan of the chest for restaging of her disease.  MEDICAL HISTORY: Past Medical History:  Diagnosis Date   Arthritis    Atrial fibrillation (HCC)    Cataract of both eyes    Chronic back pain    Chronic diarrhea    Colitis    Hemorrhoids    History of colon polyps    History of migraines    Hyperlipidemia    Hypothyroidism    Macular degeneration, dry    Malignant neoplasm of right upper lobe of lung (HCC)    Stage 1A  EGFR positive  Deletion in exon 19.  ALK-negative  Right Upper Lobe Lung Mass  SUV 2.9 on PET Status post VATS and right upper lobe lobectomy 05/31/2011   Mucus in stool    Nephrolithiasis    1994 - passed on its on    ALLERGIES:  is allergic to doxycycline, entocort ec [budesonide], neurontin [gabapentin], vicodin  [hydrocodone-acetaminophen], bactrim [sulfamethoxazole-trimethoprim], cephalexin, adhesive [tape], amoxicillin, chocolate, codeine, levaquin [levofloxacin in d5w], morphine and codeine, naprosyn [naproxen], penicillins, prednisone, tramadol, and uncoded nonscreenable allergen.  MEDICATIONS:  Current Outpatient Medications  Medication Sig Dispense Refill   acetaminophen (TYLENOL) 500 MG tablet Take 500 mg by mouth. Patient takes as needed , which is seldom.     Biotin 10 MG TABS Take 1 tablet by mouth daily.     Calcium Carbonate (CALCIUM 600 PO) Take 1 tablet by mouth 2 (two) times daily.     diclofenac sodium (VOLTAREN) 1 % GEL Apply 1 application topically 2 (two) times daily as needed (for neck pain).  (Patient not taking: Reported on 07/17/2022)     diltiazem (CARDIZEM CD) 180 MG 24 hr capsule TAKE ONE CAPSULE BY MOUTH EVERY DAY 90 capsule 0   furosemide (LASIX) 20 MG tablet TAKE 1 TABLET BY MOUTH EVERY DAY AS NEEDED FOR FLUID 30 tablet 1   gefitinib (IRESSA) 250 MG tablet Take 1 tablet (250 mg total) by mouth daily. 30 tablet 5   HYDROmorphone (DILAUDID) 4 MG tablet Take 1 tablet (4 mg total) by mouth every 6 (six) hours as needed for severe pain. (Patient not taking: Reported on 07/17/2022) 20 tablet 0   loperamide (IMODIUM A-D) 2 MG tablet Take 1 mg by mouth daily as needed for diarrhea or loose stools. (Patient not taking: Reported on 07/17/2022)  LUTEIN PO Take 1 capsule by mouth daily. Reported on 10/27/2015     magic mouthwash SOLN Take 5 mLs by mouth 4 (four) times daily as needed for mouth pain. 240 mL 0   magnesium 30 MG tablet Take 30 mg by mouth at bedtime.      mesalamine (APRISO) 0.375 g 24 hr capsule TAKE TWO CAPSULES BY MOUTH TWICE DAILY 120 capsule 2   Multiple Vitamins-Minerals (AIRBORNE) CHEW Chew 1 tablet by mouth daily.     Multiple Vitamins-Minerals (PRESERVISION AREDS 2 PO) Take 1 tablet by mouth 2 (two) times daily with a meal.     NON FORMULARY Eye In jection every 14  weeks. Both eyes are injected. Last injection was 02/23/2020.     Omega-3 Fatty Acids (FISH OIL) 1200 MG CAPS Take 1,200 mg by mouth daily.     ondansetron (ZOFRAN ODT) 4 MG disintegrating tablet Take 1 tablet (4 mg total) by mouth every 8 (eight) hours as needed. (Patient not taking: Reported on 07/17/2022) 10 tablet 1   OVER THE COUNTER MEDICATION Soothe Eye drops uses two - three times per day.     potassium chloride (KLOR-CON M) 10 MEQ tablet TAKE 1 TABLET BY MOUTH EVERY DAY 30 tablet 1   warfarin (COUMADIN) 5 MG tablet TAKE 1 TABLET BY MOUTH DAILY OR AS DIRECTED BY COUMADIN CLINIC 40 tablet 6   No current facility-administered medications for this visit.    SURGICAL HISTORY:  Past Surgical History:  Procedure Laterality Date   Bladder tack  1996   CARDIOVERSION  11/02/09   x 2   CATARACT EXTRACTION  2010/2011   COLONOSCOPY  3 YRS AGO   COLONOSCOPY  03/06/2011   Procedure: COLONOSCOPY;  Surgeon: Malissa Hippo, MD;  [facility]: [facility];  Service: Endoscopy;  Laterality: N/A;  7:30 am   ESOPHAGOGASTRODUODENOSCOPY     FLEXIBLE SIGMOIDOSCOPY N/A 10/04/2012   Procedure: FLEXIBLE SIGMOIDOSCOPY;  Surgeon: Malissa Hippo, MD;  Location: [facility];  Service: Endoscopy;  Laterality: N/A;  11:00-moved to 1015 Ann to notify pt   THYROID SURGERY  1961   TONSILLECTOMY     VIDEO BRONCHOSCOPY  05/31/2011   Procedure: VIDEO BRONCHOSCOPY;  Surgeon: Delight Ovens, MD;  Location: [facility] OR;  Service: Thoracic;  Laterality: N/A;    REVIEW OF SYSTEMS:  Constitutional: negative Eyes: negative Ears, nose, mouth, throat, and face: negative Respiratory: negative Cardiovascular: negative Gastrointestinal: negative Genitourinary:negative Integument/breast: negative Hematologic/lymphatic: negative Musculoskeletal:negative Neurological: negative Behavioral/Psych: negative Endocrine: negative Allergic/Immunologic: negative   PHYSICAL EXAMINATION: General appearance: alert, cooperative, and no  distress Head: Normocephalic, without obvious abnormality, atraumatic Neck: no adenopathy, no JVD, supple, symmetrical, trachea midline, and thyroid not enlarged, symmetric, no tenderness/mass/nodules Lymph nodes: Cervical, supraclavicular, and axillary nodes normal. Resp: clear to auscultation bilaterally Back: symmetric, no curvature. ROM normal. No CVA tenderness. Cardio: regular rate and rhythm, S1, S2 normal, no murmur, click, rub or gallop GI: soft, non-tender; bowel sounds normal; no masses,  no organomegaly Extremities: extremities normal, atraumatic, no cyanosis or edema Neurologic: Alert and oriented X 3, normal strength and tone. Normal symmetric reflexes. Normal coordination and gait  ECOG PERFORMANCE STATUS: 0 - Asymptomatic  Blood pressure (!) 152/83, pulse 69, temperature 97.6 F (36.4 C), temperature source Oral, resp. rate 14, height 5\' 3"  (1.6 m), weight 97 lb 1.6 oz (44 kg), SpO2 97 %.  LABORATORY DATA: Lab Results  Component Value Date   WBC 5.6 10/17/2022   HGB 13.8 10/17/2022   HCT  41.9 10/17/2022   MCV 94.2 10/17/2022   PLT 218 10/17/2022      Chemistry      Component Value Date/Time   NA 137 10/17/2022 1325   NA 141 05/14/2017 1304   K 4.2 10/17/2022 1325   K 4.3 05/14/2017 1304   CL 100 10/17/2022 1325   CO2 29 10/17/2022 1325   CO2 30 (H) 05/14/2017 1304   BUN 26 (H) 10/17/2022 1325   BUN 19.0 05/14/2017 1304   CREATININE 0.94 10/17/2022 1325   CREATININE 0.81 07/17/2022 1029   CREATININE 0.8 05/14/2017 1304      Component Value Date/Time   CALCIUM 9.4 10/17/2022 1325   CALCIUM 9.1 05/14/2017 1304   ALKPHOS 76 10/17/2022 1325   ALKPHOS 86 05/14/2017 1304   AST 33 10/17/2022 1325   AST 27 07/17/2022 1029   AST 25 05/14/2017 1304   ALT 21 10/17/2022 1325   ALT 16 07/17/2022 1029   ALT 15 05/14/2017 1304   BILITOT 0.9 10/17/2022 1325   BILITOT 0.6 07/17/2022 1029   BILITOT 0.57 05/14/2017 1304       RADIOGRAPHIC STUDIES: CT Chest W  Contrast  Result Date: 10/21/2022 CLINICAL DATA:  Small-cell lung cancer restaging * Tracking Code: BO * EXAM: CT CHEST WITH CONTRAST TECHNIQUE: Multidetector CT imaging of the chest was performed during intravenous contrast administration. RADIATION DOSE REDUCTION: This exam was performed according to the departmental dose-optimization program which includes automated exposure control, adjustment of the mA and/or kV according to patient size and/or use of iterative reconstruction technique. CONTRAST:  65mL OMNIPAQUE IOHEXOL 300 MG/ML  SOLN COMPARISON:  04/05/2022 FINDINGS: Cardiovascular: Aortic atherosclerosis. Global cardiomegaly with enlargement of the atria. Left and right coronary artery calcifications. No pericardial effusion. Mediastinum/Nodes: No enlarged mediastinal, hilar, or axillary lymph nodes. Thyroid gland, trachea, and esophagus demonstrate no significant findings. Lungs/Pleura: Status post right upper lobectomy. Unchanged mild irregular peripheral interstitial opacity and septal thickening at the bilateral lung bases. Occasional unchanged small bilateral pulmonary nodules, measuring 0.5 cm and smaller, definitively benign, for example in the left pulmonary apex (series 4, image 17) and in the lateral segment right middle lobe (series 4, image 61). No new nodules. No pleural effusion or pneumothorax. Upper Abdomen: No acute abnormality. Musculoskeletal: No chest wall abnormality. No acute osseous findings. IMPRESSION: 1. Status post right upper lobectomy. No evidence of recurrent or metastatic disease in the chest. 2. Occasional unchanged small bilateral pulmonary nodules, measuring 0.5 cm and smaller, definitively benign. No new nodules. 3. Unchanged mild irregular peripheral interstitial opacity and septal thickening at the bilateral lung bases, consistent with mild pulmonary fibrosis. 4. Cardiomegaly and coronary artery disease with biatrial enlargement. Aortic Atherosclerosis (ICD10-I70.0).  Electronically Signed   By: Jearld Lesch M.D.   On: 10/21/2022 13:59     ASSESSMENT AND PLAN:  This is a very pleasant 89 years old white female with recurrent non-small cell lung cancer, adenocarcinoma with positive EGFR mutation. The patient has been on treatment with Iressa 250 mg p.o. daily for the last 90 months. The patient has been tolerating her treatment with Tagrisso fairly well with no concerning adverse effects. She had repeat CT scan of the chest performed recently.  I personally and independently reviewed the scan and discussed the result with the patient today. Her scan showed no concerning findings for disease progression. I recommended for the patient to continue her current treatment with Iressa. I will see her back for follow-up visit in 3 months for evaluation and repeat  blood work. She was advised to call immediately if she has any other concerning symptoms in the interval. The patient voices understanding of current disease status and treatment options and is in agreement with the current care plan. All questions were answered. The patient knows to call the clinic with any problems, questions or concerns. We can certainly see the patient much sooner if necessary.  Disclaimer: This note was dictated with voice recognition software. Similar sounding words can inadvertently be transcribed and may not be corrected upon review.

## 2022-10-27 ENCOUNTER — Other Ambulatory Visit: Payer: Self-pay

## 2022-10-27 ENCOUNTER — Other Ambulatory Visit (HOSPITAL_COMMUNITY): Payer: Self-pay

## 2022-10-30 ENCOUNTER — Other Ambulatory Visit (HOSPITAL_COMMUNITY): Payer: Self-pay

## 2022-11-09 ENCOUNTER — Ambulatory Visit: Payer: Medicare Other | Attending: Cardiology | Admitting: *Deleted

## 2022-11-09 DIAGNOSIS — I4891 Unspecified atrial fibrillation: Secondary | ICD-10-CM | POA: Diagnosis not present

## 2022-11-09 DIAGNOSIS — Z5181 Encounter for therapeutic drug level monitoring: Secondary | ICD-10-CM | POA: Diagnosis not present

## 2022-11-09 LAB — POCT INR: INR: 2 (ref 2.0–3.0)

## 2022-11-09 NOTE — Patient Instructions (Signed)
Continue warfarin 1 tablet daily  Continue broccoli  Recheck in 6 wks 

## 2022-11-14 ENCOUNTER — Other Ambulatory Visit (INDEPENDENT_AMBULATORY_CARE_PROVIDER_SITE_OTHER): Payer: Self-pay | Admitting: Gastroenterology

## 2022-11-17 ENCOUNTER — Encounter: Payer: Self-pay | Admitting: Nurse Practitioner

## 2022-11-17 ENCOUNTER — Ambulatory Visit: Payer: Medicare Other | Attending: Nurse Practitioner | Admitting: Nurse Practitioner

## 2022-11-17 VITALS — BP 126/78 | HR 76 | Ht 62.0 in | Wt 97.2 lb

## 2022-11-17 DIAGNOSIS — I4821 Permanent atrial fibrillation: Secondary | ICD-10-CM | POA: Diagnosis not present

## 2022-11-17 DIAGNOSIS — E785 Hyperlipidemia, unspecified: Secondary | ICD-10-CM | POA: Diagnosis not present

## 2022-11-17 NOTE — Patient Instructions (Signed)
Medication Instructions:  Your physician recommends that you continue on your current medications as directed. Please refer to the Current Medication list given to you today.  Labwork: none  Testing/Procedures: none  Follow-Up: Your physician recommends that you schedule a follow-up appointment in: 1 Year with Diona Browner  Any Other Special Instructions Will Be Listed Below (If Applicable).  If you need a refill on your cardiac medications before your next appointment, please call your pharmacy.

## 2022-11-17 NOTE — Progress Notes (Signed)
Cardiology Office Note:  .   Date:  11/17/2022  ID:  Frutoso Chase, DOB 1929-09-22, MRN [id number] PCP: [name], MD  [facility] Providers Cardiologist:  Nona Dell, MD    History of Present Illness: .   Natalie Carlson is a 89 y.o. female with a PMH of permanent A-fib, hx of lung cancer, s/p VATS and RUL lobectomy in 2013, HLD, hypothyroidism, and macular degeneration, who presents today for 1 year follow-up.   Last seen by Dr. Diona Browner on Sep 26, 2021. Was doing very well at the time.   Today she presents for follow-up. She continues to do well.  She remains very active and independent at 89 years old, continues to drive. Denies any chest pain, shortness of breath, syncope, presyncope, dizziness, orthopnea, PND, swelling or significant weight changes, acute bleeding, or claudication.  Occasional palpitations with her A-fib with extreme exertional activities, brief in duration.  States if this happens, she will rest and take deep breaths and her symptoms resolve.   Studies Reviewed: Marland Kitchen    EKG Interpretation Date/Time:  Friday November 17 2022 13:48:13 EDT Ventricular Rate:  77 PR Interval:    QRS Duration:  86 QT Interval:  354 QTC Calculation: 400 R Axis:   99  Text Interpretation: Atrial fibrillation Biventricular hypertrophy Septal infarct (cited on or before 29-May-2011) Lateral infarct (cited on or before 29-May-2011) When compared with ECG of 07-Jan-2014 14:23, Questionable change in initial forces of Lateral leads Confirmed by Sharlene Dory [phone]) on 11/17/2022 2:14:20 PM    Echocardiogram 01/2014:  - Left ventricle: The cavity size was normal. Wall thickness was    normal. Systolic function was normal. The estimated ejection    fraction was in the range of 60% to 65%. Wall motion was normal;    there were no regional wall motion abnormalities. The study is    not technically sufficient to allow evaluation of LV diastolic    function.  - Aortic valve: Mildly  calcified annulus. Trileaflet. There was    trivial regurgitation.  - Mitral valve: Calcified annulus. There was mild regurgitation.  - Right ventricle: The cavity size was mildly to moderately    dilated. Systolic function was normal.  - Right atrium: The atrium was mildly dilated. Central venous    pressure (est): 3 mm Hg.  - Atrial septum: No defect or patent foramen ovale was identified.  - Tricuspid valve: There was mild-moderate regurgitation.  - Pulmonary arteries: PA peak pressure: 34 mm Hg (S).  - Pericardium, extracardiac: There was no pericardial effusion.   Risk Assessment/Calculations:    CHA2DS2-VASc Score = 3   This indicates a 3.2% annual risk of stroke. The patient's score is based upon: CHF History: 0 HTN History: 0 Diabetes History: 0 Stroke History: 0 Vascular Disease History: 0 Age Score: 2 Gender Score: 1  Physical Exam:   VS:  BP 126/78   Pulse 76   Ht 5\' 2"  (1.575 m)   Wt 97 lb 3.2 oz (44.1 kg)   SpO2 94%   BMI 17.78 kg/m    Wt Readings from Last 3 Encounters:  11/17/22 97 lb 3.2 oz (44.1 kg)  10/23/22 97 lb 1.6 oz (44 kg)  07/17/22 96 lb (43.5 kg)    GEN: Thin, frail 89 year old female in no acute distress NECK: No JVD; No carotid bruits CARDIAC: S1/S2, RRR, no murmurs, rubs, gallops RESPIRATORY:  Clear to auscultation without rales, wheezing or rhonchi  ABDOMEN: Soft, non-tender,  non-distended EXTREMITIES:  No edema; No deformity   ASSESSMENT AND PLAN: .    Permanent A-fib Occasional palpitations, brief in duration. HR well controlled today. Continue Diltiazem. Denies any bleeding issues on Coumadin, continue current medical management. Continue to [facility]. Heart healthy diet and regular cardiovascular exercise encouraged.   2.  HLD LDL 121 08/2021. Due for repeat labs with Dr. Reuel Boom soon. States she could not tolerate past cholesterol medication, unsure of name. Did discuss lipid lowering medications and importance  of medication therapy. She politely declines at this time. Recommend revisiting this at next OV. Continue to follow with PCP. Heart healthy diet and regular cardiovascular exercise encouraged.   Dispo: Follow-up with Dr. Diona Browner or [facility] in 1 year or sooner if anything changes.   Signed, Sharlene Dory, NP

## 2022-11-23 ENCOUNTER — Other Ambulatory Visit (HOSPITAL_COMMUNITY): Payer: Self-pay

## 2022-11-27 ENCOUNTER — Other Ambulatory Visit (HOSPITAL_COMMUNITY): Payer: Self-pay

## 2022-11-28 DIAGNOSIS — H353223 Exudative age-related macular degeneration, left eye, with inactive scar: Secondary | ICD-10-CM | POA: Diagnosis not present

## 2022-11-28 DIAGNOSIS — H35033 Hypertensive retinopathy, bilateral: Secondary | ICD-10-CM | POA: Diagnosis not present

## 2022-11-28 DIAGNOSIS — H43813 Vitreous degeneration, bilateral: Secondary | ICD-10-CM | POA: Diagnosis not present

## 2022-11-28 DIAGNOSIS — H353211 Exudative age-related macular degeneration, right eye, with active choroidal neovascularization: Secondary | ICD-10-CM | POA: Diagnosis not present

## 2022-11-28 DIAGNOSIS — H35371 Puckering of macula, right eye: Secondary | ICD-10-CM | POA: Diagnosis not present

## 2022-11-29 ENCOUNTER — Other Ambulatory Visit (HOSPITAL_COMMUNITY): Payer: Self-pay

## 2022-12-13 DIAGNOSIS — Z681 Body mass index (BMI) 19 or less, adult: Secondary | ICD-10-CM | POA: Diagnosis not present

## 2022-12-13 DIAGNOSIS — R509 Fever, unspecified: Secondary | ICD-10-CM | POA: Diagnosis not present

## 2022-12-26 ENCOUNTER — Other Ambulatory Visit (HOSPITAL_COMMUNITY): Payer: Self-pay

## 2022-12-28 ENCOUNTER — Ambulatory Visit: Payer: Medicare Other | Attending: Cardiology | Admitting: *Deleted

## 2022-12-28 DIAGNOSIS — Z5181 Encounter for therapeutic drug level monitoring: Secondary | ICD-10-CM | POA: Diagnosis not present

## 2022-12-28 DIAGNOSIS — I4891 Unspecified atrial fibrillation: Secondary | ICD-10-CM | POA: Diagnosis not present

## 2022-12-28 LAB — POCT INR: INR: 2.6 (ref 2.0–3.0)

## 2022-12-28 NOTE — Patient Instructions (Signed)
Continue warfarin 1 tablet daily  Continue broccoli  Recheck in 6 wks 

## 2022-12-29 ENCOUNTER — Other Ambulatory Visit (HOSPITAL_COMMUNITY): Payer: Self-pay

## 2022-12-29 ENCOUNTER — Other Ambulatory Visit: Payer: Self-pay

## 2023-01-01 ENCOUNTER — Other Ambulatory Visit (HOSPITAL_COMMUNITY): Payer: Self-pay

## 2023-01-01 ENCOUNTER — Other Ambulatory Visit: Payer: Self-pay

## 2023-01-02 ENCOUNTER — Other Ambulatory Visit: Payer: Self-pay

## 2023-01-02 ENCOUNTER — Other Ambulatory Visit (HOSPITAL_COMMUNITY): Payer: Self-pay

## 2023-01-03 ENCOUNTER — Other Ambulatory Visit: Payer: Self-pay

## 2023-01-03 ENCOUNTER — Other Ambulatory Visit (HOSPITAL_COMMUNITY): Payer: Self-pay

## 2023-01-03 ENCOUNTER — Emergency Department (HOSPITAL_COMMUNITY)
Admission: EM | Admit: 2023-01-03 | Discharge: 2023-01-03 | Disposition: A | Discharge status: Home / Self Care | Payer: Medicare Other | Attending: Emergency Medicine | Admitting: Emergency Medicine

## 2023-01-03 ENCOUNTER — Emergency Department (HOSPITAL_COMMUNITY): Payer: Medicare Other

## 2023-01-03 ENCOUNTER — Encounter (HOSPITAL_COMMUNITY): Payer: Self-pay

## 2023-01-03 DIAGNOSIS — I4891 Unspecified atrial fibrillation: Secondary | ICD-10-CM | POA: Insufficient documentation

## 2023-01-03 DIAGNOSIS — Z7901 Long term (current) use of anticoagulants: Secondary | ICD-10-CM | POA: Diagnosis not present

## 2023-01-03 DIAGNOSIS — R42 Dizziness and giddiness: Secondary | ICD-10-CM | POA: Diagnosis not present

## 2023-01-03 DIAGNOSIS — I10 Essential (primary) hypertension: Secondary | ICD-10-CM | POA: Insufficient documentation

## 2023-01-03 DIAGNOSIS — I517 Cardiomegaly: Secondary | ICD-10-CM | POA: Diagnosis not present

## 2023-01-03 DIAGNOSIS — Z79899 Other long term (current) drug therapy: Secondary | ICD-10-CM | POA: Insufficient documentation

## 2023-01-03 DIAGNOSIS — C3490 Malignant neoplasm of unspecified part of unspecified bronchus or lung: Secondary | ICD-10-CM | POA: Diagnosis not present

## 2023-01-03 DIAGNOSIS — R531 Weakness: Secondary | ICD-10-CM | POA: Diagnosis not present

## 2023-01-03 LAB — CBC WITH DIFFERENTIAL/PLATELET
Abs Immature Granulocytes: 0.01 K/uL (ref 0.00–0.07)
Basophils Absolute: 0 K/uL (ref 0.0–0.1)
Basophils Relative: 1 %
Eosinophils Absolute: 0.2 K/uL (ref 0.0–0.5)
Eosinophils Relative: 3 %
HCT: 43 % (ref 36.0–46.0)
Hemoglobin: 14 g/dL (ref 12.0–15.0)
Immature Granulocytes: 0 %
Lymphocytes Relative: 26 %
Lymphs Abs: 1.3 K/uL (ref 0.7–4.0)
MCH: 30.4 pg (ref 26.0–34.0)
MCHC: 32.6 g/dL (ref 30.0–36.0)
MCV: 93.3 fL (ref 80.0–100.0)
Monocytes Absolute: 0.8 K/uL (ref 0.1–1.0)
Monocytes Relative: 15 %
Neutro Abs: 2.8 K/uL (ref 1.7–7.7)
Neutrophils Relative %: 55 %
Platelets: 238 K/uL (ref 150–400)
RBC: 4.61 MIL/uL (ref 3.87–5.11)
RDW: 13.6 % (ref 11.5–15.5)
WBC: 5.1 K/uL (ref 4.0–10.5)
nRBC: 0 % (ref 0.0–0.2)

## 2023-01-03 LAB — COMPREHENSIVE METABOLIC PANEL WITH GFR
ALT: 19 U/L (ref 0–44)
AST: 31 U/L (ref 15–41)
Albumin: 4 g/dL (ref 3.5–5.0)
Alkaline Phosphatase: 75 U/L (ref 38–126)
Anion gap: 9 (ref 5–15)
BUN: 24 mg/dL — ABNORMAL HIGH (ref 8–23)
CO2: 27 mmol/L (ref 22–32)
Calcium: 8.9 mg/dL (ref 8.9–10.3)
Chloride: 102 mmol/L (ref 98–111)
Creatinine, Ser: 0.85 mg/dL (ref 0.44–1.00)
GFR, Estimated: >60 mL/min
Glucose, Bld: 96 mg/dL (ref 70–99)
Potassium: 4.1 mmol/L (ref 3.5–5.1)
Sodium: 138 mmol/L (ref 135–145)
Total Bilirubin: 0.8 mg/dL (ref 0.3–1.2)
Total Protein: 7.4 g/dL (ref 6.5–8.1)

## 2023-01-03 LAB — URINALYSIS, ROUTINE W REFLEX MICROSCOPIC
Bilirubin Urine: NEGATIVE
Glucose, UA: NEGATIVE mg/dL
Ketones, ur: NEGATIVE mg/dL
Nitrite: NEGATIVE
Protein, ur: NEGATIVE mg/dL
Specific Gravity, Urine: 1.005 (ref 1.005–1.030)
pH: 6 (ref 5.0–8.0)

## 2023-01-03 LAB — TSH: TSH: 4.404 u[IU]/mL (ref 0.350–4.500)

## 2023-01-03 LAB — MAGNESIUM: Magnesium: 2 mg/dL (ref 1.7–2.4)

## 2023-01-03 LAB — TROPONIN I (HIGH SENSITIVITY): Troponin I (High Sensitivity): 16 ng/L

## 2023-01-03 LAB — PROTIME-INR
INR: 3.2 — ABNORMAL HIGH (ref 0.8–1.2)
Prothrombin Time: 33 seconds — ABNORMAL HIGH (ref 11.4–15.2)

## 2023-01-03 NOTE — ED Notes (Signed)
Discharge instructions provided by edp were reinforced to pt and pts family. Pt/family were able to verbalize understanding with no additional questions at this time.

## 2023-01-03 NOTE — ED Notes (Signed)
XR at bedside

## 2023-01-03 NOTE — ED Triage Notes (Addendum)
Pt arrives via POV with feeling flushed, shaky, and states she thinks "its my afib." Denies chest pain.

## 2023-01-03 NOTE — ED Provider Notes (Signed)
EMERGENCY DEPARTMENT AT [facility] Provider Note   CSN: [id number] Arrival date & time: 01/03/23  1841     History  Chief Complaint  Patient presents with   Dizziness    Natalie Carlson is a 89 y.o. female.   Dizziness  This patient is a 89 year old female, she has a history of persistent atrial fibrillation on diltiazem and warfarin, she also has a history of lung cancer on oral daily chemo for many years.  She presents to the hospital today stating that she felt a rather acute onset of a feeling of being flushed, she had generalized weakness, she denies chest pain or shortness of breath to me she has not been coughing, she was diagnosed with COVID-19 as what she describes as a mild cold several weeks ago but this resolved completely.  She was otherwise in her usual state of health prior to this occurring.  She denies urinary symptoms, she does take occasional furosemide but she takes it as needed.  She denies any rashes, no headaches, no blurred vision, no sore throat    Home Medications Prior to Admission medications   Medication Sig Start Date End Date Taking? Authorizing Provider  acetaminophen (TYLENOL) 500 MG tablet Take 500 mg by mouth. Patient takes as needed , which is seldom.   Yes [provider]  Biotin 10 MG TABS Take 1 tablet by mouth daily.   Yes [provider]  Calcium Carbonate (CALCIUM 600 PO) Take 1 tablet by mouth 2 (two) times daily.   Yes [provider]  Cholecalciferol (D3 5000) 125 MCG (5000 UT) capsule Take 5,000 Units by mouth daily.   Yes [provider]  diltiazem (CARDIZEM CD) 180 MG 24 hr capsule TAKE ONE CAPSULE BY MOUTH EVERY DAY 10/16/22  Yes Jonelle Sidle, MD  furosemide (LASIX) 20 MG tablet TAKE 1 TABLET BY MOUTH EVERY DAY AS NEEDED FOR FLUID 08/16/22  Yes Jonelle Sidle, MD  gefitinib (IRESSA) 250 MG tablet Take 1 tablet (250 mg total) by mouth daily. 10/03/22  Yes Si Gaul, MD   loperamide (IMODIUM A-D) 2 MG tablet Take 1 mg by mouth daily.   Yes [provider]  mesalamine (APRISO) 0.375 g 24 hr capsule TAKE TWO CAPSULES BY MOUTH TWICE DAILY 11/14/22  Yes Carlan, Chelsea L, NP  Multiple Vitamins-Minerals (PRESERVISION AREDS 2 PO) Take 1 tablet by mouth 2 (two) times daily with a meal.   Yes [provider]  NON FORMULARY Eye In jection every 14 weeks. Both eyes are injected. Last injection was 02/23/2020.   Yes [provider]  Omega-3 Fatty Acids (FISH OIL) 1200 MG CAPS Take 1,200 mg by mouth daily.   Yes [provider]  OVER THE COUNTER MEDICATION Place 1 drop into both eyes See admin instructions. Soothe Eye drops uses 6-8 times per day.   Yes [provider]  potassium chloride (KLOR-CON M) 10 MEQ tablet TAKE 1 TABLET BY MOUTH EVERY DAY 08/16/22  Yes Jonelle Sidle, MD  warfarin (COUMADIN) 5 MG tablet TAKE 1 TABLET BY MOUTH DAILY OR AS DIRECTED BY COUMADIN CLINIC 04/17/22  Yes Jonelle Sidle, MD  Diphenhyd-Hydrocort-Nystatin (FIRST-DUKES MOUTHWASH MT) Take 10 mLs by mouth every 6 (six) hours as needed (oral inflammation). 07/12/22   [provider]  Multiple Vitamins-Minerals (AIRBORNE) CHEW Chew 1 tablet by mouth daily. Patient not taking: Reported on 01/03/2023    [provider]      Allergies  Doxycycline, Entocort ec [budesonide], Neurontin [gabapentin], Vicodin [hydrocodone-acetaminophen], Bactrim [sulfamethoxazole-trimethoprim], Cephalexin, Adhesive [tape], Amoxicillin, Chocolate, Codeine, Levaquin [levofloxacin in d5w], Morphine and codeine, Naprosyn [naproxen], Penicillins, Prednisone, Tramadol, and Uncoded nonscreenable allergen    Review of Systems   Review of Systems  Neurological:  Positive for dizziness.  All other systems reviewed and are negative.   Physical Exam Updated Vital Signs BP (!) 153/95   Pulse 95   Temp 97.8 F (36.6 C) (Oral)   Resp 17   Ht 1.575 m (5\' 2" )    Wt 44 kg   SpO2 94%   BMI 17.74 kg/m  Physical Exam Vitals and nursing note reviewed.  Constitutional:      General: She is not in acute distress.    Appearance: She is well-developed.  HENT:     Head: Normocephalic and atraumatic.     Mouth/Throat:     Pharynx: No oropharyngeal exudate.  Eyes:     General: No scleral icterus.       Right eye: No discharge.        Left eye: No discharge.     Conjunctiva/sclera: Conjunctivae normal.     Pupils: Pupils are equal, round, and reactive to light.  Neck:     Thyroid: No thyromegaly.     Vascular: No JVD.  Cardiovascular:     Rate and Rhythm: Normal rate. Rhythm irregular.     Heart sounds: Normal heart sounds. No murmur heard.    No friction rub. No gallop.  Pulmonary:     Effort: Pulmonary effort is normal. No respiratory distress.     Breath sounds: Normal breath sounds. No wheezing or rales.  Abdominal:     General: Bowel sounds are normal. There is no distension.     Palpations: Abdomen is soft. There is no mass.     Tenderness: There is no abdominal tenderness.  Musculoskeletal:        General: No tenderness. Normal range of motion.     Cervical back: Normal range of motion and neck supple.     Right lower leg: No edema.     Left lower leg: No edema.  Lymphadenopathy:     Cervical: No cervical adenopathy.  Skin:    General: Skin is warm and dry.     Findings: No erythema or rash.  Neurological:     Mental Status: She is alert.     Coordination: Coordination normal.  Psychiatric:        Behavior: Behavior normal.     ED Results / Procedures / Treatments   Labs (all labs ordered are listed, but only abnormal results are displayed) Labs Reviewed  COMPREHENSIVE METABOLIC PANEL - Abnormal; Notable for the following components:      Result Value   BUN 24 (*)    All other components within normal limits  URINALYSIS, ROUTINE W REFLEX MICROSCOPIC - Abnormal; Notable for the following components:   Color, Urine STRAW  (*)    Hgb urine dipstick SMALL (*)    Leukocytes,Ua TRACE (*)    Bacteria, UA RARE (*)    All other components within normal limits  PROTIME-INR - Abnormal; Notable for the following components:   Prothrombin Time 33.0 (*)    INR 3.2 (*)    All other components within normal limits  CBC WITH DIFFERENTIAL/PLATELET  MAGNESIUM  TSH  TROPONIN I (HIGH SENSITIVITY)    EKG EKG Interpretation Date/Time:  Wednesday January 03 2023 18:56:13 EDT Ventricular Rate:  97 PR Interval:  QRS Duration:  80 QT Interval:  346 QTC Calculation: 439 R Axis:   94  Text Interpretation: Atrial fibrillation Biventricular hypertrophy Anterolateral infarct (cited on or before 29-May-2011) Abnormal ECG When compared with ECG of 17-Nov-2022 13:48, No significant change was found since last tracing no significant change Confirmed by Eber Hong ([id number]) on 01/03/2023 7:22:06 PM  Radiology DG Chest Port 1 View  Result Date: 01/03/2023 CLINICAL DATA:  Weakness EXAM: PORTABLE CHEST 1 VIEW COMPARISON:  Chest x-ray 03/29/2015 FINDINGS: Heart is enlarged. The lungs are clear. No pleural effusion or pneumothorax. No acute fractures. IMPRESSION: Cardiomegaly. No acute pulmonary process. Electronically Signed   By: Darliss Cheney M.D.   On: 01/03/2023 20:56    Procedures Procedures    Medications Ordered in ED Medications - No data to display  ED Course/ Medical Decision Making/ A&P                                 Medical Decision Making Amount and/or Complexity of Data Reviewed Labs: ordered. Radiology: ordered.    This patient presents to the ED for concern of feeling of dizziness and weakness, this involves an extensive number of treatment options, and is a complaint that carries with it a high risk of complications and morbidity.  The differential diagnosis includes electrolyte abnormalities, could have been A-fib however she has not been tachycardic here, she ranges between 80 and 100 and what appears  to be A-fib, EKG confirms this.  Could be infectious such as a urinary tract infection but otherwise she appears well, she is not coughing or short of breath or hypoxic, she is mildly hypertensive but nothing that would give acute symptoms I would not think.  She is agreeable to the workup   Co morbidities that complicate the patient evaluation  Lung cancer, persistent A-fib   Additional history obtained:  Additional history obtained from the record External records from outside source obtained and reviewed including prior EKGs and cardiology notes   Lab Tests:  I Ordered, and personally interpreted labs.  The pertinent results include: Urinalysis, CBC, troponin, metabolic panel, magnesium, TSH all unremarkable.  INR is 3.2   Imaging Studies ordered:  I ordered imaging studies including chest x-ray I independently visualized and interpreted imaging which showed cardiomegaly but no acute process I agree with the radiologist interpretation   Cardiac Monitoring: / EKG:  The patient was maintained on a cardiac monitor.  I personally viewed and interpreted the cardiac monitored which showed an underlying rhythm of: Atrial fibrillation, rate controlled   Problem List / ED Course / Critical interventions / Medication management  Patient is otherwise well-appearing, no significant symptoms at the time of discharge, blood pressure rechecked at discharge 153/95, she is afebrile she is not tachycardic, she is not ill-appearing, she has no other symptoms, she is comfortable with the plan of discharge and agreeable to come back should symptoms worsen, family at the bedside also agreeable. I have reviewed the patients home medicines and have made adjustments as needed   Social Determinants of Health:  Elderly, A-fib   Test / Admission - Considered:  Considered admission but the patient has had a reassuring workup and is comfortable with the plan of discharge         Final  Clinical Impression(s) / ED Diagnoses Final diagnoses:  Dizziness  Essential hypertension    Rx / DC Orders ED Discharge Orders  None         Eber Hong, MD 01/03/23 2211

## 2023-01-03 NOTE — Discharge Instructions (Signed)
Thankfully all of your testing tonight was reassuring, your blood pressure was a little bit high but nothing that is concerning.  Take your medications when you get home, see your doctor this week for recheck, ER for severe or worsening symptoms

## 2023-01-08 DIAGNOSIS — I10 Essential (primary) hypertension: Secondary | ICD-10-CM | POA: Diagnosis not present

## 2023-01-08 DIAGNOSIS — I700 Atherosclerosis of aorta: Secondary | ICD-10-CM | POA: Diagnosis not present

## 2023-01-08 DIAGNOSIS — I4821 Permanent atrial fibrillation: Secondary | ICD-10-CM | POA: Diagnosis not present

## 2023-01-11 ENCOUNTER — Other Ambulatory Visit: Payer: Self-pay | Admitting: Cardiology

## 2023-01-18 ENCOUNTER — Other Ambulatory Visit (HOSPITAL_COMMUNITY): Payer: Self-pay

## 2023-01-22 ENCOUNTER — Inpatient Hospital Stay: Payer: Medicare Other | Attending: Physician Assistant

## 2023-01-22 ENCOUNTER — Inpatient Hospital Stay: Payer: Medicare Other | Admitting: Internal Medicine

## 2023-01-22 ENCOUNTER — Other Ambulatory Visit (HOSPITAL_COMMUNITY): Payer: Self-pay

## 2023-01-22 VITALS — BP 162/101 | HR 74 | Temp 98.1°F | Resp 16 | Ht 62.0 in | Wt 97.7 lb

## 2023-01-22 DIAGNOSIS — Z7901 Long term (current) use of anticoagulants: Secondary | ICD-10-CM | POA: Insufficient documentation

## 2023-01-22 DIAGNOSIS — Z885 Allergy status to narcotic agent status: Secondary | ICD-10-CM | POA: Diagnosis not present

## 2023-01-22 DIAGNOSIS — Z886 Allergy status to analgesic agent status: Secondary | ICD-10-CM | POA: Insufficient documentation

## 2023-01-22 DIAGNOSIS — I119 Hypertensive heart disease without heart failure: Secondary | ICD-10-CM | POA: Diagnosis not present

## 2023-01-22 DIAGNOSIS — E785 Hyperlipidemia, unspecified: Secondary | ICD-10-CM | POA: Diagnosis not present

## 2023-01-22 DIAGNOSIS — C3490 Malignant neoplasm of unspecified part of unspecified bronchus or lung: Secondary | ICD-10-CM | POA: Diagnosis not present

## 2023-01-22 DIAGNOSIS — Z881 Allergy status to other antibiotic agents status: Secondary | ICD-10-CM | POA: Diagnosis not present

## 2023-01-22 DIAGNOSIS — Z79899 Other long term (current) drug therapy: Secondary | ICD-10-CM | POA: Insufficient documentation

## 2023-01-22 DIAGNOSIS — I4891 Unspecified atrial fibrillation: Secondary | ICD-10-CM | POA: Diagnosis not present

## 2023-01-22 DIAGNOSIS — Z9089 Acquired absence of other organs: Secondary | ICD-10-CM | POA: Insufficient documentation

## 2023-01-22 DIAGNOSIS — Z888 Allergy status to other drugs, medicaments and biological substances status: Secondary | ICD-10-CM | POA: Diagnosis not present

## 2023-01-22 DIAGNOSIS — Z880 Allergy status to penicillin: Secondary | ICD-10-CM | POA: Insufficient documentation

## 2023-01-22 DIAGNOSIS — Z8719 Personal history of other diseases of the digestive system: Secondary | ICD-10-CM | POA: Diagnosis not present

## 2023-01-22 DIAGNOSIS — C3411 Malignant neoplasm of upper lobe, right bronchus or lung: Secondary | ICD-10-CM | POA: Diagnosis not present

## 2023-01-22 LAB — CBC WITH DIFFERENTIAL (CANCER CENTER ONLY)
Abs Immature Granulocytes: 0.01 10*3/uL (ref 0.00–0.07)
Basophils Absolute: 0 10*3/uL (ref 0.0–0.1)
Basophils Relative: 1 %
Eosinophils Absolute: 0.2 10*3/uL (ref 0.0–0.5)
Eosinophils Relative: 3 %
HCT: 40.7 % (ref 36.0–46.0)
Hemoglobin: 13.6 g/dL (ref 12.0–15.0)
Immature Granulocytes: 0 %
Lymphocytes Relative: 19 %
Lymphs Abs: 1 10*3/uL (ref 0.7–4.0)
MCH: 31.2 pg (ref 26.0–34.0)
MCHC: 33.4 g/dL (ref 30.0–36.0)
MCV: 93.3 fL (ref 80.0–100.0)
Monocytes Absolute: 0.7 10*3/uL (ref 0.1–1.0)
Monocytes Relative: 14 %
Neutro Abs: 3.3 10*3/uL (ref 1.7–7.7)
Neutrophils Relative %: 63 %
Platelet Count: 231 10*3/uL (ref 150–400)
RBC: 4.36 MIL/uL (ref 3.87–5.11)
RDW: 14 % (ref 11.5–15.5)
WBC Count: 5.3 10*3/uL (ref 4.0–10.5)
nRBC: 0 % (ref 0.0–0.2)

## 2023-01-22 LAB — CMP (CANCER CENTER ONLY)
ALT: 14 U/L (ref 0–44)
AST: 25 U/L (ref 15–41)
Albumin: 4.1 g/dL (ref 3.5–5.0)
Alkaline Phosphatase: 73 U/L (ref 38–126)
Anion gap: 6 (ref 5–15)
BUN: 21 mg/dL (ref 8–23)
CO2: 30 mmol/L (ref 22–32)
Calcium: 9.4 mg/dL (ref 8.9–10.3)
Chloride: 103 mmol/L (ref 98–111)
Creatinine: 0.83 mg/dL (ref 0.44–1.00)
GFR, Estimated: >60 mL/min (ref >60)
Glucose, Bld: 97 mg/dL (ref 70–99)
Potassium: 4.1 mmol/L (ref 3.5–5.1)
Sodium: 139 mmol/L (ref 135–145)
Total Bilirubin: 0.7 mg/dL (ref 0.3–1.2)
Total Protein: 7.3 g/dL (ref 6.5–8.1)

## 2023-01-22 NOTE — Progress Notes (Signed)
[facility] [phone]   [phone]  OFFICE PROGRESS NOTE  Natalie Chimera, MD [address] [place] [address]  DIAGNOSIS: Recurrent non-small cell lung cancer presented with bilateral lung nodules in October 2016 initially diagnosed as a stage IA adenocarcinoma with positive EGFR mutation with deletion in exon 86 in December 2012.  PRIOR THERAPY: Status post bronchoscopy with right VATS and right upper lobectomy with lymph node dissection under the care of Dr. Tyrone Sage on 05/31/2011.  CURRENT THERAPY: Iressa 250 mg by mouth daily started 04/17/2015, status post 93 months of treatment.  INTERVAL HISTORY: [name] 89 y.o. female returns to the clinic today for follow-up visit.  The patient is feeling fine today with no concerning complaints.  She has been dealing with some hypertension issues recently.  She denied having any current chest pain, shortness of breath, cough or hemoptysis.  She has no nausea, vomiting, diarrhea or constipation.  She has no headache or visual changes.  She denied having any fever or chills.  She continues to tolerate her treatment with Iressa fairly well.  She is here for evaluation and repeat blood work.   MEDICAL HISTORY: Past Medical History:  Diagnosis Date   Arthritis    Atrial fibrillation (HCC)    Cataract of both eyes    Chronic back pain    Chronic diarrhea    Colitis    Hemorrhoids    History of colon polyps    History of migraines    Hyperlipidemia    Hypothyroidism    Macular degeneration, dry    Malignant neoplasm of right upper lobe of lung (HCC)    Stage 1A  EGFR positive  Deletion in exon 19.  ALK-negative  Right Upper Lobe Lung Mass  SUV 2.9 on PET Status post VATS and right upper lobe lobectomy 05/31/2011   Mucus in stool    Nephrolithiasis    1994 - passed on its on    ALLERGIES:  is allergic to doxycycline, entocort ec [budesonide], neurontin [gabapentin], vicodin  [hydrocodone-acetaminophen], bactrim [sulfamethoxazole-trimethoprim], cephalexin, adhesive [tape], amoxicillin, chocolate, codeine, levaquin [levofloxacin in d5w], morphine and codeine, naprosyn [naproxen], penicillins, prednisone, tramadol, and uncoded nonscreenable allergen.  MEDICATIONS:  Current Outpatient Medications  Medication Sig Dispense Refill   acetaminophen (TYLENOL) 500 MG tablet Take 500 mg by mouth. Patient takes as needed , which is seldom.     Biotin 10 MG TABS Take 1 tablet by mouth daily.     Calcium Carbonate (CALCIUM 600 PO) Take 1 tablet by mouth 2 (two) times daily.     Cholecalciferol (D3 5000) 125 MCG (5000 UT) capsule Take 5,000 Units by mouth daily.     diltiazem (CARDIZEM CD) 180 MG 24 hr capsule TAKE ONE CAPSULE BY MOUTH EVERY DAY 90 capsule 2   Diphenhyd-Hydrocort-Nystatin (FIRST-DUKES MOUTHWASH MT) Take 10 mLs by mouth every 6 (six) hours as needed (oral inflammation).     furosemide (LASIX) 20 MG tablet TAKE 1 TABLET BY MOUTH EVERY DAY AS NEEDED FOR FLUID 30 tablet 1   gefitinib (IRESSA) 250 MG tablet Take 1 tablet (250 mg total) by mouth daily. 30 tablet 5   loperamide (IMODIUM A-D) 2 MG tablet Take 1 mg by mouth daily.     mesalamine (APRISO) 0.375 g 24 hr capsule TAKE TWO CAPSULES BY MOUTH TWICE DAILY 360 capsule 2   Multiple Vitamins-Minerals (AIRBORNE) CHEW Chew 1 tablet by mouth daily. (Patient not taking: Reported on 01/03/2023)  Multiple Vitamins-Minerals (PRESERVISION AREDS 2 PO) Take 1 tablet by mouth 2 (two) times daily with a meal.     NON FORMULARY Eye In jection every 14 weeks. Both eyes are injected. Last injection was 02/23/2020.     Omega-3 Fatty Acids (FISH OIL) 1200 MG CAPS Take 1,200 mg by mouth daily.     OVER THE COUNTER MEDICATION Place 1 drop into both eyes See admin instructions. Soothe Eye drops uses 6-8 times per day.     potassium chloride (KLOR-CON M) 10 MEQ tablet TAKE 1 TABLET BY MOUTH EVERY DAY 30 tablet 1   warfarin (COUMADIN) 5  MG tablet TAKE 1 TABLET BY MOUTH DAILY OR AS DIRECTED BY COUMADIN CLINIC 40 tablet 6   No current facility-administered medications for this visit.    SURGICAL HISTORY:  Past Surgical History:  Procedure Laterality Date   Bladder tack  1996   CARDIOVERSION  11/02/09   x 2   CATARACT EXTRACTION  2010/2011   COLONOSCOPY  3 YRS AGO   COLONOSCOPY  03/06/2011   Procedure: COLONOSCOPY;  Surgeon: Malissa Hippo, MD;  Location[facility];  Service: Endoscopy;  Laterality: N/A;  7:30 am   ESOPHAGOGASTRODUODENOSCOPY     FLEXIBLE SIGMOIDOSCOPY N/A 10/04/2012   Procedure: FLEXIBLE SIGMOIDOSCOPY;  Surgeon: Malissa Hippo, MD;  Location: [facility];  Service: Endoscopy;  Laterality: N/A;  11:00-moved to 1015 Ann to notify pt   THYROID SURGERY  1961   TONSILLECTOMY     VIDEO BRONCHOSCOPY  05/31/2011   Procedure: VIDEO BRONCHOSCOPY;  Surgeon: Delight Ovens, MD;  Location: [facility] OR;  Service: Thoracic;  Laterality: N/A;    REVIEW OF SYSTEMS:  A comprehensive review of systems was negative.   PHYSICAL EXAMINATION: General appearance: alert, cooperative, and no distress Head: Normocephalic, without obvious abnormality, atraumatic Neck: no adenopathy, no JVD, supple, symmetrical, trachea midline, and thyroid not enlarged, symmetric, no tenderness/mass/nodules Lymph nodes: Cervical, supraclavicular, and axillary nodes normal. Resp: clear to auscultation bilaterally Back: symmetric, no curvature. ROM normal. No CVA tenderness. Cardio: regular rate and rhythm, S1, S2 normal, no murmur, click, rub or gallop GI: soft, non-tender; bowel sounds normal; no masses,  no organomegaly Extremities: extremities normal, atraumatic, no cyanosis or edema  ECOG PERFORMANCE STATUS: 0 - Asymptomatic  Blood pressure (!) 162/101, pulse 74, temperature 98.1 F (36.7 C), temperature source Oral, resp. rate 16, height 5\' 2"  (1.575 m), weight 97 lb 11.2 oz (44.3 kg), SpO2 99%.  LABORATORY DATA: Lab Results   Component Value Date   WBC 5.3 01/22/2023   HGB 13.6 01/22/2023   HCT 40.7 01/22/2023   MCV 93.3 01/22/2023   PLT 231 01/22/2023      Chemistry      Component Value Date/Time   NA 138 01/03/2023 1918   NA 141 05/14/2017 1304   K 4.1 01/03/2023 1918   K 4.3 05/14/2017 1304   CL 102 01/03/2023 1918   CO2 27 01/03/2023 1918   CO2 30 (H) 05/14/2017 1304   BUN 24 (H) 01/03/2023 1918   BUN 19.0 05/14/2017 1304   CREATININE 0.85 01/03/2023 1918   CREATININE 0.81 07/17/2022 1029   CREATININE 0.8 05/14/2017 1304      Component Value Date/Time   CALCIUM 8.9 01/03/2023 1918   CALCIUM 9.1 05/14/2017 1304   ALKPHOS 75 01/03/2023 1918   ALKPHOS 86 05/14/2017 1304   AST 31 01/03/2023 1918   AST 27 07/17/2022 1029   AST 25 05/14/2017 1304   ALT  19 01/03/2023 1918   ALT 16 07/17/2022 1029   ALT 15 05/14/2017 1304   BILITOT 0.8 01/03/2023 1918   BILITOT 0.6 07/17/2022 1029   BILITOT 0.57 05/14/2017 1304       RADIOGRAPHIC STUDIES: DG Chest Port 1 View  Result Date: 01/03/2023 CLINICAL DATA:  Weakness EXAM: PORTABLE CHEST 1 VIEW COMPARISON:  Chest x-ray 03/29/2015 FINDINGS: Heart is enlarged. The lungs are clear. No pleural effusion or pneumothorax. No acute fractures. IMPRESSION: Cardiomegaly. No acute pulmonary process. Electronically Signed   By: Darliss Cheney M.D.   On: 01/03/2023 20:56     ASSESSMENT AND PLAN:  This is a very pleasant 89 years old white female with recurrent non-small cell lung cancer, adenocarcinoma with positive EGFR mutation. The patient has been on treatment with Iressa 250 mg p.o. daily for the last 93 months. The patient has been tolerating her treatment with Iressa fairly well with no concerning adverse effects. I recommended for her to continue her current treatment with high risk of the same dose. I will see her back for follow-up visit in 3 months for evaluation with repeat lab and CT scan of the chest for restaging of her disease. She was advised  to call immediately if she has any other concerning symptoms in the interval. The patient voices understanding of current disease status and treatment options and is in agreement with the current care plan. All questions were answered. The patient knows to call the clinic with any problems, questions or concerns. We can certainly see the patient much sooner if necessary.  Disclaimer: This note was dictated with voice recognition software. Similar sounding words can inadvertently be transcribed and may not be corrected upon review.

## 2023-01-23 ENCOUNTER — Encounter: Payer: Self-pay | Admitting: Internal Medicine

## 2023-01-23 ENCOUNTER — Encounter (INDEPENDENT_AMBULATORY_CARE_PROVIDER_SITE_OTHER): Payer: Self-pay | Admitting: Gastroenterology

## 2023-01-23 NOTE — Progress Notes (Signed)
Rcvd staff msg from [facility] that pt would need to apply for the [employer] grant to help pay for her medication.  Since this would be her 2nd grant I sent to [employer] for approval and she approved her so I called the pt and got her household income verbally because she has no way of getting a hardcopy of her income to me because her next visit is in Dec so I approved her for the grant and let her know how to use it for her medication.  She verbalized understanding and was very Adult nurse.

## 2023-01-24 ENCOUNTER — Other Ambulatory Visit (HOSPITAL_COMMUNITY): Payer: Self-pay

## 2023-01-25 ENCOUNTER — Other Ambulatory Visit (HOSPITAL_COMMUNITY): Payer: Self-pay

## 2023-02-06 ENCOUNTER — Telehealth: Payer: Self-pay | Admitting: Medical Oncology

## 2023-02-06 DIAGNOSIS — Z681 Body mass index (BMI) 19 or less, adult: Secondary | ICD-10-CM | POA: Diagnosis not present

## 2023-02-06 DIAGNOSIS — R600 Localized edema: Secondary | ICD-10-CM | POA: Diagnosis not present

## 2023-02-06 NOTE — Telephone Encounter (Signed)
Lips swollen since Thursday. Burning ,painful to touch . Used Benadryl at home first -it did not reduce swelling.  Saw PCP today and she started Medrol dose pack and OTC lip balm. PCP did not know what caused the swelling.

## 2023-02-06 NOTE — Telephone Encounter (Signed)
Per Dr Arbutus Ped , I told pt to follow PCP instructions and to call PCP if symptoms worsen. She voiced understanding.

## 2023-02-08 ENCOUNTER — Ambulatory Visit: Payer: Medicare Other | Attending: Cardiology | Admitting: *Deleted

## 2023-02-08 DIAGNOSIS — I4891 Unspecified atrial fibrillation: Secondary | ICD-10-CM | POA: Diagnosis not present

## 2023-02-08 DIAGNOSIS — Z5181 Encounter for therapeutic drug level monitoring: Secondary | ICD-10-CM

## 2023-02-08 LAB — POCT INR: INR: 3.9 — AB (ref 2.0–3.0)

## 2023-02-08 NOTE — Patient Instructions (Signed)
On prednisone taper for lip swelling (6,5,4,3,2,1)  Took 4 tablets today. Hold warfarin tonight then resume 1 tablet daily  Eat extra greens while on prednisone  Recheck in 2 wks

## 2023-02-12 ENCOUNTER — Other Ambulatory Visit: Payer: Self-pay | Admitting: Cardiology

## 2023-02-12 DIAGNOSIS — Z681 Body mass index (BMI) 19 or less, adult: Secondary | ICD-10-CM | POA: Diagnosis not present

## 2023-02-12 DIAGNOSIS — L03114 Cellulitis of left upper limb: Secondary | ICD-10-CM | POA: Diagnosis not present

## 2023-02-12 DIAGNOSIS — K121 Other forms of stomatitis: Secondary | ICD-10-CM | POA: Diagnosis not present

## 2023-02-12 DIAGNOSIS — R600 Localized edema: Secondary | ICD-10-CM | POA: Diagnosis not present

## 2023-02-12 NOTE — Telephone Encounter (Signed)
Refill request for warfarin:  Last INR was 3.9 on 02/08/23 Next INR due 02/28/23 LOV was 11/17/22  Refill approved.

## 2023-02-14 ENCOUNTER — Telehealth: Payer: Self-pay | Admitting: *Deleted

## 2023-02-14 NOTE — Telephone Encounter (Signed)
Patient is calling to talk with Vashti Hey. Please call back

## 2023-02-14 NOTE — Telephone Encounter (Signed)
Called and spoke with patient.  Monday she was started on Clindamycin 150mg  3 x day x 5 days for cellulitis of elbow.  Last INR was 3.9.  Decrease warfarin to 2.5mg  x 3 days then resume 5mg  daily.  INR appt moved up to 02/21/23.  Pt verbalized agreement.

## 2023-02-21 ENCOUNTER — Other Ambulatory Visit (HOSPITAL_COMMUNITY): Payer: Self-pay

## 2023-02-21 ENCOUNTER — Other Ambulatory Visit: Payer: Self-pay

## 2023-02-21 ENCOUNTER — Ambulatory Visit: Payer: Medicare Other | Attending: Cardiology | Admitting: *Deleted

## 2023-02-21 DIAGNOSIS — Z5181 Encounter for therapeutic drug level monitoring: Secondary | ICD-10-CM | POA: Diagnosis not present

## 2023-02-21 DIAGNOSIS — I4891 Unspecified atrial fibrillation: Secondary | ICD-10-CM | POA: Diagnosis not present

## 2023-02-21 LAB — POCT INR: INR: 1.7 — AB (ref 2.0–3.0)

## 2023-02-21 NOTE — Patient Instructions (Signed)
Take warfarin 1 1/2 tablets tonight then resume 1 tablet daily  Be consistent with greens Recheck in 2 wks

## 2023-02-22 ENCOUNTER — Telehealth: Payer: Self-pay | Admitting: Medical Oncology

## 2023-02-22 DIAGNOSIS — C3490 Malignant neoplasm of unspecified part of unspecified bronchus or lung: Secondary | ICD-10-CM

## 2023-02-22 NOTE — Telephone Encounter (Signed)
Stomatitis-Mouth is still bothering her  It is very sore and her lips are still swollen. Lips crack and bleed.  She cannot wear her partial plates, eating soft /liquid foods.She itches all the time and has diarrhea all the time.  She  says she has been on generic gefitinib since it came available.  She wants to stop the Iressa for now to get her mouth healed. PCP prescribed MMW and vasoline on lips  Per Dr Arbutus Ped , I instructed pt to stop the gefitinib  x 7 days and continue oral care regimen and to call back if symptoms worsen.

## 2023-02-23 ENCOUNTER — Other Ambulatory Visit (HOSPITAL_COMMUNITY): Payer: Self-pay

## 2023-03-05 ENCOUNTER — Telehealth: Payer: Self-pay | Admitting: *Deleted

## 2023-03-05 ENCOUNTER — Other Ambulatory Visit (HOSPITAL_COMMUNITY): Payer: Self-pay

## 2023-03-05 ENCOUNTER — Other Ambulatory Visit: Payer: Self-pay | Admitting: Internal Medicine

## 2023-03-05 ENCOUNTER — Other Ambulatory Visit: Payer: Self-pay | Admitting: *Deleted

## 2023-03-05 DIAGNOSIS — C3490 Malignant neoplasm of unspecified part of unspecified bronchus or lung: Secondary | ICD-10-CM

## 2023-03-05 MED ORDER — IRESSA 250 MG PO TABS: 250.0000 mg | ORAL_TABLET | Freq: Every day | ORAL | 5 refills | Status: DC

## 2023-03-05 MED ORDER — IRESSA 250 MG PO TABS
250.0000 mg | ORAL_TABLET | Freq: Every day | ORAL | 5 refills | Status: DC
Start: 2023-03-05 — End: 2023-08-13
  Filled 2023-03-06: qty 30, 30d supply, fill #0
  Filled 2023-03-26: qty 30, 30d supply, fill #1
  Filled 2023-04-19: qty 30, 30d supply, fill #2
  Filled 2023-05-23: qty 30, 30d supply, fill #3
  Filled 2023-06-19: qty 30, 30d supply, fill #4
  Filled 2023-07-17: qty 30, 30d supply, fill #5

## 2023-03-05 NOTE — Telephone Encounter (Signed)
Prescription for Iressa (DAW) sent to [facility]. Pt notified that we are working to get her back on the non-generic, and will look at Oral Chemo pharmacist will look into patient assistance.

## 2023-03-05 NOTE — Telephone Encounter (Signed)
Natalie Carlson states her mouth much better, able to put in partial. Wants to know what know what to do about Iressa. She thinks the generic is the problem. Did not have any problem with Iressa, mouth problems only started when she took generic.

## 2023-03-06 ENCOUNTER — Other Ambulatory Visit: Payer: Self-pay

## 2023-03-06 NOTE — Progress Notes (Signed)
Specialty Pharmacy Refill Coordination Note  Natalie Carlson is a 89 y.o. female contacted today regarding refills of specialty medication(s) Gefitinib   Patient requested Delivery   Delivery date: 03/08/23   Verified address: [address]   [place] [address]   Medication will be filled on 03/07/23.

## 2023-03-06 NOTE — Progress Notes (Signed)
Specialty Pharmacy Ongoing Clinical Assessment Note  Natalie Carlson is a 89 y.o. female who is being followed by the specialty pharmacy service for RxSp Oncology   Patient's specialty medication(s) reviewed today: Gefitinib   Missed doses in the last 4 weeks: 0   Patient/Caregiver did not have any additional questions or concerns.   Therapeutic benefit summary: Patient is achieving benefit   Adverse events/side effects summary: No adverse events/side effects (Patient tolerated brand Iressa well but began experiencing stomatitis on generic. Provider switched back to brand.)   Patient's therapy is appropriate to: Continue    Goals Addressed             This Visit's Progress    Slow Disease Progression       Patient is on track. Patient will maintain adherence. Last CT from June 2024 showed stable disease.          Follow up:  6 months  Otto Herb Specialty Pharmacist

## 2023-03-07 ENCOUNTER — Ambulatory Visit: Payer: Medicare Other | Attending: Cardiology | Admitting: *Deleted

## 2023-03-07 ENCOUNTER — Encounter (INDEPENDENT_AMBULATORY_CARE_PROVIDER_SITE_OTHER): Payer: Self-pay | Admitting: Gastroenterology

## 2023-03-07 ENCOUNTER — Other Ambulatory Visit: Payer: Self-pay

## 2023-03-07 ENCOUNTER — Other Ambulatory Visit (HOSPITAL_COMMUNITY): Payer: Self-pay

## 2023-03-07 DIAGNOSIS — I4891 Unspecified atrial fibrillation: Secondary | ICD-10-CM

## 2023-03-07 DIAGNOSIS — Z5181 Encounter for therapeutic drug level monitoring: Secondary | ICD-10-CM

## 2023-03-07 LAB — POCT INR: INR: 1.4 — AB (ref 2.0–3.0)

## 2023-03-07 NOTE — Progress Notes (Signed)
Per Sandi we received generic today in order instead of brand. She has reordered brand for tomorrow. Left message for patient to inform of delay, will ship 10/24.

## 2023-03-07 NOTE — Patient Instructions (Signed)
Take warfarin 1 1/2 tablets x 3 days then resume 1 tablet daily  Has been off Iressa x 2 wks.  Starting back on brand name 03/08/23 Be consistent with greens Recheck in 1 wk

## 2023-03-08 ENCOUNTER — Other Ambulatory Visit: Payer: Self-pay

## 2023-03-15 ENCOUNTER — Ambulatory Visit: Payer: Medicare Other | Attending: Cardiology | Admitting: *Deleted

## 2023-03-15 DIAGNOSIS — Z5181 Encounter for therapeutic drug level monitoring: Secondary | ICD-10-CM

## 2023-03-15 DIAGNOSIS — I4891 Unspecified atrial fibrillation: Secondary | ICD-10-CM

## 2023-03-15 LAB — POCT INR: INR: 2.9 (ref 2.0–3.0)

## 2023-03-15 NOTE — Patient Instructions (Signed)
Continue warfarin 1 tablet daily  Back on brand name Iressa Be consistent with greens Recheck in 3 wk

## 2023-03-26 ENCOUNTER — Other Ambulatory Visit: Payer: Self-pay

## 2023-03-26 NOTE — Progress Notes (Signed)
Specialty Pharmacy Refill Coordination Note  Natalie Carlson is a 89 y.o. female contacted today regarding refills of specialty medication(s) Gefitinib   Patient requested Delivery   Delivery date: 04/03/23   Verified address: [address]   [place]  [address]   Medication will be filled on 04/02/23.

## 2023-04-04 ENCOUNTER — Other Ambulatory Visit: Payer: Self-pay

## 2023-04-05 ENCOUNTER — Ambulatory Visit: Payer: Medicare Other | Attending: Cardiology | Admitting: *Deleted

## 2023-04-05 DIAGNOSIS — Z5181 Encounter for therapeutic drug level monitoring: Secondary | ICD-10-CM | POA: Diagnosis not present

## 2023-04-05 DIAGNOSIS — I4891 Unspecified atrial fibrillation: Secondary | ICD-10-CM

## 2023-04-05 LAB — POCT INR: POC INR: 2.7

## 2023-04-05 NOTE — Patient Instructions (Signed)
Description   Continue warfarin 1 tablet daily  Back on brand name Iressa Be consistent with greens Recheck in 4 wk

## 2023-04-09 ENCOUNTER — Ambulatory Visit (INDEPENDENT_AMBULATORY_CARE_PROVIDER_SITE_OTHER): Payer: Medicare Other | Admitting: Gastroenterology

## 2023-04-12 ENCOUNTER — Other Ambulatory Visit: Payer: Self-pay | Admitting: Cardiology

## 2023-04-16 ENCOUNTER — Ambulatory Visit (INDEPENDENT_AMBULATORY_CARE_PROVIDER_SITE_OTHER): Payer: Medicare Other | Admitting: Gastroenterology

## 2023-04-17 ENCOUNTER — Other Ambulatory Visit: Payer: Self-pay

## 2023-04-17 ENCOUNTER — Other Ambulatory Visit: Payer: Medicare Other

## 2023-04-17 DIAGNOSIS — C3490 Malignant neoplasm of unspecified part of unspecified bronchus or lung: Secondary | ICD-10-CM

## 2023-04-18 ENCOUNTER — Inpatient Hospital Stay: Payer: Medicare Other | Attending: Physician Assistant

## 2023-04-18 ENCOUNTER — Ambulatory Visit (HOSPITAL_COMMUNITY)
Admission: RE | Admit: 2023-04-18 | Discharge: 2023-04-18 | Disposition: A | Discharge status: Home / Self Care | Payer: Medicare Other | Source: Ambulatory Visit | Attending: Internal Medicine | Admitting: Internal Medicine

## 2023-04-18 ENCOUNTER — Encounter (INDEPENDENT_AMBULATORY_CARE_PROVIDER_SITE_OTHER): Payer: Self-pay | Admitting: Gastroenterology

## 2023-04-18 ENCOUNTER — Other Ambulatory Visit: Payer: Medicare Other

## 2023-04-18 DIAGNOSIS — I7121 Aneurysm of the ascending aorta, without rupture: Secondary | ICD-10-CM | POA: Diagnosis not present

## 2023-04-18 DIAGNOSIS — Z888 Allergy status to other drugs, medicaments and biological substances status: Secondary | ICD-10-CM | POA: Insufficient documentation

## 2023-04-18 DIAGNOSIS — C3490 Malignant neoplasm of unspecified part of unspecified bronchus or lung: Secondary | ICD-10-CM

## 2023-04-18 DIAGNOSIS — Z8719 Personal history of other diseases of the digestive system: Secondary | ICD-10-CM | POA: Diagnosis not present

## 2023-04-18 DIAGNOSIS — K1379 Other lesions of oral mucosa: Secondary | ICD-10-CM | POA: Diagnosis not present

## 2023-04-18 DIAGNOSIS — M47816 Spondylosis without myelopathy or radiculopathy, lumbar region: Secondary | ICD-10-CM | POA: Insufficient documentation

## 2023-04-18 DIAGNOSIS — I4891 Unspecified atrial fibrillation: Secondary | ICD-10-CM | POA: Diagnosis not present

## 2023-04-18 DIAGNOSIS — Z9089 Acquired absence of other organs: Secondary | ICD-10-CM | POA: Insufficient documentation

## 2023-04-18 DIAGNOSIS — Z902 Acquired absence of lung [part of]: Secondary | ICD-10-CM | POA: Insufficient documentation

## 2023-04-18 DIAGNOSIS — N200 Calculus of kidney: Secondary | ICD-10-CM | POA: Diagnosis not present

## 2023-04-18 DIAGNOSIS — Z880 Allergy status to penicillin: Secondary | ICD-10-CM | POA: Diagnosis not present

## 2023-04-18 DIAGNOSIS — I2510 Atherosclerotic heart disease of native coronary artery without angina pectoris: Secondary | ICD-10-CM | POA: Insufficient documentation

## 2023-04-18 DIAGNOSIS — J439 Emphysema, unspecified: Secondary | ICD-10-CM | POA: Diagnosis not present

## 2023-04-18 DIAGNOSIS — Z79899 Other long term (current) drug therapy: Secondary | ICD-10-CM | POA: Insufficient documentation

## 2023-04-18 DIAGNOSIS — C3411 Malignant neoplasm of upper lobe, right bronchus or lung: Secondary | ICD-10-CM | POA: Insufficient documentation

## 2023-04-18 DIAGNOSIS — J432 Centrilobular emphysema: Secondary | ICD-10-CM | POA: Diagnosis not present

## 2023-04-18 DIAGNOSIS — I700 Atherosclerosis of aorta: Secondary | ICD-10-CM | POA: Diagnosis not present

## 2023-04-18 DIAGNOSIS — Z886 Allergy status to analgesic agent status: Secondary | ICD-10-CM | POA: Diagnosis not present

## 2023-04-18 DIAGNOSIS — Z885 Allergy status to narcotic agent status: Secondary | ICD-10-CM | POA: Diagnosis not present

## 2023-04-18 DIAGNOSIS — I517 Cardiomegaly: Secondary | ICD-10-CM | POA: Diagnosis not present

## 2023-04-18 DIAGNOSIS — Z881 Allergy status to other antibiotic agents status: Secondary | ICD-10-CM | POA: Diagnosis not present

## 2023-04-18 DIAGNOSIS — Z860100 Personal history of colon polyps, unspecified: Secondary | ICD-10-CM | POA: Diagnosis not present

## 2023-04-18 DIAGNOSIS — E041 Nontoxic single thyroid nodule: Secondary | ICD-10-CM | POA: Diagnosis not present

## 2023-04-18 DIAGNOSIS — Z7901 Long term (current) use of anticoagulants: Secondary | ICD-10-CM | POA: Diagnosis not present

## 2023-04-18 LAB — CBC WITH DIFFERENTIAL/PLATELET
Abs Immature Granulocytes: 0.01 10*3/uL (ref 0.00–0.07)
Basophils Absolute: 0 10*3/uL (ref 0.0–0.1)
Basophils Relative: 0 %
Eosinophils Absolute: 0.1 10*3/uL (ref 0.0–0.5)
Eosinophils Relative: 2 %
HCT: 44.8 % (ref 36.0–46.0)
Hemoglobin: 14.6 g/dL (ref 12.0–15.0)
Immature Granulocytes: 0 %
Lymphocytes Relative: 23 %
Lymphs Abs: 1.2 10*3/uL (ref 0.7–4.0)
MCH: 31 pg (ref 26.0–34.0)
MCHC: 32.6 g/dL (ref 30.0–36.0)
MCV: 95.1 fL (ref 80.0–100.0)
Monocytes Absolute: 0.8 10*3/uL (ref 0.1–1.0)
Monocytes Relative: 15 %
Neutro Abs: 3.1 10*3/uL (ref 1.7–7.7)
Neutrophils Relative %: 60 %
Platelets: 225 10*3/uL (ref 150–400)
RBC: 4.71 MIL/uL (ref 3.87–5.11)
RDW: 13.4 % (ref 11.5–15.5)
WBC: 5.2 10*3/uL (ref 4.0–10.5)
nRBC: 0 % (ref 0.0–0.2)

## 2023-04-18 LAB — COMPREHENSIVE METABOLIC PANEL
ALT: 19 U/L (ref 0–44)
AST: 29 U/L (ref 15–41)
Albumin: 4 g/dL (ref 3.5–5.0)
Alkaline Phosphatase: 75 U/L (ref 38–126)
Anion gap: 8 (ref 5–15)
BUN: 29 mg/dL — ABNORMAL HIGH (ref 8–23)
CO2: 28 mmol/L (ref 22–32)
Calcium: 9.5 mg/dL (ref 8.9–10.3)
Chloride: 103 mmol/L (ref 98–111)
Creatinine, Ser: 0.91 mg/dL (ref 0.44–1.00)
GFR, Estimated: 60 mL/min — ABNORMAL LOW (ref >60)
Glucose, Bld: 97 mg/dL (ref 70–99)
Potassium: 4.1 mmol/L (ref 3.5–5.1)
Sodium: 139 mmol/L (ref 135–145)
Total Bilirubin: 0.7 mg/dL (ref <1.2)
Total Protein: 7.3 g/dL (ref 6.5–8.1)

## 2023-04-18 MED ORDER — IOHEXOL 300 MG/ML  SOLN
75.0000 mL | Freq: Once | INTRAMUSCULAR | Status: AC | PRN
  Administered 2023-04-18: 100 mL via INTRAVENOUS

## 2023-04-19 ENCOUNTER — Other Ambulatory Visit (HOSPITAL_COMMUNITY): Payer: Self-pay

## 2023-04-19 ENCOUNTER — Other Ambulatory Visit (HOSPITAL_COMMUNITY): Payer: Self-pay | Admitting: Pharmacy Technician

## 2023-04-19 NOTE — Progress Notes (Signed)
Specialty Pharmacy Refill Coordination Note  Natalie Carlson is a 89 y.o. female contacted today regarding refills of specialty medication(s) Gefitinib   Patient requested Delivery   Delivery date: 05/03/23   Verified address: [address]  [place]   Medication will be filled on 05/02/23.

## 2023-04-23 ENCOUNTER — Inpatient Hospital Stay: Payer: Medicare Other | Admitting: Internal Medicine

## 2023-04-23 VITALS — BP 133/80 | HR 89 | Temp 97.8°F | Resp 16 | Ht 62.0 in | Wt 98.3 lb

## 2023-04-23 DIAGNOSIS — Z860100 Personal history of colon polyps, unspecified: Secondary | ICD-10-CM | POA: Diagnosis not present

## 2023-04-23 DIAGNOSIS — Z885 Allergy status to narcotic agent status: Secondary | ICD-10-CM | POA: Diagnosis not present

## 2023-04-23 DIAGNOSIS — M47816 Spondylosis without myelopathy or radiculopathy, lumbar region: Secondary | ICD-10-CM | POA: Diagnosis not present

## 2023-04-23 DIAGNOSIS — K1379 Other lesions of oral mucosa: Secondary | ICD-10-CM | POA: Diagnosis not present

## 2023-04-23 DIAGNOSIS — Z79899 Other long term (current) drug therapy: Secondary | ICD-10-CM | POA: Diagnosis not present

## 2023-04-23 DIAGNOSIS — I7121 Aneurysm of the ascending aorta, without rupture: Secondary | ICD-10-CM | POA: Diagnosis not present

## 2023-04-23 DIAGNOSIS — C3411 Malignant neoplasm of upper lobe, right bronchus or lung: Secondary | ICD-10-CM | POA: Diagnosis not present

## 2023-04-23 DIAGNOSIS — Z881 Allergy status to other antibiotic agents status: Secondary | ICD-10-CM | POA: Diagnosis not present

## 2023-04-23 DIAGNOSIS — Z902 Acquired absence of lung [part of]: Secondary | ICD-10-CM | POA: Diagnosis not present

## 2023-04-23 DIAGNOSIS — Z886 Allergy status to analgesic agent status: Secondary | ICD-10-CM | POA: Diagnosis not present

## 2023-04-23 DIAGNOSIS — Z880 Allergy status to penicillin: Secondary | ICD-10-CM | POA: Diagnosis not present

## 2023-04-23 DIAGNOSIS — I517 Cardiomegaly: Secondary | ICD-10-CM | POA: Diagnosis not present

## 2023-04-23 DIAGNOSIS — N200 Calculus of kidney: Secondary | ICD-10-CM | POA: Diagnosis not present

## 2023-04-23 DIAGNOSIS — Z9089 Acquired absence of other organs: Secondary | ICD-10-CM | POA: Diagnosis not present

## 2023-04-23 DIAGNOSIS — J432 Centrilobular emphysema: Secondary | ICD-10-CM | POA: Diagnosis not present

## 2023-04-23 DIAGNOSIS — Z7901 Long term (current) use of anticoagulants: Secondary | ICD-10-CM | POA: Diagnosis not present

## 2023-04-23 DIAGNOSIS — Z888 Allergy status to other drugs, medicaments and biological substances status: Secondary | ICD-10-CM | POA: Diagnosis not present

## 2023-04-23 DIAGNOSIS — I4891 Unspecified atrial fibrillation: Secondary | ICD-10-CM | POA: Diagnosis not present

## 2023-04-23 DIAGNOSIS — E041 Nontoxic single thyroid nodule: Secondary | ICD-10-CM | POA: Diagnosis not present

## 2023-04-23 DIAGNOSIS — Z8719 Personal history of other diseases of the digestive system: Secondary | ICD-10-CM | POA: Diagnosis not present

## 2023-04-23 DIAGNOSIS — I700 Atherosclerosis of aorta: Secondary | ICD-10-CM | POA: Diagnosis not present

## 2023-04-23 DIAGNOSIS — I2510 Atherosclerotic heart disease of native coronary artery without angina pectoris: Secondary | ICD-10-CM | POA: Diagnosis not present

## 2023-04-23 NOTE — Progress Notes (Signed)
[facility] [phone]   [phone]  OFFICE PROGRESS NOTE  Natalie Chimera, MD [address] [place] [address]  DIAGNOSIS: Recurrent non-small cell lung cancer presented with bilateral lung nodules in October 2016 initially diagnosed as a stage IA adenocarcinoma with positive EGFR mutation with deletion in exon 69 in December 2012.  PRIOR THERAPY: Status post bronchoscopy with right VATS and right upper lobectomy with lymph node dissection under the care of Dr. Tyrone Sage on 05/31/2011.  CURRENT THERAPY: Iressa 250 mg by mouth daily started 04/17/2015, status post 93 months of treatment.  INTERVAL HISTORY: Natalie Carlson 89 y.o. female returns to the clinic today for follow-up visit accompanied by her friend.Discussed the use of AI scribe software for clinical note transcription with the patient, who gave verbal consent to proceed.  History of Present Illness   The patient, a 89 year old with a history of adenocarcinoma, has been on treatment with Iressa (Gefitinib) 250mg  for the past 96 months due to an EGFR mutation with deletion in exon 19. She reports a recent issue with mouth sores that developed after switching from the brand name Iressa to a generic version (Gefitinib). The mouth sores were severe and distressing, leading to the patient discontinuing the generic medication and resuming the brand name Iressa. The mouth sores have since improved significantly, although the patient continues to use a prescribed mouthwash once daily for maintenance.  The patient also reports an unusual reaction to the contrast dye used during her most recent CT scan. She experienced pain upon injection of the dye and felt unwell for the remainder of the day. Despite this, the patient's scan results were favorable, showing no growth or spread of her cancer.  In addition to her cancer treatment, the patient is also dealing with deteriorating eyesight and has an upcoming  appointment with an eye doctor. She also mentions a lack of strength in her hands, which has affected her ability to open jars. Despite these issues, the patient is still able to engage in activities such as yard work.        MEDICAL HISTORY: Past Medical History:  Diagnosis Date   Arthritis    Atrial fibrillation (HCC)    Cataract of both eyes    Chronic back pain    Chronic diarrhea    Colitis    Hemorrhoids    History of colon polyps    History of migraines    Hyperlipidemia    Hypothyroidism    Macular degeneration, dry    Malignant neoplasm of right upper lobe of lung (HCC)    Stage 1A  EGFR positive  Deletion in exon 19.  ALK-negative  Right Upper Lobe Lung Mass  SUV 2.9 on PET Status post VATS and right upper lobe lobectomy 05/31/2011   Mucus in stool    Nephrolithiasis    1994 - passed on its on    ALLERGIES:  is allergic to doxycycline, entocort ec [budesonide], neurontin [gabapentin], vicodin [hydrocodone-acetaminophen], bactrim [sulfamethoxazole-trimethoprim], cephalexin, adhesive [tape], amoxicillin, chocolate, codeine, levaquin [levofloxacin in d5w], morphine and codeine, naprosyn [naproxen], penicillins, prednisone, tramadol, and uncoded nonscreenable allergen.  MEDICATIONS:  Current Outpatient Medications  Medication Sig Dispense Refill   acetaminophen (TYLENOL) 500 MG tablet Take 500 mg by mouth. Patient takes as needed , which is seldom.     Biotin 10 MG TABS Take 1 tablet by mouth daily.     Calcium Carbonate (CALCIUM 600 PO) Take 1 tablet by mouth  2 (two) times daily.     Cholecalciferol (D3 5000) 125 MCG (5000 UT) capsule Take 5,000 Units by mouth daily.     diltiazem (CARDIZEM CD) 180 MG 24 hr capsule TAKE ONE CAPSULE BY MOUTH EVERY DAY 90 capsule 2   Diphenhyd-Hydrocort-Nystatin (FIRST-DUKES MOUTHWASH MT) Take 10 mLs by mouth every 6 (six) hours as needed (oral inflammation).     furosemide (LASIX) 20 MG tablet TAKE 1 TABLET BY MOUTH EVERY DAY AS NEEDED FOR  FLUID 30 tablet 1   IRESSA 250 MG tablet Take 1 tablet (250 mg total) by mouth daily. 30 tablet 5   loperamide (IMODIUM A-D) 2 MG tablet Take 1 mg by mouth daily.     mesalamine (APRISO) 0.375 g 24 hr capsule TAKE TWO CAPSULES BY MOUTH TWICE DAILY 360 capsule 2   Multiple Vitamins-Minerals (AIRBORNE) CHEW Chew 1 tablet by mouth daily. (Patient not taking: Reported on 01/03/2023)     Multiple Vitamins-Minerals (PRESERVISION AREDS 2 PO) Take 1 tablet by mouth 2 (two) times daily with a meal.     NON FORMULARY Eye In jection every 14 weeks. Both eyes are injected. Last injection was 02/23/2020.     Omega-3 Fatty Acids (FISH OIL) 1200 MG CAPS Take 1,200 mg by mouth daily.     OVER THE COUNTER MEDICATION Place 1 drop into both eyes See admin instructions. Soothe Eye drops uses 6-8 times per day.     potassium chloride (KLOR-CON M) 10 MEQ tablet TAKE 1 TABLET BY MOUTH EVERY DAY 30 tablet 1   warfarin (COUMADIN) 5 MG tablet TAKE 1 TABLET BY MOUTH DAILY OR AS DIRECTED by coumadin clinic 40 tablet 6   No current facility-administered medications for this visit.    SURGICAL HISTORY:  Past Surgical History:  Procedure Laterality Date   Bladder tack  1996   CARDIOVERSION  11/02/09   x 2   CATARACT EXTRACTION  2010/2011   COLONOSCOPY  3 YRS AGO   COLONOSCOPY  03/06/2011   Procedure: COLONOSCOPY;  Surgeon: Malissa Hippo, MD;  Location: [facility];  Service: Endoscopy;  Laterality: N/A;  7:30 am   ESOPHAGOGASTRODUODENOSCOPY     FLEXIBLE SIGMOIDOSCOPY N/A 10/04/2012   Procedure: FLEXIBLE SIGMOIDOSCOPY;  Surgeon: Malissa Hippo, MD;  Location: [facility];  Service: Endoscopy;  Laterality: N/A;  11:00-moved to 1015 Ann to notify pt   THYROID SURGERY  1961   TONSILLECTOMY     VIDEO BRONCHOSCOPY  05/31/2011   Procedure: VIDEO BRONCHOSCOPY;  Surgeon: Delight Ovens, MD;  Location: [facility] OR;  Service: Thoracic;  Laterality: N/A;    REVIEW OF SYSTEMS:  Constitutional: positive for fatigue Eyes:  negative Ears, nose, mouth, throat, and face: positive for sore mouth Respiratory: negative Cardiovascular: negative Gastrointestinal: negative Genitourinary:negative Integument/breast: negative Hematologic/lymphatic: negative Musculoskeletal:negative Neurological: negative Behavioral/Psych: negative Endocrine: negative Allergic/Immunologic: negative   PHYSICAL EXAMINATION: General appearance: alert, cooperative, and no distress Head: Normocephalic, without obvious abnormality, atraumatic Neck: no adenopathy, no JVD, supple, symmetrical, trachea midline, and thyroid not enlarged, symmetric, no tenderness/mass/nodules Lymph nodes: Cervical, supraclavicular, and axillary nodes normal. Resp: clear to auscultation bilaterally Back: symmetric, no curvature. ROM normal. No CVA tenderness. Cardio: regular rate and rhythm, S1, S2 normal, no murmur, click, rub or gallop GI: soft, non-tender; bowel sounds normal; no masses,  no organomegaly Extremities: extremities normal, atraumatic, no cyanosis or edema Neurologic: Alert and oriented X 3, normal strength and tone. Normal symmetric reflexes. Normal coordination and gait  ECOG PERFORMANCE STATUS: 0 - Asymptomatic  Blood pressure 133/80, pulse 89, temperature 97.8 F (36.6 C), temperature source Temporal, resp. rate 16, height 5\' 2"  (1.575 m), weight 98 lb 4.8 oz (44.6 kg), SpO2 100%.  LABORATORY DATA: Lab Results  Component Value Date   WBC 5.2 04/18/2023   HGB 14.6 04/18/2023   HCT 44.8 04/18/2023   MCV 95.1 04/18/2023   PLT 225 04/18/2023      Chemistry      Component Value Date/Time   NA 139 04/18/2023 1527   NA 141 05/14/2017 1304   K 4.1 04/18/2023 1527   K 4.3 05/14/2017 1304   CL 103 04/18/2023 1527   CO2 28 04/18/2023 1527   CO2 30 (H) 05/14/2017 1304   BUN 29 (H) 04/18/2023 1527   BUN 19.0 05/14/2017 1304   CREATININE 0.91 04/18/2023 1527   CREATININE 0.83 01/22/2023 1030   CREATININE 0.8 05/14/2017 1304       Component Value Date/Time   CALCIUM 9.5 04/18/2023 1527   CALCIUM 9.1 05/14/2017 1304   ALKPHOS 75 04/18/2023 1527   ALKPHOS 86 05/14/2017 1304   AST 29 04/18/2023 1527   AST 25 01/22/2023 1030   AST 25 05/14/2017 1304   ALT 19 04/18/2023 1527   ALT 14 01/22/2023 1030   ALT 15 05/14/2017 1304   BILITOT 0.7 04/18/2023 1527   BILITOT 0.7 01/22/2023 1030   BILITOT 0.57 05/14/2017 1304       RADIOGRAPHIC STUDIES: CT Chest W Contrast  Result Date: 04/20/2023 CLINICAL DATA:  Follow-up non-small cell lung cancer EXAM: CT CHEST WITH CONTRAST TECHNIQUE: Multidetector CT imaging of the chest was performed during intravenous contrast administration. RADIATION DOSE REDUCTION: This exam was performed according to the departmental dose-optimization program which includes automated exposure control, adjustment of the mA and/or kV according to patient size and/or use of iterative reconstruction technique. CONTRAST:  OMNIPAQUE IOHEXOL 300 MG/ML  SOLN COMPARISON:  Chest radiograph dated 01/03/2023. CT chest dated 10/18/2022. FINDINGS: Cardiovascular: Cardiomegaly with enlargement of the right heart. No pericardial effusion. 4.0 cm ascending thoracic aortic aneurysm. Atherosclerotic calcifications of the aortic arch. Mild coronary atherosclerosis of the LAD and right coronary artery. Mediastinum/Nodes: No suspicious mediastinal lymphadenopathy. Dominant 14 mm right thyroid nodule (series 2/image 27). Not clinically significant; no follow-up imaging recommended (ref: J Am Coll Radiol. 2015 Feb;12(2): 143-50). Lungs/Pleura: Status post right upper lobectomy. Subpleural reticulation/fibrosis in the lungs bilaterally, suggesting mild chronic interstitial lung disease. Mild centrilobular emphysematous changes, upper lung predominant. Stable 6 mm nodule in the left lung apex (series 3/image 16), benign. Additional scattered small bilateral pulmonary nodules, unchanged. No new/suspicious pulmonary nodules. No  focal consolidation. No pleural effusion or pneumothorax. Upper Abdomen: Visualized upper abdomen is grossly unremarkable, noting vascular calcifications and a 4 mm nonobstructing interpolar left renal calculus. Musculoskeletal: Mild degenerative changes of the lower thoracic/upper lumbar spine. IMPRESSION: Status post right upper lobectomy. No evidence of recurrent or metastatic disease. 4.0 cm ascending thoracic aortic aneurysm. Given the patient's age, consider attention on follow-up as clinically warranted. Aortic Atherosclerosis (ICD10-I70.0) and Emphysema (ICD10-J43.9). Electronically Signed   By: Charline Bills M.D.   On: 04/20/2023 22:47     ASSESSMENT AND PLAN:  This is a very pleasant 89 years old white female with recurrent non-small cell lung cancer, adenocarcinoma with positive EGFR mutation. The patient has been on treatment with Iressa 250 mg p.o. daily for the last 96  months. The patient has been tolerating her treatment with the brand-name Iressa fairly well but when switched to generic  afatinib she has significant issues with mouth sores before switching back to the brand name.  She had repeat CT scan of the chest performed recently.  I personally and independently reviewed the scan and discussed the result with the patient today.  Her scan showed no concerning findings for disease progression.    Adenocarcinoma of the lung with EGFR mutation Adenocarcinoma of the lung with EGFR mutation, diagnosed December 2012. On Iressa (gefitinib) 250 mg since October 2016. Mouth sores improved after reverting to brand name. Recent CT chest shows no disease progression. Adverse reactions to contrast dye include stomach discomfort and malaise. Discussed future CT scans without contrast. - Continue Iressa (gefitinib) 250 mg - Use [employer] Magic Mouthwash once daily for mouth sores - Consider future CT scans without contrast - Schedule follow-up in three months with blood work only  Vision  problems Ongoing eyesight issues with an eye doctor appointment scheduled for tomorrow. - Follow up with eye doctor appointment  General Health Maintenance Able to perform daily activities but has difficulty with tasks requiring hand strength. Maintaining a balanced diet with occasional fast food. - Encourage continued physical activity as tolerated - Maintain a balanced diet  Follow-up - Ensure next appointment is scheduled before leaving the office - Convey concerns about the scheduling process to the administration.   The patient was advised to call immediately if she has any concerning symptoms in the interval. The patient voices understanding of current disease status and treatment options and is in agreement with the current care plan. All questions were answered. The patient knows to call the clinic with any problems, questions or concerns. We can certainly see the patient much sooner if necessary.  Disclaimer: This note was dictated with voice recognition software. Similar sounding words can inadvertently be transcribed and may not be corrected upon review.

## 2023-04-24 ENCOUNTER — Ambulatory Visit: Payer: Medicare Other | Admitting: Internal Medicine

## 2023-04-24 DIAGNOSIS — Q103 Other congenital malformations of eyelid: Secondary | ICD-10-CM | POA: Diagnosis not present

## 2023-04-24 DIAGNOSIS — H353211 Exudative age-related macular degeneration, right eye, with active choroidal neovascularization: Secondary | ICD-10-CM | POA: Diagnosis not present

## 2023-04-24 DIAGNOSIS — H35033 Hypertensive retinopathy, bilateral: Secondary | ICD-10-CM | POA: Diagnosis not present

## 2023-04-24 DIAGNOSIS — H04123 Dry eye syndrome of bilateral lacrimal glands: Secondary | ICD-10-CM | POA: Diagnosis not present

## 2023-04-24 DIAGNOSIS — H3540 Unspecified peripheral retinal degeneration: Secondary | ICD-10-CM | POA: Diagnosis not present

## 2023-04-24 DIAGNOSIS — H35371 Puckering of macula, right eye: Secondary | ICD-10-CM | POA: Diagnosis not present

## 2023-04-24 DIAGNOSIS — H353223 Exudative age-related macular degeneration, left eye, with inactive scar: Secondary | ICD-10-CM | POA: Diagnosis not present

## 2023-04-24 DIAGNOSIS — H43813 Vitreous degeneration, bilateral: Secondary | ICD-10-CM | POA: Diagnosis not present

## 2023-05-02 ENCOUNTER — Other Ambulatory Visit: Payer: Self-pay

## 2023-05-03 ENCOUNTER — Ambulatory Visit: Payer: Medicare Other | Attending: Cardiology | Admitting: *Deleted

## 2023-05-03 DIAGNOSIS — I4891 Unspecified atrial fibrillation: Secondary | ICD-10-CM | POA: Diagnosis not present

## 2023-05-03 DIAGNOSIS — Z5181 Encounter for therapeutic drug level monitoring: Secondary | ICD-10-CM

## 2023-05-03 LAB — POCT INR: INR: 3.1 — AB (ref 2.0–3.0)

## 2023-05-03 NOTE — Patient Instructions (Signed)
Take 1/2 tablet tonight then resume 1 tablet daily  Back on brand name Iressa Be consistent with greens Recheck in 4 wk

## 2023-05-15 ENCOUNTER — Encounter (INDEPENDENT_AMBULATORY_CARE_PROVIDER_SITE_OTHER): Payer: Self-pay

## 2023-05-15 ENCOUNTER — Ambulatory Visit (INDEPENDENT_AMBULATORY_CARE_PROVIDER_SITE_OTHER): Payer: Medicare Other | Admitting: Gastroenterology

## 2023-05-17 ENCOUNTER — Other Ambulatory Visit (HOSPITAL_COMMUNITY): Payer: Self-pay

## 2023-05-17 ENCOUNTER — Telehealth: Payer: Self-pay | Admitting: Pharmacy Technician

## 2023-05-17 NOTE — Telephone Encounter (Signed)
 Oral Oncology Patient Advocate Encounter  Was successful in securing patient a grant from [employer] to provide copayment coverage for Iressa .  This will keep the out of pocket expense at $0.    I have spoken with the patient.   The billing information is as follows and has been shared with [facility].    RxBin: [id number] PCN: [id number] Member ID: [id number] Group ID: [id number] Dates of Eligibility: 05/17/23 through 06/16/23  Fund:  NSCLC  Estefana Moellers, CPhT-Adv Oncology Pharmacy Patient Advocate [facility] Direct Number: [phone]  Fax: [phone]

## 2023-05-23 ENCOUNTER — Other Ambulatory Visit: Payer: Self-pay

## 2023-05-23 NOTE — Progress Notes (Signed)
 Specialty Pharmacy Refill Coordination Note  Natalie Carlson is a 89 y.o. female contacted today regarding refills of specialty medication(s) Gefitinib  (Iressa )   Patient requested Delivery   Delivery date: 05/29/23   Verified address: [address]   [place] [address]   Medication will be filled on 05/28/23.

## 2023-05-28 ENCOUNTER — Other Ambulatory Visit: Payer: Self-pay

## 2023-05-28 ENCOUNTER — Other Ambulatory Visit (HOSPITAL_COMMUNITY): Payer: Self-pay

## 2023-05-28 NOTE — Progress Notes (Signed)
 05/28/23: Iressa - CMA  Left patient a voicemail regarding the delay in shipment. Shipping 05/29/23.

## 2023-05-31 ENCOUNTER — Ambulatory Visit: Payer: Medicare Other | Attending: Cardiology | Admitting: *Deleted

## 2023-05-31 DIAGNOSIS — I4891 Unspecified atrial fibrillation: Secondary | ICD-10-CM | POA: Diagnosis not present

## 2023-05-31 DIAGNOSIS — Z5181 Encounter for therapeutic drug level monitoring: Secondary | ICD-10-CM

## 2023-05-31 LAB — POCT INR: INR: 1.5 — AB (ref 2.0–3.0)

## 2023-05-31 NOTE — Patient Instructions (Signed)
Take 1 1/2 tablets tonight and tomorrow night then resume 1 tablet daily  Back on brand name Iressa Be consistent with greens Recheck in 2 wks

## 2023-06-05 ENCOUNTER — Ambulatory Visit (INDEPENDENT_AMBULATORY_CARE_PROVIDER_SITE_OTHER): Payer: Medicare Other | Admitting: Gastroenterology

## 2023-06-05 ENCOUNTER — Encounter (INDEPENDENT_AMBULATORY_CARE_PROVIDER_SITE_OTHER): Payer: Self-pay | Admitting: Gastroenterology

## 2023-06-05 VITALS — BP 151/77 | HR 87 | Temp 97.5°F | Ht 62.0 in | Wt 97.0 lb

## 2023-06-05 DIAGNOSIS — K5010 Crohn's disease of large intestine without complications: Secondary | ICD-10-CM

## 2023-06-05 MED ORDER — MESALAMINE ER 0.375 G PO CP24: ORAL_CAPSULE | ORAL | 2 refills | Status: DC

## 2023-06-05 NOTE — Patient Instructions (Addendum)
-  For now continue your mesalamine 0.375g two capsules twice daily, we will reach out to your pharmacy to see if they can tell which medications similar to this are covered -as discussed, I would like to check inflammatory markers in regards to your crohn's disease   Follow up 1 year  It was a pleasure to see you today. I want to create trusting relationships with patients and provide genuine, compassionate, and quality care. I truly value your feedback! please be on the lookout for a survey regarding your visit with me today. I appreciate your input about our visit and your time in completing this!    Kendel Bessey L. Jeanmarie Hubert, MSN, APRN, AGNP-C Adult-Gerontology Nurse Practitioner [facility] at [facility]

## 2023-06-05 NOTE — Progress Notes (Addendum)
Referring Provider: [name], MD Primary Care Physician:  [name], MD Primary GI Physician: Dr. Levon Hedger   Chief Complaint  Patient presents with   Follow-up    Patient here today for a follow up on her Crohn's.Patient says she is doing well on Mesalamine 0.375 mg Two capsules bid. She says this works for her but she says the insurance is no longer wanting to pay for this. She has brought the letter with her today that suggests alternatives.   HPI:   Natalie Carlson is a 89 y.o. female with past medical history of arthritis, afib, crohn's disease, HLD, hypothyroidism, macular degeneration, stage IV lung cancer.   Patient presenting today for follow up of Crohn's colitis  Last seen November 2023, at that time patient was doing well, reported she had C. difficile back in October.  C. difficile came secondary to antibiotics she had to take for staph infection of the scalp.  Completed course of vancomycin and having 1-2 bowel movements per day that were solid.  I previously tried to stop her Apriso about a year ago but had to restart it as she was having diarrhea and abdominal cramping.  Recommended check fecal calprotectin and CRP, continue Apriso 1.5 g daily, continue probiotic  Crp and fecal calprotectin were not completed   CMP and CBC in December 2024 were WNL   Present: Doing well. Has 1 formed stool per day, usually right after breakfast. No abdominal pain, rectal bleeding or melena. Appetite is good. Eating 3 meals per day and 2 snacks. She got a form from her insurance saying they will not cover mesalamine anymore (listed covered meds: apriso tier 3, mesalamine ER tier 4, pentasa tier 4).  She is continued on Iressa for her lung cancer since 2016   No red flag symptoms. Patient denies melena, hematochezia, nausea, vomiting, diarrhea, constipation, dysphagia, odyonophagia, early satiety or weight loss.   Extraintestinal Manifestations: Skin: no skin lesions   Joints: no issues other than arthritis in her hands  Eyes: hx of AMD, sees eye doctor regularly   Flu Shot:up to date for 2024 Covid shot: 2023  Pna: previously completed series  RSV: given in 2023   Last colonoscopy: 2012 Normal terminal ileum. Ulceration involving ileocecal valve and rectum with 2 erosions at transverse and 1 at sigmoid colon. Op she taken from ileocecal valve and rectal ulcer. Ileocecal ulcer may explain her right-sided pain and rectal ulcer explain her rectal discharge and bleeding. Flex sig in 2014 with Multiple erosions involving mucosa of sigmoid colon and rectum    Past Medical History:  Diagnosis Date   Arthritis    Atrial fibrillation (HCC)    Cataract of both eyes    Chronic back pain    Chronic diarrhea    Colitis    Hemorrhoids    History of colon polyps    History of migraines    Hyperlipidemia    Hypothyroidism    Macular degeneration, dry    Malignant neoplasm of right upper lobe of lung (HCC)    Stage 1A  EGFR positive  Deletion in exon 19.  ALK-negative  Right Upper Lobe Lung Mass  SUV 2.9 on PET Status post VATS and right upper lobe lobectomy 05/31/2011   Mucus in stool    Nephrolithiasis    1994 - passed on its on    Past Surgical History:  Procedure Laterality Date   Bladder tack  1996   CARDIOVERSION  11/02/09  x 2   CATARACT EXTRACTION  2010/2011   COLONOSCOPY  3 YRS AGO   COLONOSCOPY  03/06/2011   Procedure: COLONOSCOPY;  Surgeon: Malissa Hippo, MD;  [facility];  Service: Endoscopy;  Laterality: N/A;  7:30 am   ESOPHAGOGASTRODUODENOSCOPY     FLEXIBLE SIGMOIDOSCOPY N/A 10/04/2012   Procedure: FLEXIBLE SIGMOIDOSCOPY;  Surgeon: Malissa Hippo, MD;  [facility];  Service: Endoscopy;  Laterality: N/A;  11:00-moved to 1015 Ann to notify pt   THYROID SURGERY  1961   TONSILLECTOMY     VIDEO BRONCHOSCOPY  05/31/2011   Procedure: VIDEO BRONCHOSCOPY;  Surgeon: Delight Ovens, MD;  Location: [facility] OR;  Service:  Thoracic;  Laterality: N/A;    Current Outpatient Medications  Medication Sig Dispense Refill   acetaminophen (TYLENOL) 500 MG tablet Take 500 mg by mouth. Patient takes as needed , which is seldom.     Biotin 10 MG TABS Take 1 tablet by mouth daily.     Calcium Carbonate (CALCIUM 600 PO) Take 1 tablet by mouth 2 (two) times daily.     Cholecalciferol (D3 5000) 125 MCG (5000 UT) capsule Take 5,000 Units by mouth daily.     diltiazem (CARDIZEM CD) 180 MG 24 hr capsule TAKE ONE CAPSULE BY MOUTH EVERY DAY 90 capsule 2   Diphenhyd-Hydrocort-Nystatin (FIRST-DUKES MOUTHWASH MT) Take 10 mLs by mouth every 6 (six) hours as needed (oral inflammation).     furosemide (LASIX) 20 MG tablet TAKE 1 TABLET BY MOUTH EVERY DAY AS NEEDED FOR FLUID 30 tablet 1   IRESSA 250 MG tablet Take 1 tablet (250 mg total) by mouth daily. 30 tablet 5   loperamide (IMODIUM A-D) 2 MG tablet Take 1 mg by mouth daily.     mesalamine (APRISO) 0.375 g 24 hr capsule TAKE TWO CAPSULES BY MOUTH TWICE DAILY 360 capsule 2   Multiple Vitamins-Minerals (AIRBORNE) CHEW Chew 1 tablet by mouth daily.     Multiple Vitamins-Minerals (PRESERVISION AREDS 2 PO) Take 1 tablet by mouth 2 (two) times daily with a meal.     NON FORMULARY Eye In jection every 14 weeks. Both eyes are injected. Last injection was 02/23/2020.     Omega-3 Fatty Acids (FISH OIL) 1200 MG CAPS Take 1,200 mg by mouth daily.     OVER THE COUNTER MEDICATION Place 1 drop into both eyes See admin instructions. Soothe Eye drops uses 6-8 times per day.     potassium chloride (KLOR-CON M) 10 MEQ tablet TAKE 1 TABLET BY MOUTH EVERY DAY 30 tablet 1   warfarin (COUMADIN) 5 MG tablet TAKE 1 TABLET BY MOUTH DAILY OR AS DIRECTED by coumadin clinic 40 tablet 6   No current facility-administered medications for this visit.    Allergies as of 06/05/2023 - Review Complete 06/05/2023  Allergen Reaction Noted   Doxycycline Swelling and Other (See Comments) 05/18/2011   Entocort ec  [budesonide] Other (See Comments) 04/11/2011   Neurontin [gabapentin] Swelling and Other (See Comments) 09/05/2012   Vicodin [hydrocodone-acetaminophen] Rash and Other (See Comments) 09/05/2012   Bactrim [sulfamethoxazole-trimethoprim] Nausea And Vomiting 04/17/2022   Cephalexin Diarrhea 04/17/2022   Adhesive [tape] Other (See Comments) 05/29/2011   Amoxicillin Itching and Rash 05/04/2014   Chocolate Other (See Comments) 02/20/2011   Codeine Nausea Only and Other (See Comments)    Levaquin [levofloxacin in d5w] Itching and Rash 05/04/2014   Morphine and codeine Nausea Only 05/18/2011   Naprosyn [naproxen] Other (See Comments) 10/03/2011   Penicillins Other (  See Comments) 02/20/2011   Prednisone Itching 03/20/2012   Tramadol Itching 10/03/2011   Uncoded nonscreenable allergen Other (See Comments) 05/31/2011    Family History  Problem Relation Age of Onset   Stroke Mother    Stroke Father    Anesthesia problems Sister    Stroke Other        two siblings   Heart disease Other        sibling   Liver disease Paternal Grandmother    Hypotension Neg Hx    Malignant hyperthermia Neg Hx    Pseudochol deficiency Neg Hx     Social History   Socioeconomic History   Marital status: Widowed    Spouse name: Not on file   Number of children: Not on file   Years of education: Not on file   Highest education level: Not on file  Occupational History   Not on file  Tobacco Use   Smoking status: Never   Smokeless tobacco: Never  Vaping Use   Vaping status: Never Used  Substance and Sexual Activity   Alcohol use: No    Alcohol/week: 0.0 standard drinks of alcohol   Drug use: No   Sexual activity: Never  Other Topics Concern   Not on file  Social History Narrative   Widow   Retired from Designer, fashion/clothing            Social Drivers of Corporate investment banker Strain: Not on file  Food Insecurity: Not on file  Transportation Needs: Not on file  Physical Activity: Not on file   Stress: Not on file  Social Connections: Not on file    Review of systems General: negative for malaise, night sweats, fever, chills, weight loss Neck: Negative for lumps, goiter, pain and significant neck swelling Resp: Negative for cough, wheezing, dyspnea at rest CV: Negative for chest pain, leg swelling, palpitations, orthopnea GI: denies melena, hematochezia, nausea, vomiting, diarrhea, constipation, dysphagia, odyonophagia, early satiety or unintentional weight loss.  MSK: Negative for joint pain or swelling, back pain, and muscle pain. Derm: Negative for itching or rash Psych: Denies depression, anxiety, memory loss, confusion. No homicidal or suicidal ideation.  Heme: Negative for prolonged bleeding, bruising easily, and swollen nodes. Endocrine: Negative for cold or heat intolerance, polyuria, polydipsia and goiter. Neuro: negative for tremor, gait imbalance, syncope and seizures. The remainder of the review of systems is noncontributory.  Physical Exam: BP (!) 151/77 (BP Location: Left Arm, Patient Position: Sitting)   Pulse 87   Temp (!) 97.5 F (36.4 C) (Temporal)   Ht 5\' 2"  (1.575 m)   Wt 97 lb (44 kg)   BMI 17.74 kg/m  General:   Alert and oriented. No distress noted. Pleasant and cooperative.  Head:  Normocephalic and atraumatic. Eyes:  Conjuctiva clear without scleral icterus. Mouth:  Oral mucosa pink and moist. Good dentition. No lesions. Heart: Normal rate and rhythm, s1 and s2 heart sounds present.  Lungs: Clear lung sounds in all lobes. Respirations equal and unlabored. Abdomen:  +BS, soft, non-tender and non-distended. No rebound or guarding. No HSM or masses noted. Derm: No palmar erythema or jaundice Msk:  Symmetrical without gross deformities. Normal posture. Extremities:  Without edema. Neurologic:  Alert and  oriented x4 Psych:  Alert and cooperative. Normal mood and affect.  Invalid input(s): "6 MONTHS"   ASSESSMENT: BRESLIN SOUNG is a 89 y.o.  female presenting today for follow up of Crohn's disease.  Crohn's disease appears well-managed on Apriso 1.5 g  daily.    Having 1 solid bowel movement per day and denies rectal bleeding or abdominal pain.  She has not presented with any complications or required surgical intervention and appears to have achieved clinical remission. Although typically mesalamine's are not ideal for Crohn's disease studies do suggest that they may help in very mild case of Crohn's, given her age and absence of flares will continue with her current regimen at this time.    Of note, she did receive something from her insurance company stating they would no longer cover Apriso/mesalamine ER that we confirmed with her pharmacy who states that it appears her medication is still covered therefore we will continue with current regimen.  As she has not had inflammatory markers checked recently will update CRP and fecal calprotectin.  She has routine labs done frequently with her oncologist for her history of lung cancer.   PLAN:  -Continue Apriso 1.5 g daily -CRP and fecal calprotectin  All questions were answered, patient verbalized understanding and is in agreement with plan as outlined above.    Follow Up: 1 year   Jovie Swanner L. Jeanmarie Hubert, MSN, APRN, AGNP-C Adult-Gerontology Nurse Practitioner [facility]  I have reviewed the note and agree with the APP's assessment as described in this progress note  Katrinka Blazing, MD Gastroenterology and Hepatology [facility]

## 2023-06-06 DIAGNOSIS — K5010 Crohn's disease of large intestine without complications: Secondary | ICD-10-CM | POA: Diagnosis not present

## 2023-06-06 LAB — C-REACTIVE PROTEIN: CRP: <3 mg/L (ref <8.0)

## 2023-06-07 ENCOUNTER — Telehealth (INDEPENDENT_AMBULATORY_CARE_PROVIDER_SITE_OTHER): Payer: Self-pay | Admitting: *Deleted

## 2023-06-07 NOTE — Telephone Encounter (Signed)
Patient brought in letter from [employer]. Stating they filled a temporary supply of mesalamine 0.375gm. suggested alternative on letter are apriso, mesalamine er, pentasa, sulfasalazine and balsalazide. I called the pharmacy and was told that her med was going through and pharmacy did not see any issues with the medication not being covered. I let patient know what the pharmacy told and to call back if she has any issues filling her med.

## 2023-06-12 LAB — CALPROTECTIN: Calprotectin: 103 ug/g

## 2023-06-13 DIAGNOSIS — Z13220 Encounter for screening for lipoid disorders: Secondary | ICD-10-CM | POA: Diagnosis not present

## 2023-06-13 DIAGNOSIS — K5090 Crohn's disease, unspecified, without complications: Secondary | ICD-10-CM | POA: Diagnosis not present

## 2023-06-13 DIAGNOSIS — I5032 Chronic diastolic (congestive) heart failure: Secondary | ICD-10-CM | POA: Diagnosis not present

## 2023-06-13 DIAGNOSIS — I10 Essential (primary) hypertension: Secondary | ICD-10-CM | POA: Diagnosis not present

## 2023-06-14 ENCOUNTER — Ambulatory Visit: Payer: Medicare Other | Attending: Cardiology | Admitting: *Deleted

## 2023-06-14 DIAGNOSIS — I4891 Unspecified atrial fibrillation: Secondary | ICD-10-CM | POA: Diagnosis not present

## 2023-06-14 DIAGNOSIS — Z5181 Encounter for therapeutic drug level monitoring: Secondary | ICD-10-CM | POA: Diagnosis not present

## 2023-06-14 LAB — POCT INR: INR: 1.9 — AB (ref 2.0–3.0)

## 2023-06-14 NOTE — Patient Instructions (Signed)
Take 1 1/2 tablets tonight then resume 1 tablet daily  Back on brand name Iressa Be consistent with greens Recheck in 3 wks

## 2023-06-19 ENCOUNTER — Other Ambulatory Visit (HOSPITAL_COMMUNITY): Payer: Self-pay

## 2023-06-19 DIAGNOSIS — Z0001 Encounter for general adult medical examination with abnormal findings: Secondary | ICD-10-CM | POA: Diagnosis not present

## 2023-06-19 DIAGNOSIS — I10 Essential (primary) hypertension: Secondary | ICD-10-CM | POA: Diagnosis not present

## 2023-06-19 DIAGNOSIS — I5032 Chronic diastolic (congestive) heart failure: Secondary | ICD-10-CM | POA: Diagnosis not present

## 2023-06-19 DIAGNOSIS — Z681 Body mass index (BMI) 19 or less, adult: Secondary | ICD-10-CM | POA: Diagnosis not present

## 2023-06-19 DIAGNOSIS — K5090 Crohn's disease, unspecified, without complications: Secondary | ICD-10-CM | POA: Diagnosis not present

## 2023-06-19 DIAGNOSIS — I482 Chronic atrial fibrillation, unspecified: Secondary | ICD-10-CM | POA: Diagnosis not present

## 2023-06-19 NOTE — Progress Notes (Signed)
 Specialty Pharmacy Refill Coordination Note  Natalie Carlson is a 89 y.o. female contacted today regarding refills of specialty medication(s) Gefitinib  (Iressa )   Patient requested Delivery   Delivery date: 06/27/23   Verified address: [address] [place]  [address]   Medication will be filled on 06/26/23.

## 2023-06-20 ENCOUNTER — Other Ambulatory Visit: Payer: Self-pay

## 2023-07-03 ENCOUNTER — Telehealth: Payer: Self-pay | Admitting: Cardiology

## 2023-07-03 NOTE — Telephone Encounter (Signed)
Called and spoke with patient.  Informed gabapentin is not suppose to interfere with warfarin and it it a low dose she is starting.  Will check INR at appt next week.  She verbalized understanding.

## 2023-07-03 NOTE — Telephone Encounter (Signed)
Pt c/o medication issue:  1. Name of Medication: Gabapentin 100 mg 2x daily   2. How are you currently taking this medication (dosage and times per day)? 2x daily (200 mg daily)  3. Are you having a reaction (difficulty breathing--STAT)?   4. What is your medication issue? Patient is requesting call back from Vashti Hey, RN in regards to this medication her PCP would like her to start taking. Please advise.

## 2023-07-12 ENCOUNTER — Ambulatory Visit: Payer: Medicare Other | Attending: Cardiology | Admitting: *Deleted

## 2023-07-12 DIAGNOSIS — I4891 Unspecified atrial fibrillation: Secondary | ICD-10-CM | POA: Diagnosis not present

## 2023-07-12 DIAGNOSIS — Z5181 Encounter for therapeutic drug level monitoring: Secondary | ICD-10-CM

## 2023-07-12 LAB — POCT INR: INR: 2.4 (ref 2.0–3.0)

## 2023-07-12 NOTE — Patient Instructions (Signed)
 Continue warfarin 1 tablet daily  Back on brand name Iressa Be consistent with greens Recheck in 4 wks

## 2023-07-17 ENCOUNTER — Other Ambulatory Visit: Payer: Self-pay

## 2023-07-17 ENCOUNTER — Other Ambulatory Visit (HOSPITAL_COMMUNITY): Payer: Self-pay

## 2023-07-17 NOTE — Progress Notes (Signed)
 Specialty Pharmacy Refill Coordination Note  Natalie Carlson is a 89 y.o. female contacted today regarding refills of specialty medication(s) Gefitinib (Iressa)   Patient requested Delivery   Delivery date: 07/24/23   Verified address: [address]   [place] [address]   Medication will be filled on 07/23/23.

## 2023-07-23 ENCOUNTER — Inpatient Hospital Stay: Payer: Medicare Other | Attending: Physician Assistant

## 2023-07-23 ENCOUNTER — Inpatient Hospital Stay: Payer: Medicare Other | Admitting: Internal Medicine

## 2023-07-23 VITALS — BP 152/79 | HR 83 | Temp 98.0°F | Resp 16 | Ht 62.0 in | Wt 98.8 lb

## 2023-07-23 DIAGNOSIS — Z9089 Acquired absence of other organs: Secondary | ICD-10-CM | POA: Insufficient documentation

## 2023-07-23 DIAGNOSIS — C3490 Malignant neoplasm of unspecified part of unspecified bronchus or lung: Secondary | ICD-10-CM

## 2023-07-23 DIAGNOSIS — Z888 Allergy status to other drugs, medicaments and biological substances status: Secondary | ICD-10-CM | POA: Insufficient documentation

## 2023-07-23 DIAGNOSIS — C3411 Malignant neoplasm of upper lobe, right bronchus or lung: Secondary | ICD-10-CM | POA: Insufficient documentation

## 2023-07-23 DIAGNOSIS — Z860100 Personal history of colon polyps, unspecified: Secondary | ICD-10-CM | POA: Insufficient documentation

## 2023-07-23 DIAGNOSIS — Z8719 Personal history of other diseases of the digestive system: Secondary | ICD-10-CM | POA: Diagnosis not present

## 2023-07-23 DIAGNOSIS — Z886 Allergy status to analgesic agent status: Secondary | ICD-10-CM | POA: Diagnosis not present

## 2023-07-23 DIAGNOSIS — Z881 Allergy status to other antibiotic agents status: Secondary | ICD-10-CM | POA: Diagnosis not present

## 2023-07-23 DIAGNOSIS — I4891 Unspecified atrial fibrillation: Secondary | ICD-10-CM | POA: Diagnosis not present

## 2023-07-23 DIAGNOSIS — Z880 Allergy status to penicillin: Secondary | ICD-10-CM | POA: Insufficient documentation

## 2023-07-23 DIAGNOSIS — E039 Hypothyroidism, unspecified: Secondary | ICD-10-CM | POA: Diagnosis not present

## 2023-07-23 DIAGNOSIS — Z7901 Long term (current) use of anticoagulants: Secondary | ICD-10-CM | POA: Insufficient documentation

## 2023-07-23 DIAGNOSIS — I10 Essential (primary) hypertension: Secondary | ICD-10-CM | POA: Diagnosis not present

## 2023-07-23 DIAGNOSIS — Z79899 Other long term (current) drug therapy: Secondary | ICD-10-CM | POA: Diagnosis not present

## 2023-07-23 DIAGNOSIS — Z885 Allergy status to narcotic agent status: Secondary | ICD-10-CM | POA: Insufficient documentation

## 2023-07-23 DIAGNOSIS — K1379 Other lesions of oral mucosa: Secondary | ICD-10-CM | POA: Diagnosis not present

## 2023-07-23 LAB — CBC WITH DIFFERENTIAL (CANCER CENTER ONLY)
Abs Immature Granulocytes: 0 10*3/uL (ref 0.00–0.07)
Basophils Absolute: 0 10*3/uL (ref 0.0–0.1)
Basophils Relative: 1 %
Eosinophils Absolute: 0.3 10*3/uL (ref 0.0–0.5)
Eosinophils Relative: 7 %
HCT: 45.1 % (ref 36.0–46.0)
Hemoglobin: 14.4 g/dL (ref 12.0–15.0)
Immature Granulocytes: 0 %
Lymphocytes Relative: 22 %
Lymphs Abs: 0.9 10*3/uL (ref 0.7–4.0)
MCH: 29.9 pg (ref 26.0–34.0)
MCHC: 31.9 g/dL (ref 30.0–36.0)
MCV: 93.6 fL (ref 80.0–100.0)
Monocytes Absolute: 0.6 10*3/uL (ref 0.1–1.0)
Monocytes Relative: 14 %
Neutro Abs: 2.3 10*3/uL (ref 1.7–7.7)
Neutrophils Relative %: 56 %
Platelet Count: 249 10*3/uL (ref 150–400)
RBC: 4.82 MIL/uL (ref 3.87–5.11)
RDW: 13.4 % (ref 11.5–15.5)
WBC Count: 4.2 10*3/uL (ref 4.0–10.5)
nRBC: 0 % (ref 0.0–0.2)

## 2023-07-23 LAB — CMP (CANCER CENTER ONLY)
ALT: 14 U/L (ref 0–44)
AST: 25 U/L (ref 15–41)
Albumin: 4 g/dL (ref 3.5–5.0)
Alkaline Phosphatase: 91 U/L (ref 38–126)
Anion gap: 4 — ABNORMAL LOW (ref 5–15)
BUN: 21 mg/dL (ref 8–23)
CO2: 32 mmol/L (ref 22–32)
Calcium: 9 mg/dL (ref 8.9–10.3)
Chloride: 103 mmol/L (ref 98–111)
Creatinine: 0.75 mg/dL (ref 0.44–1.00)
GFR, Estimated: >60 mL/min (ref >60)
Glucose, Bld: 89 mg/dL (ref 70–99)
Potassium: 4 mmol/L (ref 3.5–5.1)
Sodium: 139 mmol/L (ref 135–145)
Total Bilirubin: 0.7 mg/dL (ref 0.0–1.2)
Total Protein: 7.3 g/dL (ref 6.5–8.1)

## 2023-07-23 NOTE — Progress Notes (Signed)
 [facility] [phone]   [phone]  OFFICE PROGRESS NOTE  [name], MD [address] [place] [address]  DIAGNOSIS: Recurrent non-small cell lung cancer presented with bilateral lung nodules in October 2016 initially diagnosed as a stage IA adenocarcinoma with positive EGFR mutation with deletion in exon 73 in December 2012.  PRIOR THERAPY: Status post bronchoscopy with right VATS and right upper lobectomy with lymph node dissection under the care of Dr. Tyrone Sage on 05/31/2011.  CURRENT THERAPY: Iressa 250 mg by mouth daily started 04/17/2015, status post 99 months of treatment.  INTERVAL HISTORY: Natalie Carlson 89 y.o. female returns to the clinic today for follow-up visit accompanied by her friend. Discussed the use of AI scribe software for clinical note transcription with the patient, who gave verbal consent to proceed.  History of Present Illness   The patient is a 89 year old with recurrent non-small cell lung cancer adenocarcinoma who presents for follow-up. She is accompanied by her friend.  She has a history of non-small cell lung cancer adenocarcinoma, initially diagnosed in October 2016, with prior surgery for stage I disease in 2012. She has been on gefitinib (Iressa) 250 mg once daily since December 2015, totaling 99 months of treatment. She feels fine today.  Her blood pressure was noted to be elevated during today's visit, although she regularly monitors it at home where it is typically within normal range. She uses a blood pressure kit provided by a retired Engineer, civil (consulting) friend who taught her how to use it.  She has a routine of getting her scans and labs done at [facility], and she has a system in place for scheduling these appointments. She relies on her friend for transportation, as her friend is busy with her great-grandchildren.       MEDICAL HISTORY: Past Medical History:  Diagnosis Date   Arthritis    Atrial  fibrillation (HCC)    Cataract of both eyes    Chronic back pain    Chronic diarrhea    Colitis    Hemorrhoids    History of colon polyps    History of migraines    Hyperlipidemia    Hypothyroidism    Macular degeneration, dry    Malignant neoplasm of right upper lobe of lung (HCC)    Stage 1A  EGFR positive  Deletion in exon 19.  ALK-negative  Right Upper Lobe Lung Mass  SUV 2.9 on PET Status post VATS and right upper lobe lobectomy 05/31/2011   Mucus in stool    Nephrolithiasis    1994 - passed on its on    ALLERGIES:  is allergic to doxycycline, entocort ec [budesonide], neurontin [gabapentin], vicodin [hydrocodone-acetaminophen], bactrim [sulfamethoxazole-trimethoprim], cephalexin, adhesive [tape], amoxicillin, chocolate, codeine, levaquin [levofloxacin in d5w], morphine and codeine, naprosyn [naproxen], penicillins, prednisone, tramadol, and uncoded nonscreenable allergen.  MEDICATIONS:  Current Outpatient Medications  Medication Sig Dispense Refill   acetaminophen (TYLENOL) 500 MG tablet Take 500 mg by mouth. Patient takes as needed , which is seldom.     Biotin 10 MG TABS Take 1 tablet by mouth daily.     Calcium Carbonate (CALCIUM 600 PO) Take 1 tablet by mouth 2 (two) times daily.     Cholecalciferol (D3 5000) 125 MCG (5000 UT) capsule Take 5,000 Units by mouth daily.     diltiazem (CARDIZEM CD) 180 MG 24 hr capsule TAKE ONE CAPSULE BY MOUTH EVERY DAY 90 capsule 2   Diphenhyd-Hydrocort-Nystatin (  FIRST-DUKES MOUTHWASH MT) Take 10 mLs by mouth every 6 (six) hours as needed (oral inflammation).     furosemide (LASIX) 20 MG tablet TAKE 1 TABLET BY MOUTH EVERY DAY AS NEEDED FOR FLUID 30 tablet 1   IRESSA 250 MG tablet Take 1 tablet (250 mg total) by mouth daily. 30 tablet 5   loperamide (IMODIUM A-D) 2 MG tablet Take 1 mg by mouth daily.     mesalamine (APRISO) 0.375 g 24 hr capsule TAKE TWO CAPSULES BY MOUTH TWICE DAILY 360 capsule 2   Multiple Vitamins-Minerals (AIRBORNE) CHEW  Chew 1 tablet by mouth daily.     Multiple Vitamins-Minerals (PRESERVISION AREDS 2 PO) Take 1 tablet by mouth 2 (two) times daily with a meal.     NON FORMULARY Eye In jection every 14 weeks. Both eyes are injected. Last injection was 02/23/2020.     Omega-3 Fatty Acids (FISH OIL) 1200 MG CAPS Take 1,200 mg by mouth daily.     OVER THE COUNTER MEDICATION Place 1 drop into both eyes See admin instructions. Soothe Eye drops uses 6-8 times per day.     potassium chloride (KLOR-CON M) 10 MEQ tablet TAKE 1 TABLET BY MOUTH EVERY DAY 30 tablet 1   warfarin (COUMADIN) 5 MG tablet TAKE 1 TABLET BY MOUTH DAILY OR AS DIRECTED by coumadin clinic 40 tablet 6   No current facility-administered medications for this visit.    SURGICAL HISTORY:  Past Surgical History:  Procedure Laterality Date   Bladder tack  1996   CARDIOVERSION  11/02/09   x 2   CATARACT EXTRACTION  2010/2011   COLONOSCOPY  3 YRS AGO   COLONOSCOPY  03/06/2011   Procedure: COLONOSCOPY;  Surgeon: Malissa Hippo, MD;  [facility];  Service: Endoscopy;  Laterality: N/A;  7:30 am   ESOPHAGOGASTRODUODENOSCOPY     FLEXIBLE SIGMOIDOSCOPY N/A 10/04/2012   Procedure: FLEXIBLE SIGMOIDOSCOPY;  Surgeon: Malissa Hippo, MD;  [facility];  Service: Endoscopy;  Laterality: N/A;  11:00-moved to 1015 Ann to notify pt   THYROID SURGERY  1961   TONSILLECTOMY     VIDEO BRONCHOSCOPY  05/31/2011   Procedure: VIDEO BRONCHOSCOPY;  Surgeon: Delight Ovens, MD;  Location: [facility] OR;  Service: Thoracic;  Laterality: N/A;    REVIEW OF SYSTEMS:  A comprehensive review of systems was negative.   PHYSICAL EXAMINATION: General appearance: alert, cooperative, and no distress Head: Normocephalic, without obvious abnormality, atraumatic Neck: no adenopathy, no JVD, supple, symmetrical, trachea midline, and thyroid not enlarged, symmetric, no tenderness/mass/nodules Lymph nodes: Cervical, supraclavicular, and axillary nodes normal. Resp:  clear to auscultation bilaterally Back: symmetric, no curvature. ROM normal. No CVA tenderness. Cardio: regular rate and rhythm, S1, S2 normal, no murmur, click, rub or gallop GI: soft, non-tender; bowel sounds normal; no masses,  no organomegaly Extremities: extremities normal, atraumatic, no cyanosis or edema  ECOG PERFORMANCE STATUS: 0 - Asymptomatic  Blood pressure (!) 152/79, pulse 83, temperature 98 F (36.7 C), temperature source Temporal, resp. rate 16, height 5\' 2"  (1.575 m), weight 98 lb 12.8 oz (44.8 kg), SpO2 100%.  LABORATORY DATA: Lab Results  Component Value Date   WBC 5.2 04/18/2023   HGB 14.6 04/18/2023   HCT 44.8 04/18/2023   MCV 95.1 04/18/2023   PLT 225 04/18/2023      Chemistry      Component Value Date/Time   NA 139 04/18/2023 1527   NA 141 05/14/2017 1304   K 4.1 04/18/2023 1527  K 4.3 05/14/2017 1304   CL 103 04/18/2023 1527   CO2 28 04/18/2023 1527   CO2 30 (H) 05/14/2017 1304   BUN 29 (H) 04/18/2023 1527   BUN 19.0 05/14/2017 1304   CREATININE 0.91 04/18/2023 1527   CREATININE 0.83 01/22/2023 1030   CREATININE 0.8 05/14/2017 1304      Component Value Date/Time   CALCIUM 9.5 04/18/2023 1527   CALCIUM 9.1 05/14/2017 1304   ALKPHOS 75 04/18/2023 1527   ALKPHOS 86 05/14/2017 1304   AST 29 04/18/2023 1527   AST 25 01/22/2023 1030   AST 25 05/14/2017 1304   ALT 19 04/18/2023 1527   ALT 14 01/22/2023 1030   ALT 15 05/14/2017 1304   BILITOT 0.7 04/18/2023 1527   BILITOT 0.7 01/22/2023 1030   BILITOT 0.57 05/14/2017 1304       RADIOGRAPHIC STUDIES: No results found.   ASSESSMENT AND PLAN:  This is a very pleasant 89 years old white female with recurrent non-small cell lung cancer, adenocarcinoma with positive EGFR mutation. The patient has been on treatment with Iressa 250 mg p.o. daily for the last 99  months. The patient has been tolerating her treatment with the brand-name Iressa fairly well but when switched to generic Gefitinib she  has significant issues with mouth sores before switching back to the brand name.   The patient has been tolerating her treatment fairly well.    Recurrent non-small cell lung cancer (NSCLC) adenocarcinoma Recurrent NSCLC adenocarcinoma, initially diagnosed in October 2016, with a prior stage I diagnosis in 2012. She has been on gefitinib (Iressa) 250 mg daily for 99 months, effectively managing her condition. She reports feeling well. - Continue gefitinib 250 mg once daily - Schedule a follow-up appointment in three months with a CT scan - Perform laboratory tests prior to the CT scan - Provide central scheduling contact number for assistance with scan and lab scheduling  Hypertension Reports elevated blood pressure readings during the visit, but states that her blood pressure is well-controlled at home with regular monitoring. The elevation is likely due to situational factors such as walking to the clinic and immediate measurement upon arrival. - Continue home blood pressure monitoring   She was advised to call immediately if she has any concerning symptoms in the interval. The patient voices understanding of current disease status and treatment options and is in agreement with the current care plan. All questions were answered. The patient knows to call the clinic with any problems, questions or concerns. We can certainly see the patient much sooner if necessary.  Disclaimer: This note was dictated with voice recognition software. Similar sounding words can inadvertently be transcribed and may not be corrected upon review.

## 2023-08-08 ENCOUNTER — Ambulatory Visit: Payer: Medicare Other | Attending: Cardiology | Admitting: *Deleted

## 2023-08-08 DIAGNOSIS — I4891 Unspecified atrial fibrillation: Secondary | ICD-10-CM

## 2023-08-08 DIAGNOSIS — Z5181 Encounter for therapeutic drug level monitoring: Secondary | ICD-10-CM | POA: Diagnosis not present

## 2023-08-08 LAB — POCT INR: INR: 2.3 (ref 2.0–3.0)

## 2023-08-08 NOTE — Patient Instructions (Signed)
 Continue warfarin 1 tablet daily  Back on brand name Iressa Be consistent with greens Recheck in 6 wks

## 2023-08-09 ENCOUNTER — Other Ambulatory Visit: Payer: Self-pay | Admitting: Cardiology

## 2023-08-13 ENCOUNTER — Other Ambulatory Visit: Payer: Self-pay

## 2023-08-13 ENCOUNTER — Other Ambulatory Visit (HOSPITAL_COMMUNITY): Payer: Self-pay

## 2023-08-13 ENCOUNTER — Other Ambulatory Visit: Payer: Self-pay | Admitting: Internal Medicine

## 2023-08-13 MED ORDER — IRESSA 250 MG PO TABS
250.0000 mg | ORAL_TABLET | Freq: Every day | ORAL | 5 refills | Status: DC
  Filled 2023-08-14: qty 30, 30d supply, fill #0
  Filled 2023-09-06: qty 30, 30d supply, fill #1
  Filled 2023-10-09: qty 30, 30d supply, fill #2
  Filled 2023-11-09: qty 30, 30d supply, fill #3
  Filled 2023-12-31: qty 30, 30d supply, fill #4
  Filled 2024-01-25: qty 30, 30d supply, fill #5

## 2023-08-13 NOTE — Progress Notes (Signed)
 Specialty Pharmacy Refill Coordination Note  Natalie Carlson is a 89 y.o. female contacted today regarding refills of specialty medication(s) Gefitinib (Iressa)   Patient requested Delivery   Delivery date: 08/21/23   Verified address: [address]   [place] [address]   /Medication will be filled on 08/20/23.

## 2023-08-14 ENCOUNTER — Other Ambulatory Visit: Payer: Self-pay

## 2023-08-20 ENCOUNTER — Other Ambulatory Visit: Payer: Self-pay

## 2023-08-28 DIAGNOSIS — H40052 Ocular hypertension, left eye: Secondary | ICD-10-CM | POA: Diagnosis not present

## 2023-08-28 DIAGNOSIS — H35371 Puckering of macula, right eye: Secondary | ICD-10-CM | POA: Diagnosis not present

## 2023-08-28 DIAGNOSIS — H353212 Exudative age-related macular degeneration, right eye, with inactive choroidal neovascularization: Secondary | ICD-10-CM | POA: Diagnosis not present

## 2023-08-28 DIAGNOSIS — H353223 Exudative age-related macular degeneration, left eye, with inactive scar: Secondary | ICD-10-CM | POA: Diagnosis not present

## 2023-08-28 DIAGNOSIS — H353133 Nonexudative age-related macular degeneration, bilateral, advanced atrophic without subfoveal involvement: Secondary | ICD-10-CM | POA: Diagnosis not present

## 2023-08-28 DIAGNOSIS — H35033 Hypertensive retinopathy, bilateral: Secondary | ICD-10-CM | POA: Diagnosis not present

## 2023-08-28 DIAGNOSIS — H04123 Dry eye syndrome of bilateral lacrimal glands: Secondary | ICD-10-CM | POA: Diagnosis not present

## 2023-08-28 DIAGNOSIS — H43813 Vitreous degeneration, bilateral: Secondary | ICD-10-CM | POA: Diagnosis not present

## 2023-09-03 DIAGNOSIS — L03031 Cellulitis of right toe: Secondary | ICD-10-CM | POA: Diagnosis not present

## 2023-09-03 DIAGNOSIS — Z681 Body mass index (BMI) 19 or less, adult: Secondary | ICD-10-CM | POA: Diagnosis not present

## 2023-09-04 ENCOUNTER — Telehealth: Payer: Self-pay | Admitting: *Deleted

## 2023-09-04 ENCOUNTER — Other Ambulatory Visit: Payer: Self-pay

## 2023-09-04 NOTE — Telephone Encounter (Signed)
 Pt called to let me know she has an infected ingrown toenail.  She was started on Keflex 500mg  bid.  Informed her Keflex should not interfer with her warfarin.  She has an INR appt on 09/19/23.  She appreciated callback.

## 2023-09-06 ENCOUNTER — Other Ambulatory Visit (HOSPITAL_COMMUNITY): Payer: Self-pay

## 2023-09-06 NOTE — Progress Notes (Signed)
 Specialty Pharmacy Refill Coordination Note  Natalie Carlson is a 89 y.o. female contacted today regarding refills of specialty medication(s) Gefitinib  (Iressa )   Patient requested Delivery   Delivery date: 09/18/23   Verified address: [address]   [place] [address]   Medication will be filled on 09/17/23.

## 2023-09-06 NOTE — Progress Notes (Signed)
 Specialty Pharmacy Ongoing Clinical Assessment Note  Natalie Carlson is a 89 y.o. female who is being followed by the specialty pharmacy service for RxSp Oncology   Patient's specialty medication(s) reviewed today: Gefitinib  (Iressa )   Missed doses in the last 4 weeks: 0   Patient/Caregiver did not have any additional questions or concerns.   Therapeutic benefit summary: Patient is achieving benefit   Adverse events/side effects summary: Experienced adverse events/side effects (occasional diarrhea, PRN Imodium on hand which helps)   Patient's therapy is appropriate to: Continue    Goals Addressed             This Visit's Progress    Slow Disease Progression   On track    Patient is on track. Patient will maintain adherence. Last CT from June 2024 showed stable disease.          Follow up:  6 months  Malachi Screws Specialty Pharmacist

## 2023-09-17 ENCOUNTER — Other Ambulatory Visit: Payer: Self-pay

## 2023-09-19 ENCOUNTER — Ambulatory Visit: Attending: Cardiology | Admitting: *Deleted

## 2023-09-19 DIAGNOSIS — Z5181 Encounter for therapeutic drug level monitoring: Secondary | ICD-10-CM

## 2023-09-19 DIAGNOSIS — I4891 Unspecified atrial fibrillation: Secondary | ICD-10-CM

## 2023-09-19 LAB — POCT INR: INR: 2.4 (ref 2.0–3.0)

## 2023-09-19 NOTE — Patient Instructions (Signed)
 Continue warfarin 1 tablet daily  Back on brand name Iressa Be consistent with greens Recheck in 6 wks

## 2023-10-09 ENCOUNTER — Other Ambulatory Visit (HOSPITAL_COMMUNITY): Payer: Self-pay

## 2023-10-09 ENCOUNTER — Other Ambulatory Visit: Payer: Self-pay | Admitting: Cardiology

## 2023-10-09 ENCOUNTER — Other Ambulatory Visit: Payer: Self-pay

## 2023-10-09 NOTE — Progress Notes (Signed)
 Specialty Pharmacy Refill Coordination Note  Natalie Carlson is a 89 y.o. female contacted today regarding refills of specialty medication(s) Gefitinib  (Iressa )   Patient requested Delivery   Delivery date: 10/16/23   Verified address: [address]   [place] [id number]   Medication will be filled on 10/15/23.

## 2023-10-22 ENCOUNTER — Other Ambulatory Visit: Payer: Self-pay

## 2023-10-22 DIAGNOSIS — C3490 Malignant neoplasm of unspecified part of unspecified bronchus or lung: Secondary | ICD-10-CM

## 2023-10-23 ENCOUNTER — Ambulatory Visit (HOSPITAL_COMMUNITY)
Admission: RE | Admit: 2023-10-23 | Discharge: 2023-10-23 | Disposition: A | Discharge status: Home / Self Care | Source: Ambulatory Visit | Attending: Internal Medicine | Admitting: Internal Medicine

## 2023-10-23 ENCOUNTER — Inpatient Hospital Stay: Attending: Physician Assistant

## 2023-10-23 ENCOUNTER — Inpatient Hospital Stay (HOSPITAL_COMMUNITY): Admit: 2023-10-23

## 2023-10-23 DIAGNOSIS — C3490 Malignant neoplasm of unspecified part of unspecified bronchus or lung: Secondary | ICD-10-CM | POA: Diagnosis not present

## 2023-10-23 DIAGNOSIS — Z885 Allergy status to narcotic agent status: Secondary | ICD-10-CM | POA: Diagnosis not present

## 2023-10-23 DIAGNOSIS — Z860100 Personal history of colon polyps, unspecified: Secondary | ICD-10-CM | POA: Insufficient documentation

## 2023-10-23 DIAGNOSIS — Z888 Allergy status to other drugs, medicaments and biological substances status: Secondary | ICD-10-CM | POA: Insufficient documentation

## 2023-10-23 DIAGNOSIS — Z881 Allergy status to other antibiotic agents status: Secondary | ICD-10-CM | POA: Diagnosis not present

## 2023-10-23 DIAGNOSIS — I7121 Aneurysm of the ascending aorta, without rupture: Secondary | ICD-10-CM | POA: Insufficient documentation

## 2023-10-23 DIAGNOSIS — I4891 Unspecified atrial fibrillation: Secondary | ICD-10-CM | POA: Diagnosis not present

## 2023-10-23 DIAGNOSIS — Z7901 Long term (current) use of anticoagulants: Secondary | ICD-10-CM | POA: Insufficient documentation

## 2023-10-23 DIAGNOSIS — Z79899 Other long term (current) drug therapy: Secondary | ICD-10-CM | POA: Insufficient documentation

## 2023-10-23 DIAGNOSIS — Z880 Allergy status to penicillin: Secondary | ICD-10-CM | POA: Insufficient documentation

## 2023-10-23 DIAGNOSIS — C3411 Malignant neoplasm of upper lobe, right bronchus or lung: Secondary | ICD-10-CM | POA: Diagnosis not present

## 2023-10-23 DIAGNOSIS — Z886 Allergy status to analgesic agent status: Secondary | ICD-10-CM | POA: Insufficient documentation

## 2023-10-23 DIAGNOSIS — Z902 Acquired absence of lung [part of]: Secondary | ICD-10-CM | POA: Insufficient documentation

## 2023-10-23 DIAGNOSIS — Z9089 Acquired absence of other organs: Secondary | ICD-10-CM | POA: Insufficient documentation

## 2023-10-23 DIAGNOSIS — I700 Atherosclerosis of aorta: Secondary | ICD-10-CM | POA: Diagnosis not present

## 2023-10-23 DIAGNOSIS — Z8719 Personal history of other diseases of the digestive system: Secondary | ICD-10-CM | POA: Insufficient documentation

## 2023-10-23 DIAGNOSIS — K1379 Other lesions of oral mucosa: Secondary | ICD-10-CM | POA: Diagnosis not present

## 2023-10-23 LAB — CBC WITH DIFFERENTIAL/PLATELET
Abs Immature Granulocytes: 0.01 10*3/uL (ref 0.00–0.07)
Basophils Absolute: 0 10*3/uL (ref 0.0–0.1)
Basophils Relative: 1 %
Eosinophils Absolute: 0.1 10*3/uL (ref 0.0–0.5)
Eosinophils Relative: 2 %
HCT: 45.1 % (ref 36.0–46.0)
Hemoglobin: 14.7 g/dL (ref 12.0–15.0)
Immature Granulocytes: 0 %
Lymphocytes Relative: 20 %
Lymphs Abs: 1.1 10*3/uL (ref 0.7–4.0)
MCH: 30.6 pg (ref 26.0–34.0)
MCHC: 32.6 g/dL (ref 30.0–36.0)
MCV: 94 fL (ref 80.0–100.0)
Monocytes Absolute: 0.6 10*3/uL (ref 0.1–1.0)
Monocytes Relative: 12 %
Neutro Abs: 3.5 10*3/uL (ref 1.7–7.7)
Neutrophils Relative %: 65 %
Platelets: 221 10*3/uL (ref 150–400)
RBC: 4.8 MIL/uL (ref 3.87–5.11)
RDW: 13.8 % (ref 11.5–15.5)
WBC: 5.4 10*3/uL (ref 4.0–10.5)
nRBC: 0 % (ref 0.0–0.2)

## 2023-10-23 LAB — COMPREHENSIVE METABOLIC PANEL WITH GFR
ALT: 18 U/L (ref 0–44)
AST: 29 U/L (ref 15–41)
Albumin: 3.9 g/dL (ref 3.5–5.0)
Alkaline Phosphatase: 93 U/L (ref 38–126)
Anion gap: 8 (ref 5–15)
BUN: 23 mg/dL (ref 8–23)
CO2: 27 mmol/L (ref 22–32)
Calcium: 9.5 mg/dL (ref 8.9–10.3)
Chloride: 102 mmol/L (ref 98–111)
Creatinine, Ser: 0.73 mg/dL (ref 0.44–1.00)
GFR, Estimated: >60 mL/min (ref >60)
Glucose, Bld: 95 mg/dL (ref 70–99)
Potassium: 4.5 mmol/L (ref 3.5–5.1)
Sodium: 137 mmol/L (ref 135–145)
Total Bilirubin: 0.8 mg/dL (ref 0.0–1.2)
Total Protein: 7.4 g/dL (ref 6.5–8.1)

## 2023-10-23 MED ORDER — IOHEXOL 300 MG/ML  SOLN
80.0000 mL | Freq: Once | INTRAMUSCULAR | Status: AC | PRN
  Administered 2023-10-23: 80 mL via INTRAVENOUS

## 2023-10-29 ENCOUNTER — Inpatient Hospital Stay: Admitting: Internal Medicine

## 2023-10-29 VITALS — BP 139/86 | HR 82 | Temp 97.6°F | Resp 16 | Ht 62.0 in | Wt 95.9 lb

## 2023-10-29 DIAGNOSIS — Z7901 Long term (current) use of anticoagulants: Secondary | ICD-10-CM | POA: Diagnosis not present

## 2023-10-29 DIAGNOSIS — C3411 Malignant neoplasm of upper lobe, right bronchus or lung: Secondary | ICD-10-CM

## 2023-10-29 DIAGNOSIS — Z860100 Personal history of colon polyps, unspecified: Secondary | ICD-10-CM | POA: Diagnosis not present

## 2023-10-29 DIAGNOSIS — I4891 Unspecified atrial fibrillation: Secondary | ICD-10-CM | POA: Diagnosis not present

## 2023-10-29 DIAGNOSIS — Z880 Allergy status to penicillin: Secondary | ICD-10-CM | POA: Diagnosis not present

## 2023-10-29 DIAGNOSIS — Z79899 Other long term (current) drug therapy: Secondary | ICD-10-CM | POA: Diagnosis not present

## 2023-10-29 DIAGNOSIS — I7121 Aneurysm of the ascending aorta, without rupture: Secondary | ICD-10-CM | POA: Diagnosis not present

## 2023-10-29 DIAGNOSIS — Z881 Allergy status to other antibiotic agents status: Secondary | ICD-10-CM | POA: Diagnosis not present

## 2023-10-29 DIAGNOSIS — Z902 Acquired absence of lung [part of]: Secondary | ICD-10-CM | POA: Diagnosis not present

## 2023-10-29 DIAGNOSIS — Z886 Allergy status to analgesic agent status: Secondary | ICD-10-CM | POA: Diagnosis not present

## 2023-10-29 DIAGNOSIS — Z885 Allergy status to narcotic agent status: Secondary | ICD-10-CM | POA: Diagnosis not present

## 2023-10-29 DIAGNOSIS — Z8719 Personal history of other diseases of the digestive system: Secondary | ICD-10-CM | POA: Diagnosis not present

## 2023-10-29 DIAGNOSIS — Z888 Allergy status to other drugs, medicaments and biological substances status: Secondary | ICD-10-CM | POA: Diagnosis not present

## 2023-10-29 DIAGNOSIS — K1379 Other lesions of oral mucosa: Secondary | ICD-10-CM | POA: Diagnosis not present

## 2023-10-29 DIAGNOSIS — Z9089 Acquired absence of other organs: Secondary | ICD-10-CM | POA: Diagnosis not present

## 2023-10-29 NOTE — Progress Notes (Signed)
 [facility] [phone]   [phone]) [phone]  OFFICE PROGRESS NOTE  Natalie Pulling, MD [address] [place] [address]  DIAGNOSIS: Recurrent non-small cell lung cancer presented with bilateral lung nodules in October 2016 initially diagnosed as a stage IA adenocarcinoma with positive EGFR mutation with deletion in exon 38 in December 2012.  PRIOR THERAPY: Status post bronchoscopy with right VATS and right upper lobectomy with lymph node dissection under the care of Dr. Nicanor Barge on 05/31/2011.  CURRENT THERAPY: Iressa  250 mg by mouth daily started 04/17/2015, status post 102 months of treatment.  INTERVAL HISTORY: Natalie Carlson 89 y.o. female returns to the clinic today for follow-up visit accompanied by her friend.  Discussed the use of AI scribe software for clinical note transcription with the patient, who gave verbal consent to proceed.  History of Present Illness   Natalie Carlson is a 89 year old female with recurrent non-small cell lung cancer who presents with scalp lesions. She is accompanied by her best friend. She was referred by Dr. Marlane Silver for suspected skin cancer.  She has a history of recurrent non-small cell lung cancer, initially diagnosed in December 2012 as stage 1A adenocarcinoma with a positive EGFR mutation (deletion in exon 19). She has been on gefitinib  250 mg daily since December 2016, marking 102 months of treatment. Recent chest scans and blood work indicate stable disease with no new concerns.  She reports developing sores and scabs on her scalp, which she describes as 'driving me up a wall'. Approximately four months ago, she consulted with Dr. Marlane Silver, who suspected skin cancer and recommended a consultation with Dr. Zaldivar. However, she has not pursued this due to her reluctance to undergo surgery. She is seeking alternative management options.  She has fair skin and a history of extensive sun exposure due to yard work, although  she states she covers up when outdoors. She is not using any new hair products and continues with her usual tea tree scalp care shampoo. No gastrointestinal symptoms such as diarrhea.       MEDICAL HISTORY: Past Medical History:  Diagnosis Date   Arthritis    Atrial fibrillation (HCC)    Cataract of both eyes    Chronic back pain    Chronic diarrhea    Colitis    Hemorrhoids    History of colon polyps    History of migraines    Hyperlipidemia    Hypothyroidism    Macular degeneration, dry    Malignant neoplasm of right upper lobe of lung (HCC)    Stage 1A  EGFR positive  Deletion in exon 19.  ALK-negative  Right Upper Lobe Lung Mass  SUV 2.9 on PET Status post VATS and right upper lobe lobectomy 05/31/2011   Mucus in stool    Nephrolithiasis    1994 - passed on its on    ALLERGIES:  is allergic to doxycycline, entocort ec [budesonide], neurontin [gabapentin], vicodin [hydrocodone-acetaminophen ], bactrim [sulfamethoxazole-trimethoprim], cephalexin, adhesive [tape], amoxicillin, chocolate, codeine, levaquin [levofloxacin in d5w], morphine and codeine, naprosyn [naproxen], penicillins, prednisone, tramadol , and uncoded nonscreenable allergen.  MEDICATIONS:  Current Outpatient Medications  Medication Sig Dispense Refill   acetaminophen  (TYLENOL ) 500 MG tablet Take 500 mg by mouth. Patient takes as needed , which is seldom.     Biotin 10 MG TABS Take 1 tablet by mouth daily.     Calcium Carbonate (CALCIUM 600 PO) Take 1 tablet by mouth 2 (two) times  daily.     Cholecalciferol (D3 5000) 125 MCG (5000 UT) capsule Take 5,000 Units by mouth daily.     diltiazem  (CARDIZEM  CD) 180 MG 24 hr capsule TAKE ONE CAPSULE BY MOUTH EVERY DAY 90 capsule 2   Diphenhyd-Hydrocort -Nystatin (FIRST-DUKES MOUTHWASH MT) Take 10 mLs by mouth every 6 (six) hours as needed (oral inflammation).     furosemide  (LASIX ) 20 MG tablet TAKE 1 TABLET BY MOUTH EVERY DAY AS NEEDED FOR FLUID 30 tablet 1   IRESSA  250 MG  tablet Take 1 tablet (250 mg total) by mouth daily. 30 tablet 5   loperamide (IMODIUM A-D) 2 MG tablet Take 1 mg by mouth daily.     mesalamine  (APRISO ) 0.375 g 24 hr capsule TAKE TWO CAPSULES BY MOUTH TWICE DAILY 360 capsule 2   Multiple Vitamins-Minerals (AIRBORNE) CHEW Chew 1 tablet by mouth daily.     Multiple Vitamins-Minerals (PRESERVISION AREDS 2 PO) Take 1 tablet by mouth 2 (two) times daily with a meal.     NON FORMULARY Eye In jection every 14 weeks. Both eyes are injected. Last injection was 02/23/2020.     Omega-3 Fatty Acids (FISH OIL) 1200 MG CAPS Take 1,200 mg by mouth daily.     OVER THE COUNTER MEDICATION Place 1 drop into both eyes See admin instructions. Soothe Eye drops uses 6-8 times per day.     potassium chloride  (KLOR-CON  M) 10 MEQ tablet TAKE 1 TABLET BY MOUTH EVERY DAY 30 tablet 1   warfarin (COUMADIN ) 5 MG tablet TAKE 1 TABLET BY MOUTH DAILY OR AS DIRECTED by coumadin  clinic 40 tablet 6   No current facility-administered medications for this visit.    SURGICAL HISTORY:  Past Surgical History:  Procedure Laterality Date   Bladder tack  1996   CARDIOVERSION  11/02/09   x 2   CATARACT EXTRACTION  2010/2011   COLONOSCOPY  3 YRS AGO   COLONOSCOPY  03/06/2011   Procedure: COLONOSCOPY;  Surgeon: Ruby Corporal, MD;  Location: [facility];  Service: Endoscopy;  Laterality: N/A;  7:30 am   ESOPHAGOGASTRODUODENOSCOPY     FLEXIBLE SIGMOIDOSCOPY N/A 10/04/2012   Procedure: FLEXIBLE SIGMOIDOSCOPY;  Surgeon: Ruby Corporal, MD;  Location: [facility];  Service: Endoscopy;  Laterality: N/A;  11:00-moved to 1015 Ann to notify pt   THYROID  SURGERY  1961   TONSILLECTOMY     VIDEO BRONCHOSCOPY  05/31/2011   Procedure: VIDEO BRONCHOSCOPY;  Surgeon: Norita Beauvais, MD;  Location: [facility] OR;  Service: Thoracic;  Laterality: N/A;    REVIEW OF SYSTEMS:  Constitutional: negative Eyes: negative Ears, nose, mouth, throat, and face: negative Respiratory:  negative Cardiovascular: negative Gastrointestinal: negative Genitourinary:negative Integument/breast: positive for rash and skin lesion(s) Hematologic/lymphatic: negative Musculoskeletal:negative Neurological: negative Behavioral/Psych: negative Endocrine: negative Allergic/Immunologic: negative   PHYSICAL EXAMINATION: General appearance: alert, cooperative, and no distress Head: Normocephalic, without obvious abnormality, atraumatic Neck: no adenopathy, no JVD, supple, symmetrical, trachea midline, and thyroid  not enlarged, symmetric, no tenderness/mass/nodules Lymph nodes: Cervical, supraclavicular, and axillary nodes normal. Resp: clear to auscultation bilaterally Back: symmetric, no curvature. ROM normal. No CVA tenderness. Cardio: regular rate and rhythm, S1, S2 normal, no murmur, click, rub or gallop GI: soft, non-tender; bowel sounds normal; no masses,  no organomegaly Extremities: extremities normal, atraumatic, no cyanosis or edema Neurologic: Alert and oriented X 3, normal strength and tone. Normal symmetric reflexes. Normal coordination and gait  ECOG PERFORMANCE STATUS: 0 - Asymptomatic  Blood pressure 139/86, pulse 82, temperature 97.6 F (  36.4 C), temperature source Oral, resp. rate 16, height 5' 2 (1.575 m), weight 95 lb 14.4 oz (43.5 kg), SpO2 96%.  LABORATORY DATA: Lab Results  Component Value Date   WBC 5.4 10/23/2023   HGB 14.7 10/23/2023   HCT 45.1 10/23/2023   MCV 94.0 10/23/2023   PLT 221 10/23/2023      Chemistry      Component Value Date/Time   NA 137 10/23/2023 0948   NA 141 05/14/2017 1304   K 4.5 10/23/2023 0948   K 4.3 05/14/2017 1304   CL 102 10/23/2023 0948   CO2 27 10/23/2023 0948   CO2 30 (H) 05/14/2017 1304   BUN 23 10/23/2023 0948   BUN 19.0 05/14/2017 1304   CREATININE 0.73 10/23/2023 0948   CREATININE 0.75 07/23/2023 1041   CREATININE 0.8 05/14/2017 1304      Component Value Date/Time   CALCIUM 9.5 10/23/2023 0948   CALCIUM  9.1 05/14/2017 1304   ALKPHOS 93 10/23/2023 0948   ALKPHOS 86 05/14/2017 1304   AST 29 10/23/2023 0948   AST 25 07/23/2023 1041   AST 25 05/14/2017 1304   ALT 18 10/23/2023 0948   ALT 14 07/23/2023 1041   ALT 15 05/14/2017 1304   BILITOT 0.8 10/23/2023 0948   BILITOT 0.7 07/23/2023 1041   BILITOT 0.57 05/14/2017 1304       RADIOGRAPHIC STUDIES: CT Chest W Contrast Result Date: 10/27/2023 EXAM: CT CHEST WITH CONTRAST 10/23/2023 10:41:03 AM TECHNIQUE: CT of the chest was performed with the administration of intravenous contrast. Multiplanar reformatted images are provided for review. Automated exposure control, iterative reconstruction, and/or weight based adjustment of the mA/kV was utilized to reduce the radiation dose to as low as reasonably achievable. COMPARISON: 04/18/2023 CLINICAL HISTORY: Non-small cell lung cancer (NSCLC), staging. Non-small cell lung cancer (NSCLC), staging, Malignant neoplasm of unspecified part of unspecified bronchus or lung (HCC) FINDINGS: MEDIASTINUM: Cardiomegaly with left atrial enlargement. 4.2 cm ascending thoracic aortic aneurysm, previously 4.0 cm, unchanged. Thoracic aortic atherosclerosis. Moderate coronary atherosclerosis of the LAD and right coronary artery. LYMPH NODES: No mediastinal, hilar or axillary lymphadenopathy. LUNGS AND PLEURA: Status post right upper lobectomy. 6 mm left apical nodule, unchanged. Additional mild stable subpleural nodularity anteriorly in the right lower lobe. Mild subpleural reticulation / fibrosis in the lungs bilaterally, unchanged. Trace right pleural fluid, new. SOFT TISSUES/BONES: Mild lower thoracic / upper lumbar levoscoliosis with degenerative changes. UPPER ABDOMEN: Atherosclerotic calcifications of the abdominal aorta. IMPRESSION: 1. Status post right upper lobectomy. 2. No recurrent or metastatic disease. 3. 4.2 cm ascending thoracic aortic aneurysm. Attention on follow-up is suggested. Electronically signed by: Zadie Herter MD 10/27/2023 01:34 AM EDT RP Workstation: [id number]     ASSESSMENT AND PLAN:  This is a very pleasant 89 years old white female with recurrent non-small cell lung cancer, adenocarcinoma with positive EGFR mutation. The patient has been on treatment with Iressa  250 mg p.o. daily for the last 102  months. The patient has been tolerating her treatment with the brand-name Iressa  fairly well but when switched to generic Gefitinib  she has significant issues with mouth sores before switching back to the brand name.   She has been tolerating her treatment fairly well. She had repeat CT scan of the chest performed recently.  I personally independently reviewed the scan and discussed the result with the patient today. Her scan showed no concerning findings for disease recurrence or metastasis. Assessment and Plan    Recurrent non-small cell lung cancer Recurrent  non-small cell lung cancer, initially diagnosed in December 2012 as stage IA adenocarcinoma with positive EGFR mutation (deletion in exon 19). Disease is well-managed on gefitinib  25 mg daily for 102 months with no progression on recent chest scan and blood work. - Continue gefitinib  25 mg daily - Schedule follow-up in 3 months with blood work  Scalp sores Scalp sores with scabs present. Differential diagnosis includes skin cancer, possibly due to prolonged sun exposure despite protective measures. Dermatology consultation recommended for further evaluation and management. She expressed reluctance to undergo surgery and prefers alternative treatments if possible. - Refer to dermatologist for evaluation of scalp sores - Discuss non-surgical options with dermatologist if skin cancer is confirmed   The patient was advised to call immediately if she has any concerning symptoms in the interval. The patient voices understanding of current disease status and treatment options and is in agreement with the current care plan. All questions  were answered. The patient knows to call the clinic with any problems, questions or concerns. We can certainly see the patient much sooner if necessary.  Disclaimer: This note was dictated with voice recognition software. Similar sounding words can inadvertently be transcribed and may not be corrected upon review.

## 2023-11-01 DIAGNOSIS — L309 Dermatitis, unspecified: Secondary | ICD-10-CM | POA: Diagnosis not present

## 2023-11-01 DIAGNOSIS — L219 Seborrheic dermatitis, unspecified: Secondary | ICD-10-CM | POA: Diagnosis not present

## 2023-11-01 DIAGNOSIS — L0390 Cellulitis, unspecified: Secondary | ICD-10-CM | POA: Diagnosis not present

## 2023-11-01 DIAGNOSIS — Z681 Body mass index (BMI) 19 or less, adult: Secondary | ICD-10-CM | POA: Diagnosis not present

## 2023-11-07 ENCOUNTER — Ambulatory Visit: Attending: Cardiology | Admitting: *Deleted

## 2023-11-07 DIAGNOSIS — I4891 Unspecified atrial fibrillation: Secondary | ICD-10-CM

## 2023-11-07 DIAGNOSIS — Z5181 Encounter for therapeutic drug level monitoring: Secondary | ICD-10-CM | POA: Diagnosis not present

## 2023-11-07 LAB — POCT INR: INR: 2.2 (ref 2.0–3.0)

## 2023-11-07 NOTE — Progress Notes (Signed)
Please see anticoagulation encounter.

## 2023-11-07 NOTE — Patient Instructions (Signed)
 Continue warfarin 1 tablet daily  Back on brand name Iressa Be consistent with greens Recheck in 6 wks

## 2023-11-09 ENCOUNTER — Other Ambulatory Visit (HOSPITAL_COMMUNITY): Payer: Self-pay

## 2023-11-09 ENCOUNTER — Other Ambulatory Visit: Payer: Self-pay

## 2023-11-09 NOTE — Progress Notes (Signed)
 Specialty Pharmacy Refill Coordination Note  Natalie Carlson is a 89 y.o. female contacted today regarding refills of specialty medication(s) Gefitinib  (Iressa )   Patient requested Delivery   Delivery date: 11/14/23   Verified address: [address]   [place] [phone]   Medication will be filled on 11/13/23.

## 2023-11-17 DIAGNOSIS — I482 Chronic atrial fibrillation, unspecified: Secondary | ICD-10-CM | POA: Diagnosis not present

## 2023-11-17 DIAGNOSIS — K5100 Ulcerative (chronic) pancolitis without complications: Secondary | ICD-10-CM | POA: Diagnosis not present

## 2023-11-17 DIAGNOSIS — I10 Essential (primary) hypertension: Secondary | ICD-10-CM | POA: Diagnosis not present

## 2023-11-17 DIAGNOSIS — Z681 Body mass index (BMI) 19 or less, adult: Secondary | ICD-10-CM | POA: Diagnosis not present

## 2023-11-17 DIAGNOSIS — I700 Atherosclerosis of aorta: Secondary | ICD-10-CM | POA: Diagnosis not present

## 2023-11-28 ENCOUNTER — Ambulatory Visit: Attending: Cardiology | Admitting: *Deleted

## 2023-11-28 DIAGNOSIS — I4891 Unspecified atrial fibrillation: Secondary | ICD-10-CM

## 2023-11-28 DIAGNOSIS — Z5181 Encounter for therapeutic drug level monitoring: Secondary | ICD-10-CM | POA: Diagnosis not present

## 2023-11-28 LAB — POCT INR: INR: 1.5 — AB (ref 2.0–3.0)

## 2023-11-28 NOTE — Patient Instructions (Signed)
 On doxycycline 100mg  2 x day x 7 days.  Has 2 days left.  Ate Extra Vit K per instructions. Take warfarin 1 1/2 tablets tonight then resume 1 tablet daily.  Cut back  on Vit K Back on brand name Iressa  Be consistent with greens Recheck in 3 wks

## 2023-12-04 DIAGNOSIS — H353232 Exudative age-related macular degeneration, bilateral, with inactive choroidal neovascularization: Secondary | ICD-10-CM | POA: Diagnosis not present

## 2023-12-04 DIAGNOSIS — H40052 Ocular hypertension, left eye: Secondary | ICD-10-CM | POA: Diagnosis not present

## 2023-12-04 DIAGNOSIS — H43813 Vitreous degeneration, bilateral: Secondary | ICD-10-CM | POA: Diagnosis not present

## 2023-12-04 DIAGNOSIS — H353133 Nonexudative age-related macular degeneration, bilateral, advanced atrophic without subfoveal involvement: Secondary | ICD-10-CM | POA: Diagnosis not present

## 2023-12-04 DIAGNOSIS — H04123 Dry eye syndrome of bilateral lacrimal glands: Secondary | ICD-10-CM | POA: Diagnosis not present

## 2023-12-04 DIAGNOSIS — H35033 Hypertensive retinopathy, bilateral: Secondary | ICD-10-CM | POA: Diagnosis not present

## 2023-12-04 DIAGNOSIS — H35371 Puckering of macula, right eye: Secondary | ICD-10-CM | POA: Diagnosis not present

## 2023-12-09 ENCOUNTER — Other Ambulatory Visit: Payer: Self-pay | Admitting: Cardiology

## 2023-12-14 DIAGNOSIS — I482 Chronic atrial fibrillation, unspecified: Secondary | ICD-10-CM | POA: Diagnosis not present

## 2023-12-14 DIAGNOSIS — I700 Atherosclerosis of aorta: Secondary | ICD-10-CM | POA: Diagnosis not present

## 2023-12-14 DIAGNOSIS — I10 Essential (primary) hypertension: Secondary | ICD-10-CM | POA: Diagnosis not present

## 2023-12-14 DIAGNOSIS — C3411 Malignant neoplasm of upper lobe, right bronchus or lung: Secondary | ICD-10-CM | POA: Diagnosis not present

## 2023-12-18 DIAGNOSIS — H43813 Vitreous degeneration, bilateral: Secondary | ICD-10-CM | POA: Diagnosis not present

## 2023-12-18 DIAGNOSIS — H353133 Nonexudative age-related macular degeneration, bilateral, advanced atrophic without subfoveal involvement: Secondary | ICD-10-CM | POA: Diagnosis not present

## 2023-12-18 DIAGNOSIS — H04123 Dry eye syndrome of bilateral lacrimal glands: Secondary | ICD-10-CM | POA: Diagnosis not present

## 2023-12-18 DIAGNOSIS — H35371 Puckering of macula, right eye: Secondary | ICD-10-CM | POA: Diagnosis not present

## 2023-12-18 DIAGNOSIS — H40052 Ocular hypertension, left eye: Secondary | ICD-10-CM | POA: Diagnosis not present

## 2023-12-18 DIAGNOSIS — H35033 Hypertensive retinopathy, bilateral: Secondary | ICD-10-CM | POA: Diagnosis not present

## 2023-12-18 DIAGNOSIS — H353232 Exudative age-related macular degeneration, bilateral, with inactive choroidal neovascularization: Secondary | ICD-10-CM | POA: Diagnosis not present

## 2023-12-19 ENCOUNTER — Ambulatory Visit: Attending: Cardiology | Admitting: *Deleted

## 2023-12-19 DIAGNOSIS — I4891 Unspecified atrial fibrillation: Secondary | ICD-10-CM | POA: Diagnosis not present

## 2023-12-19 DIAGNOSIS — Z5181 Encounter for therapeutic drug level monitoring: Secondary | ICD-10-CM | POA: Diagnosis not present

## 2023-12-19 LAB — POCT INR: INR: 2 (ref 2.0–3.0)

## 2023-12-19 NOTE — Patient Instructions (Signed)
 Continue warfarin 1 tablet daily  Back on brand name Iressa Be consistent with greens Recheck in 4 wks

## 2023-12-19 NOTE — Progress Notes (Signed)
 INR 2.0 Please see anticoagulation encounter.

## 2023-12-27 ENCOUNTER — Other Ambulatory Visit: Payer: Self-pay

## 2023-12-31 ENCOUNTER — Other Ambulatory Visit (HOSPITAL_COMMUNITY): Payer: Self-pay

## 2023-12-31 ENCOUNTER — Other Ambulatory Visit: Payer: Self-pay

## 2023-12-31 NOTE — Progress Notes (Signed)
 Specialty Pharmacy Refill Coordination Note  Natalie Carlson is a 89 y.o. female contacted today regarding refills of specialty medication(s) Gefitinib  (Iressa )   Patient requested Delivery   Delivery date: 01/02/24   Verified address: [name]   [place] [id number]   Medication will be filled on 01/01/24.

## 2024-01-01 ENCOUNTER — Other Ambulatory Visit: Payer: Self-pay

## 2024-01-02 DIAGNOSIS — G8929 Other chronic pain: Secondary | ICD-10-CM | POA: Diagnosis not present

## 2024-01-02 DIAGNOSIS — M792 Neuralgia and neuritis, unspecified: Secondary | ICD-10-CM | POA: Diagnosis not present

## 2024-01-02 DIAGNOSIS — K5100 Ulcerative (chronic) pancolitis without complications: Secondary | ICD-10-CM | POA: Diagnosis not present

## 2024-01-02 DIAGNOSIS — M545 Low back pain, unspecified: Secondary | ICD-10-CM | POA: Diagnosis not present

## 2024-01-02 DIAGNOSIS — L299 Pruritus, unspecified: Secondary | ICD-10-CM | POA: Diagnosis not present

## 2024-01-02 DIAGNOSIS — I482 Chronic atrial fibrillation, unspecified: Secondary | ICD-10-CM | POA: Diagnosis not present

## 2024-01-02 DIAGNOSIS — I10 Essential (primary) hypertension: Secondary | ICD-10-CM | POA: Diagnosis not present

## 2024-01-02 DIAGNOSIS — C3411 Malignant neoplasm of upper lobe, right bronchus or lung: Secondary | ICD-10-CM | POA: Diagnosis not present

## 2024-01-03 ENCOUNTER — Other Ambulatory Visit: Payer: Self-pay

## 2024-01-10 ENCOUNTER — Ambulatory Visit: Attending: Cardiology | Admitting: Cardiology

## 2024-01-10 ENCOUNTER — Encounter: Payer: Self-pay | Admitting: Cardiology

## 2024-01-10 VITALS — BP 130/78 | HR 77 | Ht 63.0 in | Wt 95.4 lb

## 2024-01-10 DIAGNOSIS — I4891 Unspecified atrial fibrillation: Secondary | ICD-10-CM | POA: Diagnosis not present

## 2024-01-10 DIAGNOSIS — I4821 Permanent atrial fibrillation: Secondary | ICD-10-CM | POA: Diagnosis not present

## 2024-01-10 DIAGNOSIS — E782 Mixed hyperlipidemia: Secondary | ICD-10-CM

## 2024-01-10 NOTE — Progress Notes (Signed)
    Cardiology Office Note  Date: 01/10/2024   ID: [id number] STOKES, DOB September 05, 1929, MRN [id number]  History of Present Illness: Natalie Carlson is a 89 y.o. female last seen in July 2024 by Ms. Miriam NP, I reviewed her note.  Our last visit was in 2023.  She is here for a routine visit.  Remains asymptomatic in terms of atrial fibrillation, no palpitations or chest discomfort.  She remains on Coumadin  with follow-up in the anticoagulation clinic, recent INR 2.0.  We went over her medications.  She remains on Cardizem  CD 180 mg daily with good heart rate control.  I reviewed her ECG today which shows rate controlled atrial fibrillation with rightward axis.  Physical Exam: VS:  BP 130/78 (BP Location: Left Arm)   Pulse 77   Ht 5' 3 (1.6 m)   Wt 95 lb 6.4 oz (43.3 kg)   SpO2 99%   BMI 16.90 kg/m , BMI Body mass index is 16.9 kg/m.  Wt Readings from Last 3 Encounters:  01/10/24 95 lb 6.4 oz (43.3 kg)  10/29/23 95 lb 14.4 oz (43.5 kg)  07/23/23 98 lb 12.8 oz (44.8 kg)    General: Patient appears comfortable at rest. HEENT: Conjunctiva and lids normal. Neck: Supple, no elevated JVP or carotid bruits. Lungs: Clear to auscultation, nonlabored breathing at rest. Cardiac: Irregularly irregular, no gallop, soft systolic murmur. Extremities: No pitting edema.  ECG:  An ECG dated 01/03/2023 was personally reviewed today and demonstrated:  Atrial fibrillation.  Labwork: January 2025: Cholesterol 235, triglycerides 90, HDL 100, LDL 120, TSH 3.31 10/23/2023: ALT 18; AST 29; BUN 23; Creatinine, Ser 0.73; Hemoglobin 14.7; Platelets 221; Potassium 4.5; Sodium 137   Other Studies Reviewed Today:  No interval cardiac testing for review today.  Assessment and Plan:  1.  Permanent atrial fibrillation with CHA2DS2-VASc score of 4.  She remains asymptomatic and has good heart rate control on Cardizem  CD 180 mg daily.  Continue Coumadin  with follow-up in the anticoagulation clinic.  2.  Mixed  hyperlipidemia with statin intolerance.  HDL 100 and LDL 120 in January.  Keep follow-up with PCP.  Would hold off on further medication at this point.  Disposition:  Follow up 1 year.  Signed, Jayson JUDITHANN Sierras, M.D., F.A.C.C. [facility]

## 2024-01-10 NOTE — Patient Instructions (Addendum)

## 2024-01-14 DIAGNOSIS — K5100 Ulcerative (chronic) pancolitis without complications: Secondary | ICD-10-CM | POA: Diagnosis not present

## 2024-01-14 DIAGNOSIS — C3411 Malignant neoplasm of upper lobe, right bronchus or lung: Secondary | ICD-10-CM | POA: Diagnosis not present

## 2024-01-14 DIAGNOSIS — I482 Chronic atrial fibrillation, unspecified: Secondary | ICD-10-CM | POA: Diagnosis not present

## 2024-01-14 DIAGNOSIS — I10 Essential (primary) hypertension: Secondary | ICD-10-CM | POA: Diagnosis not present

## 2024-01-16 ENCOUNTER — Encounter

## 2024-01-16 DIAGNOSIS — M792 Neuralgia and neuritis, unspecified: Secondary | ICD-10-CM | POA: Diagnosis not present

## 2024-01-16 DIAGNOSIS — L853 Xerosis cutis: Secondary | ICD-10-CM | POA: Diagnosis not present

## 2024-01-16 DIAGNOSIS — L84 Corns and callosities: Secondary | ICD-10-CM | POA: Diagnosis not present

## 2024-01-22 DIAGNOSIS — M545 Low back pain, unspecified: Secondary | ICD-10-CM | POA: Diagnosis not present

## 2024-01-22 DIAGNOSIS — I482 Chronic atrial fibrillation, unspecified: Secondary | ICD-10-CM | POA: Diagnosis not present

## 2024-01-22 DIAGNOSIS — M792 Neuralgia and neuritis, unspecified: Secondary | ICD-10-CM | POA: Diagnosis not present

## 2024-01-22 DIAGNOSIS — L299 Pruritus, unspecified: Secondary | ICD-10-CM | POA: Diagnosis not present

## 2024-01-22 DIAGNOSIS — L84 Corns and callosities: Secondary | ICD-10-CM | POA: Diagnosis not present

## 2024-01-22 DIAGNOSIS — G8929 Other chronic pain: Secondary | ICD-10-CM | POA: Diagnosis not present

## 2024-01-22 DIAGNOSIS — C3411 Malignant neoplasm of upper lobe, right bronchus or lung: Secondary | ICD-10-CM | POA: Diagnosis not present

## 2024-01-22 DIAGNOSIS — I10 Essential (primary) hypertension: Secondary | ICD-10-CM | POA: Diagnosis not present

## 2024-01-22 DIAGNOSIS — K5100 Ulcerative (chronic) pancolitis without complications: Secondary | ICD-10-CM | POA: Diagnosis not present

## 2024-01-23 ENCOUNTER — Ambulatory Visit: Attending: Cardiology | Admitting: *Deleted

## 2024-01-23 DIAGNOSIS — I4891 Unspecified atrial fibrillation: Secondary | ICD-10-CM | POA: Diagnosis not present

## 2024-01-23 DIAGNOSIS — Z5181 Encounter for therapeutic drug level monitoring: Secondary | ICD-10-CM

## 2024-01-23 LAB — POCT INR: INR: 3 (ref 2.0–3.0)

## 2024-01-25 ENCOUNTER — Other Ambulatory Visit: Payer: Self-pay | Admitting: Pharmacy Technician

## 2024-01-25 ENCOUNTER — Other Ambulatory Visit: Payer: Self-pay

## 2024-01-25 NOTE — Progress Notes (Signed)
 Specialty Pharmacy Refill Coordination Note  Natalie Carlson is a 89 y.o. female contacted today regarding refills of specialty medication(s) Gefitinib  (Iressa )   Patient requested Delivery   Delivery date: 01/29/24   Verified address: [address] [place]  [address]   Medication will be filled on 01/28/24.

## 2024-01-28 ENCOUNTER — Inpatient Hospital Stay: Attending: Physician Assistant

## 2024-01-28 ENCOUNTER — Other Ambulatory Visit: Payer: Self-pay

## 2024-01-28 ENCOUNTER — Inpatient Hospital Stay: Admitting: Internal Medicine

## 2024-01-28 VITALS — BP 141/75 | HR 81 | Temp 98.1°F | Resp 17 | Ht 63.0 in | Wt 95.7 lb

## 2024-01-28 DIAGNOSIS — C3411 Malignant neoplasm of upper lobe, right bronchus or lung: Secondary | ICD-10-CM | POA: Insufficient documentation

## 2024-01-28 DIAGNOSIS — C3490 Malignant neoplasm of unspecified part of unspecified bronchus or lung: Secondary | ICD-10-CM | POA: Diagnosis not present

## 2024-01-28 LAB — CBC WITH DIFFERENTIAL (CANCER CENTER ONLY)
Abs Immature Granulocytes: 0.01 K/uL (ref 0.00–0.07)
Basophils Absolute: 0 K/uL (ref 0.0–0.1)
Basophils Relative: 1 %
Eosinophils Absolute: 0.1 K/uL (ref 0.0–0.5)
Eosinophils Relative: 2 %
HCT: 41.9 % (ref 36.0–46.0)
Hemoglobin: 14 g/dL (ref 12.0–15.0)
Immature Granulocytes: 0 %
Lymphocytes Relative: 15 %
Lymphs Abs: 0.9 K/uL (ref 0.7–4.0)
MCH: 30.4 pg (ref 26.0–34.0)
MCHC: 33.4 g/dL (ref 30.0–36.0)
MCV: 91.1 fL (ref 80.0–100.0)
Monocytes Absolute: 0.8 K/uL (ref 0.1–1.0)
Monocytes Relative: 13 %
Neutro Abs: 4.3 K/uL (ref 1.7–7.7)
Neutrophils Relative %: 69 %
Platelet Count: 224 K/uL (ref 150–400)
RBC: 4.6 MIL/uL (ref 3.87–5.11)
RDW: 14.1 % (ref 11.5–15.5)
WBC Count: 6.1 K/uL (ref 4.0–10.5)
nRBC: 0 % (ref 0.0–0.2)

## 2024-01-28 LAB — CMP (CANCER CENTER ONLY)
ALT: 11 U/L (ref 0–44)
AST: 24 U/L (ref 15–41)
Albumin: 4.2 g/dL (ref 3.5–5.0)
Alkaline Phosphatase: 84 U/L (ref 38–126)
Anion gap: 5 (ref 5–15)
BUN: 20 mg/dL (ref 8–23)
CO2: 31 mmol/L (ref 22–32)
Calcium: 9.4 mg/dL (ref 8.9–10.3)
Chloride: 104 mmol/L (ref 98–111)
Creatinine: 0.87 mg/dL (ref 0.44–1.00)
GFR, Estimated: >60 mL/min (ref >60)
Glucose, Bld: 91 mg/dL (ref 70–99)
Potassium: 4.8 mmol/L (ref 3.5–5.1)
Sodium: 140 mmol/L (ref 135–145)
Total Bilirubin: 0.6 mg/dL (ref 0.0–1.2)
Total Protein: 7.4 g/dL (ref 6.5–8.1)

## 2024-01-28 MED ORDER — IRESSA 250 MG PO TABS
250.0000 mg | ORAL_TABLET | Freq: Every day | ORAL | 5 refills | Status: AC
  Filled 2024-01-28: qty 30, 30d supply, fill #0
  Filled 2024-02-21: qty 30, 30d supply, fill #1
  Filled 2024-03-19: qty 30, 30d supply, fill #2
  Filled 2024-04-18: qty 30, 30d supply, fill #3
  Filled 2024-05-16: qty 30, 30d supply, fill #4
  Filled 2024-06-16: qty 30, 30d supply, fill #5

## 2024-01-28 NOTE — Progress Notes (Signed)
 [facility] [phone]   [phone]  OFFICE PROGRESS NOTE  Natalie Jerel MATSU, MD [address] [place] [address]  DIAGNOSIS: Recurrent non-small cell lung cancer presented with bilateral lung nodules in October 2016 initially diagnosed as a stage IA adenocarcinoma with positive EGFR mutation with deletion in exon 19 in December 2012.  PRIOR THERAPY: Status post bronchoscopy with right VATS and right upper lobectomy with lymph node dissection under the care of Natalie Carlson on 05/31/2011.  CURRENT THERAPY: Iressa  250 mg by mouth daily started 04/17/2015, status post 105 months of treatment.  INTERVAL HISTORY: Natalie Carlson 89 y.o. female returns to the clinic today for follow-up visit accompanied by her friend.  Discussed the use of AI scribe software for clinical note transcription with the patient, who gave verbal consent to proceed.  History of Present Illness Natalie Carlson is a 89 year old female with recurrent non-small cell lung cancer who presents for evaluation and repeat blood work.  She has a history of non-small cell lung cancer, initially diagnosed in October 2006, with a recurrence diagnosed in December 2012. She underwent a right upper lobectomy with lymph node dissection in January 2013. Evidence of recurrent disease was noted in October 2016.  She has been on treatment with Iressa  250 mg orally once daily for 105 months due to a positive EGFR mutation with deletion in exon 19. She reports no side effects from Iressa , such as diarrhea or rash, although she mentions hair thinning.  She experiences shortness of breath when climbing hills or stairs but has no chest pain, cough, or other respiratory symptoms. She has recently stopped doing yard work due to her condition and has hired someone to assist with it.     MEDICAL HISTORY: Past Medical History:  Diagnosis Date   Arthritis    Atrial fibrillation (HCC)    Cataract of both eyes     Chronic back pain    Chronic diarrhea    Colitis    Hemorrhoids    History of colon polyps    History of migraines    Hyperlipidemia    Hypothyroidism    Macular degeneration, dry    Malignant neoplasm of right upper lobe of lung (HCC)    Stage 1A  EGFR positive  Deletion in exon 19.  ALK-negative  Right Upper Lobe Lung Mass  SUV 2.9 on PET Status post VATS and right upper lobe lobectomy 05/31/2011   Mucus in stool    Nephrolithiasis    1994 - passed on its on    ALLERGIES:  is allergic to doxycycline, entocort ec [budesonide], neurontin [gabapentin], vicodin [hydrocodone-acetaminophen ], bactrim [sulfamethoxazole-trimethoprim], cephalexin, adhesive [tape], amoxicillin, chocolate, codeine, levaquin [levofloxacin in d5w], morphine and codeine, naprosyn [naproxen], penicillins, prednisone, tramadol , and uncoded nonscreenable allergen.  MEDICATIONS:  Current Outpatient Medications  Medication Sig Dispense Refill   acetaminophen  (TYLENOL ) 500 MG tablet Take 500 mg by mouth. Patient takes as needed , which is seldom.     Biotin 10 MG TABS Take 1 tablet by mouth daily.     Calcium Carbonate (CALCIUM 600 PO) Take 1 tablet by mouth 2 (two) times daily.     Cholecalciferol (D3 5000) 125 MCG (5000 UT) capsule Take 5,000 Units by mouth daily.     diltiazem  (CARDIZEM  CD) 180 MG 24 hr capsule TAKE ONE CAPSULE BY MOUTH EVERY DAY 90 capsule 2   Diphenhyd-Hydrocort -Nystatin (FIRST-DUKES MOUTHWASH MT) Take 10 mLs by mouth every 6 (  six) hours as needed (oral inflammation).     furosemide  (LASIX ) 20 MG tablet TAKE 1 TABLET BY MOUTH EVERY DAY AS NEEDED FOR FLUID 30 tablet 1   gabapentin (NEURONTIN) 100 MG capsule Take 100 mg by mouth 2 (two) times daily.     IRESSA  250 MG tablet Take 1 tablet (250 mg total) by mouth daily. 30 tablet 5   latanoprost (XALATAN) 0.005 % ophthalmic solution Place 1 drop into the left eye at bedtime.     loperamide (IMODIUM A-D) 2 MG tablet Take 1 mg by mouth daily.      mesalamine  (APRISO ) 0.375 g 24 hr capsule TAKE TWO CAPSULES BY MOUTH TWICE DAILY 360 capsule 2   Multiple Vitamins-Minerals (AIRBORNE) CHEW Chew 1 tablet by mouth daily.     Multiple Vitamins-Minerals (PRESERVISION AREDS 2 PO) Take 1 tablet by mouth 2 (two) times daily with a meal.     NON FORMULARY Eye In jection every 14 weeks. Both eyes are injected. Last injection was 02/23/2020.     Omega-3 Fatty Acids (FISH OIL) 1200 MG CAPS Take 1,200 mg by mouth daily.     OVER THE COUNTER MEDICATION Place 1 drop into both eyes See admin instructions. Soothe Eye drops uses 6-8 times per day.     potassium chloride  (KLOR-CON  M) 10 MEQ tablet TAKE 1 TABLET BY MOUTH EVERY DAY 30 tablet 1   triamcinolone  cream (KENALOG ) 0.1 % Apply 1 Application topically 3 (three) times daily as needed.     warfarin (COUMADIN ) 5 MG tablet TAKE 1 TABLET BY MOUTH DAILY OR AS DIRECTED by coumadin  clinic 40 tablet 6   No current facility-administered medications for this visit.    SURGICAL HISTORY:  Past Surgical History:  Procedure Laterality Date   Bladder tack  1996   CARDIOVERSION  11/02/09   x 2   CATARACT EXTRACTION  2010/2011   COLONOSCOPY  3 YRS AGO   COLONOSCOPY  03/06/2011   Procedure: COLONOSCOPY;  Surgeon: Natalie RAYMOND Rivet, MD;  Location: [facility];  Service: Endoscopy;  Laterality: N/A;  7:30 am   ESOPHAGOGASTRODUODENOSCOPY     FLEXIBLE SIGMOIDOSCOPY N/A 10/04/2012   Procedure: FLEXIBLE SIGMOIDOSCOPY;  Surgeon: Natalie RAYMOND Rivet, MD;  Location: [facility];  Service: Endoscopy;  Laterality: N/A;  11:00-moved to 1015 Ann to notify pt   THYROID  SURGERY  1961   TONSILLECTOMY     VIDEO BRONCHOSCOPY  05/31/2011   Procedure: VIDEO BRONCHOSCOPY;  Surgeon: Natalie KATHEE Jude, MD;  Location: [facility] OR;  Service: Thoracic;  Laterality: N/A;    REVIEW OF SYSTEMS:  A comprehensive review of systems was negative except for: Respiratory: positive for dyspnea on exertion   PHYSICAL EXAMINATION: General appearance: alert,  cooperative, and no distress Head: Normocephalic, without obvious abnormality, atraumatic Neck: no adenopathy, no JVD, supple, symmetrical, trachea midline, and thyroid  not enlarged, symmetric, no tenderness/mass/nodules Lymph nodes: Cervical, supraclavicular, and axillary nodes normal. Resp: clear to auscultation bilaterally Back: symmetric, no curvature. ROM normal. No CVA tenderness. Cardio: regular rate and rhythm, S1, S2 normal, no murmur, click, rub or gallop GI: soft, non-tender; bowel sounds normal; no masses,  no organomegaly Extremities: extremities normal, atraumatic, no cyanosis or edema  ECOG PERFORMANCE STATUS: 0 - Asymptomatic  Blood pressure (!) 141/75, pulse 81, temperature 98.1 F (36.7 C), resp. rate 17, height 5' 3 (1.6 m), weight 95 lb 11.2 oz (43.4 kg), SpO2 100%.  LABORATORY DATA: Lab Results  Component Value Date   WBC 6.1 01/28/2024  HGB 14.0 01/28/2024   HCT 41.9 01/28/2024   MCV 91.1 01/28/2024   PLT 224 01/28/2024      Chemistry      Component Value Date/Time   NA 137 10/23/2023 0948   NA 141 05/14/2017 1304   K 4.5 10/23/2023 0948   K 4.3 05/14/2017 1304   CL 102 10/23/2023 0948   CO2 27 10/23/2023 0948   CO2 30 (H) 05/14/2017 1304   BUN 23 10/23/2023 0948   BUN 19.0 05/14/2017 1304   CREATININE 0.73 10/23/2023 0948   CREATININE 0.75 07/23/2023 1041   CREATININE 0.8 05/14/2017 1304      Component Value Date/Time   CALCIUM 9.5 10/23/2023 0948   CALCIUM 9.1 05/14/2017 1304   ALKPHOS 93 10/23/2023 0948   ALKPHOS 86 05/14/2017 1304   AST 29 10/23/2023 0948   AST 25 07/23/2023 1041   AST 25 05/14/2017 1304   ALT 18 10/23/2023 0948   ALT 14 07/23/2023 1041   ALT 15 05/14/2017 1304   BILITOT 0.8 10/23/2023 0948   BILITOT 0.7 07/23/2023 1041   BILITOT 0.57 05/14/2017 1304       RADIOGRAPHIC STUDIES: No results found.    ASSESSMENT AND PLAN:  This is a very pleasant 89 years old white female with recurrent non-small cell lung  cancer, adenocarcinoma with positive EGFR mutation. The patient has been on treatment with Iressa  250 mg p.o. daily for the last 105  months. The patient has been tolerating her treatment with the brand-name Iressa  fairly well but when switched to generic Gefitinib  she has significant issues with mouth sores before switching back to the brand name.   She has been tolerating her treatment fairly well. Assessment and Plan Assessment & Plan Recurrent non-small cell lung cancer, post right upper lobectomy Recurrent non-small cell lung cancer initially diagnosed in October 2006, with recurrence in October 2016. Status post right upper lobectomy with lymph node dissection in January 2013. Currently on iressa  250 mg PO daily for 105 months due to positive EGFR mutation with deletion in exon 19. No reported side effects such as diarrhea or rash. Normal CBC, awaiting chemistry results. - Continue Iressa  250 mg PO daily - Schedule follow-up in 3 months with repeat scan and lab work  Exertional dyspnea Exertional dyspnea during activities such as climbing hills or stairs. No acute changes or additional symptoms reported.  Alopecia She reports hair loss but is not distressed by it. - Visit dermatologist for further evaluation She was advised to call immediately if she has any concerning symptoms in the interval.   The patient voices understanding of current disease status and treatment options and is in agreement with the current care plan. All questions were answered. The patient knows to call the clinic with any problems, questions or concerns. We can certainly see the patient much sooner if necessary.  Disclaimer: This note was dictated with voice recognition software. Similar sounding words can inadvertently be transcribed and may not be corrected upon review.

## 2024-01-31 DIAGNOSIS — D2372 Other benign neoplasm of skin of left lower limb, including hip: Secondary | ICD-10-CM | POA: Diagnosis not present

## 2024-01-31 DIAGNOSIS — L2989 Other pruritus: Secondary | ICD-10-CM | POA: Diagnosis not present

## 2024-01-31 DIAGNOSIS — L658 Other specified nonscarring hair loss: Secondary | ICD-10-CM | POA: Diagnosis not present

## 2024-01-31 DIAGNOSIS — I872 Venous insufficiency (chronic) (peripheral): Secondary | ICD-10-CM | POA: Diagnosis not present

## 2024-02-10 ENCOUNTER — Other Ambulatory Visit: Payer: Self-pay | Admitting: Cardiology

## 2024-02-12 DIAGNOSIS — I10 Essential (primary) hypertension: Secondary | ICD-10-CM | POA: Diagnosis not present

## 2024-02-12 DIAGNOSIS — K5100 Ulcerative (chronic) pancolitis without complications: Secondary | ICD-10-CM | POA: Diagnosis not present

## 2024-02-12 DIAGNOSIS — I482 Chronic atrial fibrillation, unspecified: Secondary | ICD-10-CM | POA: Diagnosis not present

## 2024-02-12 DIAGNOSIS — M792 Neuralgia and neuritis, unspecified: Secondary | ICD-10-CM | POA: Diagnosis not present

## 2024-02-12 DIAGNOSIS — L299 Pruritus, unspecified: Secondary | ICD-10-CM | POA: Diagnosis not present

## 2024-02-12 DIAGNOSIS — C3411 Malignant neoplasm of upper lobe, right bronchus or lung: Secondary | ICD-10-CM | POA: Diagnosis not present

## 2024-02-12 DIAGNOSIS — M545 Low back pain, unspecified: Secondary | ICD-10-CM | POA: Diagnosis not present

## 2024-02-12 DIAGNOSIS — G8929 Other chronic pain: Secondary | ICD-10-CM | POA: Diagnosis not present

## 2024-02-12 DIAGNOSIS — L600 Ingrowing nail: Secondary | ICD-10-CM | POA: Diagnosis not present

## 2024-02-13 ENCOUNTER — Ambulatory Visit: Attending: Cardiology | Admitting: *Deleted

## 2024-02-13 DIAGNOSIS — I4891 Unspecified atrial fibrillation: Secondary | ICD-10-CM | POA: Diagnosis not present

## 2024-02-13 DIAGNOSIS — Z5181 Encounter for therapeutic drug level monitoring: Secondary | ICD-10-CM | POA: Diagnosis not present

## 2024-02-13 LAB — POCT INR: INR: 2.2 (ref 2.0–3.0)

## 2024-02-13 NOTE — Progress Notes (Signed)
 INR 2.2; Please see anticoagulation encounter

## 2024-02-13 NOTE — Patient Instructions (Signed)
 Continue warfarin 1 tablet daily.  Back on brand name Iressa  Eat extra greens today Recheck in 4 wks

## 2024-02-19 DIAGNOSIS — H353133 Nonexudative age-related macular degeneration, bilateral, advanced atrophic without subfoveal involvement: Secondary | ICD-10-CM | POA: Diagnosis not present

## 2024-02-19 DIAGNOSIS — H43813 Vitreous degeneration, bilateral: Secondary | ICD-10-CM | POA: Diagnosis not present

## 2024-02-19 DIAGNOSIS — H35371 Puckering of macula, right eye: Secondary | ICD-10-CM | POA: Diagnosis not present

## 2024-02-19 DIAGNOSIS — H353232 Exudative age-related macular degeneration, bilateral, with inactive choroidal neovascularization: Secondary | ICD-10-CM | POA: Diagnosis not present

## 2024-02-19 DIAGNOSIS — H04123 Dry eye syndrome of bilateral lacrimal glands: Secondary | ICD-10-CM | POA: Diagnosis not present

## 2024-02-19 DIAGNOSIS — H35033 Hypertensive retinopathy, bilateral: Secondary | ICD-10-CM | POA: Diagnosis not present

## 2024-02-19 DIAGNOSIS — H40052 Ocular hypertension, left eye: Secondary | ICD-10-CM | POA: Diagnosis not present

## 2024-02-21 ENCOUNTER — Other Ambulatory Visit: Payer: Self-pay

## 2024-02-21 NOTE — Progress Notes (Signed)
 Specialty Pharmacy Refill Coordination Note  Natalie Carlson is a 89 y.o. female contacted today regarding refills of specialty medication(s) Gefitinib  (Iressa )   Patient requested Delivery   Delivery date: 02/26/24   Verified address: [address] [place] [address]   Medication will be filled on 10.13.25.

## 2024-02-22 ENCOUNTER — Other Ambulatory Visit: Payer: Self-pay

## 2024-02-25 DIAGNOSIS — L638 Other alopecia areata: Secondary | ICD-10-CM | POA: Diagnosis not present

## 2024-02-25 DIAGNOSIS — I872 Venous insufficiency (chronic) (peripheral): Secondary | ICD-10-CM | POA: Diagnosis not present

## 2024-02-28 ENCOUNTER — Other Ambulatory Visit: Payer: Self-pay

## 2024-02-28 NOTE — Progress Notes (Signed)
 Specialty Pharmacy Ongoing Clinical Assessment Note  Natalie Carlson is a 89 y.o. female who is being followed by the specialty pharmacy service for RxSp Oncology   Patient's specialty medication(s) reviewed today: Gefitinib  (Iressa )   Missed doses in the last 4 weeks: 0   Patient/Caregiver did not have any additional questions or concerns.   Therapeutic benefit summary: Patient is achieving benefit   Adverse events/side effects summary: Experienced adverse events/side effects (occasional diarrhea but treats with imodium)   Patient's therapy is appropriate to: Continue    Goals Addressed             This Visit's Progress    Slow Disease Progression   On track    Patient is on track. Patient will maintain adherence. Per OV on 6/16, recent CT showed no concerning findings for disease recurrence or metastasis..          Follow up: 6 months  [facility]

## 2024-03-04 DIAGNOSIS — M545 Low back pain, unspecified: Secondary | ICD-10-CM | POA: Diagnosis not present

## 2024-03-04 DIAGNOSIS — L299 Pruritus, unspecified: Secondary | ICD-10-CM | POA: Diagnosis not present

## 2024-03-04 DIAGNOSIS — I10 Essential (primary) hypertension: Secondary | ICD-10-CM | POA: Diagnosis not present

## 2024-03-04 DIAGNOSIS — K5100 Ulcerative (chronic) pancolitis without complications: Secondary | ICD-10-CM | POA: Diagnosis not present

## 2024-03-04 DIAGNOSIS — G8929 Other chronic pain: Secondary | ICD-10-CM | POA: Diagnosis not present

## 2024-03-04 DIAGNOSIS — M792 Neuralgia and neuritis, unspecified: Secondary | ICD-10-CM | POA: Diagnosis not present

## 2024-03-04 DIAGNOSIS — I482 Chronic atrial fibrillation, unspecified: Secondary | ICD-10-CM | POA: Diagnosis not present

## 2024-03-04 DIAGNOSIS — L84 Corns and callosities: Secondary | ICD-10-CM | POA: Diagnosis not present

## 2024-03-04 DIAGNOSIS — C3411 Malignant neoplasm of upper lobe, right bronchus or lung: Secondary | ICD-10-CM | POA: Diagnosis not present

## 2024-03-04 DIAGNOSIS — L600 Ingrowing nail: Secondary | ICD-10-CM | POA: Diagnosis not present

## 2024-03-12 ENCOUNTER — Ambulatory Visit: Attending: Cardiology | Admitting: *Deleted

## 2024-03-12 DIAGNOSIS — I4891 Unspecified atrial fibrillation: Secondary | ICD-10-CM

## 2024-03-12 DIAGNOSIS — Z5181 Encounter for therapeutic drug level monitoring: Secondary | ICD-10-CM | POA: Diagnosis not present

## 2024-03-12 LAB — POCT INR: INR: 3.1 — AB (ref 2.0–3.0)

## 2024-03-12 NOTE — Progress Notes (Signed)
 INR 3.1  Please see anticoagulation encounter

## 2024-03-12 NOTE — Patient Instructions (Signed)
 Continue warfarin 1 tablet daily.  Back on brand name Iressa  Eat extra greens today Recheck in 6 wks

## 2024-03-19 ENCOUNTER — Other Ambulatory Visit: Payer: Self-pay

## 2024-03-19 ENCOUNTER — Other Ambulatory Visit (HOSPITAL_COMMUNITY): Payer: Self-pay

## 2024-03-19 NOTE — Progress Notes (Signed)
 Specialty Pharmacy Refill Coordination Note  Natalie Carlson is a 89 y.o. female contacted today regarding refills of specialty medication(s) Gefitinib  (Iressa )   Patient requested Delivery   Delivery date: 03/27/24   Verified address: [address] [place]  [address]   Medication will be filled on: 03/26/24

## 2024-03-26 ENCOUNTER — Other Ambulatory Visit: Payer: Self-pay

## 2024-04-18 ENCOUNTER — Other Ambulatory Visit: Payer: Self-pay

## 2024-04-18 NOTE — Progress Notes (Signed)
 Specialty Pharmacy Refill Coordination Note  Natalie Carlson is a 89 y.o. female contacted today regarding refills of specialty medication(s) Gefitinib  (Iressa )   Patient requested Delivery   Delivery date: 04/24/24   Verified address: [address] [place] [address]   Medication will be filled on: 04/23/24

## 2024-04-23 ENCOUNTER — Other Ambulatory Visit: Payer: Self-pay

## 2024-04-23 ENCOUNTER — Ambulatory Visit: Attending: Cardiology

## 2024-04-23 DIAGNOSIS — I4891 Unspecified atrial fibrillation: Secondary | ICD-10-CM

## 2024-04-23 DIAGNOSIS — Z5181 Encounter for therapeutic drug level monitoring: Secondary | ICD-10-CM

## 2024-04-23 LAB — POCT INR: INR: 4.2 — AB (ref 2.0–3.0)

## 2024-04-23 NOTE — Patient Instructions (Signed)
 Hold warfarin tonight then decrease dose to 1 tablet daily except 1/2 tablet on Thursdays.  Back on brand name Iressa  Eat extra greens today Recheck in 2 wks

## 2024-04-23 NOTE — Progress Notes (Signed)
 INR 4.2. Please see anticoagulation encounter

## 2024-04-25 ENCOUNTER — Ambulatory Visit (HOSPITAL_COMMUNITY)
Admission: RE | Admit: 2024-04-25 | Discharge: 2024-04-25 | Disposition: A | Source: Ambulatory Visit | Attending: Internal Medicine | Admitting: Internal Medicine

## 2024-04-25 DIAGNOSIS — C3490 Malignant neoplasm of unspecified part of unspecified bronchus or lung: Secondary | ICD-10-CM

## 2024-04-25 LAB — POCT I-STAT CREATININE: Creatinine, Ser: 1.1 mg/dL — ABNORMAL HIGH (ref 0.44–1.00)

## 2024-04-25 MED ORDER — IOHEXOL 300 MG/ML  SOLN
75.0000 mL | Freq: Once | INTRAMUSCULAR | Status: AC | PRN
  Administered 2024-04-25: 60 mL via INTRAVENOUS

## 2024-04-28 ENCOUNTER — Inpatient Hospital Stay

## 2024-04-28 ENCOUNTER — Inpatient Hospital Stay: Attending: Physician Assistant | Admitting: Internal Medicine

## 2024-04-28 ENCOUNTER — Telehealth: Payer: Self-pay

## 2024-04-28 VITALS — BP 152/91 | HR 88 | Temp 98.0°F | Resp 17 | Ht 63.0 in | Wt 98.5 lb

## 2024-04-28 DIAGNOSIS — Z886 Allergy status to analgesic agent status: Secondary | ICD-10-CM | POA: Insufficient documentation

## 2024-04-28 DIAGNOSIS — Z79899 Other long term (current) drug therapy: Secondary | ICD-10-CM | POA: Insufficient documentation

## 2024-04-28 DIAGNOSIS — C3411 Malignant neoplasm of upper lobe, right bronchus or lung: Secondary | ICD-10-CM

## 2024-04-28 DIAGNOSIS — I77810 Thoracic aortic ectasia: Secondary | ICD-10-CM | POA: Diagnosis not present

## 2024-04-28 DIAGNOSIS — Z881 Allergy status to other antibiotic agents status: Secondary | ICD-10-CM | POA: Insufficient documentation

## 2024-04-28 DIAGNOSIS — Z860100 Personal history of colon polyps, unspecified: Secondary | ICD-10-CM | POA: Diagnosis not present

## 2024-04-28 DIAGNOSIS — Z8719 Personal history of other diseases of the digestive system: Secondary | ICD-10-CM | POA: Diagnosis not present

## 2024-04-28 DIAGNOSIS — J90 Pleural effusion, not elsewhere classified: Secondary | ICD-10-CM | POA: Diagnosis not present

## 2024-04-28 DIAGNOSIS — Z888 Allergy status to other drugs, medicaments and biological substances status: Secondary | ICD-10-CM | POA: Diagnosis not present

## 2024-04-28 DIAGNOSIS — R5383 Other fatigue: Secondary | ICD-10-CM | POA: Insufficient documentation

## 2024-04-28 DIAGNOSIS — H547 Unspecified visual loss: Secondary | ICD-10-CM | POA: Insufficient documentation

## 2024-04-28 DIAGNOSIS — Z9849 Cataract extraction status, unspecified eye: Secondary | ICD-10-CM | POA: Insufficient documentation

## 2024-04-28 DIAGNOSIS — Z7901 Long term (current) use of anticoagulants: Secondary | ICD-10-CM | POA: Insufficient documentation

## 2024-04-28 DIAGNOSIS — I2510 Atherosclerotic heart disease of native coronary artery without angina pectoris: Secondary | ICD-10-CM | POA: Diagnosis not present

## 2024-04-28 DIAGNOSIS — Z880 Allergy status to penicillin: Secondary | ICD-10-CM | POA: Insufficient documentation

## 2024-04-28 DIAGNOSIS — G8929 Other chronic pain: Secondary | ICD-10-CM | POA: Insufficient documentation

## 2024-04-28 DIAGNOSIS — Z9089 Acquired absence of other organs: Secondary | ICD-10-CM | POA: Insufficient documentation

## 2024-04-28 DIAGNOSIS — Z87442 Personal history of urinary calculi: Secondary | ICD-10-CM | POA: Diagnosis not present

## 2024-04-28 DIAGNOSIS — I4891 Unspecified atrial fibrillation: Secondary | ICD-10-CM | POA: Insufficient documentation

## 2024-04-28 DIAGNOSIS — I517 Cardiomegaly: Secondary | ICD-10-CM | POA: Diagnosis not present

## 2024-04-28 DIAGNOSIS — I700 Atherosclerosis of aorta: Secondary | ICD-10-CM | POA: Insufficient documentation

## 2024-04-28 DIAGNOSIS — H539 Unspecified visual disturbance: Secondary | ICD-10-CM | POA: Diagnosis not present

## 2024-04-28 DIAGNOSIS — R0609 Other forms of dyspnea: Secondary | ICD-10-CM | POA: Insufficient documentation

## 2024-04-28 DIAGNOSIS — K1379 Other lesions of oral mucosa: Secondary | ICD-10-CM | POA: Diagnosis not present

## 2024-04-28 DIAGNOSIS — Z882 Allergy status to sulfonamides status: Secondary | ICD-10-CM | POA: Diagnosis not present

## 2024-04-28 DIAGNOSIS — Z885 Allergy status to narcotic agent status: Secondary | ICD-10-CM | POA: Diagnosis not present

## 2024-04-28 DIAGNOSIS — Z9889 Other specified postprocedural states: Secondary | ICD-10-CM | POA: Insufficient documentation

## 2024-04-28 DIAGNOSIS — C3490 Malignant neoplasm of unspecified part of unspecified bronchus or lung: Secondary | ICD-10-CM

## 2024-04-28 DIAGNOSIS — Z902 Acquired absence of lung [part of]: Secondary | ICD-10-CM | POA: Insufficient documentation

## 2024-04-28 LAB — CBC WITH DIFFERENTIAL (CANCER CENTER ONLY)
Abs Immature Granulocytes: 0.01 K/uL (ref 0.00–0.07)
Basophils Absolute: 0 K/uL (ref 0.0–0.1)
Basophils Relative: 1 %
Eosinophils Absolute: 0.2 K/uL (ref 0.0–0.5)
Eosinophils Relative: 3 %
HCT: 41 % (ref 36.0–46.0)
Hemoglobin: 13.7 g/dL (ref 12.0–15.0)
Immature Granulocytes: 0 %
Lymphocytes Relative: 19 %
Lymphs Abs: 1.1 K/uL (ref 0.7–4.0)
MCH: 30.6 pg (ref 26.0–34.0)
MCHC: 33.4 g/dL (ref 30.0–36.0)
MCV: 91.5 fL (ref 80.0–100.0)
Monocytes Absolute: 0.8 K/uL (ref 0.1–1.0)
Monocytes Relative: 14 %
Neutro Abs: 3.6 K/uL (ref 1.7–7.7)
Neutrophils Relative %: 63 %
Platelet Count: 230 K/uL (ref 150–400)
RBC: 4.48 MIL/uL (ref 3.87–5.11)
RDW: 14 % (ref 11.5–15.5)
WBC Count: 5.7 K/uL (ref 4.0–10.5)
nRBC: 0 % (ref 0.0–0.2)

## 2024-04-28 LAB — CMP (CANCER CENTER ONLY)
ALT: 13 U/L (ref 0–44)
AST: 35 U/L (ref 15–41)
Albumin: 4.3 g/dL (ref 3.5–5.0)
Alkaline Phosphatase: 112 U/L (ref 38–126)
Anion gap: 9 (ref 5–15)
BUN: 22 mg/dL (ref 8–23)
CO2: 29 mmol/L (ref 22–32)
Calcium: 9.5 mg/dL (ref 8.9–10.3)
Chloride: 103 mmol/L (ref 98–111)
Creatinine: 0.87 mg/dL (ref 0.44–1.00)
GFR, Estimated: >60 mL/min (ref >60)
Glucose, Bld: 94 mg/dL (ref 70–99)
Potassium: 4.2 mmol/L (ref 3.5–5.1)
Sodium: 142 mmol/L (ref 135–145)
Total Bilirubin: 0.6 mg/dL (ref 0.0–1.2)
Total Protein: 7.8 g/dL (ref 6.5–8.1)

## 2024-04-28 NOTE — Telephone Encounter (Signed)
 Patient called and LVM stating that she did not get labs prior to CT scan last week.    Tried to contact patient and LVM stating that a lab appt was made prior to appt with Dr. Sherrod today.  Asked for a return call with any questions.

## 2024-04-28 NOTE — Progress Notes (Signed)
 [facility] [phone]   [phone]  OFFICE PROGRESS NOTE  [name], MD [address] [place] [address]  DIAGNOSIS: Recurrent non-small cell lung cancer presented with bilateral lung nodules in October 2016 initially diagnosed as a stage IA adenocarcinoma with positive EGFR mutation with deletion in exon 19 in December 2012.  PRIOR THERAPY: Status post bronchoscopy with right VATS and right upper lobectomy with lymph node dissection under the care of Dr. Army on 05/31/2011.  CURRENT THERAPY: Iressa  250 mg by mouth daily started 04/17/2015, status post 108 months of treatment.  INTERVAL HISTORY: Natalie Carlson 89 y.o. female returns to the clinic today for follow-up visit accompanied by her friend.  Discussed the use of AI scribe software for clinical note transcription with the patient, who gave verbal consent to proceed.  History of Present Illness Natalie Carlson is a 89 year old female with recurrent EGFR-mutated non-small cell lung adenocarcinoma who presents for routine oncology follow-up and restaging imaging.  She was diagnosed with non-small cell lung adenocarcinoma with EGFR exon 19 deletion in December 2012 and has been treated with gefitinib  250 mg orally daily since December 2016.  She denies new symptoms. She has no chest pain, new cough, or worsening dyspnea. She experiences exertional dyspnea with activities such as walking or climbing hills, which has led her to discontinue yard work and limit driving, also due to decreased vision.  She reports experiencing side effects from gefitinib  but continues to take the medication.  She recently underwent laboratory testing to monitor renal function at an outside facility.  She lost her son a months ago.  MEDICAL HISTORY: Past Medical History:  Diagnosis Date   Arthritis    Atrial fibrillation (HCC)    Cataract of both eyes    Chronic back pain    Chronic diarrhea    Colitis     Hemorrhoids    History of colon polyps    History of migraines    Hyperlipidemia    Hypothyroidism    Macular degeneration, dry    Malignant neoplasm of right upper lobe of lung (HCC)    Stage 1A  EGFR positive  Deletion in exon 19.  ALK-negative  Right Upper Lobe Lung Mass  SUV 2.9 on PET Status post VATS and right upper lobe lobectomy 05/31/2011   Mucus in stool    Nephrolithiasis    1994 - passed on its on    ALLERGIES:  is allergic to doxycycline, entocort ec [budesonide], neurontin [gabapentin], vicodin [hydrocodone-acetaminophen ], bactrim [sulfamethoxazole-trimethoprim], cephalexin, adhesive [tape], amoxicillin, chocolate, codeine, levaquin [levofloxacin in d5w], morphine and codeine, naprosyn [naproxen], penicillins, prednisone, tramadol , and uncoded nonscreenable allergen.  MEDICATIONS:  Current Outpatient Medications  Medication Sig Dispense Refill   acetaminophen  (TYLENOL ) 500 MG tablet Take 500 mg by mouth. Patient takes as needed , which is seldom.     Biotin 10 MG TABS Take 1 tablet by mouth daily.     Calcium Carbonate (CALCIUM 600 PO) Take 1 tablet by mouth 2 (two) times daily.     Cholecalciferol (D3 5000) 125 MCG (5000 UT) capsule Take 5,000 Units by mouth daily.     diltiazem  (CARDIZEM  CD) 180 MG 24 hr capsule TAKE ONE CAPSULE BY MOUTH EVERY DAY 90 capsule 2   Diphenhyd-Hydrocort -Nystatin (FIRST-DUKES MOUTHWASH MT) Take 10 mLs by mouth every 6 (six) hours as needed (oral inflammation).     furosemide  (LASIX ) 20 MG tablet TAKE 1 TABLET BY MOUTH EVERY DAY  AS NEEDED FOR FLUID 90 tablet 3   gabapentin (NEURONTIN) 100 MG capsule Take 100 mg by mouth 2 (two) times daily.     IRESSA  250 MG tablet Take 1 tablet (250 mg total) by mouth daily. 30 tablet 5   latanoprost (XALATAN) 0.005 % ophthalmic solution Place 1 drop into the left eye at bedtime.     loperamide (IMODIUM A-D) 2 MG tablet Take 1 mg by mouth daily.     mesalamine  (APRISO ) 0.375 g 24 hr capsule TAKE TWO CAPSULES  BY MOUTH TWICE DAILY 360 capsule 2   Multiple Vitamins-Minerals (AIRBORNE) CHEW Chew 1 tablet by mouth daily.     Multiple Vitamins-Minerals (PRESERVISION AREDS 2 PO) Take 1 tablet by mouth 2 (two) times daily with a meal.     NON FORMULARY Eye In jection every 14 weeks. Both eyes are injected. Last injection was 02/23/2020.     Omega-3 Fatty Acids (FISH OIL) 1200 MG CAPS Take 1,200 mg by mouth daily.     OVER THE COUNTER MEDICATION Place 1 drop into both eyes See admin instructions. Soothe Eye drops uses 6-8 times per day.     potassium chloride  (KLOR-CON  M) 10 MEQ tablet TAKE 1 TABLET BY MOUTH EVERY DAY 90 tablet 3   triamcinolone  cream (KENALOG ) 0.1 % Apply 1 Application topically 3 (three) times daily as needed.     warfarin (COUMADIN ) 5 MG tablet TAKE 1 TABLET BY MOUTH DAILY OR AS DIRECTED by coumadin  clinic 40 tablet 6   No current facility-administered medications for this visit.    SURGICAL HISTORY:  Past Surgical History:  Procedure Laterality Date   Bladder tack  1996   CARDIOVERSION  11/02/09   x 2   CATARACT EXTRACTION  2010/2011   COLONOSCOPY  3 YRS AGO   COLONOSCOPY  03/06/2011   Procedure: COLONOSCOPY;  Surgeon: Claudis RAYMOND Rivet, MD;  Location: [facility];  Service: Endoscopy;  Laterality: N/A;  7:30 am   ESOPHAGOGASTRODUODENOSCOPY     FLEXIBLE SIGMOIDOSCOPY N/A 10/04/2012   Procedure: FLEXIBLE SIGMOIDOSCOPY;  Surgeon: Claudis RAYMOND Rivet, MD;  Location: [facility];  Service: Endoscopy;  Laterality: N/A;  11:00-moved to 1015 Ann to notify pt   THYROID  SURGERY  1961   TONSILLECTOMY     VIDEO BRONCHOSCOPY  05/31/2011   Procedure: VIDEO BRONCHOSCOPY;  Surgeon: Dallas KATHEE Jude, MD;  Location: [facility] OR;  Service: Thoracic;  Laterality: N/A;    REVIEW OF SYSTEMS:  Constitutional: positive for fatigue Eyes: positive for visual disturbance Ears, nose, mouth, throat, and face: negative Respiratory: negative Cardiovascular: negative Gastrointestinal:  negative Genitourinary:negative Integument/breast: negative Hematologic/lymphatic: negative Musculoskeletal:negative Neurological: negative Behavioral/Psych: negative Endocrine: negative Allergic/Immunologic: negative   PHYSICAL EXAMINATION: General appearance: alert, cooperative, and no distress Head: Normocephalic, without obvious abnormality, atraumatic Neck: no adenopathy, no JVD, supple, symmetrical, trachea midline, and thyroid  not enlarged, symmetric, no tenderness/mass/nodules Lymph nodes: Cervical, supraclavicular, and axillary nodes normal. Resp: clear to auscultation bilaterally Back: symmetric, no curvature. ROM normal. No CVA tenderness. Cardio: regular rate and rhythm, S1, S2 normal, no murmur, click, rub or gallop GI: soft, non-tender; bowel sounds normal; no masses,  no organomegaly Extremities: extremities normal, atraumatic, no cyanosis or edema Neurologic: Alert and oriented X 3, normal strength and tone. Normal symmetric reflexes. Normal coordination and gait  ECOG PERFORMANCE STATUS: 0 - Asymptomatic  Blood pressure (!) 152/91, pulse 88, temperature 98 F (36.7 C), temperature source Temporal, resp. rate 17, height 5' 3 (1.6 m), weight 98 lb 8 oz (44.7  kg), SpO2 97%.  LABORATORY DATA: Lab Results  Component Value Date   WBC 6.1 01/28/2024   HGB 14.0 01/28/2024   HCT 41.9 01/28/2024   MCV 91.1 01/28/2024   PLT 224 01/28/2024      Chemistry      Component Value Date/Time   NA 140 01/28/2024 1110   NA 141 05/14/2017 1304   K 4.8 01/28/2024 1110   K 4.3 05/14/2017 1304   CL 104 01/28/2024 1110   CO2 31 01/28/2024 1110   CO2 30 (H) 05/14/2017 1304   BUN 20 01/28/2024 1110   BUN 19.0 05/14/2017 1304   CREATININE 1.10 (H) 04/25/2024 1642   CREATININE 0.87 01/28/2024 1110   CREATININE 0.8 05/14/2017 1304      Component Value Date/Time   CALCIUM 9.4 01/28/2024 1110   CALCIUM 9.1 05/14/2017 1304   ALKPHOS 84 01/28/2024 1110   ALKPHOS 86 05/14/2017  1304   AST 24 01/28/2024 1110   AST 25 05/14/2017 1304   ALT 11 01/28/2024 1110   ALT 15 05/14/2017 1304   BILITOT 0.6 01/28/2024 1110   BILITOT 0.57 05/14/2017 1304       RADIOGRAPHIC STUDIES: CT Chest W Contrast Result Date: 04/27/2024 CLINICAL DATA:  Non-small cell lung cancer (NSCLC), staging. * Tracking Code: BO * EXAM: CT CHEST WITH CONTRAST TECHNIQUE: Multidetector CT imaging of the chest was performed during intravenous contrast administration. RADIATION DOSE REDUCTION: This exam was performed according to the departmental dose-optimization program which includes automated exposure control, adjustment of the mA and/or kV according to patient size and/or use of iterative reconstruction technique. CONTRAST:  60mL OMNIPAQUE  IOHEXOL  300 MG/ML  SOLN COMPARISON:  CT scan chest from 10/23/2023. FINDINGS: Cardiovascular: Mild cardiomegaly. No pericardial effusion. Mild ectasia of the ascending aorta measuring upto 3.9-4.1 cm. There are coronary artery calcifications, in keeping with coronary artery disease. There are also moderate peripheral atherosclerotic vascular calcifications of thoracic aorta and its major branches. Mediastinum/Nodes: There is heterogeneous and asymmetrically enlarged right thyroid  lobe in comparison to the left. This is not well evaluated on the CT scan exam but appears essentially unchanged since the prior study. This nodule meets the criteria for nonemergent ultrasound evaluation. No solid / cystic mediastinal masses. The esophagus is nondistended precluding optimal assessment. No axillary, mediastinal or hilar lymphadenopathy by size criteria. Lungs/Pleura: The central tracheo-bronchial tree is patent. Redemonstration of postsurgical changes from prior right upper lobectomy. Redemonstration of bilateral subpleural reticulations, essentially unchanged. There is small right pleural effusion, increased since the prior study. No left pleural effusion. No mass, consolidation or  pneumothorax on either side. There is a stable, subcentimeter, irregular nodule in the left lung apex. No new or suspicious lung nodules. Upper Abdomen: Visualized upper abdominal viscera within normal limits. Musculoskeletal: The visualized soft tissues of the chest wall are grossly unremarkable. No suspicious osseous lesions. There are mild multilevel degenerative changes in the visualized spine. IMPRESSION: 1. Redemonstration of postsurgical changes from prior right upper lobectomy. No new or suspicious lung nodule. No lung mass, consolidation, pneumothorax or pleural effusion. 2. No new or enlarging lymphadenopathy. 3. Multiple other nonacute observations, as described above. Aortic Atherosclerosis (ICD10-I70.0). Electronically Signed   By: Ree Molt M.D.   On: 04/27/2024 16:11      ASSESSMENT AND PLAN:  This is a very pleasant 89 years old white female with recurrent non-small cell lung cancer, adenocarcinoma with positive EGFR mutation. The patient has been on treatment with Iressa  250 mg p.o. daily for the last 105  months. The patient has been tolerating her treatment with the brand-name Iressa  fairly well but when switched to generic Gefitinib  she has significant issues with mouth sores before switching back to the brand name.   She has been tolerating her treatment fairly well. She had repeat CT scan of the chest performed recently.  I personally and independently reviewed the scan and discussed the result with the patient and her friend today.  Her scan showed no concerning findings for disease progression. Assessment and Plan Assessment & Plan Recurrent EGFR-mutated non-small cell lung adenocarcinoma Her disease remains well-controlled with no evidence of progression on recent chest CT. She continues gefitinib  therapy with manageable side effects and reports no new symptoms. Disease control has been maintained for nearly nine years on current therapy. - Reviewed recent chest CT  demonstrating no progression. - Continued gefitinib  250 mg PO daily. - Obtained laboratory studies to monitor renal function. - Scheduled follow-up in three months with repeat laboratory evaluation. She was advised to call immediately if she has any concerning symptoms in the interval. The patient voices understanding of current disease status and treatment options and is in agreement with the current care plan. All questions were answered. The patient knows to call the clinic with any problems, questions or concerns. We can certainly see the patient much sooner if necessary.  Disclaimer: This note was dictated with voice recognition software. Similar sounding words can inadvertently be transcribed and may not be corrected upon review.

## 2024-05-05 ENCOUNTER — Ambulatory Visit: Attending: Cardiology | Admitting: *Deleted

## 2024-05-05 DIAGNOSIS — I4891 Unspecified atrial fibrillation: Secondary | ICD-10-CM | POA: Diagnosis not present

## 2024-05-05 DIAGNOSIS — Z5181 Encounter for therapeutic drug level monitoring: Secondary | ICD-10-CM

## 2024-05-05 LAB — POCT INR: INR: 2 (ref 2.0–3.0)

## 2024-05-05 NOTE — Progress Notes (Signed)
 INR 2.0 Please see anticoagulation encounter.

## 2024-05-05 NOTE — Patient Instructions (Signed)
 Continue warfarin 1 tablet daily except 1/2 tablet on Thursdays.  Back on brand name Iressa  Continue greens Recheck in 4 wks

## 2024-05-06 ENCOUNTER — Other Ambulatory Visit (INDEPENDENT_AMBULATORY_CARE_PROVIDER_SITE_OTHER): Payer: Self-pay | Admitting: Gastroenterology

## 2024-05-16 ENCOUNTER — Other Ambulatory Visit: Payer: Self-pay | Admitting: Pharmacy Technician

## 2024-05-16 ENCOUNTER — Other Ambulatory Visit (HOSPITAL_COMMUNITY): Payer: Self-pay

## 2024-05-16 ENCOUNTER — Telehealth: Payer: Self-pay

## 2024-05-16 ENCOUNTER — Other Ambulatory Visit: Payer: Self-pay

## 2024-05-16 NOTE — Progress Notes (Signed)
 05-16-24 (AS) Patient has new insurance- Copay on Iressa  is high ($2,095.18)- opened BIV

## 2024-05-16 NOTE — Telephone Encounter (Signed)
 Oral Oncology Patient Advocate Encounter  Was successful in securing patient a $6000 grant from [facility] to provide copayment coverage for Iressa .  This will keep the out of pocket expense at $0.     [employer] ID: [id number]   The billing information is as follows and has been shared with [facility].    RxBin: [id number] PCN: [id number] Member ID: [id number] Group ID: [id number] Dates of Eligibility: 04/16/24 through 04/15/25  Fund:  NSCLC  Lucie Lamer, CPhT [place]  [facility] Oncology Pharmacy Patient Advocate Specialist [facility] Phone: [phone]  Fax: [phone] [email or web address]

## 2024-05-19 ENCOUNTER — Other Ambulatory Visit: Payer: Self-pay

## 2024-05-19 NOTE — Progress Notes (Signed)
 Specialty Pharmacy Refill Coordination Note  MORINE KOHLMAN is a 89 y.o. female contacted today regarding refills of specialty medication(s) Gefitinib  (Iressa )   Patient requested Delivery   Delivery date: 05/22/24   Verified address: [address]  [place]   Medication will be filled on: 05/21/24

## 2024-05-21 ENCOUNTER — Other Ambulatory Visit: Payer: Self-pay

## 2024-06-02 ENCOUNTER — Ambulatory Visit: Attending: Cardiology | Admitting: *Deleted

## 2024-06-02 DIAGNOSIS — Z5181 Encounter for therapeutic drug level monitoring: Secondary | ICD-10-CM

## 2024-06-02 DIAGNOSIS — I4891 Unspecified atrial fibrillation: Secondary | ICD-10-CM

## 2024-06-02 LAB — POCT INR: INR: 2.7 (ref 2.0–3.0)

## 2024-06-02 NOTE — Progress Notes (Signed)
 INR 2.7; Please see anticoagulation encounter

## 2024-06-02 NOTE — Patient Instructions (Signed)
 Continue warfarin 1 tablet daily except 1/2 tablet on Thursdays.  Back on brand name Iressa  Continue greens Recheck in 4 wks

## 2024-06-03 ENCOUNTER — Ambulatory Visit (INDEPENDENT_AMBULATORY_CARE_PROVIDER_SITE_OTHER): Payer: Medicare Other | Admitting: Gastroenterology

## 2024-06-16 ENCOUNTER — Other Ambulatory Visit: Payer: Self-pay | Admitting: Pharmacy Technician

## 2024-06-16 ENCOUNTER — Other Ambulatory Visit: Payer: Self-pay

## 2024-06-16 NOTE — Progress Notes (Signed)
 Specialty Pharmacy Refill Coordination Note  Natalie Carlson is a 89 y.o. female contacted today regarding refills of specialty medication(s) Gefitinib  (Iressa )   Patient requested Delivery   Delivery date: 06/19/24   Verified address: [address] [place] [address]   Medication will be filled on: 06/18/24

## 2024-06-18 ENCOUNTER — Other Ambulatory Visit: Payer: Self-pay

## 2024-06-30 ENCOUNTER — Ambulatory Visit

## 2024-07-08 ENCOUNTER — Ambulatory Visit (INDEPENDENT_AMBULATORY_CARE_PROVIDER_SITE_OTHER): Admitting: Gastroenterology

## 2024-07-10 ENCOUNTER — Ambulatory Visit (INDEPENDENT_AMBULATORY_CARE_PROVIDER_SITE_OTHER): Admitting: Gastroenterology

## 2024-07-28 ENCOUNTER — Inpatient Hospital Stay: Attending: Physician Assistant

## 2024-07-28 ENCOUNTER — Inpatient Hospital Stay: Admitting: Internal Medicine
